# Patient Record
Sex: Female | Born: 1955
Health system: Southern US, Community
[De-identification: ages and names within clinical notes are randomized; demographics above are authoritative.]

## PROBLEM LIST (undated history)

## (undated) DIAGNOSIS — N182 Chronic kidney disease, stage 2 (mild): Secondary | ICD-10-CM

## (undated) DIAGNOSIS — I2510 Atherosclerotic heart disease of native coronary artery without angina pectoris: Secondary | ICD-10-CM

## (undated) DIAGNOSIS — E079 Disorder of thyroid, unspecified: Secondary | ICD-10-CM

## (undated) DIAGNOSIS — F17200 Nicotine dependence, unspecified, uncomplicated: Secondary | ICD-10-CM

## (undated) DIAGNOSIS — I219 Acute myocardial infarction, unspecified: Secondary | ICD-10-CM

## (undated) DIAGNOSIS — E2749 Other adrenocortical insufficiency: Secondary | ICD-10-CM

## (undated) DIAGNOSIS — E119 Type 2 diabetes mellitus without complications: Secondary | ICD-10-CM

## (undated) DIAGNOSIS — Z9981 Dependence on supplemental oxygen: Secondary | ICD-10-CM

## (undated) DIAGNOSIS — I509 Heart failure, unspecified: Secondary | ICD-10-CM

## (undated) DIAGNOSIS — J449 Chronic obstructive pulmonary disease, unspecified: Secondary | ICD-10-CM

## (undated) DIAGNOSIS — M1990 Unspecified osteoarthritis, unspecified site: Secondary | ICD-10-CM

## (undated) DIAGNOSIS — I10 Essential (primary) hypertension: Secondary | ICD-10-CM

## (undated) DIAGNOSIS — I5023 Acute on chronic systolic (congestive) heart failure: Secondary | ICD-10-CM

## (undated) DIAGNOSIS — E785 Hyperlipidemia, unspecified: Secondary | ICD-10-CM

## (undated) DIAGNOSIS — Z9289 Personal history of other medical treatment: Secondary | ICD-10-CM

## (undated) DIAGNOSIS — Z720 Tobacco use: Secondary | ICD-10-CM

## (undated) DIAGNOSIS — M3500 Sicca syndrome, unspecified: Secondary | ICD-10-CM

## (undated) DIAGNOSIS — E063 Autoimmune thyroiditis: Secondary | ICD-10-CM

## (undated) DIAGNOSIS — I5042 Chronic combined systolic (congestive) and diastolic (congestive) heart failure: Secondary | ICD-10-CM

## (undated) HISTORY — PX: SHOULDER ARTHROSCOPY: SHX128

## (undated) HISTORY — DX: Type 2 diabetes mellitus without complications: E11.9

## (undated) HISTORY — PX: HERNIA REPAIR: SHX51

## (undated) HISTORY — PX: ABDOMINAL SURGERY: SHX537

## (undated) HISTORY — DX: Disorder of thyroid, unspecified: E07.9

## (undated) HISTORY — DX: Hyperlipidemia, unspecified: E78.5

## (undated) HISTORY — PX: HERNIA MESH REMOVAL: SHX1745

## (undated) HISTORY — PX: COLON RESECTION: SHX5231

## (undated) HISTORY — PX: TUBAL LIGATION: SHX77

## (undated) HISTORY — DX: Essential (primary) hypertension: I10

## (undated) HISTORY — PX: CHOLECYSTECTOMY: SHX55

## (undated) HISTORY — DX: Personal history of other medical treatment: Z92.89

---

## 2008-01-11 DIAGNOSIS — IMO0002 Reserved for concepts with insufficient information to code with codable children: Secondary | ICD-10-CM | POA: Insufficient documentation

## 2008-01-11 DIAGNOSIS — M5137 Other intervertebral disc degeneration, lumbosacral region: Secondary | ICD-10-CM | POA: Insufficient documentation

## 2011-04-12 DIAGNOSIS — E2749 Other adrenocortical insufficiency: Secondary | ICD-10-CM | POA: Insufficient documentation

## 2013-06-14 DIAGNOSIS — G3184 Mild cognitive impairment, so stated: Secondary | ICD-10-CM | POA: Insufficient documentation

## 2013-06-14 DIAGNOSIS — G43019 Migraine without aura, intractable, without status migrainosus: Secondary | ICD-10-CM | POA: Insufficient documentation

## 2013-06-14 DIAGNOSIS — E1142 Type 2 diabetes mellitus with diabetic polyneuropathy: Secondary | ICD-10-CM | POA: Insufficient documentation

## 2013-06-14 DIAGNOSIS — M5126 Other intervertebral disc displacement, lumbar region: Secondary | ICD-10-CM | POA: Insufficient documentation

## 2013-07-26 ENCOUNTER — Encounter: Payer: Self-pay | Admitting: Gastroenterology

## 2013-09-17 DIAGNOSIS — F339 Major depressive disorder, recurrent, unspecified: Secondary | ICD-10-CM | POA: Insufficient documentation

## 2013-09-20 DIAGNOSIS — F17200 Nicotine dependence, unspecified, uncomplicated: Secondary | ICD-10-CM | POA: Insufficient documentation

## 2013-11-21 DIAGNOSIS — R0602 Shortness of breath: Secondary | ICD-10-CM | POA: Insufficient documentation

## 2013-11-21 DIAGNOSIS — M7989 Other specified soft tissue disorders: Secondary | ICD-10-CM | POA: Insufficient documentation

## 2013-11-21 DIAGNOSIS — E785 Hyperlipidemia, unspecified: Secondary | ICD-10-CM | POA: Insufficient documentation

## 2013-11-21 DIAGNOSIS — E236 Other disorders of pituitary gland: Secondary | ICD-10-CM | POA: Insufficient documentation

## 2013-11-21 DIAGNOSIS — M3500 Sicca syndrome, unspecified: Secondary | ICD-10-CM | POA: Insufficient documentation

## 2013-11-21 DIAGNOSIS — I429 Cardiomyopathy, unspecified: Secondary | ICD-10-CM | POA: Insufficient documentation

## 2013-11-21 DIAGNOSIS — D649 Anemia, unspecified: Secondary | ICD-10-CM | POA: Insufficient documentation

## 2013-11-21 DIAGNOSIS — K759 Inflammatory liver disease, unspecified: Secondary | ICD-10-CM | POA: Insufficient documentation

## 2013-11-21 DIAGNOSIS — E119 Type 2 diabetes mellitus without complications: Secondary | ICD-10-CM | POA: Insufficient documentation

## 2013-11-21 DIAGNOSIS — N289 Disorder of kidney and ureter, unspecified: Secondary | ICD-10-CM | POA: Insufficient documentation

## 2013-11-21 DIAGNOSIS — I10 Essential (primary) hypertension: Secondary | ICD-10-CM | POA: Insufficient documentation

## 2013-11-21 DIAGNOSIS — I509 Heart failure, unspecified: Secondary | ICD-10-CM | POA: Insufficient documentation

## 2013-11-21 DIAGNOSIS — N281 Cyst of kidney, acquired: Secondary | ICD-10-CM | POA: Insufficient documentation

## 2013-11-21 DIAGNOSIS — J439 Emphysema, unspecified: Secondary | ICD-10-CM | POA: Insufficient documentation

## 2013-11-21 DIAGNOSIS — E2740 Unspecified adrenocortical insufficiency: Secondary | ICD-10-CM | POA: Insufficient documentation

## 2013-11-21 DIAGNOSIS — R599 Enlarged lymph nodes, unspecified: Secondary | ICD-10-CM | POA: Insufficient documentation

## 2013-11-21 DIAGNOSIS — E079 Disorder of thyroid, unspecified: Secondary | ICD-10-CM | POA: Insufficient documentation

## 2013-11-21 DIAGNOSIS — R609 Edema, unspecified: Secondary | ICD-10-CM | POA: Insufficient documentation

## 2013-11-21 DIAGNOSIS — I200 Unstable angina: Secondary | ICD-10-CM | POA: Insufficient documentation

## 2013-11-21 DIAGNOSIS — I2510 Atherosclerotic heart disease of native coronary artery without angina pectoris: Secondary | ICD-10-CM | POA: Insufficient documentation

## 2014-10-16 DIAGNOSIS — Z955 Presence of coronary angioplasty implant and graft: Secondary | ICD-10-CM | POA: Insufficient documentation

## 2015-01-15 DIAGNOSIS — K430 Incisional hernia with obstruction, without gangrene: Secondary | ICD-10-CM | POA: Insufficient documentation

## 2015-01-15 DIAGNOSIS — G4730 Sleep apnea, unspecified: Secondary | ICD-10-CM | POA: Insufficient documentation

## 2015-01-18 DIAGNOSIS — K460 Unspecified abdominal hernia with obstruction, without gangrene: Secondary | ICD-10-CM | POA: Insufficient documentation

## 2015-01-18 DIAGNOSIS — E875 Hyperkalemia: Secondary | ICD-10-CM | POA: Insufficient documentation

## 2015-01-18 DIAGNOSIS — Z720 Tobacco use: Secondary | ICD-10-CM | POA: Insufficient documentation

## 2015-01-29 DIAGNOSIS — T8131XA Disruption of external operation (surgical) wound, not elsewhere classified, initial encounter: Secondary | ICD-10-CM | POA: Insufficient documentation

## 2015-01-29 DIAGNOSIS — Z9889 Other specified postprocedural states: Secondary | ICD-10-CM

## 2015-01-29 DIAGNOSIS — Z8719 Personal history of other diseases of the digestive system: Secondary | ICD-10-CM | POA: Insufficient documentation

## 2015-05-22 DIAGNOSIS — E039 Hypothyroidism, unspecified: Secondary | ICD-10-CM | POA: Insufficient documentation

## 2015-05-22 DIAGNOSIS — E876 Hypokalemia: Secondary | ICD-10-CM | POA: Insufficient documentation

## 2015-05-22 DIAGNOSIS — R079 Chest pain, unspecified: Secondary | ICD-10-CM | POA: Insufficient documentation

## 2015-06-25 DIAGNOSIS — J449 Chronic obstructive pulmonary disease, unspecified: Secondary | ICD-10-CM | POA: Diagnosis not present

## 2015-06-30 DIAGNOSIS — N200 Calculus of kidney: Secondary | ICD-10-CM | POA: Diagnosis not present

## 2015-06-30 DIAGNOSIS — R109 Unspecified abdominal pain: Secondary | ICD-10-CM | POA: Diagnosis not present

## 2015-06-30 DIAGNOSIS — Z9889 Other specified postprocedural states: Secondary | ICD-10-CM | POA: Diagnosis not present

## 2015-07-02 DIAGNOSIS — E1159 Type 2 diabetes mellitus with other circulatory complications: Secondary | ICD-10-CM | POA: Diagnosis not present

## 2015-07-02 DIAGNOSIS — E785 Hyperlipidemia, unspecified: Secondary | ICD-10-CM | POA: Diagnosis not present

## 2015-07-02 DIAGNOSIS — I10 Essential (primary) hypertension: Secondary | ICD-10-CM | POA: Diagnosis not present

## 2015-07-02 DIAGNOSIS — E039 Hypothyroidism, unspecified: Secondary | ICD-10-CM | POA: Diagnosis not present

## 2015-07-02 DIAGNOSIS — E119 Type 2 diabetes mellitus without complications: Secondary | ICD-10-CM | POA: Diagnosis not present

## 2015-07-15 DIAGNOSIS — T8189XD Other complications of procedures, not elsewhere classified, subsequent encounter: Secondary | ICD-10-CM | POA: Diagnosis not present

## 2015-07-15 DIAGNOSIS — Z6827 Body mass index (BMI) 27.0-27.9, adult: Secondary | ICD-10-CM | POA: Diagnosis not present

## 2015-07-15 DIAGNOSIS — I209 Angina pectoris, unspecified: Secondary | ICD-10-CM | POA: Diagnosis not present

## 2015-07-15 DIAGNOSIS — Z9889 Other specified postprocedural states: Secondary | ICD-10-CM | POA: Diagnosis not present

## 2015-07-15 DIAGNOSIS — I10 Essential (primary) hypertension: Secondary | ICD-10-CM | POA: Diagnosis not present

## 2015-07-15 DIAGNOSIS — I2510 Atherosclerotic heart disease of native coronary artery without angina pectoris: Secondary | ICD-10-CM | POA: Diagnosis not present

## 2015-07-15 DIAGNOSIS — Z8719 Personal history of other diseases of the digestive system: Secondary | ICD-10-CM | POA: Diagnosis not present

## 2015-07-17 DIAGNOSIS — J310 Chronic rhinitis: Secondary | ICD-10-CM | POA: Diagnosis not present

## 2015-07-26 DIAGNOSIS — J449 Chronic obstructive pulmonary disease, unspecified: Secondary | ICD-10-CM | POA: Diagnosis not present

## 2015-08-11 DIAGNOSIS — E119 Type 2 diabetes mellitus without complications: Secondary | ICD-10-CM | POA: Diagnosis not present

## 2015-08-14 DIAGNOSIS — J310 Chronic rhinitis: Secondary | ICD-10-CM | POA: Diagnosis not present

## 2015-08-25 DIAGNOSIS — J449 Chronic obstructive pulmonary disease, unspecified: Secondary | ICD-10-CM | POA: Diagnosis not present

## 2015-08-28 DIAGNOSIS — G4733 Obstructive sleep apnea (adult) (pediatric): Secondary | ICD-10-CM | POA: Diagnosis not present

## 2015-09-03 DIAGNOSIS — Z5181 Encounter for therapeutic drug level monitoring: Secondary | ICD-10-CM | POA: Diagnosis not present

## 2015-09-03 DIAGNOSIS — Z79899 Other long term (current) drug therapy: Secondary | ICD-10-CM | POA: Diagnosis not present

## 2015-09-03 DIAGNOSIS — R0602 Shortness of breath: Secondary | ICD-10-CM | POA: Diagnosis not present

## 2015-09-03 DIAGNOSIS — R918 Other nonspecific abnormal finding of lung field: Secondary | ICD-10-CM | POA: Diagnosis not present

## 2015-09-03 DIAGNOSIS — R600 Localized edema: Secondary | ICD-10-CM | POA: Diagnosis not present

## 2015-09-03 DIAGNOSIS — D649 Anemia, unspecified: Secondary | ICD-10-CM | POA: Diagnosis not present

## 2015-09-03 DIAGNOSIS — F329 Major depressive disorder, single episode, unspecified: Secondary | ICD-10-CM | POA: Diagnosis not present

## 2015-09-03 DIAGNOSIS — R0789 Other chest pain: Secondary | ICD-10-CM | POA: Diagnosis not present

## 2015-09-03 DIAGNOSIS — F4321 Adjustment disorder with depressed mood: Secondary | ICD-10-CM | POA: Diagnosis not present

## 2015-09-03 DIAGNOSIS — I10 Essential (primary) hypertension: Secondary | ICD-10-CM | POA: Diagnosis not present

## 2015-09-04 DIAGNOSIS — D649 Anemia, unspecified: Secondary | ICD-10-CM | POA: Diagnosis not present

## 2015-09-04 DIAGNOSIS — F329 Major depressive disorder, single episode, unspecified: Secondary | ICD-10-CM | POA: Diagnosis not present

## 2015-09-04 DIAGNOSIS — R0602 Shortness of breath: Secondary | ICD-10-CM | POA: Diagnosis not present

## 2015-09-04 DIAGNOSIS — R0789 Other chest pain: Secondary | ICD-10-CM | POA: Diagnosis not present

## 2015-09-04 DIAGNOSIS — I10 Essential (primary) hypertension: Secondary | ICD-10-CM | POA: Diagnosis not present

## 2015-09-14 DIAGNOSIS — E1165 Type 2 diabetes mellitus with hyperglycemia: Secondary | ICD-10-CM | POA: Diagnosis not present

## 2015-09-14 DIAGNOSIS — R079 Chest pain, unspecified: Secondary | ICD-10-CM | POA: Diagnosis not present

## 2015-09-14 DIAGNOSIS — D649 Anemia, unspecified: Secondary | ICD-10-CM | POA: Diagnosis not present

## 2015-09-14 DIAGNOSIS — R609 Edema, unspecified: Secondary | ICD-10-CM | POA: Diagnosis not present

## 2015-09-14 DIAGNOSIS — K648 Other hemorrhoids: Secondary | ICD-10-CM | POA: Diagnosis not present

## 2015-09-14 DIAGNOSIS — M3500 Sicca syndrome, unspecified: Secondary | ICD-10-CM | POA: Diagnosis not present

## 2015-09-14 DIAGNOSIS — R072 Precordial pain: Secondary | ICD-10-CM | POA: Diagnosis not present

## 2015-09-14 DIAGNOSIS — E271 Primary adrenocortical insufficiency: Secondary | ICD-10-CM | POA: Diagnosis not present

## 2015-09-14 DIAGNOSIS — E2740 Unspecified adrenocortical insufficiency: Secondary | ICD-10-CM | POA: Diagnosis not present

## 2015-09-14 DIAGNOSIS — R0789 Other chest pain: Secondary | ICD-10-CM | POA: Diagnosis not present

## 2015-09-14 DIAGNOSIS — I2510 Atherosclerotic heart disease of native coronary artery without angina pectoris: Secondary | ICD-10-CM | POA: Diagnosis not present

## 2015-09-15 DIAGNOSIS — R079 Chest pain, unspecified: Secondary | ICD-10-CM | POA: Diagnosis not present

## 2015-09-15 DIAGNOSIS — I10 Essential (primary) hypertension: Secondary | ICD-10-CM | POA: Diagnosis not present

## 2015-09-15 DIAGNOSIS — I2510 Atherosclerotic heart disease of native coronary artery without angina pectoris: Secondary | ICD-10-CM | POA: Diagnosis not present

## 2015-09-15 DIAGNOSIS — R072 Precordial pain: Secondary | ICD-10-CM | POA: Diagnosis not present

## 2015-09-15 DIAGNOSIS — Z6829 Body mass index (BMI) 29.0-29.9, adult: Secondary | ICD-10-CM | POA: Diagnosis not present

## 2015-09-15 DIAGNOSIS — E271 Primary adrenocortical insufficiency: Secondary | ICD-10-CM | POA: Diagnosis not present

## 2015-09-15 DIAGNOSIS — K648 Other hemorrhoids: Secondary | ICD-10-CM | POA: Diagnosis not present

## 2015-09-15 DIAGNOSIS — I209 Angina pectoris, unspecified: Secondary | ICD-10-CM | POA: Diagnosis not present

## 2015-09-15 DIAGNOSIS — Z955 Presence of coronary angioplasty implant and graft: Secondary | ICD-10-CM | POA: Diagnosis not present

## 2015-09-16 DIAGNOSIS — E1165 Type 2 diabetes mellitus with hyperglycemia: Secondary | ICD-10-CM | POA: Diagnosis not present

## 2015-09-16 DIAGNOSIS — K5730 Diverticulosis of large intestine without perforation or abscess without bleeding: Secondary | ICD-10-CM | POA: Diagnosis not present

## 2015-09-16 DIAGNOSIS — K922 Gastrointestinal hemorrhage, unspecified: Secondary | ICD-10-CM | POA: Insufficient documentation

## 2015-09-16 DIAGNOSIS — K648 Other hemorrhoids: Secondary | ICD-10-CM | POA: Diagnosis not present

## 2015-09-16 DIAGNOSIS — D500 Iron deficiency anemia secondary to blood loss (chronic): Secondary | ICD-10-CM | POA: Diagnosis not present

## 2015-09-16 DIAGNOSIS — F329 Major depressive disorder, single episode, unspecified: Secondary | ICD-10-CM | POA: Diagnosis not present

## 2015-09-16 DIAGNOSIS — R072 Precordial pain: Secondary | ICD-10-CM | POA: Diagnosis not present

## 2015-09-16 DIAGNOSIS — D509 Iron deficiency anemia, unspecified: Secondary | ICD-10-CM | POA: Diagnosis not present

## 2015-09-16 DIAGNOSIS — Z6830 Body mass index (BMI) 30.0-30.9, adult: Secondary | ICD-10-CM | POA: Diagnosis not present

## 2015-09-16 DIAGNOSIS — G4733 Obstructive sleep apnea (adult) (pediatric): Secondary | ICD-10-CM | POA: Diagnosis not present

## 2015-09-16 DIAGNOSIS — E2740 Unspecified adrenocortical insufficiency: Secondary | ICD-10-CM | POA: Diagnosis not present

## 2015-09-16 DIAGNOSIS — E785 Hyperlipidemia, unspecified: Secondary | ICD-10-CM | POA: Diagnosis not present

## 2015-09-16 DIAGNOSIS — I110 Hypertensive heart disease with heart failure: Secondary | ICD-10-CM | POA: Diagnosis not present

## 2015-09-16 DIAGNOSIS — E271 Primary adrenocortical insufficiency: Secondary | ICD-10-CM | POA: Diagnosis not present

## 2015-09-16 DIAGNOSIS — I252 Old myocardial infarction: Secondary | ICD-10-CM | POA: Diagnosis not present

## 2015-09-16 DIAGNOSIS — Z8701 Personal history of pneumonia (recurrent): Secondary | ICD-10-CM | POA: Diagnosis not present

## 2015-09-16 DIAGNOSIS — Z8673 Personal history of transient ischemic attack (TIA), and cerebral infarction without residual deficits: Secondary | ICD-10-CM | POA: Diagnosis not present

## 2015-09-16 DIAGNOSIS — I209 Angina pectoris, unspecified: Secondary | ICD-10-CM | POA: Diagnosis not present

## 2015-09-16 DIAGNOSIS — Z9981 Dependence on supplemental oxygen: Secondary | ICD-10-CM | POA: Diagnosis not present

## 2015-09-16 DIAGNOSIS — K625 Hemorrhage of anus and rectum: Secondary | ICD-10-CM | POA: Diagnosis not present

## 2015-09-16 DIAGNOSIS — Z955 Presence of coronary angioplasty implant and graft: Secondary | ICD-10-CM | POA: Diagnosis not present

## 2015-09-16 DIAGNOSIS — R0789 Other chest pain: Secondary | ICD-10-CM | POA: Diagnosis not present

## 2015-09-16 DIAGNOSIS — J449 Chronic obstructive pulmonary disease, unspecified: Secondary | ICD-10-CM | POA: Diagnosis not present

## 2015-09-16 DIAGNOSIS — F17210 Nicotine dependence, cigarettes, uncomplicated: Secondary | ICD-10-CM | POA: Diagnosis not present

## 2015-09-16 DIAGNOSIS — K644 Residual hemorrhoidal skin tags: Secondary | ICD-10-CM | POA: Diagnosis not present

## 2015-09-17 DIAGNOSIS — R072 Precordial pain: Secondary | ICD-10-CM | POA: Diagnosis not present

## 2015-09-17 DIAGNOSIS — E271 Primary adrenocortical insufficiency: Secondary | ICD-10-CM | POA: Diagnosis not present

## 2015-09-17 DIAGNOSIS — K648 Other hemorrhoids: Secondary | ICD-10-CM | POA: Diagnosis not present

## 2015-09-17 DIAGNOSIS — D509 Iron deficiency anemia, unspecified: Secondary | ICD-10-CM | POA: Diagnosis not present

## 2015-09-17 DIAGNOSIS — R0789 Other chest pain: Secondary | ICD-10-CM | POA: Diagnosis not present

## 2015-09-17 DIAGNOSIS — D500 Iron deficiency anemia secondary to blood loss (chronic): Secondary | ICD-10-CM | POA: Diagnosis not present

## 2015-09-17 DIAGNOSIS — Z6830 Body mass index (BMI) 30.0-30.9, adult: Secondary | ICD-10-CM | POA: Diagnosis not present

## 2015-09-22 DIAGNOSIS — K625 Hemorrhage of anus and rectum: Secondary | ICD-10-CM | POA: Diagnosis not present

## 2015-09-22 DIAGNOSIS — I10 Essential (primary) hypertension: Secondary | ICD-10-CM | POA: Diagnosis not present

## 2015-09-22 DIAGNOSIS — I209 Angina pectoris, unspecified: Secondary | ICD-10-CM | POA: Diagnosis not present

## 2015-09-22 DIAGNOSIS — R0602 Shortness of breath: Secondary | ICD-10-CM | POA: Diagnosis not present

## 2015-09-23 DIAGNOSIS — Z1389 Encounter for screening for other disorder: Secondary | ICD-10-CM | POA: Diagnosis not present

## 2015-09-23 DIAGNOSIS — I110 Hypertensive heart disease with heart failure: Secondary | ICD-10-CM | POA: Diagnosis not present

## 2015-09-23 DIAGNOSIS — D649 Anemia, unspecified: Secondary | ICD-10-CM | POA: Diagnosis not present

## 2015-09-23 DIAGNOSIS — E538 Deficiency of other specified B group vitamins: Secondary | ICD-10-CM | POA: Diagnosis not present

## 2015-09-25 DIAGNOSIS — J449 Chronic obstructive pulmonary disease, unspecified: Secondary | ICD-10-CM | POA: Diagnosis not present

## 2015-09-30 DIAGNOSIS — R110 Nausea: Secondary | ICD-10-CM | POA: Diagnosis not present

## 2015-09-30 DIAGNOSIS — I110 Hypertensive heart disease with heart failure: Secondary | ICD-10-CM | POA: Diagnosis not present

## 2015-09-30 DIAGNOSIS — Z6829 Body mass index (BMI) 29.0-29.9, adult: Secondary | ICD-10-CM | POA: Diagnosis not present

## 2015-09-30 DIAGNOSIS — D649 Anemia, unspecified: Secondary | ICD-10-CM | POA: Diagnosis not present

## 2015-10-05 DIAGNOSIS — D509 Iron deficiency anemia, unspecified: Secondary | ICD-10-CM | POA: Diagnosis not present

## 2015-10-08 DIAGNOSIS — E1159 Type 2 diabetes mellitus with other circulatory complications: Secondary | ICD-10-CM | POA: Diagnosis not present

## 2015-10-08 DIAGNOSIS — E119 Type 2 diabetes mellitus without complications: Secondary | ICD-10-CM | POA: Diagnosis not present

## 2015-10-08 DIAGNOSIS — E039 Hypothyroidism, unspecified: Secondary | ICD-10-CM | POA: Diagnosis not present

## 2015-10-08 DIAGNOSIS — E785 Hyperlipidemia, unspecified: Secondary | ICD-10-CM | POA: Diagnosis not present

## 2015-10-12 DIAGNOSIS — D509 Iron deficiency anemia, unspecified: Secondary | ICD-10-CM | POA: Diagnosis not present

## 2015-10-15 DIAGNOSIS — I10 Essential (primary) hypertension: Secondary | ICD-10-CM | POA: Diagnosis not present

## 2015-10-15 DIAGNOSIS — E1159 Type 2 diabetes mellitus with other circulatory complications: Secondary | ICD-10-CM | POA: Diagnosis not present

## 2015-10-15 DIAGNOSIS — E785 Hyperlipidemia, unspecified: Secondary | ICD-10-CM | POA: Diagnosis not present

## 2015-10-15 DIAGNOSIS — E119 Type 2 diabetes mellitus without complications: Secondary | ICD-10-CM | POA: Diagnosis not present

## 2015-10-21 DIAGNOSIS — E119 Type 2 diabetes mellitus without complications: Secondary | ICD-10-CM | POA: Diagnosis not present

## 2015-10-21 DIAGNOSIS — E785 Hyperlipidemia, unspecified: Secondary | ICD-10-CM | POA: Diagnosis not present

## 2015-10-21 DIAGNOSIS — E039 Hypothyroidism, unspecified: Secondary | ICD-10-CM | POA: Diagnosis not present

## 2015-10-21 DIAGNOSIS — E1159 Type 2 diabetes mellitus with other circulatory complications: Secondary | ICD-10-CM | POA: Diagnosis not present

## 2015-10-22 DIAGNOSIS — E063 Autoimmune thyroiditis: Secondary | ICD-10-CM | POA: Diagnosis not present

## 2015-10-22 DIAGNOSIS — G43019 Migraine without aura, intractable, without status migrainosus: Secondary | ICD-10-CM | POA: Diagnosis not present

## 2015-10-22 DIAGNOSIS — R6889 Other general symptoms and signs: Secondary | ICD-10-CM | POA: Diagnosis not present

## 2015-10-24 DIAGNOSIS — R42 Dizziness and giddiness: Secondary | ICD-10-CM | POA: Diagnosis not present

## 2015-10-24 DIAGNOSIS — I509 Heart failure, unspecified: Secondary | ICD-10-CM | POA: Diagnosis not present

## 2015-10-24 DIAGNOSIS — I10 Essential (primary) hypertension: Secondary | ICD-10-CM | POA: Diagnosis not present

## 2015-10-24 DIAGNOSIS — J449 Chronic obstructive pulmonary disease, unspecified: Secondary | ICD-10-CM | POA: Diagnosis not present

## 2015-10-24 DIAGNOSIS — I429 Cardiomyopathy, unspecified: Secondary | ICD-10-CM | POA: Diagnosis not present

## 2015-10-24 DIAGNOSIS — I2510 Atherosclerotic heart disease of native coronary artery without angina pectoris: Secondary | ICD-10-CM | POA: Diagnosis not present

## 2015-10-24 DIAGNOSIS — R110 Nausea: Secondary | ICD-10-CM | POA: Diagnosis not present

## 2015-10-24 DIAGNOSIS — I252 Old myocardial infarction: Secondary | ICD-10-CM | POA: Diagnosis not present

## 2015-10-24 DIAGNOSIS — E785 Hyperlipidemia, unspecified: Secondary | ICD-10-CM | POA: Diagnosis not present

## 2015-10-24 DIAGNOSIS — Z8673 Personal history of transient ischemic attack (TIA), and cerebral infarction without residual deficits: Secondary | ICD-10-CM | POA: Diagnosis not present

## 2015-10-24 DIAGNOSIS — F17210 Nicotine dependence, cigarettes, uncomplicated: Secondary | ICD-10-CM | POA: Diagnosis not present

## 2015-10-24 DIAGNOSIS — Z8249 Family history of ischemic heart disease and other diseases of the circulatory system: Secondary | ICD-10-CM | POA: Diagnosis not present

## 2015-10-24 DIAGNOSIS — E119 Type 2 diabetes mellitus without complications: Secondary | ICD-10-CM | POA: Diagnosis not present

## 2015-10-24 DIAGNOSIS — Z9981 Dependence on supplemental oxygen: Secondary | ICD-10-CM | POA: Diagnosis not present

## 2015-10-24 DIAGNOSIS — R413 Other amnesia: Secondary | ICD-10-CM | POA: Diagnosis not present

## 2015-10-24 DIAGNOSIS — D649 Anemia, unspecified: Secondary | ICD-10-CM | POA: Diagnosis not present

## 2015-10-24 DIAGNOSIS — R0602 Shortness of breath: Secondary | ICD-10-CM | POA: Diagnosis not present

## 2015-10-24 DIAGNOSIS — M791 Myalgia: Secondary | ICD-10-CM | POA: Diagnosis not present

## 2015-10-24 DIAGNOSIS — Z955 Presence of coronary angioplasty implant and graft: Secondary | ICD-10-CM | POA: Diagnosis not present

## 2015-10-25 DIAGNOSIS — J449 Chronic obstructive pulmonary disease, unspecified: Secondary | ICD-10-CM | POA: Diagnosis not present

## 2015-10-27 DIAGNOSIS — I110 Hypertensive heart disease with heart failure: Secondary | ICD-10-CM | POA: Diagnosis not present

## 2015-10-27 DIAGNOSIS — E1159 Type 2 diabetes mellitus with other circulatory complications: Secondary | ICD-10-CM | POA: Diagnosis not present

## 2015-10-27 DIAGNOSIS — I10 Essential (primary) hypertension: Secondary | ICD-10-CM | POA: Diagnosis not present

## 2015-10-27 DIAGNOSIS — E039 Hypothyroidism, unspecified: Secondary | ICD-10-CM | POA: Diagnosis not present

## 2015-10-28 DIAGNOSIS — Z8719 Personal history of other diseases of the digestive system: Secondary | ICD-10-CM | POA: Diagnosis not present

## 2015-10-28 DIAGNOSIS — R1901 Right upper quadrant abdominal swelling, mass and lump: Secondary | ICD-10-CM | POA: Diagnosis not present

## 2015-10-28 DIAGNOSIS — Z9889 Other specified postprocedural states: Secondary | ICD-10-CM | POA: Diagnosis not present

## 2015-10-28 DIAGNOSIS — T8189XD Other complications of procedures, not elsewhere classified, subsequent encounter: Secondary | ICD-10-CM | POA: Diagnosis not present

## 2015-10-30 DIAGNOSIS — R1909 Other intra-abdominal and pelvic swelling, mass and lump: Secondary | ICD-10-CM | POA: Diagnosis not present

## 2015-10-30 DIAGNOSIS — R1901 Right upper quadrant abdominal swelling, mass and lump: Secondary | ICD-10-CM | POA: Diagnosis not present

## 2015-10-30 DIAGNOSIS — Z9049 Acquired absence of other specified parts of digestive tract: Secondary | ICD-10-CM | POA: Diagnosis not present

## 2015-10-30 DIAGNOSIS — D1803 Hemangioma of intra-abdominal structures: Secondary | ICD-10-CM | POA: Diagnosis not present

## 2015-10-30 DIAGNOSIS — N200 Calculus of kidney: Secondary | ICD-10-CM | POA: Diagnosis not present

## 2015-10-30 DIAGNOSIS — Z9889 Other specified postprocedural states: Secondary | ICD-10-CM | POA: Diagnosis not present

## 2015-11-02 DIAGNOSIS — Z9889 Other specified postprocedural states: Secondary | ICD-10-CM | POA: Diagnosis not present

## 2015-11-02 DIAGNOSIS — Z6831 Body mass index (BMI) 31.0-31.9, adult: Secondary | ICD-10-CM | POA: Diagnosis not present

## 2015-11-02 DIAGNOSIS — R1901 Right upper quadrant abdominal swelling, mass and lump: Secondary | ICD-10-CM | POA: Diagnosis not present

## 2015-11-02 DIAGNOSIS — Z8719 Personal history of other diseases of the digestive system: Secondary | ICD-10-CM | POA: Diagnosis not present

## 2015-11-04 DIAGNOSIS — R404 Transient alteration of awareness: Secondary | ICD-10-CM | POA: Diagnosis not present

## 2015-11-04 DIAGNOSIS — R6889 Other general symptoms and signs: Secondary | ICD-10-CM | POA: Diagnosis not present

## 2015-11-10 DIAGNOSIS — D649 Anemia, unspecified: Secondary | ICD-10-CM | POA: Diagnosis not present

## 2015-11-11 DIAGNOSIS — E1159 Type 2 diabetes mellitus with other circulatory complications: Secondary | ICD-10-CM | POA: Diagnosis not present

## 2015-11-11 DIAGNOSIS — D649 Anemia, unspecified: Secondary | ICD-10-CM | POA: Diagnosis not present

## 2015-11-13 DIAGNOSIS — J310 Chronic rhinitis: Secondary | ICD-10-CM | POA: Diagnosis not present

## 2015-11-17 DIAGNOSIS — D649 Anemia, unspecified: Secondary | ICD-10-CM | POA: Diagnosis not present

## 2015-11-17 DIAGNOSIS — Z6832 Body mass index (BMI) 32.0-32.9, adult: Secondary | ICD-10-CM | POA: Diagnosis not present

## 2015-11-17 DIAGNOSIS — I10 Essential (primary) hypertension: Secondary | ICD-10-CM | POA: Diagnosis not present

## 2015-11-17 DIAGNOSIS — E1159 Type 2 diabetes mellitus with other circulatory complications: Secondary | ICD-10-CM | POA: Diagnosis not present

## 2015-11-25 DIAGNOSIS — J449 Chronic obstructive pulmonary disease, unspecified: Secondary | ICD-10-CM | POA: Diagnosis not present

## 2015-11-27 DIAGNOSIS — G4733 Obstructive sleep apnea (adult) (pediatric): Secondary | ICD-10-CM | POA: Diagnosis not present

## 2015-11-27 DIAGNOSIS — J449 Chronic obstructive pulmonary disease, unspecified: Secondary | ICD-10-CM | POA: Diagnosis not present

## 2015-11-27 DIAGNOSIS — J310 Chronic rhinitis: Secondary | ICD-10-CM | POA: Diagnosis not present

## 2015-11-27 DIAGNOSIS — R911 Solitary pulmonary nodule: Secondary | ICD-10-CM | POA: Diagnosis not present

## 2015-12-01 DIAGNOSIS — Z6834 Body mass index (BMI) 34.0-34.9, adult: Secondary | ICD-10-CM | POA: Diagnosis not present

## 2015-12-01 DIAGNOSIS — I25119 Atherosclerotic heart disease of native coronary artery with unspecified angina pectoris: Secondary | ICD-10-CM | POA: Diagnosis not present

## 2015-12-01 DIAGNOSIS — R079 Chest pain, unspecified: Secondary | ICD-10-CM | POA: Diagnosis not present

## 2015-12-01 DIAGNOSIS — R0602 Shortness of breath: Secondary | ICD-10-CM | POA: Diagnosis not present

## 2015-12-01 DIAGNOSIS — R0789 Other chest pain: Secondary | ICD-10-CM | POA: Diagnosis not present

## 2015-12-01 DIAGNOSIS — E2740 Unspecified adrenocortical insufficiency: Secondary | ICD-10-CM | POA: Diagnosis not present

## 2015-12-01 DIAGNOSIS — F17210 Nicotine dependence, cigarettes, uncomplicated: Secondary | ICD-10-CM | POA: Diagnosis not present

## 2015-12-01 DIAGNOSIS — Z6832 Body mass index (BMI) 32.0-32.9, adult: Secondary | ICD-10-CM | POA: Diagnosis not present

## 2015-12-01 DIAGNOSIS — R0609 Other forms of dyspnea: Secondary | ICD-10-CM | POA: Diagnosis not present

## 2015-12-01 DIAGNOSIS — I209 Angina pectoris, unspecified: Secondary | ICD-10-CM | POA: Diagnosis not present

## 2015-12-01 DIAGNOSIS — I2510 Atherosclerotic heart disease of native coronary artery without angina pectoris: Secondary | ICD-10-CM | POA: Diagnosis not present

## 2015-12-01 DIAGNOSIS — I429 Cardiomyopathy, unspecified: Secondary | ICD-10-CM | POA: Diagnosis not present

## 2015-12-02 DIAGNOSIS — I252 Old myocardial infarction: Secondary | ICD-10-CM | POA: Diagnosis not present

## 2015-12-02 DIAGNOSIS — Z7951 Long term (current) use of inhaled steroids: Secondary | ICD-10-CM | POA: Diagnosis not present

## 2015-12-02 DIAGNOSIS — F329 Major depressive disorder, single episode, unspecified: Secondary | ICD-10-CM | POA: Diagnosis not present

## 2015-12-02 DIAGNOSIS — Z955 Presence of coronary angioplasty implant and graft: Secondary | ICD-10-CM | POA: Diagnosis not present

## 2015-12-02 DIAGNOSIS — R0609 Other forms of dyspnea: Secondary | ICD-10-CM | POA: Diagnosis not present

## 2015-12-02 DIAGNOSIS — Z9981 Dependence on supplemental oxygen: Secondary | ICD-10-CM | POA: Diagnosis not present

## 2015-12-02 DIAGNOSIS — G4733 Obstructive sleep apnea (adult) (pediatric): Secondary | ICD-10-CM | POA: Diagnosis not present

## 2015-12-02 DIAGNOSIS — Z8249 Family history of ischemic heart disease and other diseases of the circulatory system: Secondary | ICD-10-CM | POA: Diagnosis not present

## 2015-12-02 DIAGNOSIS — Z6832 Body mass index (BMI) 32.0-32.9, adult: Secondary | ICD-10-CM | POA: Diagnosis not present

## 2015-12-02 DIAGNOSIS — F17210 Nicotine dependence, cigarettes, uncomplicated: Secondary | ICD-10-CM | POA: Diagnosis not present

## 2015-12-02 DIAGNOSIS — E785 Hyperlipidemia, unspecified: Secondary | ICD-10-CM | POA: Diagnosis not present

## 2015-12-02 DIAGNOSIS — E118 Type 2 diabetes mellitus with unspecified complications: Secondary | ICD-10-CM | POA: Diagnosis not present

## 2015-12-02 DIAGNOSIS — I5020 Unspecified systolic (congestive) heart failure: Secondary | ICD-10-CM | POA: Diagnosis not present

## 2015-12-02 DIAGNOSIS — I2510 Atherosclerotic heart disease of native coronary artery without angina pectoris: Secondary | ICD-10-CM | POA: Diagnosis not present

## 2015-12-02 DIAGNOSIS — J449 Chronic obstructive pulmonary disease, unspecified: Secondary | ICD-10-CM | POA: Diagnosis not present

## 2015-12-02 DIAGNOSIS — R0789 Other chest pain: Secondary | ICD-10-CM | POA: Diagnosis not present

## 2015-12-02 DIAGNOSIS — I493 Ventricular premature depolarization: Secondary | ICD-10-CM | POA: Diagnosis not present

## 2015-12-02 DIAGNOSIS — R079 Chest pain, unspecified: Secondary | ICD-10-CM | POA: Diagnosis not present

## 2015-12-02 DIAGNOSIS — I209 Angina pectoris, unspecified: Secondary | ICD-10-CM | POA: Diagnosis not present

## 2015-12-02 DIAGNOSIS — E038 Other specified hypothyroidism: Secondary | ICD-10-CM | POA: Diagnosis not present

## 2015-12-02 DIAGNOSIS — R0602 Shortness of breath: Secondary | ICD-10-CM | POA: Diagnosis not present

## 2015-12-02 DIAGNOSIS — I429 Cardiomyopathy, unspecified: Secondary | ICD-10-CM | POA: Diagnosis not present

## 2015-12-02 DIAGNOSIS — E2740 Unspecified adrenocortical insufficiency: Secondary | ICD-10-CM | POA: Diagnosis not present

## 2015-12-02 DIAGNOSIS — I110 Hypertensive heart disease with heart failure: Secondary | ICD-10-CM | POA: Diagnosis not present

## 2015-12-02 DIAGNOSIS — I501 Left ventricular failure: Secondary | ICD-10-CM | POA: Diagnosis not present

## 2015-12-02 DIAGNOSIS — I5022 Chronic systolic (congestive) heart failure: Secondary | ICD-10-CM | POA: Diagnosis not present

## 2015-12-02 DIAGNOSIS — I25119 Atherosclerotic heart disease of native coronary artery with unspecified angina pectoris: Secondary | ICD-10-CM | POA: Diagnosis not present

## 2015-12-02 DIAGNOSIS — E119 Type 2 diabetes mellitus without complications: Secondary | ICD-10-CM | POA: Diagnosis not present

## 2015-12-02 DIAGNOSIS — Z780 Asymptomatic menopausal state: Secondary | ICD-10-CM | POA: Diagnosis not present

## 2015-12-03 DIAGNOSIS — E038 Other specified hypothyroidism: Secondary | ICD-10-CM | POA: Diagnosis not present

## 2015-12-03 DIAGNOSIS — R079 Chest pain, unspecified: Secondary | ICD-10-CM | POA: Diagnosis not present

## 2015-12-03 DIAGNOSIS — E2740 Unspecified adrenocortical insufficiency: Secondary | ICD-10-CM | POA: Diagnosis not present

## 2015-12-03 DIAGNOSIS — I25119 Atherosclerotic heart disease of native coronary artery with unspecified angina pectoris: Secondary | ICD-10-CM | POA: Diagnosis not present

## 2015-12-03 DIAGNOSIS — I5020 Unspecified systolic (congestive) heart failure: Secondary | ICD-10-CM | POA: Diagnosis not present

## 2015-12-03 DIAGNOSIS — I209 Angina pectoris, unspecified: Secondary | ICD-10-CM | POA: Diagnosis not present

## 2015-12-03 DIAGNOSIS — I2510 Atherosclerotic heart disease of native coronary artery without angina pectoris: Secondary | ICD-10-CM | POA: Diagnosis not present

## 2015-12-03 DIAGNOSIS — R0789 Other chest pain: Secondary | ICD-10-CM | POA: Diagnosis not present

## 2015-12-03 DIAGNOSIS — R0609 Other forms of dyspnea: Secondary | ICD-10-CM | POA: Diagnosis not present

## 2015-12-08 DIAGNOSIS — Z8719 Personal history of other diseases of the digestive system: Secondary | ICD-10-CM | POA: Diagnosis not present

## 2015-12-08 DIAGNOSIS — K625 Hemorrhage of anus and rectum: Secondary | ICD-10-CM | POA: Diagnosis not present

## 2015-12-09 DIAGNOSIS — I10 Essential (primary) hypertension: Secondary | ICD-10-CM | POA: Diagnosis not present

## 2015-12-09 DIAGNOSIS — D649 Anemia, unspecified: Secondary | ICD-10-CM | POA: Diagnosis not present

## 2015-12-09 DIAGNOSIS — I110 Hypertensive heart disease with heart failure: Secondary | ICD-10-CM | POA: Diagnosis not present

## 2015-12-09 DIAGNOSIS — E1159 Type 2 diabetes mellitus with other circulatory complications: Secondary | ICD-10-CM | POA: Diagnosis not present

## 2015-12-11 DIAGNOSIS — J310 Chronic rhinitis: Secondary | ICD-10-CM | POA: Diagnosis not present

## 2015-12-14 DIAGNOSIS — E669 Obesity, unspecified: Secondary | ICD-10-CM | POA: Diagnosis not present

## 2015-12-14 DIAGNOSIS — Z8719 Personal history of other diseases of the digestive system: Secondary | ICD-10-CM | POA: Diagnosis not present

## 2015-12-14 DIAGNOSIS — Z9889 Other specified postprocedural states: Secondary | ICD-10-CM | POA: Diagnosis not present

## 2015-12-14 DIAGNOSIS — E118 Type 2 diabetes mellitus with unspecified complications: Secondary | ICD-10-CM | POA: Diagnosis not present

## 2015-12-15 DIAGNOSIS — H43812 Vitreous degeneration, left eye: Secondary | ICD-10-CM | POA: Diagnosis not present

## 2015-12-23 DIAGNOSIS — E063 Autoimmune thyroiditis: Secondary | ICD-10-CM | POA: Diagnosis not present

## 2015-12-23 DIAGNOSIS — M3500 Sicca syndrome, unspecified: Secondary | ICD-10-CM | POA: Diagnosis not present

## 2015-12-23 DIAGNOSIS — G43019 Migraine without aura, intractable, without status migrainosus: Secondary | ICD-10-CM | POA: Diagnosis not present

## 2015-12-23 DIAGNOSIS — G2581 Restless legs syndrome: Secondary | ICD-10-CM | POA: Diagnosis not present

## 2015-12-25 DIAGNOSIS — J310 Chronic rhinitis: Secondary | ICD-10-CM | POA: Diagnosis not present

## 2015-12-26 DIAGNOSIS — J449 Chronic obstructive pulmonary disease, unspecified: Secondary | ICD-10-CM | POA: Diagnosis not present

## 2016-01-08 DIAGNOSIS — J310 Chronic rhinitis: Secondary | ICD-10-CM | POA: Diagnosis not present

## 2016-01-11 DIAGNOSIS — Z6834 Body mass index (BMI) 34.0-34.9, adult: Secondary | ICD-10-CM | POA: Diagnosis not present

## 2016-01-11 DIAGNOSIS — G4700 Insomnia, unspecified: Secondary | ICD-10-CM | POA: Diagnosis not present

## 2016-01-11 DIAGNOSIS — I110 Hypertensive heart disease with heart failure: Secondary | ICD-10-CM | POA: Diagnosis not present

## 2016-01-11 DIAGNOSIS — R9430 Abnormal result of cardiovascular function study, unspecified: Secondary | ICD-10-CM | POA: Diagnosis not present

## 2016-01-11 DIAGNOSIS — Z23 Encounter for immunization: Secondary | ICD-10-CM | POA: Diagnosis not present

## 2016-01-12 DIAGNOSIS — R911 Solitary pulmonary nodule: Secondary | ICD-10-CM | POA: Diagnosis not present

## 2016-01-13 DIAGNOSIS — E118 Type 2 diabetes mellitus with unspecified complications: Secondary | ICD-10-CM | POA: Diagnosis not present

## 2016-01-13 DIAGNOSIS — I420 Dilated cardiomyopathy: Secondary | ICD-10-CM | POA: Diagnosis not present

## 2016-01-13 DIAGNOSIS — E7800 Pure hypercholesterolemia, unspecified: Secondary | ICD-10-CM | POA: Diagnosis not present

## 2016-01-13 DIAGNOSIS — Z955 Presence of coronary angioplasty implant and graft: Secondary | ICD-10-CM | POA: Diagnosis not present

## 2016-01-15 DIAGNOSIS — R911 Solitary pulmonary nodule: Secondary | ICD-10-CM | POA: Diagnosis not present

## 2016-01-15 DIAGNOSIS — J310 Chronic rhinitis: Secondary | ICD-10-CM | POA: Diagnosis not present

## 2016-01-15 DIAGNOSIS — F17210 Nicotine dependence, cigarettes, uncomplicated: Secondary | ICD-10-CM | POA: Diagnosis not present

## 2016-01-15 DIAGNOSIS — J449 Chronic obstructive pulmonary disease, unspecified: Secondary | ICD-10-CM | POA: Diagnosis not present

## 2016-01-25 DIAGNOSIS — J449 Chronic obstructive pulmonary disease, unspecified: Secondary | ICD-10-CM | POA: Diagnosis not present

## 2016-01-27 DIAGNOSIS — I255 Ischemic cardiomyopathy: Secondary | ICD-10-CM | POA: Diagnosis not present

## 2016-01-27 DIAGNOSIS — I5022 Chronic systolic (congestive) heart failure: Secondary | ICD-10-CM | POA: Diagnosis not present

## 2016-01-27 DIAGNOSIS — E782 Mixed hyperlipidemia: Secondary | ICD-10-CM | POA: Diagnosis not present

## 2016-01-27 DIAGNOSIS — I2510 Atherosclerotic heart disease of native coronary artery without angina pectoris: Secondary | ICD-10-CM | POA: Diagnosis not present

## 2016-02-01 DIAGNOSIS — J310 Chronic rhinitis: Secondary | ICD-10-CM | POA: Diagnosis not present

## 2016-02-08 DIAGNOSIS — I5022 Chronic systolic (congestive) heart failure: Secondary | ICD-10-CM | POA: Diagnosis not present

## 2016-02-08 DIAGNOSIS — I2510 Atherosclerotic heart disease of native coronary artery without angina pectoris: Secondary | ICD-10-CM | POA: Diagnosis not present

## 2016-02-08 DIAGNOSIS — I5042 Chronic combined systolic (congestive) and diastolic (congestive) heart failure: Secondary | ICD-10-CM | POA: Diagnosis not present

## 2016-02-08 DIAGNOSIS — E782 Mixed hyperlipidemia: Secondary | ICD-10-CM | POA: Diagnosis not present

## 2016-02-08 DIAGNOSIS — R0602 Shortness of breath: Secondary | ICD-10-CM | POA: Diagnosis not present

## 2016-02-08 DIAGNOSIS — I255 Ischemic cardiomyopathy: Secondary | ICD-10-CM | POA: Diagnosis not present

## 2016-02-10 DIAGNOSIS — T792XXD Traumatic secondary and recurrent hemorrhage and seroma, subsequent encounter: Secondary | ICD-10-CM | POA: Diagnosis not present

## 2016-02-10 DIAGNOSIS — H43812 Vitreous degeneration, left eye: Secondary | ICD-10-CM | POA: Diagnosis not present

## 2016-02-10 DIAGNOSIS — T888XXD Other specified complications of surgical and medical care, not elsewhere classified, subsequent encounter: Secondary | ICD-10-CM | POA: Diagnosis not present

## 2016-02-11 DIAGNOSIS — G4700 Insomnia, unspecified: Secondary | ICD-10-CM | POA: Diagnosis not present

## 2016-02-11 DIAGNOSIS — E119 Type 2 diabetes mellitus without complications: Secondary | ICD-10-CM | POA: Diagnosis not present

## 2016-02-11 DIAGNOSIS — Z6834 Body mass index (BMI) 34.0-34.9, adult: Secondary | ICD-10-CM | POA: Diagnosis not present

## 2016-02-23 DIAGNOSIS — R911 Solitary pulmonary nodule: Secondary | ICD-10-CM | POA: Diagnosis not present

## 2016-02-23 DIAGNOSIS — F17210 Nicotine dependence, cigarettes, uncomplicated: Secondary | ICD-10-CM | POA: Diagnosis not present

## 2016-02-23 DIAGNOSIS — J310 Chronic rhinitis: Secondary | ICD-10-CM | POA: Diagnosis not present

## 2016-02-23 DIAGNOSIS — J449 Chronic obstructive pulmonary disease, unspecified: Secondary | ICD-10-CM | POA: Diagnosis not present

## 2016-02-23 DIAGNOSIS — G4733 Obstructive sleep apnea (adult) (pediatric): Secondary | ICD-10-CM | POA: Diagnosis not present

## 2016-02-25 DIAGNOSIS — M1712 Unilateral primary osteoarthritis, left knee: Secondary | ICD-10-CM | POA: Diagnosis not present

## 2016-02-25 DIAGNOSIS — J449 Chronic obstructive pulmonary disease, unspecified: Secondary | ICD-10-CM | POA: Diagnosis not present

## 2016-02-25 DIAGNOSIS — M25562 Pain in left knee: Secondary | ICD-10-CM | POA: Diagnosis not present

## 2016-02-29 DIAGNOSIS — G4733 Obstructive sleep apnea (adult) (pediatric): Secondary | ICD-10-CM | POA: Diagnosis not present

## 2016-03-07 DIAGNOSIS — I420 Dilated cardiomyopathy: Secondary | ICD-10-CM | POA: Diagnosis not present

## 2016-03-07 DIAGNOSIS — Z6833 Body mass index (BMI) 33.0-33.9, adult: Secondary | ICD-10-CM | POA: Diagnosis not present

## 2016-03-07 DIAGNOSIS — E782 Mixed hyperlipidemia: Secondary | ICD-10-CM | POA: Diagnosis not present

## 2016-03-07 DIAGNOSIS — E7800 Pure hypercholesterolemia, unspecified: Secondary | ICD-10-CM | POA: Diagnosis not present

## 2016-03-08 DIAGNOSIS — Z6833 Body mass index (BMI) 33.0-33.9, adult: Secondary | ICD-10-CM | POA: Diagnosis not present

## 2016-03-08 DIAGNOSIS — E063 Autoimmune thyroiditis: Secondary | ICD-10-CM | POA: Diagnosis not present

## 2016-03-08 DIAGNOSIS — L438 Other lichen planus: Secondary | ICD-10-CM | POA: Diagnosis not present

## 2016-03-08 DIAGNOSIS — G43019 Migraine without aura, intractable, without status migrainosus: Secondary | ICD-10-CM | POA: Diagnosis not present

## 2016-03-08 DIAGNOSIS — E119 Type 2 diabetes mellitus without complications: Secondary | ICD-10-CM | POA: Diagnosis not present

## 2016-03-08 DIAGNOSIS — R6889 Other general symptoms and signs: Secondary | ICD-10-CM | POA: Diagnosis not present

## 2016-03-08 DIAGNOSIS — G2581 Restless legs syndrome: Secondary | ICD-10-CM | POA: Diagnosis not present

## 2016-03-10 DIAGNOSIS — E039 Hypothyroidism, unspecified: Secondary | ICD-10-CM | POA: Diagnosis not present

## 2016-03-10 DIAGNOSIS — E119 Type 2 diabetes mellitus without complications: Secondary | ICD-10-CM | POA: Diagnosis not present

## 2016-03-10 DIAGNOSIS — E1159 Type 2 diabetes mellitus with other circulatory complications: Secondary | ICD-10-CM | POA: Diagnosis not present

## 2016-03-10 DIAGNOSIS — E785 Hyperlipidemia, unspecified: Secondary | ICD-10-CM | POA: Diagnosis not present

## 2016-03-22 DIAGNOSIS — E1169 Type 2 diabetes mellitus with other specified complication: Secondary | ICD-10-CM | POA: Diagnosis not present

## 2016-03-22 DIAGNOSIS — E039 Hypothyroidism, unspecified: Secondary | ICD-10-CM | POA: Diagnosis not present

## 2016-03-22 DIAGNOSIS — I110 Hypertensive heart disease with heart failure: Secondary | ICD-10-CM | POA: Diagnosis not present

## 2016-03-22 DIAGNOSIS — E1165 Type 2 diabetes mellitus with hyperglycemia: Secondary | ICD-10-CM | POA: Diagnosis not present

## 2016-03-31 DIAGNOSIS — E890 Postprocedural hypothyroidism: Secondary | ICD-10-CM | POA: Diagnosis not present

## 2016-03-31 DIAGNOSIS — Z79899 Other long term (current) drug therapy: Secondary | ICD-10-CM | POA: Diagnosis not present

## 2016-03-31 DIAGNOSIS — E063 Autoimmune thyroiditis: Secondary | ICD-10-CM | POA: Diagnosis not present

## 2016-03-31 DIAGNOSIS — E2740 Unspecified adrenocortical insufficiency: Secondary | ICD-10-CM | POA: Diagnosis not present

## 2016-04-07 DIAGNOSIS — M1712 Unilateral primary osteoarthritis, left knee: Secondary | ICD-10-CM | POA: Diagnosis not present

## 2016-04-08 DIAGNOSIS — I5022 Chronic systolic (congestive) heart failure: Secondary | ICD-10-CM | POA: Diagnosis not present

## 2016-04-08 DIAGNOSIS — R0602 Shortness of breath: Secondary | ICD-10-CM | POA: Diagnosis not present

## 2016-04-08 DIAGNOSIS — I110 Hypertensive heart disease with heart failure: Secondary | ICD-10-CM | POA: Diagnosis not present

## 2016-04-08 DIAGNOSIS — R079 Chest pain, unspecified: Secondary | ICD-10-CM | POA: Diagnosis not present

## 2016-04-08 DIAGNOSIS — I255 Ischemic cardiomyopathy: Secondary | ICD-10-CM | POA: Insufficient documentation

## 2016-04-08 DIAGNOSIS — R110 Nausea: Secondary | ICD-10-CM | POA: Diagnosis not present

## 2016-04-08 DIAGNOSIS — R0789 Other chest pain: Secondary | ICD-10-CM | POA: Diagnosis not present

## 2016-04-08 DIAGNOSIS — M79602 Pain in left arm: Secondary | ICD-10-CM | POA: Diagnosis not present

## 2016-04-08 DIAGNOSIS — E119 Type 2 diabetes mellitus without complications: Secondary | ICD-10-CM | POA: Insufficient documentation

## 2016-04-08 DIAGNOSIS — E876 Hypokalemia: Secondary | ICD-10-CM | POA: Diagnosis not present

## 2016-04-08 DIAGNOSIS — I4901 Ventricular fibrillation: Secondary | ICD-10-CM | POA: Diagnosis not present

## 2016-04-08 DIAGNOSIS — I5023 Acute on chronic systolic (congestive) heart failure: Secondary | ICD-10-CM | POA: Insufficient documentation

## 2016-04-08 DIAGNOSIS — I420 Dilated cardiomyopathy: Secondary | ICD-10-CM | POA: Diagnosis not present

## 2016-04-08 DIAGNOSIS — E2740 Unspecified adrenocortical insufficiency: Secondary | ICD-10-CM | POA: Diagnosis not present

## 2016-04-08 DIAGNOSIS — Z794 Long term (current) use of insulin: Secondary | ICD-10-CM

## 2016-04-08 DIAGNOSIS — Z6833 Body mass index (BMI) 33.0-33.9, adult: Secondary | ICD-10-CM | POA: Diagnosis not present

## 2016-04-08 HISTORY — DX: Acute on chronic systolic (congestive) heart failure: I50.23

## 2016-04-09 DIAGNOSIS — Z7982 Long term (current) use of aspirin: Secondary | ICD-10-CM | POA: Diagnosis not present

## 2016-04-09 DIAGNOSIS — E2740 Unspecified adrenocortical insufficiency: Secondary | ICD-10-CM | POA: Diagnosis not present

## 2016-04-09 DIAGNOSIS — Z9981 Dependence on supplemental oxygen: Secondary | ICD-10-CM | POA: Diagnosis not present

## 2016-04-09 DIAGNOSIS — F329 Major depressive disorder, single episode, unspecified: Secondary | ICD-10-CM | POA: Diagnosis not present

## 2016-04-09 DIAGNOSIS — I4901 Ventricular fibrillation: Secondary | ICD-10-CM | POA: Diagnosis not present

## 2016-04-09 DIAGNOSIS — I472 Ventricular tachycardia, unspecified: Secondary | ICD-10-CM | POA: Insufficient documentation

## 2016-04-09 DIAGNOSIS — F419 Anxiety disorder, unspecified: Secondary | ICD-10-CM | POA: Diagnosis not present

## 2016-04-09 DIAGNOSIS — I252 Old myocardial infarction: Secondary | ICD-10-CM | POA: Diagnosis not present

## 2016-04-09 DIAGNOSIS — I110 Hypertensive heart disease with heart failure: Secondary | ICD-10-CM | POA: Diagnosis not present

## 2016-04-09 DIAGNOSIS — R0789 Other chest pain: Secondary | ICD-10-CM | POA: Diagnosis not present

## 2016-04-09 DIAGNOSIS — E119 Type 2 diabetes mellitus without complications: Secondary | ICD-10-CM | POA: Diagnosis not present

## 2016-04-09 DIAGNOSIS — Z8701 Personal history of pneumonia (recurrent): Secondary | ICD-10-CM | POA: Diagnosis not present

## 2016-04-09 DIAGNOSIS — Z6835 Body mass index (BMI) 35.0-35.9, adult: Secondary | ICD-10-CM | POA: Diagnosis not present

## 2016-04-09 DIAGNOSIS — I2510 Atherosclerotic heart disease of native coronary artery without angina pectoris: Secondary | ICD-10-CM | POA: Diagnosis not present

## 2016-04-09 DIAGNOSIS — Z6833 Body mass index (BMI) 33.0-33.9, adult: Secondary | ICD-10-CM | POA: Diagnosis not present

## 2016-04-09 DIAGNOSIS — R072 Precordial pain: Secondary | ICD-10-CM | POA: Diagnosis not present

## 2016-04-09 DIAGNOSIS — I5022 Chronic systolic (congestive) heart failure: Secondary | ICD-10-CM | POA: Diagnosis not present

## 2016-04-09 DIAGNOSIS — F17210 Nicotine dependence, cigarettes, uncomplicated: Secondary | ICD-10-CM | POA: Diagnosis not present

## 2016-04-09 DIAGNOSIS — M79602 Pain in left arm: Secondary | ICD-10-CM | POA: Diagnosis not present

## 2016-04-09 DIAGNOSIS — J449 Chronic obstructive pulmonary disease, unspecified: Secondary | ICD-10-CM | POA: Diagnosis not present

## 2016-04-09 DIAGNOSIS — I4729 Other ventricular tachycardia: Secondary | ICD-10-CM | POA: Insufficient documentation

## 2016-04-09 DIAGNOSIS — R079 Chest pain, unspecified: Secondary | ICD-10-CM | POA: Diagnosis not present

## 2016-04-09 DIAGNOSIS — M3500 Sicca syndrome, unspecified: Secondary | ICD-10-CM | POA: Diagnosis not present

## 2016-04-09 DIAGNOSIS — R110 Nausea: Secondary | ICD-10-CM | POA: Diagnosis not present

## 2016-04-09 DIAGNOSIS — R0602 Shortness of breath: Secondary | ICD-10-CM | POA: Diagnosis not present

## 2016-04-09 DIAGNOSIS — R9431 Abnormal electrocardiogram [ECG] [EKG]: Secondary | ICD-10-CM | POA: Diagnosis not present

## 2016-04-09 DIAGNOSIS — I517 Cardiomegaly: Secondary | ICD-10-CM | POA: Diagnosis not present

## 2016-04-09 DIAGNOSIS — I25118 Atherosclerotic heart disease of native coronary artery with other forms of angina pectoris: Secondary | ICD-10-CM | POA: Diagnosis not present

## 2016-04-09 DIAGNOSIS — Z955 Presence of coronary angioplasty implant and graft: Secondary | ICD-10-CM | POA: Diagnosis not present

## 2016-04-09 DIAGNOSIS — I340 Nonrheumatic mitral (valve) insufficiency: Secondary | ICD-10-CM | POA: Diagnosis not present

## 2016-04-09 DIAGNOSIS — F17200 Nicotine dependence, unspecified, uncomplicated: Secondary | ICD-10-CM | POA: Diagnosis not present

## 2016-04-09 DIAGNOSIS — E785 Hyperlipidemia, unspecified: Secondary | ICD-10-CM | POA: Diagnosis not present

## 2016-04-10 DIAGNOSIS — I2510 Atherosclerotic heart disease of native coronary artery without angina pectoris: Secondary | ICD-10-CM | POA: Diagnosis not present

## 2016-04-10 DIAGNOSIS — I5022 Chronic systolic (congestive) heart failure: Secondary | ICD-10-CM | POA: Diagnosis not present

## 2016-04-10 DIAGNOSIS — I110 Hypertensive heart disease with heart failure: Secondary | ICD-10-CM | POA: Diagnosis not present

## 2016-04-10 DIAGNOSIS — I252 Old myocardial infarction: Secondary | ICD-10-CM | POA: Diagnosis not present

## 2016-04-10 DIAGNOSIS — F17210 Nicotine dependence, cigarettes, uncomplicated: Secondary | ICD-10-CM | POA: Diagnosis not present

## 2016-04-10 DIAGNOSIS — R0789 Other chest pain: Secondary | ICD-10-CM | POA: Diagnosis not present

## 2016-04-10 DIAGNOSIS — E2740 Unspecified adrenocortical insufficiency: Secondary | ICD-10-CM | POA: Diagnosis not present

## 2016-04-10 DIAGNOSIS — R072 Precordial pain: Secondary | ICD-10-CM | POA: Diagnosis not present

## 2016-04-10 DIAGNOSIS — Z6833 Body mass index (BMI) 33.0-33.9, adult: Secondary | ICD-10-CM | POA: Diagnosis not present

## 2016-04-10 DIAGNOSIS — I4901 Ventricular fibrillation: Secondary | ICD-10-CM | POA: Diagnosis not present

## 2016-04-10 DIAGNOSIS — R9431 Abnormal electrocardiogram [ECG] [EKG]: Secondary | ICD-10-CM | POA: Diagnosis not present

## 2016-04-11 DIAGNOSIS — I25118 Atherosclerotic heart disease of native coronary artery with other forms of angina pectoris: Secondary | ICD-10-CM | POA: Diagnosis not present

## 2016-04-11 DIAGNOSIS — F17210 Nicotine dependence, cigarettes, uncomplicated: Secondary | ICD-10-CM | POA: Diagnosis not present

## 2016-04-11 DIAGNOSIS — I110 Hypertensive heart disease with heart failure: Secondary | ICD-10-CM | POA: Diagnosis not present

## 2016-04-11 DIAGNOSIS — Z6835 Body mass index (BMI) 35.0-35.9, adult: Secondary | ICD-10-CM | POA: Diagnosis not present

## 2016-04-11 DIAGNOSIS — E2740 Unspecified adrenocortical insufficiency: Secondary | ICD-10-CM | POA: Diagnosis not present

## 2016-04-11 DIAGNOSIS — R072 Precordial pain: Secondary | ICD-10-CM | POA: Diagnosis not present

## 2016-04-11 DIAGNOSIS — I4901 Ventricular fibrillation: Secondary | ICD-10-CM | POA: Diagnosis not present

## 2016-04-12 DIAGNOSIS — I4901 Ventricular fibrillation: Secondary | ICD-10-CM | POA: Diagnosis not present

## 2016-04-12 DIAGNOSIS — R072 Precordial pain: Secondary | ICD-10-CM | POA: Diagnosis not present

## 2016-04-12 DIAGNOSIS — E2740 Unspecified adrenocortical insufficiency: Secondary | ICD-10-CM | POA: Diagnosis not present

## 2016-04-12 DIAGNOSIS — Z6835 Body mass index (BMI) 35.0-35.9, adult: Secondary | ICD-10-CM | POA: Diagnosis not present

## 2016-04-12 DIAGNOSIS — F17200 Nicotine dependence, unspecified, uncomplicated: Secondary | ICD-10-CM | POA: Diagnosis not present

## 2016-04-12 DIAGNOSIS — I110 Hypertensive heart disease with heart failure: Secondary | ICD-10-CM | POA: Diagnosis not present

## 2016-04-16 DIAGNOSIS — F419 Anxiety disorder, unspecified: Secondary | ICD-10-CM | POA: Diagnosis not present

## 2016-04-16 DIAGNOSIS — I2510 Atherosclerotic heart disease of native coronary artery without angina pectoris: Secondary | ICD-10-CM | POA: Diagnosis not present

## 2016-04-16 DIAGNOSIS — S301XXA Contusion of abdominal wall, initial encounter: Secondary | ICD-10-CM | POA: Diagnosis not present

## 2016-04-16 DIAGNOSIS — E785 Hyperlipidemia, unspecified: Secondary | ICD-10-CM | POA: Diagnosis not present

## 2016-04-16 DIAGNOSIS — I898 Other specified noninfective disorders of lymphatic vessels and lymph nodes: Secondary | ICD-10-CM | POA: Insufficient documentation

## 2016-04-16 DIAGNOSIS — E2740 Unspecified adrenocortical insufficiency: Secondary | ICD-10-CM | POA: Diagnosis not present

## 2016-04-16 DIAGNOSIS — I25119 Atherosclerotic heart disease of native coronary artery with unspecified angina pectoris: Secondary | ICD-10-CM | POA: Diagnosis not present

## 2016-04-16 DIAGNOSIS — M7989 Other specified soft tissue disorders: Secondary | ICD-10-CM | POA: Diagnosis not present

## 2016-04-16 DIAGNOSIS — R222 Localized swelling, mass and lump, trunk: Secondary | ICD-10-CM | POA: Diagnosis not present

## 2016-04-16 DIAGNOSIS — T801XXA Vascular complications following infusion, transfusion and therapeutic injection, initial encounter: Secondary | ICD-10-CM | POA: Diagnosis not present

## 2016-04-16 DIAGNOSIS — I110 Hypertensive heart disease with heart failure: Secondary | ICD-10-CM | POA: Diagnosis not present

## 2016-04-16 DIAGNOSIS — J449 Chronic obstructive pulmonary disease, unspecified: Secondary | ICD-10-CM | POA: Diagnosis not present

## 2016-04-16 DIAGNOSIS — K759 Inflammatory liver disease, unspecified: Secondary | ICD-10-CM | POA: Diagnosis not present

## 2016-04-16 DIAGNOSIS — M791 Myalgia: Secondary | ICD-10-CM | POA: Diagnosis not present

## 2016-04-16 DIAGNOSIS — E875 Hyperkalemia: Secondary | ICD-10-CM | POA: Diagnosis not present

## 2016-04-16 DIAGNOSIS — M79631 Pain in right forearm: Secondary | ICD-10-CM | POA: Diagnosis not present

## 2016-04-16 DIAGNOSIS — J45909 Unspecified asthma, uncomplicated: Secondary | ICD-10-CM | POA: Diagnosis not present

## 2016-04-16 DIAGNOSIS — R1030 Lower abdominal pain, unspecified: Secondary | ICD-10-CM | POA: Diagnosis not present

## 2016-04-16 DIAGNOSIS — N390 Urinary tract infection, site not specified: Secondary | ICD-10-CM | POA: Diagnosis not present

## 2016-04-16 DIAGNOSIS — I5022 Chronic systolic (congestive) heart failure: Secondary | ICD-10-CM | POA: Diagnosis not present

## 2016-04-16 DIAGNOSIS — Z9861 Coronary angioplasty status: Secondary | ICD-10-CM | POA: Diagnosis not present

## 2016-04-16 DIAGNOSIS — G3184 Mild cognitive impairment, so stated: Secondary | ICD-10-CM | POA: Diagnosis not present

## 2016-04-16 DIAGNOSIS — E7800 Pure hypercholesterolemia, unspecified: Secondary | ICD-10-CM | POA: Diagnosis not present

## 2016-04-16 DIAGNOSIS — K922 Gastrointestinal hemorrhage, unspecified: Secondary | ICD-10-CM | POA: Diagnosis not present

## 2016-04-16 DIAGNOSIS — Z7901 Long term (current) use of anticoagulants: Secondary | ICD-10-CM | POA: Diagnosis not present

## 2016-04-16 DIAGNOSIS — E1142 Type 2 diabetes mellitus with diabetic polyneuropathy: Secondary | ICD-10-CM | POA: Diagnosis not present

## 2016-04-17 DIAGNOSIS — I110 Hypertensive heart disease with heart failure: Secondary | ICD-10-CM | POA: Diagnosis not present

## 2016-04-17 DIAGNOSIS — T801XXS Vascular complications following infusion, transfusion and therapeutic injection, sequela: Secondary | ICD-10-CM | POA: Diagnosis not present

## 2016-04-17 DIAGNOSIS — S301XXA Contusion of abdominal wall, initial encounter: Secondary | ICD-10-CM | POA: Diagnosis not present

## 2016-04-17 DIAGNOSIS — R1031 Right lower quadrant pain: Secondary | ICD-10-CM | POA: Diagnosis not present

## 2016-04-17 DIAGNOSIS — Z6835 Body mass index (BMI) 35.0-35.9, adult: Secondary | ICD-10-CM | POA: Diagnosis not present

## 2016-04-17 DIAGNOSIS — T801XXA Vascular complications following infusion, transfusion and therapeutic injection, initial encounter: Secondary | ICD-10-CM | POA: Diagnosis not present

## 2016-04-17 DIAGNOSIS — I898 Other specified noninfective disorders of lymphatic vessels and lymph nodes: Secondary | ICD-10-CM | POA: Diagnosis not present

## 2016-04-17 DIAGNOSIS — I10 Essential (primary) hypertension: Secondary | ICD-10-CM | POA: Diagnosis not present

## 2016-04-26 DIAGNOSIS — M79631 Pain in right forearm: Secondary | ICD-10-CM | POA: Diagnosis not present

## 2016-04-26 DIAGNOSIS — M792 Neuralgia and neuritis, unspecified: Secondary | ICD-10-CM | POA: Diagnosis not present

## 2016-04-26 DIAGNOSIS — Z6834 Body mass index (BMI) 34.0-34.9, adult: Secondary | ICD-10-CM | POA: Diagnosis not present

## 2016-04-26 DIAGNOSIS — J449 Chronic obstructive pulmonary disease, unspecified: Secondary | ICD-10-CM | POA: Diagnosis not present

## 2016-04-28 DIAGNOSIS — I2510 Atherosclerotic heart disease of native coronary artery without angina pectoris: Secondary | ICD-10-CM | POA: Diagnosis not present

## 2016-04-28 DIAGNOSIS — I209 Angina pectoris, unspecified: Secondary | ICD-10-CM | POA: Diagnosis not present

## 2016-04-28 DIAGNOSIS — E782 Mixed hyperlipidemia: Secondary | ICD-10-CM | POA: Diagnosis not present

## 2016-04-28 DIAGNOSIS — E119 Type 2 diabetes mellitus without complications: Secondary | ICD-10-CM | POA: Diagnosis not present

## 2016-04-29 DIAGNOSIS — I509 Heart failure, unspecified: Secondary | ICD-10-CM | POA: Diagnosis not present

## 2016-04-30 DIAGNOSIS — M2242 Chondromalacia patellae, left knee: Secondary | ICD-10-CM | POA: Diagnosis not present

## 2016-04-30 DIAGNOSIS — M1711 Unilateral primary osteoarthritis, right knee: Secondary | ICD-10-CM | POA: Diagnosis not present

## 2016-05-02 DIAGNOSIS — R0602 Shortness of breath: Secondary | ICD-10-CM | POA: Diagnosis not present

## 2016-05-02 DIAGNOSIS — E118 Type 2 diabetes mellitus with unspecified complications: Secondary | ICD-10-CM | POA: Diagnosis not present

## 2016-05-02 DIAGNOSIS — R188 Other ascites: Secondary | ICD-10-CM | POA: Diagnosis not present

## 2016-05-02 DIAGNOSIS — R1900 Intra-abdominal and pelvic swelling, mass and lump, unspecified site: Secondary | ICD-10-CM | POA: Diagnosis not present

## 2016-05-02 DIAGNOSIS — E669 Obesity, unspecified: Secondary | ICD-10-CM | POA: Diagnosis not present

## 2016-05-03 DIAGNOSIS — I110 Hypertensive heart disease with heart failure: Secondary | ICD-10-CM | POA: Diagnosis not present

## 2016-05-03 DIAGNOSIS — Z6835 Body mass index (BMI) 35.0-35.9, adult: Secondary | ICD-10-CM | POA: Diagnosis not present

## 2016-05-03 DIAGNOSIS — E039 Hypothyroidism, unspecified: Secondary | ICD-10-CM | POA: Diagnosis not present

## 2016-05-03 DIAGNOSIS — I2510 Atherosclerotic heart disease of native coronary artery without angina pectoris: Secondary | ICD-10-CM | POA: Diagnosis not present

## 2016-05-04 DIAGNOSIS — M1712 Unilateral primary osteoarthritis, left knee: Secondary | ICD-10-CM | POA: Diagnosis not present

## 2016-05-04 DIAGNOSIS — M25569 Pain in unspecified knee: Secondary | ICD-10-CM | POA: Diagnosis not present

## 2016-05-09 DIAGNOSIS — E119 Type 2 diabetes mellitus without complications: Secondary | ICD-10-CM | POA: Diagnosis not present

## 2016-05-09 DIAGNOSIS — Z6835 Body mass index (BMI) 35.0-35.9, adult: Secondary | ICD-10-CM | POA: Diagnosis not present

## 2016-05-09 DIAGNOSIS — Z8719 Personal history of other diseases of the digestive system: Secondary | ICD-10-CM | POA: Diagnosis not present

## 2016-05-09 DIAGNOSIS — T8189XD Other complications of procedures, not elsewhere classified, subsequent encounter: Secondary | ICD-10-CM | POA: Diagnosis not present

## 2016-05-10 DIAGNOSIS — J449 Chronic obstructive pulmonary disease, unspecified: Secondary | ICD-10-CM | POA: Diagnosis not present

## 2016-05-10 DIAGNOSIS — E039 Hypothyroidism, unspecified: Secondary | ICD-10-CM | POA: Diagnosis not present

## 2016-05-10 DIAGNOSIS — F17210 Nicotine dependence, cigarettes, uncomplicated: Secondary | ICD-10-CM | POA: Diagnosis not present

## 2016-05-10 DIAGNOSIS — I2510 Atherosclerotic heart disease of native coronary artery without angina pectoris: Secondary | ICD-10-CM | POA: Diagnosis not present

## 2016-05-10 DIAGNOSIS — I5022 Chronic systolic (congestive) heart failure: Secondary | ICD-10-CM | POA: Diagnosis not present

## 2016-05-10 DIAGNOSIS — E119 Type 2 diabetes mellitus without complications: Secondary | ICD-10-CM | POA: Diagnosis not present

## 2016-05-11 DIAGNOSIS — F17210 Nicotine dependence, cigarettes, uncomplicated: Secondary | ICD-10-CM | POA: Diagnosis not present

## 2016-05-11 DIAGNOSIS — J449 Chronic obstructive pulmonary disease, unspecified: Secondary | ICD-10-CM | POA: Diagnosis not present

## 2016-05-11 DIAGNOSIS — E039 Hypothyroidism, unspecified: Secondary | ICD-10-CM | POA: Diagnosis not present

## 2016-05-11 DIAGNOSIS — E119 Type 2 diabetes mellitus without complications: Secondary | ICD-10-CM | POA: Diagnosis not present

## 2016-05-11 DIAGNOSIS — I5022 Chronic systolic (congestive) heart failure: Secondary | ICD-10-CM | POA: Diagnosis not present

## 2016-05-11 DIAGNOSIS — I2510 Atherosclerotic heart disease of native coronary artery without angina pectoris: Secondary | ICD-10-CM | POA: Diagnosis not present

## 2016-05-13 DIAGNOSIS — I2510 Atherosclerotic heart disease of native coronary artery without angina pectoris: Secondary | ICD-10-CM | POA: Diagnosis not present

## 2016-05-13 DIAGNOSIS — J449 Chronic obstructive pulmonary disease, unspecified: Secondary | ICD-10-CM | POA: Diagnosis not present

## 2016-05-13 DIAGNOSIS — F17210 Nicotine dependence, cigarettes, uncomplicated: Secondary | ICD-10-CM | POA: Diagnosis not present

## 2016-05-13 DIAGNOSIS — E039 Hypothyroidism, unspecified: Secondary | ICD-10-CM | POA: Diagnosis not present

## 2016-05-13 DIAGNOSIS — E119 Type 2 diabetes mellitus without complications: Secondary | ICD-10-CM | POA: Diagnosis not present

## 2016-05-13 DIAGNOSIS — I5022 Chronic systolic (congestive) heart failure: Secondary | ICD-10-CM | POA: Diagnosis not present

## 2016-05-16 DIAGNOSIS — F17210 Nicotine dependence, cigarettes, uncomplicated: Secondary | ICD-10-CM | POA: Diagnosis not present

## 2016-05-16 DIAGNOSIS — J449 Chronic obstructive pulmonary disease, unspecified: Secondary | ICD-10-CM | POA: Diagnosis not present

## 2016-05-16 DIAGNOSIS — I5022 Chronic systolic (congestive) heart failure: Secondary | ICD-10-CM | POA: Diagnosis not present

## 2016-05-16 DIAGNOSIS — E119 Type 2 diabetes mellitus without complications: Secondary | ICD-10-CM | POA: Diagnosis not present

## 2016-05-16 DIAGNOSIS — I2510 Atherosclerotic heart disease of native coronary artery without angina pectoris: Secondary | ICD-10-CM | POA: Diagnosis not present

## 2016-05-16 DIAGNOSIS — E039 Hypothyroidism, unspecified: Secondary | ICD-10-CM | POA: Diagnosis not present

## 2016-05-17 DIAGNOSIS — G4733 Obstructive sleep apnea (adult) (pediatric): Secondary | ICD-10-CM | POA: Diagnosis not present

## 2016-05-17 DIAGNOSIS — R911 Solitary pulmonary nodule: Secondary | ICD-10-CM | POA: Diagnosis not present

## 2016-05-17 DIAGNOSIS — J449 Chronic obstructive pulmonary disease, unspecified: Secondary | ICD-10-CM | POA: Diagnosis not present

## 2016-05-17 DIAGNOSIS — F17210 Nicotine dependence, cigarettes, uncomplicated: Secondary | ICD-10-CM | POA: Diagnosis not present

## 2016-05-20 DIAGNOSIS — E039 Hypothyroidism, unspecified: Secondary | ICD-10-CM | POA: Diagnosis not present

## 2016-05-20 DIAGNOSIS — I5022 Chronic systolic (congestive) heart failure: Secondary | ICD-10-CM | POA: Diagnosis not present

## 2016-05-20 DIAGNOSIS — J449 Chronic obstructive pulmonary disease, unspecified: Secondary | ICD-10-CM | POA: Diagnosis not present

## 2016-05-20 DIAGNOSIS — I2510 Atherosclerotic heart disease of native coronary artery without angina pectoris: Secondary | ICD-10-CM | POA: Diagnosis not present

## 2016-05-20 DIAGNOSIS — E119 Type 2 diabetes mellitus without complications: Secondary | ICD-10-CM | POA: Diagnosis not present

## 2016-05-20 DIAGNOSIS — F17210 Nicotine dependence, cigarettes, uncomplicated: Secondary | ICD-10-CM | POA: Diagnosis not present

## 2016-05-23 DIAGNOSIS — S301XXD Contusion of abdominal wall, subsequent encounter: Secondary | ICD-10-CM | POA: Diagnosis not present

## 2016-05-25 DIAGNOSIS — J449 Chronic obstructive pulmonary disease, unspecified: Secondary | ICD-10-CM | POA: Diagnosis not present

## 2016-05-26 DIAGNOSIS — M1712 Unilateral primary osteoarthritis, left knee: Secondary | ICD-10-CM | POA: Diagnosis not present

## 2016-06-01 DIAGNOSIS — F321 Major depressive disorder, single episode, moderate: Secondary | ICD-10-CM | POA: Diagnosis not present

## 2016-06-01 DIAGNOSIS — G4733 Obstructive sleep apnea (adult) (pediatric): Secondary | ICD-10-CM | POA: Diagnosis not present

## 2016-06-01 DIAGNOSIS — Z6835 Body mass index (BMI) 35.0-35.9, adult: Secondary | ICD-10-CM | POA: Diagnosis not present

## 2016-06-01 DIAGNOSIS — I2510 Atherosclerotic heart disease of native coronary artery without angina pectoris: Secondary | ICD-10-CM | POA: Diagnosis not present

## 2016-06-01 DIAGNOSIS — R600 Localized edema: Secondary | ICD-10-CM | POA: Diagnosis not present

## 2016-06-02 DIAGNOSIS — E1165 Type 2 diabetes mellitus with hyperglycemia: Secondary | ICD-10-CM | POA: Diagnosis not present

## 2016-06-02 DIAGNOSIS — I110 Hypertensive heart disease with heart failure: Secondary | ICD-10-CM | POA: Diagnosis not present

## 2016-06-02 DIAGNOSIS — E785 Hyperlipidemia, unspecified: Secondary | ICD-10-CM | POA: Diagnosis not present

## 2016-06-02 DIAGNOSIS — M1712 Unilateral primary osteoarthritis, left knee: Secondary | ICD-10-CM | POA: Diagnosis not present

## 2016-06-03 DIAGNOSIS — I5022 Chronic systolic (congestive) heart failure: Secondary | ICD-10-CM | POA: Diagnosis not present

## 2016-06-03 DIAGNOSIS — I2510 Atherosclerotic heart disease of native coronary artery without angina pectoris: Secondary | ICD-10-CM | POA: Diagnosis not present

## 2016-06-03 DIAGNOSIS — E119 Type 2 diabetes mellitus without complications: Secondary | ICD-10-CM | POA: Diagnosis not present

## 2016-06-03 DIAGNOSIS — F17210 Nicotine dependence, cigarettes, uncomplicated: Secondary | ICD-10-CM | POA: Diagnosis not present

## 2016-06-03 DIAGNOSIS — J449 Chronic obstructive pulmonary disease, unspecified: Secondary | ICD-10-CM | POA: Diagnosis not present

## 2016-06-03 DIAGNOSIS — E039 Hypothyroidism, unspecified: Secondary | ICD-10-CM | POA: Diagnosis not present

## 2016-06-06 DIAGNOSIS — T801XXD Vascular complications following infusion, transfusion and therapeutic injection, subsequent encounter: Secondary | ICD-10-CM | POA: Diagnosis not present

## 2016-06-06 DIAGNOSIS — M79631 Pain in right forearm: Secondary | ICD-10-CM | POA: Diagnosis not present

## 2016-06-06 DIAGNOSIS — Z6835 Body mass index (BMI) 35.0-35.9, adult: Secondary | ICD-10-CM | POA: Diagnosis not present

## 2016-06-07 DIAGNOSIS — I209 Angina pectoris, unspecified: Secondary | ICD-10-CM | POA: Diagnosis not present

## 2016-06-07 DIAGNOSIS — E782 Mixed hyperlipidemia: Secondary | ICD-10-CM | POA: Diagnosis not present

## 2016-06-07 DIAGNOSIS — I2510 Atherosclerotic heart disease of native coronary artery without angina pectoris: Secondary | ICD-10-CM | POA: Diagnosis not present

## 2016-06-07 DIAGNOSIS — I255 Ischemic cardiomyopathy: Secondary | ICD-10-CM | POA: Diagnosis not present

## 2016-06-09 DIAGNOSIS — E785 Hyperlipidemia, unspecified: Secondary | ICD-10-CM | POA: Diagnosis not present

## 2016-06-09 DIAGNOSIS — E1159 Type 2 diabetes mellitus with other circulatory complications: Secondary | ICD-10-CM | POA: Diagnosis not present

## 2016-06-09 DIAGNOSIS — E1165 Type 2 diabetes mellitus with hyperglycemia: Secondary | ICD-10-CM | POA: Diagnosis not present

## 2016-06-09 DIAGNOSIS — I10 Essential (primary) hypertension: Secondary | ICD-10-CM | POA: Diagnosis not present

## 2016-06-10 DIAGNOSIS — I5022 Chronic systolic (congestive) heart failure: Secondary | ICD-10-CM | POA: Diagnosis not present

## 2016-06-10 DIAGNOSIS — J449 Chronic obstructive pulmonary disease, unspecified: Secondary | ICD-10-CM | POA: Diagnosis not present

## 2016-06-10 DIAGNOSIS — E039 Hypothyroidism, unspecified: Secondary | ICD-10-CM | POA: Diagnosis not present

## 2016-06-10 DIAGNOSIS — F17210 Nicotine dependence, cigarettes, uncomplicated: Secondary | ICD-10-CM | POA: Diagnosis not present

## 2016-06-10 DIAGNOSIS — I2510 Atherosclerotic heart disease of native coronary artery without angina pectoris: Secondary | ICD-10-CM | POA: Diagnosis not present

## 2016-06-10 DIAGNOSIS — E119 Type 2 diabetes mellitus without complications: Secondary | ICD-10-CM | POA: Diagnosis not present

## 2016-06-11 DIAGNOSIS — I10 Essential (primary) hypertension: Secondary | ICD-10-CM | POA: Diagnosis not present

## 2016-06-11 DIAGNOSIS — R413 Other amnesia: Secondary | ICD-10-CM | POA: Diagnosis not present

## 2016-06-11 DIAGNOSIS — Z9981 Dependence on supplemental oxygen: Secondary | ICD-10-CM | POA: Diagnosis not present

## 2016-06-11 DIAGNOSIS — E271 Primary adrenocortical insufficiency: Secondary | ICD-10-CM | POA: Diagnosis not present

## 2016-06-11 DIAGNOSIS — E785 Hyperlipidemia, unspecified: Secondary | ICD-10-CM | POA: Diagnosis not present

## 2016-06-11 DIAGNOSIS — Z780 Asymptomatic menopausal state: Secondary | ICD-10-CM | POA: Diagnosis not present

## 2016-06-11 DIAGNOSIS — I252 Old myocardial infarction: Secondary | ICD-10-CM | POA: Diagnosis not present

## 2016-06-11 DIAGNOSIS — R1011 Right upper quadrant pain: Secondary | ICD-10-CM | POA: Diagnosis not present

## 2016-06-11 DIAGNOSIS — I509 Heart failure, unspecified: Secondary | ICD-10-CM | POA: Diagnosis not present

## 2016-06-11 DIAGNOSIS — J449 Chronic obstructive pulmonary disease, unspecified: Secondary | ICD-10-CM | POA: Diagnosis not present

## 2016-06-11 DIAGNOSIS — F17210 Nicotine dependence, cigarettes, uncomplicated: Secondary | ICD-10-CM | POA: Diagnosis not present

## 2016-06-11 DIAGNOSIS — R109 Unspecified abdominal pain: Secondary | ICD-10-CM | POA: Diagnosis not present

## 2016-06-11 DIAGNOSIS — E119 Type 2 diabetes mellitus without complications: Secondary | ICD-10-CM | POA: Diagnosis not present

## 2016-06-11 DIAGNOSIS — L02211 Cutaneous abscess of abdominal wall: Secondary | ICD-10-CM | POA: Diagnosis not present

## 2016-06-11 DIAGNOSIS — R229 Localized swelling, mass and lump, unspecified: Secondary | ICD-10-CM | POA: Diagnosis not present

## 2016-06-11 DIAGNOSIS — I2510 Atherosclerotic heart disease of native coronary artery without angina pectoris: Secondary | ICD-10-CM | POA: Diagnosis not present

## 2016-06-11 DIAGNOSIS — I429 Cardiomyopathy, unspecified: Secondary | ICD-10-CM | POA: Diagnosis not present

## 2016-06-13 DIAGNOSIS — S301XXD Contusion of abdominal wall, subsequent encounter: Secondary | ICD-10-CM | POA: Diagnosis not present

## 2016-06-13 DIAGNOSIS — E119 Type 2 diabetes mellitus without complications: Secondary | ICD-10-CM | POA: Diagnosis not present

## 2016-06-13 DIAGNOSIS — T8189XD Other complications of procedures, not elsewhere classified, subsequent encounter: Secondary | ICD-10-CM | POA: Diagnosis not present

## 2016-06-13 DIAGNOSIS — E669 Obesity, unspecified: Secondary | ICD-10-CM | POA: Diagnosis not present

## 2016-06-13 DIAGNOSIS — M1712 Unilateral primary osteoarthritis, left knee: Secondary | ICD-10-CM | POA: Diagnosis not present

## 2016-06-14 DIAGNOSIS — T792XXS Traumatic secondary and recurrent hemorrhage and seroma, sequela: Secondary | ICD-10-CM | POA: Diagnosis not present

## 2016-06-14 DIAGNOSIS — Z6835 Body mass index (BMI) 35.0-35.9, adult: Secondary | ICD-10-CM | POA: Diagnosis not present

## 2016-06-14 DIAGNOSIS — T888XXS Other specified complications of surgical and medical care, not elsewhere classified, sequela: Secondary | ICD-10-CM | POA: Diagnosis not present

## 2016-06-20 DIAGNOSIS — Z9981 Dependence on supplemental oxygen: Secondary | ICD-10-CM | POA: Insufficient documentation

## 2016-06-21 DIAGNOSIS — Z8719 Personal history of other diseases of the digestive system: Secondary | ICD-10-CM | POA: Diagnosis not present

## 2016-06-21 DIAGNOSIS — Z9889 Other specified postprocedural states: Secondary | ICD-10-CM | POA: Diagnosis not present

## 2016-06-21 DIAGNOSIS — Z955 Presence of coronary angioplasty implant and graft: Secondary | ICD-10-CM | POA: Diagnosis not present

## 2016-06-21 DIAGNOSIS — I509 Heart failure, unspecified: Secondary | ICD-10-CM | POA: Diagnosis not present

## 2016-06-21 DIAGNOSIS — F419 Anxiety disorder, unspecified: Secondary | ICD-10-CM | POA: Diagnosis not present

## 2016-06-21 DIAGNOSIS — E669 Obesity, unspecified: Secondary | ICD-10-CM | POA: Diagnosis not present

## 2016-06-21 DIAGNOSIS — T8189XD Other complications of procedures, not elsewhere classified, subsequent encounter: Secondary | ICD-10-CM | POA: Diagnosis not present

## 2016-06-21 DIAGNOSIS — I252 Old myocardial infarction: Secondary | ICD-10-CM | POA: Diagnosis not present

## 2016-06-21 DIAGNOSIS — I429 Cardiomyopathy, unspecified: Secondary | ICD-10-CM | POA: Diagnosis not present

## 2016-06-21 DIAGNOSIS — R109 Unspecified abdominal pain: Secondary | ICD-10-CM | POA: Diagnosis not present

## 2016-06-21 DIAGNOSIS — L7634 Postprocedural seroma of skin and subcutaneous tissue following other procedure: Secondary | ICD-10-CM | POA: Diagnosis not present

## 2016-06-21 DIAGNOSIS — E785 Hyperlipidemia, unspecified: Secondary | ICD-10-CM | POA: Diagnosis not present

## 2016-06-21 DIAGNOSIS — I110 Hypertensive heart disease with heart failure: Secondary | ICD-10-CM | POA: Diagnosis not present

## 2016-06-21 DIAGNOSIS — J449 Chronic obstructive pulmonary disease, unspecified: Secondary | ICD-10-CM | POA: Diagnosis not present

## 2016-06-21 DIAGNOSIS — Z8673 Personal history of transient ischemic attack (TIA), and cerebral infarction without residual deficits: Secondary | ICD-10-CM | POA: Diagnosis not present

## 2016-06-21 DIAGNOSIS — I96 Gangrene, not elsewhere classified: Secondary | ICD-10-CM | POA: Diagnosis not present

## 2016-06-21 DIAGNOSIS — E119 Type 2 diabetes mellitus without complications: Secondary | ICD-10-CM | POA: Diagnosis not present

## 2016-06-21 DIAGNOSIS — I2510 Atherosclerotic heart disease of native coronary artery without angina pectoris: Secondary | ICD-10-CM | POA: Diagnosis not present

## 2016-06-21 DIAGNOSIS — Z6834 Body mass index (BMI) 34.0-34.9, adult: Secondary | ICD-10-CM | POA: Diagnosis not present

## 2016-06-21 DIAGNOSIS — F329 Major depressive disorder, single episode, unspecified: Secondary | ICD-10-CM | POA: Diagnosis not present

## 2016-06-22 DIAGNOSIS — T8189XA Other complications of procedures, not elsewhere classified, initial encounter: Secondary | ICD-10-CM | POA: Diagnosis not present

## 2016-06-22 DIAGNOSIS — L7634 Postprocedural seroma of skin and subcutaneous tissue following other procedure: Secondary | ICD-10-CM | POA: Diagnosis not present

## 2016-06-22 DIAGNOSIS — I110 Hypertensive heart disease with heart failure: Secondary | ICD-10-CM | POA: Diagnosis not present

## 2016-06-22 DIAGNOSIS — Z8673 Personal history of transient ischemic attack (TIA), and cerebral infarction without residual deficits: Secondary | ICD-10-CM | POA: Diagnosis not present

## 2016-06-22 DIAGNOSIS — I252 Old myocardial infarction: Secondary | ICD-10-CM | POA: Diagnosis not present

## 2016-06-22 DIAGNOSIS — Z6834 Body mass index (BMI) 34.0-34.9, adult: Secondary | ICD-10-CM | POA: Diagnosis not present

## 2016-06-22 DIAGNOSIS — E785 Hyperlipidemia, unspecified: Secondary | ICD-10-CM | POA: Diagnosis not present

## 2016-06-22 DIAGNOSIS — I96 Gangrene, not elsewhere classified: Secondary | ICD-10-CM | POA: Diagnosis not present

## 2016-06-22 DIAGNOSIS — E669 Obesity, unspecified: Secondary | ICD-10-CM | POA: Diagnosis not present

## 2016-06-22 DIAGNOSIS — Z955 Presence of coronary angioplasty implant and graft: Secondary | ICD-10-CM | POA: Diagnosis not present

## 2016-06-22 DIAGNOSIS — E119 Type 2 diabetes mellitus without complications: Secondary | ICD-10-CM | POA: Diagnosis not present

## 2016-06-22 DIAGNOSIS — I429 Cardiomyopathy, unspecified: Secondary | ICD-10-CM | POA: Diagnosis not present

## 2016-06-22 DIAGNOSIS — F329 Major depressive disorder, single episode, unspecified: Secondary | ICD-10-CM | POA: Diagnosis not present

## 2016-06-22 DIAGNOSIS — J449 Chronic obstructive pulmonary disease, unspecified: Secondary | ICD-10-CM | POA: Diagnosis not present

## 2016-06-22 DIAGNOSIS — F419 Anxiety disorder, unspecified: Secondary | ICD-10-CM | POA: Diagnosis not present

## 2016-06-22 DIAGNOSIS — I2510 Atherosclerotic heart disease of native coronary artery without angina pectoris: Secondary | ICD-10-CM | POA: Diagnosis not present

## 2016-06-22 DIAGNOSIS — I509 Heart failure, unspecified: Secondary | ICD-10-CM | POA: Diagnosis not present

## 2016-06-23 DIAGNOSIS — J449 Chronic obstructive pulmonary disease, unspecified: Secondary | ICD-10-CM | POA: Diagnosis not present

## 2016-06-23 DIAGNOSIS — I110 Hypertensive heart disease with heart failure: Secondary | ICD-10-CM | POA: Diagnosis not present

## 2016-06-23 DIAGNOSIS — E785 Hyperlipidemia, unspecified: Secondary | ICD-10-CM | POA: Diagnosis not present

## 2016-06-23 DIAGNOSIS — E119 Type 2 diabetes mellitus without complications: Secondary | ICD-10-CM | POA: Diagnosis not present

## 2016-06-23 DIAGNOSIS — M3500 Sicca syndrome, unspecified: Secondary | ICD-10-CM | POA: Diagnosis not present

## 2016-06-23 DIAGNOSIS — I509 Heart failure, unspecified: Secondary | ICD-10-CM | POA: Diagnosis not present

## 2016-06-23 DIAGNOSIS — E063 Autoimmune thyroiditis: Secondary | ICD-10-CM | POA: Diagnosis not present

## 2016-06-23 DIAGNOSIS — T8131XA Disruption of external operation (surgical) wound, not elsewhere classified, initial encounter: Secondary | ICD-10-CM | POA: Diagnosis not present

## 2016-06-23 DIAGNOSIS — E271 Primary adrenocortical insufficiency: Secondary | ICD-10-CM | POA: Diagnosis not present

## 2016-06-23 DIAGNOSIS — M1991 Primary osteoarthritis, unspecified site: Secondary | ICD-10-CM | POA: Diagnosis not present

## 2016-06-23 DIAGNOSIS — T8189XA Other complications of procedures, not elsewhere classified, initial encounter: Secondary | ICD-10-CM | POA: Diagnosis not present

## 2016-06-23 DIAGNOSIS — I2510 Atherosclerotic heart disease of native coronary artery without angina pectoris: Secondary | ICD-10-CM | POA: Diagnosis not present

## 2016-06-23 DIAGNOSIS — E236 Other disorders of pituitary gland: Secondary | ICD-10-CM | POA: Diagnosis not present

## 2016-06-23 DIAGNOSIS — T888 Other specified complications of surgical and medical care, not elsewhere classified: Secondary | ICD-10-CM | POA: Diagnosis not present

## 2016-06-24 DIAGNOSIS — J449 Chronic obstructive pulmonary disease, unspecified: Secondary | ICD-10-CM | POA: Diagnosis not present

## 2016-06-25 DIAGNOSIS — I2510 Atherosclerotic heart disease of native coronary artery without angina pectoris: Secondary | ICD-10-CM | POA: Diagnosis not present

## 2016-06-25 DIAGNOSIS — M1991 Primary osteoarthritis, unspecified site: Secondary | ICD-10-CM | POA: Diagnosis not present

## 2016-06-25 DIAGNOSIS — T8131XA Disruption of external operation (surgical) wound, not elsewhere classified, initial encounter: Secondary | ICD-10-CM | POA: Diagnosis not present

## 2016-06-25 DIAGNOSIS — M3500 Sicca syndrome, unspecified: Secondary | ICD-10-CM | POA: Diagnosis not present

## 2016-06-25 DIAGNOSIS — E236 Other disorders of pituitary gland: Secondary | ICD-10-CM | POA: Diagnosis not present

## 2016-06-25 DIAGNOSIS — J449 Chronic obstructive pulmonary disease, unspecified: Secondary | ICD-10-CM | POA: Diagnosis not present

## 2016-06-25 DIAGNOSIS — E119 Type 2 diabetes mellitus without complications: Secondary | ICD-10-CM | POA: Diagnosis not present

## 2016-06-25 DIAGNOSIS — E785 Hyperlipidemia, unspecified: Secondary | ICD-10-CM | POA: Diagnosis not present

## 2016-06-25 DIAGNOSIS — I110 Hypertensive heart disease with heart failure: Secondary | ICD-10-CM | POA: Diagnosis not present

## 2016-06-25 DIAGNOSIS — I509 Heart failure, unspecified: Secondary | ICD-10-CM | POA: Diagnosis not present

## 2016-06-25 DIAGNOSIS — E271 Primary adrenocortical insufficiency: Secondary | ICD-10-CM | POA: Diagnosis not present

## 2016-06-25 DIAGNOSIS — T888 Other specified complications of surgical and medical care, not elsewhere classified: Secondary | ICD-10-CM | POA: Diagnosis not present

## 2016-06-25 DIAGNOSIS — E063 Autoimmune thyroiditis: Secondary | ICD-10-CM | POA: Diagnosis not present

## 2016-06-25 DIAGNOSIS — T8189XA Other complications of procedures, not elsewhere classified, initial encounter: Secondary | ICD-10-CM | POA: Diagnosis not present

## 2016-06-26 DIAGNOSIS — T8189XA Other complications of procedures, not elsewhere classified, initial encounter: Secondary | ICD-10-CM | POA: Diagnosis not present

## 2016-06-29 DIAGNOSIS — I2510 Atherosclerotic heart disease of native coronary artery without angina pectoris: Secondary | ICD-10-CM | POA: Diagnosis not present

## 2016-06-29 DIAGNOSIS — E271 Primary adrenocortical insufficiency: Secondary | ICD-10-CM | POA: Diagnosis not present

## 2016-06-29 DIAGNOSIS — T888 Other specified complications of surgical and medical care, not elsewhere classified: Secondary | ICD-10-CM | POA: Diagnosis not present

## 2016-06-29 DIAGNOSIS — M3500 Sicca syndrome, unspecified: Secondary | ICD-10-CM | POA: Diagnosis not present

## 2016-06-29 DIAGNOSIS — T8189XA Other complications of procedures, not elsewhere classified, initial encounter: Secondary | ICD-10-CM | POA: Diagnosis not present

## 2016-06-29 DIAGNOSIS — E119 Type 2 diabetes mellitus without complications: Secondary | ICD-10-CM | POA: Diagnosis not present

## 2016-06-29 DIAGNOSIS — E785 Hyperlipidemia, unspecified: Secondary | ICD-10-CM | POA: Diagnosis not present

## 2016-06-29 DIAGNOSIS — M1991 Primary osteoarthritis, unspecified site: Secondary | ICD-10-CM | POA: Diagnosis not present

## 2016-06-29 DIAGNOSIS — J449 Chronic obstructive pulmonary disease, unspecified: Secondary | ICD-10-CM | POA: Diagnosis not present

## 2016-06-29 DIAGNOSIS — E236 Other disorders of pituitary gland: Secondary | ICD-10-CM | POA: Diagnosis not present

## 2016-06-29 DIAGNOSIS — T8131XA Disruption of external operation (surgical) wound, not elsewhere classified, initial encounter: Secondary | ICD-10-CM | POA: Diagnosis not present

## 2016-06-29 DIAGNOSIS — I509 Heart failure, unspecified: Secondary | ICD-10-CM | POA: Diagnosis not present

## 2016-06-29 DIAGNOSIS — E063 Autoimmune thyroiditis: Secondary | ICD-10-CM | POA: Diagnosis not present

## 2016-06-29 DIAGNOSIS — I110 Hypertensive heart disease with heart failure: Secondary | ICD-10-CM | POA: Diagnosis not present

## 2016-07-01 DIAGNOSIS — E785 Hyperlipidemia, unspecified: Secondary | ICD-10-CM | POA: Diagnosis not present

## 2016-07-01 DIAGNOSIS — I110 Hypertensive heart disease with heart failure: Secondary | ICD-10-CM | POA: Diagnosis not present

## 2016-07-01 DIAGNOSIS — T8131XA Disruption of external operation (surgical) wound, not elsewhere classified, initial encounter: Secondary | ICD-10-CM | POA: Diagnosis not present

## 2016-07-01 DIAGNOSIS — I2510 Atherosclerotic heart disease of native coronary artery without angina pectoris: Secondary | ICD-10-CM | POA: Diagnosis not present

## 2016-07-01 DIAGNOSIS — E119 Type 2 diabetes mellitus without complications: Secondary | ICD-10-CM | POA: Diagnosis not present

## 2016-07-01 DIAGNOSIS — E063 Autoimmune thyroiditis: Secondary | ICD-10-CM | POA: Diagnosis not present

## 2016-07-01 DIAGNOSIS — T888 Other specified complications of surgical and medical care, not elsewhere classified: Secondary | ICD-10-CM | POA: Diagnosis not present

## 2016-07-01 DIAGNOSIS — T8189XA Other complications of procedures, not elsewhere classified, initial encounter: Secondary | ICD-10-CM | POA: Diagnosis not present

## 2016-07-01 DIAGNOSIS — I509 Heart failure, unspecified: Secondary | ICD-10-CM | POA: Diagnosis not present

## 2016-07-01 DIAGNOSIS — E236 Other disorders of pituitary gland: Secondary | ICD-10-CM | POA: Diagnosis not present

## 2016-07-01 DIAGNOSIS — M1991 Primary osteoarthritis, unspecified site: Secondary | ICD-10-CM | POA: Diagnosis not present

## 2016-07-01 DIAGNOSIS — M3500 Sicca syndrome, unspecified: Secondary | ICD-10-CM | POA: Diagnosis not present

## 2016-07-01 DIAGNOSIS — E271 Primary adrenocortical insufficiency: Secondary | ICD-10-CM | POA: Diagnosis not present

## 2016-07-01 DIAGNOSIS — J449 Chronic obstructive pulmonary disease, unspecified: Secondary | ICD-10-CM | POA: Diagnosis not present

## 2016-07-04 DIAGNOSIS — Z09 Encounter for follow-up examination after completed treatment for conditions other than malignant neoplasm: Secondary | ICD-10-CM | POA: Diagnosis not present

## 2016-07-04 DIAGNOSIS — Z9889 Other specified postprocedural states: Secondary | ICD-10-CM | POA: Diagnosis not present

## 2016-07-05 DIAGNOSIS — Z1231 Encounter for screening mammogram for malignant neoplasm of breast: Secondary | ICD-10-CM | POA: Diagnosis not present

## 2016-07-05 DIAGNOSIS — R8290 Unspecified abnormal findings in urine: Secondary | ICD-10-CM | POA: Diagnosis not present

## 2016-07-05 DIAGNOSIS — Z6835 Body mass index (BMI) 35.0-35.9, adult: Secondary | ICD-10-CM | POA: Diagnosis not present

## 2016-07-05 DIAGNOSIS — Z0000 Encounter for general adult medical examination without abnormal findings: Secondary | ICD-10-CM | POA: Diagnosis not present

## 2016-07-06 DIAGNOSIS — I2510 Atherosclerotic heart disease of native coronary artery without angina pectoris: Secondary | ICD-10-CM | POA: Diagnosis not present

## 2016-07-06 DIAGNOSIS — E236 Other disorders of pituitary gland: Secondary | ICD-10-CM | POA: Diagnosis not present

## 2016-07-06 DIAGNOSIS — E063 Autoimmune thyroiditis: Secondary | ICD-10-CM | POA: Diagnosis not present

## 2016-07-06 DIAGNOSIS — I509 Heart failure, unspecified: Secondary | ICD-10-CM | POA: Diagnosis not present

## 2016-07-06 DIAGNOSIS — T8131XA Disruption of external operation (surgical) wound, not elsewhere classified, initial encounter: Secondary | ICD-10-CM | POA: Diagnosis not present

## 2016-07-06 DIAGNOSIS — M1991 Primary osteoarthritis, unspecified site: Secondary | ICD-10-CM | POA: Diagnosis not present

## 2016-07-06 DIAGNOSIS — J449 Chronic obstructive pulmonary disease, unspecified: Secondary | ICD-10-CM | POA: Diagnosis not present

## 2016-07-06 DIAGNOSIS — E785 Hyperlipidemia, unspecified: Secondary | ICD-10-CM | POA: Diagnosis not present

## 2016-07-06 DIAGNOSIS — I110 Hypertensive heart disease with heart failure: Secondary | ICD-10-CM | POA: Diagnosis not present

## 2016-07-06 DIAGNOSIS — M3500 Sicca syndrome, unspecified: Secondary | ICD-10-CM | POA: Diagnosis not present

## 2016-07-06 DIAGNOSIS — T888 Other specified complications of surgical and medical care, not elsewhere classified: Secondary | ICD-10-CM | POA: Diagnosis not present

## 2016-07-06 DIAGNOSIS — E271 Primary adrenocortical insufficiency: Secondary | ICD-10-CM | POA: Diagnosis not present

## 2016-07-06 DIAGNOSIS — E119 Type 2 diabetes mellitus without complications: Secondary | ICD-10-CM | POA: Diagnosis not present

## 2016-07-06 DIAGNOSIS — T8189XA Other complications of procedures, not elsewhere classified, initial encounter: Secondary | ICD-10-CM | POA: Diagnosis not present

## 2016-07-08 DIAGNOSIS — E063 Autoimmune thyroiditis: Secondary | ICD-10-CM | POA: Diagnosis not present

## 2016-07-08 DIAGNOSIS — E271 Primary adrenocortical insufficiency: Secondary | ICD-10-CM | POA: Diagnosis not present

## 2016-07-08 DIAGNOSIS — M1991 Primary osteoarthritis, unspecified site: Secondary | ICD-10-CM | POA: Diagnosis not present

## 2016-07-08 DIAGNOSIS — E119 Type 2 diabetes mellitus without complications: Secondary | ICD-10-CM | POA: Diagnosis not present

## 2016-07-08 DIAGNOSIS — I110 Hypertensive heart disease with heart failure: Secondary | ICD-10-CM | POA: Diagnosis not present

## 2016-07-08 DIAGNOSIS — T8189XA Other complications of procedures, not elsewhere classified, initial encounter: Secondary | ICD-10-CM | POA: Diagnosis not present

## 2016-07-08 DIAGNOSIS — I509 Heart failure, unspecified: Secondary | ICD-10-CM | POA: Diagnosis not present

## 2016-07-08 DIAGNOSIS — T888 Other specified complications of surgical and medical care, not elsewhere classified: Secondary | ICD-10-CM | POA: Diagnosis not present

## 2016-07-08 DIAGNOSIS — E785 Hyperlipidemia, unspecified: Secondary | ICD-10-CM | POA: Diagnosis not present

## 2016-07-08 DIAGNOSIS — M3500 Sicca syndrome, unspecified: Secondary | ICD-10-CM | POA: Diagnosis not present

## 2016-07-08 DIAGNOSIS — I2510 Atherosclerotic heart disease of native coronary artery without angina pectoris: Secondary | ICD-10-CM | POA: Diagnosis not present

## 2016-07-08 DIAGNOSIS — J449 Chronic obstructive pulmonary disease, unspecified: Secondary | ICD-10-CM | POA: Diagnosis not present

## 2016-07-08 DIAGNOSIS — T8131XA Disruption of external operation (surgical) wound, not elsewhere classified, initial encounter: Secondary | ICD-10-CM | POA: Diagnosis not present

## 2016-07-08 DIAGNOSIS — E236 Other disorders of pituitary gland: Secondary | ICD-10-CM | POA: Diagnosis not present

## 2016-07-11 DIAGNOSIS — K430 Incisional hernia with obstruction, without gangrene: Secondary | ICD-10-CM | POA: Diagnosis not present

## 2016-07-12 DIAGNOSIS — M3500 Sicca syndrome, unspecified: Secondary | ICD-10-CM | POA: Diagnosis not present

## 2016-07-12 DIAGNOSIS — I509 Heart failure, unspecified: Secondary | ICD-10-CM | POA: Diagnosis not present

## 2016-07-12 DIAGNOSIS — T888 Other specified complications of surgical and medical care, not elsewhere classified: Secondary | ICD-10-CM | POA: Diagnosis not present

## 2016-07-12 DIAGNOSIS — M1991 Primary osteoarthritis, unspecified site: Secondary | ICD-10-CM | POA: Diagnosis not present

## 2016-07-12 DIAGNOSIS — T8189XA Other complications of procedures, not elsewhere classified, initial encounter: Secondary | ICD-10-CM | POA: Diagnosis not present

## 2016-07-12 DIAGNOSIS — E785 Hyperlipidemia, unspecified: Secondary | ICD-10-CM | POA: Diagnosis not present

## 2016-07-12 DIAGNOSIS — J449 Chronic obstructive pulmonary disease, unspecified: Secondary | ICD-10-CM | POA: Diagnosis not present

## 2016-07-12 DIAGNOSIS — T8131XA Disruption of external operation (surgical) wound, not elsewhere classified, initial encounter: Secondary | ICD-10-CM | POA: Diagnosis not present

## 2016-07-12 DIAGNOSIS — E271 Primary adrenocortical insufficiency: Secondary | ICD-10-CM | POA: Diagnosis not present

## 2016-07-12 DIAGNOSIS — E119 Type 2 diabetes mellitus without complications: Secondary | ICD-10-CM | POA: Diagnosis not present

## 2016-07-12 DIAGNOSIS — I2510 Atherosclerotic heart disease of native coronary artery without angina pectoris: Secondary | ICD-10-CM | POA: Diagnosis not present

## 2016-07-12 DIAGNOSIS — E236 Other disorders of pituitary gland: Secondary | ICD-10-CM | POA: Diagnosis not present

## 2016-07-12 DIAGNOSIS — E063 Autoimmune thyroiditis: Secondary | ICD-10-CM | POA: Diagnosis not present

## 2016-07-12 DIAGNOSIS — I110 Hypertensive heart disease with heart failure: Secondary | ICD-10-CM | POA: Diagnosis not present

## 2016-07-13 DIAGNOSIS — I2510 Atherosclerotic heart disease of native coronary artery without angina pectoris: Secondary | ICD-10-CM | POA: Diagnosis not present

## 2016-07-13 DIAGNOSIS — T8189XA Other complications of procedures, not elsewhere classified, initial encounter: Secondary | ICD-10-CM | POA: Diagnosis not present

## 2016-07-13 DIAGNOSIS — T8131XA Disruption of external operation (surgical) wound, not elsewhere classified, initial encounter: Secondary | ICD-10-CM | POA: Diagnosis not present

## 2016-07-13 DIAGNOSIS — E119 Type 2 diabetes mellitus without complications: Secondary | ICD-10-CM | POA: Diagnosis not present

## 2016-07-13 DIAGNOSIS — J449 Chronic obstructive pulmonary disease, unspecified: Secondary | ICD-10-CM | POA: Diagnosis not present

## 2016-07-13 DIAGNOSIS — M1991 Primary osteoarthritis, unspecified site: Secondary | ICD-10-CM | POA: Diagnosis not present

## 2016-07-13 DIAGNOSIS — E271 Primary adrenocortical insufficiency: Secondary | ICD-10-CM | POA: Diagnosis not present

## 2016-07-13 DIAGNOSIS — I509 Heart failure, unspecified: Secondary | ICD-10-CM | POA: Diagnosis not present

## 2016-07-13 DIAGNOSIS — E785 Hyperlipidemia, unspecified: Secondary | ICD-10-CM | POA: Diagnosis not present

## 2016-07-13 DIAGNOSIS — M3500 Sicca syndrome, unspecified: Secondary | ICD-10-CM | POA: Diagnosis not present

## 2016-07-13 DIAGNOSIS — E063 Autoimmune thyroiditis: Secondary | ICD-10-CM | POA: Diagnosis not present

## 2016-07-13 DIAGNOSIS — I110 Hypertensive heart disease with heart failure: Secondary | ICD-10-CM | POA: Diagnosis not present

## 2016-07-13 DIAGNOSIS — Z1231 Encounter for screening mammogram for malignant neoplasm of breast: Secondary | ICD-10-CM | POA: Diagnosis not present

## 2016-07-13 DIAGNOSIS — T888 Other specified complications of surgical and medical care, not elsewhere classified: Secondary | ICD-10-CM | POA: Diagnosis not present

## 2016-07-13 DIAGNOSIS — E236 Other disorders of pituitary gland: Secondary | ICD-10-CM | POA: Diagnosis not present

## 2016-07-15 DIAGNOSIS — E119 Type 2 diabetes mellitus without complications: Secondary | ICD-10-CM | POA: Diagnosis not present

## 2016-07-15 DIAGNOSIS — E271 Primary adrenocortical insufficiency: Secondary | ICD-10-CM | POA: Diagnosis not present

## 2016-07-15 DIAGNOSIS — T888 Other specified complications of surgical and medical care, not elsewhere classified: Secondary | ICD-10-CM | POA: Diagnosis not present

## 2016-07-15 DIAGNOSIS — E785 Hyperlipidemia, unspecified: Secondary | ICD-10-CM | POA: Diagnosis not present

## 2016-07-15 DIAGNOSIS — R55 Syncope and collapse: Secondary | ICD-10-CM | POA: Diagnosis not present

## 2016-07-15 DIAGNOSIS — J449 Chronic obstructive pulmonary disease, unspecified: Secondary | ICD-10-CM | POA: Diagnosis not present

## 2016-07-15 DIAGNOSIS — I509 Heart failure, unspecified: Secondary | ICD-10-CM | POA: Diagnosis not present

## 2016-07-15 DIAGNOSIS — M3500 Sicca syndrome, unspecified: Secondary | ICD-10-CM | POA: Diagnosis not present

## 2016-07-15 DIAGNOSIS — M1991 Primary osteoarthritis, unspecified site: Secondary | ICD-10-CM | POA: Diagnosis not present

## 2016-07-15 DIAGNOSIS — E236 Other disorders of pituitary gland: Secondary | ICD-10-CM | POA: Diagnosis not present

## 2016-07-15 DIAGNOSIS — T8131XA Disruption of external operation (surgical) wound, not elsewhere classified, initial encounter: Secondary | ICD-10-CM | POA: Diagnosis not present

## 2016-07-15 DIAGNOSIS — I110 Hypertensive heart disease with heart failure: Secondary | ICD-10-CM | POA: Diagnosis not present

## 2016-07-15 DIAGNOSIS — I2510 Atherosclerotic heart disease of native coronary artery without angina pectoris: Secondary | ICD-10-CM | POA: Diagnosis not present

## 2016-07-15 DIAGNOSIS — T8189XA Other complications of procedures, not elsewhere classified, initial encounter: Secondary | ICD-10-CM | POA: Diagnosis not present

## 2016-07-15 DIAGNOSIS — E063 Autoimmune thyroiditis: Secondary | ICD-10-CM | POA: Diagnosis not present

## 2016-07-18 DIAGNOSIS — Z8719 Personal history of other diseases of the digestive system: Secondary | ICD-10-CM | POA: Diagnosis not present

## 2016-07-18 DIAGNOSIS — K430 Incisional hernia with obstruction, without gangrene: Secondary | ICD-10-CM | POA: Diagnosis not present

## 2016-07-18 DIAGNOSIS — T8189XD Other complications of procedures, not elsewhere classified, subsequent encounter: Secondary | ICD-10-CM | POA: Diagnosis not present

## 2016-07-18 DIAGNOSIS — Z9889 Other specified postprocedural states: Secondary | ICD-10-CM | POA: Diagnosis not present

## 2016-07-19 DIAGNOSIS — R8290 Unspecified abnormal findings in urine: Secondary | ICD-10-CM | POA: Diagnosis not present

## 2016-07-19 DIAGNOSIS — Z6835 Body mass index (BMI) 35.0-35.9, adult: Secondary | ICD-10-CM | POA: Diagnosis not present

## 2016-07-19 DIAGNOSIS — Z139 Encounter for screening, unspecified: Secondary | ICD-10-CM | POA: Diagnosis not present

## 2016-07-20 DIAGNOSIS — I2510 Atherosclerotic heart disease of native coronary artery without angina pectoris: Secondary | ICD-10-CM | POA: Diagnosis not present

## 2016-07-20 DIAGNOSIS — M542 Cervicalgia: Secondary | ICD-10-CM | POA: Diagnosis not present

## 2016-07-20 DIAGNOSIS — T888 Other specified complications of surgical and medical care, not elsewhere classified: Secondary | ICD-10-CM | POA: Diagnosis not present

## 2016-07-20 DIAGNOSIS — T8131XA Disruption of external operation (surgical) wound, not elsewhere classified, initial encounter: Secondary | ICD-10-CM | POA: Diagnosis not present

## 2016-07-20 DIAGNOSIS — I110 Hypertensive heart disease with heart failure: Secondary | ICD-10-CM | POA: Diagnosis not present

## 2016-07-20 DIAGNOSIS — T8189XA Other complications of procedures, not elsewhere classified, initial encounter: Secondary | ICD-10-CM | POA: Diagnosis not present

## 2016-07-20 DIAGNOSIS — J449 Chronic obstructive pulmonary disease, unspecified: Secondary | ICD-10-CM | POA: Diagnosis not present

## 2016-07-20 DIAGNOSIS — E236 Other disorders of pituitary gland: Secondary | ICD-10-CM | POA: Diagnosis not present

## 2016-07-20 DIAGNOSIS — M3500 Sicca syndrome, unspecified: Secondary | ICD-10-CM | POA: Diagnosis not present

## 2016-07-20 DIAGNOSIS — I509 Heart failure, unspecified: Secondary | ICD-10-CM | POA: Diagnosis not present

## 2016-07-20 DIAGNOSIS — E271 Primary adrenocortical insufficiency: Secondary | ICD-10-CM | POA: Diagnosis not present

## 2016-07-20 DIAGNOSIS — E785 Hyperlipidemia, unspecified: Secondary | ICD-10-CM | POA: Diagnosis not present

## 2016-07-20 DIAGNOSIS — E119 Type 2 diabetes mellitus without complications: Secondary | ICD-10-CM | POA: Diagnosis not present

## 2016-07-20 DIAGNOSIS — M1991 Primary osteoarthritis, unspecified site: Secondary | ICD-10-CM | POA: Diagnosis not present

## 2016-07-20 DIAGNOSIS — E063 Autoimmune thyroiditis: Secondary | ICD-10-CM | POA: Diagnosis not present

## 2016-07-22 DIAGNOSIS — T8189XA Other complications of procedures, not elsewhere classified, initial encounter: Secondary | ICD-10-CM | POA: Diagnosis not present

## 2016-07-22 DIAGNOSIS — I509 Heart failure, unspecified: Secondary | ICD-10-CM | POA: Diagnosis not present

## 2016-07-22 DIAGNOSIS — E119 Type 2 diabetes mellitus without complications: Secondary | ICD-10-CM | POA: Diagnosis not present

## 2016-07-22 DIAGNOSIS — I110 Hypertensive heart disease with heart failure: Secondary | ICD-10-CM | POA: Diagnosis not present

## 2016-07-22 DIAGNOSIS — E785 Hyperlipidemia, unspecified: Secondary | ICD-10-CM | POA: Diagnosis not present

## 2016-07-22 DIAGNOSIS — E063 Autoimmune thyroiditis: Secondary | ICD-10-CM | POA: Diagnosis not present

## 2016-07-22 DIAGNOSIS — M3500 Sicca syndrome, unspecified: Secondary | ICD-10-CM | POA: Diagnosis not present

## 2016-07-22 DIAGNOSIS — E271 Primary adrenocortical insufficiency: Secondary | ICD-10-CM | POA: Diagnosis not present

## 2016-07-22 DIAGNOSIS — E236 Other disorders of pituitary gland: Secondary | ICD-10-CM | POA: Diagnosis not present

## 2016-07-22 DIAGNOSIS — J449 Chronic obstructive pulmonary disease, unspecified: Secondary | ICD-10-CM | POA: Diagnosis not present

## 2016-07-22 DIAGNOSIS — T8131XA Disruption of external operation (surgical) wound, not elsewhere classified, initial encounter: Secondary | ICD-10-CM | POA: Diagnosis not present

## 2016-07-22 DIAGNOSIS — I2510 Atherosclerotic heart disease of native coronary artery without angina pectoris: Secondary | ICD-10-CM | POA: Diagnosis not present

## 2016-07-22 DIAGNOSIS — T888 Other specified complications of surgical and medical care, not elsewhere classified: Secondary | ICD-10-CM | POA: Diagnosis not present

## 2016-07-22 DIAGNOSIS — M1991 Primary osteoarthritis, unspecified site: Secondary | ICD-10-CM | POA: Diagnosis not present

## 2016-07-23 DIAGNOSIS — I429 Cardiomyopathy, unspecified: Secondary | ICD-10-CM | POA: Diagnosis not present

## 2016-07-23 DIAGNOSIS — Z825 Family history of asthma and other chronic lower respiratory diseases: Secondary | ICD-10-CM | POA: Diagnosis not present

## 2016-07-23 DIAGNOSIS — E860 Dehydration: Secondary | ICD-10-CM | POA: Diagnosis not present

## 2016-07-23 DIAGNOSIS — Z955 Presence of coronary angioplasty implant and graft: Secondary | ICD-10-CM | POA: Diagnosis not present

## 2016-07-23 DIAGNOSIS — F17210 Nicotine dependence, cigarettes, uncomplicated: Secondary | ICD-10-CM | POA: Diagnosis not present

## 2016-07-23 DIAGNOSIS — E11649 Type 2 diabetes mellitus with hypoglycemia without coma: Secondary | ICD-10-CM | POA: Diagnosis not present

## 2016-07-23 DIAGNOSIS — E871 Hypo-osmolality and hyponatremia: Secondary | ICD-10-CM | POA: Diagnosis not present

## 2016-07-23 DIAGNOSIS — J449 Chronic obstructive pulmonary disease, unspecified: Secondary | ICD-10-CM | POA: Diagnosis not present

## 2016-07-23 DIAGNOSIS — I2510 Atherosclerotic heart disease of native coronary artery without angina pectoris: Secondary | ICD-10-CM | POA: Diagnosis not present

## 2016-07-23 DIAGNOSIS — I509 Heart failure, unspecified: Secondary | ICD-10-CM | POA: Diagnosis not present

## 2016-07-23 DIAGNOSIS — I110 Hypertensive heart disease with heart failure: Secondary | ICD-10-CM | POA: Diagnosis not present

## 2016-07-23 DIAGNOSIS — J9811 Atelectasis: Secondary | ICD-10-CM | POA: Diagnosis not present

## 2016-07-23 DIAGNOSIS — Z780 Asymptomatic menopausal state: Secondary | ICD-10-CM | POA: Diagnosis not present

## 2016-07-23 DIAGNOSIS — Z8673 Personal history of transient ischemic attack (TIA), and cerebral infarction without residual deficits: Secondary | ICD-10-CM | POA: Diagnosis not present

## 2016-07-23 DIAGNOSIS — S31109A Unspecified open wound of abdominal wall, unspecified quadrant without penetration into peritoneal cavity, initial encounter: Secondary | ICD-10-CM | POA: Diagnosis not present

## 2016-07-23 DIAGNOSIS — I252 Old myocardial infarction: Secondary | ICD-10-CM | POA: Diagnosis not present

## 2016-07-23 DIAGNOSIS — Z8249 Family history of ischemic heart disease and other diseases of the circulatory system: Secondary | ICD-10-CM | POA: Diagnosis not present

## 2016-07-25 DIAGNOSIS — M1712 Unilateral primary osteoarthritis, left knee: Secondary | ICD-10-CM | POA: Diagnosis not present

## 2016-07-25 DIAGNOSIS — J449 Chronic obstructive pulmonary disease, unspecified: Secondary | ICD-10-CM | POA: Diagnosis not present

## 2016-07-25 DIAGNOSIS — M542 Cervicalgia: Secondary | ICD-10-CM | POA: Diagnosis not present

## 2016-07-26 DIAGNOSIS — E782 Mixed hyperlipidemia: Secondary | ICD-10-CM | POA: Diagnosis not present

## 2016-07-26 DIAGNOSIS — I420 Dilated cardiomyopathy: Secondary | ICD-10-CM | POA: Diagnosis not present

## 2016-07-26 DIAGNOSIS — R0609 Other forms of dyspnea: Secondary | ICD-10-CM | POA: Diagnosis not present

## 2016-07-26 DIAGNOSIS — I209 Angina pectoris, unspecified: Secondary | ICD-10-CM | POA: Diagnosis not present

## 2016-07-27 DIAGNOSIS — I2510 Atherosclerotic heart disease of native coronary artery without angina pectoris: Secondary | ICD-10-CM | POA: Diagnosis not present

## 2016-07-27 DIAGNOSIS — T8189XA Other complications of procedures, not elsewhere classified, initial encounter: Secondary | ICD-10-CM | POA: Diagnosis not present

## 2016-07-27 DIAGNOSIS — E063 Autoimmune thyroiditis: Secondary | ICD-10-CM | POA: Diagnosis not present

## 2016-07-27 DIAGNOSIS — E271 Primary adrenocortical insufficiency: Secondary | ICD-10-CM | POA: Diagnosis not present

## 2016-07-27 DIAGNOSIS — T8131XA Disruption of external operation (surgical) wound, not elsewhere classified, initial encounter: Secondary | ICD-10-CM | POA: Diagnosis not present

## 2016-07-27 DIAGNOSIS — E119 Type 2 diabetes mellitus without complications: Secondary | ICD-10-CM | POA: Diagnosis not present

## 2016-07-27 DIAGNOSIS — I509 Heart failure, unspecified: Secondary | ICD-10-CM | POA: Diagnosis not present

## 2016-07-27 DIAGNOSIS — E236 Other disorders of pituitary gland: Secondary | ICD-10-CM | POA: Diagnosis not present

## 2016-07-27 DIAGNOSIS — E785 Hyperlipidemia, unspecified: Secondary | ICD-10-CM | POA: Diagnosis not present

## 2016-07-27 DIAGNOSIS — T888 Other specified complications of surgical and medical care, not elsewhere classified: Secondary | ICD-10-CM | POA: Diagnosis not present

## 2016-07-27 DIAGNOSIS — M3500 Sicca syndrome, unspecified: Secondary | ICD-10-CM | POA: Diagnosis not present

## 2016-07-27 DIAGNOSIS — J449 Chronic obstructive pulmonary disease, unspecified: Secondary | ICD-10-CM | POA: Diagnosis not present

## 2016-07-27 DIAGNOSIS — I110 Hypertensive heart disease with heart failure: Secondary | ICD-10-CM | POA: Diagnosis not present

## 2016-07-27 DIAGNOSIS — M542 Cervicalgia: Secondary | ICD-10-CM | POA: Diagnosis not present

## 2016-07-27 DIAGNOSIS — M1991 Primary osteoarthritis, unspecified site: Secondary | ICD-10-CM | POA: Diagnosis not present

## 2016-07-30 DIAGNOSIS — M5412 Radiculopathy, cervical region: Secondary | ICD-10-CM | POA: Diagnosis not present

## 2016-07-30 DIAGNOSIS — M542 Cervicalgia: Secondary | ICD-10-CM | POA: Diagnosis not present

## 2016-08-02 DIAGNOSIS — M542 Cervicalgia: Secondary | ICD-10-CM | POA: Diagnosis not present

## 2016-08-04 DIAGNOSIS — K432 Incisional hernia without obstruction or gangrene: Secondary | ICD-10-CM | POA: Diagnosis not present

## 2016-08-05 DIAGNOSIS — Z09 Encounter for follow-up examination after completed treatment for conditions other than malignant neoplasm: Secondary | ICD-10-CM | POA: Diagnosis not present

## 2016-08-05 DIAGNOSIS — T8189XD Other complications of procedures, not elsewhere classified, subsequent encounter: Secondary | ICD-10-CM | POA: Diagnosis not present

## 2016-08-05 DIAGNOSIS — F17210 Nicotine dependence, cigarettes, uncomplicated: Secondary | ICD-10-CM | POA: Diagnosis not present

## 2016-08-09 DIAGNOSIS — N760 Acute vaginitis: Secondary | ICD-10-CM | POA: Diagnosis not present

## 2016-08-09 DIAGNOSIS — N898 Other specified noninflammatory disorders of vagina: Secondary | ICD-10-CM | POA: Diagnosis not present

## 2016-08-09 DIAGNOSIS — Z6835 Body mass index (BMI) 35.0-35.9, adult: Secondary | ICD-10-CM | POA: Diagnosis not present

## 2016-08-09 DIAGNOSIS — N950 Postmenopausal bleeding: Secondary | ICD-10-CM | POA: Diagnosis not present

## 2016-08-09 DIAGNOSIS — F513 Sleepwalking [somnambulism]: Secondary | ICD-10-CM | POA: Diagnosis not present

## 2016-08-09 DIAGNOSIS — M79606 Pain in leg, unspecified: Secondary | ICD-10-CM | POA: Diagnosis not present

## 2016-08-09 DIAGNOSIS — D649 Anemia, unspecified: Secondary | ICD-10-CM | POA: Diagnosis not present

## 2016-08-09 DIAGNOSIS — L98492 Non-pressure chronic ulcer of skin of other sites with fat layer exposed: Secondary | ICD-10-CM | POA: Diagnosis not present

## 2016-08-09 DIAGNOSIS — B9689 Other specified bacterial agents as the cause of diseases classified elsewhere: Secondary | ICD-10-CM | POA: Diagnosis not present

## 2016-08-13 DIAGNOSIS — J181 Lobar pneumonia, unspecified organism: Secondary | ICD-10-CM | POA: Diagnosis not present

## 2016-08-13 DIAGNOSIS — R0602 Shortness of breath: Secondary | ICD-10-CM | POA: Diagnosis not present

## 2016-08-13 DIAGNOSIS — R05 Cough: Secondary | ICD-10-CM | POA: Diagnosis not present

## 2016-08-13 DIAGNOSIS — I472 Ventricular tachycardia: Secondary | ICD-10-CM | POA: Diagnosis not present

## 2016-08-13 DIAGNOSIS — L03116 Cellulitis of left lower limb: Secondary | ICD-10-CM | POA: Diagnosis not present

## 2016-08-14 DIAGNOSIS — I2510 Atherosclerotic heart disease of native coronary artery without angina pectoris: Secondary | ICD-10-CM | POA: Diagnosis not present

## 2016-08-14 DIAGNOSIS — I249 Acute ischemic heart disease, unspecified: Secondary | ICD-10-CM | POA: Diagnosis not present

## 2016-08-14 DIAGNOSIS — L03116 Cellulitis of left lower limb: Secondary | ICD-10-CM | POA: Diagnosis not present

## 2016-08-14 DIAGNOSIS — E1169 Type 2 diabetes mellitus with other specified complication: Secondary | ICD-10-CM | POA: Diagnosis not present

## 2016-08-14 DIAGNOSIS — I472 Ventricular tachycardia: Secondary | ICD-10-CM | POA: Diagnosis not present

## 2016-08-14 DIAGNOSIS — J181 Lobar pneumonia, unspecified organism: Secondary | ICD-10-CM | POA: Diagnosis not present

## 2016-08-14 DIAGNOSIS — J209 Acute bronchitis, unspecified: Secondary | ICD-10-CM | POA: Diagnosis not present

## 2016-08-14 DIAGNOSIS — R9431 Abnormal electrocardiogram [ECG] [EKG]: Secondary | ICD-10-CM | POA: Diagnosis not present

## 2016-08-14 DIAGNOSIS — I252 Old myocardial infarction: Secondary | ICD-10-CM | POA: Diagnosis not present

## 2016-08-15 DIAGNOSIS — J181 Lobar pneumonia, unspecified organism: Secondary | ICD-10-CM | POA: Diagnosis not present

## 2016-08-15 DIAGNOSIS — Z79899 Other long term (current) drug therapy: Secondary | ICD-10-CM | POA: Diagnosis not present

## 2016-08-15 DIAGNOSIS — F17210 Nicotine dependence, cigarettes, uncomplicated: Secondary | ICD-10-CM | POA: Diagnosis not present

## 2016-08-15 DIAGNOSIS — Z794 Long term (current) use of insulin: Secondary | ICD-10-CM | POA: Diagnosis not present

## 2016-08-15 DIAGNOSIS — J449 Chronic obstructive pulmonary disease, unspecified: Secondary | ICD-10-CM | POA: Diagnosis not present

## 2016-08-15 DIAGNOSIS — E1169 Type 2 diabetes mellitus with other specified complication: Secondary | ICD-10-CM | POA: Diagnosis not present

## 2016-08-15 DIAGNOSIS — Z22322 Carrier or suspected carrier of Methicillin resistant Staphylococcus aureus: Secondary | ICD-10-CM | POA: Diagnosis not present

## 2016-08-15 DIAGNOSIS — I472 Ventricular tachycardia: Secondary | ICD-10-CM | POA: Diagnosis not present

## 2016-08-15 DIAGNOSIS — I5022 Chronic systolic (congestive) heart failure: Secondary | ICD-10-CM | POA: Diagnosis not present

## 2016-08-15 DIAGNOSIS — E669 Obesity, unspecified: Secondary | ICD-10-CM | POA: Diagnosis not present

## 2016-08-15 DIAGNOSIS — L03116 Cellulitis of left lower limb: Secondary | ICD-10-CM | POA: Diagnosis not present

## 2016-08-15 DIAGNOSIS — M7989 Other specified soft tissue disorders: Secondary | ICD-10-CM | POA: Diagnosis not present

## 2016-08-15 DIAGNOSIS — J209 Acute bronchitis, unspecified: Secondary | ICD-10-CM | POA: Diagnosis not present

## 2016-08-15 DIAGNOSIS — I130 Hypertensive heart and chronic kidney disease with heart failure and stage 1 through stage 4 chronic kidney disease, or unspecified chronic kidney disease: Secondary | ICD-10-CM | POA: Diagnosis not present

## 2016-08-15 DIAGNOSIS — I2510 Atherosclerotic heart disease of native coronary artery without angina pectoris: Secondary | ICD-10-CM | POA: Diagnosis not present

## 2016-08-15 DIAGNOSIS — I252 Old myocardial infarction: Secondary | ICD-10-CM | POA: Diagnosis not present

## 2016-08-15 DIAGNOSIS — I10 Essential (primary) hypertension: Secondary | ICD-10-CM | POA: Diagnosis not present

## 2016-08-15 DIAGNOSIS — E1122 Type 2 diabetes mellitus with diabetic chronic kidney disease: Secondary | ICD-10-CM | POA: Diagnosis not present

## 2016-08-15 DIAGNOSIS — J440 Chronic obstructive pulmonary disease with acute lower respiratory infection: Secondary | ICD-10-CM | POA: Diagnosis not present

## 2016-08-15 DIAGNOSIS — R079 Chest pain, unspecified: Secondary | ICD-10-CM | POA: Diagnosis not present

## 2016-08-15 DIAGNOSIS — N183 Chronic kidney disease, stage 3 (moderate): Secondary | ICD-10-CM | POA: Diagnosis not present

## 2016-08-16 DIAGNOSIS — J209 Acute bronchitis, unspecified: Secondary | ICD-10-CM | POA: Diagnosis not present

## 2016-08-16 DIAGNOSIS — E1169 Type 2 diabetes mellitus with other specified complication: Secondary | ICD-10-CM | POA: Diagnosis not present

## 2016-08-16 DIAGNOSIS — I472 Ventricular tachycardia: Secondary | ICD-10-CM | POA: Diagnosis not present

## 2016-08-16 DIAGNOSIS — J181 Lobar pneumonia, unspecified organism: Secondary | ICD-10-CM | POA: Diagnosis not present

## 2016-08-16 DIAGNOSIS — M7989 Other specified soft tissue disorders: Secondary | ICD-10-CM | POA: Diagnosis not present

## 2016-08-16 DIAGNOSIS — J449 Chronic obstructive pulmonary disease, unspecified: Secondary | ICD-10-CM | POA: Diagnosis not present

## 2016-08-17 DIAGNOSIS — F17210 Nicotine dependence, cigarettes, uncomplicated: Secondary | ICD-10-CM | POA: Diagnosis not present

## 2016-08-17 DIAGNOSIS — I10 Essential (primary) hypertension: Secondary | ICD-10-CM

## 2016-08-17 DIAGNOSIS — I2510 Atherosclerotic heart disease of native coronary artery without angina pectoris: Secondary | ICD-10-CM | POA: Diagnosis not present

## 2016-08-17 DIAGNOSIS — E669 Obesity, unspecified: Secondary | ICD-10-CM

## 2016-08-17 DIAGNOSIS — J181 Lobar pneumonia, unspecified organism: Secondary | ICD-10-CM | POA: Diagnosis not present

## 2016-08-17 DIAGNOSIS — J209 Acute bronchitis, unspecified: Secondary | ICD-10-CM | POA: Diagnosis not present

## 2016-08-17 DIAGNOSIS — E1169 Type 2 diabetes mellitus with other specified complication: Secondary | ICD-10-CM | POA: Diagnosis not present

## 2016-08-17 DIAGNOSIS — R079 Chest pain, unspecified: Secondary | ICD-10-CM | POA: Diagnosis not present

## 2016-08-17 DIAGNOSIS — I472 Ventricular tachycardia: Secondary | ICD-10-CM | POA: Diagnosis not present

## 2016-08-23 DIAGNOSIS — Z6836 Body mass index (BMI) 36.0-36.9, adult: Secondary | ICD-10-CM | POA: Diagnosis not present

## 2016-08-23 DIAGNOSIS — J189 Pneumonia, unspecified organism: Secondary | ICD-10-CM | POA: Diagnosis not present

## 2016-08-23 DIAGNOSIS — D649 Anemia, unspecified: Secondary | ICD-10-CM | POA: Diagnosis not present

## 2016-08-24 DIAGNOSIS — I420 Dilated cardiomyopathy: Secondary | ICD-10-CM | POA: Diagnosis not present

## 2016-08-24 DIAGNOSIS — R911 Solitary pulmonary nodule: Secondary | ICD-10-CM | POA: Diagnosis not present

## 2016-08-24 DIAGNOSIS — F17210 Nicotine dependence, cigarettes, uncomplicated: Secondary | ICD-10-CM | POA: Diagnosis not present

## 2016-08-24 DIAGNOSIS — J449 Chronic obstructive pulmonary disease, unspecified: Secondary | ICD-10-CM | POA: Diagnosis not present

## 2016-08-24 DIAGNOSIS — J189 Pneumonia, unspecified organism: Secondary | ICD-10-CM | POA: Diagnosis not present

## 2016-08-25 DIAGNOSIS — N950 Postmenopausal bleeding: Secondary | ICD-10-CM | POA: Diagnosis not present

## 2016-08-29 DIAGNOSIS — K430 Incisional hernia with obstruction, without gangrene: Secondary | ICD-10-CM | POA: Diagnosis not present

## 2016-08-29 DIAGNOSIS — T8131XD Disruption of external operation (surgical) wound, not elsewhere classified, subsequent encounter: Secondary | ICD-10-CM | POA: Diagnosis not present

## 2016-08-30 DIAGNOSIS — R109 Unspecified abdominal pain: Secondary | ICD-10-CM | POA: Diagnosis not present

## 2016-08-30 DIAGNOSIS — J449 Chronic obstructive pulmonary disease, unspecified: Secondary | ICD-10-CM | POA: Diagnosis not present

## 2016-08-30 DIAGNOSIS — Z6836 Body mass index (BMI) 36.0-36.9, adult: Secondary | ICD-10-CM | POA: Diagnosis not present

## 2016-08-31 DIAGNOSIS — R109 Unspecified abdominal pain: Secondary | ICD-10-CM | POA: Insufficient documentation

## 2016-08-31 DIAGNOSIS — N3281 Overactive bladder: Secondary | ICD-10-CM | POA: Diagnosis not present

## 2016-09-01 DIAGNOSIS — M47812 Spondylosis without myelopathy or radiculopathy, cervical region: Secondary | ICD-10-CM | POA: Diagnosis not present

## 2016-09-01 DIAGNOSIS — M542 Cervicalgia: Secondary | ICD-10-CM | POA: Diagnosis not present

## 2016-09-02 DIAGNOSIS — J310 Chronic rhinitis: Secondary | ICD-10-CM | POA: Diagnosis not present

## 2016-09-08 DIAGNOSIS — G43009 Migraine without aura, not intractable, without status migrainosus: Secondary | ICD-10-CM | POA: Diagnosis not present

## 2016-09-08 DIAGNOSIS — M542 Cervicalgia: Secondary | ICD-10-CM | POA: Insufficient documentation

## 2016-09-08 DIAGNOSIS — R202 Paresthesia of skin: Secondary | ICD-10-CM | POA: Diagnosis not present

## 2016-09-08 DIAGNOSIS — M3500 Sicca syndrome, unspecified: Secondary | ICD-10-CM | POA: Diagnosis not present

## 2016-09-09 DIAGNOSIS — D649 Anemia, unspecified: Secondary | ICD-10-CM | POA: Diagnosis not present

## 2016-09-09 DIAGNOSIS — E1165 Type 2 diabetes mellitus with hyperglycemia: Secondary | ICD-10-CM | POA: Diagnosis not present

## 2016-09-09 DIAGNOSIS — I110 Hypertensive heart disease with heart failure: Secondary | ICD-10-CM | POA: Diagnosis not present

## 2016-09-09 DIAGNOSIS — M5412 Radiculopathy, cervical region: Secondary | ICD-10-CM | POA: Diagnosis not present

## 2016-09-09 DIAGNOSIS — E785 Hyperlipidemia, unspecified: Secondary | ICD-10-CM | POA: Diagnosis not present

## 2016-09-14 DIAGNOSIS — E2749 Other adrenocortical insufficiency: Secondary | ICD-10-CM | POA: Diagnosis not present

## 2016-09-14 DIAGNOSIS — E039 Hypothyroidism, unspecified: Secondary | ICD-10-CM | POA: Diagnosis not present

## 2016-09-14 DIAGNOSIS — Z79899 Other long term (current) drug therapy: Secondary | ICD-10-CM | POA: Diagnosis not present

## 2016-09-15 DIAGNOSIS — Z79899 Other long term (current) drug therapy: Secondary | ICD-10-CM | POA: Diagnosis not present

## 2016-09-15 DIAGNOSIS — Z6836 Body mass index (BMI) 36.0-36.9, adult: Secondary | ICD-10-CM | POA: Diagnosis not present

## 2016-09-15 DIAGNOSIS — R52 Pain, unspecified: Secondary | ICD-10-CM | POA: Diagnosis not present

## 2016-09-15 DIAGNOSIS — Z01818 Encounter for other preprocedural examination: Secondary | ICD-10-CM | POA: Diagnosis not present

## 2016-09-15 DIAGNOSIS — E559 Vitamin D deficiency, unspecified: Secondary | ICD-10-CM | POA: Diagnosis not present

## 2016-09-15 DIAGNOSIS — M79603 Pain in arm, unspecified: Secondary | ICD-10-CM | POA: Diagnosis not present

## 2016-09-16 DIAGNOSIS — R911 Solitary pulmonary nodule: Secondary | ICD-10-CM | POA: Diagnosis not present

## 2016-09-16 DIAGNOSIS — F17210 Nicotine dependence, cigarettes, uncomplicated: Secondary | ICD-10-CM | POA: Diagnosis not present

## 2016-09-16 DIAGNOSIS — J449 Chronic obstructive pulmonary disease, unspecified: Secondary | ICD-10-CM | POA: Diagnosis not present

## 2016-09-16 DIAGNOSIS — G4733 Obstructive sleep apnea (adult) (pediatric): Secondary | ICD-10-CM | POA: Diagnosis not present

## 2016-09-21 DIAGNOSIS — Z6836 Body mass index (BMI) 36.0-36.9, adult: Secondary | ICD-10-CM | POA: Diagnosis not present

## 2016-09-21 DIAGNOSIS — I2510 Atherosclerotic heart disease of native coronary artery without angina pectoris: Secondary | ICD-10-CM | POA: Diagnosis not present

## 2016-09-21 DIAGNOSIS — N3281 Overactive bladder: Secondary | ICD-10-CM | POA: Diagnosis not present

## 2016-09-21 DIAGNOSIS — R109 Unspecified abdominal pain: Secondary | ICD-10-CM | POA: Diagnosis not present

## 2016-09-21 DIAGNOSIS — N200 Calculus of kidney: Secondary | ICD-10-CM | POA: Diagnosis not present

## 2016-09-21 DIAGNOSIS — I5022 Chronic systolic (congestive) heart failure: Secondary | ICD-10-CM | POA: Diagnosis not present

## 2016-09-21 DIAGNOSIS — I255 Ischemic cardiomyopathy: Secondary | ICD-10-CM | POA: Diagnosis not present

## 2016-09-21 DIAGNOSIS — R0602 Shortness of breath: Secondary | ICD-10-CM | POA: Diagnosis not present

## 2016-09-23 DIAGNOSIS — M79601 Pain in right arm: Secondary | ICD-10-CM | POA: Diagnosis not present

## 2016-09-24 DIAGNOSIS — J449 Chronic obstructive pulmonary disease, unspecified: Secondary | ICD-10-CM | POA: Diagnosis not present

## 2016-09-29 DIAGNOSIS — Z6835 Body mass index (BMI) 35.0-35.9, adult: Secondary | ICD-10-CM | POA: Diagnosis not present

## 2016-09-29 DIAGNOSIS — Z09 Encounter for follow-up examination after completed treatment for conditions other than malignant neoplasm: Secondary | ICD-10-CM | POA: Diagnosis not present

## 2016-09-29 DIAGNOSIS — Z8679 Personal history of other diseases of the circulatory system: Secondary | ICD-10-CM | POA: Insufficient documentation

## 2016-09-29 DIAGNOSIS — R0609 Other forms of dyspnea: Secondary | ICD-10-CM

## 2016-09-29 DIAGNOSIS — R0600 Dyspnea, unspecified: Secondary | ICD-10-CM | POA: Insufficient documentation

## 2016-09-30 ENCOUNTER — Ambulatory Visit: Payer: Federal, State, Local not specified - PPO | Admitting: Cardiology

## 2016-09-30 DIAGNOSIS — Z01818 Encounter for other preprocedural examination: Secondary | ICD-10-CM | POA: Diagnosis not present

## 2016-09-30 DIAGNOSIS — J310 Chronic rhinitis: Secondary | ICD-10-CM | POA: Diagnosis not present

## 2016-09-30 DIAGNOSIS — M5412 Radiculopathy, cervical region: Secondary | ICD-10-CM | POA: Diagnosis not present

## 2016-09-30 DIAGNOSIS — M47812 Spondylosis without myelopathy or radiculopathy, cervical region: Secondary | ICD-10-CM | POA: Diagnosis not present

## 2016-10-05 ENCOUNTER — Encounter: Payer: Self-pay | Admitting: Cardiology

## 2016-10-05 ENCOUNTER — Ambulatory Visit (INDEPENDENT_AMBULATORY_CARE_PROVIDER_SITE_OTHER): Payer: Federal, State, Local not specified - PPO | Admitting: Cardiology

## 2016-10-05 VITALS — BP 112/62 | HR 60 | Resp 12 | Ht 62.0 in | Wt 198.8 lb

## 2016-10-05 DIAGNOSIS — E782 Mixed hyperlipidemia: Secondary | ICD-10-CM

## 2016-10-05 DIAGNOSIS — I5043 Acute on chronic combined systolic (congestive) and diastolic (congestive) heart failure: Secondary | ICD-10-CM

## 2016-10-05 DIAGNOSIS — I25119 Atherosclerotic heart disease of native coronary artery with unspecified angina pectoris: Secondary | ICD-10-CM

## 2016-10-05 DIAGNOSIS — I5022 Chronic systolic (congestive) heart failure: Secondary | ICD-10-CM | POA: Diagnosis not present

## 2016-10-05 DIAGNOSIS — I10 Essential (primary) hypertension: Secondary | ICD-10-CM

## 2016-10-05 DIAGNOSIS — I2510 Atherosclerotic heart disease of native coronary artery without angina pectoris: Secondary | ICD-10-CM

## 2016-10-05 DIAGNOSIS — I255 Ischemic cardiomyopathy: Secondary | ICD-10-CM

## 2016-10-05 NOTE — Progress Notes (Signed)
Cardiology Office Note:    Date:  10/05/2016   ID:  Crystal Yates, DOB 06-25-1955, MRN [id number]  PCP:  Dortha Kern, PA  Cardiologist:  Jenne Campus, MD    Referring MD: Dortha Kern, [place]   Chief Complaint  Patient presents with  . Follow-up  Chief complaint is she would like to be evaluated before her spine surgery, chest pain.  History of Present Illness:    Crystal Yates is a 61 y.o. female  with very complex past medical history. Last intervention on her coronary artery tree was done in January 2018 when she end up having stents into the right coronary artery. That was in the face of stable angina pectoris. Stress test was abnormal and cardiac catheterization was done that led to angioplasty.. She is scheduled to have C-spine surgery under general anesthesia next Monday. She already seen  cardiologist in [facility] who clear her for surgery however she requested to be seen because she is complaining of having more shortness of breath as well as described as tightness in the chest. She has difficulty describing the sensation she said it lasts for a few minutes sometimes it is related to exertion. It is difficult for her to distinguish if this is shortness of breath or chest pain but she is very concerned about it and she actually wanted to be checked before surgery. Otherwise denies having palpitations tightness squeezing pressure burning in chest.  Past Medical History:  Diagnosis Date  . Diabetes mellitus without complication ([place])   . Hyperlipidemia   . Hypertension   . Thyroid disease     Past Surgical History:  Procedure Laterality Date  . ABDOMINAL SURGERY    . CESAREAN SECTION    . CHOLECYSTECTOMY    . COLON RESECTION    . HERNIA MESH REMOVAL    . HERNIA REPAIR    . SHOULDER ARTHROSCOPY    . TUBAL LIGATION      Current Medications: Current Meds  Medication Sig  . aspirin EC 81 MG tablet Take 81 mg by mouth.  Marland Kitchen atorvastatin (LIPITOR) 20 MG tablet Take  20 mg by mouth daily.  . bumetanide (BUMEX) 1 MG tablet Take 1 mg by mouth daily.  . carvedilol (COREG) 3.125 MG tablet Take 3.125 mg by mouth 2 (two) times daily with a meal.  . Cholecalciferol (VITAMIN D3) 2000 units capsule Take by mouth daily.  . clopidogrel (PLAVIX) 75 MG tablet Take 75 mg by mouth daily.  . Dulaglutide (TRULICITY) 6.27 OJ/5.0KX SOPN Inject 0.75 mg into the skin daily.  Marland Kitchen ENTRESTO 49-51 MG Take 1 tablet by mouth 2 (two) times daily.  Marland Kitchen levothyroxine (SYNTHROID) 125 MCG tablet Take 1 tablet by mouth daily.  Marland Kitchen lidocaine (XYLOCAINE) 5 % ointment Apply 1 application topically as needed for pain.  . Multiple Vitamins-Minerals (EMERGEN-C VITAMIN C) PACK Take 500 mg by mouth.  . nitroGLYCERIN (NITROSTAT) 0.4 MG SL tablet Place 0.4 mg under the tongue as needed for chest pain.  Marland Kitchen ondansetron (ZOFRAN-ODT) 4 MG disintegrating tablet Take 4 mg by mouth as needed for nausea.  . pramipexole (MIRAPEX) 1 MG tablet Take 1 mg by mouth 2 (two) times daily.  Marland Kitchen topiramate (TOPAMAX) 25 MG tablet Take 150 mg by mouth daily.  . traZODone (DESYREL) 50 MG tablet Take 50 mg by mouth daily.  Marland Kitchen umeclidinium-vilanterol (ANORO ELLIPTA) 62.5-25 MCG/INH AEPB Inhale 1 puff into the lungs daily.     Allergies:   Hydroxychloroquine; Donepezil; Prednisone; Bupropion; Contrast media [iodinated diagnostic  agents]; and Varenicline   Social History   Social History  . Marital status: Married    Spouse name: N/A  . Number of children: N/A  . Years of education: N/A   Social History Main Topics  . Smoking status: Current Every Day Smoker    Types: Cigarettes  . Smokeless tobacco: Never Used  . Alcohol use Yes  . Drug use: No  . Sexual activity: Not Asked   Other Topics Concern  . None   Social History Narrative  . None     Family History: The patient's family history includes Diabetes in her father, sister, sister, and son; Stroke in her mother. ROS:   Please see the history of present  illness.     All 12 systems reviewed and are negative except as described per history of present illness.  EKGs/Labs/Other Studies Reviewed:      Recent Labs: No results found for requested labs within last 8760 hours.  Recent Lipid Panel No results found for: CHOL, TRIG, HDL, CHOLHDL, VLDL, LDLCALC, LDLDIRECT  Physical Exam:    VS:  BP 112/62   Pulse 60   Resp 12   Ht 5\' 2"  (1.575 m)   Wt 198 lb 12.8 oz (90.2 kg)   BMI 36.36 kg/m     Wt Readings from Last 3 Encounters:  10/05/16 198 lb 12.8 oz (90.2 kg)     GEN:  Well nourished, well developed in no acute distress HEENT: Normal NECK: No JVD; No carotid bruits LYMPHATICS: No lymphadenopathy CARDIAC: RRR, no murmurs, no rubs, no gallops RESPIRATORY:  Clear to auscultation without rales, wheezing or rhonchi  ABDOMEN: Soft, non-tender, non-distended MUSCULOSKELETAL:  No edema; No deformity  SKIN: Warm and dry LOWER EXTREMITIES: no swelling NEUROLOGIC:  Alert and oriented x 3 PSYCHIATRIC:  Normal affect   ASSESSMENT:    1. Acute on chronic combined systolic and diastolic CHF (congestive heart failure) ([place])   2. Coronary artery disease involving native heart with angina pectoris, unspecified vessel or lesion type ([place])   3. Ischemic cardiomyopathy   4. Chronic systolic heart failure ([place])   5. Essential hypertension   6. Coronary artery disease involving native coronary artery of native heart without angina pectoris   7. Mixed hyperlipidemia    PLAN:    In order of problems listed above:  1. Coronary artery disease: Very complicated situation. She had angioplasty of right coronary artery in January 2018. She had a stress distally May which showed no evidence of ischemia but she comes with new chest pain. Considering issue with her daily as well as recent angioplasty I think we need to perform stress test to see if there is an inducible ischemia. I will postpone surgery until we have the situation clear. Already for  more the message to her surgeon regarding that issue. I asked her to take nitroglycerin when she needs to. She is taking aspirin asked her to go back on Plavix. 2. Dyspnea on exertion: Is an old problem but seems to be worse right now. I asked her to have      ProBNP done try to figure out if this is related to her heart more to COPD that she does have. 3. Essential hypertension: Well-controlled continue present management. 4. Dyslipidemia: On statin which I will continue.  This is a very complicated situation this very complex patient. I will proceed with stress testing. I see her back in about 2 weeks.   Medication Adjustments/Labs and Tests Ordered: Current medicines  are reviewed at length with the patient today.  Concerns regarding medicines are outlined above.  Orders Placed This Encounter  Procedures  . Basic Metabolic Panel (BMET)  . Pro b natriuretic peptide  . Myocardial Perfusion Imaging   Medication changes: No orders of the defined types were placed in this encounter.   Signed, Park Liter, MD, [facility] 10/05/2016 10:35 AM    [place]

## 2016-10-05 NOTE — Patient Instructions (Addendum)
Medication Instructions:  Your physician recommends that you continue on your current medications as directed. Please refer to the Current Medication list given to you today.  Labwork: Your physician recommends that you return for lab work today for BMP and Pro-BNP   Testing/Procedures: Your physician has requested that you have a lexiscan myoview. For further information please visit [email or web address]. Please follow instruction sheet, as given. We will schedule you at [facility], and contact you with the time and date.    Follow-Up: Your physician recommends that you schedule a follow-up appointment in: 2-3 weeks with Dr. Agustin Cree  Any Other Special Instructions Will Be Listed Below (If Applicable).   If you need a refill on your cardiac medications before your next appointment, please call your pharmacy.

## 2016-10-06 ENCOUNTER — Other Ambulatory Visit: Payer: Self-pay

## 2016-10-06 DIAGNOSIS — Z79899 Other long term (current) drug therapy: Secondary | ICD-10-CM

## 2016-10-06 DIAGNOSIS — K432 Incisional hernia without obstruction or gangrene: Secondary | ICD-10-CM | POA: Diagnosis not present

## 2016-10-06 LAB — PRO B NATRIURETIC PEPTIDE: NT-Pro BNP: 833 pg/mL — ABNORMAL HIGH (ref 0–287)

## 2016-10-06 MED ORDER — FUROSEMIDE 20 MG PO TABS
20.0000 mg | ORAL_TABLET | Freq: Every day | ORAL | 6 refills | Status: DC
Start: 2016-10-06 — End: 2016-10-14

## 2016-10-06 NOTE — Progress Notes (Signed)
Pt is aware of medication start and recheck of labs in 1 week.

## 2016-10-10 DIAGNOSIS — M79641 Pain in right hand: Secondary | ICD-10-CM | POA: Diagnosis not present

## 2016-10-10 DIAGNOSIS — M79631 Pain in right forearm: Secondary | ICD-10-CM | POA: Diagnosis not present

## 2016-10-11 ENCOUNTER — Encounter: Payer: Self-pay | Admitting: Cardiology

## 2016-10-11 DIAGNOSIS — R079 Chest pain, unspecified: Secondary | ICD-10-CM | POA: Diagnosis not present

## 2016-10-11 DIAGNOSIS — I255 Ischemic cardiomyopathy: Secondary | ICD-10-CM | POA: Diagnosis not present

## 2016-10-11 DIAGNOSIS — I5043 Acute on chronic combined systolic (congestive) and diastolic (congestive) heart failure: Secondary | ICD-10-CM | POA: Diagnosis not present

## 2016-10-11 DIAGNOSIS — I25119 Atherosclerotic heart disease of native coronary artery with unspecified angina pectoris: Secondary | ICD-10-CM | POA: Diagnosis not present

## 2016-10-12 ENCOUNTER — Encounter (HOSPITAL_COMMUNITY): Payer: Self-pay | Admitting: *Deleted

## 2016-10-12 ENCOUNTER — Other Ambulatory Visit: Payer: Self-pay

## 2016-10-12 ENCOUNTER — Observation Stay (HOSPITAL_COMMUNITY)
Admission: EM | Admit: 2016-10-12 | Discharge: 2016-10-14 | Disposition: A | Discharge status: Home / Self Care | Payer: Federal, State, Local not specified - PPO | Attending: Internal Medicine | Admitting: Internal Medicine

## 2016-10-12 ENCOUNTER — Emergency Department (HOSPITAL_COMMUNITY): Payer: Federal, State, Local not specified - PPO

## 2016-10-12 DIAGNOSIS — I5043 Acute on chronic combined systolic (congestive) and diastolic (congestive) heart failure: Secondary | ICD-10-CM | POA: Diagnosis not present

## 2016-10-12 DIAGNOSIS — M5137 Other intervertebral disc degeneration, lumbosacral region: Secondary | ICD-10-CM | POA: Insufficient documentation

## 2016-10-12 DIAGNOSIS — Z720 Tobacco use: Secondary | ICD-10-CM | POA: Diagnosis present

## 2016-10-12 DIAGNOSIS — I200 Unstable angina: Secondary | ICD-10-CM | POA: Diagnosis not present

## 2016-10-12 DIAGNOSIS — N3281 Overactive bladder: Secondary | ICD-10-CM | POA: Insufficient documentation

## 2016-10-12 DIAGNOSIS — I5042 Chronic combined systolic (congestive) and diastolic (congestive) heart failure: Secondary | ICD-10-CM | POA: Insufficient documentation

## 2016-10-12 DIAGNOSIS — I255 Ischemic cardiomyopathy: Secondary | ICD-10-CM | POA: Diagnosis not present

## 2016-10-12 DIAGNOSIS — R0600 Dyspnea, unspecified: Secondary | ICD-10-CM | POA: Diagnosis present

## 2016-10-12 DIAGNOSIS — R0602 Shortness of breath: Secondary | ICD-10-CM

## 2016-10-12 DIAGNOSIS — Y831 Surgical operation with implant of artificial internal device as the cause of abnormal reaction of the patient, or of later complication, without mention of misadventure at the time of the procedure: Secondary | ICD-10-CM | POA: Insufficient documentation

## 2016-10-12 DIAGNOSIS — T82855A Stenosis of coronary artery stent, initial encounter: Secondary | ICD-10-CM | POA: Diagnosis not present

## 2016-10-12 DIAGNOSIS — Z955 Presence of coronary angioplasty implant and graft: Secondary | ICD-10-CM

## 2016-10-12 DIAGNOSIS — Z7902 Long term (current) use of antithrombotics/antiplatelets: Secondary | ICD-10-CM | POA: Insufficient documentation

## 2016-10-12 DIAGNOSIS — E669 Obesity, unspecified: Secondary | ICD-10-CM | POA: Diagnosis not present

## 2016-10-12 DIAGNOSIS — E2740 Unspecified adrenocortical insufficiency: Secondary | ICD-10-CM | POA: Diagnosis present

## 2016-10-12 DIAGNOSIS — R0609 Other forms of dyspnea: Secondary | ICD-10-CM | POA: Diagnosis present

## 2016-10-12 DIAGNOSIS — I110 Hypertensive heart disease with heart failure: Secondary | ICD-10-CM | POA: Insufficient documentation

## 2016-10-12 DIAGNOSIS — Z794 Long term (current) use of insulin: Secondary | ICD-10-CM | POA: Diagnosis not present

## 2016-10-12 DIAGNOSIS — E063 Autoimmune thyroiditis: Secondary | ICD-10-CM | POA: Diagnosis not present

## 2016-10-12 DIAGNOSIS — R0789 Other chest pain: Secondary | ICD-10-CM | POA: Diagnosis not present

## 2016-10-12 DIAGNOSIS — E039 Hypothyroidism, unspecified: Secondary | ICD-10-CM | POA: Diagnosis present

## 2016-10-12 DIAGNOSIS — E119 Type 2 diabetes mellitus without complications: Secondary | ICD-10-CM | POA: Diagnosis not present

## 2016-10-12 DIAGNOSIS — F17210 Nicotine dependence, cigarettes, uncomplicated: Secondary | ICD-10-CM | POA: Diagnosis not present

## 2016-10-12 DIAGNOSIS — J441 Chronic obstructive pulmonary disease with (acute) exacerbation: Principal | ICD-10-CM | POA: Insufficient documentation

## 2016-10-12 DIAGNOSIS — E1142 Type 2 diabetes mellitus with diabetic polyneuropathy: Secondary | ICD-10-CM | POA: Insufficient documentation

## 2016-10-12 DIAGNOSIS — Z7982 Long term (current) use of aspirin: Secondary | ICD-10-CM | POA: Insufficient documentation

## 2016-10-12 DIAGNOSIS — Z91041 Radiographic dye allergy status: Secondary | ICD-10-CM | POA: Insufficient documentation

## 2016-10-12 DIAGNOSIS — J449 Chronic obstructive pulmonary disease, unspecified: Secondary | ICD-10-CM | POA: Diagnosis not present

## 2016-10-12 DIAGNOSIS — Z9049 Acquired absence of other specified parts of digestive tract: Secondary | ICD-10-CM | POA: Insufficient documentation

## 2016-10-12 DIAGNOSIS — R918 Other nonspecific abnormal finding of lung field: Secondary | ICD-10-CM | POA: Diagnosis not present

## 2016-10-12 DIAGNOSIS — I700 Atherosclerosis of aorta: Secondary | ICD-10-CM | POA: Insufficient documentation

## 2016-10-12 DIAGNOSIS — Z6835 Body mass index (BMI) 35.0-35.9, adult: Secondary | ICD-10-CM | POA: Insufficient documentation

## 2016-10-12 DIAGNOSIS — I493 Ventricular premature depolarization: Secondary | ICD-10-CM | POA: Insufficient documentation

## 2016-10-12 DIAGNOSIS — E785 Hyperlipidemia, unspecified: Secondary | ICD-10-CM | POA: Diagnosis not present

## 2016-10-12 DIAGNOSIS — M3500 Sicca syndrome, unspecified: Secondary | ICD-10-CM | POA: Diagnosis not present

## 2016-10-12 DIAGNOSIS — I2510 Atherosclerotic heart disease of native coronary artery without angina pectoris: Secondary | ICD-10-CM | POA: Diagnosis not present

## 2016-10-12 DIAGNOSIS — Z9981 Dependence on supplemental oxygen: Secondary | ICD-10-CM | POA: Insufficient documentation

## 2016-10-12 DIAGNOSIS — Z9289 Personal history of other medical treatment: Secondary | ICD-10-CM

## 2016-10-12 DIAGNOSIS — I10 Essential (primary) hypertension: Secondary | ICD-10-CM | POA: Diagnosis present

## 2016-10-12 HISTORY — DX: Chronic obstructive pulmonary disease, unspecified: J44.9

## 2016-10-12 HISTORY — DX: Chronic kidney disease, stage 2 (mild): N18.2

## 2016-10-12 HISTORY — DX: Chronic combined systolic (congestive) and diastolic (congestive) heart failure: I50.42

## 2016-10-12 HISTORY — DX: Tobacco use: Z72.0

## 2016-10-12 HISTORY — DX: Autoimmune thyroiditis: E06.3

## 2016-10-12 HISTORY — DX: Atherosclerotic heart disease of native coronary artery without angina pectoris: I25.10

## 2016-10-12 HISTORY — DX: Other adrenocortical insufficiency: E27.49

## 2016-10-12 LAB — CBC
HCT: 43.4 % (ref 36.0–46.0)
Hemoglobin: 14.5 g/dL (ref 12.0–15.0)
MCH: 31.7 pg (ref 26.0–34.0)
MCHC: 33.4 g/dL (ref 30.0–36.0)
MCV: 95 fL (ref 78.0–100.0)
Platelets: 231 10*3/uL (ref 150–400)
RBC: 4.57 MIL/uL (ref 3.87–5.11)
RDW: 15 % (ref 11.5–15.5)
WBC: 6.4 10*3/uL (ref 4.0–10.5)

## 2016-10-12 LAB — BASIC METABOLIC PANEL
Anion gap: 7 (ref 5–15)
BUN: 27 mg/dL — AB (ref 6–20)
CALCIUM: 9.5 mg/dL (ref 8.9–10.3)
CHLORIDE: 106 mmol/L (ref 101–111)
CO2: 24 mmol/L (ref 22–32)
CREATININE: 1.36 mg/dL — AB (ref 0.44–1.00)
GFR calc Af Amer: 48 mL/min — ABNORMAL LOW (ref >60)
GFR calc non Af Amer: 41 mL/min — ABNORMAL LOW (ref >60)
GLUCOSE: 99 mg/dL (ref 65–99)
Potassium: 4.2 mmol/L (ref 3.5–5.1)
Sodium: 137 mmol/L (ref 135–145)

## 2016-10-12 LAB — I-STAT TROPONIN, ED: TROPONIN I, POC: 0.02 ng/mL (ref 0.00–0.08)

## 2016-10-12 LAB — BRAIN NATRIURETIC PEPTIDE: B Natriuretic Peptide: 49.9 pg/mL (ref 0.0–100.0)

## 2016-10-12 MED ORDER — TRAZODONE HCL 50 MG PO TABS
50.0000 mg | ORAL_TABLET | Freq: Every day | ORAL | Status: DC
  Administered 2016-10-13 – 2016-10-14 (×2): 50 mg via ORAL
  Filled 2016-10-12 (×2): qty 1

## 2016-10-12 MED ORDER — IPRATROPIUM-ALBUTEROL 0.5-2.5 (3) MG/3ML IN SOLN
3.0000 mL | Freq: Once | RESPIRATORY_TRACT | Status: AC
  Administered 2016-10-12: 3 mL via RESPIRATORY_TRACT
  Filled 2016-10-12: qty 3

## 2016-10-12 MED ORDER — ASPIRIN 81 MG PO CHEW
81.0000 mg | CHEWABLE_TABLET | ORAL | Status: AC
  Administered 2016-10-13: 81 mg via ORAL
  Filled 2016-10-12: qty 1

## 2016-10-12 MED ORDER — UMECLIDINIUM-VILANTEROL 62.5-25 MCG/INH IN AEPB
1.0000 | INHALATION_SPRAY | Freq: Every day | RESPIRATORY_TRACT | Status: DC
  Administered 2016-10-14: 1 via RESPIRATORY_TRACT
  Filled 2016-10-12: qty 14

## 2016-10-12 MED ORDER — LEVOTHYROXINE SODIUM 25 MCG PO TABS
125.0000 ug | ORAL_TABLET | Freq: Every day | ORAL | Status: DC
  Administered 2016-10-13 – 2016-10-14 (×2): 125 ug via ORAL
  Filled 2016-10-12 (×2): qty 1

## 2016-10-12 MED ORDER — CARVEDILOL 3.125 MG PO TABS
3.1250 mg | ORAL_TABLET | Freq: Two times a day (BID) | ORAL | Status: DC
Start: 2016-10-13 — End: 2016-10-14
  Administered 2016-10-13 – 2016-10-14 (×4): 3.125 mg via ORAL
  Filled 2016-10-12 (×4): qty 1

## 2016-10-12 MED ORDER — ONDANSETRON HCL 4 MG/2ML IJ SOLN: 4.0000 mg | Freq: Four times a day (QID) | INTRAMUSCULAR | Status: DC | PRN

## 2016-10-12 MED ORDER — SODIUM CHLORIDE 0.9 % IV SOLN: 250.0000 mL | INTRAVENOUS | Status: DC | PRN

## 2016-10-12 MED ORDER — ASPIRIN EC 81 MG PO TBEC
81.0000 mg | DELAYED_RELEASE_TABLET | Freq: Every day | ORAL | Status: DC
  Administered 2016-10-13 – 2016-10-14 (×2): 81 mg via ORAL
  Filled 2016-10-12 (×2): qty 1

## 2016-10-12 MED ORDER — NITROGLYCERIN 0.4 MG SL SUBL: 0.4000 mg | SUBLINGUAL_TABLET | SUBLINGUAL | Status: DC | PRN

## 2016-10-12 MED ORDER — HEPARIN SODIUM (PORCINE) 5000 UNIT/ML IJ SOLN
5000.0000 [IU] | Freq: Three times a day (TID) | INTRAMUSCULAR | Status: DC
  Administered 2016-10-13: 5000 [IU] via SUBCUTANEOUS
  Filled 2016-10-12: qty 1

## 2016-10-12 MED ORDER — ATORVASTATIN CALCIUM 20 MG PO TABS
20.0000 mg | ORAL_TABLET | Freq: Every day | ORAL | Status: DC
  Administered 2016-10-13 – 2016-10-14 (×2): 20 mg via ORAL
  Filled 2016-10-12 (×2): qty 1

## 2016-10-12 MED ORDER — CLOPIDOGREL BISULFATE 75 MG PO TABS
75.0000 mg | ORAL_TABLET | Freq: Every day | ORAL | Status: DC
  Administered 2016-10-13 – 2016-10-14 (×2): 75 mg via ORAL
  Filled 2016-10-12 (×2): qty 1

## 2016-10-12 MED ORDER — SODIUM CHLORIDE 0.9 % IV SOLN: INTRAVENOUS | Status: DC

## 2016-10-12 MED ORDER — SODIUM CHLORIDE 0.9% FLUSH
3.0000 mL | Freq: Two times a day (BID) | INTRAVENOUS | Status: DC
  Administered 2016-10-13 (×2): 3 mL via INTRAVENOUS

## 2016-10-12 MED ORDER — INSULIN ASPART 100 UNIT/ML ~~LOC~~ SOLN
0.0000 [IU] | Freq: Three times a day (TID) | SUBCUTANEOUS | Status: DC
  Administered 2016-10-13 – 2016-10-14 (×2): 2 [IU] via SUBCUTANEOUS
  Administered 2016-10-14: 1 [IU] via SUBCUTANEOUS

## 2016-10-12 MED ORDER — ACETAMINOPHEN 325 MG PO TABS
650.0000 mg | ORAL_TABLET | ORAL | Status: DC | PRN
  Administered 2016-10-13 – 2016-10-14 (×2): 650 mg via ORAL
  Filled 2016-10-12 (×2): qty 2

## 2016-10-12 MED ORDER — ASPIRIN 81 MG PO CHEW
324.0000 mg | CHEWABLE_TABLET | Freq: Once | ORAL | Status: AC
  Administered 2016-10-12: 324 mg via ORAL
  Filled 2016-10-12: qty 4

## 2016-10-12 MED ORDER — SODIUM CHLORIDE 0.9% FLUSH: 3.0000 mL | INTRAVENOUS | Status: DC | PRN

## 2016-10-12 NOTE — ED Triage Notes (Signed)
Pt reports having cardiac hx, having chest heaviness and sob with exertion. Hx of chf but no swelling to extremities. Reports dizziness and fatigue. Airway intact, ekg done at triage.

## 2016-10-12 NOTE — ED Provider Notes (Signed)
[facility] DEPT Provider Note   CSN: [id number] Arrival date & time: 10/12/16  1442     History   Chief Complaint Chief Complaint  Patient presents with  . Shortness of Breath  . Chest Pain    HPI Crystal Yates is a 61 y.o. female with h/o known CAD s/p RCA stent, systolic heart failure EF 35-40% (cath Jan 2018), angina pectoris with exertional chronic shortness of breath and chest heaviness, tobacco abuse, hypothyroidism, insulin dependent T2DM, obesity, COPD presents to ED for worsening chest heaviness, dizziness, SOB on exertion x 1.5 weeks associated with nausea. SOB and chest heaviness with walking shorter distances and getting dressed (distance < 5 ft). SOB usually improves with rest, however chest heaviness only improves with nitroglycerin.  Symptoms are worse with laying flat on her back, over the last months she states she's been sleeping on her recliner because she can't sleep in bed. She usually has to sit completely straight up to be able to sleep otherwise she will wake up with coughing fits. Was seen by cardiologist (Dr. Agustin Cree) on 10/05/16 for worsening SOB and chest heaviness who ordered a proBNP was 833, started her on lasix 20 mg daily. Patient states she had a stress test recently and was told her EF was 40%.   HPI  Past Medical History:  Diagnosis Date  . CHF (congestive heart failure) ([place])   . Diabetes mellitus without complication ([place])   . Hyperlipidemia   . Hypertension   . Thyroid disease     Patient Active Problem List   Diagnosis Date Noted  . Dyspnea on exertion 09/29/2016  . History of coronary artery disease 09/29/2016  . OAB (overactive bladder) 09/21/2016  . Flank pain 08/31/2016  . Oxygen dependent 06/20/2016  . Neuralgia of right upper extremity 04/26/2016  . Coronary artery disease 04/16/2016  . Lymphocele after surgical procedure 04/16/2016  . Ventricular tachycardia (paroxysmal) ([place]) 04/09/2016  . Chronic systolic heart failure  ([place]) 04/08/2016  . Ischemic cardiomyopathy 04/08/2016  . Type 2 diabetes mellitus without complication, with long-term current use of insulin ([place]) 04/08/2016  . Anemia, blood loss 09/16/2015  . GI bleeding 09/16/2015  . Precordial pain 09/14/2015  . Chest pain 05/22/2015  . Hypokalemia 05/22/2015  . Hypothyroid 05/22/2015  . Status post hernia repair 01/29/2015  . Surgical wound breakdown 01/29/2015  . Hernia with strangulation 01/18/2015  . Hyperkalemia 01/18/2015  . Tobacco abuse 01/18/2015  . Recurrent incisional hernia with incarceration 01/15/2015  . Sleep apnea 01/15/2015  . S/P drug eluting coronary stent placement 10/16/2014  . Adrenal insufficiency ([place]) 11/21/2013  . Anemia 11/21/2013  . Angina pectoris ([place]) 11/21/2013  . Atherosclerotic heart disease of native coronary artery without angina pectoris 11/21/2013  . Cardiomyopathy ([place]) 11/21/2013  . Congestive heart failure ([place]) 11/21/2013  . Diabetes mellitus with no complication ([place]) 72/62/0355  . Edema 11/21/2013  . Empty sella syndrome ([place]) 11/21/2013  . Hepatitis 11/21/2013  . Hyperlipemia 11/21/2013  . Hypertension 11/21/2013  . Kidney cyst, acquired 11/21/2013  . Lymph nodes enlarged 11/21/2013  . Fat necrosis 11/21/2013  . Other emphysema ([place]) 11/21/2013  . Renal disorder 11/21/2013  . Shortness of breath 11/21/2013  . Sjogrens syndrome ([place]) 11/21/2013  . Thyroid disorder 11/21/2013  . Displacement of lumbar intervertebral disc 06/14/2013  . Intractable migraine without aura 06/14/2013  . Mild cognitive impairment 06/14/2013  . Polyneuropathy in diabetes ([place]) 06/14/2013  . Chronic adrenal insufficiency ([place]) 04/12/2011  . DDD (degenerative disc disease), lumbosacral 01/11/2008  .  Thoracic or lumbosacral neuritis or radiculitis 01/11/2008    Past Surgical History:  Procedure Laterality Date  . ABDOMINAL SURGERY    . CESAREAN SECTION    . CHOLECYSTECTOMY    . COLON RESECTION    . HERNIA MESH  REMOVAL    . HERNIA REPAIR    . SHOULDER ARTHROSCOPY    . TUBAL LIGATION      OB History    No data available       Home Medications    Prior to Admission medications   Medication Sig Start Date End Date Taking? Authorizing Provider  aspirin EC 81 MG tablet Take 81 mg by mouth. 04/13/16 06/07/17  [provider]  atorvastatin (LIPITOR) 20 MG tablet Take 20 mg by mouth daily. 12/12/14   [provider]  bumetanide (BUMEX) 1 MG tablet Take 1 mg by mouth daily.    [provider]  carvedilol (COREG) 3.125 MG tablet Take 3.125 mg by mouth 2 (two) times daily with a meal.    [provider]  Cholecalciferol (VITAMIN D3) 2000 units capsule Take by mouth daily.    [provider]  clopidogrel (PLAVIX) 75 MG tablet Take 75 mg by mouth daily.    [provider]  Dulaglutide (TRULICITY) 2.02 RK/2.7CW SOPN Inject 0.75 mg into the skin daily.    [provider]  ENTRESTO 49-51 MG Take 1 tablet by mouth 2 (two) times daily. 09/27/16   [provider]  furosemide (LASIX) 20 MG tablet Take 1 tablet (20 mg total) by mouth daily. 10/06/16 01/04/17  Park Liter, MD  levothyroxine (SYNTHROID) 125 MCG tablet Take 1 tablet by mouth daily. 10/29/15   [provider]  lidocaine (XYLOCAINE) 5 % ointment Apply 1 application topically as needed for pain. 06/27/16 06/27/17  [provider]  Multiple Vitamins-Minerals (EMERGEN-C VITAMIN C) PACK Take 500 mg by mouth.    [provider]  nitroGLYCERIN (NITROSTAT) 0.4 MG SL tablet Place 0.4 mg under the tongue as needed for chest pain.    [provider]  ondansetron (ZOFRAN-ODT) 4 MG disintegrating tablet Take 4 mg by mouth as needed for nausea. 09/30/15   [provider]  pramipexole (MIRAPEX) 1 MG tablet Take 1 mg by mouth 2 (two) times daily.    [provider]  topiramate (TOPAMAX) 25 MG tablet Take 150 mg by mouth daily.    [provider]  traZODone (DESYREL) 50 MG tablet Take 50 mg by mouth daily. 01/11/16   [provider]  umeclidinium-vilanterol (ANORO ELLIPTA) 62.5-25 MCG/INH AEPB Inhale 1 puff into the lungs daily.    [provider]    Family History Family History  Problem Relation Age of Onset  . Stroke Mother   . Diabetes Father   . Diabetes Sister   . Diabetes Sister   . Diabetes Son     Social History Social History  Substance Use Topics  . Smoking status: Current Every Day Smoker    Types: Cigarettes  . Smokeless tobacco: Never Used  . Alcohol use Yes     Allergies   Hydroxychloroquine; Donepezil; Prednisone; Bupropion; Contrast media [iodinated diagnostic agents]; Metrizamide; and Varenicline   Review of Systems Review of Systems  Constitutional: Positive for fatigue. Negative for chills, diaphoresis and fever.  HENT: Negative for congestion and sore throat.   Eyes: Negative for visual disturbance.  Respiratory: Positive for cough (chronic, unchanged) and shortness of breath.   Cardiovascular: Positive for chest pain. Negative  for leg swelling.  Gastrointestinal: Positive for nausea. Negative for abdominal distention, abdominal pain, constipation, diarrhea and vomiting.  Genitourinary: Negative for difficulty urinating, dysuria, frequency and hematuria.  Skin: Negative for rash.  Allergic/Immunologic: Positive for immunocompromised state (DM).  Neurological: Negative for headaches.     Physical Exam Updated Vital Signs BP (!) 108/58   Pulse 85   Temp 97.7 F (36.5 C) (Oral)   Resp 18   SpO2 100%   Physical Exam  Constitutional: She is oriented to person, place, and time. She appears well-developed and well-nourished. No distress.  HENT:  Head: Normocephalic and atraumatic.  Nose: Nose normal.  Mouth/Throat: No oropharyngeal exudate.  Moist mucous membranes  Eyes: Pupils are equal, round, and reactive to light. Conjunctivae and EOM are normal.    Neck: Normal range of motion. Neck supple.  Cardiovascular: Normal rate, regular rhythm, normal heart sounds and intact distal pulses.   No murmur heard. No JVD No S3 No lower extremity edema  Pulmonary/Chest: Effort normal and breath sounds normal. She exhibits no tenderness.  Orthopnea  Abdominal: Soft. Bowel sounds are normal. She exhibits no distension. There is no tenderness.  Genitourinary: Vaginal discharge:    Musculoskeletal: Normal range of motion. She exhibits no deformity.  Lymphadenopathy:    She has no cervical adenopathy.  Neurological: She is alert and oriented to person, place, and time. No sensory deficit.  Skin: Skin is warm and dry. Capillary refill takes less than 2 seconds.  Psychiatric: She has a normal mood and affect. Her behavior is normal. Judgment and thought content normal.  Nursing note and vitals reviewed.    ED Treatments / Results  Labs (all labs ordered are listed, but only abnormal results are displayed) Labs Reviewed  BASIC METABOLIC PANEL - Abnormal; Notable for the following:       Result Value   BUN 27 (*)    Creatinine, Ser 1.36 (*)    GFR calc non Af Amer 41 (*)    GFR calc Af Amer 48 (*)    All other components within normal limits  CBC  BRAIN NATRIURETIC PEPTIDE  I-STAT TROPOININ, ED    EKG  EKG Interpretation  Date/Time:  Wednesday October 12 2016 18:43:01 EDT Ventricular Rate:  87 PR Interval:    QRS Duration: 97 QT Interval:  413 QTC Calculation: 497 R Axis:   65 Text Interpretation:  Sinus rhythm Ventricular premature complex Borderline T abnormalities, lateral leads Borderline prolonged QT interval No old tracing to compare Confirmed by Sherwood Gambler [id number]) on 10/12/2016 6:45:12 PM       Radiology Dg Chest 2 View  Result Date: 10/12/2016 CLINICAL DATA:  Nausea, dizziness and shortness of breath, 1 week duration. EXAM: CHEST  2 VIEW COMPARISON:  None FINDINGS: Heart size is normal. There is aortic atherosclerosis.  The lungs are clear. The vascularity is normal. No effusions. No significant bone finding. IMPRESSION: No active disease. Electronically Signed   By: Nelson Chimes M.D.   On: 10/12/2016 15:58    Procedures Procedures (including critical care time)  Medications Ordered in ED Medications  nitroGLYCERIN (NITROSTAT) SL tablet 0.4 mg (not administered)  ipratropium-albuterol (DUONEB) 0.5-2.5 (3) MG/3ML nebulizer solution 3 mL (3 mLs Nebulization Given 10/12/16 1856)  aspirin chewable tablet 324 mg (324 mg Oral Given 10/12/16 1856)     Initial Impression / Assessment and Plan / ED Course  I have reviewed the triage vital signs and the nursing notes.  Pertinent labs & imaging results that were  available during my care of the patient were reviewed by me and considered in my medical decision making (see chart for details).    Last documented nuclear stress test January 2018 showed ischemia, now s/p RCA stent. Cardiac monitor with some PVCs July 2018. Pt reports recent stress test with EF 40%, unable to find this documentation.   61 year old female presents to the ED for evaluation of worsening shortness of breath, chest heaviness, dizziness, nausea on exertion. Exacerbated by exertional activity, laying flat on her back and alleviated with rest and nitroglycerin. On exam she is hemodynamically stable, no obvious signs of fluid overload. Very faint expiratory wheezing to bilateral lower lobes. Concerns for ACS versus acute CHF exacerbation, less likely infectious pulmonary etiology. We'll get screening labs, EKG, chest x-ray.  EKG, chest x-ray, CBC and troponin are overall reassuring. Slightly elevated creatinine now 1.36 otherwise BMP is reassuring. BNP is still pending. We'll consult the cardiologist. Given the HEART score = at least 4 and recent worsening exertional symptoms I suspect patient will likely require admission. Patient was given DuoNeb and nitroglycerin in the ED.    Final Clinical  Impressions(s) / ED Diagnoses   Final diagnoses:  Shortness of breath   Spoke to cardiology who will evaluate patient in the ED and admit. Patient was discussed with supervising physician who also evaluated patient in the ED and is agreeable with ED treatment and disposition.  New Prescriptions New Prescriptions   No medications on file     Arlean Hopping 10/12/16 1916    Sherwood Gambler, MD 10/13/16 [phone]

## 2016-10-12 NOTE — ED Notes (Signed)
Spoke with Jeneen Rinks with [facility] to review pt chart for eligibility for tele placement.

## 2016-10-12 NOTE — ED Notes (Signed)
Spoke with Dr Brush Lions the on call cardiologist about pt active chest pain. Per provider OK for telemetry.

## 2016-10-13 ENCOUNTER — Encounter (HOSPITAL_COMMUNITY): Payer: Self-pay | Admitting: Internal Medicine

## 2016-10-13 ENCOUNTER — Encounter (HOSPITAL_COMMUNITY)
Admission: EM | Disposition: A | Discharge status: Home / Self Care | Payer: Self-pay | Source: Home / Self Care | Attending: Emergency Medicine

## 2016-10-13 ENCOUNTER — Observation Stay (HOSPITAL_BASED_OUTPATIENT_CLINIC_OR_DEPARTMENT_OTHER): Payer: Federal, State, Local not specified - PPO

## 2016-10-13 DIAGNOSIS — I2510 Atherosclerotic heart disease of native coronary artery without angina pectoris: Secondary | ICD-10-CM | POA: Diagnosis not present

## 2016-10-13 DIAGNOSIS — R0602 Shortness of breath: Secondary | ICD-10-CM | POA: Diagnosis not present

## 2016-10-13 DIAGNOSIS — I255 Ischemic cardiomyopathy: Secondary | ICD-10-CM

## 2016-10-13 DIAGNOSIS — R0789 Other chest pain: Secondary | ICD-10-CM

## 2016-10-13 DIAGNOSIS — I25110 Atherosclerotic heart disease of native coronary artery with unstable angina pectoris: Secondary | ICD-10-CM | POA: Diagnosis not present

## 2016-10-13 DIAGNOSIS — I5042 Chronic combined systolic (congestive) and diastolic (congestive) heart failure: Secondary | ICD-10-CM

## 2016-10-13 DIAGNOSIS — I5043 Acute on chronic combined systolic (congestive) and diastolic (congestive) heart failure: Secondary | ICD-10-CM | POA: Diagnosis present

## 2016-10-13 HISTORY — PX: LEFT HEART CATH AND CORONARY ANGIOGRAPHY: CATH118249

## 2016-10-13 LAB — CBC
HCT: 39.9 % (ref 36.0–46.0)
Hemoglobin: 12.9 g/dL (ref 12.0–15.0)
MCH: 30.9 pg (ref 26.0–34.0)
MCHC: 32.3 g/dL (ref 30.0–36.0)
MCV: 95.7 fL (ref 78.0–100.0)
PLATELETS: 204 10*3/uL (ref 150–400)
RBC: 4.17 MIL/uL (ref 3.87–5.11)
RDW: 14.9 % (ref 11.5–15.5)
WBC: 4.8 10*3/uL (ref 4.0–10.5)

## 2016-10-13 LAB — BASIC METABOLIC PANEL
Anion gap: 5 (ref 5–15)
BUN: 24 mg/dL — AB (ref 6–20)
CALCIUM: 9.3 mg/dL (ref 8.9–10.3)
CO2: 25 mmol/L (ref 22–32)
CREATININE: 1.31 mg/dL — AB (ref 0.44–1.00)
Chloride: 108 mmol/L (ref 101–111)
GFR calc non Af Amer: 43 mL/min — ABNORMAL LOW (ref >60)
GFR, EST AFRICAN AMERICAN: 50 mL/min — AB (ref >60)
GLUCOSE: 97 mg/dL (ref 65–99)
Potassium: 4 mmol/L (ref 3.5–5.1)
Sodium: 138 mmol/L (ref 135–145)

## 2016-10-13 LAB — GLUCOSE, CAPILLARY
GLUCOSE-CAPILLARY: 66 mg/dL (ref 65–99)
GLUCOSE-CAPILLARY: 123 mg/dL — AB (ref 65–99)
Glucose-Capillary: 114 mg/dL — ABNORMAL HIGH (ref 65–99)
Glucose-Capillary: 153 mg/dL — ABNORMAL HIGH (ref 65–99)

## 2016-10-13 LAB — LIPID PANEL
Cholesterol: 122 mg/dL (ref 0–200)
HDL: 34 mg/dL — ABNORMAL LOW (ref >40)
LDL CALC: 74 mg/dL (ref 0–99)
TRIGLYCERIDES: 68 mg/dL (ref <150)
Total CHOL/HDL Ratio: 3.6 RATIO
VLDL: 14 mg/dL (ref 0–40)

## 2016-10-13 LAB — ECHOCARDIOGRAM COMPLETE
Height: 62 in
Weight: 3068.8 oz

## 2016-10-13 LAB — PROTIME-INR
INR: 1.02
PROTHROMBIN TIME: 13.4 s (ref 11.4–15.2)

## 2016-10-13 LAB — CREATININE, SERUM
CREATININE: 1.13 mg/dL — AB (ref 0.44–1.00)
GFR calc non Af Amer: 51 mL/min — ABNORMAL LOW (ref >60)
GFR, EST AFRICAN AMERICAN: 60 mL/min — AB (ref >60)

## 2016-10-13 LAB — TROPONIN I
Troponin I: <0.03 ng/mL (ref <0.03)
Troponin I: <0.03 ng/mL (ref <0.03)

## 2016-10-13 LAB — D-DIMER, QUANTITATIVE: D-Dimer, Quant: 0.64 ug/mL-FEU — ABNORMAL HIGH (ref 0.00–0.50)

## 2016-10-13 LAB — APTT: aPTT: 27 seconds (ref 24–36)

## 2016-10-13 SURGERY — LEFT HEART CATH AND CORONARY ANGIOGRAPHY
Anesthesia: LOCAL

## 2016-10-13 MED ORDER — HEPARIN (PORCINE) IN NACL 2-0.9 UNIT/ML-% IJ SOLN
INTRAMUSCULAR | Status: AC
  Filled 2016-10-13: qty 1000

## 2016-10-13 MED ORDER — TRAMADOL HCL 50 MG PO TABS
50.0000 mg | ORAL_TABLET | Freq: Every day | ORAL | Status: DC | PRN
  Administered 2016-10-13: 50 mg via ORAL
  Filled 2016-10-13: qty 1

## 2016-10-13 MED ORDER — VERAPAMIL HCL 2.5 MG/ML IV SOLN
INTRAVENOUS | Status: AC
  Filled 2016-10-13: qty 2

## 2016-10-13 MED ORDER — VERAPAMIL HCL 2.5 MG/ML IV SOLN
INTRAVENOUS | Status: DC | PRN
  Administered 2016-10-13: 10 mL via INTRA_ARTERIAL

## 2016-10-13 MED ORDER — MIDAZOLAM HCL 2 MG/2ML IJ SOLN
INTRAMUSCULAR | Status: DC | PRN
  Administered 2016-10-13 (×2): 1 mg via INTRAVENOUS

## 2016-10-13 MED ORDER — IOPAMIDOL (ISOVUE-370) INJECTION 76%
INTRAVENOUS | Status: DC | PRN
  Administered 2016-10-13: 35 mL via INTRA_ARTERIAL

## 2016-10-13 MED ORDER — HYDROCORTISONE 10 MG PO TABS
10.0000 mg | ORAL_TABLET | Freq: Every day | ORAL | Status: DC
  Filled 2016-10-13: qty 1

## 2016-10-13 MED ORDER — HEPARIN (PORCINE) IN NACL 2-0.9 UNIT/ML-% IJ SOLN
INTRAMUSCULAR | Status: AC | PRN
  Administered 2016-10-13: 1000 mL

## 2016-10-13 MED ORDER — HYDROCORTISONE 5 MG PO TABS
5.0000 mg | ORAL_TABLET | Freq: Every day | ORAL | Status: DC
  Administered 2016-10-13 – 2016-10-14 (×2): 5 mg via ORAL
  Filled 2016-10-13 (×2): qty 1

## 2016-10-13 MED ORDER — SODIUM CHLORIDE 0.9 % IV SOLN: INTRAVENOUS | Status: AC

## 2016-10-13 MED ORDER — SODIUM CHLORIDE 0.9% FLUSH
3.0000 mL | Freq: Two times a day (BID) | INTRAVENOUS | Status: DC
  Administered 2016-10-13 – 2016-10-14 (×3): 3 mL via INTRAVENOUS

## 2016-10-13 MED ORDER — SODIUM CHLORIDE 0.9% FLUSH: 3.0000 mL | INTRAVENOUS | Status: DC | PRN

## 2016-10-13 MED ORDER — HYDROCORTISONE 10 MG PO TABS
10.0000 mg | ORAL_TABLET | Freq: Every day | ORAL | Status: DC
  Administered 2016-10-14: 10 mg via ORAL
  Filled 2016-10-13 (×2): qty 1

## 2016-10-13 MED ORDER — IOPAMIDOL (ISOVUE-370) INJECTION 76%
INTRAVENOUS | Status: AC
Start: 2016-10-13 — End: ?
  Filled 2016-10-13: qty 100

## 2016-10-13 MED ORDER — TOPIRAMATE 100 MG PO TABS
300.0000 mg | ORAL_TABLET | Freq: Every day | ORAL | Status: DC
  Administered 2016-10-13: 300 mg via ORAL
  Filled 2016-10-13: qty 3

## 2016-10-13 MED ORDER — SODIUM CHLORIDE 0.9 % IV SOLN: 250.0000 mL | INTRAVENOUS | Status: DC | PRN

## 2016-10-13 MED ORDER — LIDOCAINE HCL (PF) 1 % IJ SOLN
INTRAMUSCULAR | Status: AC
  Filled 2016-10-13: qty 30

## 2016-10-13 MED ORDER — PRAMIPEXOLE DIHYDROCHLORIDE 1 MG PO TABS
1.0000 mg | ORAL_TABLET | Freq: Two times a day (BID) | ORAL | Status: DC
  Administered 2016-10-13 – 2016-10-14 (×2): 1 mg via ORAL
  Filled 2016-10-13 (×2): qty 1

## 2016-10-13 MED ORDER — HEPARIN SODIUM (PORCINE) 5000 UNIT/ML IJ SOLN
5000.0000 [IU] | Freq: Three times a day (TID) | INTRAMUSCULAR | Status: DC
  Administered 2016-10-14 (×2): 5000 [IU] via SUBCUTANEOUS
  Filled 2016-10-13 (×2): qty 1

## 2016-10-13 MED ORDER — LIDOCAINE HCL (PF) 1 % IJ SOLN
INTRAMUSCULAR | Status: DC | PRN
  Administered 2016-10-13: 2 mL
  Administered 2016-10-13: 22 mL

## 2016-10-13 MED ORDER — FENTANYL CITRATE (PF) 100 MCG/2ML IJ SOLN
INTRAMUSCULAR | Status: AC
  Filled 2016-10-13: qty 2

## 2016-10-13 MED ORDER — SODIUM CHLORIDE 0.9 % IV SOLN
INTRAVENOUS | Status: AC | PRN
  Administered 2016-10-13: 87 mL via INTRAVENOUS

## 2016-10-13 MED ORDER — HYDROCORTISONE 5 MG PO TABS
5.0000 mg | ORAL_TABLET | Freq: Every day | ORAL | Status: DC
  Filled 2016-10-13: qty 1

## 2016-10-13 MED ORDER — MIDAZOLAM HCL 2 MG/2ML IJ SOLN
INTRAMUSCULAR | Status: AC
  Filled 2016-10-13: qty 2

## 2016-10-13 MED ORDER — FENTANYL CITRATE (PF) 100 MCG/2ML IJ SOLN
INTRAMUSCULAR | Status: DC | PRN
  Administered 2016-10-13 (×2): 25 ug via INTRAVENOUS

## 2016-10-13 SURGICAL SUPPLY — 14 items
CATH INFINITI 5FR MULTPACK ANG (CATHETERS) ×2 IMPLANT
COVER PRB 48X5XTLSCP FOLD TPE (BAG) ×1 IMPLANT
COVER PROBE 5X48 (BAG) ×1
DEVICE RAD COMP TR BAND LRG (VASCULAR PRODUCTS) ×2 IMPLANT
GLIDESHEATH SLEND SS 6F .021 (SHEATH) ×2 IMPLANT
GUIDEWIRE INQWIRE 1.5J.035X260 (WIRE) ×1 IMPLANT
INQWIRE 1.5J .035X260CM (WIRE) ×2
KIT HEART LEFT (KITS) ×2 IMPLANT
KIT MICROINTRODUCER STIFF 5F (SHEATH) ×2 IMPLANT
PACK CARDIAC CATHETERIZATION (CUSTOM PROCEDURE TRAY) ×2 IMPLANT
SHEATH PINNACLE 5F 10CM (SHEATH) ×2 IMPLANT
TRANSDUCER W/STOPCOCK (MISCELLANEOUS) ×2 IMPLANT
TUBING CIL FLEX 10 FLL-RA (TUBING) ×2 IMPLANT
WIRE EMERALD 3MM-J .035X150CM (WIRE) ×2 IMPLANT

## 2016-10-13 NOTE — Progress Notes (Addendum)
Site area: RFA Site Prior to Removal:  Level 0 Pressure Applied For:30 min Manual:   yes Patient Status During Pull:  stable Post Pull Site:  Level 0 Post Pull Instructions Given: yes  Post Pull Pulses Present: doppler Dressing Applied:  tegaderm Bedrest begins @ 1110 till 1510 Comments:

## 2016-10-13 NOTE — Brief Op Note (Signed)
Brief Cardiac Catheterization Note  Date: 10/13/2016 Time: 10:20 AM  PATIENT:  Crystal Yates  61 y.o. female  PRE-OPERATIVE DIAGNOSIS:  unstable angina  POST-OPERATIVE DIAGNOSIS:  Non-obstructive CAD, cardiomyopathy  PROCEDURE:  Procedure(s): Left Heart Cath and Coronary Angiography (N/A)  SURGEON:  Surgeon(s) and Role:    * Averill Winters, Harrell Gave, MD - Primary  FINDINGS: 1. Non-obstructive CAD. 2. Patent stents in LAD and RCA with mild to moderate ISR. 3. Upper normal left ventricular filling pressure. 4. Moderate to severely reduced LVEF with global hypokinesis most pronounced along the inferior wall and apex.  RECOMMENDATIONS: 1. Secondary prevention of CAD. 2. Medical management of chronic systolic heart failure; LVEDP not significantly elevated to explain patient's symptoms. 3. Obtain TTE to better assess LV function. 4. Consider evaluation for underlying pulmonary condition contributing to patient's symptoms.  Nelva Bush, MD [facility] Pager: [phone]

## 2016-10-13 NOTE — Interval H&P Note (Signed)
History and Physical Interval Note:  10/13/2016 9:07 AM  Crystal Yates  has presented today for cardiac catheterization, with the diagnosis of unstable angina. The various methods of treatment have been discussed with the patient and family. After consideration of risks, benefits and other options for treatment, the patient has consented to  Procedure(s): Left Heart Cath and Coronary Angiography (N/A) as a surgical intervention .  The patient's history has been reviewed, patient examined, no change in status, stable for surgery.  I have reviewed the patient's chart and labs.  Questions were answered to the patient's satisfaction.    Cath Lab Visit (complete for each Cath Lab visit)  Clinical Evaluation Leading to the Procedure:   ACS: Yes.    Non-ACS:  N/A  Crystal Yates

## 2016-10-13 NOTE — Consult Note (Signed)
Name: Crystal Yates MRN: [id number] DOB: December 15, 1955    ADMISSION DATE:  10/12/2016 CONSULTATION DATE:  7/19  REFERRING MD :  Hilty (Cards)   CHIEF COMPLAINT:  COPD   BRIEF PATIENT DESCRIPTION:  61yo female smoker with hx CAD with previous PCI, DM, COPD, HTN, CHF with EF 35% recently treated as outpt for PNA 8 weeks ago with residual cough since.  She presented 7/18 with cough, progressive SOB and chest tightness which was relieved with nitro.  There was concern for lateral t-wave changes and she was admitted by cardiology with unstable angina.  She underwent cardiac cath 7/19 which revealed non obstructive CAD and patent stents.  She has ongoing dyspnea and PCCM consulted to assist with COPD.   SIGNIFICANT EVENTS    STUDIES:  CXR 7/18>> neg acute  Cardiac cath 7/19>>>FINDINGS: 1. Non-obstructive CAD. 2. Patent stents in LAD and RCA with mild to moderate ISR. 3. Upper normal left ventricular filling pressure. 4. Moderate to severely reduced LVEF with global hypokinesis most pronounced along the inferior wall and apex.  RECOMMENDATIONS: 1. Secondary prevention of CAD. 2. Medical management of chronic systolic heart failure; LVEDP not significantly elevated to explain patient's symptoms. 3. Obtain TTE to better assess LV function. 4. Consider evaluation for underlying pulmonary condition contributing to patient's symptoms.    HISTORY OF PRESENT ILLNESS:   61yo female smoker with hx CAD with previous PCI, DM, COPD, HTN, CHF with EF 35% recently treated as outpt for PNA 8 weeks ago with residual cough since.  She presented 7/18 with cough, progressive SOB and chest tightness which was relieved with nitro.  There was concern for lateral t-wave changes and she was admitted by cardiology with unstable angina.  She underwent cardiac cath 7/19 which revealed non obstructive CAD and patent stents.  She has ongoing dyspnea and PCCM consulted to assist with COPD.   Active smoker - 43 pack  years. Does not want to quit.  Still c/o SOB which has been above baseline x 2-3 weeks and cough which feels residual since her PNA treatment in May.   Denies chest pain currently.  Does c/o some intermittent dizziness over last few weeks.  Denies recent travel, leg/calf pain or swelling.  No hx VTE.  See Dr. Carren Rang in [place] for pulmonary - had recent PFT's told she had mild COPD.     PAST MEDICAL HISTORY :   has a past medical history of CHF (congestive heart failure) ([place]); Diabetes mellitus without complication ([place]); Hyperlipidemia; Hypertension; and Thyroid disease.  has a past surgical history that includes Cesarean section; Tubal ligation; Cholecystectomy; Hernia repair; Shoulder arthroscopy; Colon resection; Hernia mesh removal; Abdominal surgery; and Left Heart Cath and Coronary Angiography (N/A, 10/13/2016). Prior to Admission medications   Medication Sig Start Date End Date Taking? Authorizing Provider  aspirin EC 81 MG tablet Take 81 mg by mouth daily.  04/13/16 06/07/17 Yes [provider]  atorvastatin (LIPITOR) 20 MG tablet Take 20 mg by mouth daily. 12/12/14  Yes [provider]  bumetanide (BUMEX) 1 MG tablet Take 1 mg by mouth daily.   Yes [provider]  carvedilol (COREG) 3.125 MG tablet Take 3.125 mg by mouth 2 (two) times daily with a meal.   Yes [provider]  Cholecalciferol (VITAMIN D3) 2000 units capsule Take 2,000 Units by mouth daily.    Yes [provider]  clopidogrel (PLAVIX) 75 MG tablet Take 75 mg by mouth daily.   Yes [provider]  ENTRESTO 49-51 MG Take 1 tablet by mouth 2 (two) times daily. 09/27/16  Yes [provider]  furosemide (LASIX) 20 MG tablet Take 1 tablet (20 mg total) by mouth daily. 10/06/16 01/04/17 Yes Park Liter, MD  hydrocortisone (CORTEF) 5 MG tablet Take 5-10 mg by mouth See admin instructions. 10 mg in the morning then 5 mg midday   Yes [provider]   ibuprofen (ADVIL,MOTRIN) 200 MG tablet Take 800 mg by mouth every 6 (six) hours as needed (for pain).   Yes [provider]  insulin degludec (TRESIBA FLEXTOUCH) 100 UNIT/ML SOPN FlexTouch Pen Inject 20 Units into the skin at bedtime. 08/28/16  Yes [provider]  Iron-FA-B Cmp-C-Biot-Probiotic (FUSION PLUS) CAPS Take 1 capsule by mouth daily. 10/09/16  Yes [provider]  isosorbide mononitrate (IMDUR) 60 MG 24 hr tablet Take 60 mg by mouth daily.   Yes [provider]  levothyroxine (SYNTHROID) 125 MCG tablet Take 125 mcg by mouth daily.  10/29/15  Yes [provider]  lidocaine (XYLOCAINE) 5 % ointment Apply 1 application topically as needed (for neck pain).  06/27/16 06/27/17 Yes [provider]  Multiple Vitamins-Minerals (EMERGEN-C IMMUNE PO) Take 1 capsule by mouth daily. CHEW   Yes [provider]  nitroGLYCERIN (NITROSTAT) 0.4 MG SL tablet Place 0.4 mg under the tongue every 5 (five) minutes as needed for chest pain.    Yes [provider]  pramipexole (MIRAPEX) 1 MG tablet Take 1 mg by mouth 2 (two) times daily.   Yes [provider]  topiramate (TOPAMAX) 100 MG tablet Take 300 mg by mouth at bedtime.   Yes [provider]  traMADol (ULTRAM) 50 MG tablet Take 50 mg by mouth at bedtime as needed (for pain).   Yes [provider]  traZODone (DESYREL) 50 MG tablet Take 50 mg by mouth at bedtime.  01/11/16  Yes [provider]  TRULICITY 1.5 ZO/1.0RU SOPN Inject 1.5 mg into the skin every 7 (seven) days. 10/10/16  Yes [provider]  umeclidinium-vilanterol (ANORO ELLIPTA) 62.5-25 MCG/INH AEPB Inhale 1 puff into the lungs daily.   Yes [provider]   Allergies  Allergen Reactions  . Hydroxychloroquine Shortness Of Breath, Nausea Only and Other (See Comments)    Dizziness (also)  . Donepezil Other (See Comments)    Dizziness, depression, and makes the patient feel  "funny"  . Prednisone Other (See Comments)    Causes depression and suicidal thoughts  . Bupropion Other (See Comments)    Suicidal thoughts  . Contrast Media [Iodinated Diagnostic Agents]     "Blows the vein" and contrast gathers at the injected site's limb  . Metrizamide Other (See Comments)    (a non-ionic radiopaque contrast agent) "Blows the vein" and contrast gathers at the injected site's limb  . Varenicline Other (See Comments)    Suicidal thoughts  . Tape Rash    Paper tape is preferred, PLEASE    FAMILY HISTORY:  family history includes Diabetes in her father, sister, sister, and son; Stroke in her mother. SOCIAL HISTORY:  reports that she has been smoking Cigarettes.  She has never used smokeless tobacco. She reports that she drinks alcohol. She reports that she does not use drugs.  REVIEW OF SYSTEMS:   As per HPI - All other systems reviewed and were neg.   SUBJECTIVE:   VITAL SIGNS: Temp:  [97.7 F (36.5 C)-97.9 F (36.6 C)] 97.7 F (36.5 C) (07/19 0400) Pulse Rate:  [  70-97] 80 (07/19 1035) Resp:  [11-23] 12 (07/19 1110) BP: (81-121)/(39-67) 101/50 (07/19 1110) SpO2:  [93 %-100 %] 98 % (07/19 1045) Weight:  [87 kg (191 lb 12.8 oz)-87.9 kg (193 lb 11.2 oz)] 87 kg (191 lb 12.8 oz) (07/19 0400)  PHYSICAL EXAMINATION: General:  Obese female, NAD  Neuro:  Awake, alert, appropriate, MAE  HEENT:  Mm moist, no JVD  Cardiovascular:  s1s2 rrr Lungs:  resps even non labored on 2L, few scattered rhonchi, no audible wheeze  Abdomen:  Round, soft, +bs  Musculoskeletal:  Warm and dry, no edema   Recent Labs Lab 10/12/16 1430 10/13/16 0455 10/13/16 1204  NA 137 138  --   K 4.2 4.0  --   CL 106 108  --   CO2 24 25  --   BUN 27* 24*  --   CREATININE 1.36* 1.31* 1.13*  GLUCOSE 99 97  --     Recent Labs Lab 10/12/16 1430 10/13/16 1204  HGB 14.5 12.9  HCT 43.4 39.9  WBC 6.4 4.8  PLT 231 204   Dg Chest 2 View  Result Date: 10/12/2016 CLINICAL DATA:   Nausea, dizziness and shortness of breath, 1 week duration. EXAM: CHEST  2 VIEW COMPARISON:  None FINDINGS: Heart size is normal. There is aortic atherosclerosis. The lungs are clear. The vascularity is normal. No effusions. No significant bone finding. IMPRESSION: No active disease. Electronically Signed   By: Nelson Chimes M.D.   On: 10/12/2016 15:58    ASSESSMENT / PLAN:  Dyspnea - likely multifactorial in setting COPD, ongoing tobacco abuse, hx CAD with unstable angina.  However no significant change on cath to explain acute change in dyspnea.   COPD - mild according to pt with recent PFT's with pulmonary in [place].   REC -  Smoking cessation - discussed at length  duonebs q6 for now  No need for systemic steroids  Resume anoro on d/c  Pulmonary hygiene  Consider CT chest to r/o PE given chest pain, dyspnea, dizziness and relatively clear cath - will hold for now as has listed contrast "allergy" and is post cath   outpt pulmonary f/u   Nickolas Madrid, NP 10/13/2016  3:46 PM Pager: [phone] or [phone]  Attending Note:  I have examined patient, reviewed labs, studies and notes. I have discussed the case with Shon Millet, and I agree with the data and plans as amended above.   61 yo active smoker (40+ pk-yrs) with CAD, prior PTCI, HTN, CHF presumed ischemic w EF 35%. Has a hx recurrent CP and dyspnea per review of past notes. Presented with similar sx, ECG changes and taken for L heart cath > non-obstructive CAD, patent stents. Per her report she had spirometry in [place], I do not have copy, she was told she had mild obstruction.   On my eval she is comfortable, currently on RA. She has no wheeze on exam. No edema. No pain on palp chest wall or costal margin.   She has had similar episodes of pain before, CT -PA in the past that has been negative. All the same, in absence bronchospasm, ACS, I believe we have to consider PE. Would start by checking d-dimer. If negative  then would stop there. If positive then would need to consider either Ct-PA or V/q scan depending on her renal fxn after contrast dye for cath. She would require pre-meds for this due to allergy.   Would also perform walking oximetry to insure no  evidence desaturation.   Will follow with you.    Baltazar Apo, MD, PhD 10/13/2016, 4:34 PM [facility] [phone] or if no answer [phone]

## 2016-10-13 NOTE — H&P (Signed)
ADMISSION HISTORY & PHYSICAL  Patient Name: Crystal Yates Date of Encounter: 10/13/2016 Primary Care Physician: Dortha Kern, PA Cardiologist: Dr. Malachi Bonds Problem List   Principal Problem:   Unstable angina [facility]) Active Problems:   Adrenal insufficiency ([place])   Ischemic cardiomyopathy   S/P drug eluting coronary stent placement   Type 2 diabetes mellitus without complication, with long-term current use of insulin (HCC)   Chest tightness or pressure   Acute on chronic combined systolic and diastolic CHF (congestive heart failure) [facility])    Chief Complaint    Chest tightness  HPI  This is a 61 y.o. female patient of Dr. Agustin Cree with a past medical history significant for CAD (prior stent in 03/2016 to the RCA), smoking, COPD, HTN, HPL and thyroid disease. She saw Dr. Raliegh Ip last week for preoperative workup for neck surgery and was ordered to have a stress test. She also has a history of CHF with EF 35%. He ordered a myoview which was performed yesterday and was reportedly "negative" with improved LVEF to 45%, however, those results are not in the computer. She has been having increasing chest tightness. Recently she was treated for pneumonia in May. She has had a cough since then. Little benefit from nebulizers. The tightness is band-like around the chest, not worse with breathing but definitely associated with exertion and relieved by rest.  EKG shows sinus rhythm, PVC's and lateral T wave changes. Nitroglycerin seems to help her pain.  PMHx   Past Medical History:  Diagnosis Date  . CHF (congestive heart failure) ([place])   . Diabetes mellitus without complication ([place])   . Hyperlipidemia   . Hypertension   . Thyroid disease     Past Surgical History:  Procedure Laterality Date  . ABDOMINAL SURGERY    . CESAREAN SECTION    . CHOLECYSTECTOMY    . COLON RESECTION    . HERNIA MESH REMOVAL    . HERNIA REPAIR    . SHOULDER ARTHROSCOPY    . TUBAL LIGATION       FAMHx   Family History  Problem Relation Age of Onset  . Stroke Mother   . Diabetes Father   . Diabetes Sister   . Diabetes Sister   . Diabetes Son     SOCHx    reports that she has been smoking Cigarettes.  She has never used smokeless tobacco. She reports that she drinks alcohol. She reports that she does not use drugs.  Outpatient Medications   No current facility-administered medications on file prior to encounter.    Current Outpatient Prescriptions on File Prior to Encounter  Medication Sig Dispense Refill  . aspirin EC 81 MG tablet Take 81 mg by mouth daily.     Marland Kitchen atorvastatin (LIPITOR) 20 MG tablet Take 20 mg by mouth daily.    . bumetanide (BUMEX) 1 MG tablet Take 1 mg by mouth daily.    . carvedilol (COREG) 3.125 MG tablet Take 3.125 mg by mouth 2 (two) times daily with a meal.    . Cholecalciferol (VITAMIN D3) 2000 units capsule Take 2,000 Units by mouth daily.     . clopidogrel (PLAVIX) 75 MG tablet Take 75 mg by mouth daily.    Marland Kitchen ENTRESTO 49-51 MG Take 1 tablet by mouth 2 (two) times daily.  6  . furosemide (LASIX) 20 MG tablet Take 1 tablet (20 mg total) by mouth daily. 30 tablet 6  . levothyroxine (SYNTHROID) 125 MCG tablet Take 125 mcg  by mouth daily.     Marland Kitchen lidocaine (XYLOCAINE) 5 % ointment Apply 1 application topically as needed (for neck pain).     . nitroGLYCERIN (NITROSTAT) 0.4 MG SL tablet Place 0.4 mg under the tongue every 5 (five) minutes as needed for chest pain.     . pramipexole (MIRAPEX) 1 MG tablet Take 1 mg by mouth 2 (two) times daily.    . traZODone (DESYREL) 50 MG tablet Take 50 mg by mouth at bedtime.     Marland Kitchen umeclidinium-vilanterol (ANORO ELLIPTA) 62.5-25 MCG/INH AEPB Inhale 1 puff into the lungs daily.      Inpatient Medications    Scheduled Meds: . aspirin EC  81 mg Oral Daily  . atorvastatin  20 mg Oral Daily  . carvedilol  3.125 mg Oral BID WC  . clopidogrel  75 mg Oral Daily  . heparin  5,000 Units Subcutaneous Q8H  . insulin  aspart  0-9 Units Subcutaneous TID WC  . levothyroxine  125 mcg Oral QAC breakfast  . sodium chloride flush  3 mL Intravenous Q12H  . traZODone  50 mg Oral Daily  . umeclidinium-vilanterol  1 puff Inhalation Daily    Continuous Infusions: . sodium chloride    . sodium chloride      PRN Meds: sodium chloride, acetaminophen, nitroGLYCERIN, ondansetron (ZOFRAN) IV, sodium chloride flush   ALLERGIES   Allergies  Allergen Reactions  . Hydroxychloroquine Shortness Of Breath, Nausea Only and Other (See Comments)    Dizziness (also)  . Donepezil Other (See Comments)    Dizziness, depression, and makes the patient feel "funny"  . Prednisone Other (See Comments)    Causes depression and suicidal thoughts  . Bupropion Other (See Comments)    Suicidal thoughts  . Contrast Media [Iodinated Diagnostic Agents]     "Blows the vein" and contrast gathers at the injected site's limb  . Metrizamide Other (See Comments)    (a non-ionic radiopaque contrast agent) "Blows the vein" and contrast gathers at the injected site's limb  . Varenicline Other (See Comments)    Suicidal thoughts  . Tape Rash    Paper tape is preferred, PLEASE    ROS   Pertinent items noted in HPI and remainder of comprehensive ROS otherwise negative.  Vitals   Vitals:   10/12/16 2218 10/12/16 2330 10/13/16 0400 10/13/16 0500  BP:  114/60 (!) 116/55 (!) 99/57  Pulse: 88 85    Resp: (!) '22 15 16   ' Temp:  97.9 F (36.6 C) 97.7 F (36.5 C)   TempSrc:  Oral Oral   SpO2: 97% 98% 97% 97%  Weight:  193 lb 11.2 oz (87.9 kg) 191 lb 12.8 oz (87 kg)   Height:  '5\' 2"'  (1.575 m)      Intake/Output Summary (Last 24 hours) at 10/13/16 0740 Last data filed at 10/13/16 0500  Gross per 24 hour  Intake                0 ml  Output              350 ml  Net             -350 ml   Filed Weights   10/12/16 2330 10/13/16 0400  Weight: 193 lb 11.2 oz (87.9 kg) 191 lb 12.8 oz (87 kg)    Physical Exam   General appearance:  alert and no distress Neck: no carotid bruit, no JVD, supple, symmetrical, trachea midline and thyroid not enlarged, symmetric, no  tenderness/mass/nodules Lungs: diminished breath sounds bilaterally and rhonchi bilaterally Heart: regular rate and rhythm Abdomen: soft, non-tender; bowel sounds normal; no masses,  no organomegaly Extremities: extremities normal, atraumatic, no cyanosis or edema Pulses: 2+ and symmetric Skin: Skin color, texture, turgor normal. No rashes or lesions Neurologic: Grossly normal PSych: Pleasanat  Labs   Results for orders placed or performed during the hospital encounter of 10/12/16 (from the past 48 hour(s))  Basic metabolic panel     Status: Abnormal   Collection Time: 10/12/16  2:30 PM  Result Value Ref Range   Sodium 137 135 - 145 mmol/L   Potassium 4.2 3.5 - 5.1 mmol/L   Chloride 106 101 - 111 mmol/L   CO2 24 22 - 32 mmol/L   Glucose, Bld 99 65 - 99 mg/dL   BUN 27 (H) 6 - 20 mg/dL   Creatinine, Ser 1.36 (H) 0.44 - 1.00 mg/dL   Calcium 9.5 8.9 - 10.3 mg/dL   GFR calc non Af Amer 41 (L) >60 mL/min   GFR calc Af Amer 48 (L) >60 mL/min    Comment: (NOTE) The eGFR has been calculated using the CKD EPI equation. This calculation has not been validated in all clinical situations. eGFR's persistently <60 mL/min signify possible Chronic Kidney Disease.    Anion gap 7 5 - 15  CBC     Status: None   Collection Time: 10/12/16  2:30 PM  Result Value Ref Range   WBC 6.4 4.0 - 10.5 K/uL   RBC 4.57 3.87 - 5.11 MIL/uL   Hemoglobin 14.5 12.0 - 15.0 g/dL   HCT 43.4 36.0 - 46.0 %   MCV 95.0 78.0 - 100.0 fL   MCH 31.7 26.0 - 34.0 pg   MCHC 33.4 30.0 - 36.0 g/dL   RDW 15.0 11.5 - 15.5 %   Platelets 231 150 - 400 K/uL  Brain natriuretic peptide     Status: None   Collection Time: 10/12/16  2:30 PM  Result Value Ref Range   B Natriuretic Peptide 49.9 0.0 - 100.0 pg/mL  I-stat troponin, ED     Status: None   Collection Time: 10/12/16  3:06 PM  Result Value  Ref Range   Troponin i, poc 0.02 0.00 - 0.08 ng/mL   Comment 3            Comment: Due to the release kinetics of cTnI, a negative result within the first hours of the onset of symptoms does not rule out myocardial infarction with certainty. If myocardial infarction is still suspected, repeat the test at appropriate intervals.   Troponin I     Status: None   Collection Time: 10/13/16 12:15 AM  Result Value Ref Range   Troponin I <0.03 <0.03 ng/mL  Lipid panel     Status: Abnormal   Collection Time: 10/13/16 12:37 AM  Result Value Ref Range   Cholesterol 122 0 - 200 mg/dL   Triglycerides 68 <150 mg/dL   HDL 34 (L) >40 mg/dL   Total CHOL/HDL Ratio 3.6 RATIO   VLDL 14 0 - 40 mg/dL   LDL Cholesterol 74 0 - 99 mg/dL    Comment:        Total Cholesterol/HDL:CHD Risk Coronary Heart Disease Risk Table                     Men   Women  1/2 Average Risk   3.4   3.3  Average Risk  5.0   4.4  2 X Average Risk   9.6   7.1  3 X Average Risk  23.4   11.0        Use the calculated Patient Ratio above and the CHD Risk Table to determine the patient's CHD Risk.        ATP III CLASSIFICATION (LDL):  <100     mg/dL   Optimal  100-129  mg/dL   Near or Above                    Optimal  130-159  mg/dL   Borderline  160-189  mg/dL   High  >190     mg/dL   Very High   Troponin I     Status: None   Collection Time: 10/13/16  4:55 AM  Result Value Ref Range   Troponin I <0.03 <0.03 ng/mL  Basic metabolic panel     Status: Abnormal   Collection Time: 10/13/16  4:55 AM  Result Value Ref Range   Sodium 138 135 - 145 mmol/L   Potassium 4.0 3.5 - 5.1 mmol/L   Chloride 108 101 - 111 mmol/L   CO2 25 22 - 32 mmol/L   Glucose, Bld 97 65 - 99 mg/dL   BUN 24 (H) 6 - 20 mg/dL   Creatinine, Ser 1.31 (H) 0.44 - 1.00 mg/dL   Calcium 9.3 8.9 - 10.3 mg/dL   GFR calc non Af Amer 43 (L) >60 mL/min   GFR calc Af Amer 50 (L) >60 mL/min    Comment: (NOTE) The eGFR has been calculated using the CKD  EPI equation. This calculation has not been validated in all clinical situations. eGFR's persistently <60 mL/min signify possible Chronic Kidney Disease.    Anion gap 5 5 - 15  Protime-INR     Status: None   Collection Time: 10/13/16  4:55 AM  Result Value Ref Range   Prothrombin Time 13.4 11.4 - 15.2 seconds   INR 1.02     ECG   Sinus rhythm with PVC's, lateral T wave changes - Personally Reviewed  Telemetry   Sinus with PVC'* - Personally Reviewed  Radiology   Dg Chest 2 View  Result Date: 10/12/2016 CLINICAL DATA:  Nausea, dizziness and shortness of breath, 1 week duration. EXAM: CHEST  2 VIEW COMPARISON:  None FINDINGS: Heart size is normal. There is aortic atherosclerosis. The lungs are clear. The vascularity is normal. No effusions. No significant bone finding. IMPRESSION: No active disease. Electronically Signed   By: Nelson Chimes M.D.   On: 10/12/2016 15:58    Cardiac Studies   N/A  Assessment   Principal Problem:   Unstable angina (HCC) Active Problems:   Adrenal insufficiency (HCC)   Ischemic cardiomyopathy   S/P drug eluting coronary stent placement   Type 2 diabetes mellitus without complication, with long-term current use of insulin (HCC)   Chest tightness or pressure   Acute on chronic combined systolic and diastolic CHF (congestive heart failure) ([place])   Plan   1. Mrs. Steinhaus presents with acute nitrate responsive chest pain. She has a stress test yesterday which is not in the computer, but reportedly was "normal" with improved LVEF to 45%. He dyspnea has been escalating. Symptoms are concerning for unstable angina. I would recommend admission and definitive heart catheterization tomorrow. I discussed the procedure, including risks, benefits and alternatives with her and she is willing to proceed. Would also be reasonable to treat with nebulizers +/-  steroids if his is COPD exacerbation - she continues to smoke and doesn't want to quit at this time.  I  will d/w Dr. Raliegh Ip as well.  Time Spent Directly with Patient:  I have spent a total of 35 minutes with patient reviewing hospital notes, telemetry, EKGs, labs and examining the patient as well as establishing an assessment and plan that was discussed with the patient. > 50% of time was spent in direct patient care.   Length of Stay:  LOS: 0 days   Pixie Casino, MD, [place]  Attending Cardiologist  Direct Dial: [phone]  Fax: [phone]  Website: [email or web address] 10/13/2016, 7:40 AM

## 2016-10-13 NOTE — Progress Notes (Addendum)
CBG 66. 4oz orange juice po. Repeat CBG at 1118  114

## 2016-10-13 NOTE — Progress Notes (Signed)
  Echocardiogram 2D Echocardiogram has been performed.  Tae Robak T Tahtiana Rozier 10/13/2016, 3:48 PM

## 2016-10-13 NOTE — Progress Notes (Signed)
Text paged the Cardiology fellow re: Crystal Yates had a 4 beat run of V-tach but was asymptomatic with VSS and no s/s of complications, Troponins are negative x 1 and lab is in the room now drawing AM labwork. Patient has remained NPO since MN for Heart cath today, will continue to monitor.

## 2016-10-13 NOTE — Progress Notes (Signed)
Per d/w MD, ok to resume home topamax, mirapex. Hold off on diuretics and Entresto pending stabilization of BP given tendency towards lower side. He plans reassessment in AM with possible DC tomorrow if feeling better/workup unremarkable. Dayna Dunn PA-C

## 2016-10-14 ENCOUNTER — Encounter (HOSPITAL_COMMUNITY): Payer: Self-pay | Admitting: Physician Assistant

## 2016-10-14 ENCOUNTER — Observation Stay (HOSPITAL_COMMUNITY): Payer: Federal, State, Local not specified - PPO

## 2016-10-14 DIAGNOSIS — J441 Chronic obstructive pulmonary disease with (acute) exacerbation: Principal | ICD-10-CM

## 2016-10-14 DIAGNOSIS — R0789 Other chest pain: Secondary | ICD-10-CM | POA: Diagnosis not present

## 2016-10-14 DIAGNOSIS — J9811 Atelectasis: Secondary | ICD-10-CM | POA: Diagnosis not present

## 2016-10-14 DIAGNOSIS — I200 Unstable angina: Secondary | ICD-10-CM | POA: Diagnosis not present

## 2016-10-14 DIAGNOSIS — J449 Chronic obstructive pulmonary disease, unspecified: Secondary | ICD-10-CM

## 2016-10-14 DIAGNOSIS — E2740 Unspecified adrenocortical insufficiency: Secondary | ICD-10-CM | POA: Diagnosis not present

## 2016-10-14 DIAGNOSIS — R0602 Shortness of breath: Secondary | ICD-10-CM | POA: Diagnosis not present

## 2016-10-14 LAB — HEMOGLOBIN A1C
HEMOGLOBIN A1C: 6.1 % — AB (ref 4.8–5.6)
MEAN PLASMA GLUCOSE: 128 mg/dL

## 2016-10-14 LAB — BASIC METABOLIC PANEL
Anion gap: 7 (ref 5–15)
BUN: 21 mg/dL — AB (ref 6–20)
CALCIUM: 9.5 mg/dL (ref 8.9–10.3)
CO2: 24 mmol/L (ref 22–32)
Chloride: 109 mmol/L (ref 101–111)
Creatinine, Ser: 1.18 mg/dL — ABNORMAL HIGH (ref 0.44–1.00)
GFR calc Af Amer: 56 mL/min — ABNORMAL LOW (ref >60)
GFR, EST NON AFRICAN AMERICAN: 49 mL/min — AB (ref >60)
GLUCOSE: 102 mg/dL — AB (ref 65–99)
Potassium: 4 mmol/L (ref 3.5–5.1)
Sodium: 140 mmol/L (ref 135–145)

## 2016-10-14 LAB — GLUCOSE, CAPILLARY
GLUCOSE-CAPILLARY: 149 mg/dL — AB (ref 65–99)
Glucose-Capillary: 102 mg/dL — ABNORMAL HIGH (ref 65–99)
Glucose-Capillary: 158 mg/dL — ABNORMAL HIGH (ref 65–99)

## 2016-10-14 LAB — HIV ANTIBODY (ROUTINE TESTING W REFLEX): HIV SCREEN 4TH GENERATION: NONREACTIVE

## 2016-10-14 MED ORDER — TECHNETIUM TC 99M DIETHYLENETRIAME-PENTAACETIC ACID: 29.0000 | Freq: Once | INTRAVENOUS | Status: DC | PRN

## 2016-10-14 MED ORDER — IPRATROPIUM-ALBUTEROL 0.5-2.5 (3) MG/3ML IN SOLN
3.0000 mL | Freq: Four times a day (QID) | RESPIRATORY_TRACT | Status: DC | PRN
  Administered 2016-10-14: 3 mL via RESPIRATORY_TRACT
  Filled 2016-10-14: qty 3

## 2016-10-14 MED ORDER — BUMETANIDE 1 MG PO TABS
1.0000 mg | ORAL_TABLET | Freq: Every day | ORAL | Status: DC
  Administered 2016-10-14: 1 mg via ORAL
  Filled 2016-10-14: qty 1

## 2016-10-14 MED ORDER — TECHNETIUM TO 99M ALBUMIN AGGREGATED
4.0000 | Freq: Once | INTRAVENOUS | Status: AC | PRN
  Administered 2016-10-14: 4 via INTRAVENOUS

## 2016-10-14 MED FILL — Heparin Sodium (Porcine) 2 Unit/ML in Sodium Chloride 0.9%: INTRAMUSCULAR | Qty: 500 | Status: AC

## 2016-10-14 NOTE — Progress Notes (Signed)
DAILY PROGRESS NOTE   Patient Name: Crystal Yates Date of Encounter: 10/14/2016  Hospital Problem List   Principal Problem:   Unstable angina [facility]) Active Problems:   Adrenal insufficiency ([place])   Ischemic cardiomyopathy   S/P drug eluting coronary stent placement   Type 2 diabetes mellitus without complication, with long-term current use of insulin (H[facility])   Chest tightness or pressure   Acute on chronic combined systolic and diastolic CHF (congestive heart failure) ([place])    Chief Complaint   Short of breath  Subjective   Appreciate pulmonary evaluation yesterday- her dyspnea persists. Will try nebulizer treatment today- d/w RT - they are providing the Anoro Ellipta. Pulmonary also wanted to r/o PE - her d-dimer mildly elevated- Plan for V/Q scan today. Pro-BNP elevated, however BNP normal. Weight down 2 lbs.   Objective   Vitals:   10/13/16 1110 10/13/16 1706 10/13/16 2121 10/14/16 0550  BP: (!) 101/50 114/76 (!) 90/53 (!) 103/40  Pulse:  80 (!) 106 86  Resp: '12  13 15  ' Temp:   97.7 F (36.5 C) 98.6 F (37 C)  TempSrc:   Oral Oral  SpO2:   91% 99%  Weight:      Height:        Intake/Output Summary (Last 24 hours) at 10/14/16 0906 Last data filed at 10/14/16 0753  Gross per 24 hour  Intake              480 ml  Output              600 ml  Net             -120 ml   Filed Weights   10/12/16 2330 10/13/16 0400  Weight: 193 lb 11.2 oz (87.9 kg) 191 lb 12.8 oz (87 kg)    Physical Exam   General appearance: alert and no distress Lungs: rhonchi LUL and RUL and wheezes faint end-expiratory Heart: regular rate and rhythm Extremities: extremities normal, atraumatic, no cyanosis or edema Neurologic: Grossly normal  Inpatient Medications    Scheduled Meds: . aspirin EC  81 mg Oral Daily  . atorvastatin  20 mg Oral Daily  . carvedilol  3.125 mg Oral BID WC  . clopidogrel  75 mg Oral Daily  . heparin  5,000 Units Subcutaneous Q8H  . hydrocortisone  10 mg  Oral Daily  . hydrocortisone  5 mg Oral Daily  . insulin aspart  0-9 Units Subcutaneous TID WC  . levothyroxine  125 mcg Oral QAC breakfast  . pramipexole  1 mg Oral BID  . sodium chloride flush  3 mL Intravenous Q12H  . topiramate  300 mg Oral QHS  . traZODone  50 mg Oral Daily  . umeclidinium-vilanterol  1 puff Inhalation Daily    Continuous Infusions: . sodium chloride      PRN Meds: sodium chloride, acetaminophen, nitroGLYCERIN, ondansetron (ZOFRAN) IV, sodium chloride flush, traMADol   Labs   Results for orders placed or performed during the hospital encounter of 10/12/16 (from the past 48 hour(s))  Basic metabolic panel     Status: Abnormal   Collection Time: 10/12/16  2:30 PM  Result Value Ref Range   Sodium 137 135 - 145 mmol/L   Potassium 4.2 3.5 - 5.1 mmol/L   Chloride 106 101 - 111 mmol/L   CO2 24 22 - 32 mmol/L   Glucose, Bld 99 65 - 99 mg/dL   BUN 27 (H) 6 - 20 mg/dL   Creatinine,  Ser 1.36 (H) 0.44 - 1.00 mg/dL   Calcium 9.5 8.9 - 10.3 mg/dL   GFR calc non Af Amer 41 (L) >60 mL/min   GFR calc Af Amer 48 (L) >60 mL/min    Comment: (NOTE) The eGFR has been calculated using the CKD EPI equation. This calculation has not been validated in all clinical situations. eGFR's persistently <60 mL/min signify possible Chronic Kidney Disease.    Anion gap 7 5 - 15  CBC     Status: None   Collection Time: 10/12/16  2:30 PM  Result Value Ref Range   WBC 6.4 4.0 - 10.5 K/uL   RBC 4.57 3.87 - 5.11 MIL/uL   Hemoglobin 14.5 12.0 - 15.0 g/dL   HCT 43.4 36.0 - 46.0 %   MCV 95.0 78.0 - 100.0 fL   MCH 31.7 26.0 - 34.0 pg   MCHC 33.4 30.0 - 36.0 g/dL   RDW 15.0 11.5 - 15.5 %   Platelets 231 150 - 400 K/uL  Brain natriuretic peptide     Status: None   Collection Time: 10/12/16  2:30 PM  Result Value Ref Range   B Natriuretic Peptide 49.9 0.0 - 100.0 pg/mL  I-stat troponin, ED     Status: None   Collection Time: 10/12/16  3:06 PM  Result Value Ref Range   Troponin i,  poc 0.02 0.00 - 0.08 ng/mL   Comment 3            Comment: Due to the release kinetics of cTnI, a negative result within the first hours of the onset of symptoms does not rule out myocardial infarction with certainty. If myocardial infarction is still suspected, repeat the test at appropriate intervals.   HIV antibody (Routine Testing)     Status: None   Collection Time: 10/13/16 12:15 AM  Result Value Ref Range   HIV Screen 4th Generation wRfx Non Reactive Non Reactive    Comment: (NOTE) Performed At: [facility] [place], [place] [id number] Lindon Romp MD [id number]   Troponin I     Status: None   Collection Time: 10/13/16 12:15 AM  Result Value Ref Range   Troponin I <0.03 <0.03 ng/mL  Hemoglobin A1c     Status: Abnormal   Collection Time: 10/13/16 12:15 AM  Result Value Ref Range   Hgb A1c MFr Bld 6.1 (H) 4.8 - 5.6 %    Comment: (NOTE)         Pre-diabetes: 5.7 - 6.4         Diabetes: >6.4         Glycemic control for adults with diabetes: <7.0    Mean Plasma Glucose 128 mg/dL    Comment: (NOTE) Performed At: [facility] [place], [place] [id number] Lindon Romp MD [id number]   Lipid panel     Status: Abnormal   Collection Time: 10/13/16 12:37 AM  Result Value Ref Range   Cholesterol 122 0 - 200 mg/dL   Triglycerides 68 <150 mg/dL   HDL 34 (L) >40 mg/dL   Total CHOL/HDL Ratio 3.6 RATIO   VLDL 14 0 - 40 mg/dL   LDL Cholesterol 74 0 - 99 mg/dL    Comment:        Total Cholesterol/HDL:CHD Risk Coronary Heart Disease Risk Table                     Men   Women  1/2 Average Risk   3.4  3.3  Average Risk       5.0   4.4  2 X Average Risk   9.6   7.1  3 X Average Risk  23.4   11.0        Use the calculated Patient Ratio above and the CHD Risk Table to determine the patient's CHD Risk.        ATP III CLASSIFICATION (LDL):  <100     mg/dL   Optimal  100-129  mg/dL   Near or Above                     Optimal  130-159  mg/dL   Borderline  160-189  mg/dL   High  >190     mg/dL   Very High   Troponin I     Status: None   Collection Time: 10/13/16  4:55 AM  Result Value Ref Range   Troponin I <0.03 <0.03 ng/mL  Basic metabolic panel     Status: Abnormal   Collection Time: 10/13/16  4:55 AM  Result Value Ref Range   Sodium 138 135 - 145 mmol/L   Potassium 4.0 3.5 - 5.1 mmol/L   Chloride 108 101 - 111 mmol/L   CO2 25 22 - 32 mmol/L   Glucose, Bld 97 65 - 99 mg/dL   BUN 24 (H) 6 - 20 mg/dL   Creatinine, Ser 1.31 (H) 0.44 - 1.00 mg/dL   Calcium 9.3 8.9 - 10.3 mg/dL   GFR calc non Af Amer 43 (L) >60 mL/min   GFR calc Af Amer 50 (L) >60 mL/min    Comment: (NOTE) The eGFR has been calculated using the CKD EPI equation. This calculation has not been validated in all clinical situations. eGFR's persistently <60 mL/min signify possible Chronic Kidney Disease.    Anion gap 5 5 - 15  Protime-INR     Status: None   Collection Time: 10/13/16  4:55 AM  Result Value Ref Range   Prothrombin Time 13.4 11.4 - 15.2 seconds   INR 1.02   Glucose, capillary     Status: None   Collection Time: 10/13/16 10:34 AM  Result Value Ref Range   Glucose-Capillary 66 65 - 99 mg/dL  Glucose, capillary     Status: Abnormal   Collection Time: 10/13/16 11:18 AM  Result Value Ref Range   Glucose-Capillary 114 (H) 65 - 99 mg/dL  Troponin I     Status: None   Collection Time: 10/13/16 12:04 PM  Result Value Ref Range   Troponin I <0.03 <0.03 ng/mL  CBC     Status: None   Collection Time: 10/13/16 12:04 PM  Result Value Ref Range   WBC 4.8 4.0 - 10.5 K/uL   RBC 4.17 3.87 - 5.11 MIL/uL   Hemoglobin 12.9 12.0 - 15.0 g/dL   HCT 39.9 36.0 - 46.0 %   MCV 95.7 78.0 - 100.0 fL   MCH 30.9 26.0 - 34.0 pg   MCHC 32.3 30.0 - 36.0 g/dL   RDW 14.9 11.5 - 15.5 %   Platelets 204 150 - 400 K/uL  Creatinine, serum     Status: Abnormal   Collection Time: 10/13/16 12:04 PM  Result Value Ref Range   Creatinine, Ser  1.13 (H) 0.44 - 1.00 mg/dL   GFR calc non Af Amer 51 (L) >60 mL/min   GFR calc Af Amer 60 (L) >60 mL/min    Comment: (NOTE) The eGFR has been calculated using the CKD  EPI equation. This calculation has not been validated in all clinical situations. eGFR's persistently <60 mL/min signify possible Chronic Kidney Disease.   Glucose, capillary     Status: Abnormal   Collection Time: 10/13/16  5:23 PM  Result Value Ref Range   Glucose-Capillary 153 (H) 65 - 99 mg/dL  APTT     Status: None   Collection Time: 10/13/16  6:50 PM  Result Value Ref Range   aPTT 27 24 - 36 seconds  D-dimer, quantitative (not at [facility])     Status: Abnormal   Collection Time: 10/13/16  6:50 PM  Result Value Ref Range   D-Dimer, Quant 0.64 (H) 0.00 - 0.50 ug/mL-FEU    Comment: (NOTE) At the manufacturer cut-off of 0.50 ug/mL FEU, this assay has been documented to exclude PE with a sensitivity and negative predictive value of 97 to 99%.  At this time, this assay has not been approved by the FDA to exclude DVT/VTE. Results should be correlated with clinical presentation.   Glucose, capillary     Status: Abnormal   Collection Time: 10/13/16  9:05 PM  Result Value Ref Range   Glucose-Capillary 123 (H) 65 - 99 mg/dL  Basic metabolic panel     Status: Abnormal   Collection Time: 10/14/16  3:35 AM  Result Value Ref Range   Sodium 140 135 - 145 mmol/L   Potassium 4.0 3.5 - 5.1 mmol/L   Chloride 109 101 - 111 mmol/L   CO2 24 22 - 32 mmol/L   Glucose, Bld 102 (H) 65 - 99 mg/dL   BUN 21 (H) 6 - 20 mg/dL   Creatinine, Ser 1.18 (H) 0.44 - 1.00 mg/dL   Calcium 9.5 8.9 - 10.3 mg/dL   GFR calc non Af Amer 49 (L) >60 mL/min   GFR calc Af Amer 56 (L) >60 mL/min    Comment: (NOTE) The eGFR has been calculated using the CKD EPI equation. This calculation has not been validated in all clinical situations. eGFR's persistently <60 mL/min signify possible Chronic Kidney Disease.    Anion gap 7 5 - 15  Glucose, capillary      Status: Abnormal   Collection Time: 10/14/16  7:48 AM  Result Value Ref Range   Glucose-Capillary 102 (H) 65 - 99 mg/dL    ECG   N/A  Telemetry   Sinus rhythm - Personally Reviewed  Radiology    Dg Chest 2 View  Result Date: 10/12/2016 CLINICAL DATA:  Nausea, dizziness and shortness of breath, 1 week duration. EXAM: CHEST  2 VIEW COMPARISON:  None FINDINGS: Heart size is normal. There is aortic atherosclerosis. The lungs are clear. The vascularity is normal. No effusions. No significant bone finding. IMPRESSION: No active disease. Electronically Signed   By: Nelson Chimes M.D.   On: 10/12/2016 15:58    Cardiac Studies   N/A  Assessment   1. Principal Problem: 2.   Unstable angina ([place]) 3. Active Problems: 4.   Adrenal insufficiency ([place]) 5.   Ischemic cardiomyopathy 6.   S/P drug eluting coronary stent placement 7.   Type 2 diabetes mellitus without complication, with long-term current use of insulin ([place]) 8.   Chest tightness or pressure 9.   Acute on chronic combined systolic and diastolic CHF (congestive heart failure) ([place]) 10.   Plan   1. Awaiting V/Q scan - restart home bumex today. She is back on hydrocortisone for "empty sella" syndrome/pituitary apoplexy. Will give duoneb treatment, restart Anoro Ellipta. If V/Q negative, anticipate  d/c home later today.  Time Spent Directly with Patient:  I have spent a total of 15 minutes with the patient reviewing hospital notes, telemetry, EKGs, labs and examining the patient as well as establishing an assessment and plan that was discussed personally with the patient. > 50% of time was spent in direct patient care.  Length of Stay:  LOS: 0 days   Pixie Casino, MD, [place]  Attending Cardiologist  Direct Dial: [phone]  Fax: [phone]  Website:  [email or web address] 10/14/2016, 9:06 AM

## 2016-10-14 NOTE — Plan of Care (Signed)
Problem: Pain Managment: Goal: General experience of comfort will improve Outcome: Progressing Denies c/o pain or discomfort at this time.  Problem: Physical Regulation: Goal: Ability to maintain clinical measurements within normal limits will improve Outcome: Progressing VSS

## 2016-10-14 NOTE — Discharge Summary (Signed)
Discharge Summary    Patient ID: Crystal Yates,  MRN: [id number], DOB/AGE: 04-04-1955 61 y.o.  Admit date: 10/12/2016 Discharge date: 10/14/2016  Primary Care Provider: Dortha Kern Primary Cardiologist: Dr. Agustin Cree  Discharge Diagnoses    Principal Problem:   Dyspnea on exertion Active Problems:   COPD exacerbation ([place])   Adrenal insufficiency (HCC)   Coronary artery disease   Hyperlipemia   Hypertension   Hypothyroid   Ischemic cardiomyopathy   S/P drug eluting coronary stent placement   Shortness of breath   Tobacco abuse   Type 2 diabetes mellitus without complication, with long-term current use of insulin (HCC)   Chest tightness or pressure   Chronic combined systolic and diastolic CHF (congestive heart failure) [facility])   Diagnostic Studies/Procedures    Cardiac Cath Conclusion   Findings: 1. Non-obstructive CAD. 2. Patent stents in LAD and RCA with mild to moderate in-stent restenosis. 3. Upper normal to mildly elevated left ventricular filling pressure. 4. Moderate to severely reduced LVEF with global hypokinesis most pronounced along the inferior wall and apex.  Recommendations: 1. Secondary prevention of CAD. 2. Medical management of chronic systolic heart failure; LVEDP not markedly elevated to explain patient's symptoms. 3. Obtain TTE to better assess LV function. 4. Consider evaluation for underlying pulmonary condition contributing to patient's symptoms.  Nelva Bush, MD [place] Pager: [phone]   2D Echo 10/13/16 Study Conclusions  - Left ventricle: The cavity size was normal. There was mild   concentric hypertrophy. Systolic function was mildly to   moderately reduced. The estimated ejection fraction was in the   range of 40% to 45%. Hypokinesis of the anteroseptal, inferior,   and inferoseptal myocardium. Doppler parameters are consistent   with abnormal left ventricular relaxation (grade 1 diastolic   dysfunction). -  Aortic valve: There was no regurgitation. - Mitral valve: Transvalvular velocity was within the normal range.   There was no evidence for stenosis. There was no regurgitation. - Right ventricle: The cavity size was normal. Wall thickness was   normal. Systolic function was normal. - Atrial septum: No defect or patent foramen ovale was identified. - Tricuspid valve: There was no regurgitation.    _____________     History of Present Illness     Crystal Yates is a 61 y.o. female with history of CAD (prior stenting, last DESx2 03/2016 to the RCA), ongoing tobacco abuse, chronic combined? CHF, COPD, HTN, HLD, Hashimoto's thyroiditis, DM, probable CKD stage II-III per labs, secondary adrenal insufficiency, also with stroke and Sjogren's syndrome listed in chart who presented to [facility] with worsening chest tightness and SOB. She saw Dr. Agustin Cree last week for preoperative workup for neck surgery and was ordered to have a stress test. She has a history of CHF with EF 35%. He ordered a myoview which was performed yesterday and was reportedly "negative" with improved LVEF to 45%, however, those results were not in our computer system. She has been having increasing chest tightness. Recently she was treated for pneumonia in May. She has had a cough since then. She had seen little benefit from nebulizers. The tightness was band-like around the chest, not worse with breathing but definitely associated with exertion and relieved by rest.  EKG showed sinus rhythm, PVC's and lateral T wave changes. Nitroglycerin seemed to help her pain. She was admitted for further eval. Troponin was negative. CBC wnl. BUN was 27 with CR 1.36 indicative of AKI on probable CKD (Prior Cr 6/20 was 1.2 with BUN 32,  up crom previous of 12/1.0 on 6/8). Additionally her BP was low at times, as low as 81/39. Previous recent OP BP was 105/49. Bumex, Lasix, Imdur and Entresto were held.   Hospital Course    Her symptoms were felt possible  due to unstable angina, prompting cardiac cath yesterday showing: 1. Non-obstructive CAD. 2. Patent stents in LAD and RCA with mild to moderate in-stent restenosis. 3. Upper normal to mildly elevated left ventricular filling pressure. 4. Moderate to severely reduced LVEF with global hypokinesis most pronounced along the inferior wall and apex.  Her LVEDP was not markedly elevated to explain her symptoms. CXR showed lung hyperexpansion and bibasilar atelectasis/scar without superimposed acute cardiopulmonary disease. 2D echo showed mild LVH, EF 40-45%, hypokinesis of  the anteroseptal, inferior, and inferoseptal myocardium, grade 1 DD. Pulm consulted who felt dyspnea was likely multifactorial in setting of COPD/ongoing tobacco abuse. She was treated with nebs as inpatient and asked to resume Anoro at discharge. D-dimer was minimally elevated but VQ was negative. Post-cath CR was stable at 1.18. No overt cause for her dyspnea/chest tightness was found but it has improved today. She ambulated without desaturation this afternoon and did not have any chest pain or dyspnea. It's not clear why she was on two diuretics (Bumex and Lasix) but Dr. Debara Pickett recommended to continue Bumex, but hold Entresto/Imdur for now given her low BP - would recommend considering re-initiation at lower doses as OP depending on BP. I have sent a message to our [facility] office's schedulers requesting a follow-up appointment for our [facility] team/Dr. Agustin Cree, and our office will call the patient with this information. Dr. Debara Pickett has seen and examined the patient today and feels he is stable for discharge.  Smoking cessation strongly advised. She also had ibuprofen stopped off her med list due to per probable CKD II-III. She was asked to contact her endocrinologist to discuss her recently lower BPs. Her hydrocortisone dose was decreased a few weeks ago due to weight gain. With lower pressure recently, may need to consider increasing if  felt appropriate.  Consultants: N/A _____________  Discharge Vitals Blood pressure (!) 96/52, pulse 97, temperature 97.6 F (36.4 C), temperature source Oral, resp. rate 20, height 5\' 2"  (1.575 m), weight 191 lb 12.8 oz (87 kg), SpO2 100 %.  Filed Weights   10/12/16 2330 10/13/16 0400  Weight: 193 lb 11.2 oz (87.9 kg) 191 lb 12.8 oz (87 kg)    Labs & Radiologic Studies    CBC  Recent Labs  10/12/16 1430 10/13/16 1204  WBC 6.4 4.8  HGB 14.5 12.9  HCT 43.4 39.9  MCV 95.0 95.7  PLT 231 546   Basic Metabolic Panel  Recent Labs  10/13/16 0455 10/13/16 1204 10/14/16 0335  NA 138  --  140  K 4.0  --  4.0  CL 108  --  109  CO2 25  --  24  GLUCOSE 97  --  102*  BUN 24*  --  21*  CREATININE 1.31* 1.13* 1.18*  CALCIUM 9.3  --  9.5    Cardiac Enzymes  Recent Labs  10/13/16 0015 10/13/16 0455 10/13/16 1204  TROPONINI <0.03 <0.03 <0.03   BNP Invalid input(s): POCBNP D-Dimer  Recent Labs  10/13/16 1850  DDIMER 0.64*   Hemoglobin A1C  Recent Labs  10/13/16 0015  HGBA1C 6.1*   Fasting Lipid Panel  Recent Labs  10/13/16 0037  CHOL 122  HDL 34*  LDLCALC 74  TRIG 68  CHOLHDL  3.6  _____________  Dg Chest 2 View  Result Date: 10/14/2016 CLINICAL DATA:  Pre VQ examination. History of COPD. Concern for pulmonary embolism. EXAM: CHEST  2 VIEW COMPARISON:  10/12/2016; 09/15/2016; 08/13/2016; chest CT - 08/16/2016 FINDINGS: Grossly unchanged cardiac silhouette and mediastinal contours with atherosclerotic plaque within the thoracic aorta. The lungs remain hyperexpanded with flattening of the bilateral diaphragms. Grossly unchanged bibasilar heterogeneous opacities, left greater than right, likely atelectasis or scar. No new focal airspace opacities. No pleural effusion or pneumothorax. No evidence of edema. No acute osseus abnormalities. IMPRESSION: 1. Similar findings of lung hyperexpansion and bibasilar atelectasis/scar without superimposed acute  cardiopulmonary disease. 2.  Aortic Atherosclerosis (ICD10-I70.0). Electronically Signed   By: Sandi Mariscal M.D.   On: 10/14/2016 16:19   Dg Chest 2 View  Result Date: 10/12/2016 CLINICAL DATA:  Nausea, dizziness and shortness of breath, 1 week duration. EXAM: CHEST  2 VIEW COMPARISON:  None FINDINGS: Heart size is normal. There is aortic atherosclerosis. The lungs are clear. The vascularity is normal. No effusions. No significant bone finding. IMPRESSION: No active disease. Electronically Signed   By: Nelson Chimes M.D.   On: 10/12/2016 15:58   Nm Pulmonary Perf And Vent  Result Date: 10/14/2016 CLINICAL DATA:  Chest tightness, cough and shortness of breath for the past week. History of smoking, COPD and CHF. Evaluate for pulmonary embolism. EXAM: NUCLEAR MEDICINE VENTILATION - PERFUSION LUNG SCAN TECHNIQUE: Ventilation images were obtained in multiple projections using inhaled aerosol Tc-61m DTPA. Perfusion images were obtained in multiple projections after intravenous injection of Tc-39m MAA. RADIOPHARMACEUTICALS:  Twenty-nine mCi Technetium-27m DTPA aerosol inhalation and 4.0 mCi Technetium-71m MAA IV COMPARISON:  Chest radiograph - earlier same day; 10/12/2016; 09/15/2016; chest CT - 08/16/2016 FINDINGS: Review of chest radiograph performed earlier today demonstrates grossly unchanged cardiac silhouette and mediastinal contours. The lungs remain hyperexpanded with flattening of the diaphragms. Unchanged bibasilar heterogeneous opacities, left greater than right, likely atelectasis or scar. No pleural effusion or pneumothorax. No definite evidence of edema. Ventilation: Review of the ventilatory images demonstrates relative homogeneous distribution of inhaled radiotracer throughout the bilateral pulmonary parenchyma. Perfusion: There is homogeneous distribution of injected radiotracer throughout the bilateral pulmonary parenchyma. No discrete segmental mismatched filling defects to suggest pulmonary  embolism. IMPRESSION: Pulmonary embolism absent (very low probability of pulmonary embolism). Electronically Signed   By: Sandi Mariscal M.D.   On: 10/14/2016 16:16   Disposition   Pt is being discharged home today in good condition.  Follow-up Plans & Appointments     Discharge Instructions    Diet - low sodium heart healthy    Complete by:  As directed    Increase activity slowly    Complete by:  As directed    No driving for 2 days. No lifting over 5 lbs for 1 week. No sexual activity for 1 week. You may return to work in 3 days if feeling well (if applicable). Keep procedure site clean & dry. If you notice increased pain, swelling, bleeding or pus, call/return!  You may shower, but no soaking baths/hot tubs/pools for 1 week.   It is imperative that you quit smoking. Your breathing is likely to worsen if you do not quit.  In reviewing your labs, your kidney function always looks to be on the border of abnormal, consistent with chronic kidney disease stage 2-3. Patients with kidney dysfunction should generally stay away from medicines like ibuprofen, Advil, Motrin, naproxen, and Aleve due to risk of worsening kidney function. Ibuprofen was  removed from your medicine list.  Due to your low blood pressure, Dr. Debara Pickett recommended you not restart Lasix, Entresto or Imdur at this time (generic names listed above). Do not throw these away though, as Dr. Agustin Cree may choose to gradually add them back to your regimen as blood pressure allows.      Discharge Medications   Allergies as of 10/14/2016      Reactions   Hydroxychloroquine Shortness Of Breath, Nausea Only, Other (See Comments)   Dizziness (also)   Donepezil Other (See Comments)   Dizziness, depression, and makes the patient feel "funny"   Prednisone Other (See Comments)   Causes depression and suicidal thoughts   Bupropion Other (See Comments)   Suicidal thoughts   Contrast Media [iodinated Diagnostic Agents]    "Blows the vein"  and contrast gathers at the injected site's limb   Metrizamide Other (See Comments)   (a non-ionic radiopaque contrast agent) "Blows the vein" and contrast gathers at the injected site's limb   Varenicline Other (See Comments)   Suicidal thoughts   Tape Rash   Paper tape is preferred, PLEASE      Medication List    STOP taking these medications   ENTRESTO 49-51 MG Generic drug:  sacubitril-valsartan   furosemide 20 MG tablet Commonly known as:  LASIX   ibuprofen 200 MG tablet Commonly known as:  ADVIL,MOTRIN   isosorbide mononitrate 60 MG 24 hr tablet Commonly known as:  IMDUR     TAKE these medications   aspirin EC 81 MG tablet Take 81 mg by mouth daily.   atorvastatin 20 MG tablet Commonly known as:  LIPITOR Take 20 mg by mouth daily.   bumetanide 1 MG tablet Commonly known as:  BUMEX Take 1 mg by mouth daily.   carvedilol 3.125 MG tablet Commonly known as:  COREG Take 3.125 mg by mouth 2 (two) times daily with a meal.   clopidogrel 75 MG tablet Commonly known as:  PLAVIX Take 75 mg by mouth daily.   EMERGEN-C IMMUNE PO Take 1 capsule by mouth daily. CHEW   FUSION PLUS Caps Take 1 capsule by mouth daily.   hydrocortisone 5 MG tablet Commonly known as:  CORTEF Take 5-10 mg by mouth See admin instructions. 10 mg in the morning then 5 mg midday   lidocaine 5 % ointment Commonly known as:  XYLOCAINE Apply 1 application topically as needed (for neck pain).   nitroGLYCERIN 0.4 MG SL tablet Commonly known as:  NITROSTAT Place 0.4 mg under the tongue every 5 (five) minutes as needed for chest pain.   pramipexole 1 MG tablet Commonly known as:  MIRAPEX Take 1 mg by mouth 2 (two) times daily.   SYNTHROID 125 MCG tablet Generic drug:  levothyroxine Take 125 mcg by mouth daily.   topiramate 100 MG tablet Commonly known as:  TOPAMAX Take 300 mg by mouth at bedtime.   traMADol 50 MG tablet Commonly known as:  ULTRAM Take 50 mg by mouth at bedtime as  needed (for pain).   traZODone 50 MG tablet Commonly known as:  DESYREL Take 50 mg by mouth at bedtime.   TRESIBA FLEXTOUCH 100 UNIT/ML Sopn FlexTouch Pen Generic drug:  insulin degludec Inject 20 Units into the skin at bedtime.   TRULICITY 1.5 JJ/8.8CZ Sopn Generic drug:  Dulaglutide Inject 1.5 mg into the skin every 7 (seven) days.   umeclidinium-vilanterol 62.5-25 MCG/INH Aepb Commonly known as:  ANORO ELLIPTA Inhale 1 puff into the lungs daily.  Vitamin D3 2000 units capsule Take 2,000 Units by mouth daily.        Allergies:  Allergies  Allergen Reactions  . Hydroxychloroquine Shortness Of Breath, Nausea Only and Other (See Comments)    Dizziness (also)  . Donepezil Other (See Comments)    Dizziness, depression, and makes the patient feel "funny"  . Prednisone Other (See Comments)    Causes depression and suicidal thoughts  . Bupropion Other (See Comments)    Suicidal thoughts  . Contrast Media [Iodinated Diagnostic Agents]     "Blows the vein" and contrast gathers at the injected site's limb  . Metrizamide Other (See Comments)    (a non-ionic radiopaque contrast agent) "Blows the vein" and contrast gathers at the injected site's limb  . Varenicline Other (See Comments)    Suicidal thoughts  . Tape Rash    Paper tape is preferred, PLEASE     Outstanding Labs/Studies   n/a  Duration of Discharge Encounter   Greater than 30 minutes including physician time.  Signed, Charlie Pitter PA-C 10/14/2016, 5:13 PM

## 2016-10-14 NOTE — Progress Notes (Addendum)
Patient ambulated 500 ft around halls. Oxygen saturation remained at 93% or higher on Room Air throughout ambulation. Crystal Yates

## 2016-10-17 DIAGNOSIS — J310 Chronic rhinitis: Secondary | ICD-10-CM | POA: Diagnosis not present

## 2016-10-18 DIAGNOSIS — M6281 Muscle weakness (generalized): Secondary | ICD-10-CM | POA: Diagnosis not present

## 2016-10-18 DIAGNOSIS — M79641 Pain in right hand: Secondary | ICD-10-CM | POA: Diagnosis not present

## 2016-10-18 DIAGNOSIS — M79631 Pain in right forearm: Secondary | ICD-10-CM | POA: Diagnosis not present

## 2016-10-19 DIAGNOSIS — Z01818 Encounter for other preprocedural examination: Secondary | ICD-10-CM | POA: Diagnosis not present

## 2016-10-19 DIAGNOSIS — M4802 Spinal stenosis, cervical region: Secondary | ICD-10-CM | POA: Diagnosis not present

## 2016-10-19 DIAGNOSIS — M5412 Radiculopathy, cervical region: Secondary | ICD-10-CM | POA: Diagnosis not present

## 2016-10-20 ENCOUNTER — Encounter: Payer: Self-pay | Admitting: Cardiology

## 2016-10-20 ENCOUNTER — Ambulatory Visit (INDEPENDENT_AMBULATORY_CARE_PROVIDER_SITE_OTHER): Payer: Federal, State, Local not specified - PPO | Admitting: Cardiology

## 2016-10-20 VITALS — BP 120/66 | HR 72 | Resp 12 | Ht 62.0 in | Wt 195.0 lb

## 2016-10-20 DIAGNOSIS — I2510 Atherosclerotic heart disease of native coronary artery without angina pectoris: Secondary | ICD-10-CM

## 2016-10-20 DIAGNOSIS — I255 Ischemic cardiomyopathy: Secondary | ICD-10-CM | POA: Diagnosis not present

## 2016-10-20 DIAGNOSIS — I5042 Chronic combined systolic (congestive) and diastolic (congestive) heart failure: Secondary | ICD-10-CM

## 2016-10-20 DIAGNOSIS — I10 Essential (primary) hypertension: Secondary | ICD-10-CM | POA: Diagnosis not present

## 2016-10-20 MED ORDER — SACUBITRIL-VALSARTAN 24-26 MG PO TABS: 1.0000 | ORAL_TABLET | Freq: Two times a day (BID) | ORAL | 11 refills | Status: DC

## 2016-10-20 NOTE — Progress Notes (Signed)
Cardiology Office Note:    Date:  10/20/2016   ID:  Crystal Yates, DOB 1955-08-03, MRN [id number]  PCP:  Dortha Kern, PA  Cardiologist:  Jenne Campus, MD    Referring MD: Dortha Kern, [place]   Chief Complaint  Patient presents with  . Pre-op Exam  I'm doing much better  History of Present Illness:    Crystal Yates is a 61 y.o. female  with coronary artery disease. I saw her a few weeks ago for evaluation before surgery. She described exertional tightness. Stress test was done which was negative however she had been going to the emergency room with chest pain. Eventually cardiac catheterization was done which showed nonobstructive lesion. Right coronary artery artery had stenosis mild to moderate no target lesion for intervention. Because of hypotension some of her medication were withdrawn that include furosemide as well as isosorbide and Entresto since that time she substituting much better. No chest pain. I explained to her what was the purpose of those medications. And I asked her to start taking Entresto again. However, I told her if she feels before when she take the medication sheet to stop at the left me know.  Past Medical History:  Diagnosis Date  . CAD in native artery    a. Prior LAD stenting based on cath. b. RCA stenting 03/2016 x2.  . Chronic combined systolic and diastolic CHF (congestive heart failure) ([place])   . CKD (chronic kidney disease), stage II   . COPD (chronic obstructive pulmonary disease) ([place])   . Diabetes mellitus without complication ([place])   . Hashimoto's thyroiditis   . Hyperlipidemia   . Hypertension   . Secondary adrenal insufficiency ([place])   . Thyroid disease   . Tobacco abuse     Past Surgical History:  Procedure Laterality Date  . ABDOMINAL SURGERY    . CESAREAN SECTION    . CHOLECYSTECTOMY    . COLON RESECTION    . HERNIA MESH REMOVAL    . HERNIA REPAIR    . LEFT HEART CATH AND CORONARY ANGIOGRAPHY N/A 10/13/2016   Procedure:  Left Heart Cath and Coronary Angiography;  Surgeon: Nelva Bush, MD;  Location: [facility];  Service: Cardiovascular;  Laterality: N/A;  . SHOULDER ARTHROSCOPY    . TUBAL LIGATION      Current Medications: Current Meds  Medication Sig  . aspirin EC 81 MG tablet Take 81 mg by mouth daily.   Marland Kitchen atorvastatin (LIPITOR) 20 MG tablet Take 20 mg by mouth daily.  . bumetanide (BUMEX) 1 MG tablet Take 1 mg by mouth daily.  . carvedilol (COREG) 3.125 MG tablet Take 3.125 mg by mouth 2 (two) times daily with a meal.  . Cholecalciferol (VITAMIN D3) 2000 units capsule Take 2,000 Units by mouth daily.   . clopidogrel (PLAVIX) 75 MG tablet Take 75 mg by mouth daily.  . hydrocortisone (CORTEF) 5 MG tablet Take 5-10 mg by mouth See admin instructions. 10 mg in the morning then 5 mg midday  . insulin degludec (TRESIBA FLEXTOUCH) 100 UNIT/ML SOPN FlexTouch Pen Inject 20 Units into the skin at bedtime.  . Iron-FA-B Cmp-C-Biot-Probiotic (FUSION PLUS) CAPS Take 1 capsule by mouth daily.  Marland Kitchen levothyroxine (SYNTHROID) 125 MCG tablet Take 125 mcg by mouth daily.   Marland Kitchen lidocaine (XYLOCAINE) 5 % ointment Apply 1 application topically as needed (for neck pain).   . Multiple Vitamins-Minerals (EMERGEN-C IMMUNE PO) Take 1 capsule by mouth daily. CHEW  . nitroGLYCERIN (NITROSTAT) 0.4 MG SL tablet  Place 0.4 mg under the tongue every 5 (five) minutes as needed for chest pain.   . pramipexole (MIRAPEX) 1 MG tablet Take 1 mg by mouth 2 (two) times daily.  Marland Kitchen topiramate (TOPAMAX) 100 MG tablet Take 300 mg by mouth at bedtime.  . traMADol (ULTRAM) 50 MG tablet Take 50 mg by mouth at bedtime as needed (for pain).  . traZODone (DESYREL) 50 MG tablet Take 50 mg by mouth at bedtime.   . TRULICITY 1.5 EV/0.3JK SOPN Inject 1.5 mg into the skin every 7 (seven) days.  Marland Kitchen umeclidinium-vilanterol (ANORO ELLIPTA) 62.5-25 MCG/INH AEPB Inhale 1 puff into the lungs daily.     Allergies:   Hydroxychloroquine; Donepezil; Prednisone;  Bupropion; Contrast media [iodinated diagnostic agents]; Metrizamide; Varenicline; and Tape   Social History   Social History  . Marital status: Married    Spouse name: N/A  . Number of children: N/A  . Years of education: N/A   Social History Main Topics  . Smoking status: Current Every Day Smoker    Types: Cigarettes  . Smokeless tobacco: Never Used  . Alcohol use Yes  . Drug use: No  . Sexual activity: Not Asked   Other Topics Concern  . None   Social History Narrative  . None     Family History: The patient's family history includes Diabetes in her father, sister, sister, and son; Stroke in her mother. ROS:   Please see the history of present illness.    All 14 point review of systems negative except as described per history of present illness  EKGs/Labs/Other Studies Reviewed:      Recent Labs: 10/05/2016: NT-Pro BNP 833 10/12/2016: B Natriuretic Peptide 49.9 10/13/2016: Hemoglobin 12.9; Platelets 204 10/14/2016: BUN 21; Creatinine, Ser 1.18; Potassium 4.0; Sodium 140  Recent Lipid Panel    Component Value Date/Time   CHOL 122 10/13/2016 0037   TRIG 68 10/13/2016 0037   HDL 34 (L) 10/13/2016 0037   CHOLHDL 3.6 10/13/2016 0037   VLDL 14 10/13/2016 0037   LDLCALC 74 10/13/2016 0037    Physical Exam:    VS:  BP 120/66   Pulse 72   Resp 12   Ht 5\' 2"  (1.575 m)   Wt 195 lb (88.5 kg)   BMI 35.67 kg/m     Wt Readings from Last 3 Encounters:  10/20/16 195 lb (88.5 kg)  10/13/16 191 lb 12.8 oz (87 kg)  10/05/16 198 lb 12.8 oz (90.2 kg)     GEN:  Well nourished, well developed in no acute distress HEENT: Normal NECK: No JVD; No carotid bruits LYMPHATICS: No lymphadenopathy CARDIAC: RRR, no murmurs, no rubs, no gallops RESPIRATORY:  Clear to auscultation without rales, wheezing or rhonchi  ABDOMEN: Soft, non-tender, non-distended MUSCULOSKELETAL:  No edema; No deformity  SKIN: Warm and dry LOWER EXTREMITIES: no swelling NEUROLOGIC:  Alert and  oriented x 3 PSYCHIATRIC:  Normal affect   ASSESSMENT:    1. Ischemic cardiomyopathy   2. Chronic combined systolic and diastolic congestive heart failure ([place])   3. Essential hypertension   4. Atherosclerosis of native coronary artery of native heart without angina pectoris    PLAN:    In order of problems listed above:  1. Ischemic heart myopathy: On beta blocker asked her to come back to [place]. 2. Chronic systolic and diastolic congestive heart pressure appears to be compensated. 3. Essential hypertension: On appropriate medication left we will control at target we'll continue. 4. Coronary artery disease status post recent cardiac  catheterization. Catheterization has been reviewed. Nonobstructive lesions. 5. Pre-op evaluation for back surgery. She is stable from cardiac standpoint reviewed proceed with surgery. We need to pay special attention to fluid status because of her cardiomyopathy.   Medication Adjustments/Labs and Tests Ordered: Current medicines are reviewed at length with the patient today.  Concerns regarding medicines are outlined above.  No orders of the defined types were placed in this encounter.  Medication changes: No orders of the defined types were placed in this encounter.   Signed, Park Liter, MD, [facility] 10/20/2016 8:55 AM    [place]

## 2016-10-20 NOTE — Addendum Note (Signed)
Addended by: Warner Mccreedy E on: 10/20/2016 09:04 AM   Modules accepted: Orders

## 2016-10-20 NOTE — Patient Instructions (Signed)
Medication Instructions:  Your physician has recommended you make the following change in your medication:  START sacubitril-valsartan (Entresto) 24-26 mg two times daily   Labwork: None  Testing/Procedures: None  Follow-Up: Your physician recommends that you schedule a follow-up appointment in: 6 weeks.   Any Other Special Instructions Will Be Listed Below (If Applicable).     If you need a refill on your cardiac medications before your next appointment, please call your pharmacy.

## 2016-10-21 DIAGNOSIS — M6281 Muscle weakness (generalized): Secondary | ICD-10-CM | POA: Diagnosis not present

## 2016-10-21 DIAGNOSIS — M79631 Pain in right forearm: Secondary | ICD-10-CM | POA: Diagnosis not present

## 2016-10-21 DIAGNOSIS — M79641 Pain in right hand: Secondary | ICD-10-CM | POA: Diagnosis not present

## 2016-10-24 DIAGNOSIS — J449 Chronic obstructive pulmonary disease, unspecified: Secondary | ICD-10-CM | POA: Diagnosis not present

## 2016-10-25 ENCOUNTER — Telehealth: Payer: Self-pay

## 2016-10-25 DIAGNOSIS — M79606 Pain in leg, unspecified: Secondary | ICD-10-CM | POA: Diagnosis not present

## 2016-10-25 DIAGNOSIS — M542 Cervicalgia: Secondary | ICD-10-CM | POA: Diagnosis not present

## 2016-10-25 DIAGNOSIS — Z6836 Body mass index (BMI) 36.0-36.9, adult: Secondary | ICD-10-CM | POA: Diagnosis not present

## 2016-10-25 NOTE — Telephone Encounter (Signed)
P/c with Ivin Booty , needs you to fax ov note and cath records to [phone] att Ivin Booty as well as surgery clearance [email or web address]

## 2016-10-25 NOTE — Telephone Encounter (Signed)
Fax sent.

## 2016-10-26 DIAGNOSIS — M6281 Muscle weakness (generalized): Secondary | ICD-10-CM | POA: Diagnosis not present

## 2016-10-26 DIAGNOSIS — M79641 Pain in right hand: Secondary | ICD-10-CM | POA: Diagnosis not present

## 2016-10-26 DIAGNOSIS — M79631 Pain in right forearm: Secondary | ICD-10-CM | POA: Diagnosis not present

## 2016-11-01 DIAGNOSIS — M6281 Muscle weakness (generalized): Secondary | ICD-10-CM | POA: Diagnosis not present

## 2016-11-01 DIAGNOSIS — M79641 Pain in right hand: Secondary | ICD-10-CM | POA: Diagnosis not present

## 2016-11-01 DIAGNOSIS — M79631 Pain in right forearm: Secondary | ICD-10-CM | POA: Diagnosis not present

## 2016-11-03 DIAGNOSIS — R221 Localized swelling, mass and lump, neck: Secondary | ICD-10-CM | POA: Diagnosis not present

## 2016-11-03 DIAGNOSIS — M542 Cervicalgia: Secondary | ICD-10-CM | POA: Diagnosis not present

## 2016-11-04 DIAGNOSIS — J310 Chronic rhinitis: Secondary | ICD-10-CM | POA: Diagnosis not present

## 2016-11-07 DIAGNOSIS — I130 Hypertensive heart and chronic kidney disease with heart failure and stage 1 through stage 4 chronic kidney disease, or unspecified chronic kidney disease: Secondary | ICD-10-CM | POA: Diagnosis not present

## 2016-11-07 DIAGNOSIS — I252 Old myocardial infarction: Secondary | ICD-10-CM | POA: Diagnosis not present

## 2016-11-07 DIAGNOSIS — J449 Chronic obstructive pulmonary disease, unspecified: Secondary | ICD-10-CM | POA: Diagnosis not present

## 2016-11-07 DIAGNOSIS — Z9981 Dependence on supplemental oxygen: Secondary | ICD-10-CM | POA: Diagnosis not present

## 2016-11-07 DIAGNOSIS — I509 Heart failure, unspecified: Secondary | ICD-10-CM | POA: Diagnosis not present

## 2016-11-07 DIAGNOSIS — E2740 Unspecified adrenocortical insufficiency: Secondary | ICD-10-CM | POA: Diagnosis not present

## 2016-11-07 DIAGNOSIS — F17210 Nicotine dependence, cigarettes, uncomplicated: Secondary | ICD-10-CM | POA: Diagnosis not present

## 2016-11-07 DIAGNOSIS — G4733 Obstructive sleep apnea (adult) (pediatric): Secondary | ICD-10-CM | POA: Diagnosis not present

## 2016-11-07 DIAGNOSIS — Z7902 Long term (current) use of antithrombotics/antiplatelets: Secondary | ICD-10-CM | POA: Diagnosis not present

## 2016-11-07 DIAGNOSIS — I472 Ventricular tachycardia: Secondary | ICD-10-CM | POA: Diagnosis not present

## 2016-11-07 DIAGNOSIS — M50123 Cervical disc disorder at C6-C7 level with radiculopathy: Secondary | ICD-10-CM | POA: Diagnosis not present

## 2016-11-07 DIAGNOSIS — M50122 Cervical disc disorder at C5-C6 level with radiculopathy: Secondary | ICD-10-CM | POA: Diagnosis not present

## 2016-11-07 DIAGNOSIS — J9610 Chronic respiratory failure, unspecified whether with hypoxia or hypercapnia: Secondary | ICD-10-CM | POA: Diagnosis not present

## 2016-11-07 DIAGNOSIS — N189 Chronic kidney disease, unspecified: Secondary | ICD-10-CM | POA: Diagnosis not present

## 2016-11-07 DIAGNOSIS — Z794 Long term (current) use of insulin: Secondary | ICD-10-CM | POA: Diagnosis not present

## 2016-11-07 DIAGNOSIS — E1122 Type 2 diabetes mellitus with diabetic chronic kidney disease: Secondary | ICD-10-CM | POA: Diagnosis not present

## 2016-11-07 DIAGNOSIS — E039 Hypothyroidism, unspecified: Secondary | ICD-10-CM | POA: Diagnosis not present

## 2016-11-07 DIAGNOSIS — M542 Cervicalgia: Secondary | ICD-10-CM | POA: Diagnosis not present

## 2016-11-07 DIAGNOSIS — I2510 Atherosclerotic heart disease of native coronary artery without angina pectoris: Secondary | ICD-10-CM | POA: Diagnosis not present

## 2016-11-08 DIAGNOSIS — I509 Heart failure, unspecified: Secondary | ICD-10-CM | POA: Diagnosis not present

## 2016-11-08 DIAGNOSIS — G4733 Obstructive sleep apnea (adult) (pediatric): Secondary | ICD-10-CM | POA: Diagnosis not present

## 2016-11-08 DIAGNOSIS — I252 Old myocardial infarction: Secondary | ICD-10-CM | POA: Diagnosis not present

## 2016-11-08 DIAGNOSIS — E1122 Type 2 diabetes mellitus with diabetic chronic kidney disease: Secondary | ICD-10-CM | POA: Diagnosis not present

## 2016-11-08 DIAGNOSIS — E039 Hypothyroidism, unspecified: Secondary | ICD-10-CM | POA: Diagnosis not present

## 2016-11-08 DIAGNOSIS — Z9981 Dependence on supplemental oxygen: Secondary | ICD-10-CM | POA: Diagnosis not present

## 2016-11-08 DIAGNOSIS — Z7902 Long term (current) use of antithrombotics/antiplatelets: Secondary | ICD-10-CM | POA: Diagnosis not present

## 2016-11-08 DIAGNOSIS — N189 Chronic kidney disease, unspecified: Secondary | ICD-10-CM | POA: Diagnosis not present

## 2016-11-08 DIAGNOSIS — J449 Chronic obstructive pulmonary disease, unspecified: Secondary | ICD-10-CM | POA: Diagnosis not present

## 2016-11-08 DIAGNOSIS — E2740 Unspecified adrenocortical insufficiency: Secondary | ICD-10-CM | POA: Diagnosis not present

## 2016-11-08 DIAGNOSIS — M50122 Cervical disc disorder at C5-C6 level with radiculopathy: Secondary | ICD-10-CM | POA: Diagnosis not present

## 2016-11-08 DIAGNOSIS — I2510 Atherosclerotic heart disease of native coronary artery without angina pectoris: Secondary | ICD-10-CM | POA: Diagnosis not present

## 2016-11-08 DIAGNOSIS — I130 Hypertensive heart and chronic kidney disease with heart failure and stage 1 through stage 4 chronic kidney disease, or unspecified chronic kidney disease: Secondary | ICD-10-CM | POA: Diagnosis not present

## 2016-11-08 DIAGNOSIS — F17210 Nicotine dependence, cigarettes, uncomplicated: Secondary | ICD-10-CM | POA: Diagnosis not present

## 2016-11-08 DIAGNOSIS — M50123 Cervical disc disorder at C6-C7 level with radiculopathy: Secondary | ICD-10-CM | POA: Diagnosis not present

## 2016-11-08 DIAGNOSIS — Z794 Long term (current) use of insulin: Secondary | ICD-10-CM | POA: Diagnosis not present

## 2016-11-10 DIAGNOSIS — N939 Abnormal uterine and vaginal bleeding, unspecified: Secondary | ICD-10-CM | POA: Diagnosis not present

## 2016-11-15 DIAGNOSIS — Z7189 Other specified counseling: Secondary | ICD-10-CM | POA: Diagnosis not present

## 2016-11-15 DIAGNOSIS — M542 Cervicalgia: Secondary | ICD-10-CM | POA: Diagnosis not present

## 2016-11-15 DIAGNOSIS — G4700 Insomnia, unspecified: Secondary | ICD-10-CM | POA: Diagnosis not present

## 2016-11-15 DIAGNOSIS — G629 Polyneuropathy, unspecified: Secondary | ICD-10-CM | POA: Diagnosis not present

## 2016-11-18 DIAGNOSIS — L7634 Postprocedural seroma of skin and subcutaneous tissue following other procedure: Secondary | ICD-10-CM | POA: Diagnosis not present

## 2016-11-21 ENCOUNTER — Telehealth: Payer: Self-pay | Admitting: Cardiology

## 2016-11-21 NOTE — Telephone Encounter (Signed)
Wants Dr Agustin Cree to start her back on Imdur

## 2016-11-22 DIAGNOSIS — Z6838 Body mass index (BMI) 38.0-38.9, adult: Secondary | ICD-10-CM | POA: Diagnosis not present

## 2016-11-22 DIAGNOSIS — M4722 Other spondylosis with radiculopathy, cervical region: Secondary | ICD-10-CM | POA: Diagnosis not present

## 2016-11-22 MED ORDER — ISOSORBIDE MONONITRATE ER 60 MG PO TB24: 60.0000 mg | ORAL_TABLET | Freq: Every day | ORAL | 3 refills | Status: DC

## 2016-11-22 NOTE — Telephone Encounter (Signed)
Verbal from Dr. Agustin Cree. Pt can restart Imdur. Advised to taking 60 mg daily. Pt denies needing rx for medication as she has some left from previously being on it.

## 2016-11-22 NOTE — Addendum Note (Signed)
Addended by: Kathyrn Sheriff on: 11/22/2016 10:52 AM   Modules accepted: Orders

## 2016-11-23 DIAGNOSIS — G4733 Obstructive sleep apnea (adult) (pediatric): Secondary | ICD-10-CM | POA: Diagnosis not present

## 2016-11-23 DIAGNOSIS — J310 Chronic rhinitis: Secondary | ICD-10-CM | POA: Diagnosis not present

## 2016-11-23 DIAGNOSIS — R05 Cough: Secondary | ICD-10-CM | POA: Diagnosis not present

## 2016-11-23 DIAGNOSIS — J449 Chronic obstructive pulmonary disease, unspecified: Secondary | ICD-10-CM | POA: Diagnosis not present

## 2016-11-23 DIAGNOSIS — F17210 Nicotine dependence, cigarettes, uncomplicated: Secondary | ICD-10-CM | POA: Diagnosis not present

## 2016-12-01 ENCOUNTER — Ambulatory Visit (INDEPENDENT_AMBULATORY_CARE_PROVIDER_SITE_OTHER): Payer: Federal, State, Local not specified - PPO | Admitting: Cardiology

## 2016-12-01 ENCOUNTER — Encounter: Payer: Self-pay | Admitting: Cardiology

## 2016-12-01 VITALS — BP 120/70 | HR 84 | Resp 14 | Ht 62.0 in | Wt 200.8 lb

## 2016-12-01 DIAGNOSIS — I5022 Chronic systolic (congestive) heart failure: Secondary | ICD-10-CM

## 2016-12-01 DIAGNOSIS — I2510 Atherosclerotic heart disease of native coronary artery without angina pectoris: Secondary | ICD-10-CM | POA: Diagnosis not present

## 2016-12-01 DIAGNOSIS — J438 Other emphysema: Secondary | ICD-10-CM

## 2016-12-01 DIAGNOSIS — I10 Essential (primary) hypertension: Secondary | ICD-10-CM

## 2016-12-01 DIAGNOSIS — I5042 Chronic combined systolic (congestive) and diastolic (congestive) heart failure: Secondary | ICD-10-CM | POA: Diagnosis not present

## 2016-12-01 NOTE — Patient Instructions (Signed)
Medication Instructions:  Your physician recommends that you continue on your current medications as directed. Please refer to the Current Medication list given to you today.  Labwork: Your physician recommends that you return for lab work in: Today for kidney function check  Testing/Procedures: None   Follow-Up: Your physician recommends that you schedule a follow-up appointment in: 3 months   Any Other Special Instructions Will Be Listed Below (If Applicable).  Please note that any paperwork needing to be filled out by the provider will need to be addressed at the front desk prior to seeing the provider. Please note that any paperwork FMLA, Disability or other documents regarding health condition is subject to a $25.00 charge that must be received prior to completion of paperwork in the form of a money order or check.    If you need a refill on your cardiac medications before your next appointment, please call your pharmacy.

## 2016-12-01 NOTE — Progress Notes (Signed)
Cardiology Office Note:    Date:  12/01/2016   ID:  Crystal Yates, DOB 1955-05-09, MRN [id number]  PCP:  Dortha Kern, [place]  Cardiologist:  Jenne Campus, MD    Referring MD: Dortha Kern, [place]   Chief Complaint  Patient presents with  . 1 month follow up  Follow-up after neck surgery  History of Present Illness:    Crystal Yates is a 61 y.o. female  with coronary artery disease. She comes today to [place] for follow-up. She is doing better she did have neck surgery everything went well with no difficulties. No constipation of surgery. Denies having any chest pain tightness squeezing pressure burning chest, no shortness of breath more than usual  Past Medical History:  Diagnosis Date  . CAD in native artery    a. Prior LAD stenting based on cath. b. RCA stenting 03/2016 x2.  . Chronic combined systolic and diastolic CHF (congestive heart failure) ([place])   . CKD (chronic kidney disease), stage II   . COPD (chronic obstructive pulmonary disease) ([place])   . Diabetes mellitus without complication ([place])   . Hashimoto's thyroiditis   . Hyperlipidemia   . Hypertension   . Secondary adrenal insufficiency ([place])   . Thyroid disease   . Tobacco abuse     Past Surgical History:  Procedure Laterality Date  . ABDOMINAL SURGERY    . CESAREAN SECTION    . CHOLECYSTECTOMY    . COLON RESECTION    . HERNIA MESH REMOVAL    . HERNIA REPAIR    . LEFT HEART CATH AND CORONARY ANGIOGRAPHY N/A 10/13/2016   Procedure: Left Heart Cath and Coronary Angiography;  Surgeon: Nelva Bush, MD;  Location: [facility];  Service: Cardiovascular;  Laterality: N/A;  . SHOULDER ARTHROSCOPY    . TUBAL LIGATION      Current Medications: Current Meds  Medication Sig  . aspirin EC 81 MG tablet Take 81 mg by mouth daily.   Marland Kitchen atorvastatin (LIPITOR) 20 MG tablet Take 20 mg by mouth daily.  . benzonatate (TESSALON) 200 MG capsule Take 200 mg by mouth 3 (three) times daily as needed for cough.  .  bumetanide (BUMEX) 1 MG tablet Take 1 mg by mouth daily.  . carvedilol (COREG) 3.125 MG tablet Take 3.125 mg by mouth 2 (two) times daily with a meal.  . cephALEXin (KEFLEX) 500 MG capsule Take 500 mg by mouth 4 (four) times daily.  . Cholecalciferol (VITAMIN D3) 2000 units capsule Take 2,000 Units by mouth daily.   . clopidogrel (PLAVIX) 75 MG tablet Take 75 mg by mouth daily.  . fluticasone (FLONASE) 50 MCG/ACT nasal spray Place 2 sprays into both nostrils daily.  . hydrocortisone (CORTEF) 5 MG tablet Take 5-10 mg by mouth See admin instructions. 10 mg in the morning then 5 mg midday  . insulin degludec (TRESIBA FLEXTOUCH) 100 UNIT/ML SOPN FlexTouch Pen Inject 20 Units into the skin at bedtime.  . Iron-FA-B Cmp-C-Biot-Probiotic (FUSION PLUS) CAPS Take 1 capsule by mouth daily.  . isosorbide mononitrate (IMDUR) 60 MG 24 hr tablet Take 1 tablet (60 mg total) by mouth daily.  Marland Kitchen levothyroxine (SYNTHROID) 125 MCG tablet Take 125 mcg by mouth daily.   Marland Kitchen lidocaine (XYLOCAINE) 5 % ointment Apply 1 application topically as needed (for neck pain).   . Multiple Vitamins-Minerals (EMERGEN-C IMMUNE PO) Take 1 capsule by mouth daily. CHEW  . nitroGLYCERIN (NITROSTAT) 0.4 MG SL tablet Place 0.4 mg under the tongue every 5 (five) minutes as  needed for chest pain.   Marland Kitchen omeprazole (PRILOSEC) 40 MG capsule Take 40 mg by mouth daily.  . pramipexole (MIRAPEX) 1 MG tablet Take 1 mg by mouth 2 (two) times daily.  . sacubitril-valsartan (ENTRESTO) 24-26 MG Take 1 tablet by mouth 2 (two) times daily.  Marland Kitchen topiramate (TOPAMAX) 100 MG tablet Take 300 mg by mouth at bedtime.  . traMADol (ULTRAM) 50 MG tablet Take 50 mg by mouth at bedtime as needed (for pain).  . traZODone (DESYREL) 50 MG tablet Take 50 mg by mouth at bedtime.   . TRULICITY 1.5 XF/8.1WE SOPN Inject 1.5 mg into the skin every 7 (seven) days.  Marland Kitchen umeclidinium-vilanterol (ANORO ELLIPTA) 62.5-25 MCG/INH AEPB Inhale 1 puff into the lungs daily.     Allergies:    Hydroxychloroquine; Donepezil; Prednisone; Bupropion; Contrast media [iodinated diagnostic agents]; Metrizamide; Varenicline; and Tape   Social History   Social History  . Marital status: Married    Spouse name: N/A  . Number of children: N/A  . Years of education: N/A   Social History Main Topics  . Smoking status: Current Every Day Smoker    Types: Cigarettes  . Smokeless tobacco: Never Used  . Alcohol use Yes  . Drug use: No  . Sexual activity: Not Asked   Other Topics Concern  . None   Social History Narrative  . None     Family History: The patient's family history includes Diabetes in her father, sister, sister, and son; Stroke in her mother. ROS:   Please see the history of present illness.    All 14 point review of systems negative except as described per history of present illness  EKGs/Labs/Other Studies Reviewed:      Recent Labs: 10/05/2016: NT-Pro BNP 833 10/12/2016: B Natriuretic Peptide 49.9 10/13/2016: Hemoglobin 12.9; Platelets 204 10/14/2016: BUN 21; Creatinine, Ser 1.18; Potassium 4.0; Sodium 140  Recent Lipid Panel    Component Value Date/Time   CHOL 122 10/13/2016 0037   TRIG 68 10/13/2016 0037   HDL 34 (L) 10/13/2016 0037   CHOLHDL 3.6 10/13/2016 0037   VLDL 14 10/13/2016 0037   LDLCALC 74 10/13/2016 0037    Physical Exam:    VS:  BP 120/70   Pulse 84   Resp 14   Ht 5\' 2"  (1.575 m)   Wt 200 lb 12.8 oz (91.1 kg)   BMI 36.73 kg/m     Wt Readings from Last 3 Encounters:  12/01/16 200 lb 12.8 oz (91.1 kg)  10/20/16 195 lb (88.5 kg)  10/13/16 191 lb 12.8 oz (87 kg)     GEN:  Well nourished, well developed in no acute distress HEENT: Normal NECK: No JVD; No carotid bruits LYMPHATICS: No lymphadenopathy CARDIAC: RRR, no murmurs, no rubs, no gallops RESPIRATORY:  Clear to auscultation without rales, wheezing or rhonchi  ABDOMEN: Soft, non-tender, non-distended MUSCULOSKELETAL:  No edema; No deformity  SKIN: Warm and dry LOWER  EXTREMITIES: no swelling NEUROLOGIC:  Alert and oriented x 3 PSYCHIATRIC:  Normal affect   ASSESSMENT:    1. Chronic combined systolic and diastolic heart failure ([place])   2. Atherosclerosis of native coronary artery of native heart without angina pectoris   3. Chronic systolic heart failure ([place])   4. Essential hypertension   5. Chronic combined systolic and diastolic CHF (congestive heart failure) ([facility])   6. Other emphysema ([place])    PLAN:    In order of problems listed above:  1. Chronic systolic and diastolic congestive heart failure:  Stable on appropriate medications which I will continue. 2. Essential hypertension: Blood pressure seems to well control. 3. Emphysema: Sadly still continued to smoke we spent at least 5 minutes talking about the need to quit. He understands but honesty I don't think she'll be able to do it. 4. She was unable to start Entresto back, I will ask him to have Chem-7 done   Medication Adjustments/Labs and Tests Ordered: Current medicines are reviewed at length with the patient today.  Concerns regarding medicines are outlined above.  Orders Placed This Encounter  Procedures  . Basic metabolic panel   Medication changes: No orders of the defined types were placed in this encounter.   Signed, Park Liter, MD, [facility] 12/01/2016 11:59 AM    [place]

## 2016-12-02 DIAGNOSIS — E1165 Type 2 diabetes mellitus with hyperglycemia: Secondary | ICD-10-CM | POA: Diagnosis not present

## 2016-12-02 DIAGNOSIS — E785 Hyperlipidemia, unspecified: Secondary | ICD-10-CM | POA: Diagnosis not present

## 2016-12-02 DIAGNOSIS — I110 Hypertensive heart disease with heart failure: Secondary | ICD-10-CM | POA: Diagnosis not present

## 2016-12-02 LAB — BASIC METABOLIC PANEL
BUN/Creatinine Ratio: 22 (ref 12–28)
BUN: 23 mg/dL (ref 8–27)
CHLORIDE: 104 mmol/L (ref 96–106)
CO2: 23 mmol/L (ref 20–29)
Calcium: 9.4 mg/dL (ref 8.7–10.3)
Creatinine, Ser: 1.03 mg/dL — ABNORMAL HIGH (ref 0.57–1.00)
GFR calc Af Amer: 68 mL/min/{1.73_m2} (ref >59)
GFR calc non Af Amer: 59 mL/min/{1.73_m2} — ABNORMAL LOW (ref >59)
Glucose: 176 mg/dL — ABNORMAL HIGH (ref 65–99)
Potassium: 4.1 mmol/L (ref 3.5–5.2)
Sodium: 141 mmol/L (ref 134–144)

## 2016-12-16 DIAGNOSIS — J310 Chronic rhinitis: Secondary | ICD-10-CM | POA: Diagnosis not present

## 2016-12-19 DIAGNOSIS — M79641 Pain in right hand: Secondary | ICD-10-CM | POA: Diagnosis not present

## 2016-12-20 DIAGNOSIS — G4700 Insomnia, unspecified: Secondary | ICD-10-CM | POA: Diagnosis not present

## 2016-12-20 DIAGNOSIS — E785 Hyperlipidemia, unspecified: Secondary | ICD-10-CM | POA: Diagnosis not present

## 2016-12-20 DIAGNOSIS — I110 Hypertensive heart disease with heart failure: Secondary | ICD-10-CM | POA: Diagnosis not present

## 2016-12-20 DIAGNOSIS — Z139 Encounter for screening, unspecified: Secondary | ICD-10-CM | POA: Diagnosis not present

## 2016-12-20 DIAGNOSIS — E1165 Type 2 diabetes mellitus with hyperglycemia: Secondary | ICD-10-CM | POA: Diagnosis not present

## 2016-12-20 DIAGNOSIS — Z23 Encounter for immunization: Secondary | ICD-10-CM | POA: Diagnosis not present

## 2016-12-24 DIAGNOSIS — M79631 Pain in right forearm: Secondary | ICD-10-CM | POA: Diagnosis not present

## 2016-12-25 DIAGNOSIS — J449 Chronic obstructive pulmonary disease, unspecified: Secondary | ICD-10-CM | POA: Diagnosis not present

## 2016-12-27 DIAGNOSIS — M79631 Pain in right forearm: Secondary | ICD-10-CM | POA: Diagnosis not present

## 2016-12-27 DIAGNOSIS — M79641 Pain in right hand: Secondary | ICD-10-CM | POA: Diagnosis not present

## 2017-01-02 ENCOUNTER — Ambulatory Visit (INDEPENDENT_AMBULATORY_CARE_PROVIDER_SITE_OTHER): Payer: Federal, State, Local not specified - PPO | Admitting: Podiatry

## 2017-01-02 ENCOUNTER — Encounter: Payer: Self-pay | Admitting: Podiatry

## 2017-01-02 VITALS — BP 134/77 | HR 87

## 2017-01-02 DIAGNOSIS — D689 Coagulation defect, unspecified: Secondary | ICD-10-CM | POA: Diagnosis not present

## 2017-01-02 DIAGNOSIS — B351 Tinea unguium: Secondary | ICD-10-CM | POA: Diagnosis not present

## 2017-01-02 DIAGNOSIS — L600 Ingrowing nail: Secondary | ICD-10-CM | POA: Diagnosis not present

## 2017-01-02 MED ORDER — KETOCONAZOLE 2 % EX CREA: TOPICAL_CREAM | CUTANEOUS | 0 refills | Status: DC

## 2017-01-02 NOTE — Progress Notes (Signed)
Subjective:  Patient ID: Crystal Yates, female    DOB: 04-26-1955,  MRN: [id number]  Chief Complaint  Patient presents with  . Ingrown Toenail    possible ingrown toe nail on left 1st     61 y.o. female presents with the above complaint.  Reports possible ingrown nail left great toe that causes her pain. States the pain has been there for 2-3 months. Ports diabetes is well-controlled. Currently taking Plavix for anticoagulation.   Past Medical History:  Diagnosis Date  . CAD in native artery    a. Prior LAD stenting based on cath. b. RCA stenting 03/2016 x2.  . Chronic combined systolic and diastolic CHF (congestive heart failure) ([place])   . CKD (chronic kidney disease), stage II   . COPD (chronic obstructive pulmonary disease) ([place])   . Diabetes mellitus without complication ([place])   . Hashimoto's thyroiditis   . Hyperlipidemia   . Hypertension   . Secondary adrenal insufficiency ([place])   . Thyroid disease   . Tobacco abuse    Past Surgical History:  Procedure Laterality Date  . ABDOMINAL SURGERY    . CESAREAN SECTION    . CHOLECYSTECTOMY    . COLON RESECTION    . HERNIA MESH REMOVAL    . HERNIA REPAIR    . LEFT HEART CATH AND CORONARY ANGIOGRAPHY N/A 10/13/2016   Procedure: Left Heart Cath and Coronary Angiography;  Surgeon: Nelva Bush, MD;  Location: [facility];  Service: Cardiovascular;  Laterality: N/A;  . SHOULDER ARTHROSCOPY    . TUBAL LIGATION      Current Outpatient Prescriptions:  .  aspirin EC 81 MG tablet, Take 81 mg by mouth daily. , Disp: , Rfl:  .  atorvastatin (LIPITOR) 20 MG tablet, Take 20 mg by mouth daily., Disp: , Rfl:  .  benzonatate (TESSALON) 200 MG capsule, Take 200 mg by mouth 3 (three) times daily as needed for cough., Disp: , Rfl:  .  bumetanide (BUMEX) 1 MG tablet, Take 1 mg by mouth daily., Disp: , Rfl:  .  carvedilol (COREG) 3.125 MG tablet, Take 3.125 mg by mouth 2 (two) times daily with a meal., Disp: , Rfl:  .   Cholecalciferol (VITAMIN D3) 2000 units capsule, Take 2,000 Units by mouth daily. , Disp: , Rfl:  .  clopidogrel (PLAVIX) 75 MG tablet, Take 75 mg by mouth daily., Disp: , Rfl:  .  DULoxetine (CYMBALTA) 60 MG capsule, TK 1 C PO D, Disp: , Rfl: 0 .  fluticasone (FLONASE) 50 MCG/ACT nasal spray, Place 2 sprays into both nostrils daily., Disp: , Rfl:  .  hydrocortisone (CORTEF) 5 MG tablet, Take 5-10 mg by mouth See admin instructions. 10 mg in the morning then 5 mg midday, Disp: , Rfl:  .  insulin degludec (TRESIBA FLEXTOUCH) 100 UNIT/ML SOPN FlexTouch Pen, Inject 20 Units into the skin at bedtime., Disp: , Rfl:  .  Iron-FA-B Cmp-C-Biot-Probiotic (FUSION PLUS) CAPS, Take 1 capsule by mouth daily., Disp: , Rfl: 5 .  levothyroxine (SYNTHROID) 125 MCG tablet, Take 125 mcg by mouth daily. , Disp: , Rfl:  .  lidocaine (XYLOCAINE) 5 % ointment, Apply 1 application topically as needed (for neck pain). , Disp: , Rfl:  .  Multiple Vitamins-Minerals (EMERGEN-C IMMUNE PO), Take 1 capsule by mouth daily. CHEW, Disp: , Rfl:  .  nitroGLYCERIN (NITROSTAT) 0.4 MG SL tablet, Place 0.4 mg under the tongue every 5 (five) minutes as needed for chest pain. , Disp: , Rfl:  .  omeprazole (PRILOSEC) 40 MG capsule, Take 40 mg by mouth daily., Disp: , Rfl:  .  pramipexole (MIRAPEX) 1 MG tablet, Take 1 mg by mouth 2 (two) times daily., Disp: , Rfl:  .  sacubitril-valsartan (ENTRESTO) 24-26 MG, Take 1 tablet by mouth 2 (two) times daily., Disp: 60 tablet, Rfl: 11 .  topiramate (TOPAMAX) 100 MG tablet, Take 300 mg by mouth at bedtime., Disp: , Rfl:  .  topiramate (TOPAMAX) 50 MG tablet, TAKE 3 TABLETS BY MOUTH NIGHTLY, Disp: , Rfl: 3 .  traMADol (ULTRAM) 50 MG tablet, Take 50 mg by mouth at bedtime as needed (for pain)., Disp: , Rfl:  .  traZODone (DESYREL) 50 MG tablet, Take 50 mg by mouth at bedtime. , Disp: , Rfl:  .  TRULICITY 1.5 NO/6.7EH SOPN, Inject 1.5 mg into the skin every 7 (seven) days., Disp: , Rfl: 4 .   umeclidinium-vilanterol (ANORO ELLIPTA) 62.5-25 MCG/INH AEPB, Inhale 1 puff into the lungs daily., Disp: , Rfl:  .  cephALEXin (KEFLEX) 500 MG capsule, Take 500 mg by mouth 4 (four) times daily., Disp: , Rfl:  .  isosorbide mononitrate (IMDUR) 60 MG 24 hr tablet, Take 1 tablet (60 mg total) by mouth daily. (Patient not taking: Reported on 01/02/2017), Disp: 90 tablet, Rfl: 3 .  ketoconazole (NIZORAL) 2 % cream, Apply 1 fingertip amount to toenails daily, Disp: 30 g, Rfl: 0  Allergies  Allergen Reactions  . Hydroxychloroquine Shortness Of Breath, Nausea Only and Other (See Comments)    Dizziness (also)  . Donepezil Other (See Comments)    Dizziness, depression, and makes the patient feel "funny"  . Prednisone Other (See Comments)    Causes depression and suicidal thoughts  . Bupropion Other (See Comments)    Suicidal thoughts  . Contrast Media [Iodinated Diagnostic Agents]     "Blows the vein" and contrast gathers at the injected site's limb  . Metrizamide Other (See Comments)    (a non-ionic radiopaque contrast agent) "Blows the vein" and contrast gathers at the injected site's limb  . Varenicline Other (See Comments)    Suicidal thoughts  . Tape Rash    Paper tape is preferred, PLEASE   Review of Systems Objective:   Vitals:   01/02/17 0959  BP: 134/77  Pulse: 87   General AA&O x3. Normal mood and affect.  Vascular Dorsalis pedis and posterior tibial pulses  present 1+ bilaterally  Capillary refill normal to all digits. Pedal hair growth diminished.  Neurologic Epicritic sensation present bilaterally.  Dermatologic No open lesions. Interspaces clear of maceration.  Normal skin temperature and turgor. Hyperkeratotic lesions: none bilaterally. Nails: brittle, discoloration yellow, onychomycosis, thickening  Orthopedic: No history of amputation. MMT 5/5 in dorsiflexion, plantarflexion, inversion, and eversion. Normal lower extremity joint ROM without pain or crepitus.      Assessment & Plan:  Patient was evaluated and treated and all questions answered.  DM with Onychomycosis -Educated on etiology of nail fungus. -Nails debrided as below.  Procedure: Nail Debridement Rationale: Patient meets criteria for routine foot care due to coagulation defect. Type of Debridement: manual, sharp debridement. Instrumentation: Nail nipper, rotary burr. Number of Nails: 10  Return in about 3 months (around 04/04/2017).

## 2017-01-04 DIAGNOSIS — G5631 Lesion of radial nerve, right upper limb: Secondary | ICD-10-CM | POA: Diagnosis not present

## 2017-01-04 DIAGNOSIS — G5601 Carpal tunnel syndrome, right upper limb: Secondary | ICD-10-CM | POA: Diagnosis not present

## 2017-01-04 DIAGNOSIS — M1811 Unilateral primary osteoarthritis of first carpometacarpal joint, right hand: Secondary | ICD-10-CM | POA: Diagnosis not present

## 2017-01-04 DIAGNOSIS — M79641 Pain in right hand: Secondary | ICD-10-CM | POA: Diagnosis not present

## 2017-01-12 DIAGNOSIS — R05 Cough: Secondary | ICD-10-CM | POA: Diagnosis not present

## 2017-01-12 DIAGNOSIS — Z6839 Body mass index (BMI) 39.0-39.9, adult: Secondary | ICD-10-CM | POA: Diagnosis not present

## 2017-01-12 DIAGNOSIS — J00 Acute nasopharyngitis [common cold]: Secondary | ICD-10-CM | POA: Diagnosis not present

## 2017-01-18 DIAGNOSIS — T792XXD Traumatic secondary and recurrent hemorrhage and seroma, subsequent encounter: Secondary | ICD-10-CM | POA: Diagnosis not present

## 2017-01-18 DIAGNOSIS — T888XXD Other specified complications of surgical and medical care, not elsewhere classified, subsequent encounter: Secondary | ICD-10-CM | POA: Diagnosis not present

## 2017-01-19 DIAGNOSIS — G629 Polyneuropathy, unspecified: Secondary | ICD-10-CM | POA: Diagnosis not present

## 2017-01-19 DIAGNOSIS — Z6839 Body mass index (BMI) 39.0-39.9, adult: Secondary | ICD-10-CM | POA: Diagnosis not present

## 2017-01-19 DIAGNOSIS — M79672 Pain in left foot: Secondary | ICD-10-CM | POA: Diagnosis not present

## 2017-01-19 DIAGNOSIS — E1151 Type 2 diabetes mellitus with diabetic peripheral angiopathy without gangrene: Secondary | ICD-10-CM | POA: Diagnosis not present

## 2017-01-20 DIAGNOSIS — K439 Ventral hernia without obstruction or gangrene: Secondary | ICD-10-CM | POA: Diagnosis not present

## 2017-01-20 DIAGNOSIS — Y838 Other surgical procedures as the cause of abnormal reaction of the patient, or of later complication, without mention of misadventure at the time of the procedure: Secondary | ICD-10-CM | POA: Diagnosis not present

## 2017-01-20 DIAGNOSIS — T888XXA Other specified complications of surgical and medical care, not elsewhere classified, initial encounter: Secondary | ICD-10-CM | POA: Diagnosis not present

## 2017-01-24 DIAGNOSIS — I110 Hypertensive heart disease with heart failure: Secondary | ICD-10-CM | POA: Diagnosis not present

## 2017-01-24 DIAGNOSIS — E1151 Type 2 diabetes mellitus with diabetic peripheral angiopathy without gangrene: Secondary | ICD-10-CM | POA: Diagnosis not present

## 2017-01-24 DIAGNOSIS — J449 Chronic obstructive pulmonary disease, unspecified: Secondary | ICD-10-CM | POA: Diagnosis not present

## 2017-01-24 DIAGNOSIS — R05 Cough: Secondary | ICD-10-CM | POA: Diagnosis not present

## 2017-01-24 DIAGNOSIS — Z6841 Body Mass Index (BMI) 40.0 and over, adult: Secondary | ICD-10-CM | POA: Diagnosis not present

## 2017-01-25 ENCOUNTER — Emergency Department (HOSPITAL_COMMUNITY): Payer: Federal, State, Local not specified - PPO

## 2017-01-25 ENCOUNTER — Encounter (HOSPITAL_COMMUNITY): Payer: Self-pay

## 2017-01-25 ENCOUNTER — Telehealth: Payer: Self-pay | Admitting: Cardiology

## 2017-01-25 ENCOUNTER — Emergency Department (HOSPITAL_COMMUNITY)
Admission: EM | Admit: 2017-01-25 | Discharge: 2017-01-25 | Disposition: A | Discharge status: Home / Self Care | Payer: Federal, State, Local not specified - PPO | Attending: Emergency Medicine | Admitting: Emergency Medicine

## 2017-01-25 DIAGNOSIS — I110 Hypertensive heart disease with heart failure: Secondary | ICD-10-CM | POA: Diagnosis not present

## 2017-01-25 DIAGNOSIS — R9431 Abnormal electrocardiogram [ECG] [EKG]: Secondary | ICD-10-CM | POA: Diagnosis not present

## 2017-01-25 DIAGNOSIS — E1122 Type 2 diabetes mellitus with diabetic chronic kidney disease: Secondary | ICD-10-CM | POA: Diagnosis not present

## 2017-01-25 DIAGNOSIS — F17210 Nicotine dependence, cigarettes, uncomplicated: Secondary | ICD-10-CM | POA: Insufficient documentation

## 2017-01-25 DIAGNOSIS — N182 Chronic kidney disease, stage 2 (mild): Secondary | ICD-10-CM | POA: Diagnosis not present

## 2017-01-25 DIAGNOSIS — Z7982 Long term (current) use of aspirin: Secondary | ICD-10-CM | POA: Insufficient documentation

## 2017-01-25 DIAGNOSIS — J441 Chronic obstructive pulmonary disease with (acute) exacerbation: Secondary | ICD-10-CM | POA: Diagnosis not present

## 2017-01-25 DIAGNOSIS — R0602 Shortness of breath: Secondary | ICD-10-CM | POA: Diagnosis not present

## 2017-01-25 DIAGNOSIS — I2510 Atherosclerotic heart disease of native coronary artery without angina pectoris: Secondary | ICD-10-CM | POA: Diagnosis not present

## 2017-01-25 DIAGNOSIS — I5042 Chronic combined systolic (congestive) and diastolic (congestive) heart failure: Secondary | ICD-10-CM | POA: Insufficient documentation

## 2017-01-25 DIAGNOSIS — Z79899 Other long term (current) drug therapy: Secondary | ICD-10-CM | POA: Insufficient documentation

## 2017-01-25 DIAGNOSIS — Z7902 Long term (current) use of antithrombotics/antiplatelets: Secondary | ICD-10-CM | POA: Diagnosis not present

## 2017-01-25 DIAGNOSIS — E1151 Type 2 diabetes mellitus with diabetic peripheral angiopathy without gangrene: Secondary | ICD-10-CM | POA: Diagnosis not present

## 2017-01-25 DIAGNOSIS — R600 Localized edema: Secondary | ICD-10-CM | POA: Insufficient documentation

## 2017-01-25 DIAGNOSIS — R079 Chest pain, unspecified: Secondary | ICD-10-CM | POA: Diagnosis not present

## 2017-01-25 DIAGNOSIS — Z794 Long term (current) use of insulin: Secondary | ICD-10-CM | POA: Insufficient documentation

## 2017-01-25 DIAGNOSIS — I129 Hypertensive chronic kidney disease with stage 1 through stage 4 chronic kidney disease, or unspecified chronic kidney disease: Secondary | ICD-10-CM | POA: Insufficient documentation

## 2017-01-25 LAB — BASIC METABOLIC PANEL
Anion gap: 6 (ref 5–15)
BUN: 21 mg/dL — AB (ref 6–20)
CALCIUM: 9.4 mg/dL (ref 8.9–10.3)
CHLORIDE: 105 mmol/L (ref 101–111)
CO2: 27 mmol/L (ref 22–32)
CREATININE: 1.01 mg/dL — AB (ref 0.44–1.00)
GFR calc Af Amer: >60 mL/min (ref >60)
GFR calc non Af Amer: 59 mL/min — ABNORMAL LOW (ref >60)
GLUCOSE: 244 mg/dL — AB (ref 65–99)
Potassium: 3.8 mmol/L (ref 3.5–5.1)
Sodium: 138 mmol/L (ref 135–145)

## 2017-01-25 LAB — HEPATIC FUNCTION PANEL
ALBUMIN: 3.1 g/dL — AB (ref 3.5–5.0)
ALT: 15 U/L (ref 14–54)
AST: 18 U/L (ref 15–41)
Alkaline Phosphatase: 76 U/L (ref 38–126)
Bilirubin, Direct: 0.1 mg/dL (ref 0.1–0.5)
Indirect Bilirubin: 0.2 mg/dL — ABNORMAL LOW (ref 0.3–0.9)
TOTAL PROTEIN: 7 g/dL (ref 6.5–8.1)
Total Bilirubin: 0.3 mg/dL (ref 0.3–1.2)

## 2017-01-25 LAB — CBC
HCT: 42 % (ref 36.0–46.0)
Hemoglobin: 13.9 g/dL (ref 12.0–15.0)
MCH: 31.4 pg (ref 26.0–34.0)
MCHC: 33.1 g/dL (ref 30.0–36.0)
MCV: 95 fL (ref 78.0–100.0)
PLATELETS: 227 10*3/uL (ref 150–400)
RBC: 4.42 MIL/uL (ref 3.87–5.11)
RDW: 13.9 % (ref 11.5–15.5)
WBC: 11.7 10*3/uL — ABNORMAL HIGH (ref 4.0–10.5)

## 2017-01-25 LAB — I-STAT TROPONIN, ED: TROPONIN I, POC: 0.02 ng/mL (ref 0.00–0.08)

## 2017-01-25 LAB — BRAIN NATRIURETIC PEPTIDE: B NATRIURETIC PEPTIDE 5: 140.8 pg/mL — AB (ref 0.0–100.0)

## 2017-01-25 MED ORDER — AZITHROMYCIN 250 MG PO TABS
500.0000 mg | ORAL_TABLET | Freq: Once | ORAL | Status: AC
  Administered 2017-01-25: 500 mg via ORAL
  Filled 2017-01-25: qty 2

## 2017-01-25 MED ORDER — IPRATROPIUM-ALBUTEROL 0.5-2.5 (3) MG/3ML IN SOLN
3.0000 mL | Freq: Once | RESPIRATORY_TRACT | Status: AC
  Administered 2017-01-25: 3 mL via RESPIRATORY_TRACT
  Filled 2017-01-25: qty 3

## 2017-01-25 MED ORDER — METHYLPREDNISOLONE SODIUM SUCC 125 MG IJ SOLR
125.0000 mg | Freq: Once | INTRAMUSCULAR | Status: AC
  Administered 2017-01-25: 125 mg via INTRAVENOUS
  Filled 2017-01-25: qty 2

## 2017-01-25 MED ORDER — AZITHROMYCIN 250 MG PO TABS: 250.0000 mg | ORAL_TABLET | Freq: Every day | ORAL | 0 refills | Status: DC

## 2017-01-25 MED ORDER — IOPAMIDOL (ISOVUE-370) INJECTION 76%
INTRAVENOUS | Status: AC
  Administered 2017-01-25: 100 mL
  Filled 2017-01-25: qty 100

## 2017-01-25 MED ORDER — PREDNISONE 20 MG PO TABS: ORAL_TABLET | ORAL | 0 refills | Status: DC

## 2017-01-25 MED ORDER — PREDNISONE 20 MG PO TABS
20.0000 mg | ORAL_TABLET | Freq: Once | ORAL | Status: AC
  Administered 2017-01-25: 20 mg via ORAL
  Filled 2017-01-25: qty 1

## 2017-01-25 NOTE — ED Notes (Signed)
Lab to add on BNP and Hepatic panel

## 2017-01-25 NOTE — ED Provider Notes (Signed)
[facility] EMERGENCY DEPARTMENT Provider Note   CSN: [id number] Arrival date & time: 01/25/17  1322     History   Chief Complaint Chief Complaint  Patient presents with  . Shortness of Breath  . Chest Pain    HPI Crystal Yates is a 61 y.o. female.  HPI Patient reports he does have history of COPD and congestive heart failure.  She reports she has been getting increasingly short of breath over the past 3 days.  Today with any exertion she felt very short of breath.  She also notes that she is developed swelling in her legs.  It has been gradually increasing.  Reports first however is started with pain.  Left leg is more swollen than right.  No history of PE or DVT.  No fever.  Patient reports she is chronically had a cough since May.  She takes inhalers but has not been getting relief.  She reports that this shortness of breath seems different than her COPD.  Patient reports she had a CT abdomen done 4 days ago at [facility].  That was part of evaluation for her ongoing problem with her right lower quadrant pain that is been chronic.  She reports a incidentally noted a bone lesion in the left iliac crest. Past Medical History:  Diagnosis Date  . CAD in native artery    a. Prior LAD stenting based on cath. b. RCA stenting 03/2016 x2.  . Chronic combined systolic and diastolic CHF (congestive heart failure) ([place])   . CKD (chronic kidney disease), stage II   . COPD (chronic obstructive pulmonary disease) ([place])   . Diabetes mellitus without complication ([place])   . Hashimoto's thyroiditis   . Hyperlipidemia   . Hypertension   . Secondary adrenal insufficiency ([place])   . Thyroid disease   . Tobacco abuse     Patient Active Problem List   Diagnosis Date Noted  . COPD exacerbation ([place]) 10/14/2016  . Chronic combined systolic and diastolic CHF (congestive heart failure) ([place]) 10/13/2016  . Chest tightness or pressure 10/12/2016  . Dyspnea on exertion  09/29/2016  . History of coronary artery disease 09/29/2016  . OAB (overactive bladder) 09/21/2016  . Flank pain 08/31/2016  . Oxygen dependent 06/20/2016  . Neuralgia of right upper extremity 04/26/2016  . Coronary artery disease 04/16/2016  . Lymphocele after surgical procedure 04/16/2016  . Ventricular tachycardia (paroxysmal) ([place]) 04/09/2016  . Chronic systolic heart failure ([place]) 04/08/2016  . Ischemic cardiomyopathy 04/08/2016  . Type 2 diabetes mellitus without complication, with long-term current use of insulin ([place]) 04/08/2016  . Anemia, blood loss 09/16/2015  . GI bleeding 09/16/2015  . Precordial pain 09/14/2015  . Chest pain 05/22/2015  . Hypokalemia 05/22/2015  . Hypothyroid 05/22/2015  . Status post hernia repair 01/29/2015  . Surgical wound breakdown 01/29/2015  . Hernia with strangulation 01/18/2015  . Hyperkalemia 01/18/2015  . Tobacco abuse 01/18/2015  . Recurrent incisional hernia with incarceration 01/15/2015  . Sleep apnea 01/15/2015  . S/P drug eluting coronary stent placement 10/16/2014  . Adrenal insufficiency ([place]) 11/21/2013  . Anemia 11/21/2013  . Unstable angina ([place]) 11/21/2013  . Atherosclerotic heart disease of native coronary artery without angina pectoris 11/21/2013  . Cardiomyopathy ([place]) 11/21/2013  . Congestive heart failure ([place]) 11/21/2013  . Diabetes mellitus with no complication ([place]) 09/32/3557  . Edema 11/21/2013  . Empty sella syndrome ([place]) 11/21/2013  . Hepatitis 11/21/2013  . Hyperlipemia 11/21/2013  . Hypertension 11/21/2013  . Kidney cyst,  acquired 11/21/2013  . Lymph nodes enlarged 11/21/2013  . Fat necrosis 11/21/2013  . Other emphysema ([place]) 11/21/2013  . Renal disorder 11/21/2013  . Shortness of breath 11/21/2013  . Sjogrens syndrome ([place]) 11/21/2013  . Thyroid disorder 11/21/2013  . Displacement of lumbar intervertebral disc 06/14/2013  . Intractable migraine without aura 06/14/2013  . Mild cognitive impairment  06/14/2013  . Polyneuropathy in diabetes ([place]) 06/14/2013  . Chronic adrenal insufficiency ([place]) 04/12/2011  . DDD (degenerative disc disease), lumbosacral 01/11/2008  . Thoracic or lumbosacral neuritis or radiculitis 01/11/2008    Past Surgical History:  Procedure Laterality Date  . ABDOMINAL SURGERY    . CESAREAN SECTION    . CHOLECYSTECTOMY    . COLON RESECTION    . HERNIA MESH REMOVAL    . HERNIA REPAIR    . LEFT HEART CATH AND CORONARY ANGIOGRAPHY N/A 10/13/2016   Procedure: Left Heart Cath and Coronary Angiography;  Surgeon: Nelva Bush, MD;  Location: [facility];  Service: Cardiovascular;  Laterality: N/A;  . SHOULDER ARTHROSCOPY    . TUBAL LIGATION      OB History    No data available       Home Medications    Prior to Admission medications   Medication Sig Start Date End Date Taking? Authorizing Provider  aspirin EC 81 MG tablet Take 81 mg by mouth daily.  04/13/16 06/07/17  [provider]  atorvastatin (LIPITOR) 20 MG tablet Take 20 mg by mouth daily. 12/12/14   [provider]  azithromycin (ZITHROMAX) 250 MG tablet Take 1 tablet (250 mg total) by mouth daily. 01/25/17   Charlesetta Shanks, MD  benzonatate (TESSALON) 200 MG capsule Take 200 mg by mouth 3 (three) times daily as needed for cough.    [provider]  bumetanide (BUMEX) 1 MG tablet Take 1 mg by mouth daily.    [provider]  carvedilol (COREG) 3.125 MG tablet Take 3.125 mg by mouth 2 (two) times daily with a meal.    [provider]  cephALEXin (KEFLEX) 500 MG capsule Take 500 mg by mouth 4 (four) times daily.    [provider]  Cholecalciferol (VITAMIN D3) 2000 units capsule Take 2,000 Units by mouth daily.     [provider]  clopidogrel (PLAVIX) 75 MG tablet Take 75 mg by mouth daily.    [provider]  DULoxetine (CYMBALTA) 60 MG capsule TK 1 C PO D 12/05/16   [provider]  fluticasone (FLONASE) 50  MCG/ACT nasal spray Place 2 sprays into both nostrils daily.    [provider]  hydrocortisone (CORTEF) 5 MG tablet Take 5-10 mg by mouth See admin instructions. 10 mg in the morning then 5 mg midday    [provider]  insulin degludec (TRESIBA FLEXTOUCH) 100 UNIT/ML SOPN FlexTouch Pen Inject 20 Units into the skin at bedtime. 08/28/16   [provider]  Iron-FA-B Cmp-C-Biot-Probiotic (FUSION PLUS) CAPS Take 1 capsule by mouth daily. 10/09/16   [provider]  isosorbide mononitrate (IMDUR) 60 MG 24 hr tablet Take 1 tablet (60 mg total) by mouth daily. Patient not taking: Reported on 01/02/2017 11/22/16 02/20/17  Park Liter, MD  ketoconazole (NIZORAL) 2 % cream Apply 1 fingertip amount to toenails daily 01/02/17   Evelina Bucy, DPM  levothyroxine (SYNTHROID) 125 MCG tablet Take 125 mcg by mouth daily.  10/29/15   [provider]  lidocaine (XYLOCAINE) 5 % ointment Apply 1 application topically as needed (  for neck pain).  06/27/16 06/27/17  [provider]  Multiple Vitamins-Minerals (EMERGEN-C IMMUNE PO) Take 1 capsule by mouth daily. CHEW    [provider]  nitroGLYCERIN (NITROSTAT) 0.4 MG SL tablet Place 0.4 mg under the tongue every 5 (five) minutes as needed for chest pain.     [provider]  omeprazole (PRILOSEC) 40 MG capsule Take 40 mg by mouth daily.    [provider]  pramipexole (MIRAPEX) 1 MG tablet Take 1 mg by mouth 2 (two) times daily.    [provider]  predniSONE (DELTASONE) 20 MG tablet 2 tabs po daily x 4 days 01/25/17   Charlesetta Shanks, MD  sacubitril-valsartan (ENTRESTO) 24-26 MG Take 1 tablet by mouth 2 (two) times daily. 10/20/16   Park Liter, MD  topiramate (TOPAMAX) 100 MG tablet Take 300 mg by mouth at bedtime.    [provider]  topiramate (TOPAMAX) 50 MG tablet TAKE 3 TABLETS BY MOUTH NIGHTLY 12/03/16   [provider]  traMADol (ULTRAM) 50 MG tablet  Take 50 mg by mouth at bedtime as needed (for pain).    [provider]  traZODone (DESYREL) 50 MG tablet Take 50 mg by mouth at bedtime.  01/11/16   [provider]  TRULICITY 1.5 EL/3.8BO SOPN Inject 1.5 mg into the skin every 7 (seven) days. 10/10/16   [provider]  umeclidinium-vilanterol (ANORO ELLIPTA) 62.5-25 MCG/INH AEPB Inhale 1 puff into the lungs daily.    [provider]    Family History Family History  Problem Relation Age of Onset  . Stroke Mother   . Diabetes Father   . Diabetes Sister   . Diabetes Sister   . Diabetes Son     Social History Social History  Substance Use Topics  . Smoking status: Current Every Day Smoker    Types: Cigarettes  . Smokeless tobacco: Never Used  . Alcohol use Yes     Allergies   Hydroxychloroquine; Donepezil; Prednisone; Bupropion; Metrizamide; Varenicline; and Tape   Review of Systems Review of Systems 10 Systems reviewed and are negative for acute change except as noted in the HPI.   Physical Exam Updated Vital Signs BP 140/87 (BP Location: Left Arm)   Pulse 92   Temp 97.8 F (36.6 C) (Oral)   Resp 16   SpO2 96%   Physical Exam  Constitutional: She is oriented to person, place, and time.  She is alert and nontoxic.  No respiratory distress at rest.  HENT:  Head: Normocephalic and atraumatic.  Nose: Nose normal.  Mouth/Throat: Oropharynx is clear and moist.  Eyes: Conjunctivae and EOM are normal.  Neck: Neck supple.  Cardiovascular: Normal rate, regular rhythm, normal heart sounds and intact distal pulses.   Pulmonary/Chest:  Expiratory wheeze bilaterally throughout lung fields.  Soft breath sounds at bases.  No rales or rhonchi  Abdominal: Soft. She exhibits no distension. There is tenderness. There is no guarding.  She reports she has some right lower quadrant tenderness.  This has been a chronic problem.  He has a mesh it is deteriorating.  No guarding.  Musculoskeletal:    2+ pitting edema right lower extremity.  3+ pitting edema left lower extremity.  Neurological: She is alert and oriented to person, place, and time. No cranial nerve deficit. She exhibits normal muscle tone. Coordination normal.  Skin: Skin is warm and dry.  Psychiatric: She has a normal mood and affect.     ED Treatments / Results  Labs (all labs ordered are listed, but only abnormal results are displayed) Labs Reviewed  BASIC METABOLIC PANEL - Abnormal; Notable for the following:       Result Value   Glucose, Bld 244 (*)    BUN 21 (*)    Creatinine, Ser 1.01 (*)    GFR calc non Af Amer 59 (*)    All other components within normal limits  CBC - Abnormal; Notable for the following:    WBC 11.7 (*)    All other components within normal limits  HEPATIC FUNCTION PANEL - Abnormal; Notable for the following:    Albumin 3.1 (*)    Indirect Bilirubin 0.2 (*)    All other components within normal limits  BRAIN NATRIURETIC PEPTIDE - Abnormal; Notable for the following:    B Natriuretic Peptide 140.8 (*)    All other components within normal limits  I-STAT TROPONIN, ED    EKG  EKG Interpretation  Date/Time:  Wednesday January 25 2017 13:33:44 EDT Ventricular Rate:  81 PR Interval:  140 QRS Duration: 96 QT Interval:  404 QTC Calculation: 469 R Axis:   69 Text Interpretation:  Normal sinus rhythm Nonspecific ST and T wave abnormality Abnormal ECG no acute ischemia, no change from previous Confirmed by Charlesetta Shanks [phone]) on 01/25/2017 6:43:17 PM       Radiology Dg Chest 2 View  Result Date: 01/25/2017 CLINICAL DATA:  Several days of worsening dyspnea on exertion and chest pain. History of CHF, recent episode of pneumonia. Current smoker. EXAM: CHEST  2 VIEW COMPARISON:  PA and lateral chest x-ray of October 14, 2016 FINDINGS: The lungs are mildly hyperinflated. The interstitial markings are minimally prominent but stable. There is no alveolar infiltrate. The heart and  pulmonary vascularity are normal. The mediastinum is normal in width. There is no pleural effusion. There is calcification in the wall of the aortic arch. The bony thorax exhibits no acute abnormality. IMPRESSION: COPD. No pneumonia, CHF, nor other acute cardiopulmonary abnormality. Thoracic aortic atherosclerosis. Electronically Signed   By: David  Martinique M.D.   On: 01/25/2017 14:04   Ct Angio Chest Pe W/cm &/or Wo Cm  Result Date: 01/25/2017 CLINICAL DATA:  61 year old female with chest pain.  Concern for PE. EXAM: CT ANGIOGRAPHY CHEST WITH CONTRAST TECHNIQUE: Multidetector CT imaging of the chest was performed using the standard protocol during bolus administration of intravenous contrast. Multiplanar CT image reconstructions and MIPs were obtained to evaluate the vascular anatomy. CONTRAST:  100 cc Isovue 370 COMPARISON:  Chest radiograph dated 01/25/2017 FINDINGS: Cardiovascular: There is mild cardiomegaly. No pericardial effusion. There is advanced multi vessel coronary vascular calcification with involvement of the left main, LAD, RCA, and left circumflex artery. There is moderate atherosclerotic calcification of the thoracic aorta. No aneurysmal dilatation or evidence of dissection. The origins of the great vessels of the aortic arch appear unremarkable as visualized. There is no CT evidence of pulmonary embolism. Mediastinum/Nodes: No hilar or mediastinal adenopathy. Esophagus is grossly unremarkable. There is postsurgical changes of right hemithyroidectomy with prominent left thyroid tissue. Ultrasound may provide better evaluation of the thyroid gland. This appearance is similar to the CT of 08/16/2016. Lungs/Pleura: There is mild centrilobular emphysema. There is a 3 mm right upper lobe pulmonary nodule (series 10, image 28). The lungs are clear. There is no pleural effusion or pneumothorax. Minimal thickening of the bronchial walls in the right lower lobe may represent mild bronchitis. Clinical  correlation is recommended. Upper Abdomen: Cholecystectomy. The visualized upper  abdomen is otherwise unremarkable. Musculoskeletal: Partially visualized lower cervical ACDF. No acute osseous pathology. Review of the MIP images confirms the above findings. IMPRESSION: 1. No CT evidence of pulmonary embolism. 2. Minimal thickened appearance of bronchial wall in the right lower lobe may represent mild bronchitis. Clinical correlation is recommended. No focal consolidation. Electronically Signed   By: Anner Crete M.D.   On: 01/25/2017 20:50    Procedures Procedures (including critical care time)  Medications Ordered in ED Medications  ipratropium-albuterol (DUONEB) 0.5-2.5 (3) MG/3ML nebulizer solution 3 mL (3 mLs Nebulization Given 01/25/17 1923)  methylPREDNISolone sodium succinate (SOLU-MEDROL) 125 mg/2 mL injection 125 mg (125 mg Intravenous Given 01/25/17 1919)  iopamidol (ISOVUE-370) 76 % injection (100 mLs  Contrast Given 01/25/17 2024)  azithromycin (ZITHROMAX) tablet 500 mg (500 mg Oral Given 01/25/17 2136)  predniSONE (DELTASONE) tablet 20 mg (20 mg Oral Given 01/25/17 2136)     Initial Impression / Assessment and Plan / ED Course  I have reviewed the triage vital signs and the nursing notes.  Pertinent labs & imaging results that were available during my care of the patient were reviewed by me and considered in my medical decision making (see chart for details).      Final Clinical Impressions(s) / ED Diagnoses   Final diagnoses:  COPD exacerbation ([place])  PE study is negative.  Also no signs of vascular congestion or consolidation on CT.  At this time, symptoms most suggestive of COPD exacerbation given negative CT findings.  Patient does have diffuse wheeze throughout the lung fields.  At this time will be treated for COPD exacerbation with Zithromax and a prednisone burst.  Return precautions reviewed.  New Prescriptions Discharge Medication List as of 01/25/2017  9:20  PM    START taking these medications   Details  azithromycin (ZITHROMAX) 250 MG tablet Take 1 tablet (250 mg total) by mouth daily., Starting Wed 01/25/2017, Print    predniSONE (DELTASONE) 20 MG tablet 2 tabs po daily x 4 days, Print         Charlesetta Shanks, MD 01/25/17 2326

## 2017-01-25 NOTE — Telephone Encounter (Signed)
Has been getting really short of breath

## 2017-01-25 NOTE — ED Triage Notes (Signed)
Pt presents for evaluation of sob and cp worsening over past few days. Pt reports hx of CHF and recent PNA. Pt reports dyspnea with exertion.

## 2017-01-25 NOTE — Telephone Encounter (Signed)
Pt presented to ED per Dr. Agustin Cree.

## 2017-01-25 NOTE — ED Notes (Signed)
Pt asked about contrast allergy that is listed, pt states she is NOT allergic, but that the contrast infiltrated her vein one time and swelled up her arm. Pt verbalized that she is OKAY with receiving IV contrast today. RN called CT to acknowledge and allergy removed from pt list

## 2017-01-31 DIAGNOSIS — R05 Cough: Secondary | ICD-10-CM | POA: Diagnosis not present

## 2017-01-31 DIAGNOSIS — M79609 Pain in unspecified limb: Secondary | ICD-10-CM | POA: Diagnosis not present

## 2017-01-31 DIAGNOSIS — Z6841 Body Mass Index (BMI) 40.0 and over, adult: Secondary | ICD-10-CM | POA: Diagnosis not present

## 2017-01-31 DIAGNOSIS — I110 Hypertensive heart disease with heart failure: Secondary | ICD-10-CM | POA: Diagnosis not present

## 2017-02-02 DIAGNOSIS — M79609 Pain in unspecified limb: Secondary | ICD-10-CM | POA: Diagnosis not present

## 2017-02-02 DIAGNOSIS — E1151 Type 2 diabetes mellitus with diabetic peripheral angiopathy without gangrene: Secondary | ICD-10-CM | POA: Diagnosis not present

## 2017-02-02 DIAGNOSIS — M79605 Pain in left leg: Secondary | ICD-10-CM | POA: Diagnosis not present

## 2017-02-02 DIAGNOSIS — M7989 Other specified soft tissue disorders: Secondary | ICD-10-CM | POA: Diagnosis not present

## 2017-02-03 DIAGNOSIS — T888XXD Other specified complications of surgical and medical care, not elsewhere classified, subsequent encounter: Secondary | ICD-10-CM | POA: Diagnosis not present

## 2017-02-03 DIAGNOSIS — T792XXD Traumatic secondary and recurrent hemorrhage and seroma, subsequent encounter: Secondary | ICD-10-CM | POA: Diagnosis not present

## 2017-02-03 DIAGNOSIS — L02211 Cutaneous abscess of abdominal wall: Secondary | ICD-10-CM | POA: Diagnosis not present

## 2017-02-06 DIAGNOSIS — G5631 Lesion of radial nerve, right upper limb: Secondary | ICD-10-CM | POA: Diagnosis not present

## 2017-02-07 DIAGNOSIS — I2510 Atherosclerotic heart disease of native coronary artery without angina pectoris: Secondary | ICD-10-CM | POA: Diagnosis not present

## 2017-02-07 DIAGNOSIS — G2581 Restless legs syndrome: Secondary | ICD-10-CM | POA: Diagnosis not present

## 2017-02-07 DIAGNOSIS — M542 Cervicalgia: Secondary | ICD-10-CM | POA: Diagnosis not present

## 2017-02-07 DIAGNOSIS — G43009 Migraine without aura, not intractable, without status migrainosus: Secondary | ICD-10-CM | POA: Diagnosis not present

## 2017-02-10 DIAGNOSIS — J310 Chronic rhinitis: Secondary | ICD-10-CM | POA: Diagnosis not present

## 2017-02-21 DIAGNOSIS — I110 Hypertensive heart disease with heart failure: Secondary | ICD-10-CM | POA: Diagnosis not present

## 2017-02-21 DIAGNOSIS — M79644 Pain in right finger(s): Secondary | ICD-10-CM | POA: Diagnosis not present

## 2017-02-21 DIAGNOSIS — Z6841 Body Mass Index (BMI) 40.0 and over, adult: Secondary | ICD-10-CM | POA: Diagnosis not present

## 2017-02-24 DIAGNOSIS — G4733 Obstructive sleep apnea (adult) (pediatric): Secondary | ICD-10-CM | POA: Diagnosis not present

## 2017-02-24 DIAGNOSIS — J449 Chronic obstructive pulmonary disease, unspecified: Secondary | ICD-10-CM | POA: Diagnosis not present

## 2017-02-27 DIAGNOSIS — E119 Type 2 diabetes mellitus without complications: Secondary | ICD-10-CM | POA: Diagnosis not present

## 2017-02-28 DIAGNOSIS — Z981 Arthrodesis status: Secondary | ICD-10-CM | POA: Diagnosis not present

## 2017-02-28 DIAGNOSIS — M4802 Spinal stenosis, cervical region: Secondary | ICD-10-CM | POA: Diagnosis not present

## 2017-03-01 DIAGNOSIS — K432 Incisional hernia without obstruction or gangrene: Secondary | ICD-10-CM | POA: Diagnosis not present

## 2017-03-02 ENCOUNTER — Ambulatory Visit (INDEPENDENT_AMBULATORY_CARE_PROVIDER_SITE_OTHER): Payer: Federal, State, Local not specified - PPO | Admitting: Cardiology

## 2017-03-02 ENCOUNTER — Encounter: Payer: Self-pay | Admitting: Cardiology

## 2017-03-02 VITALS — BP 110/60 | HR 93 | Ht 62.0 in | Wt 218.4 lb

## 2017-03-02 DIAGNOSIS — E119 Type 2 diabetes mellitus without complications: Secondary | ICD-10-CM

## 2017-03-02 DIAGNOSIS — J441 Chronic obstructive pulmonary disease with (acute) exacerbation: Secondary | ICD-10-CM

## 2017-03-02 DIAGNOSIS — Z794 Long term (current) use of insulin: Secondary | ICD-10-CM | POA: Diagnosis not present

## 2017-03-02 DIAGNOSIS — I2510 Atherosclerotic heart disease of native coronary artery without angina pectoris: Secondary | ICD-10-CM

## 2017-03-02 DIAGNOSIS — I255 Ischemic cardiomyopathy: Secondary | ICD-10-CM | POA: Diagnosis not present

## 2017-03-02 DIAGNOSIS — I5022 Chronic systolic (congestive) heart failure: Secondary | ICD-10-CM | POA: Diagnosis not present

## 2017-03-02 NOTE — Patient Instructions (Signed)
Medication Instructions:  Your physician recommends that you continue on your current medications as directed. Please refer to the Current Medication list given to you today.  Labwork: Your physician recommends that you have lab work today: BMP and BNP  Testing/Procedures: None ordered  Follow-Up: Your physician recommends that you schedule a follow-up appointment in: 1 month with Dr. Krasowski   Any Other Special Instructions Will Be Listed Below (If Applicable).     If you need a refill on your cardiac medications before your next appointment, please call your pharmacy.   

## 2017-03-02 NOTE — Progress Notes (Signed)
Cardiology Office Note:    Date:  03/02/2017   ID:  Crystal Yates, DOB Jul 16, 1955, MRN [id number]  PCP:  Dortha Kern, [place]  Cardiologist:  Jenne Campus, MD    Referring MD: Dortha Kern, [place]   Chief Complaint  Patient presents with  . Follow-up  Doing fair  History of Present Illness:    Crystal Yates is a 61 y.o. female with complex past medical history in January of this year she had cardiac catheterization which show nonobstructive lesion in her coronary arteries however she was found to have significant cardiomyopathy.  Manage appropriately with medications.  Recently she ended up going to the emergency room because of shortness of breath and cough a lot of tests were done including proBNP which was normal.  She was told to have exacerbation of COPD with bronchitis and appropriately treated with antibiotic and steroids.  Seems to be improving but still complains of having cough and swelling of lower extremities.  She was also given Zaroxolyn every day to improve diuresis however in the past and creatinine increased and the Zaroxolyn were cut down to every other day.  I will check a Chem-7 as well as proBNP today.  Denies having any chest pain tightness squeezing pressure burning chest.  Apparently some abdominal surgery again is contemplated.  From my standpoint of view she need to get better from congestive heart failure status before proceeding with surgery.  Therefore, I will see him back in my office in about 1 month.  Past Medical History:  Diagnosis Date  . CAD in native artery    a. Prior LAD stenting based on cath. b. RCA stenting 03/2016 x2.  . Chronic combined systolic and diastolic CHF (congestive heart failure) ([place])   . CKD (chronic kidney disease), stage II   . COPD (chronic obstructive pulmonary disease) ([place])   . Diabetes mellitus without complication ([place])   . Hashimoto's thyroiditis   . Hyperlipidemia   . Hypertension   . Secondary adrenal insufficiency  ([place])   . Thyroid disease   . Tobacco abuse     Past Surgical History:  Procedure Laterality Date  . ABDOMINAL SURGERY    . CESAREAN SECTION    . CHOLECYSTECTOMY    . COLON RESECTION    . HERNIA MESH REMOVAL    . HERNIA REPAIR    . LEFT HEART CATH AND CORONARY ANGIOGRAPHY N/A 10/13/2016   Procedure: Left Heart Cath and Coronary Angiography;  Surgeon: Nelva Bush, MD;  Location: [facility];  Service: Cardiovascular;  Laterality: N/A;  . SHOULDER ARTHROSCOPY    . TUBAL LIGATION      Current Medications: Current Meds  Medication Sig  . aspirin EC 81 MG tablet Take 81 mg by mouth daily.   Marland Kitchen atorvastatin (LIPITOR) 20 MG tablet Take 20 mg by mouth daily.  . benzonatate (TESSALON) 200 MG capsule Take 200 mg by mouth 3 (three) times daily as needed for cough.  . bumetanide (BUMEX) 1 MG tablet Take 1 mg by mouth daily.  . carvedilol (COREG) 3.125 MG tablet Take 3.125 mg by mouth 2 (two) times daily with a meal.  . Cholecalciferol (VITAMIN D3) 2000 units capsule Take 2,000 Units by mouth daily.   . clopidogrel (PLAVIX) 75 MG tablet Take 75 mg by mouth daily.  . DULoxetine (CYMBALTA) 60 MG capsule TK 1 C PO D  . fluticasone (FLONASE) 50 MCG/ACT nasal spray Place 2 sprays into both nostrils daily.  . hydrocortisone (CORTEF) 5 MG tablet  Take 5-10 mg by mouth See admin instructions. 10 mg in the morning then 5 mg midday  . insulin degludec (TRESIBA FLEXTOUCH) 100 UNIT/ML SOPN FlexTouch Pen Inject 20 Units into the skin at bedtime.  . Iron-FA-B Cmp-C-Biot-Probiotic (FUSION PLUS) CAPS Take 1 capsule by mouth daily.  Marland Kitchen ketoconazole (NIZORAL) 2 % cream Apply 1 fingertip amount to toenails daily  . levothyroxine (SYNTHROID) 125 MCG tablet Take 125 mcg by mouth daily.   Marland Kitchen lidocaine (XYLOCAINE) 5 % ointment Apply 1 application topically as needed (for neck pain).   . Multiple Vitamins-Minerals (EMERGEN-C IMMUNE PO) Take 1 capsule by mouth daily. CHEW  . nitroGLYCERIN (NITROSTAT) 0.4 MG  SL tablet Place 0.4 mg under the tongue every 5 (five) minutes as needed for chest pain.   Marland Kitchen omeprazole (PRILOSEC) 40 MG capsule Take 40 mg by mouth daily.  . pramipexole (MIRAPEX) 1 MG tablet Take 1 mg by mouth 2 (two) times daily.  . predniSONE (DELTASONE) 20 MG tablet 2 tabs po daily x 4 days  . sacubitril-valsartan (ENTRESTO) 24-26 MG Take 1 tablet by mouth 2 (two) times daily.  Marland Kitchen topiramate (TOPAMAX) 100 MG tablet Take 300 mg by mouth at bedtime.  . traZODone (DESYREL) 50 MG tablet Take 50 mg by mouth at bedtime.   . TRULICITY 1.5 XN/1.7GY SOPN Inject 1.5 mg into the skin every 7 (seven) days.  Marland Kitchen umeclidinium-vilanterol (ANORO ELLIPTA) 62.5-25 MCG/INH AEPB Inhale 1 puff into the lungs daily.     Allergies:   Hydroxychloroquine; Donepezil; Prednisone; Bupropion; Metrizamide; Varenicline; and Tape   Social History   Socioeconomic History  . Marital status: Married    Spouse name: None  . Number of children: None  . Years of education: None  . Highest education level: None  Social Needs  . Financial resource strain: None  . Food insecurity - worry: None  . Food insecurity - inability: None  . Transportation needs - medical: None  . Transportation needs - non-medical: None  Occupational History  . None  Tobacco Use  . Smoking status: Current Every Day Smoker    Types: Cigarettes  . Smokeless tobacco: Never Used  Substance and Sexual Activity  . Alcohol use: Yes  . Drug use: No  . Sexual activity: None  Other Topics Concern  . None  Social History Narrative  . None     Family History: The patient's family history includes Diabetes in her father, sister, sister, and son; Stroke in her mother. ROS:   Please see the history of present illness.    All 14 point review of systems negative except as described per history of present illness  EKGs/Labs/Other Studies Reviewed:      Recent Labs: 10/05/2016: NT-Pro BNP 833 01/25/2017: ALT 15; B Natriuretic Peptide 140.8; BUN  21; Creatinine, Ser 1.01; Hemoglobin 13.9; Platelets 227; Potassium 3.8; Sodium 138  Recent Lipid Panel    Component Value Date/Time   CHOL 122 10/13/2016 0037   TRIG 68 10/13/2016 0037   HDL 34 (L) 10/13/2016 0037   CHOLHDL 3.6 10/13/2016 0037   VLDL 14 10/13/2016 0037   LDLCALC 74 10/13/2016 0037    Physical Exam:    VS:  BP 110/60   Pulse 93   Ht 5\' 2"  (1.575 m)   Wt 218 lb 6.4 oz (99.1 kg)   SpO2 96%   BMI 39.95 kg/m     Wt Readings from Last 3 Encounters:  03/02/17 218 lb 6.4 oz (99.1 kg)  12/01/16 200 lb 12.8  oz (91.1 kg)  10/20/16 195 lb (88.5 kg)     GEN:  Well nourished, well developed in no acute distress HEENT: Normal NECK: No JVD; No carotid bruits LYMPHATICS: No lymphadenopathy CARDIAC: RRR, no murmurs, no rubs, no gallops RESPIRATORY:  Clear to auscultation without rales, wheezing or rhonchi  ABDOMEN: Soft, non-tender, non-distended MUSCULOSKELETAL:  No edema; No deformity  SKIN: Warm and dry LOWER EXTREMITIES: 2+ swelling NEUROLOGIC:  Alert and oriented x 3 PSYCHIATRIC:  Normal affect   ASSESSMENT:    1. Ischemic cardiomyopathy   2. Atherosclerosis of native coronary artery of native heart without angina pectoris   3. Chronic systolic heart failure ([place])   4. COPD exacerbation ([place])   5. Type 2 diabetes mellitus without complication, with long-term current use of insulin ([facility])    PLAN:    In order of problems listed above:  1. Ischemic cardia myopathy: Medications appropriate which I reviewed Chem-7 proBNP will be checked today 2. Atherosclerosis of coronary artery the only known luminal disease based on cardiac catheterization from a year ago. 3. COPD exacerbation appropriate medication which I will continue. 4. Type 2 diabetes: Apparently fairly controlled.   Medication Adjustments/Labs and Tests Ordered: Current medicines are reviewed at length with the patient today.  Concerns regarding medicines are outlined above.  No orders of the  defined types were placed in this encounter.  Medication changes: No orders of the defined types were placed in this encounter.   Signed, Park Liter, MD, [facility] 03/02/2017 10:04 AM    [place]

## 2017-03-03 DIAGNOSIS — E2740 Unspecified adrenocortical insufficiency: Secondary | ICD-10-CM | POA: Diagnosis not present

## 2017-03-03 DIAGNOSIS — I2510 Atherosclerotic heart disease of native coronary artery without angina pectoris: Secondary | ICD-10-CM | POA: Diagnosis not present

## 2017-03-03 DIAGNOSIS — Y838 Other surgical procedures as the cause of abnormal reaction of the patient, or of later complication, without mention of misadventure at the time of the procedure: Secondary | ICD-10-CM | POA: Diagnosis not present

## 2017-03-03 DIAGNOSIS — J449 Chronic obstructive pulmonary disease, unspecified: Secondary | ICD-10-CM | POA: Diagnosis not present

## 2017-03-03 DIAGNOSIS — T8189XA Other complications of procedures, not elsewhere classified, initial encounter: Secondary | ICD-10-CM | POA: Diagnosis not present

## 2017-03-03 DIAGNOSIS — G4730 Sleep apnea, unspecified: Secondary | ICD-10-CM | POA: Diagnosis not present

## 2017-03-03 DIAGNOSIS — E7800 Pure hypercholesterolemia, unspecified: Secondary | ICD-10-CM | POA: Diagnosis not present

## 2017-03-03 DIAGNOSIS — I10 Essential (primary) hypertension: Secondary | ICD-10-CM | POA: Diagnosis not present

## 2017-03-03 DIAGNOSIS — E063 Autoimmune thyroiditis: Secondary | ICD-10-CM | POA: Diagnosis not present

## 2017-03-03 DIAGNOSIS — F418 Other specified anxiety disorders: Secondary | ICD-10-CM | POA: Diagnosis not present

## 2017-03-03 DIAGNOSIS — F17200 Nicotine dependence, unspecified, uncomplicated: Secondary | ICD-10-CM | POA: Diagnosis not present

## 2017-03-03 DIAGNOSIS — S31109A Unspecified open wound of abdominal wall, unspecified quadrant without penetration into peritoneal cavity, initial encounter: Secondary | ICD-10-CM | POA: Diagnosis not present

## 2017-03-03 DIAGNOSIS — M1990 Unspecified osteoarthritis, unspecified site: Secondary | ICD-10-CM | POA: Diagnosis not present

## 2017-03-03 DIAGNOSIS — E119 Type 2 diabetes mellitus without complications: Secondary | ICD-10-CM | POA: Diagnosis not present

## 2017-03-03 LAB — BASIC METABOLIC PANEL
BUN/Creatinine Ratio: 20 (ref 12–28)
BUN: 20 mg/dL (ref 8–27)
CALCIUM: 9.2 mg/dL (ref 8.7–10.3)
CO2: 28 mmol/L (ref 20–29)
CREATININE: 1.01 mg/dL — AB (ref 0.57–1.00)
Chloride: 102 mmol/L (ref 96–106)
GFR, EST AFRICAN AMERICAN: 69 mL/min/{1.73_m2} (ref >59)
GFR, EST NON AFRICAN AMERICAN: 60 mL/min/{1.73_m2} (ref >59)
Glucose: 281 mg/dL — ABNORMAL HIGH (ref 65–99)
POTASSIUM: 4.4 mmol/L (ref 3.5–5.2)
Sodium: 139 mmol/L (ref 134–144)

## 2017-03-03 LAB — PRO B NATRIURETIC PEPTIDE: NT-Pro BNP: 615 pg/mL — ABNORMAL HIGH (ref 0–287)

## 2017-03-07 DIAGNOSIS — J449 Chronic obstructive pulmonary disease, unspecified: Secondary | ICD-10-CM | POA: Diagnosis not present

## 2017-03-07 DIAGNOSIS — G4733 Obstructive sleep apnea (adult) (pediatric): Secondary | ICD-10-CM | POA: Diagnosis not present

## 2017-03-07 DIAGNOSIS — F17210 Nicotine dependence, cigarettes, uncomplicated: Secondary | ICD-10-CM | POA: Diagnosis not present

## 2017-03-07 DIAGNOSIS — R911 Solitary pulmonary nodule: Secondary | ICD-10-CM | POA: Diagnosis not present

## 2017-03-08 DIAGNOSIS — R0600 Dyspnea, unspecified: Secondary | ICD-10-CM | POA: Diagnosis not present

## 2017-03-08 DIAGNOSIS — R0602 Shortness of breath: Secondary | ICD-10-CM | POA: Diagnosis not present

## 2017-03-08 DIAGNOSIS — J449 Chronic obstructive pulmonary disease, unspecified: Secondary | ICD-10-CM | POA: Diagnosis not present

## 2017-03-08 DIAGNOSIS — F17210 Nicotine dependence, cigarettes, uncomplicated: Secondary | ICD-10-CM | POA: Diagnosis not present

## 2017-03-08 DIAGNOSIS — R600 Localized edema: Secondary | ICD-10-CM | POA: Diagnosis not present

## 2017-03-09 DIAGNOSIS — I110 Hypertensive heart disease with heart failure: Secondary | ICD-10-CM | POA: Diagnosis not present

## 2017-03-09 DIAGNOSIS — E785 Hyperlipidemia, unspecified: Secondary | ICD-10-CM | POA: Diagnosis not present

## 2017-03-09 DIAGNOSIS — E1165 Type 2 diabetes mellitus with hyperglycemia: Secondary | ICD-10-CM | POA: Diagnosis not present

## 2017-03-10 ENCOUNTER — Other Ambulatory Visit: Payer: Self-pay | Admitting: Cardiology

## 2017-03-10 ENCOUNTER — Telehealth: Payer: Self-pay

## 2017-03-10 NOTE — Telephone Encounter (Signed)
Left message for pt advising her that per Dr. Agustin Cree after reviewing her ED visit records, she will need an appointment to see him. Advised her to call the office to schedule this appointment. We also need to clarify if she is or is not taking Imdur and if so which dosage.

## 2017-03-13 ENCOUNTER — Telehealth: Payer: Self-pay | Admitting: Cardiology

## 2017-03-13 NOTE — Telephone Encounter (Signed)
Patient states that you cancelled appt for surgery and she needs to know what to do?? Please call her !

## 2017-03-13 NOTE — Telephone Encounter (Signed)
Spoke with Dr. Agustin Cree, wants to see patient in appointment. Patient scheduled for tomorrow afternoon at 3:40 pm.

## 2017-03-14 ENCOUNTER — Ambulatory Visit (INDEPENDENT_AMBULATORY_CARE_PROVIDER_SITE_OTHER): Payer: Federal, State, Local not specified - PPO | Admitting: Cardiology

## 2017-03-14 ENCOUNTER — Encounter: Payer: Self-pay | Admitting: Cardiology

## 2017-03-14 VITALS — BP 102/70 | HR 81 | Ht 62.0 in | Wt 223.0 lb

## 2017-03-14 DIAGNOSIS — I255 Ischemic cardiomyopathy: Secondary | ICD-10-CM | POA: Diagnosis not present

## 2017-03-14 DIAGNOSIS — I110 Hypertensive heart disease with heart failure: Secondary | ICD-10-CM | POA: Diagnosis not present

## 2017-03-14 DIAGNOSIS — I5042 Chronic combined systolic (congestive) and diastolic (congestive) heart failure: Secondary | ICD-10-CM | POA: Diagnosis not present

## 2017-03-14 DIAGNOSIS — I10 Essential (primary) hypertension: Secondary | ICD-10-CM | POA: Diagnosis not present

## 2017-03-14 DIAGNOSIS — Z6841 Body Mass Index (BMI) 40.0 and over, adult: Secondary | ICD-10-CM | POA: Diagnosis not present

## 2017-03-14 MED ORDER — FUROSEMIDE 40 MG PO TABS: 40.0000 mg | ORAL_TABLET | Freq: Two times a day (BID) | ORAL | 3 refills | Status: DC

## 2017-03-14 MED ORDER — POTASSIUM CHLORIDE CRYS ER 20 MEQ PO TBCR: 20.0000 meq | EXTENDED_RELEASE_TABLET | Freq: Every day | ORAL | 3 refills | Status: DC

## 2017-03-14 NOTE — Addendum Note (Signed)
Addended by: Aleatha Borer on: 03/14/2017 04:12 PM   Modules accepted: Orders

## 2017-03-14 NOTE — Progress Notes (Signed)
Cardiology Office Note:    Date:  03/14/2017   ID:  Crystal Yates, DOB 1955/12/28, MRN [id number]  PCP:  Dortha Kern, [place]  Cardiologist:  Jenne Campus, MD    Referring MD: Dortha Kern, [place]   Chief Complaint  Patient presents with  . Edema  I am having shortness of breath  History of Present Illness:    Crystal Yates is a 61 y.o. female with cardiomyopathy: COPD: Congestive heart failure.  She comes to the office for follow-up she is supposed to have abdominal hernia surgery however when I seen her last time she was still in the compensated congestive heart failure short of breath.  I want her to get better before proceeding with surgery but she ended up going to the emergency room because of this problem.  Regardless, surgery has been canceled.  And she started feeling better but still does have some swelling of lower extremities as well as exertional shortness of breath.  Obviously her shortness of breath is multifactorial clearly advanced COPD play significant role here.  In my opinion now she is not ready to have surgery.  Past Medical History:  Diagnosis Date  . CAD in native artery    a. Prior LAD stenting based on cath. b. RCA stenting 03/2016 x2.  . Chronic combined systolic and diastolic CHF (congestive heart failure) ([place])   . CKD (chronic kidney disease), stage II   . COPD (chronic obstructive pulmonary disease) ([place])   . Diabetes mellitus without complication ([place])   . Hashimoto's thyroiditis   . Hyperlipidemia   . Hypertension   . Secondary adrenal insufficiency ([place])   . Thyroid disease   . Tobacco abuse     Past Surgical History:  Procedure Laterality Date  . ABDOMINAL SURGERY    . CESAREAN SECTION    . CHOLECYSTECTOMY    . COLON RESECTION    . HERNIA MESH REMOVAL    . HERNIA REPAIR    . LEFT HEART CATH AND CORONARY ANGIOGRAPHY N/A 10/13/2016   Procedure: Left Heart Cath and Coronary Angiography;  Surgeon: Nelva Bush, MD;  Location: [facility];  Service: Cardiovascular;  Laterality: N/A;  . SHOULDER ARTHROSCOPY    . TUBAL LIGATION      Current Medications: Current Meds  Medication Sig  . aspirin EC 81 MG tablet Take 81 mg by mouth daily.   Marland Kitchen atorvastatin (LIPITOR) 20 MG tablet Take 20 mg by mouth daily.  . benzonatate (TESSALON) 200 MG capsule Take 200 mg by mouth 3 (three) times daily as needed for cough.  . carvedilol (COREG) 3.125 MG tablet Take 3.125 mg by mouth 2 (two) times daily with a meal.  . Cholecalciferol (VITAMIN D3) 2000 units capsule Take 2,000 Units by mouth daily.   . clopidogrel (PLAVIX) 75 MG tablet Take 75 mg by mouth daily.  . DULoxetine (CYMBALTA) 60 MG capsule TK 1 C PO D  . hydrocortisone (CORTEF) 5 MG tablet Take 5-10 mg by mouth See admin instructions. 10 mg in the morning then 5 mg midday  . insulin degludec (TRESIBA FLEXTOUCH) 100 UNIT/ML SOPN FlexTouch Pen Inject 20 Units into the skin at bedtime.  . Iron-FA-B Cmp-C-Biot-Probiotic (FUSION PLUS) CAPS Take 1 capsule by mouth daily.  . isosorbide mononitrate (IMDUR) 60 MG 24 hr tablet Take 1 tablet (60 mg total) by mouth daily.  Marland Kitchen ketoconazole (NIZORAL) 2 % cream Apply 1 fingertip amount to toenails daily  . levothyroxine (SYNTHROID) 125 MCG tablet Take 125 mcg by  mouth daily.   Marland Kitchen lidocaine (XYLOCAINE) 5 % ointment Apply 1 application topically as needed (for neck pain).   . Multiple Vitamins-Minerals (EMERGEN-C IMMUNE PO) Take 1 capsule by mouth daily. CHEW  . nitroGLYCERIN (NITROSTAT) 0.4 MG SL tablet Place 0.4 mg under the tongue every 5 (five) minutes as needed for chest pain.   Marland Kitchen omeprazole (PRILOSEC) 40 MG capsule Take 40 mg by mouth daily.  . pramipexole (MIRAPEX) 1 MG tablet Take 1 mg by mouth 2 (two) times daily.  . sacubitril-valsartan (ENTRESTO) 24-26 MG Take 1 tablet by mouth 2 (two) times daily.  Marland Kitchen topiramate (TOPAMAX) 100 MG tablet Take 300 mg by mouth at bedtime.  . traZODone (DESYREL) 50 MG tablet Take 50 mg by mouth at  bedtime.   . TRULICITY 1.5 FI/4.3PI SOPN Inject 1.5 mg into the skin every 7 (seven) days.  Marland Kitchen umeclidinium-vilanterol (ANORO ELLIPTA) 62.5-25 MCG/INH AEPB Inhale 1 puff into the lungs daily.     Allergies:   Hydroxychloroquine; Donepezil; Prednisone; Bupropion; Metrizamide; Varenicline; and Tape   Social History   Socioeconomic History  . Marital status: Married    Spouse name: None  . Number of children: None  . Years of education: None  . Highest education level: None  Social Needs  . Financial resource strain: None  . Food insecurity - worry: None  . Food insecurity - inability: None  . Transportation needs - medical: None  . Transportation needs - non-medical: None  Occupational History  . None  Tobacco Use  . Smoking status: Current Every Day Smoker    Types: Cigarettes  . Smokeless tobacco: Never Used  Substance and Sexual Activity  . Alcohol use: Yes  . Drug use: No  . Sexual activity: None  Other Topics Concern  . None  Social History Narrative  . None     Family History: The patient's family history includes Diabetes in her father, sister, sister, and son; Stroke in her mother. ROS:   Please see the history of present illness.    All 14 point review of systems negative except as described per history of present illness  EKGs/Labs/Other Studies Reviewed:      Recent Labs: 01/25/2017: ALT 15; B Natriuretic Peptide 140.8; Hemoglobin 13.9; Platelets 227 03/02/2017: BUN 20; Creatinine, Ser 1.01; NT-Pro BNP 615; Potassium 4.4; Sodium 139  Recent Lipid Panel    Component Value Date/Time   CHOL 122 10/13/2016 0037   TRIG 68 10/13/2016 0037   HDL 34 (L) 10/13/2016 0037   CHOLHDL 3.6 10/13/2016 0037   VLDL 14 10/13/2016 0037   LDLCALC 74 10/13/2016 0037    Physical Exam:    VS:  BP 102/70 (BP Location: Left Arm, Patient Position: Sitting, Cuff Size: Normal)   Pulse 81   Ht 5\' 2"  (1.575 m)   Wt 223 lb (101.2 kg)   SpO2 96%   BMI 40.79 kg/m     Wt  Readings from Last 3 Encounters:  03/14/17 223 lb (101.2 kg)  03/02/17 218 lb 6.4 oz (99.1 kg)  12/01/16 200 lb 12.8 oz (91.1 kg)     GEN:  Well nourished, well developed in no acute distress HEENT: Normal NECK: No JVD; No carotid bruits LYMPHATICS: No lymphadenopathy CARDIAC: RRR, no murmurs, no rubs, no gallops RESPIRATORY:  Clear to auscultation without rales, wheezing or rhonchi  ABDOMEN: Soft, non-tender, non-distended MUSCULOSKELETAL:  No edema; No deformity  SKIN: Warm and dry LOWER EXTREMITIES: 1+ swelling NEUROLOGIC:  Alert and oriented x 3 PSYCHIATRIC:  Normal affect   ASSESSMENT:    1. Ischemic cardiomyopathy   2. Chronic combined systolic and diastolic CHF (congestive heart failure) ([place])   3. Essential hypertension    PLAN:    In order of problems listed above:  1. ; Myopathy: We will continue present management we will put on 40 mg of furosemide twice daily I will add 20 mg of potassium.  I will see her back in my office next week.  We will check a Chem-7 and proBNP today. 2. Chronic systolic and diastolic congestive heart failure plan as outlined above. 3. Initial hypertension: Blood pressure seems to be controlled   Medication Adjustments/Labs and Tests Ordered: Current medicines are reviewed at length with the patient today.  Concerns regarding medicines are outlined above.  No orders of the defined types were placed in this encounter.  Medication changes: No orders of the defined types were placed in this encounter.   Signed, Park Liter, MD, [facility] 03/14/2017 4:01 PM    [place]

## 2017-03-14 NOTE — Patient Instructions (Signed)
Medication Instructions:  Your physician has recommended you make the following change in your medication:  1) Start taking Furosemide 40 mg twice a day 2) Start taking Potassium 20 meq once daily  Labwork: Your physician recommends that you have lab work today: BMP and BNP to check kidney function, electrolytes and any fluid that may be building up  Testing/Procedures: None ordered  Follow-Up: Your physician recommends that you schedule a follow-up appointment in: 1 week follow up  Any Other Special Instructions Will Be Listed Below (If Applicable).     If you need a refill on your cardiac medications before your next appointment, please call your pharmacy.

## 2017-03-15 LAB — BASIC METABOLIC PANEL
BUN / CREAT RATIO: 22 (ref 12–28)
BUN: 24 mg/dL (ref 8–27)
CHLORIDE: 100 mmol/L (ref 96–106)
CO2: 28 mmol/L (ref 20–29)
CREATININE: 1.11 mg/dL — AB (ref 0.57–1.00)
Calcium: 9.5 mg/dL (ref 8.7–10.3)
GFR calc Af Amer: 62 mL/min/{1.73_m2} (ref >59)
GFR calc non Af Amer: 54 mL/min/{1.73_m2} — ABNORMAL LOW (ref >59)
GLUCOSE: 269 mg/dL — AB (ref 65–99)
Potassium: 4.5 mmol/L (ref 3.5–5.2)
Sodium: 139 mmol/L (ref 134–144)

## 2017-03-15 LAB — BRAIN NATRIURETIC PEPTIDE: BNP: 52.5 pg/mL (ref 0.0–100.0)

## 2017-03-23 ENCOUNTER — Ambulatory Visit (INDEPENDENT_AMBULATORY_CARE_PROVIDER_SITE_OTHER): Payer: Federal, State, Local not specified - PPO | Admitting: Cardiology

## 2017-03-23 ENCOUNTER — Encounter: Payer: Self-pay | Admitting: Cardiology

## 2017-03-23 VITALS — BP 120/64 | HR 87 | Ht 62.0 in | Wt 219.0 lb

## 2017-03-23 DIAGNOSIS — I5042 Chronic combined systolic (congestive) and diastolic (congestive) heart failure: Secondary | ICD-10-CM | POA: Diagnosis not present

## 2017-03-23 DIAGNOSIS — I10 Essential (primary) hypertension: Secondary | ICD-10-CM

## 2017-03-23 DIAGNOSIS — I2510 Atherosclerotic heart disease of native coronary artery without angina pectoris: Secondary | ICD-10-CM

## 2017-03-23 NOTE — Patient Instructions (Signed)
Medication Instructions:  Your physician recommends that you continue on your current medications as directed. Please refer to the Current Medication list given to you today.  Labwork: Your physician recommends that you have lab work today: BMP  Testing/Procedures: None ordered  Follow-Up: Your physician recommends that you schedule a follow-up appointment in: 2 weeks with Dr. Agustin Cree   Any Other Special Instructions Will Be Listed Below (If Applicable).     If you need a refill on your cardiac medications before your next appointment, please call your pharmacy.

## 2017-03-23 NOTE — Progress Notes (Signed)
Cardiology Office Note:    Date:  03/23/2017   ID:  Crystal Yates, DOB 05-05-1955, MRN [id number]  PCP:  Dortha Kern, PA  Cardiologist:  Jenne Campus, MD    Referring MD: Dortha Kern, [place]   Chief Complaint  Patient presents with  . Follow-up    1 week f/u; surgical clearance  Doing better but still some swelling  History of Present Illness:    Crystal Yates is a 61 y.o. female with congestive heart failure being clearly overloaded with fluid I gave her some diuretics with good response however still some way to go I will check a Chem-7 today I see her back in my office in 2 weeks  Past Medical History:  Diagnosis Date  . CAD in native artery    a. Prior LAD stenting based on cath. b. RCA stenting 03/2016 x2.  . Chronic combined systolic and diastolic CHF (congestive heart failure) ([place])   . CKD (chronic kidney disease), stage II   . COPD (chronic obstructive pulmonary disease) ([place])   . Diabetes mellitus without complication ([place])   . Hashimoto's thyroiditis   . Hyperlipidemia   . Hypertension   . Secondary adrenal insufficiency ([place])   . Thyroid disease   . Tobacco abuse     Past Surgical History:  Procedure Laterality Date  . ABDOMINAL SURGERY    . CESAREAN SECTION    . CHOLECYSTECTOMY    . COLON RESECTION    . HERNIA MESH REMOVAL    . HERNIA REPAIR    . LEFT HEART CATH AND CORONARY ANGIOGRAPHY N/A 10/13/2016   Procedure: Left Heart Cath and Coronary Angiography;  Surgeon: Nelva Bush, MD;  Location: [facility];  Service: Cardiovascular;  Laterality: N/A;  . SHOULDER ARTHROSCOPY    . TUBAL LIGATION      Current Medications: Current Meds  Medication Sig  . aspirin EC 81 MG tablet Take 81 mg by mouth daily.   Marland Kitchen atorvastatin (LIPITOR) 20 MG tablet Take 20 mg by mouth daily.  . benzonatate (TESSALON) 200 MG capsule Take 200 mg by mouth 3 (three) times daily as needed for cough.  . carvedilol (COREG) 3.125 MG tablet Take 3.125 mg by mouth  2 (two) times daily with a meal.  . Cholecalciferol (VITAMIN D3) 2000 units capsule Take 2,000 Units by mouth daily.   . clopidogrel (PLAVIX) 75 MG tablet Take 75 mg by mouth daily.  . DULoxetine (CYMBALTA) 60 MG capsule TK 1 C PO D  . furosemide (LASIX) 40 MG tablet Take 1 tablet (40 mg total) by mouth 2 (two) times daily.  . hydrocortisone (CORTEF) 5 MG tablet Take 5-10 mg by mouth See admin instructions. 10 mg in the morning then 5 mg midday  . insulin degludec (TRESIBA FLEXTOUCH) 100 UNIT/ML SOPN FlexTouch Pen Inject 20 Units into the skin at bedtime.  . Iron-FA-B Cmp-C-Biot-Probiotic (FUSION PLUS) CAPS Take 1 capsule by mouth daily.  . isosorbide mononitrate (IMDUR) 60 MG 24 hr tablet Take 1 tablet (60 mg total) by mouth daily.  Marland Kitchen ketoconazole (NIZORAL) 2 % cream Apply 1 fingertip amount to toenails daily  . levothyroxine (SYNTHROID) 125 MCG tablet Take 125 mcg by mouth daily.   Marland Kitchen lidocaine (XYLOCAINE) 5 % ointment Apply 1 application topically as needed (for neck pain).   . nitroGLYCERIN (NITROSTAT) 0.4 MG SL tablet Place 0.4 mg under the tongue every 5 (five) minutes as needed for chest pain.   Marland Kitchen omeprazole (PRILOSEC) 40 MG capsule Take 40 mg  by mouth daily.  . potassium chloride SA (K-DUR,KLOR-CON) 20 MEQ tablet Take 1 tablet (20 mEq total) by mouth daily.  . pramipexole (MIRAPEX) 1 MG tablet Take 1 mg by mouth 2 (two) times daily.  . sacubitril-valsartan (ENTRESTO) 24-26 MG Take 1 tablet by mouth 2 (two) times daily.  Marland Kitchen topiramate (TOPAMAX) 100 MG tablet Take 300 mg by mouth at bedtime.  . traZODone (DESYREL) 50 MG tablet Take 50 mg by mouth at bedtime.   . TRULICITY 1.5 ZJ/6.7HA SOPN Inject 1.5 mg into the skin every 7 (seven) days.  Marland Kitchen umeclidinium-vilanterol (ANORO ELLIPTA) 62.5-25 MCG/INH AEPB Inhale 1 puff into the lungs daily.     Allergies:   Hydroxychloroquine; Donepezil; Prednisone; Bupropion; Metrizamide; Varenicline; and Tape   Social History   Socioeconomic History  .  Marital status: Married    Spouse name: None  . Number of children: None  . Years of education: None  . Highest education level: None  Social Needs  . Financial resource strain: None  . Food insecurity - worry: None  . Food insecurity - inability: None  . Transportation needs - medical: None  . Transportation needs - non-medical: None  Occupational History  . None  Tobacco Use  . Smoking status: Current Every Day Smoker    Types: Cigarettes  . Smokeless tobacco: Never Used  Substance and Sexual Activity  . Alcohol use: Yes  . Drug use: No  . Sexual activity: None  Other Topics Concern  . None  Social History Narrative  . None     Family History: The patient's family history includes Diabetes in her father, sister, sister, and son; Stroke in her mother. ROS:   Please see the history of present illness.    All 14 point review of systems negative except as described per history of present illness  EKGs/Labs/Other Studies Reviewed:      Recent Labs: 01/25/2017: ALT 15; Hemoglobin 13.9; Platelets 227 03/02/2017: NT-Pro BNP 615 03/14/2017: BNP 52.5; BUN 24; Creatinine, Ser 1.11; Potassium 4.5; Sodium 139  Recent Lipid Panel    Component Value Date/Time   CHOL 122 10/13/2016 0037   TRIG 68 10/13/2016 0037   HDL 34 (L) 10/13/2016 0037   CHOLHDL 3.6 10/13/2016 0037   VLDL 14 10/13/2016 0037   LDLCALC 74 10/13/2016 0037    Physical Exam:    VS:  BP 120/64 (BP Location: Left Arm, Patient Position: Sitting)   Pulse 87   Ht 5\' 2"  (1.575 m)   Wt 219 lb (99.3 kg)   SpO2 95%   BMI 40.06 kg/m     Wt Readings from Last 3 Encounters:  03/23/17 219 lb (99.3 kg)  03/14/17 223 lb (101.2 kg)  03/02/17 218 lb 6.4 oz (99.1 kg)     GEN:  Well nourished, well developed in no acute distress HEENT: Normal NECK: No JVD; No carotid bruits LYMPHATICS: No lymphadenopathy CARDIAC: RRR, no murmurs, no rubs, no gallops RESPIRATORY:  Clear to auscultation without rales, wheezing or  rhonchi  ABDOMEN: Soft, non-tender, non-distended MUSCULOSKELETAL:  No edema; No deformity  SKIN: Warm and dry LOWER EXTREMITIES: no swelling NEUROLOGIC:  Alert and oriented x 3 PSYCHIATRIC:  Normal affect   ASSESSMENT:    1. Atherosclerosis of native coronary artery of native heart without angina pectoris   2. Chronic combined systolic and diastolic CHF (congestive heart failure) ([place])   3. Essential hypertension    PLAN:    In order of problems listed above:  1. Atherosclerosis: Stable  from that point of view continue present management. 2. Ingestive heart rate getting better we will continue present management we will check Chem-7 today 3. Essential hypertension pressure control.   Medication Adjustments/Labs and Tests Ordered: Current medicines are reviewed at length with the patient today.  Concerns regarding medicines are outlined above.  No orders of the defined types were placed in this encounter.  Medication changes: No orders of the defined types were placed in this encounter.   Signed, Park Liter, MD, [facility] 03/23/2017 4:47 PM    [place]

## 2017-03-23 NOTE — Addendum Note (Signed)
Addended by: Aleatha Borer on: 03/23/2017 04:54 PM   Modules accepted: Orders

## 2017-03-24 ENCOUNTER — Telehealth: Payer: Self-pay | Admitting: Cardiology

## 2017-03-24 LAB — BASIC METABOLIC PANEL
BUN/Creatinine Ratio: 19 (ref 12–28)
BUN: 24 mg/dL (ref 8–27)
CALCIUM: 9.4 mg/dL (ref 8.7–10.3)
CHLORIDE: 105 mmol/L (ref 96–106)
CO2: 20 mmol/L (ref 20–29)
Creatinine, Ser: 1.26 mg/dL — ABNORMAL HIGH (ref 0.57–1.00)
GFR, EST AFRICAN AMERICAN: 53 mL/min/{1.73_m2} — AB (ref >59)
GFR, EST NON AFRICAN AMERICAN: 46 mL/min/{1.73_m2} — AB (ref >59)
Glucose: 298 mg/dL — ABNORMAL HIGH (ref 65–99)
Potassium: 4.8 mmol/L (ref 3.5–5.2)
Sodium: 139 mmol/L (ref 134–144)

## 2017-03-24 NOTE — Telephone Encounter (Signed)
Emanuella called to ask if Doctor Agustin Cree wanted her to have an ECHO? She was just here yesterday. Please call on home phone.

## 2017-03-26 ENCOUNTER — Encounter (HOSPITAL_COMMUNITY): Payer: Self-pay | Admitting: Emergency Medicine

## 2017-03-26 ENCOUNTER — Other Ambulatory Visit: Payer: Self-pay

## 2017-03-26 ENCOUNTER — Emergency Department (HOSPITAL_COMMUNITY)
Admission: EM | Admit: 2017-03-26 | Discharge: 2017-03-26 | Disposition: A | Discharge status: Home / Self Care | Payer: Federal, State, Local not specified - PPO | Attending: Emergency Medicine | Admitting: Emergency Medicine

## 2017-03-26 ENCOUNTER — Emergency Department (HOSPITAL_COMMUNITY): Payer: Federal, State, Local not specified - PPO

## 2017-03-26 DIAGNOSIS — I5042 Chronic combined systolic (congestive) and diastolic (congestive) heart failure: Secondary | ICD-10-CM | POA: Insufficient documentation

## 2017-03-26 DIAGNOSIS — I130 Hypertensive heart and chronic kidney disease with heart failure and stage 1 through stage 4 chronic kidney disease, or unspecified chronic kidney disease: Secondary | ICD-10-CM | POA: Diagnosis not present

## 2017-03-26 DIAGNOSIS — Z7902 Long term (current) use of antithrombotics/antiplatelets: Secondary | ICD-10-CM | POA: Diagnosis not present

## 2017-03-26 DIAGNOSIS — Z79899 Other long term (current) drug therapy: Secondary | ICD-10-CM | POA: Insufficient documentation

## 2017-03-26 DIAGNOSIS — N182 Chronic kidney disease, stage 2 (mild): Secondary | ICD-10-CM | POA: Insufficient documentation

## 2017-03-26 DIAGNOSIS — R600 Localized edema: Secondary | ICD-10-CM | POA: Insufficient documentation

## 2017-03-26 DIAGNOSIS — J449 Chronic obstructive pulmonary disease, unspecified: Secondary | ICD-10-CM | POA: Insufficient documentation

## 2017-03-26 DIAGNOSIS — E119 Type 2 diabetes mellitus without complications: Secondary | ICD-10-CM | POA: Insufficient documentation

## 2017-03-26 DIAGNOSIS — G4733 Obstructive sleep apnea (adult) (pediatric): Secondary | ICD-10-CM | POA: Diagnosis not present

## 2017-03-26 DIAGNOSIS — I2510 Atherosclerotic heart disease of native coronary artery without angina pectoris: Secondary | ICD-10-CM | POA: Diagnosis not present

## 2017-03-26 DIAGNOSIS — F17210 Nicotine dependence, cigarettes, uncomplicated: Secondary | ICD-10-CM | POA: Diagnosis not present

## 2017-03-26 DIAGNOSIS — E039 Hypothyroidism, unspecified: Secondary | ICD-10-CM | POA: Insufficient documentation

## 2017-03-26 DIAGNOSIS — J441 Chronic obstructive pulmonary disease with (acute) exacerbation: Secondary | ICD-10-CM | POA: Insufficient documentation

## 2017-03-26 DIAGNOSIS — R0602 Shortness of breath: Secondary | ICD-10-CM | POA: Diagnosis not present

## 2017-03-26 LAB — CBC
HCT: 37.2 % (ref 36.0–46.0)
Hemoglobin: 12.3 g/dL (ref 12.0–15.0)
MCH: 31.5 pg (ref 26.0–34.0)
MCHC: 33.1 g/dL (ref 30.0–36.0)
MCV: 95.1 fL (ref 78.0–100.0)
Platelets: 216 10*3/uL (ref 150–400)
RBC: 3.91 MIL/uL (ref 3.87–5.11)
RDW: 13.6 % (ref 11.5–15.5)
WBC: 7.9 10*3/uL (ref 4.0–10.5)

## 2017-03-26 LAB — BASIC METABOLIC PANEL
Anion gap: 6 (ref 5–15)
BUN: 28 mg/dL — AB (ref 6–20)
CHLORIDE: 102 mmol/L (ref 101–111)
CO2: 28 mmol/L (ref 22–32)
CREATININE: 1.24 mg/dL — AB (ref 0.44–1.00)
Calcium: 9.5 mg/dL (ref 8.9–10.3)
GFR calc Af Amer: 53 mL/min — ABNORMAL LOW (ref >60)
GFR calc non Af Amer: 46 mL/min — ABNORMAL LOW (ref >60)
GLUCOSE: 331 mg/dL — AB (ref 65–99)
Potassium: 3.8 mmol/L (ref 3.5–5.1)
Sodium: 136 mmol/L (ref 135–145)

## 2017-03-26 LAB — TROPONIN I: Troponin I: <0.03 ng/mL (ref <0.03)

## 2017-03-26 LAB — BRAIN NATRIURETIC PEPTIDE: B Natriuretic Peptide: 63.7 pg/mL (ref 0.0–100.0)

## 2017-03-26 MED ORDER — IPRATROPIUM BROMIDE 0.02 % IN SOLN
0.5000 mg | Freq: Once | RESPIRATORY_TRACT | Status: AC
  Administered 2017-03-26: 0.5 mg via RESPIRATORY_TRACT
  Filled 2017-03-26: qty 2.5

## 2017-03-26 MED ORDER — FUROSEMIDE 10 MG/ML IJ SOLN
40.0000 mg | Freq: Once | INTRAMUSCULAR | Status: AC
  Administered 2017-03-26: 40 mg via INTRAVENOUS
  Filled 2017-03-26: qty 4

## 2017-03-26 MED ORDER — HYDROCODONE-ACETAMINOPHEN 5-325 MG PO TABS
1.0000 | ORAL_TABLET | Freq: Once | ORAL | Status: AC
  Administered 2017-03-26: 1 via ORAL
  Filled 2017-03-26: qty 1

## 2017-03-26 MED ORDER — ALBUTEROL SULFATE (2.5 MG/3ML) 0.083% IN NEBU
5.0000 mg | INHALATION_SOLUTION | Freq: Once | RESPIRATORY_TRACT | Status: AC
  Administered 2017-03-26: 5 mg via RESPIRATORY_TRACT
  Filled 2017-03-26: qty 6

## 2017-03-26 NOTE — ED Triage Notes (Signed)
Reports having hx of CHF.  Pt reports her cardiologist told her that she was in CHF and is postponing surgery due to increased swelling in legs.  Also reporting sob and large increase in weight recently.

## 2017-03-26 NOTE — ED Provider Notes (Signed)
[facility] EMERGENCY DEPARTMENT Provider Note   CSN: [id number] Arrival date & time: 03/26/17  0127     History   Chief Complaint Chief Complaint  Patient presents with  . Shortness of Breath  . Leg Swelling    HPI Crystal Yates is a 61 y.o. female.  HPI  61 year old female with a history of systolic and diastolic CHF, chronic kidney disease, COPD and CAD presents with leg swelling and shortness of breath.  She is concerned for CHF.  She saw her cardiologist, Dr. Agustin Cree on 12/27.  She saw him on 12/18 and he put her on furosemide because of leg swelling and concern for developing CHF.  She is currently trying to get cardiac clearance for hernia repairs.  She has been feeling short of breath and having bilateral leg swelling for about 2 weeks.  This is waxed and waned and initially seemed to get better with Lasix but now is not.  She states she is taking 20 mg twice per day.  She has had some on and off very brief chest pain that she describes as sharp and lasting only a few seconds.  She does not even have time to get a nitroglycerin.  Shortness of breath is now becoming more constant over the last 3 days and her weight seems to be going back up.  3 days ago her weight was 219 pounds and today it was 220.  This is when she was weighed in the office and in the ED.  She has a chronic cough that has not changed since May since she had pneumonia.  Past Medical History:  Diagnosis Date  . CAD in native artery    a. Prior LAD stenting based on cath. b. RCA stenting 03/2016 x2.  . Chronic combined systolic and diastolic CHF (congestive heart failure) ([place])   . CKD (chronic kidney disease), stage II   . COPD (chronic obstructive pulmonary disease) ([place])   . Diabetes mellitus without complication ([place])   . Hashimoto's thyroiditis   . Hyperlipidemia   . Hypertension   . Secondary adrenal insufficiency ([place])   . Thyroid disease   . Tobacco abuse     Patient Active  Problem List   Diagnosis Date Noted  . COPD exacerbation ([place]) 10/14/2016  . Chronic combined systolic and diastolic CHF (congestive heart failure) ([place]) 10/13/2016  . Chest tightness or pressure 10/12/2016  . Dyspnea on exertion 09/29/2016  . History of coronary artery disease 09/29/2016  . OAB (overactive bladder) 09/21/2016  . Neck pain 09/08/2016  . Paresthesia of arm 09/08/2016  . Flank pain 08/31/2016  . Oxygen dependent 06/20/2016  . Neuralgia of right upper extremity 04/26/2016  . Coronary artery disease 04/16/2016  . Lymphocele after surgical procedure 04/16/2016  . Ventricular tachycardia (paroxysmal) ([place]) 04/09/2016  . Chronic systolic heart failure ([place]) 04/08/2016  . Ischemic cardiomyopathy 04/08/2016  . Type 2 diabetes mellitus without complication, with long-term current use of insulin ([place]) 04/08/2016  . Anemia, blood loss 09/16/2015  . GI bleeding 09/16/2015  . Precordial pain 09/14/2015  . Chest pain 05/22/2015  . Hypokalemia 05/22/2015  . Hypothyroid 05/22/2015  . Status post hernia repair 01/29/2015  . Surgical wound breakdown 01/29/2015  . Hernia with strangulation 01/18/2015  . Hyperkalemia 01/18/2015  . Tobacco abuse 01/18/2015  . Recurrent incisional hernia with incarceration 01/15/2015  . Sleep apnea 01/15/2015  . S/P drug eluting coronary stent placement 10/16/2014  . Adrenal insufficiency ([place]) 11/21/2013  . Anemia 11/21/2013  .  Unstable angina ([place]) 11/21/2013  . Atherosclerotic heart disease of native coronary artery without angina pectoris 11/21/2013  . Cardiomyopathy ([place]) 11/21/2013  . Congestive heart failure ([place]) 11/21/2013  . Diabetes mellitus with no complication ([place]) 16/96/7893  . Edema 11/21/2013  . Empty sella syndrome ([place]) 11/21/2013  . Hepatitis 11/21/2013  . Hyperlipemia 11/21/2013  . Hypertension 11/21/2013  . Kidney cyst, acquired 11/21/2013  . Lymph nodes enlarged 11/21/2013  . Fat necrosis 11/21/2013  . Other emphysema  ([place]) 11/21/2013  . Renal disorder 11/21/2013  . Shortness of breath 11/21/2013  . Sjogrens syndrome ([place]) 11/21/2013  . Thyroid disorder 11/21/2013  . Nicotine dependence, uncomplicated 81/03/7508  . Major depressive disorder, recurrent episode ([place]) 09/17/2013  . Displacement of lumbar intervertebral disc 06/14/2013  . Intractable migraine without aura 06/14/2013  . Mild cognitive impairment 06/14/2013  . Polyneuropathy in diabetes ([place]) 06/14/2013  . Adrenal cortex insufficiency ([place]) 04/12/2011  . DDD (degenerative disc disease), lumbosacral 01/11/2008  . Thoracic or lumbosacral neuritis or radiculitis 01/11/2008    Past Surgical History:  Procedure Laterality Date  . ABDOMINAL SURGERY    . CESAREAN SECTION    . CHOLECYSTECTOMY    . COLON RESECTION    . HERNIA MESH REMOVAL    . HERNIA REPAIR    . LEFT HEART CATH AND CORONARY ANGIOGRAPHY N/A 10/13/2016   Procedure: Left Heart Cath and Coronary Angiography;  Surgeon: Nelva Bush, MD;  Location: [facility];  Service: Cardiovascular;  Laterality: N/A;  . SHOULDER ARTHROSCOPY    . TUBAL LIGATION      OB History    No data available       Home Medications    Prior to Admission medications   Medication Sig Start Date End Date Taking? Authorizing Provider  aspirin EC 81 MG tablet Take 81 mg by mouth daily.  04/13/16 06/07/17 Yes [provider]  atorvastatin (LIPITOR) 20 MG tablet Take 20 mg by mouth daily. 12/12/14  Yes [provider]  benzonatate (TESSALON) 200 MG capsule Take 200 mg by mouth 3 (three) times daily as needed for cough.   Yes [provider]  carvedilol (COREG) 3.125 MG tablet Take 3.125 mg by mouth 2 (two) times daily with a meal.   Yes [provider]  Cholecalciferol (VITAMIN D3) 2000 units capsule Take 2,000 Units by mouth daily.    Yes [provider]  clopidogrel (PLAVIX) 75 MG tablet Take 75 mg by mouth daily.   Yes [provider]    DULoxetine (CYMBALTA) 60 MG capsule take one 60mg  capsule 12/05/16  Yes [provider]  furosemide (LASIX) 40 MG tablet Take 1 tablet (40 mg total) by mouth 2 (two) times daily. Patient taking differently: Take 20 mg by mouth 2 (two) times daily.  03/14/17 06/12/17 Yes Park Liter, MD  hydrocortisone (CORTEF) 5 MG tablet Take 5-10 mg by mouth See admin instructions. 10 mg in the morning then 5 mg midday   Yes [provider]  insulin degludec (TRESIBA FLEXTOUCH) 100 UNIT/ML SOPN FlexTouch Pen Inject 28 Units into the skin at bedtime.  08/28/16  Yes [provider]  Iron-FA-B Cmp-C-Biot-Probiotic (FUSION PLUS) CAPS Take 1 capsule by mouth daily. 10/09/16  Yes [provider]  isosorbide mononitrate (IMDUR) 60 MG 24 hr tablet Take 1 tablet (60 mg total) by mouth daily. 03/10/17  Yes Park Liter, MD  ketoconazole (NIZORAL) 2 % cream Apply 1 fingertip amount to toenails daily 01/02/17  Yes Hardie Pulley  J, DPM  levothyroxine (SYNTHROID) 125 MCG tablet Take 125 mcg by mouth daily.  10/29/15  Yes [provider]  lidocaine (XYLOCAINE) 5 % ointment Apply 1 application topically as needed (for neck pain).  06/27/16 06/27/17 Yes [provider]  Multiple Vitamins-Minerals (EMERGEN-C IMMUNE PO) Take 1 capsule by mouth daily. CHEW   Yes [provider]  omeprazole (PRILOSEC) 40 MG capsule Take 40 mg by mouth daily.   Yes [provider]  potassium chloride SA (K-DUR,KLOR-CON) 20 MEQ tablet Take 1 tablet (20 mEq total) by mouth daily. 03/14/17  Yes Park Liter, MD  pramipexole (MIRAPEX) 1 MG tablet Take 1 mg by mouth 2 (two) times daily.   Yes [provider]  sacubitril-valsartan (ENTRESTO) 24-26 MG Take 1 tablet by mouth 2 (two) times daily. 10/20/16  Yes Park Liter, MD  topiramate (TOPAMAX) 100 MG tablet Take 300 mg by mouth at bedtime.   Yes [provider]  traZODone (DESYREL) 50 MG tablet Take  50 mg by mouth at bedtime.  01/11/16  Yes [provider]  TRULICITY 1.5 UX/3.2GM SOPN Inject 1.5 mg into the skin every 7 (seven) days. 10/10/16  Yes [provider]  umeclidinium-vilanterol (ANORO ELLIPTA) 62.5-25 MCG/INH AEPB Inhale 1 puff into the lungs daily.   Yes [provider]  nitroGLYCERIN (NITROSTAT) 0.4 MG SL tablet Place 0.4 mg under the tongue every 5 (five) minutes as needed for chest pain.     [provider]    Family History Family History  Problem Relation Age of Onset  . Stroke Mother   . Diabetes Father   . Diabetes Sister   . Diabetes Sister   . Diabetes Son     Social History Social History   Tobacco Use  . Smoking status: Current Every Day Smoker    Types: Cigarettes  . Smokeless tobacco: Never Used  Substance Use Topics  . Alcohol use: Yes  . Drug use: No     Allergies   Hydroxychloroquine; Donepezil; Prednisone; Bupropion; Metrizamide; Varenicline; and Tape   Review of Systems Review of Systems  Constitutional: Negative for fever.  Respiratory: Positive for cough and shortness of breath.   Cardiovascular: Positive for chest pain and leg swelling.  Gastrointestinal: Negative for abdominal pain.  All other systems reviewed and are negative.    Physical Exam Updated Vital Signs BP 98/61   Pulse 77   Temp 97.9 F (36.6 C) (Oral)   Resp 17   Ht 5\' 2"  (1.575 m)   Wt 99.8 kg (220 lb)   SpO2 97%   BMI 40.24 kg/m   Physical Exam  Constitutional: She is oriented to person, place, and time. She appears well-developed and well-nourished.  Non-toxic appearance. She does not appear ill. No distress.  HENT:  Head: Normocephalic and atraumatic.  Right Ear: External ear normal.  Left Ear: External ear normal.  Nose: Nose normal.  Eyes: Right eye exhibits no discharge. Left eye exhibits no discharge.  Cardiovascular: Normal rate, regular rhythm and normal heart sounds.  Pulmonary/Chest: Effort normal. No  accessory muscle usage. Tachypnea noted. She has wheezes (mild, expiratory, diffuse).  Abdominal: Soft. There is no tenderness.  Musculoskeletal:       Right lower leg: She exhibits edema (pitting edema up to knee).       Left lower leg: She exhibits edema (pitting edema up to knee).  Neurological: She is alert and oriented to person, place, and time.  Skin: Skin is warm and  dry.  Nursing note and vitals reviewed.    ED Treatments / Results  Labs (all labs ordered are listed, but only abnormal results are displayed) Labs Reviewed  BASIC METABOLIC PANEL - Abnormal; Notable for the following components:      Result Value   Glucose, Bld 331 (*)    BUN 28 (*)    Creatinine, Ser 1.24 (*)    GFR calc non Af Amer 46 (*)    GFR calc Af Amer 53 (*)    All other components within normal limits  CBC  TROPONIN I  BRAIN NATRIURETIC PEPTIDE    EKG  EKG Interpretation  Date/Time:  Sunday March 26 2017 09:22:47 EST Ventricular Rate:  72 PR Interval:  142 QRS Duration: 94 QT Interval:  421 QTC Calculation: 461 R Axis:   71 Text Interpretation:  Sinus rhythm Nonspecific T abnormalities, lateral leads no significant change since Oct 2018 Confirmed by Sherwood Gambler [phone]) on 03/26/2017 9:40:49 AM       Radiology Dg Chest 2 View  Result Date: 03/26/2017 CLINICAL DATA:  61 year old female with shortness of breath. EXAM: CHEST  2 VIEW COMPARISON:  Chest radiograph dated 03/08/2017 FINDINGS: Minimal bibasilar atelectatic changes. No focal consolidation, pleural effusion, or pneumothorax. The cardiac silhouette is within normal limits. Coronary vascular calcification. Lower cervical fusion plate and screws. No acute osseous pathology. IMPRESSION: No active cardiopulmonary disease. Electronically Signed   By: Anner Crete M.D.   On: 03/26/2017 02:15    Procedures Procedures (including critical care time)  Medications Ordered in ED Medications  HYDROcodone-acetaminophen  (NORCO/VICODIN) 5-325 MG per tablet 1 tablet (not administered)  albuterol (PROVENTIL) (2.5 MG/3ML) 0.083% nebulizer solution 5 mg (5 mg Nebulization Given 03/26/17 1000)  ipratropium (ATROVENT) nebulizer solution 0.5 mg (0.5 mg Nebulization Given 03/26/17 1000)  furosemide (LASIX) injection 40 mg (40 mg Intravenous Given 03/26/17 1017)     Initial Impression / Assessment and Plan / ED Course  I have reviewed the triage vital signs and the nursing notes.  Pertinent labs & imaging results that were available during my care of the patient were reviewed by me and considered in my medical decision making (see chart for details).     Patient is feeling much better after an albuterol treatment.  Her wheezing is gone.  Thus I think the shortness of breath is not from fluid overload but rather from some mild bronchospasm.  Given that resolved with only one treatment I do not think steroids would be needed, especially with the potential downside effect of worsening hyperglycemia.  Otherwise her swelling is more peripheral than likely a significant CHF exacerbation.  Her chart indicates she should be on 40 mg Lasix twice daily but she is on 20 twice daily.  Thus I have told her to increase to 40 twice daily as well as double her potassium during this time.  Use her albuterol for shortness of breath.  She has vague intermittent chest pain but I do not think this is ACS or coronary ischemia.  Follow-up with her cardiologist.  Return precautions.  Final Clinical Impressions(s) / ED Diagnoses   Final diagnoses:  COPD exacerbation ([place])  Bilateral lower extremity edema    ED Discharge Orders    None       Sherwood Gambler, MD 03/26/17 1137

## 2017-03-26 NOTE — Discharge Instructions (Signed)
Increase your Lasix from 20 mg twice per day to 40 mg twice per day.  Increase your potassium from 20 mEq/day to 20 mEq/day twice per day.  You should also use your albuterol every 4 hours as needed for shortness of breath.  Follow-up with your cardiologist in the next week or 2.

## 2017-03-26 NOTE — ED Notes (Signed)
Pt returns from bathroom and Dr. Regenia Skeeter at bedside

## 2017-03-27 ENCOUNTER — Other Ambulatory Visit: Payer: Self-pay

## 2017-03-27 DIAGNOSIS — I429 Cardiomyopathy, unspecified: Secondary | ICD-10-CM

## 2017-03-27 NOTE — Telephone Encounter (Signed)
Can you please schedule this patient for her echo?

## 2017-03-27 NOTE — Telephone Encounter (Signed)
Yes, lets do echo on [place] [employer]

## 2017-03-29 ENCOUNTER — Ambulatory Visit (HOSPITAL_BASED_OUTPATIENT_CLINIC_OR_DEPARTMENT_OTHER)
Admission: RE | Admit: 2017-03-29 | Discharge: 2017-03-29 | Disposition: A | Discharge status: Home / Self Care | Payer: Federal, State, Local not specified - PPO | Source: Ambulatory Visit | Attending: Cardiology | Admitting: Cardiology

## 2017-03-29 DIAGNOSIS — I429 Cardiomyopathy, unspecified: Secondary | ICD-10-CM | POA: Diagnosis not present

## 2017-03-29 NOTE — Progress Notes (Signed)
Echocardiogram 2D Echocardiogram has been performed.  Crystal Yates 03/29/2017, 3:53 PM

## 2017-03-31 DIAGNOSIS — G5631 Lesion of radial nerve, right upper limb: Secondary | ICD-10-CM | POA: Insufficient documentation

## 2017-04-01 DIAGNOSIS — J449 Chronic obstructive pulmonary disease, unspecified: Secondary | ICD-10-CM | POA: Diagnosis not present

## 2017-04-01 DIAGNOSIS — M791 Myalgia, unspecified site: Secondary | ICD-10-CM | POA: Diagnosis not present

## 2017-04-01 DIAGNOSIS — I2510 Atherosclerotic heart disease of native coronary artery without angina pectoris: Secondary | ICD-10-CM | POA: Diagnosis not present

## 2017-04-01 DIAGNOSIS — R05 Cough: Secondary | ICD-10-CM | POA: Diagnosis not present

## 2017-04-01 DIAGNOSIS — R0602 Shortness of breath: Secondary | ICD-10-CM | POA: Diagnosis not present

## 2017-04-01 DIAGNOSIS — I110 Hypertensive heart disease with heart failure: Secondary | ICD-10-CM | POA: Diagnosis not present

## 2017-04-01 DIAGNOSIS — J111 Influenza due to unidentified influenza virus with other respiratory manifestations: Secondary | ICD-10-CM | POA: Diagnosis not present

## 2017-04-01 DIAGNOSIS — F17210 Nicotine dependence, cigarettes, uncomplicated: Secondary | ICD-10-CM | POA: Diagnosis not present

## 2017-04-01 DIAGNOSIS — I509 Heart failure, unspecified: Secondary | ICD-10-CM | POA: Diagnosis not present

## 2017-04-03 ENCOUNTER — Ambulatory Visit: Payer: Federal, State, Local not specified - PPO | Admitting: Podiatry

## 2017-04-03 ENCOUNTER — Telehealth: Payer: Self-pay | Admitting: Cardiology

## 2017-04-03 DIAGNOSIS — E2740 Unspecified adrenocortical insufficiency: Secondary | ICD-10-CM | POA: Diagnosis not present

## 2017-04-03 DIAGNOSIS — E890 Postprocedural hypothyroidism: Secondary | ICD-10-CM | POA: Diagnosis not present

## 2017-04-03 NOTE — Telephone Encounter (Signed)
Called pt line busy. Dr. Raliegh Ip will discuss this at the pts appointment this week.

## 2017-04-03 NOTE — Telephone Encounter (Signed)
S/w pt she states her endocronolgist suggest she see Dr. Raliegh Ip soon d/t orthostatic hypotension and wants pt to decrease lasix to 20 mg daily. Pt has copy of readings which include sitting 102/69, lying 116/59 and standing 94/64. I have moved pts appointment up from Thursday to tomorrow. Pt has requested the time of 340. Advised Dr. Raliegh Ip would discuss with her at this time treatment plan/options.

## 2017-04-03 NOTE — Telephone Encounter (Signed)
Patient states her endocrinologist today is worried about her blood pressure

## 2017-04-04 ENCOUNTER — Ambulatory Visit (INDEPENDENT_AMBULATORY_CARE_PROVIDER_SITE_OTHER): Payer: Federal, State, Local not specified - PPO | Admitting: Cardiology

## 2017-04-04 ENCOUNTER — Encounter: Payer: Self-pay | Admitting: Cardiology

## 2017-04-04 VITALS — BP 116/72 | HR 100 | Ht 62.0 in | Wt 218.0 lb

## 2017-04-04 DIAGNOSIS — I10 Essential (primary) hypertension: Secondary | ICD-10-CM

## 2017-04-04 DIAGNOSIS — I110 Hypertensive heart disease with heart failure: Secondary | ICD-10-CM | POA: Diagnosis not present

## 2017-04-04 DIAGNOSIS — I255 Ischemic cardiomyopathy: Secondary | ICD-10-CM

## 2017-04-04 DIAGNOSIS — R05 Cough: Secondary | ICD-10-CM | POA: Diagnosis not present

## 2017-04-04 DIAGNOSIS — I5042 Chronic combined systolic (congestive) and diastolic (congestive) heart failure: Secondary | ICD-10-CM

## 2017-04-04 DIAGNOSIS — Z6841 Body Mass Index (BMI) 40.0 and over, adult: Secondary | ICD-10-CM | POA: Diagnosis not present

## 2017-04-04 NOTE — Patient Instructions (Signed)
Medication Instructions:  Your physician recommends that you continue on your current medications as directed. Please refer to the Current Medication list given to you today.  Labwork: None ordered  Testing/Procedures: None ordered  Follow-Up: Your physician recommends that you schedule a follow-up appointment in: 3 months with Dr. Krasowski   Any Other Special Instructions Will Be Listed Below (If Applicable).     If you need a refill on your cardiac medications before your next appointment, please call your pharmacy.   

## 2017-04-04 NOTE — Progress Notes (Signed)
Cardiology Office Note:    Date:  04/04/2017   ID:  Crystal Yates, DOB December 22, 1955, MRN [id number]  PCP:  Dortha Kern, PA  Cardiologist:  Jenne Campus, MD    Referring MD: Dortha Kern, [place]   No chief complaint on file. Doing well but weak and tired  History of Present Illness:    Crystal Yates is a 62 y.o. female with coronary artery disease, cardiomyopathy, ejection fraction 4045% in change within last year comes today to my office for follow-up she is coughing a lot.  She is still insisting having her surgery done an abdominal wall.  I told her overall from cardiac standpoint review I think we reached optimal however she carry significant risk for surgery from a respiratory point of view and also her diabetes being poorly controlled.  Just yesterday she seen endocrinologist some tests were done kidney function normalized, she does have a very high sugar.  She admits to do some dietary indiscretions.  She reports to have some dizzy spells when she tried to get up quickly.  That goes away typically within a few seconds of standing.  No passing out.  Past Medical History:  Diagnosis Date  . CAD in native artery    a. Prior LAD stenting based on cath. b. RCA stenting 03/2016 x2.  . Chronic combined systolic and diastolic CHF (congestive heart failure) ([place])   . CKD (chronic kidney disease), stage II   . COPD (chronic obstructive pulmonary disease) ([place])   . Diabetes mellitus without complication ([place])   . Hashimoto's thyroiditis   . Hyperlipidemia   . Hypertension   . Secondary adrenal insufficiency ([place])   . Thyroid disease   . Tobacco abuse     Past Surgical History:  Procedure Laterality Date  . ABDOMINAL SURGERY    . CESAREAN SECTION    . CHOLECYSTECTOMY    . COLON RESECTION    . HERNIA MESH REMOVAL    . HERNIA REPAIR    . LEFT HEART CATH AND CORONARY ANGIOGRAPHY N/A 10/13/2016   Procedure: Left Heart Cath and Coronary Angiography;  Surgeon: Nelva Bush, MD;   Location: [facility];  Service: Cardiovascular;  Laterality: N/A;  . SHOULDER ARTHROSCOPY    . TUBAL LIGATION      Current Medications: Current Meds  Medication Sig  . aspirin EC 81 MG tablet Take 81 mg by mouth daily.   Marland Kitchen atorvastatin (LIPITOR) 20 MG tablet Take 20 mg by mouth daily.  . benzonatate (TESSALON) 200 MG capsule Take 200 mg by mouth 3 (three) times daily as needed for cough.  . carvedilol (COREG) 3.125 MG tablet Take 3.125 mg by mouth 2 (two) times daily with a meal.  . Cholecalciferol (VITAMIN D3) 2000 units capsule Take 2,000 Units by mouth daily.   . clopidogrel (PLAVIX) 75 MG tablet Take 75 mg by mouth daily.  . DULoxetine (CYMBALTA) 60 MG capsule take one 60mg  capsule  . furosemide (LASIX) 40 MG tablet Take 1 tablet (40 mg total) by mouth 2 (two) times daily. (Patient taking differently: Take 20 mg by mouth 2 (two) times daily. )  . hydrocortisone (CORTEF) 5 MG tablet Take 5-10 mg by mouth See admin instructions. 10 mg in the morning then 5 mg midday  . insulin degludec (TRESIBA FLEXTOUCH) 100 UNIT/ML SOPN FlexTouch Pen Inject 28 Units into the skin at bedtime.   . Iron-FA-B Cmp-C-Biot-Probiotic (FUSION PLUS) CAPS Take 1 capsule by mouth daily.  . isosorbide mononitrate (IMDUR) 60 MG 24  hr tablet Take 1 tablet (60 mg total) by mouth daily.  Marland Kitchen ketoconazole (NIZORAL) 2 % cream Apply 1 fingertip amount to toenails daily  . levothyroxine (SYNTHROID) 125 MCG tablet Take 125 mcg by mouth daily.   Marland Kitchen lidocaine (XYLOCAINE) 5 % ointment Apply 1 application topically as needed (for neck pain).   . Multiple Vitamins-Minerals (EMERGEN-C IMMUNE PO) Take 1 capsule by mouth daily. CHEW  . omeprazole (PRILOSEC) 40 MG capsule Take 40 mg by mouth daily.  . potassium chloride SA (K-DUR,KLOR-CON) 20 MEQ tablet Take 1 tablet (20 mEq total) by mouth daily.  . pramipexole (MIRAPEX) 1 MG tablet Take 1 mg by mouth 2 (two) times daily.  . sacubitril-valsartan (ENTRESTO) 24-26 MG Take 1  tablet by mouth 2 (two) times daily.  Marland Kitchen topiramate (TOPAMAX) 100 MG tablet Take 300 mg by mouth at bedtime.  . traZODone (DESYREL) 50 MG tablet Take 50 mg by mouth at bedtime.   . TRULICITY 1.5 LG/9.2JJ SOPN Inject 1.5 mg into the skin every 7 (seven) days.  Marland Kitchen umeclidinium-vilanterol (ANORO ELLIPTA) 62.5-25 MCG/INH AEPB Inhale 1 puff into the lungs daily.     Allergies:   Hydroxychloroquine; Donepezil; Prednisone; Bupropion; Metrizamide; Varenicline; and Tape   Social History   Socioeconomic History  . Marital status: Married    Spouse name: Not on file  . Number of children: Not on file  . Years of education: Not on file  . Highest education level: Not on file  Social Needs  . Financial resource strain: Not on file  . Food insecurity - worry: Not on file  . Food insecurity - inability: Not on file  . Transportation needs - medical: Not on file  . Transportation needs - non-medical: Not on file  Occupational History  . Not on file  Tobacco Use  . Smoking status: Current Every Day Smoker    Types: Cigarettes  . Smokeless tobacco: Never Used  Substance and Sexual Activity  . Alcohol use: Yes  . Drug use: No  . Sexual activity: Not on file  Other Topics Concern  . Not on file  Social History Narrative  . Not on file     Family History: The patient's family history includes Diabetes in her father, sister, sister, and son; Stroke in her mother. ROS:   Please see the history of present illness.    All 14 point review of systems negative except as described per history of present illness  EKGs/Labs/Other Studies Reviewed:      Recent Labs: 01/25/2017: ALT 15 03/02/2017: NT-Pro BNP 615 03/26/2017: B Natriuretic Peptide 63.7; BUN 28; Creatinine, Ser 1.24; Hemoglobin 12.3; Platelets 216; Potassium 3.8; Sodium 136  Recent Lipid Panel    Component Value Date/Time   CHOL 122 10/13/2016 0037   TRIG 68 10/13/2016 0037   HDL 34 (L) 10/13/2016 0037   CHOLHDL 3.6 10/13/2016  0037   VLDL 14 10/13/2016 0037   LDLCALC 74 10/13/2016 0037    Physical Exam:    VS:  BP 116/72 (BP Location: Right Arm, Patient Position: Sitting, Cuff Size: Large)   Pulse 100   Ht 5\' 2"  (1.575 m)   Wt 218 lb 0.6 oz (98.9 kg)   SpO2 95%   BMI 39.88 kg/m     Wt Readings from Last 3 Encounters:  04/04/17 218 lb 0.6 oz (98.9 kg)  03/26/17 220 lb (99.8 kg)  03/23/17 219 lb (99.3 kg)     GEN:  Well nourished, well developed in no acute distress  HEENT: Normal NECK: No JVD; No carotid bruits LYMPHATICS: No lymphadenopathy CARDIAC: RRR, no murmurs, no rubs, no gallops RESPIRATORY:  Clear to auscultation without rales, wheezing or rhonchi poor air entry bilaterally ABDOMEN: Soft, non-tender, non-distended MUSCULOSKELETAL:  No edema; No deformity  SKIN: Warm and dry LOWER EXTREMITIES: no swelling NEUROLOGIC:  Alert and oriented x 3 PSYCHIATRIC:  Normal affect   ASSESSMENT:    1. Ischemic cardiomyopathy   2. Chronic combined systolic and diastolic CHF (congestive heart failure) ([place])   3. Essential hypertension    PLAN:    In order of problems listed above:  1. Ischemic myopathy y: On optimal therapy.  Limitations are blood pressure being low, kidney function. 2. Combined systolic and diastolic congestive heart failure: ProBNP normal.  I did not see evidence of significant fluid overload right now.  She tells me she can lay flat with no difficulties. 3. Essential hypertension: Blood pressure well controlled.  Operative test reviewed from endocrinologist.  Of course we spoke about her sugar being high she understands she would like to get some new glucometer to check her sugar on the regular basis.  Again we talked about her potentially having surgery I advised her to talk to her surgeon.  From cardiac standpoint review I think we will reached optimal.  But overall I would consider her as a poor candidate for surgery because of comorbidities.  Definitely needs to be evaluated  before surgery by anesthesiologist.   Medication Adjustments/Labs and Tests Ordered: Current medicines are reviewed at length with the patient today.  Concerns regarding medicines are outlined above.  No orders of the defined types were placed in this encounter.  Medication changes: No orders of the defined types were placed in this encounter.   Signed, Park Liter, MD, [facility] 04/04/2017 3:52 PM    [facility]

## 2017-04-06 ENCOUNTER — Ambulatory Visit: Payer: Federal, State, Local not specified - PPO | Admitting: Cardiology

## 2017-04-12 DIAGNOSIS — K432 Incisional hernia without obstruction or gangrene: Secondary | ICD-10-CM | POA: Diagnosis not present

## 2017-04-16 DIAGNOSIS — F17210 Nicotine dependence, cigarettes, uncomplicated: Secondary | ICD-10-CM | POA: Diagnosis not present

## 2017-04-16 DIAGNOSIS — R0602 Shortness of breath: Secondary | ICD-10-CM | POA: Diagnosis not present

## 2017-04-16 DIAGNOSIS — J180 Bronchopneumonia, unspecified organism: Secondary | ICD-10-CM | POA: Diagnosis not present

## 2017-04-16 DIAGNOSIS — E1165 Type 2 diabetes mellitus with hyperglycemia: Secondary | ICD-10-CM | POA: Diagnosis not present

## 2017-04-16 DIAGNOSIS — R05 Cough: Secondary | ICD-10-CM | POA: Diagnosis not present

## 2017-04-18 DIAGNOSIS — Z794 Long term (current) use of insulin: Secondary | ICD-10-CM | POA: Diagnosis not present

## 2017-04-18 DIAGNOSIS — I2510 Atherosclerotic heart disease of native coronary artery without angina pectoris: Secondary | ICD-10-CM | POA: Diagnosis not present

## 2017-04-18 DIAGNOSIS — N183 Chronic kidney disease, stage 3 (moderate): Secondary | ICD-10-CM | POA: Diagnosis not present

## 2017-04-18 DIAGNOSIS — F17210 Nicotine dependence, cigarettes, uncomplicated: Secondary | ICD-10-CM | POA: Diagnosis not present

## 2017-04-18 DIAGNOSIS — J121 Respiratory syncytial virus pneumonia: Secondary | ICD-10-CM | POA: Diagnosis not present

## 2017-04-18 DIAGNOSIS — E2740 Unspecified adrenocortical insufficiency: Secondary | ICD-10-CM | POA: Diagnosis not present

## 2017-04-18 DIAGNOSIS — G588 Other specified mononeuropathies: Secondary | ICD-10-CM | POA: Diagnosis not present

## 2017-04-18 DIAGNOSIS — E1142 Type 2 diabetes mellitus with diabetic polyneuropathy: Secondary | ICD-10-CM | POA: Diagnosis not present

## 2017-04-18 DIAGNOSIS — R0602 Shortness of breath: Secondary | ICD-10-CM | POA: Diagnosis not present

## 2017-04-18 DIAGNOSIS — E1122 Type 2 diabetes mellitus with diabetic chronic kidney disease: Secondary | ICD-10-CM | POA: Diagnosis not present

## 2017-04-18 DIAGNOSIS — M3500 Sicca syndrome, unspecified: Secondary | ICD-10-CM | POA: Diagnosis not present

## 2017-04-18 DIAGNOSIS — J441 Chronic obstructive pulmonary disease with (acute) exacerbation: Secondary | ICD-10-CM | POA: Diagnosis not present

## 2017-04-18 DIAGNOSIS — I5022 Chronic systolic (congestive) heart failure: Secondary | ICD-10-CM | POA: Diagnosis not present

## 2017-04-18 DIAGNOSIS — R062 Wheezing: Secondary | ICD-10-CM | POA: Diagnosis not present

## 2017-04-19 DIAGNOSIS — Z794 Long term (current) use of insulin: Secondary | ICD-10-CM | POA: Diagnosis not present

## 2017-04-19 DIAGNOSIS — E119 Type 2 diabetes mellitus without complications: Secondary | ICD-10-CM | POA: Diagnosis not present

## 2017-04-19 DIAGNOSIS — J441 Chronic obstructive pulmonary disease with (acute) exacerbation: Secondary | ICD-10-CM | POA: Diagnosis not present

## 2017-04-19 DIAGNOSIS — E2740 Unspecified adrenocortical insufficiency: Secondary | ICD-10-CM | POA: Diagnosis not present

## 2017-04-20 DIAGNOSIS — Z794 Long term (current) use of insulin: Secondary | ICD-10-CM | POA: Diagnosis not present

## 2017-04-20 DIAGNOSIS — J441 Chronic obstructive pulmonary disease with (acute) exacerbation: Secondary | ICD-10-CM | POA: Diagnosis not present

## 2017-04-20 DIAGNOSIS — E2740 Unspecified adrenocortical insufficiency: Secondary | ICD-10-CM | POA: Diagnosis not present

## 2017-04-20 DIAGNOSIS — E119 Type 2 diabetes mellitus without complications: Secondary | ICD-10-CM | POA: Diagnosis not present

## 2017-04-21 DIAGNOSIS — J441 Chronic obstructive pulmonary disease with (acute) exacerbation: Secondary | ICD-10-CM | POA: Diagnosis not present

## 2017-04-21 DIAGNOSIS — M3500 Sicca syndrome, unspecified: Secondary | ICD-10-CM | POA: Diagnosis not present

## 2017-04-21 DIAGNOSIS — J121 Respiratory syncytial virus pneumonia: Secondary | ICD-10-CM | POA: Insufficient documentation

## 2017-04-21 DIAGNOSIS — J210 Acute bronchiolitis due to respiratory syncytial virus: Secondary | ICD-10-CM | POA: Diagnosis not present

## 2017-04-21 DIAGNOSIS — I2510 Atherosclerotic heart disease of native coronary artery without angina pectoris: Secondary | ICD-10-CM | POA: Diagnosis not present

## 2017-04-21 DIAGNOSIS — J40 Bronchitis, not specified as acute or chronic: Secondary | ICD-10-CM | POA: Diagnosis not present

## 2017-04-21 DIAGNOSIS — E119 Type 2 diabetes mellitus without complications: Secondary | ICD-10-CM | POA: Diagnosis not present

## 2017-04-21 DIAGNOSIS — E2740 Unspecified adrenocortical insufficiency: Secondary | ICD-10-CM | POA: Diagnosis not present

## 2017-04-22 DIAGNOSIS — J441 Chronic obstructive pulmonary disease with (acute) exacerbation: Secondary | ICD-10-CM | POA: Diagnosis not present

## 2017-04-22 DIAGNOSIS — E2740 Unspecified adrenocortical insufficiency: Secondary | ICD-10-CM | POA: Diagnosis not present

## 2017-04-22 DIAGNOSIS — I2510 Atherosclerotic heart disease of native coronary artery without angina pectoris: Secondary | ICD-10-CM | POA: Diagnosis not present

## 2017-04-22 DIAGNOSIS — J210 Acute bronchiolitis due to respiratory syncytial virus: Secondary | ICD-10-CM | POA: Diagnosis not present

## 2017-04-22 DIAGNOSIS — M3500 Sicca syndrome, unspecified: Secondary | ICD-10-CM | POA: Diagnosis not present

## 2017-04-22 DIAGNOSIS — E119 Type 2 diabetes mellitus without complications: Secondary | ICD-10-CM | POA: Diagnosis not present

## 2017-04-23 DIAGNOSIS — J210 Acute bronchiolitis due to respiratory syncytial virus: Secondary | ICD-10-CM | POA: Diagnosis not present

## 2017-04-23 DIAGNOSIS — J441 Chronic obstructive pulmonary disease with (acute) exacerbation: Secondary | ICD-10-CM | POA: Diagnosis not present

## 2017-04-23 DIAGNOSIS — E2740 Unspecified adrenocortical insufficiency: Secondary | ICD-10-CM | POA: Diagnosis not present

## 2017-04-23 DIAGNOSIS — M3500 Sicca syndrome, unspecified: Secondary | ICD-10-CM | POA: Diagnosis not present

## 2017-04-24 DIAGNOSIS — E119 Type 2 diabetes mellitus without complications: Secondary | ICD-10-CM | POA: Diagnosis not present

## 2017-04-24 DIAGNOSIS — J210 Acute bronchiolitis due to respiratory syncytial virus: Secondary | ICD-10-CM | POA: Diagnosis not present

## 2017-04-24 DIAGNOSIS — M3500 Sicca syndrome, unspecified: Secondary | ICD-10-CM | POA: Diagnosis not present

## 2017-04-24 DIAGNOSIS — J441 Chronic obstructive pulmonary disease with (acute) exacerbation: Secondary | ICD-10-CM | POA: Diagnosis not present

## 2017-04-24 DIAGNOSIS — E2740 Unspecified adrenocortical insufficiency: Secondary | ICD-10-CM | POA: Diagnosis not present

## 2017-04-24 DIAGNOSIS — I2510 Atherosclerotic heart disease of native coronary artery without angina pectoris: Secondary | ICD-10-CM | POA: Diagnosis not present

## 2017-04-25 DIAGNOSIS — Z794 Long term (current) use of insulin: Secondary | ICD-10-CM | POA: Diagnosis not present

## 2017-04-25 DIAGNOSIS — I2510 Atherosclerotic heart disease of native coronary artery without angina pectoris: Secondary | ICD-10-CM | POA: Diagnosis not present

## 2017-04-25 DIAGNOSIS — J441 Chronic obstructive pulmonary disease with (acute) exacerbation: Secondary | ICD-10-CM | POA: Diagnosis not present

## 2017-04-25 DIAGNOSIS — E119 Type 2 diabetes mellitus without complications: Secondary | ICD-10-CM | POA: Diagnosis not present

## 2017-04-26 DIAGNOSIS — J449 Chronic obstructive pulmonary disease, unspecified: Secondary | ICD-10-CM | POA: Diagnosis not present

## 2017-05-02 DIAGNOSIS — Z6841 Body Mass Index (BMI) 40.0 and over, adult: Secondary | ICD-10-CM | POA: Diagnosis not present

## 2017-05-02 DIAGNOSIS — E1151 Type 2 diabetes mellitus with diabetic peripheral angiopathy without gangrene: Secondary | ICD-10-CM | POA: Diagnosis not present

## 2017-05-02 DIAGNOSIS — R05 Cough: Secondary | ICD-10-CM | POA: Diagnosis not present

## 2017-05-02 DIAGNOSIS — Z7189 Other specified counseling: Secondary | ICD-10-CM | POA: Diagnosis not present

## 2017-05-05 DIAGNOSIS — J9610 Chronic respiratory failure, unspecified whether with hypoxia or hypercapnia: Secondary | ICD-10-CM | POA: Diagnosis not present

## 2017-05-05 DIAGNOSIS — I2510 Atherosclerotic heart disease of native coronary artery without angina pectoris: Secondary | ICD-10-CM | POA: Diagnosis not present

## 2017-05-05 DIAGNOSIS — G4733 Obstructive sleep apnea (adult) (pediatric): Secondary | ICD-10-CM | POA: Diagnosis not present

## 2017-05-05 DIAGNOSIS — R0789 Other chest pain: Secondary | ICD-10-CM | POA: Diagnosis not present

## 2017-05-05 DIAGNOSIS — J181 Lobar pneumonia, unspecified organism: Secondary | ICD-10-CM | POA: Diagnosis not present

## 2017-05-05 DIAGNOSIS — I130 Hypertensive heart and chronic kidney disease with heart failure and stage 1 through stage 4 chronic kidney disease, or unspecified chronic kidney disease: Secondary | ICD-10-CM | POA: Diagnosis not present

## 2017-05-05 DIAGNOSIS — R918 Other nonspecific abnormal finding of lung field: Secondary | ICD-10-CM | POA: Diagnosis not present

## 2017-05-05 DIAGNOSIS — J440 Chronic obstructive pulmonary disease with acute lower respiratory infection: Secondary | ICD-10-CM | POA: Diagnosis not present

## 2017-05-05 DIAGNOSIS — I5023 Acute on chronic systolic (congestive) heart failure: Secondary | ICD-10-CM | POA: Diagnosis not present

## 2017-05-05 DIAGNOSIS — I110 Hypertensive heart disease with heart failure: Secondary | ICD-10-CM | POA: Diagnosis not present

## 2017-05-05 DIAGNOSIS — I700 Atherosclerosis of aorta: Secondary | ICD-10-CM | POA: Diagnosis not present

## 2017-05-05 DIAGNOSIS — F17210 Nicotine dependence, cigarettes, uncomplicated: Secondary | ICD-10-CM | POA: Diagnosis not present

## 2017-05-05 DIAGNOSIS — R069 Unspecified abnormalities of breathing: Secondary | ICD-10-CM | POA: Diagnosis not present

## 2017-05-05 DIAGNOSIS — J441 Chronic obstructive pulmonary disease with (acute) exacerbation: Secondary | ICD-10-CM | POA: Diagnosis not present

## 2017-05-05 DIAGNOSIS — Z955 Presence of coronary angioplasty implant and graft: Secondary | ICD-10-CM | POA: Diagnosis not present

## 2017-05-05 DIAGNOSIS — I429 Cardiomyopathy, unspecified: Secondary | ICD-10-CM | POA: Diagnosis not present

## 2017-05-05 DIAGNOSIS — I5022 Chronic systolic (congestive) heart failure: Secondary | ICD-10-CM | POA: Diagnosis not present

## 2017-05-05 DIAGNOSIS — D72829 Elevated white blood cell count, unspecified: Secondary | ICD-10-CM | POA: Diagnosis not present

## 2017-05-05 DIAGNOSIS — Z9981 Dependence on supplemental oxygen: Secondary | ICD-10-CM | POA: Diagnosis not present

## 2017-05-05 DIAGNOSIS — E2740 Unspecified adrenocortical insufficiency: Secondary | ICD-10-CM | POA: Diagnosis not present

## 2017-05-05 DIAGNOSIS — E1165 Type 2 diabetes mellitus with hyperglycemia: Secondary | ICD-10-CM | POA: Diagnosis not present

## 2017-05-06 DIAGNOSIS — R0600 Dyspnea, unspecified: Secondary | ICD-10-CM | POA: Diagnosis not present

## 2017-05-06 DIAGNOSIS — I700 Atherosclerosis of aorta: Secondary | ICD-10-CM | POA: Diagnosis not present

## 2017-05-06 DIAGNOSIS — J441 Chronic obstructive pulmonary disease with (acute) exacerbation: Secondary | ICD-10-CM | POA: Diagnosis not present

## 2017-05-06 DIAGNOSIS — J181 Lobar pneumonia, unspecified organism: Secondary | ICD-10-CM

## 2017-05-06 DIAGNOSIS — J189 Pneumonia, unspecified organism: Secondary | ICD-10-CM | POA: Insufficient documentation

## 2017-05-06 DIAGNOSIS — J449 Chronic obstructive pulmonary disease, unspecified: Secondary | ICD-10-CM | POA: Diagnosis not present

## 2017-05-06 DIAGNOSIS — J440 Chronic obstructive pulmonary disease with acute lower respiratory infection: Secondary | ICD-10-CM | POA: Diagnosis not present

## 2017-05-06 DIAGNOSIS — Z9981 Dependence on supplemental oxygen: Secondary | ICD-10-CM | POA: Diagnosis not present

## 2017-05-06 DIAGNOSIS — J9610 Chronic respiratory failure, unspecified whether with hypoxia or hypercapnia: Secondary | ICD-10-CM | POA: Diagnosis not present

## 2017-05-07 DIAGNOSIS — Z9981 Dependence on supplemental oxygen: Secondary | ICD-10-CM | POA: Diagnosis not present

## 2017-05-07 DIAGNOSIS — J440 Chronic obstructive pulmonary disease with acute lower respiratory infection: Secondary | ICD-10-CM | POA: Diagnosis not present

## 2017-05-07 DIAGNOSIS — J181 Lobar pneumonia, unspecified organism: Secondary | ICD-10-CM | POA: Diagnosis not present

## 2017-05-07 DIAGNOSIS — F17210 Nicotine dependence, cigarettes, uncomplicated: Secondary | ICD-10-CM | POA: Diagnosis not present

## 2017-05-07 DIAGNOSIS — J441 Chronic obstructive pulmonary disease with (acute) exacerbation: Secondary | ICD-10-CM | POA: Diagnosis not present

## 2017-05-08 DIAGNOSIS — J441 Chronic obstructive pulmonary disease with (acute) exacerbation: Secondary | ICD-10-CM | POA: Diagnosis not present

## 2017-05-08 DIAGNOSIS — I2510 Atherosclerotic heart disease of native coronary artery without angina pectoris: Secondary | ICD-10-CM | POA: Diagnosis not present

## 2017-05-08 DIAGNOSIS — J440 Chronic obstructive pulmonary disease with acute lower respiratory infection: Secondary | ICD-10-CM | POA: Diagnosis not present

## 2017-05-08 DIAGNOSIS — I429 Cardiomyopathy, unspecified: Secondary | ICD-10-CM | POA: Diagnosis not present

## 2017-05-08 DIAGNOSIS — I110 Hypertensive heart disease with heart failure: Secondary | ICD-10-CM | POA: Diagnosis not present

## 2017-05-08 DIAGNOSIS — J181 Lobar pneumonia, unspecified organism: Secondary | ICD-10-CM | POA: Diagnosis not present

## 2017-05-09 DIAGNOSIS — R5383 Other fatigue: Secondary | ICD-10-CM | POA: Diagnosis not present

## 2017-05-09 DIAGNOSIS — J449 Chronic obstructive pulmonary disease, unspecified: Secondary | ICD-10-CM | POA: Diagnosis not present

## 2017-05-09 DIAGNOSIS — J310 Chronic rhinitis: Secondary | ICD-10-CM | POA: Diagnosis not present

## 2017-05-09 DIAGNOSIS — F17210 Nicotine dependence, cigarettes, uncomplicated: Secondary | ICD-10-CM | POA: Diagnosis not present

## 2017-05-09 DIAGNOSIS — R911 Solitary pulmonary nodule: Secondary | ICD-10-CM | POA: Diagnosis not present

## 2017-05-09 DIAGNOSIS — G4733 Obstructive sleep apnea (adult) (pediatric): Secondary | ICD-10-CM | POA: Diagnosis not present

## 2017-05-10 DIAGNOSIS — N3946 Mixed incontinence: Secondary | ICD-10-CM | POA: Diagnosis not present

## 2017-05-11 DIAGNOSIS — J441 Chronic obstructive pulmonary disease with (acute) exacerbation: Secondary | ICD-10-CM | POA: Diagnosis not present

## 2017-05-16 DIAGNOSIS — Z139 Encounter for screening, unspecified: Secondary | ICD-10-CM | POA: Diagnosis not present

## 2017-05-16 DIAGNOSIS — J449 Chronic obstructive pulmonary disease, unspecified: Secondary | ICD-10-CM | POA: Diagnosis not present

## 2017-05-16 DIAGNOSIS — R42 Dizziness and giddiness: Secondary | ICD-10-CM | POA: Diagnosis not present

## 2017-05-16 DIAGNOSIS — Z1331 Encounter for screening for depression: Secondary | ICD-10-CM | POA: Diagnosis not present

## 2017-05-18 DIAGNOSIS — G3184 Mild cognitive impairment, so stated: Secondary | ICD-10-CM | POA: Diagnosis not present

## 2017-05-18 DIAGNOSIS — Z7902 Long term (current) use of antithrombotics/antiplatelets: Secondary | ICD-10-CM | POA: Diagnosis not present

## 2017-05-18 DIAGNOSIS — E2740 Unspecified adrenocortical insufficiency: Secondary | ICD-10-CM | POA: Diagnosis not present

## 2017-05-18 DIAGNOSIS — I2510 Atherosclerotic heart disease of native coronary artery without angina pectoris: Secondary | ICD-10-CM | POA: Diagnosis not present

## 2017-05-18 DIAGNOSIS — I5022 Chronic systolic (congestive) heart failure: Secondary | ICD-10-CM | POA: Diagnosis not present

## 2017-05-18 DIAGNOSIS — I110 Hypertensive heart disease with heart failure: Secondary | ICD-10-CM | POA: Diagnosis not present

## 2017-05-18 DIAGNOSIS — F329 Major depressive disorder, single episode, unspecified: Secondary | ICD-10-CM | POA: Diagnosis not present

## 2017-05-18 DIAGNOSIS — E063 Autoimmune thyroiditis: Secondary | ICD-10-CM | POA: Diagnosis not present

## 2017-05-18 DIAGNOSIS — E1142 Type 2 diabetes mellitus with diabetic polyneuropathy: Secondary | ICD-10-CM | POA: Diagnosis not present

## 2017-05-18 DIAGNOSIS — J441 Chronic obstructive pulmonary disease with (acute) exacerbation: Secondary | ICD-10-CM | POA: Diagnosis not present

## 2017-05-18 DIAGNOSIS — J189 Pneumonia, unspecified organism: Secondary | ICD-10-CM | POA: Diagnosis not present

## 2017-05-18 DIAGNOSIS — I429 Cardiomyopathy, unspecified: Secondary | ICD-10-CM | POA: Diagnosis not present

## 2017-05-18 DIAGNOSIS — G4730 Sleep apnea, unspecified: Secondary | ICD-10-CM | POA: Diagnosis not present

## 2017-05-18 DIAGNOSIS — M5136 Other intervertebral disc degeneration, lumbar region: Secondary | ICD-10-CM | POA: Diagnosis not present

## 2017-05-18 DIAGNOSIS — J440 Chronic obstructive pulmonary disease with acute lower respiratory infection: Secondary | ICD-10-CM | POA: Diagnosis not present

## 2017-05-18 DIAGNOSIS — M1990 Unspecified osteoarthritis, unspecified site: Secondary | ICD-10-CM | POA: Diagnosis not present

## 2017-05-25 DIAGNOSIS — E2740 Unspecified adrenocortical insufficiency: Secondary | ICD-10-CM | POA: Diagnosis not present

## 2017-05-25 DIAGNOSIS — E1142 Type 2 diabetes mellitus with diabetic polyneuropathy: Secondary | ICD-10-CM | POA: Diagnosis not present

## 2017-05-25 DIAGNOSIS — J441 Chronic obstructive pulmonary disease with (acute) exacerbation: Secondary | ICD-10-CM | POA: Diagnosis not present

## 2017-05-25 DIAGNOSIS — M1990 Unspecified osteoarthritis, unspecified site: Secondary | ICD-10-CM | POA: Diagnosis not present

## 2017-05-25 DIAGNOSIS — E063 Autoimmune thyroiditis: Secondary | ICD-10-CM | POA: Diagnosis not present

## 2017-05-25 DIAGNOSIS — J440 Chronic obstructive pulmonary disease with acute lower respiratory infection: Secondary | ICD-10-CM | POA: Diagnosis not present

## 2017-05-25 DIAGNOSIS — I110 Hypertensive heart disease with heart failure: Secondary | ICD-10-CM | POA: Diagnosis not present

## 2017-05-25 DIAGNOSIS — G3184 Mild cognitive impairment, so stated: Secondary | ICD-10-CM | POA: Diagnosis not present

## 2017-05-25 DIAGNOSIS — I2510 Atherosclerotic heart disease of native coronary artery without angina pectoris: Secondary | ICD-10-CM | POA: Diagnosis not present

## 2017-05-25 DIAGNOSIS — G4730 Sleep apnea, unspecified: Secondary | ICD-10-CM | POA: Diagnosis not present

## 2017-05-25 DIAGNOSIS — M5136 Other intervertebral disc degeneration, lumbar region: Secondary | ICD-10-CM | POA: Diagnosis not present

## 2017-05-25 DIAGNOSIS — F329 Major depressive disorder, single episode, unspecified: Secondary | ICD-10-CM | POA: Diagnosis not present

## 2017-05-25 DIAGNOSIS — J449 Chronic obstructive pulmonary disease, unspecified: Secondary | ICD-10-CM | POA: Diagnosis not present

## 2017-05-25 DIAGNOSIS — I5022 Chronic systolic (congestive) heart failure: Secondary | ICD-10-CM | POA: Diagnosis not present

## 2017-05-25 DIAGNOSIS — I429 Cardiomyopathy, unspecified: Secondary | ICD-10-CM | POA: Diagnosis not present

## 2017-05-25 DIAGNOSIS — Z7902 Long term (current) use of antithrombotics/antiplatelets: Secondary | ICD-10-CM | POA: Diagnosis not present

## 2017-05-25 DIAGNOSIS — J189 Pneumonia, unspecified organism: Secondary | ICD-10-CM | POA: Diagnosis not present

## 2017-05-26 ENCOUNTER — Telehealth: Payer: Self-pay | Admitting: Cardiology

## 2017-05-26 NOTE — Telephone Encounter (Signed)
Spoke with patient, she states she has recently been admitted to [facility] twice due to RSV and Pneumonia. She states that she has been having dizziness, fatigue and shortness of breath, which she has told Dr. Agustin Cree about. Her PCP has thought it was her topiramate, but she has talked to a nurse from [facility] who believes she may have Afib. I advised I would discuss this and give her a call back.

## 2017-05-26 NOTE — Telephone Encounter (Signed)
Left voicemail about discussing making a nurse visit for an EKG

## 2017-05-26 NOTE — Telephone Encounter (Signed)
Patient needs a phone call, says she is having side effects and primary doctor told her to call us.

## 2017-05-29 ENCOUNTER — Ambulatory Visit (INDEPENDENT_AMBULATORY_CARE_PROVIDER_SITE_OTHER): Payer: Federal, State, Local not specified - PPO | Admitting: *Deleted

## 2017-05-29 DIAGNOSIS — R002 Palpitations: Secondary | ICD-10-CM

## 2017-05-29 NOTE — Progress Notes (Signed)
Patient came in for nurse visit for EKG. EKG reviewed by Dr. Bettina Gavia. No changes recommended. Advised patient should follow up with PCP and could schedule follow up with Dr. Agustin Cree if needed. Patient verbalized understanding. No further questions.

## 2017-05-29 NOTE — Addendum Note (Signed)
Addended by: Warner Mccreedy E on: 05/29/2017 02:42 PM   Modules accepted: Orders

## 2017-05-31 DIAGNOSIS — K432 Incisional hernia without obstruction or gangrene: Secondary | ICD-10-CM | POA: Diagnosis not present

## 2017-06-05 DIAGNOSIS — N3946 Mixed incontinence: Secondary | ICD-10-CM | POA: Diagnosis not present

## 2017-06-06 DIAGNOSIS — J449 Chronic obstructive pulmonary disease, unspecified: Secondary | ICD-10-CM | POA: Diagnosis not present

## 2017-06-12 DIAGNOSIS — E063 Autoimmune thyroiditis: Secondary | ICD-10-CM | POA: Diagnosis not present

## 2017-06-12 DIAGNOSIS — E7800 Pure hypercholesterolemia, unspecified: Secondary | ICD-10-CM | POA: Diagnosis not present

## 2017-06-12 DIAGNOSIS — I5042 Chronic combined systolic (congestive) and diastolic (congestive) heart failure: Secondary | ICD-10-CM | POA: Diagnosis not present

## 2017-06-12 DIAGNOSIS — M1990 Unspecified osteoarthritis, unspecified site: Secondary | ICD-10-CM | POA: Diagnosis not present

## 2017-06-12 DIAGNOSIS — I130 Hypertensive heart and chronic kidney disease with heart failure and stage 1 through stage 4 chronic kidney disease, or unspecified chronic kidney disease: Secondary | ICD-10-CM | POA: Diagnosis not present

## 2017-06-12 DIAGNOSIS — I2510 Atherosclerotic heart disease of native coronary artery without angina pectoris: Secondary | ICD-10-CM | POA: Diagnosis not present

## 2017-06-12 DIAGNOSIS — K432 Incisional hernia without obstruction or gangrene: Secondary | ICD-10-CM | POA: Diagnosis not present

## 2017-06-12 DIAGNOSIS — G8918 Other acute postprocedural pain: Secondary | ICD-10-CM | POA: Diagnosis not present

## 2017-06-12 DIAGNOSIS — E1122 Type 2 diabetes mellitus with diabetic chronic kidney disease: Secondary | ICD-10-CM | POA: Diagnosis not present

## 2017-06-12 DIAGNOSIS — E1140 Type 2 diabetes mellitus with diabetic neuropathy, unspecified: Secondary | ICD-10-CM | POA: Diagnosis not present

## 2017-06-12 DIAGNOSIS — F17210 Nicotine dependence, cigarettes, uncomplicated: Secondary | ICD-10-CM | POA: Diagnosis not present

## 2017-06-12 DIAGNOSIS — E271 Primary adrenocortical insufficiency: Secondary | ICD-10-CM | POA: Diagnosis not present

## 2017-06-12 DIAGNOSIS — M3500 Sicca syndrome, unspecified: Secondary | ICD-10-CM | POA: Diagnosis not present

## 2017-06-12 DIAGNOSIS — J449 Chronic obstructive pulmonary disease, unspecified: Secondary | ICD-10-CM | POA: Diagnosis not present

## 2017-06-12 DIAGNOSIS — I255 Ischemic cardiomyopathy: Secondary | ICD-10-CM | POA: Diagnosis not present

## 2017-06-12 DIAGNOSIS — N183 Chronic kidney disease, stage 3 (moderate): Secondary | ICD-10-CM | POA: Diagnosis not present

## 2017-06-12 DIAGNOSIS — G4733 Obstructive sleep apnea (adult) (pediatric): Secondary | ICD-10-CM | POA: Diagnosis not present

## 2017-06-13 DIAGNOSIS — I130 Hypertensive heart and chronic kidney disease with heart failure and stage 1 through stage 4 chronic kidney disease, or unspecified chronic kidney disease: Secondary | ICD-10-CM | POA: Diagnosis not present

## 2017-06-13 DIAGNOSIS — I2510 Atherosclerotic heart disease of native coronary artery without angina pectoris: Secondary | ICD-10-CM | POA: Diagnosis not present

## 2017-06-13 DIAGNOSIS — G4733 Obstructive sleep apnea (adult) (pediatric): Secondary | ICD-10-CM | POA: Diagnosis not present

## 2017-06-13 DIAGNOSIS — M1990 Unspecified osteoarthritis, unspecified site: Secondary | ICD-10-CM | POA: Diagnosis not present

## 2017-06-13 DIAGNOSIS — E7800 Pure hypercholesterolemia, unspecified: Secondary | ICD-10-CM | POA: Diagnosis not present

## 2017-06-13 DIAGNOSIS — G8918 Other acute postprocedural pain: Secondary | ICD-10-CM | POA: Diagnosis not present

## 2017-06-13 DIAGNOSIS — E271 Primary adrenocortical insufficiency: Secondary | ICD-10-CM | POA: Diagnosis not present

## 2017-06-13 DIAGNOSIS — J449 Chronic obstructive pulmonary disease, unspecified: Secondary | ICD-10-CM | POA: Diagnosis not present

## 2017-06-13 DIAGNOSIS — E1140 Type 2 diabetes mellitus with diabetic neuropathy, unspecified: Secondary | ICD-10-CM | POA: Diagnosis not present

## 2017-06-13 DIAGNOSIS — E063 Autoimmune thyroiditis: Secondary | ICD-10-CM | POA: Diagnosis not present

## 2017-06-13 DIAGNOSIS — E1122 Type 2 diabetes mellitus with diabetic chronic kidney disease: Secondary | ICD-10-CM | POA: Diagnosis not present

## 2017-06-13 DIAGNOSIS — K432 Incisional hernia without obstruction or gangrene: Secondary | ICD-10-CM | POA: Diagnosis not present

## 2017-06-13 DIAGNOSIS — N183 Chronic kidney disease, stage 3 (moderate): Secondary | ICD-10-CM | POA: Diagnosis not present

## 2017-06-13 DIAGNOSIS — I255 Ischemic cardiomyopathy: Secondary | ICD-10-CM | POA: Diagnosis not present

## 2017-06-13 DIAGNOSIS — M3500 Sicca syndrome, unspecified: Secondary | ICD-10-CM | POA: Diagnosis not present

## 2017-06-13 DIAGNOSIS — I5042 Chronic combined systolic (congestive) and diastolic (congestive) heart failure: Secondary | ICD-10-CM | POA: Diagnosis not present

## 2017-06-18 DIAGNOSIS — R0600 Dyspnea, unspecified: Secondary | ICD-10-CM | POA: Diagnosis not present

## 2017-06-18 DIAGNOSIS — J441 Chronic obstructive pulmonary disease with (acute) exacerbation: Secondary | ICD-10-CM | POA: Diagnosis not present

## 2017-06-18 DIAGNOSIS — F17210 Nicotine dependence, cigarettes, uncomplicated: Secondary | ICD-10-CM | POA: Diagnosis not present

## 2017-06-18 DIAGNOSIS — Z720 Tobacco use: Secondary | ICD-10-CM | POA: Diagnosis not present

## 2017-06-18 DIAGNOSIS — I700 Atherosclerosis of aorta: Secondary | ICD-10-CM | POA: Diagnosis not present

## 2017-06-18 DIAGNOSIS — R069 Unspecified abnormalities of breathing: Secondary | ICD-10-CM | POA: Diagnosis not present

## 2017-06-18 DIAGNOSIS — R531 Weakness: Secondary | ICD-10-CM | POA: Diagnosis not present

## 2017-06-18 DIAGNOSIS — R0602 Shortness of breath: Secondary | ICD-10-CM | POA: Diagnosis not present

## 2017-06-22 DIAGNOSIS — I10 Essential (primary) hypertension: Secondary | ICD-10-CM | POA: Diagnosis not present

## 2017-06-22 DIAGNOSIS — G43909 Migraine, unspecified, not intractable, without status migrainosus: Secondary | ICD-10-CM | POA: Diagnosis not present

## 2017-06-22 DIAGNOSIS — R269 Unspecified abnormalities of gait and mobility: Secondary | ICD-10-CM | POA: Diagnosis not present

## 2017-06-22 DIAGNOSIS — Z789 Other specified health status: Secondary | ICD-10-CM | POA: Diagnosis not present

## 2017-06-22 DIAGNOSIS — E1159 Type 2 diabetes mellitus with other circulatory complications: Secondary | ICD-10-CM | POA: Diagnosis not present

## 2017-06-23 DIAGNOSIS — J301 Allergic rhinitis due to pollen: Secondary | ICD-10-CM | POA: Diagnosis not present

## 2017-06-23 DIAGNOSIS — G4733 Obstructive sleep apnea (adult) (pediatric): Secondary | ICD-10-CM | POA: Diagnosis not present

## 2017-06-23 DIAGNOSIS — F17210 Nicotine dependence, cigarettes, uncomplicated: Secondary | ICD-10-CM | POA: Diagnosis not present

## 2017-06-23 DIAGNOSIS — J449 Chronic obstructive pulmonary disease, unspecified: Secondary | ICD-10-CM | POA: Diagnosis not present

## 2017-06-23 DIAGNOSIS — R0600 Dyspnea, unspecified: Secondary | ICD-10-CM | POA: Diagnosis not present

## 2017-06-24 DIAGNOSIS — J449 Chronic obstructive pulmonary disease, unspecified: Secondary | ICD-10-CM | POA: Diagnosis not present

## 2017-07-04 DIAGNOSIS — I2510 Atherosclerotic heart disease of native coronary artery without angina pectoris: Secondary | ICD-10-CM | POA: Diagnosis not present

## 2017-07-04 DIAGNOSIS — G43909 Migraine, unspecified, not intractable, without status migrainosus: Secondary | ICD-10-CM | POA: Diagnosis not present

## 2017-07-04 DIAGNOSIS — J301 Allergic rhinitis due to pollen: Secondary | ICD-10-CM | POA: Diagnosis not present

## 2017-07-04 DIAGNOSIS — R918 Other nonspecific abnormal finding of lung field: Secondary | ICD-10-CM | POA: Diagnosis not present

## 2017-07-04 DIAGNOSIS — I700 Atherosclerosis of aorta: Secondary | ICD-10-CM | POA: Diagnosis not present

## 2017-07-04 DIAGNOSIS — J449 Chronic obstructive pulmonary disease, unspecified: Secondary | ICD-10-CM | POA: Diagnosis not present

## 2017-07-04 DIAGNOSIS — R42 Dizziness and giddiness: Secondary | ICD-10-CM | POA: Diagnosis not present

## 2017-07-04 DIAGNOSIS — Z6841 Body Mass Index (BMI) 40.0 and over, adult: Secondary | ICD-10-CM | POA: Diagnosis not present

## 2017-07-05 DIAGNOSIS — J449 Chronic obstructive pulmonary disease, unspecified: Secondary | ICD-10-CM | POA: Diagnosis not present

## 2017-07-05 DIAGNOSIS — Z981 Arthrodesis status: Secondary | ICD-10-CM | POA: Diagnosis not present

## 2017-07-05 DIAGNOSIS — R911 Solitary pulmonary nodule: Secondary | ICD-10-CM | POA: Diagnosis not present

## 2017-07-05 DIAGNOSIS — M4802 Spinal stenosis, cervical region: Secondary | ICD-10-CM | POA: Diagnosis not present

## 2017-07-05 DIAGNOSIS — G4733 Obstructive sleep apnea (adult) (pediatric): Secondary | ICD-10-CM | POA: Diagnosis not present

## 2017-07-05 DIAGNOSIS — F17210 Nicotine dependence, cigarettes, uncomplicated: Secondary | ICD-10-CM | POA: Diagnosis not present

## 2017-07-06 DIAGNOSIS — M1712 Unilateral primary osteoarthritis, left knee: Secondary | ICD-10-CM | POA: Diagnosis not present

## 2017-07-07 ENCOUNTER — Encounter: Payer: Self-pay | Admitting: Cardiology

## 2017-07-07 ENCOUNTER — Ambulatory Visit (INDEPENDENT_AMBULATORY_CARE_PROVIDER_SITE_OTHER): Payer: Federal, State, Local not specified - PPO | Admitting: Cardiology

## 2017-07-07 VITALS — BP 128/82 | HR 98 | Ht 62.0 in | Wt 210.0 lb

## 2017-07-07 DIAGNOSIS — I2510 Atherosclerotic heart disease of native coronary artery without angina pectoris: Secondary | ICD-10-CM | POA: Diagnosis not present

## 2017-07-07 DIAGNOSIS — I10 Essential (primary) hypertension: Secondary | ICD-10-CM

## 2017-07-07 DIAGNOSIS — I5042 Chronic combined systolic (congestive) and diastolic (congestive) heart failure: Secondary | ICD-10-CM

## 2017-07-07 NOTE — Progress Notes (Signed)
Cardiology Office Note:    Date:  07/07/2017   ID:  Crystal Yates, DOB 1955-12-16, MRN [id number]  PCP:  Dortha Kern, PA  Cardiologist:  Jenne Campus, MD    Referring MD: Dortha Kern, [place]   Chief Complaint  Patient presents with  . Follow-up  . Congestive Heart Failure  Having shortness of breath  History of Present Illness:    Crystal Yates is a 62 y.o. female with multiple medical problems that include cardiomyopathy as well as coronary artery disease.  She is being not feeling well within last few weeks.  Her shortness of breath increase apparently some changes in her medication for her lung condition with made and she is started feeling better her weight is stable she gained only 4 pounds.  However I will check a proBNP as well as a BMP today to see if there is any room for improvement with diuretics.  At the same time she tells me when she gets up very quickly she will get dizzy.  No passing out. About a month ago she went to abdominal surgery with no difficulties doing well recovering.  Past Medical History:  Diagnosis Date  . CAD in native artery    a. Prior LAD stenting based on cath. b. RCA stenting 03/2016 x2.  . Chronic combined systolic and diastolic CHF (congestive heart failure) ([place])   . CKD (chronic kidney disease), stage II   . COPD (chronic obstructive pulmonary disease) ([place])   . Diabetes mellitus without complication ([place])   . Hashimoto's thyroiditis   . Hyperlipidemia   . Hypertension   . Secondary adrenal insufficiency ([place])   . Thyroid disease   . Tobacco abuse     Past Surgical History:  Procedure Laterality Date  . ABDOMINAL SURGERY    . CESAREAN SECTION    . CHOLECYSTECTOMY    . COLON RESECTION    . HERNIA MESH REMOVAL    . HERNIA REPAIR    . LEFT HEART CATH AND CORONARY ANGIOGRAPHY N/A 10/13/2016   Procedure: Left Heart Cath and Coronary Angiography;  Surgeon: Nelva Bush, MD;  Location: [place] [facility];  Service:  Cardiovascular;  Laterality: N/A;  . SHOULDER ARTHROSCOPY    . TUBAL LIGATION      Current Medications: Current Meds  Medication Sig  . aspirin EC 81 MG tablet Take by mouth.  Marland Kitchen atorvastatin (LIPITOR) 20 MG tablet Take 20 mg by mouth daily.  . benzonatate (TESSALON) 200 MG capsule Take 200 mg by mouth 3 (three) times daily as needed for cough.  . carvedilol (COREG) 3.125 MG tablet Take 3.125 mg by mouth 2 (two) times daily with a meal.  . Cholecalciferol (VITAMIN D3) 2000 units capsule Take 2,000 Units by mouth daily.   . clopidogrel (PLAVIX) 75 MG tablet Take 75 mg by mouth daily.  . DULoxetine (CYMBALTA) 60 MG capsule take one 60mg  capsule  . hydrocortisone (CORTEF) 5 MG tablet Take 5-10 mg by mouth See admin instructions. 10 mg in the morning then 5 mg midday  . insulin degludec (TRESIBA FLEXTOUCH) 100 UNIT/ML SOPN FlexTouch Pen Inject 28 Units into the skin at bedtime.   . Iron-FA-B Cmp-C-Biot-Probiotic (FUSION PLUS) CAPS Take 1 capsule by mouth daily.  . isosorbide mononitrate (IMDUR) 60 MG 24 hr tablet Take 1 tablet (60 mg total) by mouth daily.  Marland Kitchen ketoconazole (NIZORAL) 2 % cream Apply 1 fingertip amount to toenails daily  . levothyroxine (SYNTHROID) 125 MCG tablet Take 125 mcg by mouth daily.   Marland Kitchen  Multiple Vitamins-Minerals (EMERGEN-C IMMUNE PO) Take 1 capsule by mouth daily. CHEW  . nitroGLYCERIN (NITROSTAT) 0.4 MG SL tablet Place 0.4 mg under the tongue every 5 (five) minutes as needed for chest pain.   Marland Kitchen omeprazole (PRILOSEC) 40 MG capsule Take 40 mg by mouth daily.  . potassium chloride SA (K-DUR,KLOR-CON) 20 MEQ tablet Take 1 tablet (20 mEq total) by mouth daily.  . pramipexole (MIRAPEX) 1 MG tablet Take 1 mg by mouth 2 (two) times daily.  . promethazine-dextromethorphan (PROMETHAZINE-DM) 6.25-15 MG/5ML syrup TAKE 5 ML PO EVERY 6 HOURS PRN FOR COUGH  . rizatriptan (MAXALT-MLT) 10 MG disintegrating tablet   . sacubitril-valsartan (ENTRESTO) 24-26 MG Take 1 tablet by mouth 2  (two) times daily.  Marland Kitchen topiramate (TOPAMAX) 100 MG tablet Take 300 mg by mouth at bedtime.  . traZODone (DESYREL) 50 MG tablet Take 50 mg by mouth at bedtime.   . TRULICITY 1.5 JS/9.7WY SOPN Inject 1.5 mg into the skin every 7 (seven) days.  Marland Kitchen umeclidinium-vilanterol (ANORO ELLIPTA) 62.5-25 MCG/INH AEPB Inhale 1 puff into the lungs daily.     Allergies:   Hydroxychloroquine; Donepezil; Prednisone; Bupropion; Metrizamide; Varenicline; and Tape   Social History   Socioeconomic History  . Marital status: Married    Spouse name: Not on file  . Number of children: Not on file  . Years of education: Not on file  . Highest education level: Not on file  Occupational History  . Not on file  Social Needs  . Financial resource strain: Not on file  . Food insecurity:    Worry: Not on file    Inability: Not on file  . Transportation needs:    Medical: Not on file    Non-medical: Not on file  Tobacco Use  . Smoking status: Current Every Day Smoker    Types: Cigarettes  . Smokeless tobacco: Never Used  Substance and Sexual Activity  . Alcohol use: Yes  . Drug use: No  . Sexual activity: Not on file  Lifestyle  . Physical activity:    Days per week: Not on file    Minutes per session: Not on file  . Stress: Not on file  Relationships  . Social connections:    Talks on phone: Not on file    Gets together: Not on file    Attends religious service: Not on file    Active member of club or organization: Not on file    Attends meetings of clubs or organizations: Not on file    Relationship status: Not on file  Other Topics Concern  . Not on file  Social History Narrative  . Not on file     Family History: The patient's family history includes Diabetes in her father, sister, sister, and son; Stroke in her mother. ROS:   Please see the history of present illness.    All 14 point review of systems negative except as described per history of present illness  EKGs/Labs/Other Studies  Reviewed:      Recent Labs: 01/25/2017: ALT 15 03/02/2017: NT-Pro BNP 615 03/26/2017: B Natriuretic Peptide 63.7; BUN 28; Creatinine, Ser 1.24; Hemoglobin 12.3; Platelets 216; Potassium 3.8; Sodium 136  Recent Lipid Panel    Component Value Date/Time   CHOL 122 10/13/2016 0037   TRIG 68 10/13/2016 0037   HDL 34 (L) 10/13/2016 0037   CHOLHDL 3.6 10/13/2016 0037   VLDL 14 10/13/2016 0037   LDLCALC 74 10/13/2016 0037    Physical Exam:    VS:  BP 128/82 (BP Location: Right Arm, Patient Position: Sitting, Cuff Size: Normal)   Pulse 98   Ht 5\' 2"  (1.575 m)   Wt 210 lb (95.3 kg)   SpO2 98%   BMI 38.41 kg/m     Wt Readings from Last 3 Encounters:  07/07/17 210 lb (95.3 kg)  04/04/17 218 lb 0.6 oz (98.9 kg)  03/26/17 220 lb (99.8 kg)     GEN:  Well nourished, well developed in no acute distress HEENT: Normal NECK: No JVD; No carotid bruits LYMPHATICS: No lymphadenopathy CARDIAC: RRR, no murmurs, no rubs, no gallops RESPIRATORY:  Clear to auscultation without rales, wheezing or rhonchi  ABDOMEN: Soft, non-tender, non-distended MUSCULOSKELETAL:  No edema; No deformity  SKIN: Warm and dry LOWER EXTREMITIES: no swelling NEUROLOGIC:  Alert and oriented x 3 PSYCHIATRIC:  Normal affect   ASSESSMENT:    1. Atherosclerosis of native coronary artery of native heart without angina pectoris   2. Chronic combined systolic and diastolic CHF (congestive heart failure) ([place])   3. Coronary artery disease involving native coronary artery of native heart without angina pectoris   4. Essential hypertension    PLAN:    In order of problems listed above:  1. Atherosclerosis of coronary artery disease: Doing well from that point of view continue present management. 2. Congestive heart failure.  I do not see any gross evidence of fluid overload.  However will get proBNP and decide about therapy based on that 3. Essential hypertension: Blood pressure well controlled continue present  management.   Medication Adjustments/Labs and Tests Ordered: Current medicines are reviewed at length with the patient today.  Concerns regarding medicines are outlined above.  No orders of the defined types were placed in this encounter.  Medication changes: No orders of the defined types were placed in this encounter.   Signed, Park Liter, MD, [facility] 07/07/2017 11:10 AM    [place]

## 2017-07-07 NOTE — Patient Instructions (Signed)
Medication Instructions:  Your physician recommends that you continue on your current medications as directed. Please refer to the Current Medication list given to you today.   Labwork: Your physician recommends that you return for lab work today: Pro-BNP, BMP.  Testing/Procedures: None  Follow-Up: Your physician recommends that you schedule a follow-up appointment in: 1 month.  Any Other Special Instructions Will Be Listed Below (If Applicable).     If you need a refill on your cardiac medications before your next appointment, please call your pharmacy.

## 2017-07-08 LAB — BASIC METABOLIC PANEL
BUN/Creatinine Ratio: 21 (ref 12–28)
BUN: 21 mg/dL (ref 8–27)
CALCIUM: 9.7 mg/dL (ref 8.7–10.3)
CHLORIDE: 100 mmol/L (ref 96–106)
CO2: 24 mmol/L (ref 20–29)
Creatinine, Ser: 0.99 mg/dL (ref 0.57–1.00)
GFR calc Af Amer: 71 mL/min/{1.73_m2} (ref >59)
GFR, EST NON AFRICAN AMERICAN: 61 mL/min/{1.73_m2} (ref >59)
GLUCOSE: 188 mg/dL — AB (ref 65–99)
POTASSIUM: 4.3 mmol/L (ref 3.5–5.2)
Sodium: 142 mmol/L (ref 134–144)

## 2017-07-08 LAB — PRO B NATRIURETIC PEPTIDE: NT-Pro BNP: 789 pg/mL — ABNORMAL HIGH (ref 0–287)

## 2017-07-10 ENCOUNTER — Encounter: Payer: Self-pay | Admitting: Neurology

## 2017-07-11 DIAGNOSIS — R0602 Shortness of breath: Secondary | ICD-10-CM | POA: Diagnosis not present

## 2017-07-11 DIAGNOSIS — G43909 Migraine, unspecified, not intractable, without status migrainosus: Secondary | ICD-10-CM | POA: Diagnosis not present

## 2017-07-11 DIAGNOSIS — R51 Headache: Secondary | ICD-10-CM | POA: Diagnosis not present

## 2017-07-11 DIAGNOSIS — R42 Dizziness and giddiness: Secondary | ICD-10-CM | POA: Diagnosis not present

## 2017-07-11 DIAGNOSIS — I110 Hypertensive heart disease with heart failure: Secondary | ICD-10-CM | POA: Diagnosis not present

## 2017-07-18 DIAGNOSIS — Z6841 Body Mass Index (BMI) 40.0 and over, adult: Secondary | ICD-10-CM | POA: Diagnosis not present

## 2017-07-18 DIAGNOSIS — I10 Essential (primary) hypertension: Secondary | ICD-10-CM | POA: Diagnosis not present

## 2017-07-18 DIAGNOSIS — R42 Dizziness and giddiness: Secondary | ICD-10-CM | POA: Diagnosis not present

## 2017-07-18 DIAGNOSIS — E1159 Type 2 diabetes mellitus with other circulatory complications: Secondary | ICD-10-CM | POA: Diagnosis not present

## 2017-07-20 DIAGNOSIS — Z6839 Body mass index (BMI) 39.0-39.9, adult: Secondary | ICD-10-CM | POA: Diagnosis not present

## 2017-07-20 DIAGNOSIS — H05011 Cellulitis of right orbit: Secondary | ICD-10-CM | POA: Diagnosis not present

## 2017-07-25 ENCOUNTER — Encounter: Payer: Self-pay | Admitting: Neurology

## 2017-07-25 DIAGNOSIS — R0602 Shortness of breath: Secondary | ICD-10-CM | POA: Diagnosis not present

## 2017-07-25 DIAGNOSIS — J449 Chronic obstructive pulmonary disease, unspecified: Secondary | ICD-10-CM | POA: Diagnosis not present

## 2017-07-25 DIAGNOSIS — E1151 Type 2 diabetes mellitus with diabetic peripheral angiopathy without gangrene: Secondary | ICD-10-CM | POA: Diagnosis not present

## 2017-07-25 DIAGNOSIS — I10 Essential (primary) hypertension: Secondary | ICD-10-CM | POA: Diagnosis not present

## 2017-07-25 DIAGNOSIS — R9430 Abnormal result of cardiovascular function study, unspecified: Secondary | ICD-10-CM | POA: Diagnosis not present

## 2017-07-25 DIAGNOSIS — Z6839 Body mass index (BMI) 39.0-39.9, adult: Secondary | ICD-10-CM | POA: Diagnosis not present

## 2017-08-01 DIAGNOSIS — Z6839 Body mass index (BMI) 39.0-39.9, adult: Secondary | ICD-10-CM | POA: Diagnosis not present

## 2017-08-01 DIAGNOSIS — Z139 Encounter for screening, unspecified: Secondary | ICD-10-CM | POA: Diagnosis not present

## 2017-08-01 DIAGNOSIS — J449 Chronic obstructive pulmonary disease, unspecified: Secondary | ICD-10-CM | POA: Diagnosis not present

## 2017-08-08 DIAGNOSIS — X58XXXS Exposure to other specified factors, sequela: Secondary | ICD-10-CM | POA: Diagnosis not present

## 2017-08-08 DIAGNOSIS — Z8719 Personal history of other diseases of the digestive system: Secondary | ICD-10-CM | POA: Diagnosis not present

## 2017-08-08 DIAGNOSIS — S301XXS Contusion of abdominal wall, sequela: Secondary | ICD-10-CM | POA: Diagnosis not present

## 2017-08-09 DIAGNOSIS — G4733 Obstructive sleep apnea (adult) (pediatric): Secondary | ICD-10-CM | POA: Diagnosis not present

## 2017-08-09 DIAGNOSIS — J449 Chronic obstructive pulmonary disease, unspecified: Secondary | ICD-10-CM | POA: Diagnosis not present

## 2017-08-09 DIAGNOSIS — J310 Chronic rhinitis: Secondary | ICD-10-CM | POA: Diagnosis not present

## 2017-08-09 DIAGNOSIS — F17210 Nicotine dependence, cigarettes, uncomplicated: Secondary | ICD-10-CM | POA: Diagnosis not present

## 2017-08-10 DIAGNOSIS — Z6841 Body Mass Index (BMI) 40.0 and over, adult: Secondary | ICD-10-CM | POA: Diagnosis not present

## 2017-08-10 DIAGNOSIS — M1712 Unilateral primary osteoarthritis, left knee: Secondary | ICD-10-CM | POA: Diagnosis not present

## 2017-08-10 DIAGNOSIS — Z139 Encounter for screening, unspecified: Secondary | ICD-10-CM | POA: Diagnosis not present

## 2017-08-10 DIAGNOSIS — Z01818 Encounter for other preprocedural examination: Secondary | ICD-10-CM | POA: Diagnosis not present

## 2017-08-14 ENCOUNTER — Ambulatory Visit: Payer: Federal, State, Local not specified - PPO | Admitting: Cardiology

## 2017-08-15 DIAGNOSIS — R1011 Right upper quadrant pain: Secondary | ICD-10-CM | POA: Diagnosis not present

## 2017-08-15 DIAGNOSIS — L7682 Other postprocedural complications of skin and subcutaneous tissue: Secondary | ICD-10-CM | POA: Diagnosis not present

## 2017-08-17 DIAGNOSIS — E1151 Type 2 diabetes mellitus with diabetic peripheral angiopathy without gangrene: Secondary | ICD-10-CM | POA: Diagnosis not present

## 2017-08-17 DIAGNOSIS — Z6839 Body mass index (BMI) 39.0-39.9, adult: Secondary | ICD-10-CM | POA: Diagnosis not present

## 2017-08-17 DIAGNOSIS — M1712 Unilateral primary osteoarthritis, left knee: Secondary | ICD-10-CM | POA: Diagnosis not present

## 2017-08-24 DIAGNOSIS — M1712 Unilateral primary osteoarthritis, left knee: Secondary | ICD-10-CM | POA: Diagnosis not present

## 2017-08-24 DIAGNOSIS — J449 Chronic obstructive pulmonary disease, unspecified: Secondary | ICD-10-CM | POA: Diagnosis not present

## 2017-09-08 DIAGNOSIS — J441 Chronic obstructive pulmonary disease with (acute) exacerbation: Secondary | ICD-10-CM | POA: Diagnosis not present

## 2017-09-08 DIAGNOSIS — F17210 Nicotine dependence, cigarettes, uncomplicated: Secondary | ICD-10-CM | POA: Diagnosis not present

## 2017-09-08 DIAGNOSIS — G4733 Obstructive sleep apnea (adult) (pediatric): Secondary | ICD-10-CM | POA: Diagnosis not present

## 2017-09-08 DIAGNOSIS — J310 Chronic rhinitis: Secondary | ICD-10-CM | POA: Diagnosis not present

## 2017-09-08 DIAGNOSIS — Z1231 Encounter for screening mammogram for malignant neoplasm of breast: Secondary | ICD-10-CM | POA: Diagnosis not present

## 2017-09-10 DIAGNOSIS — M79605 Pain in left leg: Secondary | ICD-10-CM | POA: Diagnosis not present

## 2017-09-10 DIAGNOSIS — R0600 Dyspnea, unspecified: Secondary | ICD-10-CM | POA: Diagnosis not present

## 2017-09-10 DIAGNOSIS — R0602 Shortness of breath: Secondary | ICD-10-CM | POA: Diagnosis not present

## 2017-09-10 DIAGNOSIS — R600 Localized edema: Secondary | ICD-10-CM | POA: Diagnosis not present

## 2017-09-10 DIAGNOSIS — M79604 Pain in right leg: Secondary | ICD-10-CM | POA: Diagnosis not present

## 2017-09-11 DIAGNOSIS — I509 Heart failure, unspecified: Secondary | ICD-10-CM | POA: Diagnosis not present

## 2017-09-11 DIAGNOSIS — J449 Chronic obstructive pulmonary disease, unspecified: Secondary | ICD-10-CM | POA: Diagnosis not present

## 2017-09-11 DIAGNOSIS — G4733 Obstructive sleep apnea (adult) (pediatric): Secondary | ICD-10-CM | POA: Diagnosis not present

## 2017-09-12 DIAGNOSIS — I110 Hypertensive heart disease with heart failure: Secondary | ICD-10-CM | POA: Diagnosis not present

## 2017-09-12 DIAGNOSIS — E785 Hyperlipidemia, unspecified: Secondary | ICD-10-CM | POA: Diagnosis not present

## 2017-09-12 DIAGNOSIS — E1165 Type 2 diabetes mellitus with hyperglycemia: Secondary | ICD-10-CM | POA: Diagnosis not present

## 2017-09-13 ENCOUNTER — Encounter: Payer: Self-pay | Admitting: Cardiology

## 2017-09-13 ENCOUNTER — Ambulatory Visit (INDEPENDENT_AMBULATORY_CARE_PROVIDER_SITE_OTHER): Payer: Federal, State, Local not specified - PPO | Admitting: Cardiology

## 2017-09-13 VITALS — BP 120/70 | HR 86 | Ht 62.0 in | Wt 216.1 lb

## 2017-09-13 DIAGNOSIS — I2510 Atherosclerotic heart disease of native coronary artery without angina pectoris: Secondary | ICD-10-CM

## 2017-09-13 DIAGNOSIS — R0609 Other forms of dyspnea: Secondary | ICD-10-CM | POA: Diagnosis not present

## 2017-09-13 DIAGNOSIS — F17210 Nicotine dependence, cigarettes, uncomplicated: Secondary | ICD-10-CM | POA: Diagnosis not present

## 2017-09-13 DIAGNOSIS — I509 Heart failure, unspecified: Secondary | ICD-10-CM | POA: Diagnosis not present

## 2017-09-13 DIAGNOSIS — I255 Ischemic cardiomyopathy: Secondary | ICD-10-CM | POA: Diagnosis not present

## 2017-09-13 DIAGNOSIS — I5042 Chronic combined systolic (congestive) and diastolic (congestive) heart failure: Secondary | ICD-10-CM

## 2017-09-13 DIAGNOSIS — J449 Chronic obstructive pulmonary disease, unspecified: Secondary | ICD-10-CM | POA: Diagnosis not present

## 2017-09-13 DIAGNOSIS — R0600 Dyspnea, unspecified: Secondary | ICD-10-CM

## 2017-09-13 DIAGNOSIS — G4733 Obstructive sleep apnea (adult) (pediatric): Secondary | ICD-10-CM | POA: Diagnosis not present

## 2017-09-13 DIAGNOSIS — E119 Type 2 diabetes mellitus without complications: Secondary | ICD-10-CM | POA: Diagnosis not present

## 2017-09-13 DIAGNOSIS — Z794 Long term (current) use of insulin: Secondary | ICD-10-CM

## 2017-09-13 MED ORDER — FUROSEMIDE 40 MG PO TABS: ORAL_TABLET | ORAL | 3 refills | Status: DC

## 2017-09-13 NOTE — Patient Instructions (Signed)
Medication Instructions:  Your physician has recommended you make the following change in your medication:  INCREASE furosemide (lasix) 80 mg in the morning and 40 mg (1 tablet) in the evening  Labwork: Your physician recommends that you have the following labs drawn: BMP.   Testing/Procedures: None  Follow-Up: Your physician recommends that you schedule a follow-up appointment in: 3 weeks.   If you need a refill on your cardiac medications before your next appointment, please call your pharmacy.   Thank you for choosing [facility]! Robyne Peers, RN [phone]

## 2017-09-13 NOTE — Progress Notes (Signed)
Cardiology Office Note:    Date:  09/13/2017   ID:  Crystal Yates, DOB 16-May-1955, MRN [id number]  PCP:  Dortha Kern, PA  Cardiologist:  Jenne Campus, MD    Referring MD: Dortha Kern, [place]   Chief Complaint  Patient presents with  . Shortness of Breath  Short of breath  History of Present Illness:    Crystal Yates is a 62 y.o. female who comes to our office for follow-up she is not feeling well recently she was diagnosed with some pneumonia she was given antibiotic started feeling better but still chief complaint is shortness of breath on top of that she does have significant swelling of lower extremities.  No chest pain tightness squeezing pressure burning chest.  Some cough but no sputum production.  She got chronic insomnia as she had difficulty sleeping at night she has to get up many times during the night and urinate.  Past Medical History:  Diagnosis Date  . CAD in native artery    a. Prior LAD stenting based on cath. b. RCA stenting 03/2016 x2.  . Chronic combined systolic and diastolic CHF (congestive heart failure) ([place])   . CKD (chronic kidney disease), stage II   . COPD (chronic obstructive pulmonary disease) ([place])   . Diabetes mellitus without complication ([place])   . Hashimoto's thyroiditis   . Hyperlipidemia   . Hypertension   . Secondary adrenal insufficiency ([place])   . Thyroid disease   . Tobacco abuse     Past Surgical History:  Procedure Laterality Date  . ABDOMINAL SURGERY    . CESAREAN SECTION    . CHOLECYSTECTOMY    . COLON RESECTION    . HERNIA MESH REMOVAL    . HERNIA REPAIR    . LEFT HEART CATH AND CORONARY ANGIOGRAPHY N/A 10/13/2016   Procedure: Left Heart Cath and Coronary Angiography;  Surgeon: Nelva Bush, MD;  Location: [facility];  Service: Cardiovascular;  Laterality: N/A;  . SHOULDER ARTHROSCOPY    . TUBAL LIGATION      Current Medications: Current Meds  Medication Sig  . aspirin EC 81 MG tablet Take by mouth.    Marland Kitchen atorvastatin (LIPITOR) 20 MG tablet Take 20 mg by mouth daily.  . carvedilol (COREG) 3.125 MG tablet Take 3.125 mg by mouth 2 (two) times daily with a meal.  . Cholecalciferol (VITAMIN D3) 2000 units capsule Take 2,000 Units by mouth daily.   . clopidogrel (PLAVIX) 75 MG tablet Take 75 mg by mouth daily.  . DULoxetine (CYMBALTA) 60 MG capsule take one 60mg  capsule  . EMGALITY 120 MG/ML SOAJ Inject 120 mg into the skin every 30 (thirty) days.  . furosemide (LASIX) 40 MG tablet Take 1 tablet (40 mg total) by mouth 2 (two) times daily. (Patient taking differently: Take 80 mg by mouth 2 (two) times daily. )  . hydrocortisone (CORTEF) 5 MG tablet Take 5-10 mg by mouth See admin instructions. 10 mg in the morning then 5 mg midday  . insulin degludec (TRESIBA FLEXTOUCH) 100 UNIT/ML SOPN FlexTouch Pen Inject 33 Units into the skin at bedtime.   . Iron-FA-B Cmp-C-Biot-Probiotic (FUSION PLUS) CAPS Take 1 capsule by mouth daily.  . isosorbide mononitrate (IMDUR) 60 MG 24 hr tablet Take 1 tablet (60 mg total) by mouth daily.  Marland Kitchen ketoconazole (NIZORAL) 2 % cream Apply 1 fingertip amount to toenails daily  . levothyroxine (SYNTHROID) 125 MCG tablet Take 125 mcg by mouth daily.   . Multiple Vitamins-Minerals (EMERGEN-C IMMUNE PO)  Take 1 capsule by mouth daily. CHEW  . nitroGLYCERIN (NITROSTAT) 0.4 MG SL tablet Place 0.4 mg under the tongue every 5 (five) minutes as needed for chest pain.   Marland Kitchen omeprazole (PRILOSEC) 40 MG capsule Take 40 mg by mouth daily.  . potassium chloride SA (K-DUR,KLOR-CON) 20 MEQ tablet Take 1 tablet (20 mEq total) by mouth daily.  . pramipexole (MIRAPEX) 1 MG tablet Take 1 mg by mouth 2 (two) times daily.  . sacubitril-valsartan (ENTRESTO) 24-26 MG Take 1 tablet by mouth 2 (two) times daily.  . TRELEGY ELLIPTA 100-62.5-25 MCG/INH AEPB Inhale 1 puff into the lungs daily.  . TRULICITY 1.5 XN/2.3FT SOPN Inject 1.5 mg into the skin every 7 (seven) days.     Allergies:    Hydroxychloroquine; Donepezil; Prednisone; Bupropion; Metrizamide; Varenicline; and Tape   Social History   Socioeconomic History  . Marital status: Married    Spouse name: Not on file  . Number of children: Not on file  . Years of education: Not on file  . Highest education level: Not on file  Occupational History  . Not on file  Social Needs  . Financial resource strain: Not on file  . Food insecurity:    Worry: Not on file    Inability: Not on file  . Transportation needs:    Medical: Not on file    Non-medical: Not on file  Tobacco Use  . Smoking status: Current Every Day Smoker    Types: Cigarettes  . Smokeless tobacco: Never Used  Substance and Sexual Activity  . Alcohol use: Yes  . Drug use: No  . Sexual activity: Not on file  Lifestyle  . Physical activity:    Days per week: Not on file    Minutes per session: Not on file  . Stress: Not on file  Relationships  . Social connections:    Talks on phone: Not on file    Gets together: Not on file    Attends religious service: Not on file    Active member of club or organization: Not on file    Attends meetings of clubs or organizations: Not on file    Relationship status: Not on file  Other Topics Concern  . Not on file  Social History Narrative  . Not on file     Family History: The patient's family history includes Diabetes in her father, sister, sister, and son; Stroke in her mother. ROS:   Please see the history of present illness.    All 14 point review of systems negative except as described per history of present illness  EKGs/Labs/Other Studies Reviewed:      Recent Labs: 01/25/2017: ALT 15 03/26/2017: B Natriuretic Peptide 63.7; Hemoglobin 12.3; Platelets 216 07/07/2017: BUN 21; Creatinine, Ser 0.99; NT-Pro BNP 789; Potassium 4.3; Sodium 142  Recent Lipid Panel    Component Value Date/Time   CHOL 122 10/13/2016 0037   TRIG 68 10/13/2016 0037   HDL 34 (L) 10/13/2016 0037   CHOLHDL 3.6  10/13/2016 0037   VLDL 14 10/13/2016 0037   LDLCALC 74 10/13/2016 0037    Physical Exam:    VS:  BP 120/70   Pulse 86   Ht 5\' 2"  (1.575 m)   Wt 216 lb 1.9 oz (98 kg)   SpO2 93%   BMI 39.53 kg/m     Wt Readings from Last 3 Encounters:  09/13/17 216 lb 1.9 oz (98 kg)  07/07/17 210 lb (95.3 kg)  04/04/17 218 lb 0.6  oz (98.9 kg)     GEN:  Well nourished, well developed in no acute distress HEENT: Normal NECK: No JVD; No carotid bruits LYMPHATICS: No lymphadenopathy CARDIAC: RRR, no murmurs, no rubs, no gallops RESPIRATORY:  Clear to auscultation without rales, wheezing or rhonchi  ABDOMEN: Soft, non-tender, non-distended MUSCULOSKELETAL:  No edema; No deformity  SKIN: Warm and dry LOWER EXTREMITIES: 1-2+ swelling. NEUROLOGIC:  Alert and oriented x 3 PSYCHIATRIC:  Normal affect   ASSESSMENT:    1. Chronic combined systolic and diastolic CHF (congestive heart failure) ([place])   2. Atherosclerosis of native coronary artery of native heart without angina pectoris   3. Ischemic cardiomyopathy   4. Type 2 diabetes mellitus without complication, with long-term current use of insulin ([place])   5. Dyspnea on exertion    PLAN:    In order of problems listed above:  1. Chronic combined systolic and diastolic congestive heart failure.  I will increase dose of furosemide to 80 in the morning 40 in the afternoon will check Chem-7 today.  Based on that we decide about aggressiveness of the therapy.  Rest of the medication will be the same 2. Coronary artery disease: Stable. 3. Ischemic cardia myopathy management as above.  We will continue present medications. 4. Dyspnea on exertion undoubtedly multifactorial congestive heart failure appears to be playing some role here we will try to manage this the best we can.   Medication Adjustments/Labs and Tests Ordered: Current medicines are reviewed at length with the patient today.  Concerns regarding medicines are outlined above.  No orders  of the defined types were placed in this encounter.  Medication changes: No orders of the defined types were placed in this encounter.   Signed, Park Liter, MD, [facility] 09/13/2017 3:38 PM    [place]

## 2017-09-14 DIAGNOSIS — Z6841 Body Mass Index (BMI) 40.0 and over, adult: Secondary | ICD-10-CM | POA: Diagnosis not present

## 2017-09-14 DIAGNOSIS — I110 Hypertensive heart disease with heart failure: Secondary | ICD-10-CM | POA: Diagnosis not present

## 2017-09-14 DIAGNOSIS — E1169 Type 2 diabetes mellitus with other specified complication: Secondary | ICD-10-CM | POA: Diagnosis not present

## 2017-09-14 DIAGNOSIS — E1151 Type 2 diabetes mellitus with diabetic peripheral angiopathy without gangrene: Secondary | ICD-10-CM | POA: Diagnosis not present

## 2017-09-14 DIAGNOSIS — E785 Hyperlipidemia, unspecified: Secondary | ICD-10-CM | POA: Diagnosis not present

## 2017-09-14 DIAGNOSIS — Z139 Encounter for screening, unspecified: Secondary | ICD-10-CM | POA: Diagnosis not present

## 2017-09-14 LAB — BASIC METABOLIC PANEL
BUN/Creatinine Ratio: 36 — ABNORMAL HIGH (ref 12–28)
BUN: 41 mg/dL — AB (ref 8–27)
CALCIUM: 9.4 mg/dL (ref 8.7–10.3)
CO2: 25 mmol/L (ref 20–29)
Chloride: 97 mmol/L (ref 96–106)
Creatinine, Ser: 1.15 mg/dL — ABNORMAL HIGH (ref 0.57–1.00)
GFR calc Af Amer: 59 mL/min/{1.73_m2} — ABNORMAL LOW (ref >59)
GFR calc non Af Amer: 51 mL/min/{1.73_m2} — ABNORMAL LOW (ref >59)
GLUCOSE: 323 mg/dL — AB (ref 65–99)
Potassium: 4.7 mmol/L (ref 3.5–5.2)
Sodium: 137 mmol/L (ref 134–144)

## 2017-09-15 ENCOUNTER — Telehealth: Payer: Self-pay | Admitting: *Deleted

## 2017-09-15 DIAGNOSIS — I5022 Chronic systolic (congestive) heart failure: Secondary | ICD-10-CM

## 2017-09-15 MED ORDER — FUROSEMIDE 80 MG PO TABS: ORAL_TABLET | ORAL | Status: DC

## 2017-09-15 NOTE — Telephone Encounter (Signed)
Patient informed of results and advised to increase furosemide to 80 mg in the morning and 80 mg in the evening. Advised patient to go to [facility] in [place] on Monday for follow up lab work. Patient verbalized understanding. Patient states her shortness of breath is not improving. Advised patient to increase furosemide and see if that helps. If not and shortness of breath worsens, advised her to go to the hospital to be evaluation. Patient verbalized understanding. No further questions. Encouraged her to call our office with any questions or concerns.

## 2017-09-15 NOTE — Telephone Encounter (Signed)
-----   Message from Park Liter, MD sent at 09/14/2017  4:36 PM EDT ----- Increase furosemide to 80 mg in the morning and 80 mg in the afternoon Chem-7 to be checked on Monday

## 2017-09-16 ENCOUNTER — Other Ambulatory Visit: Payer: Self-pay

## 2017-09-16 ENCOUNTER — Encounter (HOSPITAL_COMMUNITY): Payer: Self-pay

## 2017-09-16 ENCOUNTER — Inpatient Hospital Stay (HOSPITAL_COMMUNITY)
Admission: EM | Admit: 2017-09-16 | Discharge: 2017-09-18 | DRG: 291 | Disposition: A | Discharge status: Home / Self Care | Payer: Federal, State, Local not specified - PPO | Attending: Internal Medicine | Admitting: Internal Medicine

## 2017-09-16 ENCOUNTER — Emergency Department (HOSPITAL_COMMUNITY): Payer: Federal, State, Local not specified - PPO

## 2017-09-16 DIAGNOSIS — I5043 Acute on chronic combined systolic (congestive) and diastolic (congestive) heart failure: Secondary | ICD-10-CM | POA: Diagnosis not present

## 2017-09-16 DIAGNOSIS — Z7982 Long term (current) use of aspirin: Secondary | ICD-10-CM | POA: Diagnosis not present

## 2017-09-16 DIAGNOSIS — F17210 Nicotine dependence, cigarettes, uncomplicated: Secondary | ICD-10-CM | POA: Diagnosis not present

## 2017-09-16 DIAGNOSIS — Z9981 Dependence on supplemental oxygen: Secondary | ICD-10-CM | POA: Diagnosis not present

## 2017-09-16 DIAGNOSIS — R0603 Acute respiratory distress: Secondary | ICD-10-CM | POA: Insufficient documentation

## 2017-09-16 DIAGNOSIS — E079 Disorder of thyroid, unspecified: Secondary | ICD-10-CM | POA: Diagnosis not present

## 2017-09-16 DIAGNOSIS — I110 Hypertensive heart disease with heart failure: Secondary | ICD-10-CM | POA: Diagnosis not present

## 2017-09-16 DIAGNOSIS — E785 Hyperlipidemia, unspecified: Secondary | ICD-10-CM | POA: Diagnosis not present

## 2017-09-16 DIAGNOSIS — Z7902 Long term (current) use of antithrombotics/antiplatelets: Secondary | ICD-10-CM

## 2017-09-16 DIAGNOSIS — I959 Hypotension, unspecified: Secondary | ICD-10-CM | POA: Diagnosis not present

## 2017-09-16 DIAGNOSIS — Z8719 Personal history of other diseases of the digestive system: Secondary | ICD-10-CM

## 2017-09-16 DIAGNOSIS — J449 Chronic obstructive pulmonary disease, unspecified: Secondary | ICD-10-CM | POA: Diagnosis not present

## 2017-09-16 DIAGNOSIS — I2510 Atherosclerotic heart disease of native coronary artery without angina pectoris: Secondary | ICD-10-CM | POA: Diagnosis not present

## 2017-09-16 DIAGNOSIS — R0602 Shortness of breath: Secondary | ICD-10-CM | POA: Diagnosis not present

## 2017-09-16 DIAGNOSIS — E1122 Type 2 diabetes mellitus with diabetic chronic kidney disease: Secondary | ICD-10-CM | POA: Diagnosis not present

## 2017-09-16 DIAGNOSIS — Z79899 Other long term (current) drug therapy: Secondary | ICD-10-CM

## 2017-09-16 DIAGNOSIS — E2740 Unspecified adrenocortical insufficiency: Secondary | ICD-10-CM | POA: Diagnosis present

## 2017-09-16 DIAGNOSIS — J441 Chronic obstructive pulmonary disease with (acute) exacerbation: Secondary | ICD-10-CM | POA: Diagnosis not present

## 2017-09-16 DIAGNOSIS — Z833 Family history of diabetes mellitus: Secondary | ICD-10-CM

## 2017-09-16 DIAGNOSIS — R32 Unspecified urinary incontinence: Secondary | ICD-10-CM

## 2017-09-16 DIAGNOSIS — E039 Hypothyroidism, unspecified: Secondary | ICD-10-CM | POA: Diagnosis not present

## 2017-09-16 DIAGNOSIS — I5023 Acute on chronic systolic (congestive) heart failure: Secondary | ICD-10-CM | POA: Diagnosis not present

## 2017-09-16 DIAGNOSIS — Z794 Long term (current) use of insulin: Secondary | ICD-10-CM

## 2017-09-16 DIAGNOSIS — N182 Chronic kidney disease, stage 2 (mild): Secondary | ICD-10-CM | POA: Diagnosis present

## 2017-09-16 DIAGNOSIS — E1165 Type 2 diabetes mellitus with hyperglycemia: Secondary | ICD-10-CM | POA: Diagnosis not present

## 2017-09-16 DIAGNOSIS — Z888 Allergy status to other drugs, medicaments and biological substances status: Secondary | ICD-10-CM | POA: Diagnosis not present

## 2017-09-16 DIAGNOSIS — Z955 Presence of coronary angioplasty implant and graft: Secondary | ICD-10-CM | POA: Diagnosis not present

## 2017-09-16 DIAGNOSIS — I130 Hypertensive heart and chronic kidney disease with heart failure and stage 1 through stage 4 chronic kidney disease, or unspecified chronic kidney disease: Principal | ICD-10-CM

## 2017-09-16 DIAGNOSIS — Z91048 Other nonmedicinal substance allergy status: Secondary | ICD-10-CM

## 2017-09-16 DIAGNOSIS — E2749 Other adrenocortical insufficiency: Secondary | ICD-10-CM | POA: Diagnosis not present

## 2017-09-16 DIAGNOSIS — I509 Heart failure, unspecified: Secondary | ICD-10-CM | POA: Diagnosis not present

## 2017-09-16 DIAGNOSIS — Z79890 Hormone replacement therapy (postmenopausal): Secondary | ICD-10-CM | POA: Diagnosis not present

## 2017-09-16 LAB — BASIC METABOLIC PANEL
Anion gap: 6 (ref 5–15)
BUN: 27 mg/dL — ABNORMAL HIGH (ref 6–20)
CALCIUM: 9.1 mg/dL (ref 8.9–10.3)
CO2: 33 mmol/L — AB (ref 22–32)
Chloride: 98 mmol/L — ABNORMAL LOW (ref 101–111)
Creatinine, Ser: 1.18 mg/dL — ABNORMAL HIGH (ref 0.44–1.00)
GFR calc non Af Amer: 48 mL/min — ABNORMAL LOW (ref >60)
GFR, EST AFRICAN AMERICAN: 56 mL/min — AB (ref >60)
Glucose, Bld: 225 mg/dL — ABNORMAL HIGH (ref 65–99)
Potassium: 4.6 mmol/L (ref 3.5–5.1)
Sodium: 137 mmol/L (ref 135–145)

## 2017-09-16 LAB — CREATININE, SERUM
Creatinine, Ser: 1.09 mg/dL — ABNORMAL HIGH (ref 0.44–1.00)
GFR calc non Af Amer: 53 mL/min — ABNORMAL LOW (ref >60)

## 2017-09-16 LAB — CBC
HCT: 44.9 % (ref 36.0–46.0)
HCT: 43.1 % (ref 36.0–46.0)
HEMOGLOBIN: 14.3 g/dL (ref 12.0–15.0)
Hemoglobin: 14.1 g/dL (ref 12.0–15.0)
MCH: 29.4 pg (ref 26.0–34.0)
MCH: 29.8 pg (ref 26.0–34.0)
MCHC: 31.8 g/dL (ref 30.0–36.0)
MCHC: 32.7 g/dL (ref 30.0–36.0)
MCV: 92.2 fL (ref 78.0–100.0)
MCV: 91.1 fL (ref 78.0–100.0)
Platelets: 179 10*3/uL (ref 150–400)
Platelets: 185 10*3/uL (ref 150–400)
RBC: 4.87 MIL/uL (ref 3.87–5.11)
RBC: 4.73 MIL/uL (ref 3.87–5.11)
RDW: 16.1 % — ABNORMAL HIGH (ref 11.5–15.5)
RDW: 16.1 % — AB (ref 11.5–15.5)
WBC: 14.2 10*3/uL — ABNORMAL HIGH (ref 4.0–10.5)
WBC: 10.3 10*3/uL (ref 4.0–10.5)

## 2017-09-16 LAB — TSH: TSH: 2.218 u[IU]/mL (ref 0.350–4.500)

## 2017-09-16 LAB — BRAIN NATRIURETIC PEPTIDE: B NATRIURETIC PEPTIDE 5: 118.6 pg/mL — AB (ref 0.0–100.0)

## 2017-09-16 LAB — I-STAT TROPONIN, ED: Troponin i, poc: 0 ng/mL (ref 0.00–0.08)

## 2017-09-16 LAB — GLUCOSE, CAPILLARY: GLUCOSE-CAPILLARY: 92 mg/dL (ref 65–99)

## 2017-09-16 LAB — TROPONIN I

## 2017-09-16 MED ORDER — LEVOTHYROXINE SODIUM 125 MCG PO TABS
125.0000 ug | ORAL_TABLET | Freq: Every day | ORAL | Status: DC
  Administered 2017-09-17 – 2017-09-18 (×2): 125 ug via ORAL
  Filled 2017-09-16 (×2): qty 1

## 2017-09-16 MED ORDER — FLUTICASONE-UMECLIDIN-VILANT 100-62.5-25 MCG/INH IN AEPB: 1.0000 | INHALATION_SPRAY | Freq: Every day | RESPIRATORY_TRACT | Status: DC

## 2017-09-16 MED ORDER — ACETAMINOPHEN 650 MG RE SUPP: 650.0000 mg | Freq: Four times a day (QID) | RECTAL | Status: DC | PRN

## 2017-09-16 MED ORDER — FUSION PLUS PO CAPS: 1.0000 | ORAL_CAPSULE | Freq: Every day | ORAL | Status: DC

## 2017-09-16 MED ORDER — ASPIRIN EC 81 MG PO TBEC
81.0000 mg | DELAYED_RELEASE_TABLET | Freq: Every day | ORAL | Status: DC
  Administered 2017-09-17 – 2017-09-18 (×2): 81 mg via ORAL
  Filled 2017-09-16 (×2): qty 1

## 2017-09-16 MED ORDER — HYDROCORTISONE 5 MG PO TABS
5.0000 mg | ORAL_TABLET | ORAL | Status: DC
  Filled 2017-09-16: qty 1

## 2017-09-16 MED ORDER — ISOSORBIDE MONONITRATE ER 30 MG PO TB24: 60.0000 mg | ORAL_TABLET | Freq: Every day | ORAL | Status: DC

## 2017-09-16 MED ORDER — INSULIN ASPART 100 UNIT/ML ~~LOC~~ SOLN
0.0000 [IU] | Freq: Three times a day (TID) | SUBCUTANEOUS | Status: DC
  Administered 2017-09-17: 11 [IU] via SUBCUTANEOUS
  Administered 2017-09-17: 20 [IU] via SUBCUTANEOUS

## 2017-09-16 MED ORDER — DULOXETINE HCL 60 MG PO CPEP
60.0000 mg | ORAL_CAPSULE | Freq: Every day | ORAL | Status: DC
  Administered 2017-09-17 – 2017-09-18 (×2): 60 mg via ORAL
  Filled 2017-09-16 (×2): qty 1

## 2017-09-16 MED ORDER — SACUBITRIL-VALSARTAN 24-26 MG PO TABS
1.0000 | ORAL_TABLET | Freq: Two times a day (BID) | ORAL | Status: DC
  Administered 2017-09-16: 1 via ORAL
  Filled 2017-09-16 (×2): qty 1

## 2017-09-16 MED ORDER — IPRATROPIUM-ALBUTEROL 0.5-2.5 (3) MG/3ML IN SOLN
3.0000 mL | RESPIRATORY_TRACT | Status: DC
  Administered 2017-09-16 – 2017-09-17 (×2): 3 mL via RESPIRATORY_TRACT
  Filled 2017-09-16 (×2): qty 3

## 2017-09-16 MED ORDER — ACETAMINOPHEN 325 MG PO TABS
650.0000 mg | ORAL_TABLET | Freq: Once | ORAL | Status: AC
  Administered 2017-09-16: 650 mg via ORAL
  Filled 2017-09-16: qty 2

## 2017-09-16 MED ORDER — CLOPIDOGREL BISULFATE 75 MG PO TABS
75.0000 mg | ORAL_TABLET | Freq: Every day | ORAL | Status: DC
  Administered 2017-09-17 – 2017-09-18 (×2): 75 mg via ORAL
  Filled 2017-09-16 (×2): qty 1

## 2017-09-16 MED ORDER — HYDROCORTISONE 5 MG PO TABS
10.0000 mg | ORAL_TABLET | Freq: Every day | ORAL | Status: DC
  Filled 2017-09-16: qty 2

## 2017-09-16 MED ORDER — FLUTICASONE FUROATE-VILANTEROL 100-25 MCG/INH IN AEPB
1.0000 | INHALATION_SPRAY | Freq: Every day | RESPIRATORY_TRACT | Status: DC
  Administered 2017-09-17 – 2017-09-18 (×2): 1 via RESPIRATORY_TRACT
  Filled 2017-09-16: qty 28

## 2017-09-16 MED ORDER — PANTOPRAZOLE SODIUM 40 MG PO TBEC
40.0000 mg | DELAYED_RELEASE_TABLET | Freq: Every day | ORAL | Status: DC
  Administered 2017-09-17 – 2017-09-18 (×2): 40 mg via ORAL
  Filled 2017-09-16 (×3): qty 1

## 2017-09-16 MED ORDER — ENOXAPARIN SODIUM 40 MG/0.4ML ~~LOC~~ SOLN: 40.0000 mg | SUBCUTANEOUS | Status: DC

## 2017-09-16 MED ORDER — NICOTINE 14 MG/24HR TD PT24
14.0000 mg | MEDICATED_PATCH | Freq: Every day | TRANSDERMAL | Status: DC
  Administered 2017-09-16 – 2017-09-17 (×2): 14 mg via TRANSDERMAL
  Filled 2017-09-16 (×2): qty 1

## 2017-09-16 MED ORDER — ACETAMINOPHEN 325 MG PO TABS
650.0000 mg | ORAL_TABLET | Freq: Four times a day (QID) | ORAL | Status: DC | PRN
  Administered 2017-09-16 – 2017-09-17 (×2): 650 mg via ORAL
  Filled 2017-09-16 (×2): qty 2

## 2017-09-16 MED ORDER — FENTANYL CITRATE (PF) 100 MCG/2ML IJ SOLN
50.0000 ug | Freq: Once | INTRAMUSCULAR | Status: AC
  Administered 2017-09-16: 50 ug via INTRAVENOUS
  Filled 2017-09-16: qty 2

## 2017-09-16 MED ORDER — FUROSEMIDE 10 MG/ML IJ SOLN: 80.0000 mg | Freq: Every day | INTRAMUSCULAR | Status: DC

## 2017-09-16 MED ORDER — FUROSEMIDE 10 MG/ML IJ SOLN
80.0000 mg | Freq: Once | INTRAMUSCULAR | Status: AC
  Administered 2017-09-16: 80 mg via INTRAVENOUS
  Filled 2017-09-16: qty 8

## 2017-09-16 MED ORDER — INSULIN ASPART 100 UNIT/ML ~~LOC~~ SOLN: 0.0000 [IU] | Freq: Every day | SUBCUTANEOUS | Status: DC

## 2017-09-16 MED ORDER — CARVEDILOL 3.125 MG PO TABS: 3.1250 mg | ORAL_TABLET | Freq: Two times a day (BID) | ORAL | Status: DC

## 2017-09-16 MED ORDER — ATORVASTATIN CALCIUM 20 MG PO TABS
20.0000 mg | ORAL_TABLET | Freq: Every day | ORAL | Status: DC
  Administered 2017-09-17 – 2017-09-18 (×2): 20 mg via ORAL
  Filled 2017-09-16 (×2): qty 1

## 2017-09-16 MED ORDER — VITAMIN D3 25 MCG (1000 UNIT) PO TABS
2000.0000 [IU] | ORAL_TABLET | Freq: Every day | ORAL | Status: DC
  Administered 2017-09-17 – 2017-09-18 (×2): 2000 [IU] via ORAL
  Filled 2017-09-16 (×4): qty 2

## 2017-09-16 MED ORDER — SENNOSIDES-DOCUSATE SODIUM 8.6-50 MG PO TABS: 1.0000 | ORAL_TABLET | Freq: Every evening | ORAL | Status: DC | PRN

## 2017-09-16 NOTE — ED Notes (Signed)
Admitting at bedside 

## 2017-09-16 NOTE — ED Notes (Signed)
Pt's O2 drops when sleeping to 88%- pt wears O2 at home at night at 2Lm.[place]--  O2 placed at 2L//[place]

## 2017-09-16 NOTE — ED Provider Notes (Signed)
Emergency Department Provider Note   I have reviewed the triage vital signs and the nursing notes.   HISTORY  Chief Complaint Congestive Heart Failure and Shortness of Breath   HPI Crystal Yates is a 62 y.o. female with multiple medical problems as documented below the presents to the emergency department today with 1 week of progressively worsening dyspnea.  Patient states that she gets short of breath with any kind of exertion and has had increased leg swelling.  She is increased her Lasix during this time per her cardiology recommendations however this did not seem to improve her symptoms.  She states that she does have yellowish productive cough.  No fevers.  No significant chest pain. No other associated or modifying symptoms.    Past Medical History:  Diagnosis Date  . CAD in native artery    a. Prior LAD stenting based on cath. b. RCA stenting 03/2016 x2.  . Chronic combined systolic and diastolic CHF (congestive heart failure) ([place])   . CKD (chronic kidney disease), stage II   . COPD (chronic obstructive pulmonary disease) ([place])   . Diabetes mellitus without complication ([place])   . Hashimoto's thyroiditis   . Hyperlipidemia   . Hypertension   . Secondary adrenal insufficiency ([place])   . Thyroid disease   . Tobacco abuse     Patient Active Problem List   Diagnosis Date Noted  . Acute respiratory distress 09/16/2017  . Right upper lobe pneumonia ([place]) 05/06/2017  . Pneumonia due to respiratory syncytial virus (RSV) 04/21/2017  . Entrapment, radial nerve, right 03/31/2017  . COPD exacerbation ([place]) 10/14/2016  . Chronic combined systolic and diastolic CHF (congestive heart failure) ([place]) 10/13/2016  . Chest tightness or pressure 10/12/2016  . Dyspnea on exertion 09/29/2016  . History of coronary artery disease 09/29/2016  . OAB (overactive bladder) 09/21/2016  . Neck pain 09/08/2016  . Paresthesia of arm 09/08/2016  . Flank pain 08/31/2016  . Oxygen dependent  06/20/2016  . Neuralgia of right upper extremity 04/26/2016  . Coronary artery disease 04/16/2016  . Lymphocele after surgical procedure 04/16/2016  . Ventricular tachycardia (paroxysmal) ([place]) 04/09/2016  . Chronic systolic heart failure ([place]) 04/08/2016  . Ischemic cardiomyopathy 04/08/2016  . Type 2 diabetes mellitus without complication, with long-term current use of insulin ([place]) 04/08/2016  . Anemia, blood loss 09/16/2015  . GI bleeding 09/16/2015  . Precordial pain 09/14/2015  . Chest pain 05/22/2015  . Hypokalemia 05/22/2015  . Hypothyroid 05/22/2015  . Status post hernia repair 01/29/2015  . Surgical wound breakdown 01/29/2015  . Hernia with strangulation 01/18/2015  . Hyperkalemia 01/18/2015  . Tobacco abuse 01/18/2015  . Recurrent incisional hernia with incarceration 01/15/2015  . Sleep apnea 01/15/2015  . S/P drug eluting coronary stent placement 10/16/2014  . Adrenal insufficiency ([place]) 11/21/2013  . Anemia 11/21/2013  . Unstable angina ([place]) 11/21/2013  . Atherosclerotic heart disease of native coronary artery without angina pectoris 11/21/2013  . Cardiomyopathy ([place]) 11/21/2013  . Congestive heart failure ([place]) 11/21/2013  . Diabetes mellitus with no complication ([place]) 73/71/0626  . Edema 11/21/2013  . Empty sella syndrome ([place]) 11/21/2013  . Hepatitis 11/21/2013  . Hyperlipemia 11/21/2013  . Hypertension 11/21/2013  . Kidney cyst, acquired 11/21/2013  . Lymph nodes enlarged 11/21/2013  . Fat necrosis 11/21/2013  . Other emphysema ([place]) 11/21/2013  . Renal disorder 11/21/2013  . Shortness of breath 11/21/2013  . Sjogrens syndrome ([place]) 11/21/2013  . Thyroid disorder 11/21/2013  . Nicotine dependence, uncomplicated 94/85/4627  .  Major depressive disorder, recurrent episode ([place]) 09/17/2013  . Displacement of lumbar intervertebral disc 06/14/2013  . Intractable migraine without aura 06/14/2013  . Mild cognitive impairment 06/14/2013  . Polyneuropathy in  diabetes ([place]) 06/14/2013  . Adrenal cortex insufficiency ([place]) 04/12/2011  . DDD (degenerative disc disease), lumbosacral 01/11/2008  . Thoracic or lumbosacral neuritis or radiculitis 01/11/2008    Past Surgical History:  Procedure Laterality Date  . ABDOMINAL SURGERY    . CESAREAN SECTION    . CHOLECYSTECTOMY    . COLON RESECTION    . HERNIA MESH REMOVAL    . HERNIA REPAIR    . LEFT HEART CATH AND CORONARY ANGIOGRAPHY N/A 10/13/2016   Procedure: Left Heart Cath and Coronary Angiography;  Surgeon: Nelva Bush, MD;  Location: [facility];  Service: Cardiovascular;  Laterality: N/A;  . SHOULDER ARTHROSCOPY    . TUBAL LIGATION        Allergies Hydroxychloroquine; Donepezil; Prednisone; Bupropion; Metrizamide; Varenicline; and Tape  Family History  Problem Relation Age of Onset  . Stroke Mother   . Diabetes Father   . Diabetes Sister   . Diabetes Sister   . Diabetes Son     Social History Social History   Tobacco Use  . Smoking status: Current Every Day Smoker    Packs/day: 0.75    Types: Cigarettes  . Smokeless tobacco: Never Used  Substance Use Topics  . Alcohol use: Yes    Comment: 1 drink every day  . Drug use: Yes    Types: Marijuana    Comment: occ    Review of Systems  All other systems negative except as documented in the HPI. All pertinent positives and negatives as reviewed in the HPI. ____________________________________________   PHYSICAL EXAM:  VITAL SIGNS: ED Triage Vitals [09/16/17 1420]  Enc Vitals Group     BP 127/80     Pulse Rate 86     Resp 20     Temp (!) 97.4 F (36.3 C)     Temp Source Oral     SpO2 95 %     Weight 213 lb (96.6 kg)     Height 5\' 2"  (1.575 m)     Head Circumference      Peak Flow      Pain Score 2     Pain Loc      Pain Edu?      Excl. in [place]?     Constitutional: Alert and oriented. Well appearing and in no acute distress. Eyes: Conjunctivae are normal. PERRL. EOMI. Head: Atraumatic. Nose:  No congestion/rhinnorhea. Mouth/Throat: Mucous membranes are moist.  Oropharynx non-erythematous. Neck: No stridor.  No meningeal signs.   Cardiovascular: Normal rate, regular rhythm. Good peripheral circulation. Grossly normal heart sounds.   Respiratory: tachypneic respiratory effort.  No retractions. Lungs diminished with wheezing. Gastrointestinal: Soft and nontender. Mild distention.  Musculoskeletal: No lower extremity tenderness nor edema. No gross deformities of extremities. Neurologic:  Normal speech and language. No gross focal neurologic deficits are appreciated.  Skin:  Skin is warm, dry and intact. No rash noted.  ____________________________________________   LABS (all labs ordered are listed, but only abnormal results are displayed)  Labs Reviewed  BASIC METABOLIC PANEL - Abnormal; Notable for the following components:      Result Value   Chloride 98 (*)    CO2 33 (*)    Glucose, Bld 225 (*)    BUN 27 (*)    Creatinine, Ser 1.18 (*)  GFR calc non Af Amer 48 (*)    GFR calc Af Amer 56 (*)    All other components within normal limits  CBC - Abnormal; Notable for the following components:   WBC 14.2 (*)    RDW 16.1 (*)    All other components within normal limits  BRAIN NATRIURETIC PEPTIDE - Abnormal; Notable for the following components:   B Natriuretic Peptide 118.6 (*)    All other components within normal limits  CBC - Abnormal; Notable for the following components:   RDW 16.1 (*)    All other components within normal limits  CREATININE, SERUM - Abnormal; Notable for the following components:   Creatinine, Ser 1.09 (*)    GFR calc non Af Amer 53 (*)    All other components within normal limits  TSH  TROPONIN I  GLUCOSE, CAPILLARY  HEMOGLOBIN A1C  CBC  COMPREHENSIVE METABOLIC PANEL  I-STAT TROPONIN, ED   ____________________________________________  EKG   EKG Interpretation  Date/Time:  Saturday September 16 2017 14:17:40 EDT Ventricular Rate:   77 PR Interval:  132 QRS Duration: 92 QT Interval:  430 QTC Calculation: 486 R Axis:   65 Text Interpretation:  Normal sinus rhythm with sinus arrhythmia Cannot rule out Anterior infarct , age undetermined Abnormal ECG No significant change since last tracing Confirmed by Merrily Pew [phone]) on 09/16/2017 4:12:12 PM       ____________________________________________  RADIOLOGY  Dg Chest Portable 1 View  Result Date: 09/16/2017 CLINICAL DATA:  Shortness of breath. EXAM: PORTABLE CHEST 1 VIEW COMPARISON:  Radiograph of September 10, 2017. FINDINGS: The heart size and mediastinal contours are within normal limits. No pneumothorax is noted. Minimal bibasilar subsegmental atelectasis is noted. Minimal left pleural effusion may be present. The visualized skeletal structures are unremarkable. IMPRESSION: Minimal bibasilar subsegmental atelectasis. Minimal left pleural effusion. Electronically Signed   By: Marijo Conception, M.D.   On: 09/16/2017 16:02    ____________________________________________   PROCEDURES  Procedure(s) performed:   Procedures   ____________________________________________   INITIAL IMPRESSION / ASSESSMENT AND PLAN / ED COURSE  Suspect a combination of CHF and COPD as a cause for the symptoms.  Will start with a breathing treatment to see how much relief she gets with that she gets significant relief and increased increase in her aeration will consider steroids and antibiotics that she does have a yellow productive cough however if this does not seem to help much we will give some Lasix.  Objectively the patient's lungs sound much better after breathing treatment however she states that she still feels short of breath and just also lower extremity swelling so we will give Lasix and admit to hospital for further management.  I still think most of her symptoms are from COPD rather than CHF.   Pertinent labs & imaging results that were available during my care of the  patient were reviewed by me and considered in my medical decision making (see chart for details).  ____________________________________________  FINAL CLINICAL IMPRESSION(S) / ED DIAGNOSES  Final diagnoses:  Congestive heart failure, unspecified HF chronicity, unspecified heart failure type (HCC)  COPD exacerbation (HCC)     MEDICATIONS GIVEN DURING THIS VISIT:  Medications  ipratropium-albuterol (DUONEB) 0.5-2.5 (3) MG/3ML nebulizer solution 3 mL (3 mLs Nebulization Given 09/16/17 1635)  acetaminophen (TYLENOL) tablet 650 mg (650 mg Oral Given 09/16/17 2330)    Or  acetaminophen (TYLENOL) suppository 650 mg ( Rectal See Alternative 09/16/17 2330)  senna-docusate (Senokot-S) tablet 1 tablet (has  no administration in time range)  aspirin EC tablet 81 mg (has no administration in time range)  atorvastatin (LIPITOR) tablet 20 mg (has no administration in time range)  carvedilol (COREG) tablet 3.125 mg (has no administration in time range)  cholecalciferol (VITAMIN D) tablet 2,000 Units (has no administration in time range)  clopidogrel (PLAVIX) tablet 75 mg (has no administration in time range)  DULoxetine (CYMBALTA) DR capsule 60 mg (has no administration in time range)  hydrocortisone (CORTEF) tablet 10 mg (has no administration in time range)  isosorbide mononitrate (IMDUR) 24 hr tablet 60 mg (has no administration in time range)  levothyroxine (SYNTHROID, LEVOTHROID) tablet 125 mcg (has no administration in time range)  pantoprazole (PROTONIX) EC tablet 40 mg (has no administration in time range)  sacubitril-valsartan (ENTRESTO) 24-26 mg per tablet (1 tablet Oral Given 09/16/17 2330)  insulin aspart (novoLOG) injection 0-20 Units (has no administration in time range)  insulin aspart (novoLOG) injection 0-5 Units (0 Units Subcutaneous Not Given 09/16/17 2306)  nicotine (NICODERM CQ - dosed in mg/24 hours) patch 14 mg (14 mg Transdermal Patch Applied 09/16/17 2330)  furosemide (LASIX)  injection 80 mg (has no administration in time range)  hydrocortisone (CORTEF) tablet 5 mg (has no administration in time range)  fluticasone furoate-vilanterol (BREO ELLIPTA) 100-25 MCG/INH 1 puff (has no administration in time range)  acetaminophen (TYLENOL) tablet 650 mg (650 mg Oral Given 09/16/17 1635)  fentaNYL (SUBLIMAZE) injection 50 mcg (50 mcg Intravenous Given 09/16/17 1635)  furosemide (LASIX) injection 80 mg (80 mg Intravenous Given 09/16/17 1839)     NEW OUTPATIENT MEDICATIONS STARTED DURING THIS VISIT:  Current Discharge Medication List      Note:  This note was prepared with assistance of Dragon voice recognition software. Occasional wrong-word or sound-a-like substitutions may have occurred due to the inherent limitations of voice recognition software.   Panagiotis Oelkers, Corene Cornea, MD 09/17/17 [phone]

## 2017-09-16 NOTE — ED Notes (Signed)
Pt is short of breath after getting into bed- will order portable.

## 2017-09-16 NOTE — H&P (Addendum)
Date: 09/16/2017               Patient Name:  Crystal Yates MRN: [id number]  DOB: March 12, 1956 Age / Sex: 62 y.o., female   PCP: Dortha Kern, PA         Medical Service: Internal Medicine Teaching Service         Attending Physician: Dr. Evette Doffing, Mallie Mussel, *    First Contact: Dr. Amado Coe Pager: [phone]  Second Contact: Dr. Danford Bad Pager: [phone]       After Hours (After 5p/  First Contact Pager: [phone]  weekends / holidays): Second Contact Pager: [phone]   Chief Complaint: Shortness of breath  History of Present Illness: Crystal Yates is a 62 y.o. female with CAD S/P PCI 1/18, HFrEF (56 %-1/19),CKD II, COPD( Home O2 2L),DM, HTN, secondary adrenal insufficiency, thyroid disease and a current active smoker (43 pack year) who presented with one week history of worsening dyspnea. The patient states that the shortness of breath is worsened when she stands up and walks. She has been having to prop herself up more in her adjustable bed when she lays down.  She has accompanied with abdominal tightness and bilateral leg swelling.   The patient states that she had 3-4 episodes of chest pressure this past week. The last episode was the morning of admission lasting 10 mins when she was walking around the house and it resolved after she rested. She did not take any nitroglycerin.  Patient states that she usually weighs 200lbs and yesterday (6/21) she weighed 213lbs. The patient's lasix was recently increased from 40mg  bid to 80mg  bid on 09/13/17 due to dyspnea on exertion. The patient states that she has been compliant with her medication, but she continues to eat foods that are high in salt content.   Three weeks ago the patient went on a long car drive to [place].   She notes increased dry cough, chills, nausea, fatigue, muscle cramps, and chronic urinary incontinence. Denies nasal congestion, sneezing, fever, or diarrhea.   ED Course: Afebrile, normal pulse, respiration ranging  17-22, 97-127/59-80, SpO2=91-96% Chest xray showing mild left pleura effusion without any consolidation or infiltrate  BNP=118, negative troponin, wbc=14.2 Was given duonebs  Meds:  No current facility-administered medications on file prior to encounter.    Current Outpatient Medications on File Prior to Encounter  Medication Sig Dispense Refill  . aspirin EC 81 MG tablet Take by mouth.    Marland Kitchen atorvastatin (LIPITOR) 20 MG tablet Take 20 mg by mouth daily.    . carvedilol (COREG) 3.125 MG tablet Take 3.125 mg by mouth 2 (two) times daily with a meal.    . Cholecalciferol (VITAMIN D3) 2000 units capsule Take 2,000 Units by mouth daily.     . clopidogrel (PLAVIX) 75 MG tablet Take 75 mg by mouth daily.    . DULoxetine (CYMBALTA) 60 MG capsule take one 60mg  capsule  0  . EMGALITY 120 MG/ML SOAJ Inject 120 mg into the skin every 30 (thirty) days.  3  . furosemide (LASIX) 80 MG tablet Take 80 mg (2 tablets) twice daily.    . hydrocortisone (CORTEF) 5 MG tablet Take 5-10 mg by mouth See admin instructions. 10 mg in the morning then 5 mg midday    . insulin degludec (TRESIBA FLEXTOUCH) 100 UNIT/ML SOPN FlexTouch Pen Inject 33 Units into the skin at bedtime.     . Iron-FA-B Cmp-C-Biot-Probiotic (FUSION PLUS) CAPS Take 1 capsule by mouth daily.  5  .  isosorbide mononitrate (IMDUR) 60 MG 24 hr tablet Take 1 tablet (60 mg total) by mouth daily. 60 tablet 2  . ketoconazole (NIZORAL) 2 % cream Apply 1 fingertip amount to toenails daily 30 g 0  . levothyroxine (SYNTHROID) 125 MCG tablet Take 125 mcg by mouth daily.     . Multiple Vitamins-Minerals (EMERGEN-C IMMUNE PO) Take 1 capsule by mouth daily. CHEW    . nitroGLYCERIN (NITROSTAT) 0.4 MG SL tablet Place 0.4 mg under the tongue every 5 (five) minutes as needed for chest pain.     Marland Kitchen omeprazole (PRILOSEC) 40 MG capsule Take 40 mg by mouth daily.    . potassium chloride SA (K-DUR,KLOR-CON) 20 MEQ tablet Take 1 tablet (20 mEq total) by mouth daily. 90  tablet 3  . pramipexole (MIRAPEX) 1 MG tablet Take 1 mg by mouth 2 (two) times daily.    . sacubitril-valsartan (ENTRESTO) 24-26 MG Take 1 tablet by mouth 2 (two) times daily. 60 tablet 11  . TRELEGY ELLIPTA 100-62.5-25 MCG/INH AEPB Inhale 1 puff into the lungs daily.  3  . TRULICITY 1.5 KK/9.3GH SOPN Inject 1.5 mg into the skin every 7 (seven) days.  4   Allergies: Allergies as of 09/16/2017 - Review Complete 09/16/2017  Allergen Reaction Noted  . Hydroxychloroquine Shortness Of Breath, Nausea Only, and Other (See Comments) 11/21/2013  . Donepezil Other (See Comments) 10/16/2013  . Prednisone Other (See Comments) 09/02/2013  . Bupropion Other (See Comments) 07/15/2016  . Metrizamide Other (See Comments) 10/05/2016  . Varenicline Other (See Comments) 07/15/2016  . Tape Rash 10/12/2016   Past Medical History:  Diagnosis Date  . CAD in native artery    a. Prior LAD stenting based on cath. b. RCA stenting 03/2016 x2.  . Chronic combined systolic and diastolic CHF (congestive heart failure) ([place])   . CKD (chronic kidney disease), stage II   . COPD (chronic obstructive pulmonary disease) ([place])   . Diabetes mellitus without complication ([place])   . Hashimoto's thyroiditis   . Hyperlipidemia   . Hypertension   . Secondary adrenal insufficiency ([place])   . Thyroid disease   . Tobacco abuse     Family History:  Family History  Problem Relation Age of Onset  . Stroke Mother   . Diabetes Father   . Diabetes Sister   . Diabetes Sister   . Diabetes Son    Social History:  Social History   Socioeconomic History  . Marital status: Married    Spouse name: Not on file  . Number of children: Not on file  . Years of education: Not on file  . Highest education level: Not on file  Occupational History  . Not on file  Social Needs  . Financial resource strain: Not on file  . Food insecurity:    Worry: Not on file    Inability: Not on file  . Transportation needs:    Medical: Not on  file    Non-medical: Not on file  Tobacco Use  . Smoking status: Current Every Day Smoker    Packs/day: 0.75    Types: Cigarettes  . Smokeless tobacco: Never Used  Substance and Sexual Activity  . Alcohol use: Yes    Comment: 1 drink every day  . Drug use: Yes    Types: Marijuana    Comment: occ  . Sexual activity: Not on file  Lifestyle  . Physical activity:    Days per week: Not on file    Minutes per session: Not  on file  . Stress: Not on file  Relationships  . Social connections:    Talks on phone: Not on file    Gets together: Not on file    Attends religious service: Not on file    Active member of club or organization: Not on file    Attends meetings of clubs or organizations: Not on file    Relationship status: Not on file  Other Topics Concern  . Not on file  Social History Narrative  . Not on file  Smoker for >56yrs Crystal Yates 1-2 shots per day Marijuana  Review of Systems: A complete ROS was negative except as per HPI.   Physical Exam: Blood pressure 125/78, pulse 86, temperature 97.9 F (36.6 C), temperature source Oral, resp. rate 14, height 5\' 2"  (1.575 m), weight 212 lb 8 oz (96.4 kg), SpO2 100 %.  Physical Exam  Constitutional: She appears well-developed and well-nourished. No distress.  HENT:  Head: Normocephalic and atraumatic.  Eyes: Conjunctivae are normal.  Neck: No JVD (difficult to assess due to obese body habitus) present.  Cardiovascular: Normal rate, regular rhythm, normal heart sounds and intact distal pulses.  No murmur heard. Respiratory: Effort normal. No respiratory distress. She has wheezes (diffuse wheezing throughout lung fields). She has no rales. She exhibits no tenderness.  GI: Soft. Bowel sounds are normal. She exhibits distension. There is tenderness (ruq and lower abdomen). There is guarding.  Umbilical area with scar tissue, no open wounds or erythema. Fullness noted in ruq.  Musculoskeletal: She exhibits edema (2+ in left  lower extremity, 1+ on right lower extremity) and tenderness (left knee).  Neurological: She is alert.  Skin: She is not diaphoretic. No erythema.  Psychiatric: She has a normal mood and affect. Her behavior is normal. Judgment and thought content normal.   EKG: personally reviewed my interpretation is normal sinus rhythm without any st or t wave changes.  CXR: personally reviewed my interpretation is there is a no consolidation or infiltrate visualized. Small left pleural effusion seen.  Assessment & Plan by Problem:  Ms. Deidrea is a 62 y.o female presenting with 1 week history of worsening dyspnea on mild exertion with 3-4 episodes of chest pressure lasting 81min and resolving with rest. Patient is afebrile with a mild leukocytosis to 14. BNP=118 and no significant signs of volume overload noted on exam. Troponin negative and ekg without any st or t wave changes. Chest xray showing mild left pleural effusion, but without infiltrate or consolidation. Patient is thought to be having a mild copd exacerbation and will be managed with breathing treatments. She will also be ruled out for heart failure exacerbation.  Acute respiratory distress The patient reports dyspnea on exertion for the past 1 week with mild exertion (walking few steps and with sitting up) along with orthopnea which is forcing her to set her bed at an elevated angle. The patient is on 2L oxygen via nasal cannula chronically and the patient has not had any increased work of breathing noted on exam nor has she had an increased oxygen requirement. The patient likely does not have an infection process going on currently as she remains afebrile and only has a mild leukocytosis of 14. Troponin is negative thusfar and ekg does not show any st or t wave changes to suggest acs. Patient is not anemic to consider it as a cause of dyspnea. BNP=118, the patient did not appear volume overloaded on exam (no crackles, no jvd, or bilateral pitting  edema).  The patient's bilateral lower extremities are symmetric and there is no increased work of breathing to place pulmonary embolism high on the differential for patient's respiratory distress. However, the patient has a moderate wells score due to left leg pitting edema. The patient had diffuse wheezing throughout lung fields causing concern for a mild copd exacerbation.  Patient has a moderate risk for dvt based on wells criteria (pitting edema confided to symptomatic leg, and left leg swelling).  -Continue fluticasone furoate-vilanterol (breo) -Duonebs q4hrs  -Ordered echo to assess for possible progression of heart failure -D-dimer ordered  HFrEF The patient's last echo on January 2019 showed lvef=40%, moderately dilated left ventricle, normal left atrium, hypokinesis of basal-midinferolateral and inferior myocardium, regional wall motion abnormalities, and no dilation of aorta.   The patient was recently evaluated by cardiology on 09/13/17 during which time she was told to increase her lasix dosage to 80mg  bid due to worsening dyspnea. The patient was told to try the increase dose and if the dyspnea was not improved was told to go to hospital.  Patient may have some gut edema secondary to recent abdominal surgeries (incisional herniorrhaphy in March 2019) that can be leading to decreased bioavailability of lasix. May need to consider using torsemide that has higher bioavailability.   -Echo ordered to evaluate for possible worsening of heart failure -IV Lasix 80mg  qd  -Monitor I/O -Held entresto 24-26 bid -Held coreg 3.125mg  bid   Non-obstructive CAD s/p PCI in 03/2016 Patient had a left heart cath done in July 2018 which showed non-obstructive cad, patent stents in lad and rca with mild to moderate in stent restenosis, elevated left ventricular filling pressure, and moderate to severely reduced lved with global hypokinesis most pronounced along inferior wall and apex. An rca stent was  placed proximally and distally on 04/11/16.  -Held imdur 60mg  qd due to decreased blood pressure -continue aspirin 81mg  qd and plavix 75mg  qd.  Secondary Adrenal insufficiency Patient's blood pressure is low ranging 80-90/50-60. Increased hydrocortisone to 20mg  qbreakfast and 5mg  qd instead of her home hydrocortisone 10mg  in morning and 5mg  in midday.  -Continue to monitor blood pressure  Hypothyroidism  -TSH pending -Continued home levothyroxine 16mcg qd  Diabetes Mellitus  The patient is on trulicity 1.5mg  q7days and tresiba 33u qhs. Last a1c=6.1 in July 2018.   -Pending HbA1C -SSI resistant with novolog qhs coverage  Hx of recurrent incisional hernia with incarceration The patient was evaluated by [facility] general surgery for incisional herniorrhaphy with mesh post operative visit on 09/13/17. She was told that there was no hernia recurrence, SBO, or colon obstruction and there was no surgical intervention needed at this time.    Dispo: Admit patient to Inpatient with expected length of stay greater than 2 midnights.  SignedLars Mage, MD 09/16/2017, 11:25 PM  Pager: [phone]

## 2017-09-16 NOTE — ED Notes (Signed)
Pt sleeping. NAD noted 

## 2017-09-16 NOTE — ED Triage Notes (Signed)
Pt endorses shob x 1 week and biltaral leg pain. Hx of chf and this feels the same. Evaluated for blood clots recently and had [place] which was negative. VSS. Speaking in complete sentences.

## 2017-09-17 DIAGNOSIS — J449 Chronic obstructive pulmonary disease, unspecified: Secondary | ICD-10-CM | POA: Diagnosis not present

## 2017-09-17 DIAGNOSIS — I5023 Acute on chronic systolic (congestive) heart failure: Secondary | ICD-10-CM | POA: Diagnosis not present

## 2017-09-17 DIAGNOSIS — I2510 Atherosclerotic heart disease of native coronary artery without angina pectoris: Secondary | ICD-10-CM

## 2017-09-17 DIAGNOSIS — Z79890 Hormone replacement therapy (postmenopausal): Secondary | ICD-10-CM

## 2017-09-17 DIAGNOSIS — E039 Hypothyroidism, unspecified: Secondary | ICD-10-CM

## 2017-09-17 DIAGNOSIS — E1165 Type 2 diabetes mellitus with hyperglycemia: Secondary | ICD-10-CM

## 2017-09-17 DIAGNOSIS — Z9981 Dependence on supplemental oxygen: Secondary | ICD-10-CM

## 2017-09-17 DIAGNOSIS — Z955 Presence of coronary angioplasty implant and graft: Secondary | ICD-10-CM

## 2017-09-17 DIAGNOSIS — E2749 Other adrenocortical insufficiency: Secondary | ICD-10-CM

## 2017-09-17 LAB — COMPREHENSIVE METABOLIC PANEL
ALK PHOS: 86 U/L (ref 38–126)
ALT: 22 U/L (ref 14–54)
ANION GAP: 7 (ref 5–15)
AST: 18 U/L (ref 15–41)
Albumin: 2.5 g/dL — ABNORMAL LOW (ref 3.5–5.0)
BILIRUBIN TOTAL: 0.7 mg/dL (ref 0.3–1.2)
BUN: 25 mg/dL — ABNORMAL HIGH (ref 6–20)
CALCIUM: 8.5 mg/dL — AB (ref 8.9–10.3)
CO2: 36 mmol/L — ABNORMAL HIGH (ref 22–32)
Chloride: 96 mmol/L — ABNORMAL LOW (ref 101–111)
Creatinine, Ser: 1.27 mg/dL — ABNORMAL HIGH (ref 0.44–1.00)
GFR, EST AFRICAN AMERICAN: 51 mL/min — AB (ref >60)
GFR, EST NON AFRICAN AMERICAN: 44 mL/min — AB (ref >60)
Glucose, Bld: 239 mg/dL — ABNORMAL HIGH (ref 65–99)
Potassium: 3.8 mmol/L (ref 3.5–5.1)
Sodium: 139 mmol/L (ref 135–145)
TOTAL PROTEIN: 5.4 g/dL — AB (ref 6.5–8.1)

## 2017-09-17 LAB — GLUCOSE, CAPILLARY
GLUCOSE-CAPILLARY: 55 mg/dL — AB (ref 65–99)
GLUCOSE-CAPILLARY: 474 mg/dL — AB (ref 65–99)
GLUCOSE-CAPILLARY: 389 mg/dL — AB (ref 65–99)
Glucose-Capillary: 198 mg/dL — ABNORMAL HIGH (ref 65–99)
Glucose-Capillary: 275 mg/dL — ABNORMAL HIGH (ref 65–99)
Glucose-Capillary: 504 mg/dL (ref 65–99)
Glucose-Capillary: 67 mg/dL (ref 65–99)
Glucose-Capillary: 70 mg/dL (ref 65–99)
Glucose-Capillary: 96 mg/dL (ref 65–99)

## 2017-09-17 LAB — CBC
HEMATOCRIT: 42.2 % (ref 36.0–46.0)
HEMOGLOBIN: 13.3 g/dL (ref 12.0–15.0)
MCH: 29 pg (ref 26.0–34.0)
MCHC: 31.5 g/dL (ref 30.0–36.0)
MCV: 91.9 fL (ref 78.0–100.0)
Platelets: 169 10*3/uL (ref 150–400)
RBC: 4.59 MIL/uL (ref 3.87–5.11)
RDW: 16.2 % — ABNORMAL HIGH (ref 11.5–15.5)
WBC: 10.8 10*3/uL — ABNORMAL HIGH (ref 4.0–10.5)

## 2017-09-17 LAB — BASIC METABOLIC PANEL
ANION GAP: 11 (ref 5–15)
BUN: 29 mg/dL — ABNORMAL HIGH (ref 6–20)
CALCIUM: 8.2 mg/dL — AB (ref 8.9–10.3)
CO2: 29 mmol/L (ref 22–32)
CREATININE: 1.3 mg/dL — AB (ref 0.44–1.00)
Chloride: 95 mmol/L — ABNORMAL LOW (ref 101–111)
GFR, EST AFRICAN AMERICAN: 50 mL/min — AB (ref >60)
GFR, EST NON AFRICAN AMERICAN: 43 mL/min — AB (ref >60)
Glucose, Bld: 416 mg/dL — ABNORMAL HIGH (ref 65–99)
Potassium: 4 mmol/L (ref 3.5–5.1)
SODIUM: 135 mmol/L (ref 135–145)

## 2017-09-17 LAB — GLUCOSE, RANDOM
Glucose, Bld: 520 mg/dL (ref 65–99)
Glucose, Bld: 129 mg/dL — ABNORMAL HIGH (ref 65–99)

## 2017-09-17 LAB — D-DIMER, QUANTITATIVE: D-Dimer, Quant: 0.35 ug/mL-FEU (ref 0.00–0.50)

## 2017-09-17 LAB — MAGNESIUM: MAGNESIUM: 1.6 mg/dL — AB (ref 1.7–2.4)

## 2017-09-17 LAB — HEMOGLOBIN A1C
Hgb A1c MFr Bld: 10.5 % — ABNORMAL HIGH (ref 4.8–5.6)
Mean Plasma Glucose: 254.65 mg/dL

## 2017-09-17 MED ORDER — FUROSEMIDE 10 MG/ML IJ SOLN: 80.0000 mg | Freq: Every day | INTRAMUSCULAR | Status: DC

## 2017-09-17 MED ORDER — INSULIN ASPART 100 UNIT/ML ~~LOC~~ SOLN
0.0000 [IU] | SUBCUTANEOUS | Status: DC
  Administered 2017-09-17: 20 [IU] via SUBCUTANEOUS

## 2017-09-17 MED ORDER — MAGNESIUM SULFATE 4 GM/100ML IV SOLN
4.0000 g | Freq: Once | INTRAVENOUS | Status: AC
  Administered 2017-09-17: 4 g via INTRAVENOUS
  Filled 2017-09-17: qty 100

## 2017-09-17 MED ORDER — FUROSEMIDE 10 MG/ML IJ SOLN
80.0000 mg | Freq: Two times a day (BID) | INTRAMUSCULAR | Status: DC
  Administered 2017-09-17 – 2017-09-18 (×3): 80 mg via INTRAVENOUS
  Filled 2017-09-17 (×3): qty 8

## 2017-09-17 MED ORDER — TRAMADOL HCL 50 MG PO TABS
50.0000 mg | ORAL_TABLET | Freq: Once | ORAL | Status: AC
  Administered 2017-09-17: 50 mg via ORAL
  Filled 2017-09-17: qty 1

## 2017-09-17 MED ORDER — HYDROCORTISONE 10 MG PO TABS
10.0000 mg | ORAL_TABLET | ORAL | Status: DC
  Administered 2017-09-17: 10 mg via ORAL
  Filled 2017-09-17 (×2): qty 1

## 2017-09-17 MED ORDER — IPRATROPIUM-ALBUTEROL 0.5-2.5 (3) MG/3ML IN SOLN
3.0000 mL | Freq: Three times a day (TID) | RESPIRATORY_TRACT | Status: DC
  Administered 2017-09-17: 3 mL via RESPIRATORY_TRACT
  Filled 2017-09-17: qty 3

## 2017-09-17 MED ORDER — INSULIN GLARGINE 100 UNIT/ML ~~LOC~~ SOLN
10.0000 [IU] | Freq: Every day | SUBCUTANEOUS | Status: DC
  Administered 2017-09-17: 10 [IU] via SUBCUTANEOUS
  Filled 2017-09-17: qty 0.1

## 2017-09-17 MED ORDER — ALBUTEROL SULFATE (2.5 MG/3ML) 0.083% IN NEBU: 2.5000 mg | INHALATION_SOLUTION | RESPIRATORY_TRACT | Status: DC | PRN

## 2017-09-17 MED ORDER — HYDROCORTISONE 20 MG PO TABS
20.0000 mg | ORAL_TABLET | Freq: Every day | ORAL | Status: DC
  Administered 2017-09-17 – 2017-09-18 (×2): 20 mg via ORAL
  Filled 2017-09-17: qty 1

## 2017-09-17 MED ORDER — IPRATROPIUM-ALBUTEROL 0.5-2.5 (3) MG/3ML IN SOLN: 3.0000 mL | Freq: Four times a day (QID) | RESPIRATORY_TRACT | Status: DC | PRN

## 2017-09-17 NOTE — Progress Notes (Signed)
Patient was resting during the night. BP "soft this morning" however patient was asymptomatic.  Will continue to monitor.  Geniece Akers, RN

## 2017-09-17 NOTE — Progress Notes (Signed)
   Subjective:  No acute events overnight. States she is feeling better but continues to have shortness of breath. Denies improvement in LE swelling. Denies chest pain, abdominal pain, N/V.  Objective:  Vital signs in last 24 hours: Vitals:   09/17/17 0455 09/17/17 0500 09/17/17 0831 09/17/17 1207  BP: (!) 86/64 (!) 97/52  132/72  Pulse: 76  83 (!) 58  Resp: 12  18 20   Temp: 98.5 F (36.9 C)   97.7 F (36.5 C)  TempSrc: Oral   Oral  SpO2: 98%  97% 100%  Weight:  212 lb 1.6 oz (96.2 kg)    Height:  5\' 2"  (1.575 m)     Physical Exam  Constitutional:  Chronically ill appearing female sitting up in bed in no acute distress   Neck: JVD (2-3 cm above clavicle ) present.  Cardiovascular: Normal rate, regular rhythm and normal heart sounds. Exam reveals no gallop and no friction rub.  No murmur heard. Pulmonary/Chest: Effort normal and breath sounds normal. No respiratory distress. She has no wheezes. She has no rales.  Abdominal: Soft. Bowel sounds are normal. She exhibits no distension. There is no tenderness.  Seroma noted at RUQ. Patient mildly tender to palpation in that area, but no erythema or warmth noted. Multiple scars noted as well from prior abdominal surgeries.   Musculoskeletal: She exhibits edema (2-3+ pitting edema of LLE, 1-2+ edema on RLE ).    Assessment/Plan:  Principal Problem:   Acute on chronic systolic heart failure exacerbation(HCC) Active Problems:   Adrenal insufficiency ([place])  # Acute on chronic HF exacerbation: secondary to ICM with EF 40 % on recent TTE from 03/2017. Hypervolemic on exam with JVD and LE edema L>>R which is chronic. However, given recent travel will check D-dimer as she is moderate risk for PE. Dry weight unclear, but as low as 210 lbs in 06/2017. Recorded weight on admission 213 lbs, but unclear if this is reported weight. Will continue diuresis with IV Lasix as below and monitor electrolytes as well as I/Os and daily weights.  - On  telemetry  - IV Lasix 80 mg BID  - BMP and Mag in PM, will replete lytes PRN - Holding home Coreg, Entresto, and Imdur  - Daily weights + strict I/Os   - Follow up D-dimer   # Secondary Adrenal insufficiency: On hydrocortisone 10AM/5PM at home. Hypotensive this AM. Will doubled hydrocortisone dose in the setting of acute illness.  - Hydrocortisone 20 mg AM and 10 mg PM   # COPD: Uses 2L of supplemental oxygen at night at baseline.Does have wheezing on exam, but suspect dyspnea is secondary to congestive heart failure exacerbation vs COPD exacerbation. Will continue her home meds and have duoneb available PRN.  - Duonebs q6h PRN  - Continue home Breo Ellipta  - 2L O2 [place] QHS   # Non-obstructive CAD s/p PCI to LAD and RCA in 03/2016: Continue home aspirin 81mg  qd and plavix 75mg  qd.  # Hypothyroidism: TSH normal. Continue home Synthroid 125 mcg QD.   # T2DM, uncontrolled: On Trulicity and [place] at home.  A1c 10.5.  - Lantus 10 units QHS  - SSI-S + CBG monitoring    Dispo: Anticipated discharge in approximately 2 day(s).   Welford Roche, MD 09/17/2017, 12:58 PM Pager: [phone]

## 2017-09-17 NOTE — Progress Notes (Signed)
Patient presents with bloodsugar of 44/51/70,   Patient awake, talking, in recliner chair. Snacks provided and taken by pt. Blood sugar to 70 at this time. Lunch also arrived to room and pt ate 100 %.

## 2017-09-17 NOTE — Progress Notes (Signed)
Made aware of increased heart rate when up out of bed. Pt ambulates to bathroom and has been up in chair. Patient is asymptomatic. Husband present in room

## 2017-09-17 NOTE — Progress Notes (Signed)
Hypoglycemic Event  CBG: 67  Treatment: 4oz orange juice and peanut butter  Symptoms: No symptoms reported  Follow-up CBG: Time: 0023 CBG Result:81  Possible Reasons for Event: Received lantus and 20 units of novolog.  Comments/MD notified: Teaching service resident notified.     Danae Chen A Jonah Nestle

## 2017-09-17 NOTE — Evaluation (Signed)
Occupational Therapy Evaluation Patient Details Name: Rukia Mcgillivray MRN: [id number] DOB: [birth year] Today's Date: 09/17/2017    History of Present Illness Ivone Licht is a 62 y.o. female with CAD S/P PCI 1/18, HFrEF (40 %-1/19),CKD II, COPD( Home O2 2L),DM, HTN, secondary adrenal insufficiency, thyroid disease and a current active smoker (43 pack year) who presented with one week history of worsening dyspnea. Found to have acute respiratory distress   Clinical Impression   This 62 yo female admitted with above presents to acute OT with decreased balance and increased work of breathing with activity. She will benefit from acute OT with follow up [place].    Follow Up Recommendations  Home health OT;Supervision/Assistance - 24 hour    Equipment Recommendations  None recommended by OT       Precautions / Restrictions Precautions Precautions: Fall Restrictions Weight Bearing Restrictions: No      Mobility Bed Mobility Overal bed mobility: Modified Independent             General bed mobility comments: HOB up and use of rail  Transfers Overall transfer level: Needs assistance Equipment used: None Transfers: Sit to/from Stand Sit to Stand: Min guard         General transfer comment: Able to get on and off toilet and clean herself without assist.       Balance Overall balance assessment: Needs assistance Sitting-balance support: No upper extremity supported;Feet supported Sitting balance-Leahy Scale: Good     Standing balance support: No upper extremity supported;During functional activity Standing balance-Leahy Scale: Fair Standing balance comment: standing at sink to brush teeth                           ADL either performed or assessed with clinical judgement   ADL Overall ADL's : Needs assistance/impaired Eating/Feeding: Independent;Sitting   Grooming: Min guard;Standing   Upper Body Bathing: Supervision/ safety;Set up;Sitting   Lower  Body Bathing: Minimal assistance Lower Body Bathing Details (indicate cue type and reason): min guard A sit<>stand Upper Body Dressing : Set up;Sitting   Lower Body Dressing: Minimal assistance Lower Body Dressing Details (indicate cue type and reason): min guard A sit<>stand Toilet Transfer: Min guard;Ambulation;RW;Grab bars;Regular Museum/gallery exhibitions officer and Hygiene: Min guard;Sit to/from stand         General ADL Comments: Pt reports she has tried purse lipped breathing but it does not work for her     Vision Patient Visual Report: No change from baseline              Pertinent Vitals/Pain Pain Assessment: 0-10 Pain Score: 4  Faces Pain Scale: Hurts even more Pain Location: left knee Pain Descriptors / Indicators: Aching;Discomfort Pain Intervention(s): Limited activity within patient's tolerance;Monitored during session;Repositioned     Hand Dominance Left   Extremity/Trunk Assessment Upper Extremity Assessment Upper Extremity Assessment: Defer to OT evaluation   Lower Extremity Assessment Lower Extremity Assessment: Generalized weakness   Cervical / Trunk Assessment Cervical / Trunk Assessment: Normal   Communication Communication Communication: No difficulties   Cognition Arousal/Alertness: Awake/alert Behavior During Therapy: WFL for tasks assessed/performed Overall Cognitive Status: Within Functional Limits for tasks assessed                                                Home Living Family/patient expects  to be discharged to:: Private residence Living Arrangements: Spouse/significant other Available Help at Discharge: Family;Available PRN/intermittently(husband works, alone evening hours, son in Sports coach next door) Type of Home: House Home Access: Stairs to enter Technical brewer of Steps: 4 Entrance Stairs-Rails: Left [place]: One level     Bathroom Shower/Tub: Walk-in shower;Tub/shower unit    [employer]: Standard     Home Equipment: Clinical cytogeneticist - 4 wheels;Grab bars - tub/shower;Grab bars - toilet;Cane - single point          Prior Functioning/Environment Level of Independence: Needs assistance  Gait / Transfers Assistance Needed: used rollator at times ADL's / Homemaking Assistance Needed: B/D self normally, needs help with lower body dressing at times            OT Problem List: Decreased range of motion;Cardiopulmonary status limiting activity;Impaired balance (sitting and/or standing)      OT Treatment/Interventions: Self-care/ADL training;Balance training;DME and/or AE instruction;Patient/family education;Energy conservation    OT Goals(Current goals can be found in the care plan section) Acute Rehab OT Goals Patient Stated Goal: to go home OT Goal Formulation: With patient Time For Goal Achievement: 10/01/17 Potential to Achieve Goals: Good ADL Goals Pt Will Perform Grooming: with supervision;standing Pt Will Perform Lower Body Dressing: with supervision;sit to/from stand Pt Will Transfer to Toilet: with supervision;ambulating;regular height toilet;grab bars Pt Will Perform Toileting - Clothing Manipulation and hygiene: with supervision;sit to/from stand  OT Frequency: Min 2X/week           AM-PAC PT "6 Clicks" Daily Activity     Outcome Measure Help from another person eating meals?: None Help from another person taking care of personal grooming?: A Little Help from another person toileting, which includes using toliet, bedpan, or urinal?: A Little Help from another person bathing (including washing, rinsing, drying)?: A Little Help from another person to put on and taking off regular upper body clothing?: A Little Help from another person to put on and taking off regular lower body clothing?: A Little 6 Click Score: 19   End of Session Equipment Utilized During Treatment: Gait belt;Rolling walker Nurse Communication: (Blood sugar still  dropping--pt self checking)  Activity Tolerance: Patient tolerated treatment well Patient left: in chair;with call bell/phone within reach  OT Visit Diagnosis: Unsteadiness on feet (R26.81);Muscle weakness (generalized) (M62.81)                Time: 7680-8811 OT Time Calculation (min): 50 min Charges:  OT General Charges $OT Visit: 1 Visit OT Evaluation $OT Eval Moderate Complexity: 1 Mod OT Treatments $Self Care/Home Management : 23-37 mins [place], OTR/L [phone] 09/17/2017

## 2017-09-17 NOTE — Progress Notes (Signed)
Patient presents with blood sugar of 504. MD notified and lab order entered to draw glucose.  MD orders to give currently ordered 20 units of insulin per sliding scale and have lab draw glucose again at 1900 and call results.  MD also request to have staff do CBG at bedside at 1900.

## 2017-09-17 NOTE — Progress Notes (Signed)
Blood Sugar: Nurse Tech obtained pt blood sugar with reading of 96 at 1128. Within 20 minutes patient states she feels her bloodsugar is dropping. Pt has self check monitor to posterior right arm. Pt reading of 44.  Tech rechecks glucose with meter and obtains reading of 51.  Pt is alert and oriented, Snacks provided. Lunch also arrives in room. Pt takes 100% of lunch.  Patient states they don't give you much for breakfast here. States she had one pancake and one banana.  Advised patient to order a protein with her meals, not just carbs. Examples of carbs reviewed with patient and husband. Patient would benefit from diabetic educator seeing pt.

## 2017-09-17 NOTE — Evaluation (Signed)
Physical Therapy Evaluation Patient Details Name: Crystal Yates MRN: [id number] DOB: 03/18/1956 Today's Date: 09/17/2017   History of Present Illness  Crystal Yates is a 62 y.o. female with CAD S/P PCI 1/18, HFrEF (40 %-1/19),CKD II, COPD( Home O2 2L),DM, HTN, secondary adrenal insufficiency, thyroid disease and a current active smoker (43 pack year) who presented with one week history of worsening dyspnea. Found to have acute respiratory distress  Clinical Impression  Pt admitted with above diagnosis. Pt currently with functional limitations due to the deficits listed below (see PT Problem List). Pt was able to ambulate without device without LOB.  Has device she can use if she is having a bad day at home.  Did not desat on RA with sats 95% with activity.  Should progress well.  Will follow acutely.  Pt will benefit from skilled PT to increase their independence and safety with mobility to allow discharge to the venue listed below.      Follow Up Recommendations Supervision - Intermittent;No PT follow up    Equipment Recommendations  None recommended by PT    Recommendations for Other Services       Precautions / Restrictions Precautions Precautions: Fall Restrictions Weight Bearing Restrictions: No      Mobility  Bed Mobility Overal bed mobility: Modified Independent             General bed mobility comments: HOB up and use of rail  Transfers Overall transfer level: Needs assistance Equipment used: None Transfers: Sit to/from Stand Sit to Stand: Min guard         General transfer comment: Able to get on and off toilet and clean herself without assist.   Ambulation/Gait Ambulation/Gait assistance: Min guard Gait Distance (Feet): 150 Feet Assistive device: None Gait Pattern/deviations: Step-through pattern;Decreased stride length   Gait velocity interpretation: 1.31 - 2.62 ft/sec, indicative of limited community ambulator General Gait Details: Pt was abl e  to ambulate in hallway without LOB with overall good stability and did not use device.  Pt sats 95% and >.   Stairs            Wheelchair Mobility    Modified Rankin (Stroke Patients Only)       Balance Overall balance assessment: Needs assistance Sitting-balance support: No upper extremity supported;Feet supported Sitting balance-Leahy Scale: Good     Standing balance support: No upper extremity supported;During functional activity Standing balance-Leahy Scale: Fair Standing balance comment: stands statically without assist.                              Pertinent Vitals/Pain Pain Assessment: 0-10 Pain Score: 4  Faces Pain Scale: Hurts even more Pain Location: left knee Pain Descriptors / Indicators: Aching;Discomfort Pain Intervention(s): Limited activity within patient's tolerance;Monitored during session;Repositioned    Home Living Family/patient expects to be discharged to:: Private residence Living Arrangements: Spouse/significant other Available Help at Discharge: Family;Available PRN/intermittently(husband works, alone evening hours, son in Sports coach next door) Type of Home: House Home Access: Stairs to enter Entrance Stairs-Rails: Horticulturist, commercial of Steps: 4 Home Layout: One level [place]: Clinical cytogeneticist - 4 wheels;Grab bars - tub/shower;Grab bars - toilet;Cane - single point      Prior Function Level of Independence: Needs assistance   Gait / Transfers Assistance Needed: used rollator at times  ADL's / Homemaking Assistance Needed: B/D self normally, needs help with lower body dressing at times  Hand Dominance   Dominant Hand: Left    Extremity/Trunk Assessment   Upper Extremity Assessment Upper Extremity Assessment: Defer to OT evaluation    Lower Extremity Assessment Lower Extremity Assessment: Generalized weakness    Cervical / Trunk Assessment Cervical / Trunk Assessment: Normal  Communication    Communication: No difficulties  Cognition Arousal/Alertness: Awake/alert Behavior During Therapy: WFL for tasks assessed/performed Overall Cognitive Status: Within Functional Limits for tasks assessed                                        General Comments      Exercises     Assessment/Plan    PT Assessment Patient needs continued PT services  PT Problem List Decreased activity tolerance;Decreased balance;Decreased mobility;Decreased knowledge of use of DME;Decreased safety awareness;Decreased knowledge of precautions;Cardiopulmonary status limiting activity       PT Treatment Interventions DME instruction;Gait training;Stair training;Functional mobility training;Therapeutic activities;Therapeutic exercise;Balance training;Patient/family education    PT Goals (Current goals can be found in the Care Plan section)  Acute Rehab PT Goals Patient Stated Goal: to go home PT Goal Formulation: With patient Time For Goal Achievement: 10/01/17 Potential to Achieve Goals: Good    Frequency Min 3X/week   Barriers to discharge        Co-evaluation               AM-PAC PT "6 Clicks" Daily Activity  Outcome Measure Difficulty turning over in bed (including adjusting bedclothes, sheets and blankets)?: None Difficulty moving from lying on back to sitting on the side of the bed? : None Difficulty sitting down on and standing up from a chair with arms (e.g., wheelchair, bedside commode, etc,.)?: A Little Help needed moving to and from a bed to chair (including a wheelchair)?: A Little Help needed walking in hospital room?: A Little Help needed climbing 3-5 steps with a railing? : Total 6 Click Score: 18    End of Session Equipment Utilized During Treatment: Gait belt Activity Tolerance: Patient tolerated treatment well Patient left: in bed;with call bell/phone within reach;with bed alarm set;with family/visitor present Nurse Communication: Mobility status PT  Visit Diagnosis: Muscle weakness (generalized) (M62.81)    Time: 0233-4356 PT Time Calculation (min) (ACUTE ONLY): 16 min   Charges:   PT Evaluation $PT Eval Moderate Complexity: 1 Mod     PT G Codes:        Crystal Yates,PT Acute Rehabilitation [phone] (pager)   Denice Paradise 09/17/2017, 1:01 PM

## 2017-09-18 DIAGNOSIS — I2510 Atherosclerotic heart disease of native coronary artery without angina pectoris: Secondary | ICD-10-CM | POA: Diagnosis not present

## 2017-09-18 DIAGNOSIS — E2749 Other adrenocortical insufficiency: Secondary | ICD-10-CM | POA: Diagnosis not present

## 2017-09-18 DIAGNOSIS — J449 Chronic obstructive pulmonary disease, unspecified: Secondary | ICD-10-CM | POA: Diagnosis not present

## 2017-09-18 DIAGNOSIS — E039 Hypothyroidism, unspecified: Secondary | ICD-10-CM | POA: Diagnosis not present

## 2017-09-18 LAB — BASIC METABOLIC PANEL
Anion gap: 11 (ref 5–15)
BUN: 29 mg/dL — ABNORMAL HIGH (ref 6–20)
CO2: 33 mmol/L — ABNORMAL HIGH (ref 22–32)
CREATININE: 1.2 mg/dL — AB (ref 0.44–1.00)
Calcium: 8.6 mg/dL — ABNORMAL LOW (ref 8.9–10.3)
Chloride: 94 mmol/L — ABNORMAL LOW (ref 101–111)
GFR calc Af Amer: 55 mL/min — ABNORMAL LOW (ref >60)
GFR, EST NON AFRICAN AMERICAN: 47 mL/min — AB (ref >60)
GLUCOSE: 247 mg/dL — AB (ref 65–99)
POTASSIUM: 4.6 mmol/L (ref 3.5–5.1)
SODIUM: 138 mmol/L (ref 135–145)

## 2017-09-18 LAB — GLUCOSE, CAPILLARY
GLUCOSE-CAPILLARY: 136 mg/dL — AB (ref 65–99)
GLUCOSE-CAPILLARY: 282 mg/dL — AB (ref 65–99)
GLUCOSE-CAPILLARY: 81 mg/dL (ref 65–99)
Glucose-Capillary: 221 mg/dL — ABNORMAL HIGH (ref 65–99)
Glucose-Capillary: 465 mg/dL — ABNORMAL HIGH (ref 65–99)
Glucose-Capillary: 335 mg/dL — ABNORMAL HIGH (ref 65–99)

## 2017-09-18 LAB — CBC
HCT: 43.4 % (ref 36.0–46.0)
Hemoglobin: 13.6 g/dL (ref 12.0–15.0)
MCH: 29.3 pg (ref 26.0–34.0)
MCHC: 31.3 g/dL (ref 30.0–36.0)
MCV: 93.5 fL (ref 78.0–100.0)
PLATELETS: 191 10*3/uL (ref 150–400)
RBC: 4.64 MIL/uL (ref 3.87–5.11)
RDW: 16.3 % — AB (ref 11.5–15.5)
WBC: 11 10*3/uL — AB (ref 4.0–10.5)

## 2017-09-18 LAB — MAGNESIUM: Magnesium: 2.5 mg/dL — ABNORMAL HIGH (ref 1.7–2.4)

## 2017-09-18 MED ORDER — INSULIN ASPART 100 UNIT/ML ~~LOC~~ SOLN
0.0000 [IU] | Freq: Three times a day (TID) | SUBCUTANEOUS | Status: DC
  Administered 2017-09-18: 15 [IU] via SUBCUTANEOUS
  Administered 2017-09-18: 11 [IU] via SUBCUTANEOUS

## 2017-09-18 MED ORDER — INSULIN ASPART 100 UNIT/ML ~~LOC~~ SOLN
10.0000 [IU] | Freq: Once | SUBCUTANEOUS | Status: AC
  Administered 2017-09-18: 10 [IU] via SUBCUTANEOUS

## 2017-09-18 MED ORDER — INSULIN ASPART 100 UNIT/ML ~~LOC~~ SOLN: 0.0000 [IU] | Freq: Every day | SUBCUTANEOUS | Status: DC

## 2017-09-18 MED ORDER — INSULIN GLARGINE 100 UNIT/ML ~~LOC~~ SOLN
15.0000 [IU] | Freq: Every day | SUBCUTANEOUS | Status: DC
  Filled 2017-09-18: qty 0.15

## 2017-09-18 MED ORDER — FUROSEMIDE 80 MG PO TABS: 80.0000 mg | ORAL_TABLET | Freq: Two times a day (BID) | ORAL | Status: DC

## 2017-09-18 NOTE — Discharge Summary (Signed)
Name: Crystal Yates MRN: [id number] DOB: [birth year] 62 y.o. PCP: [facility], [facility]  Date of Admission: 09/16/2017  2:17 PM Date of Discharge: 09/18/2017 Attending Physician: Aldine Contes, MD  Discharge Diagnosis: 1. Acute on chronic HF exacerbation  2. Secondary adrenal insufficiency   Discharge Medications: Allergies as of 09/18/2017      Reactions   Hydroxychloroquine Shortness Of Breath, Nausea Only, Other (See Comments)   Dizziness (also)   Donepezil Other (See Comments)   Dizziness, depression, and makes the patient feel "funny"   Prednisone Other (See Comments)   Causes depression and suicidal thoughts   Bupropion Other (See Comments)   Suicidal thoughts   Metrizamide Other (See Comments)   (a non-ionic radiopaque contrast agent) "Blows the vein" and contrast gathers at the injected site's limb   Varenicline Other (See Comments)   Suicidal thoughts   Tape Rash   Paper tape is preferred, PLEASE      Medication List    STOP taking these medications   ketoconazole 2 % cream Commonly known as:  NIZORAL     TAKE these medications   aspirin EC 81 MG tablet Take 81 mg by mouth once.   atorvastatin 20 MG tablet Commonly known as:  LIPITOR Take 20 mg by mouth daily.   carvedilol 3.125 MG tablet Commonly known as:  COREG Take 3.125 mg by mouth 2 (two) times daily with a meal.   clopidogrel 75 MG tablet Commonly known as:  PLAVIX Take 75 mg by mouth daily.   DULoxetine 60 MG capsule Commonly known as:  CYMBALTA take one 60mg  capsule   EMERGEN-C IMMUNE PO Take 1 capsule by mouth daily. CHEW   EMGALITY 120 MG/ML Soaj Generic drug:  Galcanezumab-gnlm Inject 120 mg into the skin every 30 (thirty) days.   furosemide 80 MG tablet Commonly known as:  LASIX Take 80 mg (2 tablets) twice daily.   FUSION PLUS Caps Take 1 capsule by mouth daily.   hydrocortisone 5 MG tablet Commonly known as:  CORTEF Take 5-10 mg by mouth See admin instructions. 10 mg  in the morning then 5 mg midday   isosorbide mononitrate 60 MG 24 hr tablet Commonly known as:  IMDUR Take 1 tablet (60 mg total) by mouth daily.   nitroGLYCERIN 0.4 MG SL tablet Commonly known as:  NITROSTAT Place 0.4 mg under the tongue every 5 (five) minutes as needed for chest pain.   potassium chloride SA 20 MEQ tablet Commonly known as:  K-DUR,KLOR-CON Take 1 tablet (20 mEq total) by mouth daily.   sacubitril-valsartan 24-26 MG Commonly known as:  ENTRESTO Take 1 tablet by mouth 2 (two) times daily.   SYNTHROID 125 MCG tablet Generic drug:  levothyroxine Take 125 mcg by mouth daily.   TRELEGY ELLIPTA 100-62.5-25 MCG/INH Aepb Generic drug:  Fluticasone-Umeclidin-Vilant Inhale 1 puff into the lungs daily.   TRESIBA FLEXTOUCH 100 UNIT/ML Sopn FlexTouch Pen Generic drug:  insulin degludec Inject 33 Units into the skin at bedtime.   TRULICITY 1.5 EH/6.3JS Sopn Generic drug:  Dulaglutide Inject 1.5 mg into the skin every 7 (seven) days.   Vitamin D3 2000 units capsule Take 2,000 Units by mouth daily.       Disposition and follow-up:   [name] was discharged from [facility] in Stable condition.  At the hospital follow up visit please address:  1.  Please assess for ongoing respiratory symptoms and volume status. Please ensure compliance with PO Lasix and continue to counsel patient  on low sodium diet. Please ensure patient know she has a follow up appt with cardiology on 7/10.    2.  Labs / imaging needed at time of follow-up: None   3.  Pending labs/ test needing follow-up: None   Follow-up Appointments: Follow-up Information    Go to [facility], [place].   Specialty:  Physician Assistant Why:  Please call your regular doctor and schedule a hospital follow up appointment within the next 1-2 weeks.  Contact information: [address]  [address] [place] [address] [phone]        Go to Dr. Agustin Cree.   Why:  you have an  appointment wiht your cardiologist on 7/10 at 2:40pm       [facility], [place]. Go on 09/26/2017.   Specialty:  Physician Assistant Why:  @9 :00am Contact information: [address] [place] [address] [phone]           Hospital Course by problem list:  1. Acute on chronic HF exacerbation: Patient presented with worsening lower extremity edema, JVD, and increasing oxygen requirements after dietary indiscretion. Patient has just been seen by cardiology with increase on home Lasix to 80 mg BID. However, her volume status did not improve with this. EKG without signs of ischemia and troponin negative. She was diurese with IV Lasix 80 mg BID for 2 days with improvement in respiratory and volume status. She was discharged home on Lasix 80 mg BID and advised to follow up with cardiology. She has an appointment with them on 7/10.   2. Secondary adrenal insufficiency: Patient home  hydrocortisone dose was doubled during this admission in the setting of acute illness. Her home dose was resumed on day of discharge.    Discharge Vitals:   BP 129/70 (BP Location: Left Arm)   Pulse 94   Temp 97.7 F (36.5 C) (Oral)   Resp 20   Ht 5\' 2"  (1.575 m)   Wt 213 lb 1.6 oz (96.7 kg)   SpO2 96%   BMI 38.98 kg/m   Pertinent Labs, Studies, and Procedures:   CBC Latest Ref Rng & Units 09/18/2017 09/17/2017 09/16/2017  WBC 4.0 - 10.5 K/uL 11.0(H) 10.8(H) 10.3  Hemoglobin 12.0 - 15.0 g/dL 13.6 13.3 14.1  Hematocrit 36.0 - 46.0 % 43.4 42.2 43.1  Platelets 150 - 400 K/uL 191 169 185   BMP Latest Ref Rng & Units 09/18/2017 09/17/2017 09/17/2017  Glucose 65 - 99 mg/dL 247(H) 129(H) 520(HH)  BUN 6 - 20 mg/dL 29(H) - -  Creatinine 0.44 - 1.00 mg/dL 1.20(H) - -  BUN/Creat Ratio 12 - 28 - - -  Sodium 135 - 145 mmol/L 138 - -  Potassium 3.5 - 5.1 mmol/L 4.6 - -  Chloride 101 - 111 mmol/L 94(L) - -  CO2 22 - 32 mmol/L 33(H) - -  Calcium 8.9 - 10.3 mg/dL 8.6(L) - -   TSH 2.2 A1c 10.5 BNP  118.6 D-dimer 0.35  CXR 6/22: FINDINGS: The heart size and mediastinal contours are within normal limits. No pneumothorax is noted. Minimal bibasilar subsegmental atelectasis is noted. Minimal left pleural effusion may be present. The visualized skeletal structures are unremarkable.  IMPRESSION: Minimal bibasilar subsegmental atelectasis. Minimal left pleural effusion.   Discharge Instructions: Discharge Instructions    (HEART FAILURE PATIENTS) Call MD:  Anytime you have any of the following symptoms: 1) 3 pound weight gain in 24 hours or 5 pounds in 1 week 2) shortness of breath, with or without a  dry hacking cough 3) swelling in the hands, feet or stomach 4) if you have to sleep on extra pillows at night in order to breathe.   Complete by:  As directed    Diet - low sodium heart healthy   Complete by:  As directed    Discharge instructions   Complete by:  As directed    Ms. Crystal Yates,   You were admitted to the hospital due to a heart failure exacerbation. We treated you with Lasix through your IV. Please continue taking your usual dose of Lasix at home of 80 mg twice a day. Please make sure to follow up with both your regular doctor and your heart doctor. You have an appointment with your heart doctor on 7/10 at 2:40pm. In the meantime, continue taking all of your home medications as usual. If you notice that you shortness of breath is worsening or the fluids in your legs gets worse, called your heart doctor, he can adjust your Lasix dose. Please call us if you have any questions.   - Dr. Frederico Hamman   Increase activity slowly   Complete by:  As directed       Signed: Welford Roche, MD 09/19/2017, 1:22 PM   Pager: [phone]

## 2017-09-18 NOTE — Progress Notes (Signed)
   Subjective:  Hypoglycemic overnight after receiving a total of 30 units of insulin within 1 hour (20 short acting and 10 long acting). Doing well this morning and eager to [facility] home. Off of supplemental oxygen. Ambulating in the hallway without difficulty.   Objective:  Vital signs in last 24 hours: Vitals:   09/17/17 1955 09/18/17 0009 09/18/17 0446 09/18/17 0906  BP: (!) 100/53 (!) 115/57 129/70   Pulse: 76 72 73 94  Resp: 18 18 18 20   Temp: 98 F (36.7 C) 98.1 F (36.7 C) 97.7 F (36.5 C)   TempSrc: Oral Oral Oral   SpO2: 94% 95% 95% 96%  Weight:   213 lb 1.6 oz (96.7 kg)   Height:       Physical Exam  Constitutional: She is oriented to person, place, and time.  Obese, chronically-ill appearing female sitting up in chair in no acute distress   Cardiovascular: Normal rate, regular rhythm and normal heart sounds. Exam reveals no gallop and no friction rub.  No murmur heard. Pulmonary/Chest:  Mild diffuse expiratory wheezes, no crackles, no increased WOB while on room air   Abdominal: Soft. Bowel sounds are normal. She exhibits no distension. There is no tenderness.  Musculoskeletal: She exhibits edema (LLE 1-2+ pitting edema and RLE 1+ pitting edema both improved from yesterday).  Neurological: She is alert and oriented to person, place, and time.    Assessment/Plan:  Principal Problem:   Acute on chronic systolic heart failure exacerbation(HCC) Active Problems:   Adrenal insufficiency (HCC)  # Acute on chronic HF exacerbation: HF 2/2 ICM with EF 40 % on recent TTE from 03/2017. UOP 1L and weight up 1 pound. Remains hypervolemic on exam though improved from yesterday, no JVD appreciated today and oxygenating well on room air. Will transition to PO Lasix and discharge home with close follow up with PCP and cardiologist.  - On telemetry  - IV Lasix 80 mg BID --> PO Lasix 80 mg BID  - Holding home Coreg, Entresto, and Imdur  - Daily weights + strict I/Os    # Secondary  Adrenal insufficiency: On hydrocortisone 10AM/5PM at home. This was doubled yesterday in the setting of acute illness. Will resume home dose today.  - Hydrocortisone 20 mg AM and 10 mg PM --> 10 AM and 5 PM   # COPD: Uses 2L of supplemental oxygen at night at baseline. Continues to have wheezing on exam, but oxygenating well on room air and ambulating without difficulty.  - Duonebs q6h PRN  - Continue home Breo Ellipta  - 2L O2 [place] QHS   # Non-obstructiveCADs/p PCI to LAD and RCA in 03/2016: Continue home aspirin 81mg  qd and plavix 75mg  qd.  # Hypothyroidism: TSH normal. Continue home Synthroid 125 mcg QD.   # T2DM, uncontrolled: On Trulicity and [place] at home.  A1c 10.5. Hyperglycemic yesterday likely due to increase in hydrocortisone. Hypoglycemic overnight after receiving a total of 30 units of insulin within 1 hour (20 short acting and 10 long acting). Will continue to monitor CBGs.  - Lantus 10-->15 units QHS  - SSI-S + CBG monitoring   Dispo: Anticipated discharge today of she remains stable throughout the day.  Welford Roche, MD 09/18/2017, 11:26 AM Pager: [phone]

## 2017-09-18 NOTE — Progress Notes (Signed)
Occupational Therapy Treatment Patient Details Name: Crystal Yates MRN: [id number] DOB: Nov 25, 1955 Today's Date: 09/18/2017    History of present illness Birdella Sippel is a 62 y.o. female with CAD S/P PCI 1/18, HFrEF (40 %-1/19),CKD II, COPD( Home O2 2L),DM, HTN, secondary adrenal insufficiency, thyroid disease and a current active smoker (43 pack year) who presented with one week history of worsening dyspnea. Found to have acute respiratory distress   OT comments  Pt progressing towards established OT goals. Pt performing grooming at sink with supervision. Pt continue to demonstrate decreased activity tolerance and fatigues during hallway distance functional mobility requiring standing rest breaks; SpO2 ranging from 94-97% on RA. Providing education on use of AE for LB dressing to increase independence and decrease fatigue. Pt demonstrating and verbalizing understanding. Continue to recommend dc home with HHOT and will continue to follow as admitted.    Follow Up Recommendations  Home health OT;Supervision/Assistance - 24 hour    Equipment Recommendations  None recommended by OT    Recommendations for Other Services      Precautions / Restrictions Precautions Precautions: Fall Restrictions Weight Bearing Restrictions: No       Mobility Bed Mobility Overal bed mobility: Modified Independent             General bed mobility comments: HOB up and use of rail  Transfers Overall transfer level: Needs assistance Equipment used: None Transfers: Sit to/from Stand Sit to Stand: Supervision         General transfer comment: Supervision for safety    Balance Overall balance assessment: Needs assistance Sitting-balance support: No upper extremity supported;Feet supported Sitting balance-Leahy Scale: Good     Standing balance support: No upper extremity supported;During functional activity Standing balance-Leahy Scale: Fair Standing balance comment: standing at sink to  brush teeth                           ADL either performed or assessed with clinical judgement   ADL Overall ADL's : Needs assistance/impaired     Grooming: Standing;Supervision/safety;Oral care               Lower Body Dressing: Min guard;Sit to/from stand;With adaptive equipment Lower Body Dressing Details (indicate cue type and reason): Educating pt on AE for LB dressing. Pt donning/doffing socks with AE demonstrating understanding. Educating pt on using reacher for donning pants; pt verbalized understanding and motivated to attempt at home.              Functional mobility during ADLs: Supervision/safety General ADL Comments: Demonstrating increased occuaptional performance, but contineus to present with decreased activity tolerance and fatigues. Requiring standing rest breaks during hallway distance mobility. Pt performing grooming at sink with supervision. Providing education on LB dressing with AE and pt verbalizing and demonstrating understanding     Vision       Perception     Praxis      Cognition Arousal/Alertness: Awake/alert Behavior During Therapy: WFL for tasks assessed/performed Overall Cognitive Status: Within Functional Limits for tasks assessed                                          Exercises     Shoulder Instructions       General Comments      Pertinent Vitals/ Pain       Pain Assessment: No/denies pain  Home  Living                                          Prior Functioning/Environment              Frequency  Min 2X/week        Progress Toward Goals  OT Goals(current goals can now be found in the care plan section)  Progress towards OT goals: Progressing toward goals  Acute Rehab OT Goals Patient Stated Goal: to go home OT Goal Formulation: With patient Time For Goal Achievement: 10/01/17 Potential to Achieve Goals: Good ADL Goals Pt Will Perform Grooming: with  supervision;standing Pt Will Perform Lower Body Dressing: with supervision;sit to/from stand Pt Will Transfer to Toilet: with supervision;ambulating;regular height toilet;grab bars Pt Will Perform Toileting - Clothing Manipulation and hygiene: with supervision;sit to/from stand  Plan Discharge plan remains appropriate    Co-evaluation                 AM-PAC PT "6 Clicks" Daily Activity     Outcome Measure   Help from another person eating meals?: None Help from another person taking care of personal grooming?: A Little Help from another person toileting, which includes using toliet, bedpan, or urinal?: A Little Help from another person bathing (including washing, rinsing, drying)?: A Little Help from another person to put on and taking off regular upper body clothing?: A Little Help from another person to put on and taking off regular lower body clothing?: A Little 6 Click Score: 19    End of Session Equipment Utilized During Treatment: Gait belt;Rolling walker  OT Visit Diagnosis: Unsteadiness on feet (R26.81);Muscle weakness (generalized) (M62.81)   Activity Tolerance Patient tolerated treatment well   Patient Left in chair;with call bell/phone within reach;with family/visitor present   Nurse Communication Mobility status        Time: 6759-1638 OT Time Calculation (min): 23 min  Charges: OT General Charges $OT Visit: 1 Visit OT Treatments $Self Care/Home Management : 23-37 mins  [place], OTR/L Acute Rehab Pager: [phone] Office: [place] 09/18/2017, 12:26 PM

## 2017-09-18 NOTE — Care Management Note (Signed)
Case Management Note  Patient Details  Name: Crystal Yates MRN: [id number] Date of Birth: [birth year]  Subjective/Objective:    CHF               Action/Plan: Patient lives at home with spouse; PCP is [facility], [place]; has private insurance with BCBS with prescription drug coverage; CM will continue to follow for progression of care. Expected Discharge Date:     Possibly 09/22/2017             Expected Discharge Plan:  Home/Self Care  Discharge planning Services  CM Consult  Status of Service:  In process, will continue to follow  Sherrilyn Rist [phone] 09/18/2017, 9:49 AM

## 2017-09-18 NOTE — Progress Notes (Addendum)
Inpatient Diabetes Program Recommendations  AACE/ADA: New Consensus Statement on Inpatient Glycemic Control (2015)  Target Ranges:  Prepandial:   less than 140 mg/dL      Peak postprandial:   less than 180 mg/dL (1-2 hours)      Critically ill patients:  140 - 180 mg/dL   Lab Results  Component Value Date   GLUCAP 282 (H) 09/18/2017   HGBA1C 10.5 (H) 09/16/2017    Review of Glycemic ControlResults for JAMISON, YUHASZ (MRN [id number]) as of 09/18/2017 10:49  Ref. Range 09/17/2017 21:54 09/17/2017 23:56 09/18/2017 00:23 09/18/2017 01:40 09/18/2017 04:56 09/18/2017 06:35 09/18/2017 07:26  Glucose-Capillary Latest Ref Range: 65 - 99 mg/dL 198 (H) 67 81 136 (H) 221 (H)  282 (H)    Diabetes history: Type 2 DM  Outpatient Diabetes medications: Trulicity 1.5 mg daily,  Tresiba 33 units q HS Current orders for Inpatient glycemic control:  Novolog resistant tid with meals and HS, Lantus 15 units q HS Cortef 20 mg with breakfast Inpatient Diabetes Program Recommendations:    Please consider increasing Lantus to 22 units q HS.  Also consider reducing Novolog correction to sensitive tid with meals and HS.  Also please add Novolog 4 units tid with meals (hold if patient eats less than 50%). Note elevated A1C. Will see patient today.   Thanks,  Adah Perl, RN, BC-ADM Inpatient Diabetes Coordinator Pager [phone] (8a-5p)   11:00- Attempted to talk to patient/husband.  Patient was on the phone with telephone company and was in and out of conversation with me.  She has [employer] and takes insulin at home.  Husband states that he and she both need more diet information as he feels she is not getting enough protein.  Blood sugars have been up and down here in the hospital.  She is not on her home dose of insulin.  May also need Novolog or rapid acting insulin at home as well to cover meals.

## 2017-09-19 DIAGNOSIS — Z6841 Body Mass Index (BMI) 40.0 and over, adult: Secondary | ICD-10-CM | POA: Diagnosis not present

## 2017-09-19 DIAGNOSIS — I110 Hypertensive heart disease with heart failure: Secondary | ICD-10-CM | POA: Diagnosis not present

## 2017-09-19 DIAGNOSIS — Z9189 Other specified personal risk factors, not elsewhere classified: Secondary | ICD-10-CM | POA: Diagnosis not present

## 2017-09-19 DIAGNOSIS — J449 Chronic obstructive pulmonary disease, unspecified: Secondary | ICD-10-CM | POA: Diagnosis not present

## 2017-09-21 DIAGNOSIS — Z6841 Body Mass Index (BMI) 40.0 and over, adult: Secondary | ICD-10-CM | POA: Diagnosis not present

## 2017-09-21 DIAGNOSIS — J441 Chronic obstructive pulmonary disease with (acute) exacerbation: Secondary | ICD-10-CM | POA: Diagnosis not present

## 2017-09-22 DIAGNOSIS — R0602 Shortness of breath: Secondary | ICD-10-CM | POA: Diagnosis not present

## 2017-09-22 DIAGNOSIS — R05 Cough: Secondary | ICD-10-CM | POA: Diagnosis not present

## 2017-09-24 DIAGNOSIS — J449 Chronic obstructive pulmonary disease, unspecified: Secondary | ICD-10-CM | POA: Diagnosis not present

## 2017-09-24 DIAGNOSIS — I110 Hypertensive heart disease with heart failure: Secondary | ICD-10-CM | POA: Diagnosis not present

## 2017-09-24 DIAGNOSIS — E1151 Type 2 diabetes mellitus with diabetic peripheral angiopathy without gangrene: Secondary | ICD-10-CM | POA: Diagnosis not present

## 2017-09-24 DIAGNOSIS — J441 Chronic obstructive pulmonary disease with (acute) exacerbation: Secondary | ICD-10-CM | POA: Diagnosis not present

## 2017-10-03 DIAGNOSIS — Z6841 Body Mass Index (BMI) 40.0 and over, adult: Secondary | ICD-10-CM | POA: Diagnosis not present

## 2017-10-03 DIAGNOSIS — J441 Chronic obstructive pulmonary disease with (acute) exacerbation: Secondary | ICD-10-CM | POA: Diagnosis not present

## 2017-10-03 DIAGNOSIS — Z139 Encounter for screening, unspecified: Secondary | ICD-10-CM | POA: Diagnosis not present

## 2017-10-04 ENCOUNTER — Ambulatory Visit (INDEPENDENT_AMBULATORY_CARE_PROVIDER_SITE_OTHER): Payer: Federal, State, Local not specified - PPO | Admitting: Cardiology

## 2017-10-04 ENCOUNTER — Encounter: Payer: Self-pay | Admitting: Cardiology

## 2017-10-04 VITALS — BP 126/70 | HR 96 | Ht 62.0 in | Wt 215.0 lb

## 2017-10-04 DIAGNOSIS — I2510 Atherosclerotic heart disease of native coronary artery without angina pectoris: Secondary | ICD-10-CM

## 2017-10-04 DIAGNOSIS — Z794 Long term (current) use of insulin: Secondary | ICD-10-CM

## 2017-10-04 DIAGNOSIS — S301XXA Contusion of abdominal wall, initial encounter: Secondary | ICD-10-CM | POA: Diagnosis not present

## 2017-10-04 DIAGNOSIS — I255 Ischemic cardiomyopathy: Secondary | ICD-10-CM | POA: Diagnosis not present

## 2017-10-04 DIAGNOSIS — E119 Type 2 diabetes mellitus without complications: Secondary | ICD-10-CM

## 2017-10-04 DIAGNOSIS — I5042 Chronic combined systolic (congestive) and diastolic (congestive) heart failure: Secondary | ICD-10-CM | POA: Diagnosis not present

## 2017-10-04 NOTE — Patient Instructions (Signed)
Medication Instructions:  Your physician recommends that you continue on your current medications as directed. Please refer to the Current Medication list given to you today.  Labwork: Your physician recommends that you have the following labs drawn: BMP today  Testing/Procedures: None  Follow-Up: Your physician recommends that you schedule a follow-up appointment in: 1 month  Any Other Special Instructions Will Be Listed Below (If Applicable).     If you need a refill on your cardiac medications before your next appointment, please call your pharmacy.   [place], RN, BSN

## 2017-10-04 NOTE — Progress Notes (Signed)
Cardiology Office Note:    Date:  10/04/2017   ID:  Crystal Yates, Crystal Yates [birth year], MRN [id number]  PCP:  Dortha Kern, [place]  Cardiologist:  Jenne Campus, MD    Referring MD: Dortha Kern, [place]   No chief complaint on file. I am not feeling well  History of Present Illness:    Crystal Yates is a 62 y.o. female with multiple medical problems she does have ischemic cardiomyopathy.  She comes today to my office for follow-up she is very frustrated and disappointed on 20 seconds of last month she ended up going to the emergency room spent few days in the hospital she was diuresed somewhat but cannot tell if she feels any different.  She is frustrated and determine trying to find out what the problem is.  She says she wants to have a cardiac catheterization.  I talked to her about potentially taking more diuretic and see if that helps also follow-up with pulmonary to see if he can improve her pulmonary condition she said that she wants to have a cardiac catheterization.  I told her that cardiac catheterization carries significant risk but she still insist on having it.  Therefore I will ask her to have Chem-7 done if Chem-7 is okay we will consider left and right cardiac catheterization.  We did cardiac catheterization year ago which showed nonobstructive disease.  Apparently mild to moderate in-stent stenosis of LAD.  Past Medical History:  Diagnosis Date  . CAD in native artery    a. Prior LAD stenting based on cath. b. RCA stenting 03/2016 x2.  . Chronic combined systolic and diastolic CHF (congestive heart failure) ([place])   . CKD (chronic kidney disease), stage II   . COPD (chronic obstructive pulmonary disease) ([place])   . Diabetes mellitus without complication ([place])   . Hashimoto's thyroiditis   . Hyperlipidemia   . Hypertension   . Secondary adrenal insufficiency ([place])   . Thyroid disease   . Tobacco abuse     Past Surgical History:  Procedure Laterality Date    . ABDOMINAL SURGERY    . CESAREAN SECTION    . CHOLECYSTECTOMY    . COLON RESECTION    . HERNIA MESH REMOVAL    . HERNIA REPAIR    . LEFT HEART CATH AND CORONARY ANGIOGRAPHY N/A 10/13/2016   Procedure: Left Heart Cath and Coronary Angiography;  Surgeon: Nelva Bush, MD;  Location: [facility];  Service: Cardiovascular;  Laterality: N/A;  . SHOULDER ARTHROSCOPY    . TUBAL LIGATION      Current Medications: Current Meds  Medication Sig  . aspirin EC 81 MG tablet Take 81 mg by mouth once.   Marland Kitchen atorvastatin (LIPITOR) 20 MG tablet Take 20 mg by mouth daily.  . carvedilol (COREG) 3.125 MG tablet Take 3.125 mg by mouth 2 (two) times daily with a meal.  . Cholecalciferol (VITAMIN D3) 2000 units capsule Take 2,000 Units by mouth daily.   . clopidogrel (PLAVIX) 75 MG tablet Take 75 mg by mouth daily.  . DULoxetine (CYMBALTA) 60 MG capsule take one 60mg  capsule  . EMGALITY 120 MG/ML SOAJ Inject 120 mg into the skin every 30 (thirty) days.  . furosemide (LASIX) 80 MG tablet Take 80 mg (2 tablets) twice daily.  . hydrocortisone (CORTEF) 5 MG tablet Take 5-10 mg by mouth See admin instructions. 10 mg in the morning then 5 mg midday  . insulin degludec (TRESIBA FLEXTOUCH) 100 UNIT/ML SOPN FlexTouch Pen Inject 33  Units into the skin at bedtime.   . Iron-FA-B Cmp-C-Biot-Probiotic (FUSION PLUS) CAPS Take 1 capsule by mouth daily.  . isosorbide mononitrate (IMDUR) 60 MG 24 hr tablet Take 1 tablet (60 mg total) by mouth daily.  Marland Kitchen levothyroxine (SYNTHROID) 125 MCG tablet Take 125 mcg by mouth daily.   . Multiple Vitamins-Minerals (EMERGEN-C IMMUNE PO) Take 1 capsule by mouth daily. CHEW  . nitroGLYCERIN (NITROSTAT) 0.4 MG SL tablet Place 0.4 mg under the tongue every 5 (five) minutes as needed for chest pain.   . potassium chloride SA (K-DUR,KLOR-CON) 20 MEQ tablet Take 1 tablet (20 mEq total) by mouth daily.  . sacubitril-valsartan (ENTRESTO) 24-26 MG Take 1 tablet by mouth 2 (two) times  daily.  . TRELEGY ELLIPTA 100-62.5-25 MCG/INH AEPB Inhale 1 puff into the lungs daily.  . TRULICITY 1.5 BJ/6.2GB SOPN Inject 1.5 mg into the skin every 7 (seven) days.     Allergies:   Hydroxychloroquine; Donepezil; Prednisone; Bupropion; Metrizamide; Varenicline; and Tape   Social History   Socioeconomic History  . Marital status: Married    Spouse name: Not on file  . Number of children: Not on file  . Years of education: Not on file  . Highest education level: Not on file  Occupational History  . Not on file  Social Needs  . Financial resource strain: Not on file  . Food insecurity:    Worry: Not on file    Inability: Not on file  . Transportation needs:    Medical: Not on file    Non-medical: Not on file  Tobacco Use  . Smoking status: Current Every Day Smoker    Packs/day: 0.75    Types: Cigarettes  . Smokeless tobacco: Never Used  Substance and Sexual Activity  . Alcohol use: Yes    Comment: 1 drink every day  . Drug use: Yes    Types: Marijuana    Comment: occ  . Sexual activity: Not on file  Lifestyle  . Physical activity:    Days per week: Not on file    Minutes per session: Not on file  . Stress: Not on file  Relationships  . Social connections:    Talks on phone: Not on file    Gets together: Not on file    Attends religious service: Not on file    Active member of club or organization: Not on file    Attends meetings of clubs or organizations: Not on file    Relationship status: Not on file  Other Topics Concern  . Not on file  Social History Narrative  . Not on file     Family History: The patient's family history includes Diabetes in her father, sister, sister, and son; Stroke in her mother. ROS:   Please see the history of present illness.    All 14 point review of systems negative except as described per history of present illness  EKGs/Labs/Other Studies Reviewed:      Recent Labs: 07/07/2017: NT-Pro BNP 789 09/16/2017: B Natriuretic  Peptide 118.6; TSH 2.218 09/17/2017: ALT 22 09/18/2017: BUN 29; Creatinine, Ser 1.20; Hemoglobin 13.6; Magnesium 2.5; Platelets 191; Potassium 4.6; Sodium 138  Recent Lipid Panel    Component Value Date/Time   CHOL 122 10/13/2016 0037   TRIG 68 10/13/2016 0037   HDL 34 (L) 10/13/2016 0037   CHOLHDL 3.6 10/13/2016 0037   VLDL 14 10/13/2016 0037   LDLCALC 74 10/13/2016 0037    Physical Exam:    VS:  BP  126/70 (BP Location: Right Arm, Patient Position: Sitting, Cuff Size: Normal)   Pulse 96   Ht 5\' 2"  (1.575 m)   Wt 215 lb (97.5 kg)   SpO2 96%   BMI 39.32 kg/m     Wt Readings from Last 3 Encounters:  10/04/17 215 lb (97.5 kg)  09/18/17 213 lb 1.6 oz (96.7 kg)  09/13/17 216 lb 1.9 oz (98 kg)     GEN:  Well nourished, well developed in no acute distress HEENT: Normal NECK: No JVD; No carotid bruits LYMPHATICS: No lymphadenopathy CARDIAC: RRR, no murmurs, no rubs, no gallops RESPIRATORY:  Clear to auscultation without rales, wheezing or rhonchi  ABDOMEN: Soft, non-tender, non-distended MUSCULOSKELETAL:  No edema; No deformity  SKIN: Warm and dry LOWER EXTREMITIES: no swelling NEUROLOGIC:  Alert and oriented x 3 PSYCHIATRIC:  Normal affect   ASSESSMENT:    1. Ischemic cardiomyopathy   2. Chronic combined systolic and diastolic CHF (congestive heart failure) ([place])   3. Coronary artery disease involving native coronary artery of native heart without angina pectoris   4. Type 2 diabetes mellitus without complication, with long-term current use of insulin (HCC)    PLAN:    In order of problems listed above:  1. Ischemic cardia myopathy and appropriate medications he can tolerate.  I offer her more diuretic after Chem-7 being checked she refused she wants to have a cardiac catheterization 2. Coronary artery disease.  Again we talked about potentially doing cardiac catheterization that she is pushing for. 3. Type 2 diabetes apparently stable.  Overall quite complicated  situation.  She is very frustrated and she would like to have a cardiac catheterization again will check Chem-7 to assess the risk of this procedure with potentially proceed with right and left cardiac catheterization I explained again to her risk and benefits of this procedure but she has since been doing this.  We also talked about potentially doing stress test she does not want to have it   Medication Adjustments/Labs and Tests Ordered: Current medicines are reviewed at length with the patient today.  Concerns regarding medicines are outlined above.  No orders of the defined types were placed in this encounter.  Medication changes: No orders of the defined types were placed in this encounter.   Signed, Park Liter, MD, [facility] 10/04/2017 3:15 PM    [place]

## 2017-10-04 NOTE — H&P (View-Only) (Signed)
Cardiology Office Note:    Date:  10/04/2017   ID:  Crystal, Yates 26-Apr-1955, MRN [id number]  PCP:  Dortha Kern, [place]  Cardiologist:  Jenne Campus, MD    Referring MD: Dortha Kern, [place]   No chief complaint on file. I am not feeling well  History of Present Illness:    Crystal Yates is a 62 y.o. female with multiple medical problems she does have ischemic cardiomyopathy.  She comes today to my office for follow-up she is very frustrated and disappointed on 20 seconds of last month she ended up going to the emergency room spent few days in the hospital she was diuresed somewhat but cannot tell if she feels any different.  She is frustrated and determine trying to find out what the problem is.  She says she wants to have a cardiac catheterization.  I talked to her about potentially taking more diuretic and see if that helps also follow-up with pulmonary to see if he can improve her pulmonary condition she said that she wants to have a cardiac catheterization.  I told her that cardiac catheterization carries significant risk but she still insist on having it.  Therefore I will ask her to have Chem-7 done if Chem-7 is okay we will consider left and right cardiac catheterization.  We did cardiac catheterization year ago which showed nonobstructive disease.  Apparently mild to moderate in-stent stenosis of LAD.  Past Medical History:  Diagnosis Date  . CAD in native artery    a. Prior LAD stenting based on cath. b. RCA stenting 03/2016 x2.  . Chronic combined systolic and diastolic CHF (congestive heart failure) ([place])   . CKD (chronic kidney disease), stage II   . COPD (chronic obstructive pulmonary disease) ([place])   . Diabetes mellitus without complication ([place])   . Hashimoto's thyroiditis   . Hyperlipidemia   . Hypertension   . Secondary adrenal insufficiency ([place])   . Thyroid disease   . Tobacco abuse     Past Surgical History:  Procedure Laterality Date    . ABDOMINAL SURGERY    . CESAREAN SECTION    . CHOLECYSTECTOMY    . COLON RESECTION    . HERNIA MESH REMOVAL    . HERNIA REPAIR    . LEFT HEART CATH AND CORONARY ANGIOGRAPHY N/A 10/13/2016   Procedure: Left Heart Cath and Coronary Angiography;  Surgeon: Nelva Bush, MD;  Location: [facility];  Service: Cardiovascular;  Laterality: N/A;  . SHOULDER ARTHROSCOPY    . TUBAL LIGATION      Current Medications: Current Meds  Medication Sig  . aspirin EC 81 MG tablet Take 81 mg by mouth once.   Marland Kitchen atorvastatin (LIPITOR) 20 MG tablet Take 20 mg by mouth daily.  . carvedilol (COREG) 3.125 MG tablet Take 3.125 mg by mouth 2 (two) times daily with a meal.  . Cholecalciferol (VITAMIN D3) 2000 units capsule Take 2,000 Units by mouth daily.   . clopidogrel (PLAVIX) 75 MG tablet Take 75 mg by mouth daily.  . DULoxetine (CYMBALTA) 60 MG capsule take one 60mg  capsule  . EMGALITY 120 MG/ML SOAJ Inject 120 mg into the skin every 30 (thirty) days.  . furosemide (LASIX) 80 MG tablet Take 80 mg (2 tablets) twice daily.  . hydrocortisone (CORTEF) 5 MG tablet Take 5-10 mg by mouth See admin instructions. 10 mg in the morning then 5 mg midday  . insulin degludec (TRESIBA FLEXTOUCH) 100 UNIT/ML SOPN FlexTouch Pen Inject 33  Units into the skin at bedtime.   . Iron-FA-B Cmp-C-Biot-Probiotic (FUSION PLUS) CAPS Take 1 capsule by mouth daily.  . isosorbide mononitrate (IMDUR) 60 MG 24 hr tablet Take 1 tablet (60 mg total) by mouth daily.  Marland Kitchen levothyroxine (SYNTHROID) 125 MCG tablet Take 125 mcg by mouth daily.   . Multiple Vitamins-Minerals (EMERGEN-C IMMUNE PO) Take 1 capsule by mouth daily. CHEW  . nitroGLYCERIN (NITROSTAT) 0.4 MG SL tablet Place 0.4 mg under the tongue every 5 (five) minutes as needed for chest pain.   . potassium chloride SA (K-DUR,KLOR-CON) 20 MEQ tablet Take 1 tablet (20 mEq total) by mouth daily.  . sacubitril-valsartan (ENTRESTO) 24-26 MG Take 1 tablet by mouth 2 (two) times  daily.  . TRELEGY ELLIPTA 100-62.5-25 MCG/INH AEPB Inhale 1 puff into the lungs daily.  . TRULICITY 1.5 JJ/0.0XF SOPN Inject 1.5 mg into the skin every 7 (seven) days.     Allergies:   Hydroxychloroquine; Donepezil; Prednisone; Bupropion; Metrizamide; Varenicline; and Tape   Social History   Socioeconomic History  . Marital status: Married    Spouse name: Not on file  . Number of children: Not on file  . Years of education: Not on file  . Highest education level: Not on file  Occupational History  . Not on file  Social Needs  . Financial resource strain: Not on file  . Food insecurity:    Worry: Not on file    Inability: Not on file  . Transportation needs:    Medical: Not on file    Non-medical: Not on file  Tobacco Use  . Smoking status: Current Every Day Smoker    Packs/day: 0.75    Types: Cigarettes  . Smokeless tobacco: Never Used  Substance and Sexual Activity  . Alcohol use: Yes    Comment: 1 drink every day  . Drug use: Yes    Types: Marijuana    Comment: occ  . Sexual activity: Not on file  Lifestyle  . Physical activity:    Days per week: Not on file    Minutes per session: Not on file  . Stress: Not on file  Relationships  . Social connections:    Talks on phone: Not on file    Gets together: Not on file    Attends religious service: Not on file    Active member of club or organization: Not on file    Attends meetings of clubs or organizations: Not on file    Relationship status: Not on file  Other Topics Concern  . Not on file  Social History Narrative  . Not on file     Family History: The patient's family history includes Diabetes in her father, sister, sister, and son; Stroke in her mother. ROS:   Please see the history of present illness.    All 14 point review of systems negative except as described per history of present illness  EKGs/Labs/Other Studies Reviewed:      Recent Labs: 07/07/2017: NT-Pro BNP 789 09/16/2017: B Natriuretic  Peptide 118.6; TSH 2.218 09/17/2017: ALT 22 09/18/2017: BUN 29; Creatinine, Ser 1.20; Hemoglobin 13.6; Magnesium 2.5; Platelets 191; Potassium 4.6; Sodium 138  Recent Lipid Panel    Component Value Date/Time   CHOL 122 10/13/2016 0037   TRIG 68 10/13/2016 0037   HDL 34 (L) 10/13/2016 0037   CHOLHDL 3.6 10/13/2016 0037   VLDL 14 10/13/2016 0037   LDLCALC 74 10/13/2016 0037    Physical Exam:    VS:  BP  126/70 (BP Location: Right Arm, Patient Position: Sitting, Cuff Size: Normal)   Pulse 96   Ht 5\' 2"  (1.575 m)   Wt 215 lb (97.5 kg)   SpO2 96%   BMI 39.32 kg/m     Wt Readings from Last 3 Encounters:  10/04/17 215 lb (97.5 kg)  09/18/17 213 lb 1.6 oz (96.7 kg)  09/13/17 216 lb 1.9 oz (98 kg)     GEN:  Well nourished, well developed in no acute distress HEENT: Normal NECK: No JVD; No carotid bruits LYMPHATICS: No lymphadenopathy CARDIAC: RRR, no murmurs, no rubs, no gallops RESPIRATORY:  Clear to auscultation without rales, wheezing or rhonchi  ABDOMEN: Soft, non-tender, non-distended MUSCULOSKELETAL:  No edema; No deformity  SKIN: Warm and dry LOWER EXTREMITIES: no swelling NEUROLOGIC:  Alert and oriented x 3 PSYCHIATRIC:  Normal affect   ASSESSMENT:    1. Ischemic cardiomyopathy   2. Chronic combined systolic and diastolic CHF (congestive heart failure) ([place])   3. Coronary artery disease involving native coronary artery of native heart without angina pectoris   4. Type 2 diabetes mellitus without complication, with long-term current use of insulin (HCC)    PLAN:    In order of problems listed above:  1. Ischemic cardia myopathy and appropriate medications he can tolerate.  I offer her more diuretic after Chem-7 being checked she refused she wants to have a cardiac catheterization 2. Coronary artery disease.  Again we talked about potentially doing cardiac catheterization that she is pushing for. 3. Type 2 diabetes apparently stable.  Overall quite complicated  situation.  She is very frustrated and she would like to have a cardiac catheterization again will check Chem-7 to assess the risk of this procedure with potentially proceed with right and left cardiac catheterization I explained again to her risk and benefits of this procedure but she has since been doing this.  We also talked about potentially doing stress test she does not want to have it   Medication Adjustments/Labs and Tests Ordered: Current medicines are reviewed at length with the patient today.  Concerns regarding medicines are outlined above.  No orders of the defined types were placed in this encounter.  Medication changes: No orders of the defined types were placed in this encounter.   Signed, Park Liter, MD, [facility] 10/04/2017 3:15 PM    [place]

## 2017-10-05 LAB — BASIC METABOLIC PANEL
BUN / CREAT RATIO: 16 (ref 12–28)
BUN: 18 mg/dL (ref 8–27)
CO2: 30 mmol/L — ABNORMAL HIGH (ref 20–29)
CREATININE: 1.14 mg/dL — AB (ref 0.57–1.00)
Calcium: 9.4 mg/dL (ref 8.7–10.3)
Chloride: 99 mmol/L (ref 96–106)
GFR, EST AFRICAN AMERICAN: 60 mL/min/{1.73_m2} (ref >59)
GFR, EST NON AFRICAN AMERICAN: 52 mL/min/{1.73_m2} — AB (ref >59)
Glucose: 253 mg/dL — ABNORMAL HIGH (ref 65–99)
Potassium: 3.9 mmol/L (ref 3.5–5.2)
Sodium: 144 mmol/L (ref 134–144)

## 2017-10-06 DIAGNOSIS — M25659 Stiffness of unspecified hip, not elsewhere classified: Secondary | ICD-10-CM | POA: Diagnosis not present

## 2017-10-06 DIAGNOSIS — R262 Difficulty in walking, not elsewhere classified: Secondary | ICD-10-CM | POA: Diagnosis not present

## 2017-10-06 DIAGNOSIS — M6281 Muscle weakness (generalized): Secondary | ICD-10-CM | POA: Diagnosis not present

## 2017-10-06 DIAGNOSIS — M545 Low back pain: Secondary | ICD-10-CM | POA: Diagnosis not present

## 2017-10-09 DIAGNOSIS — M545 Low back pain: Secondary | ICD-10-CM | POA: Diagnosis not present

## 2017-10-09 DIAGNOSIS — M6281 Muscle weakness (generalized): Secondary | ICD-10-CM | POA: Diagnosis not present

## 2017-10-09 DIAGNOSIS — R262 Difficulty in walking, not elsewhere classified: Secondary | ICD-10-CM | POA: Diagnosis not present

## 2017-10-09 DIAGNOSIS — M25659 Stiffness of unspecified hip, not elsewhere classified: Secondary | ICD-10-CM | POA: Diagnosis not present

## 2017-10-10 ENCOUNTER — Encounter: Payer: Self-pay | Admitting: Emergency Medicine

## 2017-10-10 ENCOUNTER — Telehealth: Payer: Self-pay | Admitting: *Deleted

## 2017-10-10 ENCOUNTER — Encounter: Payer: Self-pay | Admitting: *Deleted

## 2017-10-10 DIAGNOSIS — Z01818 Encounter for other preprocedural examination: Secondary | ICD-10-CM

## 2017-10-10 DIAGNOSIS — R0600 Dyspnea, unspecified: Secondary | ICD-10-CM

## 2017-10-10 DIAGNOSIS — R0609 Other forms of dyspnea: Principal | ICD-10-CM

## 2017-10-10 NOTE — Telephone Encounter (Signed)
Spoke with patient earlier this afternoon regarding dates and times that left and right heart catheterization can be scheduled.   Patient has been scheduled for heart catheterization on Friday, 10/13/17 at [facility] with Dr. Tamala Julian at 12:00 pm. Called to inform patient of this information and of lab work and chest xray that needs to be as soon as possible. Patient did not answer home phone or cell phone. Left message for patient to return call as soon as possible. Will continue efforts to contact patient to discuss.

## 2017-10-11 ENCOUNTER — Emergency Department (HOSPITAL_COMMUNITY): Payer: Federal, State, Local not specified - PPO

## 2017-10-11 ENCOUNTER — Encounter (HOSPITAL_COMMUNITY): Payer: Self-pay

## 2017-10-11 ENCOUNTER — Other Ambulatory Visit: Payer: Self-pay

## 2017-10-11 ENCOUNTER — Emergency Department (HOSPITAL_COMMUNITY)
Admission: EM | Admit: 2017-10-11 | Discharge: 2017-10-11 | Disposition: A | Discharge status: Home / Self Care | Payer: Federal, State, Local not specified - PPO | Attending: Emergency Medicine | Admitting: Emergency Medicine

## 2017-10-11 ENCOUNTER — Encounter: Payer: Self-pay | Admitting: Cardiology

## 2017-10-11 DIAGNOSIS — E119 Type 2 diabetes mellitus without complications: Secondary | ICD-10-CM | POA: Diagnosis not present

## 2017-10-11 DIAGNOSIS — Z79899 Other long term (current) drug therapy: Secondary | ICD-10-CM | POA: Diagnosis not present

## 2017-10-11 DIAGNOSIS — I130 Hypertensive heart and chronic kidney disease with heart failure and stage 1 through stage 4 chronic kidney disease, or unspecified chronic kidney disease: Secondary | ICD-10-CM | POA: Diagnosis not present

## 2017-10-11 DIAGNOSIS — E039 Hypothyroidism, unspecified: Secondary | ICD-10-CM | POA: Insufficient documentation

## 2017-10-11 DIAGNOSIS — R0600 Dyspnea, unspecified: Secondary | ICD-10-CM | POA: Diagnosis not present

## 2017-10-11 DIAGNOSIS — N182 Chronic kidney disease, stage 2 (mild): Secondary | ICD-10-CM | POA: Insufficient documentation

## 2017-10-11 DIAGNOSIS — F17210 Nicotine dependence, cigarettes, uncomplicated: Secondary | ICD-10-CM | POA: Insufficient documentation

## 2017-10-11 DIAGNOSIS — M7989 Other specified soft tissue disorders: Secondary | ICD-10-CM | POA: Diagnosis not present

## 2017-10-11 DIAGNOSIS — R0602 Shortness of breath: Secondary | ICD-10-CM | POA: Diagnosis not present

## 2017-10-11 DIAGNOSIS — I5042 Chronic combined systolic (congestive) and diastolic (congestive) heart failure: Secondary | ICD-10-CM | POA: Diagnosis not present

## 2017-10-11 DIAGNOSIS — G4733 Obstructive sleep apnea (adult) (pediatric): Secondary | ICD-10-CM | POA: Diagnosis not present

## 2017-10-11 DIAGNOSIS — R0789 Other chest pain: Secondary | ICD-10-CM | POA: Diagnosis not present

## 2017-10-11 DIAGNOSIS — J441 Chronic obstructive pulmonary disease with (acute) exacerbation: Secondary | ICD-10-CM

## 2017-10-11 DIAGNOSIS — Z01818 Encounter for other preprocedural examination: Secondary | ICD-10-CM | POA: Diagnosis not present

## 2017-10-11 DIAGNOSIS — Z794 Long term (current) use of insulin: Secondary | ICD-10-CM | POA: Insufficient documentation

## 2017-10-11 LAB — BASIC METABOLIC PANEL
Anion gap: 9 (ref 5–15)
BUN: 21 mg/dL (ref 8–23)
CHLORIDE: 104 mmol/L (ref 98–111)
CO2: 30 mmol/L (ref 22–32)
CREATININE: 1.12 mg/dL — AB (ref 0.44–1.00)
Calcium: 9.1 mg/dL (ref 8.9–10.3)
GFR calc non Af Amer: 52 mL/min — ABNORMAL LOW (ref >60)
GFR, EST AFRICAN AMERICAN: 60 mL/min — AB (ref >60)
Glucose, Bld: 156 mg/dL — ABNORMAL HIGH (ref 70–99)
Potassium: 3.6 mmol/L (ref 3.5–5.1)
Sodium: 143 mmol/L (ref 135–145)

## 2017-10-11 LAB — I-STAT TROPONIN, ED
Troponin i, poc: 0.01 ng/mL (ref 0.00–0.08)
Troponin i, poc: 0 ng/mL (ref 0.00–0.08)

## 2017-10-11 LAB — CBC
HCT: 39.2 % (ref 36.0–46.0)
Hemoglobin: 12.1 g/dL (ref 12.0–15.0)
MCH: 30.3 pg (ref 26.0–34.0)
MCHC: 30.9 g/dL (ref 30.0–36.0)
MCV: 98 fL (ref 78.0–100.0)
Platelets: 228 10*3/uL (ref 150–400)
RBC: 4 MIL/uL (ref 3.87–5.11)
RDW: 16.8 % — AB (ref 11.5–15.5)
WBC: 7.8 10*3/uL (ref 4.0–10.5)

## 2017-10-11 LAB — BRAIN NATRIURETIC PEPTIDE: B Natriuretic Peptide: 147.7 pg/mL — ABNORMAL HIGH (ref 0.0–100.0)

## 2017-10-11 MED ORDER — PREDNISONE 10 MG PO TABS: 40.0000 mg | ORAL_TABLET | Freq: Every day | ORAL | 0 refills | Status: DC

## 2017-10-11 MED ORDER — SODIUM CHLORIDE 0.9 % IV SOLN
INTRAVENOUS | Status: DC
  Administered 2017-10-11: 18:00:00 via INTRAVENOUS

## 2017-10-11 MED ORDER — IOPAMIDOL (ISOVUE-370) INJECTION 76%
100.0000 mL | Freq: Once | INTRAVENOUS | Status: AC | PRN
  Administered 2017-10-11: 100 mL via INTRAVENOUS

## 2017-10-11 MED ORDER — IOPAMIDOL (ISOVUE-370) INJECTION 76%
INTRAVENOUS | Status: AC
  Filled 2017-10-11: qty 100

## 2017-10-11 MED ORDER — IPRATROPIUM-ALBUTEROL 0.5-2.5 (3) MG/3ML IN SOLN
RESPIRATORY_TRACT | Status: AC
  Administered 2017-10-11: 3 mL
  Filled 2017-10-11: qty 3

## 2017-10-11 MED ORDER — IPRATROPIUM-ALBUTEROL 0.5-2.5 (3) MG/3ML IN SOLN
3.0000 mL | Freq: Once | RESPIRATORY_TRACT | Status: AC
  Administered 2017-10-11: 3 mL via RESPIRATORY_TRACT
  Filled 2017-10-11: qty 3

## 2017-10-11 NOTE — Discharge Instructions (Addendum)
Use your albuterol inhalers at home.  Start the short course of prednisone as prescribed.  Follow back up with your pulmonologist.  Return for any new or worse symptoms.  CT angios chest without evidence of pneumonia pulmonary edema or pulmonary embolus.

## 2017-10-11 NOTE — ED Notes (Addendum)
Reports dyspnea, fatigue and an 11 lb wgt gain over last 2 days; reports wears 2L o2 nightly; however, has had to wear O2 during day recently d/t increasing SOB; in NAD at this time; o2 sat  99% on 2L/Loa - pt falling asleep interm during exam

## 2017-10-11 NOTE — Telephone Encounter (Signed)
Patient returned call this morning. Informed her that heart catheterization has been scheduled for Friday, 10/13/17 at 12 pm. Patient advised to arrive at [facility] at 10 am on Friday and to have chest xray and lab work done as soon as possible today at [facility]. Patient verbalized understanding. No further questions. Mailed a copy of this information to patient. Faxed order forms to [facility].    Patient states she is having increased shortness of breath this morning so she cancelled one of her appointments for today. Advised patient if shortness of breath continued to worsen, she needed to go to the emergency department to be evaluated. Patient verbalized understanding. No further questions.

## 2017-10-11 NOTE — ED Notes (Signed)
Bag lunch given for blood glucose of 69 mg/dl - discussed with ED MD prior - ok to feed

## 2017-10-11 NOTE — ED Provider Notes (Signed)
[facility] EMERGENCY DEPARTMENT Provider Note   CSN: [id number] Arrival date & time: 10/11/17  1409     History   Chief Complaint Chief Complaint  Patient presents with  . Shortness of Breath    HPI Crystal Yates is a 62 y.o. female.  Patient sent in from pulmonologist office.  Patient with complaint is been shortness of breath some chest tightness weight gain of 11 pounds over the past 2 days.  Has a history of COPD and CHF.  I think that they were concerned that she was in CHF.  Patient does have oxygen at home uses it only at night here on 2 L she is satting in the upper 90s.  Patient has had some leg swelling bilaterally as well.     Past Medical History:  Diagnosis Date  . CAD in native artery    a. Prior LAD stenting based on cath. b. RCA stenting 03/2016 x2.  . Chronic combined systolic and diastolic CHF (congestive heart failure) ([place])   . CKD (chronic kidney disease), stage II   . COPD (chronic obstructive pulmonary disease) ([place])   . Diabetes mellitus without complication ([place])   . Hashimoto's thyroiditis   . Hyperlipidemia   . Hypertension   . Secondary adrenal insufficiency ([place])   . Thyroid disease   . Tobacco abuse     Patient Active Problem List   Diagnosis Date Noted  . Preop examination 10/10/2017  . Acute respiratory distress 09/16/2017  . Right upper lobe pneumonia ([place]) 05/06/2017  . Pneumonia due to respiratory syncytial virus (RSV) 04/21/2017  . Entrapment, radial nerve, right 03/31/2017  . COPD exacerbation ([place]) 10/14/2016  . Chronic combined systolic and diastolic CHF (congestive heart failure) ([place]) 10/13/2016  . Chest tightness or pressure 10/12/2016  . Dyspnea on exertion 09/29/2016  . History of coronary artery disease 09/29/2016  . OAB (overactive bladder) 09/21/2016  . Neck pain 09/08/2016  . Paresthesia of arm 09/08/2016  . Flank pain 08/31/2016  . Oxygen dependent 06/20/2016  . Neuralgia of right  upper extremity 04/26/2016  . Coronary artery disease 04/16/2016  . Lymphocele after surgical procedure 04/16/2016  . Ventricular tachycardia (paroxysmal) ([place]) 04/09/2016  . Acute on chronic systolic heart failure exacerbation(HCC) 04/08/2016  . Ischemic cardiomyopathy 04/08/2016  . Type 2 diabetes mellitus without complication, with long-term current use of insulin ([place]) 04/08/2016  . Anemia, blood loss 09/16/2015  . GI bleeding 09/16/2015  . Precordial pain 09/14/2015  . Chest pain 05/22/2015  . Hypokalemia 05/22/2015  . Hypothyroid 05/22/2015  . Status post hernia repair 01/29/2015  . Surgical wound breakdown 01/29/2015  . Hernia with strangulation 01/18/2015  . Hyperkalemia 01/18/2015  . Tobacco abuse 01/18/2015  . Recurrent incisional hernia with incarceration 01/15/2015  . Sleep apnea 01/15/2015  . S/P drug eluting coronary stent placement 10/16/2014  . Adrenal insufficiency ([place]) 11/21/2013  . Anemia 11/21/2013  . Unstable angina ([place]) 11/21/2013  . Atherosclerotic heart disease of native coronary artery without angina pectoris 11/21/2013  . Cardiomyopathy ([place]) 11/21/2013  . Congestive heart failure ([place]) 11/21/2013  . Diabetes mellitus with no complication ([place]) 97/98/9211  . Edema 11/21/2013  . Empty sella syndrome ([place]) 11/21/2013  . Hepatitis 11/21/2013  . Hyperlipemia 11/21/2013  . Hypertension 11/21/2013  . Kidney cyst, acquired 11/21/2013  . Lymph nodes enlarged 11/21/2013  . Fat necrosis 11/21/2013  . Other emphysema ([place]) 11/21/2013  . Renal disorder 11/21/2013  . Shortness of breath 11/21/2013  . Sjogrens syndrome ([place]) 11/21/2013  .  Thyroid disorder 11/21/2013  . Nicotine dependence, uncomplicated 51/70/0174  . Major depressive disorder, recurrent episode ([place]) 09/17/2013  . Displacement of lumbar intervertebral disc 06/14/2013  . Intractable migraine without aura 06/14/2013  . Mild cognitive impairment 06/14/2013  . Polyneuropathy in diabetes ([place])  06/14/2013  . Adrenal cortex insufficiency ([place]) 04/12/2011  . DDD (degenerative disc disease), lumbosacral 01/11/2008  . Thoracic or lumbosacral neuritis or radiculitis 01/11/2008    Past Surgical History:  Procedure Laterality Date  . ABDOMINAL SURGERY    . CESAREAN SECTION    . CHOLECYSTECTOMY    . COLON RESECTION    . HERNIA MESH REMOVAL    . HERNIA REPAIR    . LEFT HEART CATH AND CORONARY ANGIOGRAPHY N/A 10/13/2016   Procedure: Left Heart Cath and Coronary Angiography;  Surgeon: Nelva Bush, MD;  Location: [facility];  Service: Cardiovascular;  Laterality: N/A;  . SHOULDER ARTHROSCOPY    . TUBAL LIGATION       OB History   None      Home Medications    Prior to Admission medications   Medication Sig Start Date End Date Taking? Authorizing Provider  atorvastatin (LIPITOR) 20 MG tablet Take 20 mg by mouth daily. 12/12/14  Yes [provider]  carvedilol (COREG) 3.125 MG tablet Take 3.125 mg by mouth 2 (two) times daily with a meal.   Yes [provider]  Cholecalciferol (VITAMIN D3) 2000 units capsule Take 2,000 Units by mouth daily.    Yes [provider]  clopidogrel (PLAVIX) 75 MG tablet Take 75 mg by mouth daily.   Yes [provider]  doxycycline (VIBRAMYCIN) 100 MG capsule Take 100 mg by mouth 2 (two) times daily. FOR 7 DAYS 10/05/17 10/12/17 Yes [provider]  DULoxetine (CYMBALTA) 60 MG capsule Take 60 mg by mouth once a day 12/05/16  Yes [provider]  EMGALITY 120 MG/ML SOAJ Inject 120 mg into the skin every 30 (thirty) days. 07/30/17  Yes [provider]  fludrocortisone (FLORINEF) 0.1 MG tablet Take 0.1 mg by mouth 2 (two) times daily.   Yes [provider]  furosemide (LASIX) 40 MG tablet Take 40-80 mg by mouth daily.    Yes [provider]  hydrocortisone (CORTEF) 5 MG tablet Take 5-15 mg by mouth See admin instructions. Take 15 mg by mouth in the morning and 5 mg in the  afternoon   Yes [provider]  Insulin Aspart, w/Niacinamide, (FIASP FLEXTOUCH) 100 UNIT/ML SOPN Inject into the skin See admin instructions. Three times a day, per sliding scale   Yes [provider]  insulin degludec (TRESIBA FLEXTOUCH) 100 UNIT/ML SOPN FlexTouch Pen Inject 40 Units into the skin at bedtime.  08/28/16  Yes [provider]  Iron-FA-B Cmp-C-Biot-Probiotic (FUSION PLUS) CAPS Take 1 capsule by mouth daily. 10/09/16  Yes [provider]  isosorbide mononitrate (IMDUR) 60 MG 24 hr tablet Take 1 tablet (60 mg total) by mouth daily. 03/10/17  Yes Park Liter, MD  levothyroxine (SYNTHROID, LEVOTHROID) 150 MCG tablet Take 150 mcg by mouth daily before breakfast.   Yes [provider]  Magnesium 250 MG TABS Take 250 mg by mouth daily.   Yes [provider]  nitroGLYCERIN (NITROSTAT) 0.4 MG SL tablet Place 0.4 mg under the tongue every 5 (five) minutes as needed for chest pain.    Yes [provider]  oxybutynin (DITROPAN XL) 15 MG 24 hr tablet Take 15 mg by mouth at bedtime.  Yes [provider]  potassium chloride SA (K-DUR,KLOR-CON) 20 MEQ tablet Take 1 tablet (20 mEq total) by mouth daily. 03/14/17  Yes Park Liter, MD  pramipexole (MIRAPEX) 1 MG tablet Take 1 mg by mouth 2 (two) times daily.   Yes [provider]  sacubitril-valsartan (ENTRESTO) 24-26 MG Take 1 tablet by mouth 2 (two) times daily. 10/20/16  Yes Park Liter, MD  Semaglutide [facility]) 0.25 or 0.5 MG/DOSE SOPN Inject into the skin.   Yes [provider]  TRELEGY ELLIPTA 100-62.5-25 MCG/INH AEPB Inhale 1 puff into the lungs daily. 08/30/17  Yes [provider]  TRULICITY 1.5 SH/7.62YO SOPN Inject 1.5 mg into the skin every Sunday.  10/10/16  Yes [provider]  furosemide (LASIX) 80 MG tablet Take 80 mg (2 tablets) twice daily. 09/15/17   Park Liter, MD  Multiple Vitamins-Minerals (EMERGEN-C  IMMUNE PO) Take 1 capsule by mouth daily. CHEW    [provider]  predniSONE (DELTASONE) 10 MG tablet Take 4 tablets (40 mg total) by mouth daily. 10/11/17   Fredia Sorrow, MD    Family History Family History  Problem Relation Age of Onset  . Stroke Mother   . Diabetes Father   . Diabetes Sister   . Diabetes Sister   . Diabetes Son     Social History Social History   Tobacco Use  . Smoking status: Current Every Day Smoker    Packs/day: 0.75    Types: Cigarettes  . Smokeless tobacco: Never Used  Substance Use Topics  . Alcohol use: Yes    Comment: 1 drink every day  . Drug use: Yes    Types: Marijuana    Comment: occ     Allergies   Hydroxychloroquine; Donepezil; Prednisone; Bupropion; Metrizamide; Varenicline; and Tape   Review of Systems Review of Systems  Constitutional: Negative for fever.  HENT: Negative for congestion.   Eyes: Negative for redness.  Respiratory: Positive for chest tightness and shortness of breath.   Cardiovascular: Positive for leg swelling. Negative for chest pain.  Gastrointestinal: Negative for abdominal pain.  Genitourinary: Negative for dysuria.  Musculoskeletal: Negative for myalgias.  Skin: Negative for rash.  Neurological: Negative for syncope.  Hematological: Does not bruise/bleed easily.  Psychiatric/Behavioral: Negative for confusion.     Physical Exam Updated Vital Signs BP 107/60   Pulse 66   Temp 98.2 F (36.8 C) (Oral)   Resp 18   Ht 1.575 m (5\' 2" )   Wt 102.5 kg (226 lb)   SpO2 97%   BMI 41.34 kg/m   Physical Exam  Constitutional: She is oriented to person, place, and time. She appears well-developed and well-nourished. No distress.  HENT:  Head: Normocephalic and atraumatic.  Mouth/Throat: Oropharynx is clear and moist.  Eyes: Pupils are equal, round, and reactive to light. Conjunctivae and EOM are normal.  Neck: Normal range of motion. Neck supple.  Cardiovascular: Normal rate, regular rhythm  and normal heart sounds.  Pulmonary/Chest: Effort normal. No respiratory distress. She has wheezes.  Abdominal: Soft. Bowel sounds are normal. There is no tenderness.  Musculoskeletal: She exhibits edema.  Neurological: She is alert and oriented to person, place, and time. No cranial nerve deficit or sensory deficit. She exhibits normal muscle tone. Coordination normal.  Skin: Skin is warm.  Nursing note and vitals reviewed.    ED Treatments / Results  Labs (all labs ordered are listed, but only abnormal results are displayed) Labs Reviewed  BASIC METABOLIC PANEL - Abnormal;  Notable for the following components:      Result Value   Glucose, Bld 156 (*)    Creatinine, Ser 1.12 (*)    GFR calc non Af Amer 52 (*)    GFR calc Af Amer 60 (*)    All other components within normal limits  CBC - Abnormal; Notable for the following components:   RDW 16.8 (*)    All other components within normal limits  BRAIN NATRIURETIC PEPTIDE - Abnormal; Notable for the following components:   B Natriuretic Peptide 147.7 (*)    All other components within normal limits  I-STAT TROPONIN, ED  I-STAT TROPONIN, ED    EKG EKG Interpretation  Date/Time:  Wednesday October 11 2017 14:17:49 EDT Ventricular Rate:  75 PR Interval:  142 QRS Duration: 98 QT Interval:  402 QTC Calculation: 448 R Axis:   62 Text Interpretation:  Normal sinus rhythm with sinus arrhythmia Cannot rule out Anterior infarct , age undetermined Abnormal ECG No significant change since last tracing Confirmed by Fredia Sorrow [phone]) on 10/11/2017 3:47:26 PM   Radiology Dg Chest 2 View  Result Date: 10/11/2017 CLINICAL DATA:  Shortness of breath EXAM: CHEST - 2 VIEW COMPARISON:  10/11/2017 FINDINGS: Left basilar atelectasis. No focal airspace consolidation or pulmonary edema. Normal cardiomediastinal contours. Lower cervical ACDF. IMPRESSION: No active cardiopulmonary disease. Electronically Signed   By: Ulyses Jarred M.D.   On:  10/11/2017 15:34   Ct Angio Chest Pe W/cm &/or Wo Cm  Result Date: 10/11/2017 CLINICAL DATA:  Pt c/o SOB, dyspnea, fatigue and weight gain x 2 days No hx of blood clots per pt Current smoker, COPD, DM EXAM: CT ANGIOGRAPHY CHEST WITH CONTRAST TECHNIQUE: Multidetector CT imaging of the chest was performed using the standard protocol during bolus administration of intravenous contrast. Multiplanar CT image reconstructions and MIPs were obtained to evaluate the vascular anatomy. CONTRAST:  160mL ISOVUE-370 IOPAMIDOL (ISOVUE-370) INJECTION 76% COMPARISON:  07/04/2017 FINDINGS: Cardiovascular: Heart size upper limits normal. No pericardial effusion. Satisfactory opacification of pulmonary arteries noted, and there is no evidence of pulmonary emboli. Coronary calcifications. Adequate contrast opacification of the thoracic aorta with no evidence of dissection, aneurysm, or stenosis. There is classic 3-vessel brachiocephalic arch anatomy without proximal stenosis. Calcified plaque in the arch and descending thoracic segment. Mediastinum/Nodes: Asymmetric left lobe thyroid enlargement with substernal extension. No hilar or mediastinal adenopathy. Lungs/Pleura: No pleural effusion. No pneumothorax. Emphysematous changes in the lung apices. Upper Abdomen: Cholecystectomy clips.  No acute findings. Musculoskeletal: Cervical fixation hardware. Negative for fracture or worrisome bone lesion. Review of the MIP images confirms the above findings. IMPRESSION: 1. Negative for acute PE or thoracic aortic dissection. 2. Coronary and Aortic Atherosclerosis (ICD10-170.0). Electronically Signed   By: Lucrezia Europe M.D.   On: 10/11/2017 20:20    Procedures Procedures (including critical care time)  Medications Ordered in ED Medications  0.9 %  sodium chloride infusion ( Intravenous New Bag/Given 10/11/17 1825)  iopamidol (ISOVUE-370) 76 % injection (has no administration in time range)  ipratropium-albuterol (DUONEB) 0.5-2.5 (3)  MG/3ML nebulizer solution 3 mL (3 mLs Nebulization Given 10/11/17 1747)  ipratropium-albuterol (DUONEB) 0.5-2.5 (3) MG/3ML nebulizer solution (3 mLs  Given 10/11/17 1815)  iopamidol (ISOVUE-370) 76 % injection 100 mL (100 mLs Intravenous Contrast Given 10/11/17 1940)     Initial Impression / Assessment and Plan / ED Course  I have reviewed the triage vital signs and the nursing notes.  Pertinent labs & imaging results that were available during my care  of the patient were reviewed by me and considered in my medical decision making (see chart for details).     Patient's work-up included to normal troponins.  Chest x-ray without specific findings.  So CT angios was done which was negative for pulmonary embolus pulmonary edema or pneumonia.  Patient's BNP up a little bit BUN and creatinine is kind of baseline for her.  No significant changes there.  Patient given nebulizer treatment here patient states without any real improvement however wheezing did resolve.  Patient does have oxygen at home.  Usually only uses it night.  But she can use it during the daytime.  Recommended a short course of prednisone patient states she can handle a short course of prednisone but long courses to tend to make her feel little crazy.  Patient did not want any prednisone here but was willing to take the prescription to go home.  Recommend she follow back up with pulmonary.  Return for any new or worse symptoms.  Final Clinical Impressions(s) / ED Diagnoses   Final diagnoses:  COPD exacerbation [facility])    ED Discharge Orders        Ordered    predniSONE (DELTASONE) 10 MG tablet  Daily     10/11/17 2253       Fredia Sorrow, MD 10/11/17 2257

## 2017-10-11 NOTE — ED Provider Notes (Signed)
Patient placed in Quick Look pathway, seen and evaluated   Chief Complaint: SOB  HPI:   Increased exertional SOB and chest tightness x 2 days. Associated with weight gain of 11#, leg swelling. Increased oxygen demand, using 2 L NCF throughout the day (usually use at bed time only). H/o COPD, CHF compliant with lasix 80 mg BID. Per last cardiology note 7/10, she is requesting LHC schedule this Friday.   ROS: positive: cough Negative: fevers, abdominal distention   Physical Exam:   Gen: No distress  Neuro: Awake and Alert  Skin: Warm    Focused Exam: expiratory wheezing to mid/lower lobes, faint crackles noted upper chest. 2+ pitting edema to above knees. Adequate SpO2 on 2L .    Initiation of care has begun. The patient has been counseled on the process, plan, and necessity for staying for the completion/evaluation, and the remainder of the medical screening examination    Kinnie Feil, PA-C 10/11/17 [place], DO 10/11/17 1558

## 2017-10-11 NOTE — ED Notes (Signed)
ED MD notified of BS 69 mg/dl - states ok to feed pt

## 2017-10-11 NOTE — ED Triage Notes (Signed)
Pt endorses shob, chest tightness, weight gain of 11 lbs x 2 days. Hx of copd and chf, sent by pulmonologist. Pt states "i'm maxed out on diuretics and my creatinine is going up"

## 2017-10-11 NOTE — ED Notes (Signed)
BG 69 mg/dl as per pt's own monitor via implantable device to posterior aspect of RUE

## 2017-10-12 ENCOUNTER — Encounter (HOSPITAL_COMMUNITY): Payer: Self-pay | Admitting: Interventional Cardiology

## 2017-10-12 ENCOUNTER — Telehealth: Payer: Self-pay | Admitting: *Deleted

## 2017-10-12 NOTE — Telephone Encounter (Addendum)
Pt contacted pre-catheterization scheduled at [facility] for: Friday October 13, 2017 12 noon Verified arrival time and place: [place] Entrance A at: 10 AM  No solid food after midnight prior to cath, clear liquids until 5 AM day of procedure. Verified allergies in Epic.  Hold: Entresto day before and day of procedure. Furosemide day before and day of procedure. KCl day before and day of procedure. Insulin AM of procedure Tresiba pm prior to procedure  Except hold medications AM meds can be  taken pre-cath with sip of water including: ASA 81 mg Clopidogrel 75   Confirmed patient has responsible person to drive home post procedure and for 24 hours after you arrive home: yes  Pt in ED yesterday, was told shortness of breath probably related to pulmonary issues, pt states symptoms are unchanged from yesterday.

## 2017-10-13 ENCOUNTER — Encounter (HOSPITAL_COMMUNITY): Payer: Self-pay | Admitting: Interventional Cardiology

## 2017-10-13 ENCOUNTER — Ambulatory Visit (HOSPITAL_COMMUNITY)
Admission: RE | Admit: 2017-10-13 | Discharge: 2017-10-14 | Disposition: A | Discharge status: Home / Self Care | Payer: Federal, State, Local not specified - PPO | Source: Ambulatory Visit | Attending: Interventional Cardiology | Admitting: Interventional Cardiology

## 2017-10-13 ENCOUNTER — Ambulatory Visit (HOSPITAL_COMMUNITY)
Admission: RE | Disposition: A | Discharge status: Home / Self Care | Payer: Self-pay | Source: Ambulatory Visit | Attending: Interventional Cardiology

## 2017-10-13 ENCOUNTER — Other Ambulatory Visit: Payer: Self-pay

## 2017-10-13 DIAGNOSIS — Z955 Presence of coronary angioplasty implant and graft: Secondary | ICD-10-CM | POA: Insufficient documentation

## 2017-10-13 DIAGNOSIS — E1122 Type 2 diabetes mellitus with diabetic chronic kidney disease: Secondary | ICD-10-CM | POA: Insufficient documentation

## 2017-10-13 DIAGNOSIS — I5023 Acute on chronic systolic (congestive) heart failure: Secondary | ICD-10-CM

## 2017-10-13 DIAGNOSIS — Z7982 Long term (current) use of aspirin: Secondary | ICD-10-CM | POA: Insufficient documentation

## 2017-10-13 DIAGNOSIS — Z823 Family history of stroke: Secondary | ICD-10-CM | POA: Insufficient documentation

## 2017-10-13 DIAGNOSIS — I130 Hypertensive heart and chronic kidney disease with heart failure and stage 1 through stage 4 chronic kidney disease, or unspecified chronic kidney disease: Secondary | ICD-10-CM | POA: Insufficient documentation

## 2017-10-13 DIAGNOSIS — N182 Chronic kidney disease, stage 2 (mild): Secondary | ICD-10-CM | POA: Insufficient documentation

## 2017-10-13 DIAGNOSIS — F17210 Nicotine dependence, cigarettes, uncomplicated: Secondary | ICD-10-CM | POA: Diagnosis not present

## 2017-10-13 DIAGNOSIS — I5042 Chronic combined systolic (congestive) and diastolic (congestive) heart failure: Secondary | ICD-10-CM | POA: Insufficient documentation

## 2017-10-13 DIAGNOSIS — Z79899 Other long term (current) drug therapy: Secondary | ICD-10-CM | POA: Diagnosis not present

## 2017-10-13 DIAGNOSIS — I25118 Atherosclerotic heart disease of native coronary artery with other forms of angina pectoris: Secondary | ICD-10-CM

## 2017-10-13 DIAGNOSIS — Z7901 Long term (current) use of anticoagulants: Secondary | ICD-10-CM | POA: Diagnosis not present

## 2017-10-13 DIAGNOSIS — Z833 Family history of diabetes mellitus: Secondary | ICD-10-CM | POA: Insufficient documentation

## 2017-10-13 DIAGNOSIS — E785 Hyperlipidemia, unspecified: Secondary | ICD-10-CM | POA: Diagnosis not present

## 2017-10-13 DIAGNOSIS — Z9889 Other specified postprocedural states: Secondary | ICD-10-CM | POA: Diagnosis not present

## 2017-10-13 DIAGNOSIS — Z9861 Coronary angioplasty status: Secondary | ICD-10-CM

## 2017-10-13 DIAGNOSIS — I2510 Atherosclerotic heart disease of native coronary artery without angina pectoris: Secondary | ICD-10-CM | POA: Diagnosis present

## 2017-10-13 DIAGNOSIS — J449 Chronic obstructive pulmonary disease, unspecified: Secondary | ICD-10-CM | POA: Diagnosis not present

## 2017-10-13 DIAGNOSIS — Z888 Allergy status to other drugs, medicaments and biological substances status: Secondary | ICD-10-CM | POA: Insufficient documentation

## 2017-10-13 DIAGNOSIS — I272 Pulmonary hypertension, unspecified: Secondary | ICD-10-CM | POA: Insufficient documentation

## 2017-10-13 DIAGNOSIS — Z9049 Acquired absence of other specified parts of digestive tract: Secondary | ICD-10-CM | POA: Diagnosis not present

## 2017-10-13 DIAGNOSIS — Z794 Long term (current) use of insulin: Secondary | ICD-10-CM | POA: Insufficient documentation

## 2017-10-13 DIAGNOSIS — Z79890 Hormone replacement therapy (postmenopausal): Secondary | ICD-10-CM | POA: Diagnosis not present

## 2017-10-13 DIAGNOSIS — I255 Ischemic cardiomyopathy: Secondary | ICD-10-CM | POA: Diagnosis not present

## 2017-10-13 DIAGNOSIS — R079 Chest pain, unspecified: Secondary | ICD-10-CM | POA: Diagnosis present

## 2017-10-13 DIAGNOSIS — E119 Type 2 diabetes mellitus without complications: Secondary | ICD-10-CM

## 2017-10-13 DIAGNOSIS — E063 Autoimmune thyroiditis: Secondary | ICD-10-CM | POA: Diagnosis not present

## 2017-10-13 DIAGNOSIS — Z9862 Peripheral vascular angioplasty status: Secondary | ICD-10-CM

## 2017-10-13 DIAGNOSIS — Z8679 Personal history of other diseases of the circulatory system: Secondary | ICD-10-CM

## 2017-10-13 HISTORY — DX: Acute myocardial infarction, unspecified: I21.9

## 2017-10-13 HISTORY — PX: CORONARY BALLOON ANGIOPLASTY: CATH118233

## 2017-10-13 HISTORY — PX: RIGHT/LEFT HEART CATH AND CORONARY ANGIOGRAPHY: CATH118266

## 2017-10-13 HISTORY — DX: Acute on chronic systolic (congestive) heart failure: I50.23

## 2017-10-13 LAB — POCT I-STAT 3, VENOUS BLOOD GAS (G3P V)
Acid-Base Excess: 3 mmol/L — ABNORMAL HIGH (ref 0.0–2.0)
Bicarbonate: 30 mmol/L — ABNORMAL HIGH (ref 20.0–28.0)
O2 Saturation: 75 %
PCO2 VEN: 54.6 mmHg (ref 44.0–60.0)
PH VEN: 7.349 (ref 7.250–7.430)
TCO2: 32 mmol/L (ref 22–32)
pO2, Ven: 43 mmHg (ref 32.0–45.0)

## 2017-10-13 LAB — POCT I-STAT 3, ART BLOOD GAS (G3+)
ACID-BASE EXCESS: 3 mmol/L — AB (ref 0.0–2.0)
BICARBONATE: 29.5 mmol/L — AB (ref 20.0–28.0)
O2 SAT: 96 %
PO2 ART: 84 mmHg (ref 83.0–108.0)
TCO2: 31 mmol/L (ref 22–32)
pCO2 arterial: 52 mmHg — ABNORMAL HIGH (ref 32.0–48.0)
pH, Arterial: 7.362 (ref 7.350–7.450)

## 2017-10-13 LAB — GLUCOSE, CAPILLARY
Glucose-Capillary: 222 mg/dL — ABNORMAL HIGH (ref 70–99)
Glucose-Capillary: 156 mg/dL — ABNORMAL HIGH (ref 70–99)
Glucose-Capillary: 266 mg/dL — ABNORMAL HIGH (ref 70–99)
Glucose-Capillary: 202 mg/dL — ABNORMAL HIGH (ref 70–99)

## 2017-10-13 LAB — POCT ACTIVATED CLOTTING TIME: Activated Clotting Time: 345 seconds

## 2017-10-13 SURGERY — RIGHT/LEFT HEART CATH AND CORONARY ANGIOGRAPHY
Anesthesia: LOCAL

## 2017-10-13 MED ORDER — FENTANYL CITRATE (PF) 100 MCG/2ML IJ SOLN
INTRAMUSCULAR | Status: AC
  Filled 2017-10-13: qty 2

## 2017-10-13 MED ORDER — CARVEDILOL 3.125 MG PO TABS
3.1250 mg | ORAL_TABLET | Freq: Two times a day (BID) | ORAL | Status: DC
  Administered 2017-10-13 – 2017-10-14 (×2): 3.125 mg via ORAL
  Filled 2017-10-13 (×2): qty 1

## 2017-10-13 MED ORDER — FENTANYL CITRATE (PF) 100 MCG/2ML IJ SOLN
INTRAMUSCULAR | Status: DC | PRN
  Administered 2017-10-13 (×2): 50 ug via INTRAVENOUS
  Administered 2017-10-13: 25 ug via INTRAVENOUS
  Administered 2017-10-13: 50 ug via INTRAVENOUS
  Administered 2017-10-13: 25 ug via INTRAVENOUS

## 2017-10-13 MED ORDER — RISAQUAD PO CAPS
1.0000 | ORAL_CAPSULE | Freq: Every day | ORAL | Status: DC
  Administered 2017-10-13 – 2017-10-14 (×2): 1 via ORAL
  Filled 2017-10-13 (×2): qty 1

## 2017-10-13 MED ORDER — ANGIOPLASTY BOOK
Freq: Once | Status: AC
  Administered 2017-10-13
  Filled 2017-10-13: qty 1

## 2017-10-13 MED ORDER — ONDANSETRON HCL 4 MG/2ML IJ SOLN: 4.0000 mg | Freq: Four times a day (QID) | INTRAMUSCULAR | Status: DC | PRN

## 2017-10-13 MED ORDER — INSULIN ASPART (W/NIACINAMIDE) 100 UNIT/ML ~~LOC~~ SOPN: 2.0000 [IU] | PEN_INJECTOR | Freq: Three times a day (TID) | SUBCUTANEOUS | Status: DC

## 2017-10-13 MED ORDER — HYDROCORTISONE 5 MG PO TABS
5.0000 mg | ORAL_TABLET | Freq: Every day | ORAL | Status: DC
  Administered 2017-10-13: 14:00:00 5 mg via ORAL
  Filled 2017-10-13: qty 1

## 2017-10-13 MED ORDER — SODIUM CHLORIDE 0.9% FLUSH
3.0000 mL | Freq: Two times a day (BID) | INTRAVENOUS | Status: DC
  Administered 2017-10-13: 3 mL via INTRAVENOUS

## 2017-10-13 MED ORDER — CLOPIDOGREL BISULFATE 300 MG PO TABS
ORAL_TABLET | ORAL | Status: DC | PRN
  Administered 2017-10-13: 300 mg via ORAL

## 2017-10-13 MED ORDER — MAGNESIUM OXIDE 400 (241.3 MG) MG PO TABS
200.0000 mg | ORAL_TABLET | Freq: Every day | ORAL | Status: DC
  Administered 2017-10-13 – 2017-10-14 (×2): 200 mg via ORAL
  Filled 2017-10-13 (×3): qty 1

## 2017-10-13 MED ORDER — LIDOCAINE HCL (PF) 1 % IJ SOLN
INTRAMUSCULAR | Status: AC
  Filled 2017-10-13: qty 30

## 2017-10-13 MED ORDER — NITROGLYCERIN 1 MG/10 ML FOR IR/CATH LAB
INTRA_ARTERIAL | Status: DC | PRN
  Administered 2017-10-13: 200 ug via INTRACORONARY

## 2017-10-13 MED ORDER — POTASSIUM CHLORIDE CRYS ER 20 MEQ PO TBCR
20.0000 meq | EXTENDED_RELEASE_TABLET | Freq: Every day | ORAL | Status: DC
  Administered 2017-10-13 – 2017-10-14 (×2): 20 meq via ORAL
  Filled 2017-10-13 (×2): qty 1

## 2017-10-13 MED ORDER — MIDAZOLAM HCL 2 MG/2ML IJ SOLN
INTRAMUSCULAR | Status: DC | PRN
  Administered 2017-10-13 (×2): 0.5 mg via INTRAVENOUS
  Administered 2017-10-13 (×2): 1 mg via INTRAVENOUS

## 2017-10-13 MED ORDER — NITROGLYCERIN 1 MG/10 ML FOR IR/CATH LAB
INTRA_ARTERIAL | Status: AC
  Filled 2017-10-13: qty 10

## 2017-10-13 MED ORDER — CLOPIDOGREL BISULFATE 75 MG PO TABS
75.0000 mg | ORAL_TABLET | Freq: Every day | ORAL | Status: DC
  Administered 2017-10-14: 09:00:00 75 mg via ORAL
  Filled 2017-10-13: qty 1

## 2017-10-13 MED ORDER — DULAGLUTIDE 1.5 MG/0.5ML ~~LOC~~ SOAJ: 1.5000 mg | SUBCUTANEOUS | Status: DC

## 2017-10-13 MED ORDER — GALCANEZUMAB-GNLM 120 MG/ML ~~LOC~~ SOAJ: 120.0000 mg | SUBCUTANEOUS | Status: DC

## 2017-10-13 MED ORDER — CLOPIDOGREL BISULFATE 75 MG PO TABS: 75.0000 mg | ORAL_TABLET | Freq: Every day | ORAL | Status: DC

## 2017-10-13 MED ORDER — INSULIN DEGLUDEC 100 UNIT/ML ~~LOC~~ SOPN
40.0000 [IU] | PEN_INJECTOR | Freq: Every day | SUBCUTANEOUS | Status: DC
  Filled 2017-10-13: qty 3

## 2017-10-13 MED ORDER — ACETAMINOPHEN 325 MG PO TABS
650.0000 mg | ORAL_TABLET | ORAL | Status: DC | PRN
  Administered 2017-10-13: 17:00:00 650 mg via ORAL
  Filled 2017-10-13: qty 2

## 2017-10-13 MED ORDER — B COMPLEX-C PO TABS
1.0000 | ORAL_TABLET | Freq: Every day | ORAL | Status: DC
  Administered 2017-10-13 – 2017-10-14 (×2): 1 via ORAL
  Filled 2017-10-13 (×2): qty 1

## 2017-10-13 MED ORDER — NITROGLYCERIN 0.4 MG SL SUBL: 0.4000 mg | SUBLINGUAL_TABLET | SUBLINGUAL | Status: DC | PRN

## 2017-10-13 MED ORDER — SODIUM CHLORIDE 0.9% FLUSH: 3.0000 mL | Freq: Two times a day (BID) | INTRAVENOUS | Status: DC

## 2017-10-13 MED ORDER — IOHEXOL 350 MG/ML SOLN
INTRAVENOUS | Status: DC | PRN
  Administered 2017-10-13: 120 mL via INTRAVENOUS

## 2017-10-13 MED ORDER — UMECLIDINIUM BROMIDE 62.5 MCG/INH IN AEPB
1.0000 | INHALATION_SPRAY | Freq: Every day | RESPIRATORY_TRACT | Status: DC
  Administered 2017-10-14: 09:00:00 1 via RESPIRATORY_TRACT
  Filled 2017-10-13: qty 7

## 2017-10-13 MED ORDER — HEPARIN SODIUM (PORCINE) 5000 UNIT/ML IJ SOLN
5000.0000 [IU] | Freq: Three times a day (TID) | INTRAMUSCULAR | Status: DC
  Administered 2017-10-13 – 2017-10-14 (×2): 5000 [IU] via SUBCUTANEOUS
  Filled 2017-10-13: qty 1

## 2017-10-13 MED ORDER — HYDROCORTISONE 5 MG PO TABS
15.0000 mg | ORAL_TABLET | Freq: Every day | ORAL | Status: DC
  Administered 2017-10-14: 15 mg via ORAL
  Filled 2017-10-13: qty 3

## 2017-10-13 MED ORDER — ATORVASTATIN CALCIUM 20 MG PO TABS: 20.0000 mg | ORAL_TABLET | Freq: Every day | ORAL | Status: DC

## 2017-10-13 MED ORDER — MIDAZOLAM HCL 2 MG/2ML IJ SOLN
INTRAMUSCULAR | Status: AC
  Filled 2017-10-13: qty 2

## 2017-10-13 MED ORDER — BIVALIRUDIN BOLUS VIA INFUSION - CUPID
INTRAVENOUS | Status: DC | PRN
  Administered 2017-10-13: 76.875 mg via INTRAVENOUS

## 2017-10-13 MED ORDER — VERAPAMIL HCL 2.5 MG/ML IV SOLN
INTRAVENOUS | Status: AC
  Filled 2017-10-13: qty 2

## 2017-10-13 MED ORDER — VITAMIN D 1000 UNITS PO TABS
2000.0000 [IU] | ORAL_TABLET | Freq: Every day | ORAL | Status: DC
  Administered 2017-10-13 – 2017-10-14 (×2): 2000 [IU] via ORAL
  Filled 2017-10-13 (×4): qty 2

## 2017-10-13 MED ORDER — PRAMIPEXOLE DIHYDROCHLORIDE 1 MG PO TABS
1.0000 mg | ORAL_TABLET | Freq: Two times a day (BID) | ORAL | Status: DC
  Administered 2017-10-13 – 2017-10-14 (×3): 1 mg via ORAL
  Filled 2017-10-13 (×3): qty 1

## 2017-10-13 MED ORDER — SODIUM CHLORIDE 0.9 % IV SOLN
INTRAVENOUS | Status: DC | PRN
  Administered 2017-10-13: 1.75 mg/kg/h via INTRAVENOUS

## 2017-10-13 MED ORDER — ISOSORBIDE MONONITRATE ER 60 MG PO TB24
60.0000 mg | ORAL_TABLET | Freq: Every day | ORAL | Status: DC
  Administered 2017-10-13 – 2017-10-14 (×2): 60 mg via ORAL
  Filled 2017-10-13 (×2): qty 1

## 2017-10-13 MED ORDER — HEPARIN (PORCINE) IN NACL 2000-0.9 UNIT/L-% IV SOLN
INTRAVENOUS | Status: DC | PRN
  Administered 2017-10-13: 500 mL

## 2017-10-13 MED ORDER — SACUBITRIL-VALSARTAN 24-26 MG PO TABS
1.0000 | ORAL_TABLET | Freq: Two times a day (BID) | ORAL | Status: DC
  Administered 2017-10-13 – 2017-10-14 (×3): 1 via ORAL
  Filled 2017-10-13 (×3): qty 1

## 2017-10-13 MED ORDER — PANTOPRAZOLE SODIUM 40 MG PO TBEC
40.0000 mg | DELAYED_RELEASE_TABLET | Freq: Every day | ORAL | Status: DC
  Administered 2017-10-13 – 2017-10-14 (×2): 40 mg via ORAL
  Filled 2017-10-13 (×3): qty 1

## 2017-10-13 MED ORDER — SODIUM CHLORIDE 0.9 % IV SOLN: 250.0000 mL | INTRAVENOUS | Status: DC | PRN

## 2017-10-13 MED ORDER — INSULIN GLARGINE 100 UNIT/ML ~~LOC~~ SOLN
40.0000 [IU] | Freq: Every day | SUBCUTANEOUS | Status: DC
  Administered 2017-10-13: 22:00:00 40 [IU] via SUBCUTANEOUS
  Filled 2017-10-13: qty 0.4

## 2017-10-13 MED ORDER — OXYBUTYNIN CHLORIDE ER 15 MG PO TB24
15.0000 mg | ORAL_TABLET | Freq: Every day | ORAL | Status: DC
  Administered 2017-10-13: 22:00:00 15 mg via ORAL
  Filled 2017-10-13: qty 1

## 2017-10-13 MED ORDER — HEPARIN (PORCINE) IN NACL 1000-0.9 UT/500ML-% IV SOLN
INTRAVENOUS | Status: AC
  Filled 2017-10-13: qty 1000

## 2017-10-13 MED ORDER — HYDRALAZINE HCL 20 MG/ML IJ SOLN: 5.0000 mg | INTRAMUSCULAR | Status: AC | PRN

## 2017-10-13 MED ORDER — LEVOTHYROXINE SODIUM 150 MCG PO TABS
150.0000 ug | ORAL_TABLET | Freq: Every day | ORAL | Status: DC
  Administered 2017-10-14: 150 ug via ORAL
  Filled 2017-10-13: qty 1
  Filled 2017-10-13: qty 2

## 2017-10-13 MED ORDER — FUROSEMIDE 40 MG PO TABS
40.0000 mg | ORAL_TABLET | Freq: Two times a day (BID) | ORAL | Status: DC
  Administered 2017-10-13 – 2017-10-14 (×2): 40 mg via ORAL
  Filled 2017-10-13 (×2): qty 1

## 2017-10-13 MED ORDER — SODIUM CHLORIDE 0.9 % WEIGHT BASED INFUSION
3.0000 mL/kg/h | INTRAVENOUS | Status: DC
  Administered 2017-10-13: 3 mL/kg/h via INTRAVENOUS

## 2017-10-13 MED ORDER — LABETALOL HCL 5 MG/ML IV SOLN: 10.0000 mg | INTRAVENOUS | Status: AC | PRN

## 2017-10-13 MED ORDER — CLOPIDOGREL BISULFATE 300 MG PO TABS
ORAL_TABLET | ORAL | Status: AC
  Filled 2017-10-13: qty 1

## 2017-10-13 MED ORDER — HEPARIN (PORCINE) IN NACL 1000-0.9 UT/500ML-% IV SOLN
INTRAVENOUS | Status: DC | PRN
  Administered 2017-10-13 (×2): 500 mL

## 2017-10-13 MED ORDER — FUSION PLUS PO CAPS: 1.0000 | ORAL_CAPSULE | Freq: Every day | ORAL | Status: DC

## 2017-10-13 MED ORDER — DOXYCYCLINE HYCLATE 100 MG PO TABS
100.0000 mg | ORAL_TABLET | Freq: Two times a day (BID) | ORAL | Status: DC
  Administered 2017-10-13 – 2017-10-14 (×3): 100 mg via ORAL
  Filled 2017-10-13 (×4): qty 1

## 2017-10-13 MED ORDER — ASPIRIN 81 MG PO CHEW
81.0000 mg | CHEWABLE_TABLET | Freq: Every day | ORAL | Status: DC
  Administered 2017-10-14: 81 mg via ORAL
  Filled 2017-10-13: qty 1

## 2017-10-13 MED ORDER — ASPIRIN 81 MG PO CHEW
81.0000 mg | CHEWABLE_TABLET | ORAL | Status: AC
  Administered 2017-10-13: 81 mg via ORAL

## 2017-10-13 MED ORDER — BIVALIRUDIN TRIFLUOROACETATE 250 MG IV SOLR
INTRAVENOUS | Status: AC
  Filled 2017-10-13: qty 250

## 2017-10-13 MED ORDER — INSULIN ASPART 100 UNIT/ML ~~LOC~~ SOLN
0.0000 [IU] | Freq: Three times a day (TID) | SUBCUTANEOUS | Status: DC
  Administered 2017-10-13: 18:00:00 8 [IU] via SUBCUTANEOUS

## 2017-10-13 MED ORDER — LIDOCAINE HCL (PF) 1 % IJ SOLN
INTRAMUSCULAR | Status: DC | PRN
  Administered 2017-10-13: 4 mL via INTRADERMAL
  Administered 2017-10-13: 20 mL via INTRADERMAL
  Administered 2017-10-13: 2 mL via INTRADERMAL
  Administered 2017-10-13: 22 mL via INTRADERMAL

## 2017-10-13 MED ORDER — SODIUM CHLORIDE 0.9% FLUSH: 3.0000 mL | INTRAVENOUS | Status: DC | PRN

## 2017-10-13 MED ORDER — SODIUM CHLORIDE 0.9 % WEIGHT BASED INFUSION: 1.0000 mL/kg/h | INTRAVENOUS | Status: DC

## 2017-10-13 MED ORDER — ASPIRIN 81 MG PO CHEW
CHEWABLE_TABLET | ORAL | Status: AC
  Administered 2017-10-14: 09:00:00 81 mg via ORAL
  Filled 2017-10-13: qty 1

## 2017-10-13 MED ORDER — FLUTICASONE-UMECLIDIN-VILANT 100-62.5-25 MCG/INH IN AEPB
1.0000 | INHALATION_SPRAY | Freq: Every day | RESPIRATORY_TRACT | Status: DC
  Filled 2017-10-13: qty 1

## 2017-10-13 MED ORDER — SODIUM CHLORIDE 0.9 % IV SOLN
INTRAVENOUS | Status: AC
  Administered 2017-10-13: 13:00:00 via INTRAVENOUS

## 2017-10-13 MED ORDER — SEMAGLUTIDE(0.25 OR 0.5MG/DOS) 2 MG/1.5ML ~~LOC~~ SOPN: 0.5000 mg | PEN_INJECTOR | SUBCUTANEOUS | Status: DC

## 2017-10-13 MED ORDER — FLUDROCORTISONE ACETATE 0.1 MG PO TABS
0.1000 mg | ORAL_TABLET | Freq: Two times a day (BID) | ORAL | Status: DC
  Administered 2017-10-13 – 2017-10-14 (×3): 0.1 mg via ORAL
  Filled 2017-10-13 (×3): qty 1

## 2017-10-13 MED ORDER — ATORVASTATIN CALCIUM 80 MG PO TABS
80.0000 mg | ORAL_TABLET | Freq: Every day | ORAL | Status: DC
  Administered 2017-10-13: 17:00:00 80 mg via ORAL
  Filled 2017-10-13: qty 1

## 2017-10-13 MED ORDER — FLUTICASONE FUROATE-VILANTEROL 100-25 MCG/INH IN AEPB
1.0000 | INHALATION_SPRAY | Freq: Every day | RESPIRATORY_TRACT | Status: DC
  Administered 2017-10-14: 1 via RESPIRATORY_TRACT
  Filled 2017-10-13: qty 28

## 2017-10-13 MED ORDER — HYDROCODONE-ACETAMINOPHEN 5-325 MG PO TABS
1.0000 | ORAL_TABLET | ORAL | Status: DC | PRN
  Administered 2017-10-13 (×2): 1 via ORAL
  Filled 2017-10-13 (×2): qty 1

## 2017-10-13 MED ORDER — MORPHINE SULFATE (PF) 2 MG/ML IV SOLN
2.0000 mg | Freq: Once | INTRAVENOUS | Status: AC
  Administered 2017-10-13: 14:00:00 2 mg via INTRAVENOUS
  Filled 2017-10-13: qty 1

## 2017-10-13 MED ORDER — DULOXETINE HCL 60 MG PO CPEP
60.0000 mg | ORAL_CAPSULE | Freq: Every day | ORAL | Status: DC
  Administered 2017-10-13 – 2017-10-14 (×2): 60 mg via ORAL
  Filled 2017-10-13 (×2): qty 1

## 2017-10-13 SURGICAL SUPPLY — 19 items
BALLN SAPPHIRE ~~LOC~~ 3.5X15 (BALLOONS) ×2 IMPLANT
CATH BALLN WEDGE 5F 110CM (CATHETERS) ×2 IMPLANT
CATH INFINITI 5FR JL4 (CATHETERS) ×2 IMPLANT
CATH INFINITI JR4 5F (CATHETERS) ×2 IMPLANT
CATH LAUNCHER 6FR JR4 (CATHETERS) ×2 IMPLANT
COVER PRB 48X5XTLSCP FOLD TPE (BAG) ×1 IMPLANT
COVER PROBE 5X48 (BAG) ×1
GLIDESHEATH SLEND A-KIT 6F 22G (SHEATH) ×2 IMPLANT
GUIDEWIRE .025 260CM (WIRE) ×2 IMPLANT
KIT ENCORE 26 ADVANTAGE (KITS) ×2 IMPLANT
KIT HEART LEFT (KITS) ×2 IMPLANT
PACK CARDIAC CATHETERIZATION (CUSTOM PROCEDURE TRAY) ×2 IMPLANT
SHEATH GLIDE SLENDER 4/5FR (SHEATH) ×2 IMPLANT
SHEATH PINNACLE 5F 10CM (SHEATH) ×2 IMPLANT
SHEATH PINNACLE 6F 10CM (SHEATH) ×2 IMPLANT
TRANSDUCER W/STOPCOCK (MISCELLANEOUS) ×2 IMPLANT
TUBING CIL FLEX 10 FLL-RA (TUBING) ×2 IMPLANT
WIRE ASAHI PROWATER 180CM (WIRE) ×2 IMPLANT
WIRE EMERALD 3MM-J .035X150CM (WIRE) ×2 IMPLANT

## 2017-10-13 NOTE — Interval H&P Note (Signed)
History and Physical Interval Note:  10/13/2017 9:15 AM Cath Lab Visit (complete for each Cath Lab visit)  Clinical Evaluation Leading to the Procedure:   ACS: No.  Non-ACS:    Anginal Classification: CCS III  Anti-ischemic medical therapy: Minimal Therapy (1 class of medications)  Non-Invasive Test Results: No non-invasive testing performed  Prior CABG: No previous CABG        Crystal Yates  has presented today for surgery, with the diagnosis of shortness of breath - cad  The various methods of treatment have been discussed with the patient and family. After consideration of risks, benefits and other options for treatment, the patient has consented to  Procedure(s): RIGHT/LEFT HEART CATH AND CORONARY ANGIOGRAPHY (N/A) as a surgical intervention .  The patient's history has been reviewed, patient examined, no change in status, stable for surgery.  I have reviewed the patient's chart and labs.  Questions were answered to the patient's satisfaction.     [name]

## 2017-10-14 DIAGNOSIS — F17210 Nicotine dependence, cigarettes, uncomplicated: Secondary | ICD-10-CM | POA: Diagnosis not present

## 2017-10-14 DIAGNOSIS — Z888 Allergy status to other drugs, medicaments and biological substances status: Secondary | ICD-10-CM | POA: Diagnosis not present

## 2017-10-14 DIAGNOSIS — I5042 Chronic combined systolic (congestive) and diastolic (congestive) heart failure: Secondary | ICD-10-CM | POA: Diagnosis not present

## 2017-10-14 DIAGNOSIS — Z7982 Long term (current) use of aspirin: Secondary | ICD-10-CM | POA: Diagnosis not present

## 2017-10-14 DIAGNOSIS — I130 Hypertensive heart and chronic kidney disease with heart failure and stage 1 through stage 4 chronic kidney disease, or unspecified chronic kidney disease: Secondary | ICD-10-CM | POA: Diagnosis not present

## 2017-10-14 DIAGNOSIS — E119 Type 2 diabetes mellitus without complications: Secondary | ICD-10-CM

## 2017-10-14 DIAGNOSIS — E785 Hyperlipidemia, unspecified: Secondary | ICD-10-CM | POA: Diagnosis not present

## 2017-10-14 DIAGNOSIS — I25119 Atherosclerotic heart disease of native coronary artery with unspecified angina pectoris: Secondary | ICD-10-CM | POA: Diagnosis not present

## 2017-10-14 DIAGNOSIS — Z794 Long term (current) use of insulin: Secondary | ICD-10-CM | POA: Diagnosis not present

## 2017-10-14 DIAGNOSIS — Z7901 Long term (current) use of anticoagulants: Secondary | ICD-10-CM | POA: Diagnosis not present

## 2017-10-14 DIAGNOSIS — E1122 Type 2 diabetes mellitus with diabetic chronic kidney disease: Secondary | ICD-10-CM | POA: Diagnosis not present

## 2017-10-14 DIAGNOSIS — E063 Autoimmune thyroiditis: Secondary | ICD-10-CM | POA: Diagnosis not present

## 2017-10-14 DIAGNOSIS — I255 Ischemic cardiomyopathy: Secondary | ICD-10-CM | POA: Diagnosis not present

## 2017-10-14 DIAGNOSIS — J449 Chronic obstructive pulmonary disease, unspecified: Secondary | ICD-10-CM | POA: Diagnosis not present

## 2017-10-14 DIAGNOSIS — Z955 Presence of coronary angioplasty implant and graft: Secondary | ICD-10-CM | POA: Diagnosis not present

## 2017-10-14 DIAGNOSIS — I25118 Atherosclerotic heart disease of native coronary artery with other forms of angina pectoris: Secondary | ICD-10-CM | POA: Diagnosis not present

## 2017-10-14 DIAGNOSIS — I272 Pulmonary hypertension, unspecified: Secondary | ICD-10-CM | POA: Diagnosis not present

## 2017-10-14 DIAGNOSIS — N182 Chronic kidney disease, stage 2 (mild): Secondary | ICD-10-CM | POA: Diagnosis not present

## 2017-10-14 DIAGNOSIS — I5023 Acute on chronic systolic (congestive) heart failure: Secondary | ICD-10-CM | POA: Diagnosis not present

## 2017-10-14 DIAGNOSIS — Z9862 Peripheral vascular angioplasty status: Secondary | ICD-10-CM

## 2017-10-14 LAB — GLUCOSE, CAPILLARY: GLUCOSE-CAPILLARY: 116 mg/dL — AB (ref 70–99)

## 2017-10-14 LAB — BASIC METABOLIC PANEL
Anion gap: 5 (ref 5–15)
BUN: 14 mg/dL (ref 8–23)
CALCIUM: 8.6 mg/dL — AB (ref 8.9–10.3)
CO2: 31 mmol/L (ref 22–32)
Chloride: 106 mmol/L (ref 98–111)
Creatinine, Ser: 0.94 mg/dL (ref 0.44–1.00)
Glucose, Bld: 129 mg/dL — ABNORMAL HIGH (ref 70–99)
Potassium: 4 mmol/L (ref 3.5–5.1)
SODIUM: 142 mmol/L (ref 135–145)

## 2017-10-14 LAB — CBC
HCT: 35.9 % — ABNORMAL LOW (ref 36.0–46.0)
Hemoglobin: 10.8 g/dL — ABNORMAL LOW (ref 12.0–15.0)
MCH: 29.8 pg (ref 26.0–34.0)
MCHC: 30.1 g/dL (ref 30.0–36.0)
MCV: 99.2 fL (ref 78.0–100.0)
Platelets: 182 10*3/uL (ref 150–400)
RBC: 3.62 MIL/uL — AB (ref 3.87–5.11)
RDW: 16.5 % — ABNORMAL HIGH (ref 11.5–15.5)
WBC: 7.9 10*3/uL (ref 4.0–10.5)

## 2017-10-14 MED ORDER — ACETAMINOPHEN 325 MG PO TABS: 650.0000 mg | ORAL_TABLET | ORAL | Status: DC | PRN

## 2017-10-14 MED ORDER — ASPIRIN 81 MG PO CHEW: 81.0000 mg | CHEWABLE_TABLET | Freq: Every day | ORAL | Status: DC

## 2017-10-14 MED ORDER — ATORVASTATIN CALCIUM 80 MG PO TABS: 80.0000 mg | ORAL_TABLET | Freq: Every day | ORAL | 6 refills | Status: DC

## 2017-10-14 MED ORDER — ORAL CARE MOUTH RINSE
15.0000 mL | Freq: Two times a day (BID) | OROMUCOSAL | Status: DC
  Administered 2017-10-14: 15 mL via OROMUCOSAL

## 2017-10-14 NOTE — Progress Notes (Addendum)
[phone] Patient walked with RN this morning, declined to walk again at this time. Education complete including risk factor modification, CP, NTG use, and calling 911, tobacco cessation, diabetic and low sodium diet, and activity progression. Pt needs reinforcement regarding risk factor modification. Discussed Phase 2 cardiac rehab, and pt is interested in the program at [facility]. Patient previously participated in the program at [facility] for a short time after prior stent. Sol Passer, MS, ACSM CEP

## 2017-10-14 NOTE — Discharge Instructions (Signed)
Coronary Angiogram With Stent, Care After This sheet gives you information about how to care for yourself after your procedure. Your health care provider may also give you more specific instructions. If you have problems or questions, contact your health care provider. What can I expect after the procedure? After your procedure, it is common to have:  Bruising in the area where a small, thin tube (catheter) was inserted. This usually fades within 1-2 weeks.  Blood collecting in the tissue (hematoma) that may be painful to the touch. It should usually decrease in size and tenderness within 1-2 weeks.  Follow these instructions at home: Insertion area care  Do not take baths, swim, or use a hot tub until your health care provider approves.  You may shower 24-48 hours after the procedure or as directed by your health care provider.  Follow instructions from your health care provider about how to take care of your incision. Make sure you: ? Wash your hands with soap and water before you change your bandage (dressing). If soap and water are not available, use hand sanitizer. ? Change your dressing as told by your health care provider. ? Leave stitches (sutures), skin glue, or adhesive strips in place. These skin closures may need to stay in place for 2 weeks or longer. If adhesive strip edges start to loosen and curl up, you may trim the loose edges. Do not remove adhesive strips completely unless your health care provider tells you to do that.  Remove the bandage (dressing) and gently wash the catheter insertion site with plain soap and water.  Pat the area dry with a clean towel. Do not rub the area, because that may cause bleeding.  Do not apply powder or lotion to the incision area.  Check your incision area every day for signs of infection. Check for: ? More redness, swelling, or pain. ? More fluid or blood. ? Warmth. ? Pus or a bad smell. Activity  Do not drive for 24 hours if  you were given a medicine to help you relax (sedative).  Do not lift anything that is heavier than 10 lb (4.5 kg) for 5 days after your procedure or as directed by your health care provider.  Ask your health care provider when it is okay for you: ? To return to work or school. ? To resume usual physical activities or sports. ? To resume sexual activity. Eating and drinking  Eat a heart-healthy diet. This should include plenty of fresh fruits and vegetables.  Avoid the following types of food: ? Food that is high in salt. ? Canned or highly processed food. ? Food that is high in saturated fat or sugar. ? [employer].  Limit alcohol intake to no more than 1 drink a day for non-pregnant women and 2 drinks a day for men. One drink equals 12 oz of beer, 5 oz of wine, or 1 oz of hard liquor. Lifestyle  Do not use any products that contain nicotine or tobacco, such as cigarettes and e-cigarettes. If you need help quitting, ask your health care provider.  Take steps to manage and control your weight.  Get regular exercise.  Manage your blood pressure.  Manage other health problems, such as diabetes. General instructions  Take over-the-counter and prescription medicines only as told by your health care provider. Blood thinners may be prescribed after your procedure to improve blood flow through the stent.  If you need an MRI after your heart stent has been placed,  be sure to tell the health care provider who orders the MRI that you have a heart stent.  Keep all follow-up visits as directed by your health care provider. This is important. Contact a health care provider if:  You have a fever.  You have chills.  You have increased bleeding from the catheter insertion area. Hold pressure on the area. Get help right away if:  You develop chest pain or shortness of breath.  You feel faint or you pass out.  You have unusual pain at the catheter insertion area.  You have redness,  warmth, or swelling at the catheter insertion area.  You have drainage (other than a small amount of blood on the dressing) from the catheter insertion area.  The catheter insertion area is bleeding, and the bleeding does not stop after 30 minutes of holding steady pressure on the area.  You develop bleeding from any other place, such as from your rectum. There may be bright red blood in your urine or stool, or it may appear as black, tarry stool. This information is not intended to replace advice given to you by your health care provider. Make sure you discuss any questions you have with your health care provider. Document Released: 10/01/2004 Document Revised: 12/10/2015 Document Reviewed: 12/10/2015 Elsevier Interactive Patient Education  2018 [place] at [phone] if any bleeding, swelling or drainage at cath site.  May shower, no tub baths for 48 hours for groin sticks. No lifting over 5 pounds for 3 days.  No Driving for 3 days  Low salt diet along with diabetic diet.  Weigh daily and call if wt up 3 lbs in a day or 5 lbs in a week.  stop tobacco.  Keep taking asprin and plavix.

## 2017-10-14 NOTE — Progress Notes (Addendum)
Progress Note  Patient Name: Crystal Yates Date of Encounter: 10/14/2017  Primary Cardiologist: Jenne Campus, MD   Subjective   A little SOB no pain  Inpatient Medications    Scheduled Meds: . acidophilus  1 capsule Oral Daily   And  . B-complex with vitamin C  1 tablet Oral Daily  . aspirin  81 mg Oral Daily  . atorvastatin  80 mg Oral q1800  . carvedilol  3.125 mg Oral BID WC  . cholecalciferol  2,000 Units Oral Daily  . clopidogrel  75 mg Oral Q breakfast  . doxycycline  100 mg Oral BID  . [START ON 10/15/2017] Dulaglutide  1.5 mg Subcutaneous Q Sun  . DULoxetine  60 mg Oral Daily  . fludrocortisone  0.1 mg Oral BID  . fluticasone furoate-vilanterol  1 puff Inhalation Daily  . furosemide  40 mg Oral BID  . [START ON 11/13/2018] Galcanezumab-gnlm  120 mg Subcutaneous Q30 days  . heparin  5,000 Units Subcutaneous Q8H  . hydrocortisone  15 mg Oral Q breakfast  . hydrocortisone  5 mg Oral Daily  . insulin aspart  0-15 Units Subcutaneous TID WC  . insulin glargine  40 Units Subcutaneous QHS  . isosorbide mononitrate  60 mg Oral Daily  . levothyroxine  150 mcg Oral QAC breakfast  . magnesium oxide  200 mg Oral Daily  . mouth rinse  15 mL Mouth Rinse BID  . oxybutynin  15 mg Oral QHS  . pantoprazole  40 mg Oral Daily  . potassium chloride SA  20 mEq Oral Daily  . pramipexole  1 mg Oral BID  . sacubitril-valsartan  1 tablet Oral BID  . [START ON 10/25/2017] Semaglutide  0.5 mg Subcutaneous Q30 days  . sodium chloride flush  3 mL Intravenous Q12H  . umeclidinium bromide  1 puff Inhalation Daily   Continuous Infusions: . sodium chloride     PRN Meds: sodium chloride, acetaminophen, HYDROcodone-acetaminophen, nitroGLYCERIN, ondansetron (ZOFRAN) IV, sodium chloride flush   Vital Signs    Vitals:   10/14/17 0300 10/14/17 0500 10/14/17 0547 10/14/17 0731  BP: (!) 110/53 128/63  (!) 94/57  Pulse: 77 81  64  Resp: 17 16  20   Temp: 97.9 F (36.6 C)   98  F (36.7 C)  TempSrc: Oral     SpO2: 99% 99%  100%  Weight: 239 lb 3.2 oz (108.5 kg)  228 lb 6.4 oz (103.6 kg)   Height:        Intake/Output Summary (Last 24 hours) at 10/14/2017 0810 Last data filed at 10/14/2017 0550 Gross per 24 hour  Intake 739.57 ml  Output 450 ml  Net 289.57 ml   Filed Weights   10/13/17 0716 10/14/17 0300 10/14/17 0547  Weight: 226 lb (102.5 kg) 239 lb 3.2 oz (108.5 kg) 228 lb 6.4 oz (103.6 kg)    Telemetry    SR - Personally Reviewed  ECG    SR no acute changes.* - Personally Reviewed  Physical Exam    Per Dr. Claiborne Billings GEN: No acute distress.   Neck: No JVD Cardiac: RRR, no murmurs, rubs, or gallops.  Respiratory: Clear to auscultation bilaterally. GI: Soft, nontender, non-distended  MS: No edema; No deformity. Neuro:  Nonfocal  Psych: Normal affect   Labs    Chemistry Recent Labs  Lab 10/11/17 1434 10/14/17 0214  NA 143 142  K 3.6 4.0  CL 104 106  CO2 30 31  GLUCOSE 156* 129*  BUN 21  14  CREATININE 1.12* 0.94  CALCIUM 9.1 8.6*  GFRNONAA 52* >60  GFRAA 60* >60  ANIONGAP 9 5     Hematology Recent Labs  Lab 10/11/17 1434 10/14/17 0214  WBC 7.8 7.9  RBC 4.00 3.62*  HGB 12.1 10.8*  HCT 39.2 35.9*  MCV 98.0 99.2  MCH 30.3 29.8  MCHC 30.9 30.1  RDW 16.8* 16.5*  PLT 228 182    Cardiac EnzymesNo results for input(s): TROPONINI in the last 168 hours.  Recent Labs  Lab 10/11/17 1457 10/11/17 1801  TROPIPOC 0.01 0.00     BNP Recent Labs  Lab 10/11/17 1434  BNP 147.7*     DDimer No results for input(s): DDIMER in the last 168 hours.   Radiology    No results found.  Cardiac Studies    Normal left main.  Proximal to mid LAD stent with diffuse 30% restenosis.  Proximal circumflex with an eccentric 50 to 65% stenosis depending upon review..  Progression at this site is noted when compared to one year ago when this region contained 30% narrowing.  FFR was not possible due to COPD and intolerance of  adenosine.  Dominant right coronary with proximal to distal stenting and overlap fashion, possibly with some region of stent within stent.  Distally there is an eccentric 70 to 75% stenosis that is clearly progressed when compared to one year ago when there was less than 50% narrowing.  Mild pulmonary hypertension with mean PA pressure of 30 mmHg.  Reduced LV systolic function with regional wall motion abnormality including severe hypokinesis of the inferobasal wall.  Estimated EF 40 to 45%.  LVEDP is 20 mmHg.  Mean pulmonary capillary wedge pressure is 20 mmHg.  Successful balloon angioplasty of distal right coronary ISR reducing from 75% to 0% with TIMI grade III flow using a 3.5 mm [place] balloon x2 inflations.  RECOMMENDATIONS:  Dual antiplatelet therapy indefinitely  Risk factor modification.  We discussed smoking cessation.  LDL less than 70.  Bivalirudin is discontinued and sheath will be removed later this afternoon.  Monitor closely for bleeding/access site hematoma.  If stable in a.m. without access site complication, she will be eligible for discharge.  Consider circumflex PCI if persistent symptoms/angina.  Recommend to resume Aspirin and Plavix, at currently prescribed dose and frequency, on To be continued indefinitely..  Indefinite  Patient Profile     62 y.o. female  with multiple medical problems she does have ischemic cardiomyopathy.  Had been increasing SOB but did not feel improving.  We did cardiac catheterization year ago which showed nonobstructive disease.  Apparently mild to moderate in-stent stenosis of LAD.  Pt admitted for cardiac cath.  Assessment & Plan    CAD with prior stent to LAD  See below   Dyspnea -anginal equivalent- cath with  Successful balloon angioplasty of distal right coronary ISR reducing from 75% to 0% with TIMI grade III flow using a 3.5 mm [place] balloon x2 inflations. --consider PCI LCX if symptoms persist. --DAPT indefinitely asa and  plavix.  --has appt 11/07/17  Ambulating with mild SOB.    ICM EF 40 % on echo 03/2017   DM-2 --glucose elevated resume home meds   For questions or updates, please contact [facility] Please consult [email or web address] for contact info under Cardiology/STEMI.      Signed, Cecilie Kicks, NP  10/14/2017, 8:10 AM     Patient seen and examined. Agree with assessment and plan. No recurrent chest pain. Mild ecchymosis R forearm,  R antecubital site stable, R groin site without hematoma.  1 - 2+ pitting edema.  Cath/PCI data reviewed.  Continue DAPT indefinitely. Support stocking. DC today with close F/U woith Dr. Agustin Cree for additional edema management.     Troy Sine, MD, [facility] 10/14/2017 8:30 AM

## 2017-10-14 NOTE — Discharge Summary (Signed)
Discharge Summary    Patient ID: Crystal Yates,  MRN: [id number], DOB/AGE: 1956-03-17 62 y.o.  Admit date: 10/13/2017 Discharge date: 10/14/2017  Primary Care Provider: Dortha Kern Primary Cardiologist: Jenne Campus, MD  Discharge Diagnoses    Principal Problem:   Chest pain Active Problems:   S/P angioplasty for ISR of dRCA 10/13/17   Acute on chronic systolic heart failure exacerbation(HCC)   Coronary artery disease   Diabetes mellitus with no complication (HCC)   History of coronary artery disease   CAD (coronary artery disease)   Allergies Allergies  Allergen Reactions  . Hydroxychloroquine Shortness Of Breath, Nausea Only and Other (See Comments)    Dizziness (also)  . Donepezil Other (See Comments)    Dizziness, depression, and makes the patient feel "funny"  . Prednisone Other (See Comments)    Causes depression and suicidal thoughts  . Bupropion Other (See Comments)    Suicidal thoughts  . Metrizamide Other (See Comments)    (a non-ionic radiopaque contrast agent) "Blows the vein" and contrast gathers at the injected site's limb  . Varenicline Other (See Comments)    Suicidal thoughts  . Tape Rash and Other (See Comments)    Paper tape is preferred, PLEASE    Diagnostic Studies/Procedures    Cardiac cath and PCI  10/13/17  Normal left main.  Proximal to mid LAD stent with diffuse 30% restenosis.  Proximal circumflex with an eccentric 50 to 65% stenosis depending upon review..  Progression at this site is noted when compared to one year ago when this region contained 30% narrowing.  FFR was not possible due to COPD and intolerance of adenosine.  Dominant right coronary with proximal to distal stenting and overlap fashion, possibly with some region of stent within stent.  Distally there is an eccentric 70 to 75% stenosis that is clearly progressed when compared to one year ago when there was less than 50% narrowing.  Mild pulmonary  hypertension with mean PA pressure of 30 mmHg.  Reduced LV systolic function with regional wall motion abnormality including severe hypokinesis of the inferobasal wall.  Estimated EF 40 to 45%.  LVEDP is 20 mmHg.  Mean pulmonary capillary wedge pressure is 20 mmHg.  Successful balloon angioplasty of distal right coronary ISR reducing from 75% to 0% with TIMI grade III flow using a 3.5 mm  balloon x2 inflations.  RECOMMENDATIONS:  Dual antiplatelet therapy indefinitely  Risk factor modification.  We discussed smoking cessation.  LDL less than 70.  Bivalirudin is discontinued and sheath will be removed later this afternoon.  Monitor closely for bleeding/access site hematoma.  If stable in a.m. without access site complication, she will be eligible for discharge.  Consider circumflex PCI if persistent symptoms/angina.  Recommend to resume Aspirin and Plavix, at currently prescribed dose and frequency, on To be continued indefinitely..  Indefinite _____________   History of Present Illness     62 yo female with prior CAD with prior stent to LAD and RCA, chronic combined systolic and diastolic HF, COPD, DM-2 on insulin HLD, HTN and tobacco use.  Pt with increased SOB and lasix was not always helping.   Pt did want cardiac cath and presented 10/13/17 for cath.   Hospital Course     Consultants: none    Pt underwent cardiac cath without complications and did have ISR within distal RCA and underwent angioplasty.   If symptoms continue thought should be to consider LCX PCI.  See cath report.  By  the next AM no chest pain, mild SOB, she did have lower ext edema to mild degree support stockings were recommended.  Pt ambulated with cardiac rehab and did well,  Dicussed tobacco cessation.  Recommended phase II cardiac rehab.  She would [facility] at [facility].    Has been seen and evaluated by DR.Claiborne Billings and will be discharged home.  Would prefer earlier follow up than what is arranged.   Message sent to scheduler.    _____________  Discharge Vitals Blood pressure (!) 94/57, pulse 64, temperature 98 F (36.7 C), resp. rate 20, height 5\' 2"  (1.575 m), weight 228 lb 6.4 oz (103.6 kg), SpO2 100 %.  Filed Weights   10/13/17 0716 10/14/17 0300 10/14/17 0547  Weight: 226 lb (102.5 kg) 239 lb 3.2 oz (108.5 kg) 228 lb 6.4 oz (103.6 kg)    Labs & Radiologic Studies    CBC Recent Labs    10/11/17 1434 10/14/17 0214  WBC 7.8 7.9  HGB 12.1 10.8*  HCT 39.2 35.9*  MCV 98.0 99.2  PLT 228 063   Basic Metabolic Panel Recent Labs    10/11/17 1434 10/14/17 0214  NA 143 142  K 3.6 4.0  CL 104 106  CO2 30 31  GLUCOSE 156* 129*  BUN 21 14  CREATININE 1.12* 0.94  CALCIUM 9.1 8.6*   Liver Function Tests No results for input(s): AST, ALT, ALKPHOS, BILITOT, PROT, ALBUMIN in the last 72 hours. No results for input(s): LIPASE, AMYLASE in the last 72 hours. Cardiac Enzymes No results for input(s): CKTOTAL, CKMB, CKMBINDEX, TROPONINI in the last 72 hours. BNP Invalid input(s): POCBNP D-Dimer No results for input(s): DDIMER in the last 72 hours. Hemoglobin A1C No results for input(s): HGBA1C in the last 72 hours. Fasting Lipid Panel No results for input(s): CHOL, HDL, LDLCALC, TRIG, CHOLHDL, LDLDIRECT in the last 72 hours. Thyroid Function Tests No results for input(s): TSH, T4TOTAL, T3FREE, THYROIDAB in the last 72 hours.  Invalid input(s): FREET3 _____________  Dg Chest 2 View  Result Date: 10/11/2017 CLINICAL DATA:  Shortness of breath EXAM: CHEST - 2 VIEW COMPARISON:  10/11/2017 FINDINGS: Left basilar atelectasis. No focal airspace consolidation or pulmonary edema. Normal cardiomediastinal contours. Lower cervical ACDF. IMPRESSION: No active cardiopulmonary disease. Electronically Signed   By: Ulyses Jarred M.D.   On: 10/11/2017 15:34   Ct Angio Chest Pe W/cm &/or Wo Cm  Result Date: 10/11/2017 CLINICAL DATA:  Pt c/o SOB, dyspnea, fatigue and weight gain x 2 days No  hx of blood clots per pt Current smoker, COPD, DM EXAM: CT ANGIOGRAPHY CHEST WITH CONTRAST TECHNIQUE: Multidetector CT imaging of the chest was performed using the standard protocol during bolus administration of intravenous contrast. Multiplanar CT image reconstructions and MIPs were obtained to evaluate the vascular anatomy. CONTRAST:  110mL ISOVUE-370 IOPAMIDOL (ISOVUE-370) INJECTION 76% COMPARISON:  07/04/2017 FINDINGS: Cardiovascular: Heart size upper limits normal. No pericardial effusion. Satisfactory opacification of pulmonary arteries noted, and there is no evidence of pulmonary emboli. Coronary calcifications. Adequate contrast opacification of the thoracic aorta with no evidence of dissection, aneurysm, or stenosis. There is classic 3-vessel brachiocephalic arch anatomy without proximal stenosis. Calcified plaque in the arch and descending thoracic segment. Mediastinum/Nodes: Asymmetric left lobe thyroid enlargement with substernal extension. No hilar or mediastinal adenopathy. Lungs/Pleura: No pleural effusion. No pneumothorax. Emphysematous changes in the lung apices. Upper Abdomen: Cholecystectomy clips.  No acute findings. Musculoskeletal: Cervical fixation hardware. Negative for fracture or worrisome bone lesion. Review of the MIP images confirms  the above findings. IMPRESSION: 1. Negative for acute PE or thoracic aortic dissection. 2. Coronary and Aortic Atherosclerosis (ICD10-170.0). Electronically Signed   By: Lucrezia Europe M.D.   On: 10/11/2017 20:20   Dg Chest Portable 1 View  Result Date: 09/16/2017 CLINICAL DATA:  Shortness of breath. EXAM: PORTABLE CHEST 1 VIEW COMPARISON:  Radiograph of September 10, 2017. FINDINGS: The heart size and mediastinal contours are within normal limits. No pneumothorax is noted. Minimal bibasilar subsegmental atelectasis is noted. Minimal left pleural effusion may be present. The visualized skeletal structures are unremarkable. IMPRESSION: Minimal bibasilar  subsegmental atelectasis. Minimal left pleural effusion. Electronically Signed   By: Marijo Conception, M.D.   On: 09/16/2017 16:02   Disposition   Pt is being discharged home today in good condition.  Follow-up Plans & Appointments   Call [facility] at [phone] if any bleeding, swelling or drainage at cath site.  May shower, no tub baths for 48 hours for groin sticks. No lifting over 5 pounds for 3 days.  No Driving for 3 days  Low salt diet along with diabetic diet.  Weigh daily and call if wt up 3 lbs in a day or 5 lbs in a week.  stop tobacco.  Keep taking asprin and plavix.   Follow-up Information    Park Liter, MD Follow up.   Specialty:  Cardiology Why:  the office should call you to be seen in 2 weeks.  please call them if you have not heard.  Contact information: [place] [address] [phone]          Discharge Instructions    Amb Referral to Cardiac Rehabilitation   Complete by:  As directed    Patient lives in [place]. Referral will be sent to [facility] in [place], [place].   Diagnosis:  PTCA      Discharge Medications   Allergies as of 10/14/2017      Reactions   Hydroxychloroquine Shortness Of Breath, Nausea Only, Other (See Comments)   Dizziness (also)   Donepezil Other (See Comments)   Dizziness, depression, and makes the patient feel "funny"   Prednisone Other (See Comments)   Causes depression and suicidal thoughts   Bupropion Other (See Comments)   Suicidal thoughts   Metrizamide Other (See Comments)   (a non-ionic radiopaque contrast agent) "Blows the vein" and contrast gathers at the injected site's limb   Varenicline Other (See Comments)   Suicidal thoughts   Tape Rash, Other (See Comments)   Paper tape is preferred, PLEASE      Medication List    STOP taking these medications   predniSONE 10 MG tablet Commonly known as:  DELTASONE     TAKE these medications     acetaminophen 325 MG tablet Commonly known as:  TYLENOL Take 2 tablets (650 mg total) by mouth every 4 (four) hours as needed for headache or mild pain.   aspirin 81 MG chewable tablet Chew 1 tablet (81 mg total) by mouth daily.   atorvastatin 80 MG tablet Commonly known as:  LIPITOR Take 1 tablet (80 mg total) by mouth daily at 6 PM. What changed:    medication strength  how much to take  when to take this   CALCIUM PO Take 1 tablet by mouth daily.   carvedilol 3.125 MG tablet Commonly known as:  COREG Take 3.125 mg by mouth 2 (two) times daily with a meal.   clopidogrel 75 MG tablet  Commonly known as:  PLAVIX Take 75 mg by mouth daily.   doxycycline 100 MG capsule Commonly known as:  VIBRAMYCIN Take 100 mg by mouth 2 (two) times daily.   DULoxetine 60 MG capsule Commonly known as:  CYMBALTA Take 60 mg by mouth once a day   EMERGEN-C IMMUNE PO Take 1 capsule by mouth daily. CHEW   EMGALITY 120 MG/ML Soaj Generic drug:  Galcanezumab-gnlm Inject 120 mg into the skin every 30 (thirty) days.   FIASP FLEXTOUCH 100 UNIT/ML Sopn Generic drug:  Insulin Aspart (w/Niacinamide) Inject 2-14 Units into the skin 3 (three) times daily before meals. Per sliding scale   fludrocortisone 0.1 MG tablet Commonly known as:  FLORINEF Take 0.1 mg by mouth 2 (two) times daily.   furosemide 80 MG tablet Commonly known as:  LASIX Take 80 mg (2 tablets) twice daily. What changed:    how much to take  how to take this  when to take this  additional instructions   FUSION PLUS Caps Take 1 capsule by mouth daily.   hydrocortisone 5 MG tablet Commonly known as:  CORTEF Take 5-15 mg by mouth See admin instructions. Take 15 mg by mouth in the morning and 5 mg in the afternoon   isosorbide mononitrate 60 MG 24 hr tablet Commonly known as:  IMDUR Take 1 tablet (60 mg total) by mouth daily.   levothyroxine 150 MCG tablet Commonly known as:  SYNTHROID, LEVOTHROID Take 150  mcg by mouth daily before breakfast.   Magnesium 250 MG Tabs Take 250 mg by mouth daily.   nitroGLYCERIN 0.4 MG SL tablet Commonly known as:  NITROSTAT Place 0.4 mg under the tongue every 5 (five) minutes as needed for chest pain.   oxybutynin 15 MG 24 hr tablet Commonly known as:  DITROPAN XL Take 15 mg by mouth at bedtime.   OZEMPIC 0.25 or 0.5 MG/DOSE Sopn Generic drug:  Semaglutide Inject 0.5 mg into the skin every 30 (thirty) days.   potassium chloride SA 20 MEQ tablet Commonly known as:  K-DUR,KLOR-CON Take 1 tablet (20 mEq total) by mouth daily.   pramipexole 1 MG tablet Commonly known as:  MIRAPEX Take 1 mg by mouth 2 (two) times daily.   sacubitril-valsartan 24-26 MG Commonly known as:  ENTRESTO Take 1 tablet by mouth 2 (two) times daily.   TRELEGY ELLIPTA 100-62.5-25 MCG/INH Aepb Generic drug:  Fluticasone-Umeclidin-Vilant Inhale 1 puff into the lungs daily.   TRESIBA FLEXTOUCH 100 UNIT/ML Sopn FlexTouch Pen Generic drug:  insulin degludec Inject 40 Units into the skin at bedtime.   TRULICITY 1.5 SN/0.5LZ Sopn Generic drug:  Dulaglutide Inject 1.5 mg into the skin every Sunday.   Vitamin D3 2000 units capsule Take 2,000 Units by mouth daily.        Acute coronary syndrome (MI, NSTEMI, STEMI, etc) this admission?:  No.    Outstanding Labs/Studies   BMP   Duration of Discharge Encounter   Greater than 30 minutes including physician time.  Signed, Cecilie Kicks, NP 10/14/2017, 9:16 AM

## 2017-10-16 ENCOUNTER — Other Ambulatory Visit: Payer: Self-pay | Admitting: *Deleted

## 2017-10-16 ENCOUNTER — Telehealth: Payer: Self-pay | Admitting: Cardiology

## 2017-10-16 DIAGNOSIS — R0600 Dyspnea, unspecified: Secondary | ICD-10-CM

## 2017-10-16 DIAGNOSIS — R0609 Other forms of dyspnea: Principal | ICD-10-CM

## 2017-10-16 DIAGNOSIS — Z01818 Encounter for other preprocedural examination: Secondary | ICD-10-CM

## 2017-10-16 MED FILL — Nitroglycerin IV Soln 100 MCG/ML in D5W: INTRA_ARTERIAL | Qty: 10 | Status: AC

## 2017-10-16 NOTE — Telephone Encounter (Signed)
Patient states she was in hospital for cardiac procedure but her legs are still swollen and not addressed by hospital.

## 2017-10-16 NOTE — Telephone Encounter (Signed)
Please advise 

## 2017-10-16 NOTE — Telephone Encounter (Signed)
Attempted to call pt to offer appointment for tomorrow w/[facility]. Voicemail left to return call if she would like to be evaluated.

## 2017-10-17 ENCOUNTER — Other Ambulatory Visit: Payer: Self-pay

## 2017-10-17 ENCOUNTER — Encounter (HOSPITAL_COMMUNITY): Payer: Self-pay | Admitting: Emergency Medicine

## 2017-10-17 ENCOUNTER — Inpatient Hospital Stay (HOSPITAL_COMMUNITY)
Admission: EM | Admit: 2017-10-17 | Discharge: 2017-10-21 | DRG: 919 | Disposition: A | Discharge status: Home / Self Care | Payer: Federal, State, Local not specified - PPO | Source: Other Acute Inpatient Hospital | Attending: Internal Medicine | Admitting: Internal Medicine

## 2017-10-17 ENCOUNTER — Emergency Department (HOSPITAL_COMMUNITY): Payer: Federal, State, Local not specified - PPO

## 2017-10-17 DIAGNOSIS — E7800 Pure hypercholesterolemia, unspecified: Secondary | ICD-10-CM | POA: Diagnosis not present

## 2017-10-17 DIAGNOSIS — I110 Hypertensive heart disease with heart failure: Secondary | ICD-10-CM | POA: Diagnosis not present

## 2017-10-17 DIAGNOSIS — R339 Retention of urine, unspecified: Secondary | ICD-10-CM | POA: Diagnosis not present

## 2017-10-17 DIAGNOSIS — E1151 Type 2 diabetes mellitus with diabetic peripheral angiopathy without gangrene: Secondary | ICD-10-CM | POA: Diagnosis not present

## 2017-10-17 DIAGNOSIS — R3912 Poor urinary stream: Secondary | ICD-10-CM | POA: Diagnosis not present

## 2017-10-17 DIAGNOSIS — J449 Chronic obstructive pulmonary disease, unspecified: Secondary | ICD-10-CM | POA: Diagnosis present

## 2017-10-17 DIAGNOSIS — M79604 Pain in right leg: Secondary | ICD-10-CM | POA: Diagnosis not present

## 2017-10-17 DIAGNOSIS — Z6841 Body Mass Index (BMI) 40.0 and over, adult: Secondary | ICD-10-CM | POA: Diagnosis not present

## 2017-10-17 DIAGNOSIS — Z79890 Hormone replacement therapy (postmenopausal): Secondary | ICD-10-CM

## 2017-10-17 DIAGNOSIS — I729 Aneurysm of unspecified site: Secondary | ICD-10-CM | POA: Diagnosis present

## 2017-10-17 DIAGNOSIS — X58XXXD Exposure to other specified factors, subsequent encounter: Secondary | ICD-10-CM | POA: Diagnosis not present

## 2017-10-17 DIAGNOSIS — I5043 Acute on chronic combined systolic (congestive) and diastolic (congestive) heart failure: Secondary | ICD-10-CM | POA: Diagnosis not present

## 2017-10-17 DIAGNOSIS — Z9981 Dependence on supplemental oxygen: Secondary | ICD-10-CM

## 2017-10-17 DIAGNOSIS — E2749 Other adrenocortical insufficiency: Secondary | ICD-10-CM | POA: Diagnosis not present

## 2017-10-17 DIAGNOSIS — E8770 Fluid overload, unspecified: Secondary | ICD-10-CM | POA: Diagnosis not present

## 2017-10-17 DIAGNOSIS — Z8679 Personal history of other diseases of the circulatory system: Secondary | ICD-10-CM | POA: Diagnosis not present

## 2017-10-17 DIAGNOSIS — E119 Type 2 diabetes mellitus without complications: Secondary | ICD-10-CM

## 2017-10-17 DIAGNOSIS — E2740 Unspecified adrenocortical insufficiency: Secondary | ICD-10-CM | POA: Diagnosis not present

## 2017-10-17 DIAGNOSIS — G4733 Obstructive sleep apnea (adult) (pediatric): Secondary | ICD-10-CM | POA: Diagnosis not present

## 2017-10-17 DIAGNOSIS — I959 Hypotension, unspecified: Secondary | ICD-10-CM | POA: Diagnosis present

## 2017-10-17 DIAGNOSIS — I9789 Other postprocedural complications and disorders of the circulatory system, not elsewhere classified: Secondary | ICD-10-CM | POA: Diagnosis not present

## 2017-10-17 DIAGNOSIS — Z794 Long term (current) use of insulin: Secondary | ICD-10-CM

## 2017-10-17 DIAGNOSIS — Z9119 Patient's noncompliance with other medical treatment and regimen: Secondary | ICD-10-CM

## 2017-10-17 DIAGNOSIS — R0989 Other specified symptoms and signs involving the circulatory and respiratory systems: Secondary | ICD-10-CM | POA: Diagnosis not present

## 2017-10-17 DIAGNOSIS — I2510 Atherosclerotic heart disease of native coronary artery without angina pectoris: Secondary | ICD-10-CM | POA: Diagnosis present

## 2017-10-17 DIAGNOSIS — Z9114 Patient's other noncompliance with medication regimen: Secondary | ICD-10-CM

## 2017-10-17 DIAGNOSIS — I97610 Postprocedural hemorrhage and hematoma of a circulatory system organ or structure following a cardiac catheterization: Secondary | ICD-10-CM | POA: Diagnosis not present

## 2017-10-17 DIAGNOSIS — I130 Hypertensive heart and chronic kidney disease with heart failure and stage 1 through stage 4 chronic kidney disease, or unspecified chronic kidney disease: Secondary | ICD-10-CM | POA: Diagnosis not present

## 2017-10-17 DIAGNOSIS — E873 Alkalosis: Secondary | ICD-10-CM | POA: Diagnosis not present

## 2017-10-17 DIAGNOSIS — E11649 Type 2 diabetes mellitus with hypoglycemia without coma: Secondary | ICD-10-CM | POA: Diagnosis present

## 2017-10-17 DIAGNOSIS — R58 Hemorrhage, not elsewhere classified: Secondary | ICD-10-CM | POA: Diagnosis not present

## 2017-10-17 DIAGNOSIS — Z955 Presence of coronary angioplasty implant and graft: Secondary | ICD-10-CM

## 2017-10-17 DIAGNOSIS — E0590 Thyrotoxicosis, unspecified without thyrotoxic crisis or storm: Secondary | ICD-10-CM | POA: Diagnosis not present

## 2017-10-17 DIAGNOSIS — Z7951 Long term (current) use of inhaled steroids: Secondary | ICD-10-CM

## 2017-10-17 DIAGNOSIS — S31603D Unspecified open wound of abdominal wall, right lower quadrant with penetration into peritoneal cavity, subsequent encounter: Secondary | ICD-10-CM | POA: Diagnosis not present

## 2017-10-17 DIAGNOSIS — I97638 Postprocedural hematoma of a circulatory system organ or structure following other circulatory system procedure: Secondary | ICD-10-CM | POA: Diagnosis not present

## 2017-10-17 DIAGNOSIS — N182 Chronic kidney disease, stage 2 (mild): Secondary | ICD-10-CM | POA: Diagnosis not present

## 2017-10-17 DIAGNOSIS — R52 Pain, unspecified: Secondary | ICD-10-CM | POA: Diagnosis not present

## 2017-10-17 DIAGNOSIS — Z9889 Other specified postprocedural states: Secondary | ICD-10-CM

## 2017-10-17 DIAGNOSIS — Z79899 Other long term (current) drug therapy: Secondary | ICD-10-CM

## 2017-10-17 DIAGNOSIS — I771 Stricture of artery: Secondary | ICD-10-CM | POA: Diagnosis not present

## 2017-10-17 DIAGNOSIS — R0602 Shortness of breath: Secondary | ICD-10-CM | POA: Diagnosis not present

## 2017-10-17 DIAGNOSIS — R34 Anuria and oliguria: Secondary | ICD-10-CM | POA: Diagnosis present

## 2017-10-17 DIAGNOSIS — F17210 Nicotine dependence, cigarettes, uncomplicated: Secondary | ICD-10-CM | POA: Diagnosis present

## 2017-10-17 DIAGNOSIS — D62 Acute posthemorrhagic anemia: Secondary | ICD-10-CM | POA: Diagnosis not present

## 2017-10-17 DIAGNOSIS — G8929 Other chronic pain: Secondary | ICD-10-CM | POA: Diagnosis present

## 2017-10-17 DIAGNOSIS — E1122 Type 2 diabetes mellitus with diabetic chronic kidney disease: Secondary | ICD-10-CM | POA: Diagnosis not present

## 2017-10-17 DIAGNOSIS — I4581 Long QT syndrome: Secondary | ICD-10-CM | POA: Diagnosis not present

## 2017-10-17 DIAGNOSIS — Z7902 Long term (current) use of antithrombotics/antiplatelets: Secondary | ICD-10-CM

## 2017-10-17 DIAGNOSIS — I97630 Postprocedural hematoma of a circulatory system organ or structure following a cardiac catheterization: Secondary | ICD-10-CM

## 2017-10-17 DIAGNOSIS — I255 Ischemic cardiomyopathy: Secondary | ICD-10-CM | POA: Diagnosis not present

## 2017-10-17 DIAGNOSIS — I5030 Unspecified diastolic (congestive) heart failure: Secondary | ICD-10-CM | POA: Diagnosis not present

## 2017-10-17 DIAGNOSIS — I724 Aneurysm of artery of lower extremity: Secondary | ICD-10-CM | POA: Diagnosis not present

## 2017-10-17 DIAGNOSIS — E039 Hypothyroidism, unspecified: Secondary | ICD-10-CM | POA: Diagnosis not present

## 2017-10-17 DIAGNOSIS — Y840 Cardiac catheterization as the cause of abnormal reaction of the patient, or of later complication, without mention of misadventure at the time of the procedure: Secondary | ICD-10-CM | POA: Diagnosis not present

## 2017-10-17 DIAGNOSIS — E876 Hypokalemia: Secondary | ICD-10-CM | POA: Diagnosis present

## 2017-10-17 DIAGNOSIS — S36892D Contusion of other intra-abdominal organs, subsequent encounter: Secondary | ICD-10-CM | POA: Diagnosis not present

## 2017-10-17 DIAGNOSIS — I5033 Acute on chronic diastolic (congestive) heart failure: Secondary | ICD-10-CM | POA: Diagnosis not present

## 2017-10-17 DIAGNOSIS — T81718A Complication of other artery following a procedure, not elsewhere classified, initial encounter: Secondary | ICD-10-CM

## 2017-10-17 DIAGNOSIS — Z888 Allergy status to other drugs, medicaments and biological substances status: Secondary | ICD-10-CM

## 2017-10-17 DIAGNOSIS — I252 Old myocardial infarction: Secondary | ICD-10-CM | POA: Diagnosis not present

## 2017-10-17 DIAGNOSIS — M549 Dorsalgia, unspecified: Secondary | ICD-10-CM

## 2017-10-17 DIAGNOSIS — Z7289 Other problems related to lifestyle: Secondary | ICD-10-CM

## 2017-10-17 DIAGNOSIS — R1030 Lower abdominal pain, unspecified: Secondary | ICD-10-CM | POA: Diagnosis not present

## 2017-10-17 DIAGNOSIS — I509 Heart failure, unspecified: Secondary | ICD-10-CM | POA: Diagnosis not present

## 2017-10-17 DIAGNOSIS — Z91048 Other nonmedicinal substance allergy status: Secondary | ICD-10-CM

## 2017-10-17 DIAGNOSIS — I259 Chronic ischemic heart disease, unspecified: Secondary | ICD-10-CM | POA: Diagnosis not present

## 2017-10-17 DIAGNOSIS — I5042 Chronic combined systolic (congestive) and diastolic (congestive) heart failure: Secondary | ICD-10-CM | POA: Diagnosis not present

## 2017-10-17 DIAGNOSIS — E890 Postprocedural hypothyroidism: Secondary | ICD-10-CM

## 2017-10-17 DIAGNOSIS — E063 Autoimmune thyroiditis: Secondary | ICD-10-CM | POA: Diagnosis present

## 2017-10-17 DIAGNOSIS — F419 Anxiety disorder, unspecified: Secondary | ICD-10-CM | POA: Diagnosis present

## 2017-10-17 DIAGNOSIS — S301XXD Contusion of abdominal wall, subsequent encounter: Secondary | ICD-10-CM | POA: Diagnosis not present

## 2017-10-17 DIAGNOSIS — Z7982 Long term (current) use of aspirin: Secondary | ICD-10-CM

## 2017-10-17 DIAGNOSIS — S8991XA Unspecified injury of right lower leg, initial encounter: Secondary | ICD-10-CM | POA: Diagnosis not present

## 2017-10-17 DIAGNOSIS — R42 Dizziness and giddiness: Secondary | ICD-10-CM | POA: Diagnosis not present

## 2017-10-17 DIAGNOSIS — S301XXA Contusion of abdominal wall, initial encounter: Secondary | ICD-10-CM | POA: Diagnosis not present

## 2017-10-17 LAB — CBC WITH DIFFERENTIAL/PLATELET
Abs Immature Granulocytes: 0.2 10*3/uL — ABNORMAL HIGH (ref 0.0–0.1)
Basophils Absolute: 0.1 10*3/uL (ref 0.0–0.1)
Basophils Relative: 1 %
Eosinophils Absolute: 0.2 10*3/uL (ref 0.0–0.7)
Eosinophils Relative: 2 %
HEMATOCRIT: 26.1 % — AB (ref 36.0–46.0)
Hemoglobin: 8 g/dL — ABNORMAL LOW (ref 12.0–15.0)
Immature Granulocytes: 2 %
LYMPHS ABS: 2.3 10*3/uL (ref 0.7–4.0)
Lymphocytes Relative: 21 %
MCH: 30.8 pg (ref 26.0–34.0)
MCHC: 30.7 g/dL (ref 30.0–36.0)
MCV: 100.4 fL — AB (ref 78.0–100.0)
Monocytes Absolute: 0.7 10*3/uL (ref 0.1–1.0)
Monocytes Relative: 7 %
Neutro Abs: 7.3 10*3/uL (ref 1.7–7.7)
Neutrophils Relative %: 67 %
Platelets: 202 10*3/uL (ref 150–400)
RBC: 2.6 MIL/uL — AB (ref 3.87–5.11)
RDW: 16 % — AB (ref 11.5–15.5)
WBC: 10.7 10*3/uL — AB (ref 4.0–10.5)

## 2017-10-17 LAB — I-STAT CHEM 8, ED
BUN: 18 mg/dL (ref 8–23)
CHLORIDE: 101 mmol/L (ref 98–111)
Calcium, Ion: 1.19 mmol/L (ref 1.15–1.40)
Creatinine, Ser: 1.1 mg/dL — ABNORMAL HIGH (ref 0.44–1.00)
Glucose, Bld: 120 mg/dL — ABNORMAL HIGH (ref 70–99)
HCT: 29 % — ABNORMAL LOW (ref 36.0–46.0)
Hemoglobin: 9.9 g/dL — ABNORMAL LOW (ref 12.0–15.0)
Potassium: 3.8 mmol/L (ref 3.5–5.1)
Sodium: 141 mmol/L (ref 135–145)
TCO2: 34 mmol/L — ABNORMAL HIGH (ref 22–32)

## 2017-10-17 LAB — GLUCOSE, CAPILLARY: Glucose-Capillary: 89 mg/dL (ref 70–99)

## 2017-10-17 MED ORDER — CARVEDILOL 3.125 MG PO TABS
3.1250 mg | ORAL_TABLET | Freq: Two times a day (BID) | ORAL | Status: DC
  Administered 2017-10-17: 3.125 mg via ORAL
  Filled 2017-10-17: qty 1

## 2017-10-17 MED ORDER — CLOPIDOGREL BISULFATE 75 MG PO TABS
75.0000 mg | ORAL_TABLET | Freq: Every day | ORAL | Status: DC
  Administered 2017-10-18 – 2017-10-21 (×4): 75 mg via ORAL
  Filled 2017-10-17 (×4): qty 1

## 2017-10-17 MED ORDER — ATORVASTATIN CALCIUM 80 MG PO TABS
80.0000 mg | ORAL_TABLET | Freq: Every day | ORAL | Status: DC
  Administered 2017-10-17 – 2017-10-20 (×4): 80 mg via ORAL
  Filled 2017-10-17 (×4): qty 1

## 2017-10-17 MED ORDER — LEVOTHYROXINE SODIUM 75 MCG PO TABS
150.0000 ug | ORAL_TABLET | Freq: Every day | ORAL | Status: DC
  Administered 2017-10-18 – 2017-10-21 (×4): 150 ug via ORAL
  Filled 2017-10-17 (×4): qty 2

## 2017-10-17 MED ORDER — HYDROCORTISONE 5 MG PO TABS: 15.0000 mg | ORAL_TABLET | Freq: Every day | ORAL | Status: DC

## 2017-10-17 MED ORDER — INSULIN ASPART 100 UNIT/ML ~~LOC~~ SOLN
0.0000 [IU] | Freq: Every day | SUBCUTANEOUS | Status: DC
  Administered 2017-10-18 – 2017-10-20 (×2): 2 [IU] via SUBCUTANEOUS

## 2017-10-17 MED ORDER — INSULIN ASPART 100 UNIT/ML ~~LOC~~ SOLN: 4.0000 [IU] | Freq: Three times a day (TID) | SUBCUTANEOUS | Status: DC

## 2017-10-17 MED ORDER — FENTANYL CITRATE (PF) 100 MCG/2ML IJ SOLN: 50.0000 ug | Freq: Once | INTRAMUSCULAR | Status: DC

## 2017-10-17 MED ORDER — SACUBITRIL-VALSARTAN 24-26 MG PO TABS
1.0000 | ORAL_TABLET | Freq: Two times a day (BID) | ORAL | Status: DC
  Administered 2017-10-17: 1 via ORAL
  Filled 2017-10-17: qty 1

## 2017-10-17 MED ORDER — ASPIRIN 81 MG PO CHEW
81.0000 mg | CHEWABLE_TABLET | Freq: Every day | ORAL | Status: DC
  Administered 2017-10-18 – 2017-10-21 (×4): 81 mg via ORAL
  Filled 2017-10-17 (×4): qty 1

## 2017-10-17 MED ORDER — FLUTICASONE-UMECLIDIN-VILANT 100-62.5-25 MCG/INH IN AEPB: 1.0000 | INHALATION_SPRAY | Freq: Every day | RESPIRATORY_TRACT | Status: DC

## 2017-10-17 MED ORDER — FLUTICASONE FUROATE-VILANTEROL 100-25 MCG/INH IN AEPB
1.0000 | INHALATION_SPRAY | Freq: Every day | RESPIRATORY_TRACT | Status: DC
  Administered 2017-10-18 – 2017-10-21 (×4): 1 via RESPIRATORY_TRACT
  Filled 2017-10-17: qty 28

## 2017-10-17 MED ORDER — ACETAMINOPHEN 500 MG PO TABS
1000.0000 mg | ORAL_TABLET | Freq: Four times a day (QID) | ORAL | Status: DC | PRN
  Administered 2017-10-17 – 2017-10-18 (×2): 1000 mg via ORAL
  Filled 2017-10-17 (×2): qty 2

## 2017-10-17 MED ORDER — IPRATROPIUM-ALBUTEROL 0.5-2.5 (3) MG/3ML IN SOLN: 3.0000 mL | Freq: Four times a day (QID) | RESPIRATORY_TRACT | Status: DC | PRN

## 2017-10-17 MED ORDER — FENTANYL CITRATE (PF) 100 MCG/2ML IJ SOLN
INTRAMUSCULAR | Status: AC
  Filled 2017-10-17: qty 2

## 2017-10-17 MED ORDER — ONDANSETRON HCL 4 MG/2ML IJ SOLN
4.0000 mg | Freq: Once | INTRAMUSCULAR | Status: DC
  Filled 2017-10-17: qty 2

## 2017-10-17 MED ORDER — MIDAZOLAM HCL 2 MG/2ML IJ SOLN
INTRAMUSCULAR | Status: AC
  Filled 2017-10-17: qty 2

## 2017-10-17 MED ORDER — INSULIN ASPART 100 UNIT/ML ~~LOC~~ SOLN: 5.0000 [IU] | Freq: Three times a day (TID) | SUBCUTANEOUS | Status: DC

## 2017-10-17 MED ORDER — INSULIN GLARGINE 100 UNIT/ML ~~LOC~~ SOLN
10.0000 [IU] | Freq: Every day | SUBCUTANEOUS | Status: DC
  Administered 2017-10-18 – 2017-10-20 (×3): 10 [IU] via SUBCUTANEOUS
  Filled 2017-10-17 (×5): qty 0.1

## 2017-10-17 MED ORDER — UMECLIDINIUM BROMIDE 62.5 MCG/INH IN AEPB
1.0000 | INHALATION_SPRAY | Freq: Every day | RESPIRATORY_TRACT | Status: DC
  Administered 2017-10-18 – 2017-10-21 (×4): 1 via RESPIRATORY_TRACT
  Filled 2017-10-17: qty 7

## 2017-10-17 MED ORDER — FLUDROCORTISONE ACETATE 0.1 MG PO TABS
0.1000 mg | ORAL_TABLET | Freq: Two times a day (BID) | ORAL | Status: DC
  Administered 2017-10-17: 0.1 mg via ORAL
  Filled 2017-10-17: qty 1

## 2017-10-17 MED ORDER — KETAMINE HCL 50 MG/5ML IJ SOSY
1.0000 mg/kg | PREFILLED_SYRINGE | Freq: Once | INTRAMUSCULAR | Status: AC
  Administered 2017-10-17: 100 mg via INTRAVENOUS
  Filled 2017-10-17: qty 15

## 2017-10-17 MED ORDER — DULOXETINE HCL 60 MG PO CPEP
60.0000 mg | ORAL_CAPSULE | Freq: Every day | ORAL | Status: DC
  Administered 2017-10-18 – 2017-10-21 (×4): 60 mg via ORAL
  Filled 2017-10-17 (×4): qty 1

## 2017-10-17 MED ORDER — INSULIN ASPART 100 UNIT/ML ~~LOC~~ SOLN
0.0000 [IU] | Freq: Three times a day (TID) | SUBCUTANEOUS | Status: DC
  Administered 2017-10-18: 8 [IU] via SUBCUTANEOUS
  Administered 2017-10-19 (×2): 5 [IU] via SUBCUTANEOUS
  Administered 2017-10-19: 2 [IU] via SUBCUTANEOUS
  Administered 2017-10-20: 5 [IU] via SUBCUTANEOUS
  Administered 2017-10-20: 3 [IU] via SUBCUTANEOUS
  Administered 2017-10-20: 15 [IU] via SUBCUTANEOUS
  Administered 2017-10-21: 2 [IU] via SUBCUTANEOUS
  Administered 2017-10-21: 8 [IU] via SUBCUTANEOUS

## 2017-10-17 MED ORDER — FUROSEMIDE 10 MG/ML IJ SOLN
80.0000 mg | Freq: Once | INTRAMUSCULAR | Status: AC
  Administered 2017-10-17: 80 mg via INTRAVENOUS
  Filled 2017-10-17: qty 8

## 2017-10-17 MED ORDER — HYDROCORTISONE 5 MG PO TABS: 5.0000 mg | ORAL_TABLET | Freq: Every day | ORAL | Status: DC

## 2017-10-17 MED ORDER — ISOSORBIDE MONONITRATE ER 60 MG PO TB24: 60.0000 mg | ORAL_TABLET | Freq: Every day | ORAL | Status: DC

## 2017-10-17 MED ORDER — FUROSEMIDE 80 MG PO TABS: 80.0000 mg | ORAL_TABLET | Freq: Two times a day (BID) | ORAL | Status: DC

## 2017-10-17 MED ORDER — INSULIN GLARGINE 100 UNIT/ML ~~LOC~~ SOLN
5.0000 [IU] | Freq: Every day | SUBCUTANEOUS | Status: DC
  Filled 2017-10-17: qty 0.05

## 2017-10-17 NOTE — ED Provider Notes (Signed)
[facility] EMERGENCY DEPARTMENT Provider Note   CSN: [id number] Arrival date & time: 10/17/17  1730     History   Chief Complaint Chief Complaint  Patient presents with  . Coagulation Disorder  . Abdominal Pain    HPI Crystal Yates is a 62 y.o. female.  62 yo F with a chief complaint of right sided groin pain.  Patient recently had a cardiac catheterization about 5 days ago there they went through the groin.  She had angioplasty done.  Since then she has had no significant issue they are though today when she was in the bathroom she stood up and turned and felt a pop to the right groin.  Had rapid swelling and pain to the area.  Went to a outside emergency department and was transferred here for evaluation by vascular surgery.  Patient has some mild tingling to her foot though she thinks this is due to someone sitting on her abdomen through the transport here.  She is also having some pain and swelling to the site.  Denies any worsening in transfer.  The history is provided by the patient.  Illness  This is a new problem. The current episode started 1 to 2 hours ago. The problem occurs constantly. The problem has been rapidly worsening. Associated symptoms include abdominal pain. Pertinent negatives include no chest pain, no headaches and no shortness of breath. Nothing aggravates the symptoms. Nothing relieves the symptoms. She has tried nothing for the symptoms. The treatment provided no relief.    Past Medical History:  Diagnosis Date  . Acute on chronic systolic heart failure exacerbation(HCC) 04/08/2016  . CAD in native artery    a. Prior LAD stenting based on cath. b. RCA stenting 03/2016 x2.  . Chronic combined systolic and diastolic CHF (congestive heart failure) ([place])   . CKD (chronic kidney disease), stage II   . COPD (chronic obstructive pulmonary disease) ([place])   . Diabetes mellitus without complication ([place])   . Hashimoto's thyroiditis   .  Hyperlipidemia   . Hypertension   . Myocardial infarction ([place])   . Secondary adrenal insufficiency ([place])   . Thyroid disease   . Tobacco abuse     Patient Active Problem List   Diagnosis Date Noted  . Pseudoaneurysm ([place]) 10/17/2017  . S/P angioplasty for ISR of dRCA 10/13/17 10/14/2017  . CAD (coronary artery disease) 10/13/2017  . Preop examination 10/10/2017  . Acute respiratory distress 09/16/2017  . Right upper lobe pneumonia ([place]) 05/06/2017  . Pneumonia due to respiratory syncytial virus (RSV) 04/21/2017  . Entrapment, radial nerve, right 03/31/2017  . COPD exacerbation ([place]) 10/14/2016  . Chronic combined systolic and diastolic CHF (congestive heart failure) ([place]) 10/13/2016  . Chest tightness or pressure 10/12/2016  . Dyspnea on exertion 09/29/2016  . History of coronary artery disease 09/29/2016  . OAB (overactive bladder) 09/21/2016  . Neck pain 09/08/2016  . Paresthesia of arm 09/08/2016  . Flank pain 08/31/2016  . Oxygen dependent 06/20/2016  . Neuralgia of right upper extremity 04/26/2016  . Coronary artery disease 04/16/2016  . Lymphocele after surgical procedure 04/16/2016  . Ventricular tachycardia (paroxysmal) ([place]) 04/09/2016  . Acute on chronic systolic heart failure exacerbation(HCC) 04/08/2016  . Ischemic cardiomyopathy 04/08/2016  . Type 2 diabetes mellitus without complication, with long-term current use of insulin ([place]) 04/08/2016  . Anemia, blood loss 09/16/2015  . GI bleeding 09/16/2015  . Precordial pain 09/14/2015  . Chest pain 05/22/2015  . Hypokalemia 05/22/2015  .  Hypothyroid 05/22/2015  . Status post hernia repair 01/29/2015  . Surgical wound breakdown 01/29/2015  . Hernia with strangulation 01/18/2015  . Hyperkalemia 01/18/2015  . Tobacco abuse 01/18/2015  . Recurrent incisional hernia with incarceration 01/15/2015  . Sleep apnea 01/15/2015  . S/P drug eluting coronary stent placement 10/16/2014  . Adrenal insufficiency ([place])  11/21/2013  . Anemia 11/21/2013  . Unstable angina ([place]) 11/21/2013  . Atherosclerotic heart disease of native coronary artery without angina pectoris 11/21/2013  . Cardiomyopathy ([place]) 11/21/2013  . Congestive heart failure ([place]) 11/21/2013  . Diabetes mellitus with no complication ([place]) 93/26/7124  . Edema 11/21/2013  . Empty sella syndrome ([place]) 11/21/2013  . Hepatitis 11/21/2013  . Hyperlipemia 11/21/2013  . Hypertension 11/21/2013  . Kidney cyst, acquired 11/21/2013  . Lymph nodes enlarged 11/21/2013  . Fat necrosis 11/21/2013  . Other emphysema ([place]) 11/21/2013  . Renal disorder 11/21/2013  . Shortness of breath 11/21/2013  . Sjogrens syndrome ([place]) 11/21/2013  . Thyroid disorder 11/21/2013  . Nicotine dependence, uncomplicated 58/11/9831  . Major depressive disorder, recurrent episode ([place]) 09/17/2013  . Displacement of lumbar intervertebral disc 06/14/2013  . Intractable migraine without aura 06/14/2013  . Mild cognitive impairment 06/14/2013  . Polyneuropathy in diabetes ([place]) 06/14/2013  . Adrenal cortex insufficiency ([place]) 04/12/2011  . DDD (degenerative disc disease), lumbosacral 01/11/2008  . Thoracic or lumbosacral neuritis or radiculitis 01/11/2008    Past Surgical History:  Procedure Laterality Date  . ABDOMINAL SURGERY    . CESAREAN SECTION    . CHOLECYSTECTOMY    . COLON RESECTION    . CORONARY BALLOON ANGIOPLASTY N/A 10/13/2017   Procedure: CORONARY BALLOON ANGIOPLASTY;  Surgeon: Belva Crome, MD;  Location: [facility];  Service: Cardiovascular;  Laterality: N/A;  . HERNIA MESH REMOVAL    . HERNIA REPAIR    . LEFT HEART CATH AND CORONARY ANGIOGRAPHY N/A 10/13/2016   Procedure: Left Heart Cath and Coronary Angiography;  Surgeon: Nelva Bush, MD;  Location: [facility];  Service: Cardiovascular;  Laterality: N/A;  . RIGHT/LEFT HEART CATH AND CORONARY ANGIOGRAPHY N/A 10/13/2017   Procedure: RIGHT/LEFT HEART CATH AND CORONARY ANGIOGRAPHY;   Surgeon: Belva Crome, MD;  Location: [facility];  Service: Cardiovascular;  Laterality: N/A;  . SHOULDER ARTHROSCOPY    . TUBAL LIGATION       OB History   None      Home Medications    Prior to Admission medications   Medication Sig Start Date End Date Taking? Authorizing Provider  acetaminophen (TYLENOL) 325 MG tablet Take 2 tablets (650 mg total) by mouth every 4 (four) hours as needed for headache or mild pain. 10/14/17  Yes Isaiah Serge, NP  aspirin 81 MG chewable tablet Chew 1 tablet (81 mg total) by mouth daily. 10/14/17  Yes Isaiah Serge, NP  atorvastatin (LIPITOR) 80 MG tablet Take 1 tablet (80 mg total) by mouth daily at 6 PM. 10/14/17  Yes Isaiah Serge, NP  CALCIUM PO Take 1 tablet by mouth daily.   Yes [provider]  carvedilol (COREG) 3.125 MG tablet Take 3.125 mg by mouth 2 (two) times daily with a meal.   Yes [provider]  Cholecalciferol (VITAMIN D3) 2000 units capsule Take 2,000 Units by mouth daily.    Yes [provider]  clopidogrel (PLAVIX) 75 MG tablet Take 75 mg by mouth daily.   Yes [provider]  doxycycline (VIBRAMYCIN) 100 MG capsule Take 100 mg  by mouth 2 (two) times daily.  10/09/17  Yes [provider]  DULoxetine (CYMBALTA) 60 MG capsule Take 60 mg by mouth once a day 12/05/16  Yes [provider]  EMGALITY 120 MG/ML SOAJ Inject 120 mg into the skin every 30 (thirty) days. 07/30/17  Yes [provider]  fludrocortisone (FLORINEF) 0.1 MG tablet Take 0.1 mg by mouth 2 (two) times daily.   Yes [provider]  furosemide (LASIX) 80 MG tablet Take 80 mg (2 tablets) twice daily. Patient taking differently: Take 40 mg by mouth 2 (two) times daily.  09/15/17  Yes Park Liter, MD  hydrocortisone (CORTEF) 5 MG tablet Take 5-15 mg by mouth See admin instructions. Take 15 mg by mouth in the morning and 5 mg in the afternoon   Yes [provider]  Insulin Aspart,  w/Niacinamide, (FIASP FLEXTOUCH) 100 UNIT/ML SOPN Inject 2-14 Units into the skin 3 (three) times daily before meals. Per sliding scale   Yes [provider]  insulin degludec (TRESIBA FLEXTOUCH) 100 UNIT/ML SOPN FlexTouch Pen Inject 40 Units into the skin at bedtime.  08/28/16  Yes [provider]  Iron-FA-B Cmp-C-Biot-Probiotic (FUSION PLUS) CAPS Take 1 capsule by mouth daily. 10/09/16  Yes [provider]  isosorbide mononitrate (IMDUR) 60 MG 24 hr tablet Take 1 tablet (60 mg total) by mouth daily. 03/10/17  Yes Park Liter, MD  levothyroxine (SYNTHROID, LEVOTHROID) 150 MCG tablet Take 150 mcg by mouth daily before breakfast.   Yes [provider]  Magnesium 250 MG TABS Take 250 mg by mouth daily.   Yes [provider]  Multiple Vitamins-Minerals (EMERGEN-C IMMUNE PO) Take 1 capsule by mouth daily. CHEW   Yes [provider]  nitroGLYCERIN (NITROSTAT) 0.4 MG SL tablet Place 0.4 mg under the tongue every 5 (five) minutes as needed for chest pain.    Yes [provider]  oxybutynin (DITROPAN XL) 15 MG 24 hr tablet Take 15 mg by mouth at bedtime.   Yes [provider]  potassium chloride SA (K-DUR,KLOR-CON) 20 MEQ tablet Take 1 tablet (20 mEq total) by mouth daily. 03/14/17  Yes Park Liter, MD  pramipexole (MIRAPEX) 1 MG tablet Take 1 mg by mouth 2 (two) times daily.   Yes [provider]  sacubitril-valsartan (ENTRESTO) 24-26 MG Take 1 tablet by mouth 2 (two) times daily. 10/20/16  Yes Park Liter, MD  Semaglutide (OZEMPIC) 0.25 or 0.5 MG/DOSE SOPN Inject 0.5 mg into the skin every 30 (thirty) days.    Yes [provider]  TRELEGY ELLIPTA 100-62.5-25 MCG/INH AEPB Inhale 1 puff into the lungs daily. 08/30/17  Yes [provider]  TRULICITY 1.5 OZ/3.0QM SOPN Inject 1.5 mg into the skin every Sunday.  10/10/16  Yes [provider]    Family History Family History  Problem  Relation Age of Onset  . Stroke Mother   . Diabetes Father   . Diabetes Sister   . Diabetes Sister   . Diabetes Son     Social History Social History   Tobacco Use  . Smoking status: Current Every Day Smoker    Packs/day: 0.75    Years: 40.00    Pack years: 30.00    Types: Cigarettes  . Smokeless tobacco: Never Used  Substance Use Topics  . Alcohol use: Yes    Comment: 1 drink every day  . Drug use: Yes    Types: Marijuana    Comment: occ  Allergies   Hydroxychloroquine; Donepezil; Prednisone; Bupropion; Metrizamide; Varenicline; and Tape   Review of Systems Review of Systems  Constitutional: Negative for chills and fever.  HENT: Negative for congestion and rhinorrhea.   Eyes: Negative for redness and visual disturbance.  Respiratory: Negative for shortness of breath and wheezing.   Cardiovascular: Negative for chest pain and palpitations.  Gastrointestinal: Positive for abdominal pain. Negative for nausea and vomiting.  Genitourinary: Negative for dysuria and urgency.  Musculoskeletal: Negative for arthralgias and myalgias.  Skin: Negative for pallor and wound.  Neurological: Negative for dizziness and headaches.     Physical Exam Updated Vital Signs BP 112/79   Pulse 76   Resp (!) 21   Wt 104.3 kg (230 lb)   SpO2 100%   BMI 42.07 kg/m   Physical Exam  Constitutional: She is oriented to person, place, and time. She appears well-developed and well-nourished. No distress.  HENT:  Head: Normocephalic and atraumatic.  Eyes: Pupils are equal, round, and reactive to light. EOM are normal.  Neck: Normal range of motion. Neck supple.  Cardiovascular: Normal rate and regular rhythm. Exam reveals no gallop and no friction rub.  No murmur heard. Pulmonary/Chest: Effort normal. She has no wheezes. She has no rales.  Abdominal: Soft. She exhibits no distension. There is tenderness.  Pain and swelling to the right lower quadrant.  Ecchymosis.  Musculoskeletal:  She exhibits no edema or tenderness.  Neurological: She is alert and oriented to person, place, and time.  Skin: Skin is warm and dry. She is not diaphoretic.  Psychiatric: She has a normal mood and affect. Her behavior is normal.  Nursing note and vitals reviewed.    ED Treatments / Results  Labs (all labs ordered are listed, but only abnormal results are displayed) Labs Reviewed  CBC WITH DIFFERENTIAL/PLATELET - Abnormal; Notable for the following components:      Result Value   WBC 10.7 (*)    RBC 2.60 (*)    Hemoglobin 8.0 (*)    HCT 26.1 (*)    MCV 100.4 (*)    RDW 16.0 (*)    Abs Immature Granulocytes 0.2 (*)    All other components within normal limits  I-STAT CHEM 8, ED - Abnormal; Notable for the following components:   Creatinine, Ser 1.10 (*)    Glucose, Bld 120 (*)    TCO2 34 (*)    Hemoglobin 9.9 (*)    HCT 29.0 (*)    All other components within normal limits    EKG EKG Interpretation  Date/Time:  Tuesday October 17 2017 17:37:02 EDT Ventricular Rate:  81 PR Interval:    QRS Duration: 97 QT Interval:  416 QTC Calculation: 483 R Axis:   52 Text Interpretation:  Sinus rhythm Probable inferior infarct, old No significant change since last tracing Confirmed by Deno Etienne [phone]) on 10/17/2017 5:49:37 PM   Radiology No results found.  Procedures .Sedation Date/Time: 10/17/2017 6:47 PM Performed by: Deno Etienne, DO Authorized by: Deno Etienne, DO   Consent:    Consent obtained:  Written   Consent given by:  Patient   Risks discussed:  Allergic reaction, prolonged hypoxia resulting in organ damage, prolonged sedation necessitating reversal, respiratory compromise necessitating ventilatory assistance and intubation, inadequate sedation and dysrhythmia Universal protocol:    Immediately prior to procedure a time out was called: yes     Patient identity confirmation method:  Anonymous protocol, patient vented/unresponsive and arm band Indications:    Sedation  is required  to allow for: Pseudoaneurysm compression.   Intended level of sedation:  Moderate (conscious sedation) Pre-sedation assessment:    Time since last food or drink:  12 hours   NPO status caution: unable to specify NPO status     ASA classification: class 2 - patient with mild systemic disease     Neck mobility: normal     Mouth opening:  3 or more finger widths   Thyromental distance:  3 finger widths   Mallampati score:  I - soft palate, uvula, fauces, pillars visible   Pre-sedation assessments completed and reviewed: airway patency, cardiovascular function, hydration status, mental status, nausea/vomiting, pain level, respiratory function and temperature   Immediate pre-procedure details:    Reviewed: vital signs     Verified: bag valve mask available, emergency equipment available, intubation equipment available, IV patency confirmed, oxygen available and suction available   Procedure details (see MAR for exact dosages):    Preoxygenation:  Nasal cannula   Sedation:  Ketamine   Analgesia:  Fentanyl   Intra-procedure monitoring:  Blood pressure monitoring, continuous capnometry, cardiac monitor, frequent LOC assessments and frequent vital sign checks   Intra-procedure events comment:  Patient had 40 second apnea time, resolved spontaneously   Total Provider sedation time (minutes):  40 Post-procedure details:    Post-sedation assessments completed and reviewed: airway patency, cardiovascular function, hydration status, mental status, nausea/vomiting, pain level, respiratory function and temperature     Patient is stable for discharge or admission: yes     Patient tolerance:  Tolerated well, no immediate complications   (including critical care time)  Medications Ordered in ED Medications  fentaNYL (SUBLIMAZE) injection 50 mcg (50 mcg Intravenous Not Given 10/17/17 1853)  midazolam (VERSED) 2 MG/2ML injection (  Not Given 10/17/17 1853)  ondansetron (ZOFRAN) injection 4 mg (4  mg Intravenous Not Given 10/17/17 1922)  ketamine 50 mg in normal saline 5 mL (10 mg/mL) syringe (100 mg Intravenous Given 10/17/17 1823)     Initial Impression / Assessment and Plan / ED Course  I have reviewed the triage vital signs and the nursing notes.  Pertinent labs & imaging results that were available during my care of the patient were reviewed by me and considered in my medical decision making (see chart for details).     63 yo F with a chief complaint of right lower abdominal pain.  The patient recently had a cardiac catheterization done 5 days ago.  She was getting an MRI and went to the bathroom and she felt a pop when she turned to the side.  Had sudden rapid swelling to her right groin and her lower abdomen.  She went to the emergency department there and then was found to have a pseudoaneurysm and transferred here for evaluation by vascular surgery.  Vascular paged upon arrival.  Pseudoaneurysm again reconfirmed on ultrasound.  This was compressed with direct visualization under ultrasound with improvement.  Patient was sedated during this procedure.  Recommended for her medical admission with her chronic medical problems including her worsening bilateral lower extremity swelling.  Will discuss with hospitalist.  Patient had labs done at [facility] hemoglobin was 10.2, white count of 8.8, platelets 207, sodium 136 potassium 4.2 chloride 101 glucose 132 creatinine 0.9 no anion gap.  LFTs unremarkable.  Lipase 91.  Lactate mildly elevated at 2.3.  2 g hemoglobin drop from [place].  BP in the low 100s.  Vascular to reultrasound in the morning.  Cards will see in the morning as well.  CRITICAL CARE Performed by: Cecilio Asper   Total critical care time: 35 minutes  Critical care time was exclusive of separately billable procedures and treating other patients.  Critical care was necessary to treat or prevent imminent or life-threatening deterioration.  Critical care was  time spent personally by me on the following activities: development of treatment plan with patient and/or surrogate as well as nursing, discussions with consultants, evaluation of patient's response to treatment, examination of patient, obtaining history from patient or surrogate, ordering and performing treatments and interventions, ordering and review of laboratory studies, ordering and review of radiographic studies, pulse oximetry and re-evaluation of patient's condition.  The patients results and plan were reviewed and discussed.   Any x-rays performed were independently reviewed by myself.   Differential diagnosis were considered with the presenting HPI.  Medications  fentaNYL (SUBLIMAZE) injection 50 mcg (50 mcg Intravenous Not Given 10/17/17 1853)  midazolam (VERSED) 2 MG/2ML injection (  Not Given 10/17/17 1853)  ondansetron (ZOFRAN) injection 4 mg (4 mg Intravenous Not Given 10/17/17 1922)  ketamine 50 mg in normal saline 5 mL (10 mg/mL) syringe (100 mg Intravenous Given 10/17/17 1823)    Vitals:   10/17/17 1839 10/17/17 1851 10/17/17 1900 10/17/17 1915  BP: 114/63 104/63 103/68 112/79  Pulse: 82 79 79 76  Resp: 13 13 13  (!) 21  SpO2: 99% 100% 100% 100%  Weight:        Final diagnoses:  Pseudoaneurysm following procedure ([place])    Admission/ observation were discussed with the admitting physician, patient and/or family and they are comfortable with the plan.   Final Clinical Impressions(s) / ED Diagnoses   Final diagnoses:  Pseudoaneurysm following procedure [facility])    ED Discharge Orders    None       Deno Etienne, DO 10/17/17 2019

## 2017-10-17 NOTE — Progress Notes (Signed)
*  PRELIMINARY RESULTS* Vascular Ultrasound Limited Right Lower Extremity Arterial Duplex and pseudoaneurysm compression has been completed under the supervision of Dr. Tyrone Nine and Dr. Trula Slade. A pseudoaneurysm was visualized in the right groin, in the area of recent cath site. Manual compression was held for five minutes. After compression, pseudoaneurysm appears to have resolved.   10/17/2017 6:58 PM Maudry Mayhew, BS, RVT, RDCS, RDMS

## 2017-10-17 NOTE — ED Triage Notes (Signed)
PT had a failed cardiac stent ballooned on Friday. Dr. Tamala Julian performed procedure. PT went for an outpatient MRI today for neck pain. PT was changing clothes to prepare for MRI when she turned and felt a "pop" and instant pain in RLQ of abdomen. PT was taken directly to [facility] ER.   ER nurse reports PT had a hard, palpable ball in RLQ.   PT arrives to [facility] ED with 10 lb sandbag overf RLQ. Bruising over entire RLQ. Pressure was held during transport. 10 lb bag was

## 2017-10-17 NOTE — Progress Notes (Signed)
Pt arrived to 4E17 from ED. VSS. CHG bath done. DP pulses dopperable. R groin is soft w/ bruising that is marked, pt w/ 9/10 pain 2/2 to bed transfer, however is now sleeping. Attending has seen. Pt and husband updated on plan of care. Will continue to monitor.  Of note, pt and husband voice that her lower extremity edema is worsening (+3 pitting), and that she has had to use her oxygen more frequently at home and has increased episode of SOB especially at night and w/ activity. Reports reduced diuresis w/ her Lasix pills. Pt discussed w/ MD.   Jaymes Graff, RN

## 2017-10-17 NOTE — Consult Note (Signed)
[facility] [place]  Patient name: Crystal Yates MRN: [id number] DOB: 17-Jul-1955 Sex: female   REQUESTING PROVIDER:    ER   REASON FOR CONSULT:    Right femoral pseudoaneurysm  HISTORY OF PRESENT ILLNESS:   Crystal Yates is a 62 y.o. female, who is recently status post cardiac catheterization via a right femoral approach.  Earlier today she felt a pop in the right groin and had significant rapid swelling in that area.  She went to [facility] emergency department where she was diagnosed with hematoma and pseudoaneurysm.  She was transferred to [facility] for further care.  On arrival she was hemodynamically stable.  She was complaining of pain in her right groin.  On 10/13/2017 the patient underwent cardiac catheterization where she had balloon angioplasty of a distal right coronary in-stent restenosis.  She also had a distal right stent with approximately 75% stenosis.  This was to be medically managed.  She is on dual antiplatelet therapy.  The patient is on insulin for diabetes.  She takes a statin for hypercholesterolemia.  She is medically managed for hypertension.  She is a current smoker.  PAST MEDICAL HISTORY    Past Medical History:  Diagnosis Date  . Acute on chronic systolic heart failure exacerbation(HCC) 04/08/2016  . CAD in native artery    a. Prior LAD stenting based on cath. b. RCA stenting 03/2016 x2.  . Chronic combined systolic and diastolic CHF (congestive heart failure) ([place])   . CKD (chronic kidney disease), stage II   . COPD (chronic obstructive pulmonary disease) ([place])   . Diabetes mellitus without complication ([place])   . Hashimoto's thyroiditis   . Hyperlipidemia   . Hypertension   . Myocardial infarction ([place])   . Secondary adrenal insufficiency ([place])   . Thyroid disease   . Tobacco abuse      FAMILY HISTORY   Family History  Problem Relation Age of Onset  . Stroke Mother     . Diabetes Father   . Diabetes Sister   . Diabetes Sister   . Diabetes Son     SOCIAL HISTORY:   Social History   Socioeconomic History  . Marital status: Married    Spouse name: Not on file  . Number of children: Not on file  . Years of education: Not on file  . Highest education level: Not on file  Occupational History  . Not on file  Social Needs  . Financial resource strain: Not on file  . Food insecurity:    Worry: Not on file    Inability: Not on file  . Transportation needs:    Medical: Not on file    Non-medical: Not on file  Tobacco Use  . Smoking status: Current Every Day Smoker    Packs/day: 0.75    Years: 40.00    Pack years: 30.00    Types: Cigarettes  . Smokeless tobacco: Never Used  Substance and Sexual Activity  . Alcohol use: Yes    Comment: 1 drink every day  . Drug use: Yes    Types: Marijuana    Comment: occ  . Sexual activity: Not on file  Lifestyle  . Physical activity:    Days per week: Not on file    Minutes per session: Not on file  . Stress: Not on file  Relationships  . Social connections:    Talks on phone: Not on file    Gets together: Not on file  Attends religious service: Not on file    Active member of club or organization: Not on file    Attends meetings of clubs or organizations: Not on file    Relationship status: Not on file  . Intimate partner violence:    Fear of current or ex partner: Not on file    Emotionally abused: Not on file    Physically abused: Not on file    Forced sexual activity: Not on file  Other Topics Concern  . Not on file  Social History Narrative  . Not on file    ALLERGIES:    Allergies  Allergen Reactions  . Hydroxychloroquine Shortness Of Breath, Nausea Only and Other (See Comments)    Dizziness (also)  . Donepezil Other (See Comments)    Dizziness, depression, and makes the patient feel "funny"  . Prednisone Other (See Comments)    Causes depression and suicidal thoughts  .  Bupropion Other (See Comments)    Suicidal thoughts  . Metrizamide Other (See Comments)    (a non-ionic radiopaque contrast agent) "Blows the vein" and contrast gathers at the injected site's limb  . Varenicline Other (See Comments)    Suicidal thoughts  . Tape Rash and Other (See Comments)    Paper tape is preferred, PLEASE    CURRENT MEDICATIONS:    Current Facility-Administered Medications  Medication Dose Route Frequency Provider Last Rate Last Dose  . fentaNYL (SUBLIMAZE) injection 50 mcg  50 mcg Intravenous Once Deno Etienne, DO      . midazolam (VERSED) 2 MG/2ML injection           . ondansetron (ZOFRAN) injection 4 mg  4 mg Intravenous Once Deno Etienne, DO       Current Outpatient Medications  Medication Sig Dispense Refill  . acetaminophen (TYLENOL) 325 MG tablet Take 2 tablets (650 mg total) by mouth every 4 (four) hours as needed for headache or mild pain.    Marland Kitchen aspirin 81 MG chewable tablet Chew 1 tablet (81 mg total) by mouth daily.    Marland Kitchen atorvastatin (LIPITOR) 80 MG tablet Take 1 tablet (80 mg total) by mouth daily at 6 PM. 30 tablet 6  . CALCIUM PO Take 1 tablet by mouth daily.    . carvedilol (COREG) 3.125 MG tablet Take 3.125 mg by mouth 2 (two) times daily with a meal.    . Cholecalciferol (VITAMIN D3) 2000 units capsule Take 2,000 Units by mouth daily.     . clopidogrel (PLAVIX) 75 MG tablet Take 75 mg by mouth daily.    Marland Kitchen doxycycline (VIBRAMYCIN) 100 MG capsule Take 100 mg by mouth 2 (two) times daily.     . DULoxetine (CYMBALTA) 60 MG capsule Take 60 mg by mouth once a day  0  . EMGALITY 120 MG/ML SOAJ Inject 120 mg into the skin every 30 (thirty) days.  3  . fludrocortisone (FLORINEF) 0.1 MG tablet Take 0.1 mg by mouth 2 (two) times daily.    . furosemide (LASIX) 80 MG tablet Take 80 mg (2 tablets) twice daily. (Patient taking differently: Take 40 mg by mouth 2 (two) times daily. )    . hydrocortisone (CORTEF) 5 MG tablet Take 5-15 mg by mouth See admin  instructions. Take 15 mg by mouth in the morning and 5 mg in the afternoon    . Insulin Aspart, w/Niacinamide, (FIASP FLEXTOUCH) 100 UNIT/ML SOPN Inject 2-14 Units into the skin 3 (three) times daily before meals. Per sliding scale    .  insulin degludec (TRESIBA FLEXTOUCH) 100 UNIT/ML SOPN FlexTouch Pen Inject 40 Units into the skin at bedtime.     . Iron-FA-B Cmp-C-Biot-Probiotic (FUSION PLUS) CAPS Take 1 capsule by mouth daily.  5  . isosorbide mononitrate (IMDUR) 60 MG 24 hr tablet Take 1 tablet (60 mg total) by mouth daily. 60 tablet 2  . levothyroxine (SYNTHROID, LEVOTHROID) 150 MCG tablet Take 150 mcg by mouth daily before breakfast.    . Magnesium 250 MG TABS Take 250 mg by mouth daily.    . Multiple Vitamins-Minerals (EMERGEN-C IMMUNE PO) Take 1 capsule by mouth daily. CHEW    . nitroGLYCERIN (NITROSTAT) 0.4 MG SL tablet Place 0.4 mg under the tongue every 5 (five) minutes as needed for chest pain.     Marland Kitchen oxybutynin (DITROPAN XL) 15 MG 24 hr tablet Take 15 mg by mouth at bedtime.    . potassium chloride SA (K-DUR,KLOR-CON) 20 MEQ tablet Take 1 tablet (20 mEq total) by mouth daily. 90 tablet 3  . pramipexole (MIRAPEX) 1 MG tablet Take 1 mg by mouth 2 (two) times daily.    . sacubitril-valsartan (ENTRESTO) 24-26 MG Take 1 tablet by mouth 2 (two) times daily. 60 tablet 11  . Semaglutide (OZEMPIC) 0.25 or 0.5 MG/DOSE SOPN Inject 0.5 mg into the skin every 30 (thirty) days.     . TRELEGY ELLIPTA 100-62.5-25 MCG/INH AEPB Inhale 1 puff into the lungs daily.  3  . TRULICITY 1.5 XF/8.1WE SOPN Inject 1.5 mg into the skin every Sunday.   4    REVIEW OF SYSTEMS:   [X]  denotes positive finding, [ ]  denotes negative finding Cardiac  Comments:  Chest pain or chest pressure:    Shortness of breath upon exertion:    Short of breath when lying flat:    Irregular heart rhythm:        Vascular    Pain in calf, thigh, or hip brought on by ambulation:    Pain in feet at night that wakes you up from  your sleep:     Blood clot in your veins:    Leg swelling:  x       Pulmonary    Oxygen at home:    Productive cough:     Wheezing:         Neurologic    Sudden weakness in arms or legs:     Sudden numbness in arms or legs:     Sudden onset of difficulty speaking or slurred speech:    Temporary loss of vision in one eye:     Problems with dizziness:         Gastrointestinal    Blood in stool:      Vomited blood:         Genitourinary    Burning when urinating:     Blood in urine:        Psychiatric    Major depression:         Hematologic    Bleeding problems: x   Problems with blood clotting too easily:        Skin    Rashes or ulcers:        Constitutional    Fever or chills:     PHYSICAL EXAM:   Vitals:   10/17/17 1839 10/17/17 1851 10/17/17 1900 10/17/17 1915  BP: 114/63 104/63 103/68 112/79  Pulse: 82 79 79 76  Resp: 13 13 13  (!) 21  SpO2: 99% 100% 100% 100%  Weight:  GENERAL: The patient is a well-nourished female, in no acute distress. The vital signs are documented above. CARDIAC: There is a regular rate and rhythm.  VASCULAR: Large right femoral hematoma with ecchymosis.  Extremities are warm with Doppler signals and significant edema PULMONARY: Nonlabored respirations ABDOMEN: Tender in right lower quadrant.  MUSCULOSKELETAL: There are no major deformities or cyanosis. NEUROLOGIC: No focal weakness or paresthesias are detected. SKIN: There are no ulcers or rashes noted. PSYCHIATRIC: The patient has a normal affect.  STUDIES:   Duplex ultrasound was ordered which confirmed a small right femoral pseudoaneurysm and a large surrounding hematoma  ASSESSMENT and PLAN   Right femoral pseudoaneurysm following cardiac cath.  The patient was given conscious sedation in the emergency department and ultrasound-guided compression of her pseudoaneurysm was performed.  Post compression ultrasound revealed resolution of the pseudoaneurysm.  I have  recommended admission to the hospital under the care of the hospital service with plans for repeat ultrasound in the morning.  The patient understands that she will be on bedrest overnight.  Further management will depend on how she does overnight, and what her ultrasound reveals in the morning.   Annamarie Major, MD [facility] [phone] Pager [phone]

## 2017-10-17 NOTE — Progress Notes (Signed)
   Courtesy visit.  Had successful compression of pseudoaneurysm.  She is currently feeling well but somewhat sleepy post sedation.  Her husband was present.  Recent cardiac catheterization from Dr. Tamala Julian.  Appreciative of visit. Hematoma noted.  Candee Furbish, MD

## 2017-10-17 NOTE — H&P (Addendum)
Date: 10/17/2017               Patient Name:  Crystal Yates MRN: [id number]  DOB: [birth year] Age / Sex: 62 y.o., female   PCP: Dortha Kern, PA         Medical Service: Internal Medicine Teaching Service         Attending Physician: Dr. Bartholomew Crews, MD    First Contact: Dr. Rhetta Mura Pager: [phone]  Second Contact: Dr. Jari Favre Pager: [phone]       After Hours (After 5p/  First Contact Pager: [phone]  weekends / holidays): Second Contact Pager: [phone]   Chief Complaint: Right groin pain  History of Present Illness:  This is a 62 year old female with a PMH of DM on insulin, HTN, ischemic cardiomyopathy, chronic combined systolic and diastolic HF(last echo Jan 2019 showed EF of 40%, and , COPD, CAD s/p angioplasty for ISR of dRCA on 7/19 presenting with right groin pain and swelling. She reports that she was at the MRI place where she went to the bathroom. As she was leaving she hit her foot against something and she felt a pop, she developed a sudden right groin pain, swelling, and redness. She did not have any lightheadedness or syncope. She went to [facility] emergency department to be evaluated and had an ultrasound that showed a hematoma and pseudoaneurysm. She was transferred to [facility] to be evaluated by vascular surgery.   She reports worsening SOB and bilateral LE edema. She had DOE and orthopnea. Her weight in June after discharge from heart failure exacerbation was 213, today her weight is 230. She was hospitalized from June 22-24th for an acute on chronic HF exacerbation where she was diuresed for 2 days with improvement in respiratory and volume status. She followed up with cardiologist on 7/10 where she reported not feeling any better and requesting a cardiac catheterization. She went to the ER on 7/17 for dyspnea, fatigue, and a 11lb weight gain with increased O2 requirements. Has had stents in LAD and more recently in the RCA in Jan 2019. On 7/19  she had a cardiac cath and PCI of balloon angioplasty of distal right coronary ISR via the right femoral artery, she had a distal right stent with approximately 75% stenosis, her weight at that time was 228. She was told to take dual antiplatelet therapy, ASA and Plavix, indefinitely.   Labs from [facility] showed a hgb of 10.2, Cr 0.9, and WBC of 8.8, LFTs non elevated, lactic acid of 2.3. Here her labs showed Cr of 1.1, WBC of 10.7, Hgb of 8.0. In the ED, patient was hemodynamically stable. Vascular surgery evaluated and a pseudoaneurysm was confirmed on ultrasound. Patient was sedated and psuedoaneurysm was compressed for 5 minutes with direct visualization via ultrasound and improved. Patient was admitted to internal medicine.    Meds:  Current Meds  Medication Sig  . acetaminophen (TYLENOL) 325 MG tablet Take 2 tablets (650 mg total) by mouth every 4 (four) hours as needed for headache or mild pain.  Marland Kitchen aspirin 81 MG chewable tablet Chew 1 tablet (81 mg total) by mouth daily.  Marland Kitchen atorvastatin (LIPITOR) 80 MG tablet Take 1 tablet (80 mg total) by mouth daily at 6 PM.  . CALCIUM PO Take 1 tablet by mouth daily.  . carvedilol (COREG) 3.125 MG tablet Take 3.125 mg by mouth 2 (two) times daily with a meal.  . Cholecalciferol (VITAMIN D3) 2000 units capsule Take 2,000  Units by mouth daily.   . clopidogrel (PLAVIX) 75 MG tablet Take 75 mg by mouth daily.  Marland Kitchen doxycycline (VIBRAMYCIN) 100 MG capsule Take 100 mg by mouth 2 (two) times daily.   . DULoxetine (CYMBALTA) 60 MG capsule Take 60 mg by mouth once a day  . EMGALITY 120 MG/ML SOAJ Inject 120 mg into the skin every 30 (thirty) days.  . fludrocortisone (FLORINEF) 0.1 MG tablet Take 0.1 mg by mouth 2 (two) times daily.  . furosemide (LASIX) 80 MG tablet Take 80 mg (2 tablets) twice daily. (Patient taking differently: Take 40 mg by mouth 2 (two) times daily. )  . hydrocortisone (CORTEF) 5 MG tablet Take 5-15 mg by mouth See admin instructions.  Take 15 mg by mouth in the morning and 5 mg in the afternoon  . Insulin Aspart, w/Niacinamide, (FIASP FLEXTOUCH) 100 UNIT/ML SOPN Inject 2-14 Units into the skin 3 (three) times daily before meals. Per sliding scale  . insulin degludec (TRESIBA FLEXTOUCH) 100 UNIT/ML SOPN FlexTouch Pen Inject 40 Units into the skin at bedtime.   . Iron-FA-B Cmp-C-Biot-Probiotic (FUSION PLUS) CAPS Take 1 capsule by mouth daily.  . isosorbide mononitrate (IMDUR) 60 MG 24 hr tablet Take 1 tablet (60 mg total) by mouth daily.  Marland Kitchen levothyroxine (SYNTHROID, LEVOTHROID) 150 MCG tablet Take 150 mcg by mouth daily before breakfast.  . Magnesium 250 MG TABS Take 250 mg by mouth daily.  . Multiple Vitamins-Minerals (EMERGEN-C IMMUNE PO) Take 1 capsule by mouth daily. CHEW  . nitroGLYCERIN (NITROSTAT) 0.4 MG SL tablet Place 0.4 mg under the tongue every 5 (five) minutes as needed for chest pain.   Marland Kitchen oxybutynin (DITROPAN XL) 15 MG 24 hr tablet Take 15 mg by mouth at bedtime.  . potassium chloride SA (K-DUR,KLOR-CON) 20 MEQ tablet Take 1 tablet (20 mEq total) by mouth daily.  . pramipexole (MIRAPEX) 1 MG tablet Take 1 mg by mouth 2 (two) times daily.  . sacubitril-valsartan (ENTRESTO) 24-26 MG Take 1 tablet by mouth 2 (two) times daily.  . Semaglutide (OZEMPIC) 0.25 or 0.5 MG/DOSE SOPN Inject 0.5 mg into the skin every 30 (thirty) days.   . TRELEGY ELLIPTA 100-62.5-25 MCG/INH AEPB Inhale 1 puff into the lungs daily.  . TRULICITY 1.5 AY/3.0ZS SOPN Inject 1.5 mg into the skin every Sunday.      Allergies: Allergies as of 10/17/2017 - Review Complete 10/17/2017  Allergen Reaction Noted  . Hydroxychloroquine Shortness Of Breath, Nausea Only, and Other (See Comments) 11/21/2013  . Donepezil Other (See Comments) 10/16/2013  . Prednisone Other (See Comments) 09/02/2013  . Bupropion Other (See Comments) 07/15/2016  . Metrizamide Other (See Comments) 10/05/2016  . Varenicline Other (See Comments) 07/15/2016  . Tape Rash and  Other (See Comments) 10/12/2016   Past Medical History:  Diagnosis Date  . Acute on chronic systolic heart failure exacerbation(HCC) 04/08/2016  . CAD in native artery    a. Prior LAD stenting based on cath. b. RCA stenting 03/2016 x2.  . Chronic combined systolic and diastolic CHF (congestive heart failure) ([place])   . CKD (chronic kidney disease), stage II   . COPD (chronic obstructive pulmonary disease) ([place])   . Diabetes mellitus without complication ([place])   . Hashimoto's thyroiditis   . Hyperlipidemia   . Hypertension   . Myocardial infarction ([place])   . Secondary adrenal insufficiency ([place])   . Thyroid disease   . Tobacco abuse     Family History: Diabetes runs in the family.  Social History: Smokes  3-4 ppd since 18, drinks about 1-2 oz of liquor daily, smoked marijuana once.   Review of Systems: A complete ROS was negative except as per HPI.   Physical Exam: Blood pressure 131/62, pulse 80, temperature 97.9 F (36.6 C), temperature source Oral, resp. rate 17, height 5\' 2"  (1.575 m), weight 231 lb 11.3 oz (105.1 kg), SpO2 100 %. General: Patient was very sleepy, appears stated age and no distress HEENT: PERRLA and extra ocular movement intact Heart: Distant heart sounds, S1, S2 normal, no murmur, rub or gallop, regular rate and rhythm Lungs: clear to auscultation, unlabored breathing and expiratory wheezes in anterior aspect Abdomen: Soft, hematoma with ecchymosis in RLQ with tenderness to palpation, obese abdomen, abdominal surgical scar Extremities: Extremities normal, atraumatic, bilateral LE 2+ pitting edema to knee's Musculoskeletal: no joint tenderness, deformity or swelling Skin:Ecchymosis in RLQ of abdomen Neurology: normal without focal findings, mental status, speech normal, alert and oriented x3 and PERLA  EKG: personally reviewed my interpretation is rate of 81 bpm, normal sinus rhythm, no ST elevation, some Q waves in inferior and lateral leads  Assessment &  Plan by Problem: This is a 62 year old female with a PMH of DM on insulin, HTN, chronic combined systolic and diastolic HF, COPD,  HFrEF, CAD s/p angioplasty for ISR of dRCA on 7/19 presenting with right groin pain and swelling. Admitted for a right femoral pseudoaneurysm.  #Right femoral pseudoaneurysm: Patient had sudden onset right groin pain and swelling, s/p PCI via the right femoral artery on 7/19. A duplex ultrasound showed a small right femoral pseudoaneurysm and a large surrounding hematoma. Patient had conscious sedation and ultrasound-guided compression of her pseudoaneurysm. Post compression ultrasound showed resolution of the pseudoaneurysm. -Vascular surgery on board -Cardiology consult -Tylenol for pain   #Acute on chronic diastolic and systolic heart failure: She has SOB with orthopnea and DOE, wheezing, and bilateral LE edema that seems to be worsening. She has an 18 lb weight gain since last month after being discharged following an acute on chronic heart failure exacerbation. Patient is on 80 mg BID lasix at home. There is a concern for acute on chronic heart failure exacerbation.  -S/p 80 mg IV lasix -Continue Carvedilol BID -Hold IMDUR -Hold entresto -CXR -Daily weights -Strict I+Os  #CAD: Patient had a NSTEMI in July 2017. She got a PCI of her right coronary artery on 7/19 after stent restenosis. She states that she does not take her ASA.  -Continue ASA and plavix  #DM type 2: Patients glucose on admission was 120. She is on trulicity, semaglutide, insulin 40 units at bedtime, and insulin aspart 2-14 TID with meals at home.  -Hold home medications -SSI-moderate -Lantus 10 daily -CBGs 4x/day  #COPD: Patient has a history of COPD.  -BREO and Incruse -Duo-nebs   #Hyperthyroidism: On synthroid at home. S/p thyroidectomy. Last TSH 2.218 on 6/22. -Continue home medications  #Chronic back pain: History of chronic back pain. Is on cymbalta for this.  -Continue home  medications  #Secondary adrenal insufficiency: Patient is on hydrocortisone for this, 15 mg in AM and 5 mg in the afternoon.  -Hydrocortisone 20 mg in AM and 10 mg in afternoon  #EtOH use: Patient reports drinking everyday, 1 drink per day.  -CIWA protocol  FEN: No fluids, replete lytes prn, NPO VTE ppx: Plavix Code Status: FULL  Dispo: Admit patient to Observation with expected length of stay less than 2 midnights.  Signed: Asencion Noble, MD 10/17/2017, 11:01 PM  Pager:  [phone]  

## 2017-10-18 ENCOUNTER — Observation Stay (HOSPITAL_COMMUNITY): Payer: Federal, State, Local not specified - PPO

## 2017-10-18 ENCOUNTER — Ambulatory Visit: Payer: Federal, State, Local not specified - PPO | Admitting: Cardiology

## 2017-10-18 ENCOUNTER — Ambulatory Visit (HOSPITAL_COMMUNITY): Payer: Federal, State, Local not specified - PPO

## 2017-10-18 ENCOUNTER — Encounter (HOSPITAL_COMMUNITY): Payer: Self-pay

## 2017-10-18 DIAGNOSIS — E039 Hypothyroidism, unspecified: Secondary | ICD-10-CM | POA: Diagnosis not present

## 2017-10-18 DIAGNOSIS — J449 Chronic obstructive pulmonary disease, unspecified: Secondary | ICD-10-CM | POA: Diagnosis not present

## 2017-10-18 DIAGNOSIS — E8770 Fluid overload, unspecified: Secondary | ICD-10-CM | POA: Diagnosis not present

## 2017-10-18 DIAGNOSIS — Z7982 Long term (current) use of aspirin: Secondary | ICD-10-CM | POA: Diagnosis not present

## 2017-10-18 DIAGNOSIS — E2749 Other adrenocortical insufficiency: Secondary | ICD-10-CM | POA: Diagnosis not present

## 2017-10-18 DIAGNOSIS — I252 Old myocardial infarction: Secondary | ICD-10-CM | POA: Diagnosis not present

## 2017-10-18 DIAGNOSIS — D62 Acute posthemorrhagic anemia: Secondary | ICD-10-CM | POA: Diagnosis not present

## 2017-10-18 DIAGNOSIS — I724 Aneurysm of artery of lower extremity: Secondary | ICD-10-CM | POA: Diagnosis not present

## 2017-10-18 DIAGNOSIS — I97638 Postprocedural hematoma of a circulatory system organ or structure following other circulatory system procedure: Secondary | ICD-10-CM | POA: Diagnosis not present

## 2017-10-18 DIAGNOSIS — I509 Heart failure, unspecified: Secondary | ICD-10-CM | POA: Diagnosis not present

## 2017-10-18 DIAGNOSIS — E119 Type 2 diabetes mellitus without complications: Secondary | ICD-10-CM | POA: Diagnosis not present

## 2017-10-18 DIAGNOSIS — I255 Ischemic cardiomyopathy: Secondary | ICD-10-CM | POA: Diagnosis not present

## 2017-10-18 DIAGNOSIS — M79609 Pain in unspecified limb: Secondary | ICD-10-CM | POA: Diagnosis not present

## 2017-10-18 DIAGNOSIS — I9789 Other postprocedural complications and disorders of the circulatory system, not elsewhere classified: Secondary | ICD-10-CM | POA: Diagnosis not present

## 2017-10-18 DIAGNOSIS — R0602 Shortness of breath: Secondary | ICD-10-CM | POA: Diagnosis not present

## 2017-10-18 DIAGNOSIS — Z794 Long term (current) use of insulin: Secondary | ICD-10-CM | POA: Diagnosis not present

## 2017-10-18 DIAGNOSIS — R58 Hemorrhage, not elsewhere classified: Secondary | ICD-10-CM | POA: Diagnosis not present

## 2017-10-18 DIAGNOSIS — I2510 Atherosclerotic heart disease of native coronary artery without angina pectoris: Secondary | ICD-10-CM | POA: Diagnosis present

## 2017-10-18 DIAGNOSIS — I729 Aneurysm of unspecified site: Secondary | ICD-10-CM

## 2017-10-18 DIAGNOSIS — Z955 Presence of coronary angioplasty implant and graft: Secondary | ICD-10-CM | POA: Diagnosis not present

## 2017-10-18 DIAGNOSIS — S301XXD Contusion of abdominal wall, subsequent encounter: Secondary | ICD-10-CM | POA: Diagnosis not present

## 2017-10-18 DIAGNOSIS — Z79899 Other long term (current) drug therapy: Secondary | ICD-10-CM | POA: Diagnosis not present

## 2017-10-18 DIAGNOSIS — R42 Dizziness and giddiness: Secondary | ICD-10-CM | POA: Diagnosis not present

## 2017-10-18 DIAGNOSIS — I771 Stricture of artery: Secondary | ICD-10-CM | POA: Diagnosis not present

## 2017-10-18 DIAGNOSIS — T81718A Complication of other artery following a procedure, not elsewhere classified, initial encounter: Secondary | ICD-10-CM

## 2017-10-18 DIAGNOSIS — Z91048 Other nonmedicinal substance allergy status: Secondary | ICD-10-CM | POA: Diagnosis not present

## 2017-10-18 DIAGNOSIS — N182 Chronic kidney disease, stage 2 (mild): Secondary | ICD-10-CM | POA: Diagnosis not present

## 2017-10-18 DIAGNOSIS — I5033 Acute on chronic diastolic (congestive) heart failure: Secondary | ICD-10-CM | POA: Diagnosis not present

## 2017-10-18 DIAGNOSIS — I5043 Acute on chronic combined systolic (congestive) and diastolic (congestive) heart failure: Secondary | ICD-10-CM | POA: Diagnosis not present

## 2017-10-18 DIAGNOSIS — E876 Hypokalemia: Secondary | ICD-10-CM | POA: Diagnosis not present

## 2017-10-18 DIAGNOSIS — I5030 Unspecified diastolic (congestive) heart failure: Secondary | ICD-10-CM | POA: Diagnosis not present

## 2017-10-18 DIAGNOSIS — I110 Hypertensive heart disease with heart failure: Secondary | ICD-10-CM | POA: Diagnosis not present

## 2017-10-18 DIAGNOSIS — Z720 Tobacco use: Secondary | ICD-10-CM | POA: Diagnosis not present

## 2017-10-18 DIAGNOSIS — E7800 Pure hypercholesterolemia, unspecified: Secondary | ICD-10-CM | POA: Diagnosis present

## 2017-10-18 DIAGNOSIS — R339 Retention of urine, unspecified: Secondary | ICD-10-CM | POA: Diagnosis not present

## 2017-10-18 DIAGNOSIS — R0989 Other specified symptoms and signs involving the circulatory and respiratory systems: Secondary | ICD-10-CM | POA: Diagnosis not present

## 2017-10-18 DIAGNOSIS — Z79890 Hormone replacement therapy (postmenopausal): Secondary | ICD-10-CM | POA: Diagnosis not present

## 2017-10-18 DIAGNOSIS — G43009 Migraine without aura, not intractable, without status migrainosus: Secondary | ICD-10-CM | POA: Diagnosis not present

## 2017-10-18 DIAGNOSIS — I130 Hypertensive heart and chronic kidney disease with heart failure and stage 1 through stage 4 chronic kidney disease, or unspecified chronic kidney disease: Secondary | ICD-10-CM | POA: Diagnosis present

## 2017-10-18 DIAGNOSIS — I5023 Acute on chronic systolic (congestive) heart failure: Secondary | ICD-10-CM | POA: Diagnosis not present

## 2017-10-18 DIAGNOSIS — E1122 Type 2 diabetes mellitus with diabetic chronic kidney disease: Secondary | ICD-10-CM | POA: Diagnosis present

## 2017-10-18 DIAGNOSIS — I97610 Postprocedural hemorrhage and hematoma of a circulatory system organ or structure following a cardiac catheterization: Secondary | ICD-10-CM | POA: Diagnosis present

## 2017-10-18 DIAGNOSIS — R34 Anuria and oliguria: Secondary | ICD-10-CM | POA: Diagnosis not present

## 2017-10-18 DIAGNOSIS — R05 Cough: Secondary | ICD-10-CM | POA: Diagnosis not present

## 2017-10-18 DIAGNOSIS — I959 Hypotension, unspecified: Secondary | ICD-10-CM | POA: Diagnosis present

## 2017-10-18 DIAGNOSIS — F419 Anxiety disorder, unspecified: Secondary | ICD-10-CM | POA: Diagnosis present

## 2017-10-18 DIAGNOSIS — Z888 Allergy status to other drugs, medicaments and biological substances status: Secondary | ICD-10-CM | POA: Diagnosis not present

## 2017-10-18 DIAGNOSIS — I739 Peripheral vascular disease, unspecified: Secondary | ICD-10-CM | POA: Diagnosis not present

## 2017-10-18 DIAGNOSIS — Z6841 Body Mass Index (BMI) 40.0 and over, adult: Secondary | ICD-10-CM | POA: Diagnosis not present

## 2017-10-18 DIAGNOSIS — I5042 Chronic combined systolic (congestive) and diastolic (congestive) heart failure: Secondary | ICD-10-CM | POA: Diagnosis not present

## 2017-10-18 LAB — GLUCOSE, CAPILLARY
GLUCOSE-CAPILLARY: 285 mg/dL — AB (ref 70–99)
Glucose-Capillary: 87 mg/dL (ref 70–99)
Glucose-Capillary: 116 mg/dL — ABNORMAL HIGH (ref 70–99)
Glucose-Capillary: 247 mg/dL — ABNORMAL HIGH (ref 70–99)

## 2017-10-18 LAB — BASIC METABOLIC PANEL
Anion gap: 6 (ref 5–15)
BUN: 17 mg/dL (ref 8–23)
CHLORIDE: 105 mmol/L (ref 98–111)
CO2: 33 mmol/L — ABNORMAL HIGH (ref 22–32)
Calcium: 8.8 mg/dL — ABNORMAL LOW (ref 8.9–10.3)
Creatinine, Ser: 1.19 mg/dL — ABNORMAL HIGH (ref 0.44–1.00)
GFR calc Af Amer: 56 mL/min — ABNORMAL LOW (ref >60)
GFR calc non Af Amer: 48 mL/min — ABNORMAL LOW (ref >60)
GLUCOSE: 80 mg/dL (ref 70–99)
Potassium: 3.6 mmol/L (ref 3.5–5.1)
Sodium: 144 mmol/L (ref 135–145)

## 2017-10-18 LAB — CBC
HCT: 24.5 % — ABNORMAL LOW (ref 36.0–46.0)
HCT: 27.6 % — ABNORMAL LOW (ref 36.0–46.0)
Hemoglobin: 7.7 g/dL — ABNORMAL LOW (ref 12.0–15.0)
Hemoglobin: 8.6 g/dL — ABNORMAL LOW (ref 12.0–15.0)
MCH: 31.6 pg (ref 26.0–34.0)
MCH: 30 pg (ref 26.0–34.0)
MCHC: 31.4 g/dL (ref 30.0–36.0)
MCHC: 31.2 g/dL (ref 30.0–36.0)
MCV: 100.4 fL — ABNORMAL HIGH (ref 78.0–100.0)
MCV: 96.2 fL (ref 78.0–100.0)
Platelets: 164 10*3/uL (ref 150–400)
Platelets: 154 10*3/uL (ref 150–400)
RBC: 2.44 MIL/uL — AB (ref 3.87–5.11)
RBC: 2.87 MIL/uL — ABNORMAL LOW (ref 3.87–5.11)
RDW: 16 % — ABNORMAL HIGH (ref 11.5–15.5)
RDW: 16.6 % — ABNORMAL HIGH (ref 11.5–15.5)
WBC: 8.9 10*3/uL (ref 4.0–10.5)
WBC: 11.1 10*3/uL — ABNORMAL HIGH (ref 4.0–10.5)

## 2017-10-18 LAB — URINALYSIS, ROUTINE W REFLEX MICROSCOPIC
Bacteria, UA: NONE SEEN
Bilirubin Urine: NEGATIVE
Glucose, UA: NEGATIVE mg/dL
HGB URINE DIPSTICK: NEGATIVE
KETONES UR: NEGATIVE mg/dL
LEUKOCYTES UA: NEGATIVE
NITRITE: NEGATIVE
PH: 5 (ref 5.0–8.0)
Protein, ur: NEGATIVE mg/dL
SPECIFIC GRAVITY, URINE: 1.015 (ref 1.005–1.030)

## 2017-10-18 LAB — PROTEIN / CREATININE RATIO, URINE
CREATININE, URINE: 145.85 mg/dL
Protein Creatinine Ratio: 0.06 mg/mg{Cre} (ref 0.00–0.15)
Total Protein, Urine: 9 mg/dL

## 2017-10-18 LAB — ABO/RH: ABO/RH(D): A POS

## 2017-10-18 LAB — PREPARE RBC (CROSSMATCH)

## 2017-10-18 LAB — HIV ANTIBODY (ROUTINE TESTING W REFLEX): HIV SCREEN 4TH GENERATION: NONREACTIVE

## 2017-10-18 MED ORDER — OXYCODONE HCL 5 MG PO TABS
10.0000 mg | ORAL_TABLET | Freq: Four times a day (QID) | ORAL | Status: AC | PRN
  Administered 2017-10-18: 10 mg via ORAL
  Filled 2017-10-18: qty 2

## 2017-10-18 MED ORDER — ADULT MULTIVITAMIN W/MINERALS CH
1.0000 | ORAL_TABLET | Freq: Every day | ORAL | Status: DC
  Administered 2017-10-18 – 2017-10-21 (×4): 1 via ORAL
  Filled 2017-10-18 (×4): qty 1

## 2017-10-18 MED ORDER — HYDROCORTISONE 10 MG PO TABS
10.0000 mg | ORAL_TABLET | Freq: Every day | ORAL | Status: DC
  Administered 2017-10-18 – 2017-10-21 (×4): 10 mg via ORAL
  Filled 2017-10-18 (×4): qty 1

## 2017-10-18 MED ORDER — OXYCODONE HCL 5 MG PO TABS
10.0000 mg | ORAL_TABLET | Freq: Once | ORAL | Status: AC
  Administered 2017-10-18: 10 mg via ORAL
  Filled 2017-10-18: qty 2

## 2017-10-18 MED ORDER — HYDROCORTISONE 5 MG PO TABS: 5.0000 mg | ORAL_TABLET | Freq: Every day | ORAL | Status: DC

## 2017-10-18 MED ORDER — FUROSEMIDE 10 MG/ML IJ SOLN
120.0000 mg | Freq: Two times a day (BID) | INTRAVENOUS | Status: DC
  Administered 2017-10-18 – 2017-10-19 (×3): 120 mg via INTRAVENOUS
  Filled 2017-10-18: qty 2
  Filled 2017-10-18 (×3): qty 12

## 2017-10-18 MED ORDER — OXYBUTYNIN CHLORIDE ER 15 MG PO TB24
15.0000 mg | ORAL_TABLET | Freq: Every day | ORAL | Status: DC
  Administered 2017-10-19 – 2017-10-20 (×2): 15 mg via ORAL
  Filled 2017-10-18 (×5): qty 1

## 2017-10-18 MED ORDER — VITAMIN B-1 100 MG PO TABS
100.0000 mg | ORAL_TABLET | Freq: Every day | ORAL | Status: DC
Start: 2017-10-18 — End: 2017-10-21
  Administered 2017-10-18 – 2017-10-21 (×4): 100 mg via ORAL
  Filled 2017-10-18 (×4): qty 1

## 2017-10-18 MED ORDER — LORAZEPAM 2 MG/ML IJ SOLN: 1.0000 mg | Freq: Four times a day (QID) | INTRAMUSCULAR | Status: AC | PRN

## 2017-10-18 MED ORDER — HYDROCORTISONE 10 MG PO TABS
15.0000 mg | ORAL_TABLET | Freq: Every day | ORAL | Status: DC
  Filled 2017-10-18: qty 1

## 2017-10-18 MED ORDER — SODIUM CHLORIDE 0.9% IV SOLUTION
Freq: Once | INTRAVENOUS | Status: AC
  Administered 2017-10-18: 12:00:00 via INTRAVENOUS

## 2017-10-18 MED ORDER — FOLIC ACID 1 MG PO TABS
1.0000 mg | ORAL_TABLET | Freq: Every day | ORAL | Status: DC
  Administered 2017-10-18 – 2017-10-21 (×4): 1 mg via ORAL
  Filled 2017-10-18 (×4): qty 1

## 2017-10-18 MED ORDER — HYDROCORTISONE 20 MG PO TABS
20.0000 mg | ORAL_TABLET | Freq: Every day | ORAL | Status: DC
  Administered 2017-10-18 – 2017-10-21 (×4): 20 mg via ORAL
  Filled 2017-10-18 (×4): qty 1

## 2017-10-18 MED ORDER — THIAMINE HCL 100 MG/ML IJ SOLN
100.0000 mg | Freq: Every day | INTRAMUSCULAR | Status: DC
  Filled 2017-10-18: qty 2

## 2017-10-18 NOTE — Progress Notes (Signed)
Progress Note  Patient Name: Crystal Yates Date of Encounter: 10/18/2017  Primary Cardiologist: Jenne Campus, MD   Subjective   Has not urinated.  No chest pain.  No significant shortness of breath.  Inpatient Medications    Scheduled Meds: . sodium chloride   Intravenous Once  . aspirin  81 mg Oral Daily  . atorvastatin  80 mg Oral q1800  . clopidogrel  75 mg Oral Daily  . DULoxetine  60 mg Oral Daily  . fluticasone furoate-vilanterol  1 puff Inhalation Daily   And  . umeclidinium bromide  1 puff Inhalation Daily  . folic acid  1 mg Oral Daily  . hydrocortisone  10 mg Oral Q1200  . hydrocortisone  20 mg Oral Q breakfast  . insulin aspart  0-15 Units Subcutaneous TID WC  . insulin aspart  0-5 Units Subcutaneous QHS  . insulin glargine  10 Units Subcutaneous QHS  . levothyroxine  150 mcg Oral QAC breakfast  . multivitamin with minerals  1 tablet Oral Daily  . ondansetron (ZOFRAN) IV  4 mg Intravenous Once  . oxybutynin  15 mg Oral QHS  . thiamine  100 mg Oral Daily   Or  . thiamine  100 mg Intravenous Daily   Continuous Infusions:  PRN Meds: acetaminophen, ipratropium-albuterol, LORazepam   Vital Signs    Vitals:   10/18/17 0520 10/18/17 0527 10/18/17 0639 10/18/17 0738  BP: (!) 78/51 (!) 80/60 (!) 89/53   Pulse:      Resp:      Temp:      TempSrc:      SpO2:    100%  Weight:      Height:       No intake or output data in the 24 hours ending 10/18/17 0953 Filed Weights   10/17/17 1812 10/17/17 2108  Weight: 230 lb (104.3 kg) 231 lb 11.3 oz (105.1 kg)    Telemetry    No adverse arrhythmias- Personally Reviewed  ECG    No new- Personally Reviewed  Physical Exam   GEN: No acute distress.  Obese Neck: No JVD Cardiac: RRR, no murmurs, rubs, or gallops.  Respiratory: Clear to auscultation bilaterally. GI: Soft, nontender, non-distended  MS:  Chronic appearing lower extremity edema; No deformity.  Hematoma noted right groin Neuro:   Nonfocal  Psych: Normal affect   Labs    Chemistry Recent Labs  Lab 10/11/17 1434 10/14/17 0214 10/17/17 1845 10/18/17 0330  NA 143 142 141 144  K 3.6 4.0 3.8 3.6  CL 104 106 101 105  CO2 30 31  --  33*  GLUCOSE 156* 129* 120* 80  BUN 21 14 18 17   CREATININE 1.12* 0.94 1.10* 1.19*  CALCIUM 9.1 8.6*  --  8.8*  GFRNONAA 52* >60  --  48*  GFRAA 60* >60  --  56*  ANIONGAP 9 5  --  6     Hematology Recent Labs  Lab 10/14/17 0214 10/17/17 1820 10/17/17 1845 10/18/17 0330  WBC 7.9 10.7*  --  8.9  RBC 3.62* 2.60*  --  2.44*  HGB 10.8* 8.0* 9.9* 7.7*  HCT 35.9* 26.1* 29.0* 24.5*  MCV 99.2 100.4*  --  100.4*  MCH 29.8 30.8  --  31.6  MCHC 30.1 30.7  --  31.4  RDW 16.5* 16.0*  --  16.0*  PLT 182 202  --  164    Cardiac EnzymesNo results for input(s): TROPONINI in the last 168 hours.  Recent Labs  Lab 10/11/17 1457 10/11/17 1801  TROPIPOC 0.01 0.00     BNP Recent Labs  Lab 10/11/17 1434  BNP 147.7*     DDimer No results for input(s): DDIMER in the last 168 hours.   Radiology    Dg Chest 2 View  Result Date: 10/18/2017 CLINICAL DATA:  Several weeks of cough. Onset of shortness of breath today. EXAM: CHEST - 2 VIEW COMPARISON:  Chest x-ray and chest CT scan of October 11, 2017. FINDINGS: The lungs remain hyper inflated. There is no focal infiltrate. There is no pleural effusion or pneumothorax. The heart and pulmonary vascularity are normal. Coronary artery calcifications and stents are visible. There is calcification in the wall of the aortic arch. The mediastinum is normal in width. The bony thorax exhibits no acute abnormality. IMPRESSION: COPD. No pneumonia, CHF, nor other acute cardiopulmonary abnormality. Thoracic aortic atherosclerosis. Electronically Signed   By: David  Martinique M.D.   On: 10/18/2017 07:36    Cardiac Studies   No new  Patient Profile     62 y.o. female with pseudoaneurysm of femoral artery, thrombosed following compression in the  emergency room.  Assessment & Plan    Post-cath femoral artery pseudoaneurysm, hematoma.  -Reduction in hemoglobin.  7.7.  Continue to monitor CBC, if continues to be less than 8, strongly consider transfusion given coronary artery disease.  Hypotension.  Off of antihypertensives.  Continuing with aspirin and Plavix.      For questions or updates, please contact [place] Please consult [email or web address] for contact info under Cardiology/STEMI.      Signed, Candee Furbish, MD  10/18/2017, 9:53 AM

## 2017-10-18 NOTE — Progress Notes (Signed)
Post Pseudoaneurysm compression evaluation completed. Preliminary results - Pseudoaneurysm appears remained thrombosed. No evidence of flow notes. [place] Parisha Beaulac,RVS 10/18/2017 9:08 AM

## 2017-10-18 NOTE — Progress Notes (Addendum)
Subjective:  Crystal Yates states that the pain in her leg is improved since yesterday. She denies numbness in her extremities. She denies chest pain and says she doesn't know if she has increassed SOB because of her minimal activity level, but that it has not been more difficult to catch her breath sitting in the bed. She has not urinated in the last 13 hours despite getting 80 mg Lasix IV twice the day before.     Objective:  Vital signs in last 24 hours: Vitals:   10/18/17 0520 10/18/17 0527 10/18/17 0639 10/18/17 0738  BP: (!) 78/51 (!) 80/60 (!) 89/53   Pulse:      Resp:      Temp:      TempSrc:      SpO2:    100%  Weight:      Height:       Weight change:   Intake/Output Summary (Last 24 hours) at 10/18/2017 1351 Last data filed at 10/18/2017 1300 Gross per 24 hour  Intake 240 ml  Output -  Net 240 ml   Physical Exam: General: no acute distress  Pulm: diffuse crackles, bilateral lower lung fields> upper lung fields  CV: RRR, nMRG  Abd: hematoma evident extending from right hip to RLQ abdomen, circled by black marker, it is within the bounds of this demarcation and appears to be receding by ~3cm, soft, NTND Ext: 4+ pitting edema to knees, edema extends to thighs and is evident on arms, pulses hard to palpate secondary to large fluid layer, extremities warm   Assessment/Plan:  Active Problems:   Pseudoaneurysm (HCC)   Pseudoaneurysm following procedure ([place])  #Oliguria without AKI:  Despite receiving 80 mg of IV Lasix yesterday the patient was oliguric overnight. She states that her Cr has been rising while on Lasix. However, Cr still 1.19. Bladder scans do not suggest obstructive cause of urinary retention. Given the amount of fluid the patient is retaining on exam, it is possible that an etiology such as nephrotic failure is working in [employer] with the patient's existing CHF  -Renal US to further assess for obstruction  -If able to get urinary sample- Urine  Protein to Cr ratio to assess for nephrotic syndrome  -we will challenge with 120 mg Lasix IV BID and assess urine output   #Pseudoaneurysm #Acute blood loss anemia  Preliminary pseudoaneurysm compression study reveals thrombosed pseudoaneurysm. We will follow with vascular surgery for further recommendations on management. Hgb 7.7 this AM; in setting of CAD will transfuse 1 unit, with goal Hgb >8. She is asymptomatic currently. --f/u VSS recs and final U/S read --transfuse 1u pRBCs, f/u post-transfusion H/H; goal Hgb >8  #Chronic combined Diastolic and Systolic Heart Failure:  The patient's reported dry weight is 213 and she is 230 now. We have attempted to manage volume status with diuresis but patient remains oliguric. We will work up for other contributing causes of fluid retention and also assess for progression of HF in setting of stent thrombosis and poor compliance with anti-platelet agents. -Lasix 120 mg IV BID today    -Hold home Imdur, entresto, and carvedilol -Daily weights and I and Os  -Echo to assess for CHF progression   #CAD with PCI: Patient with history of MI in 2017 had PCI 07/19 for stent re-thrombosis after non-compliance with dual anti-platelet therapy.  -Cardiology following- appreciate their recs  -continue plavix and ASA   #Secondary Adrenal insufficiency:  Doubled home dose of hydrocortisone in setting of acute stress.  Consider increased dose if BP continues to fall.  -20 mg in AM and 10 mg in afternoon   #Type 2 Diabetes  -Insulin Glargine 10 units qhs  -Novolog with meals and SSI   #COPD No wheezing on exam. Does not appear to be part of exacerbation -continue home BREO and Incruse  -Duonebs   #Hyperthyroidism  Continue home Levothyroxine 150 mg before breakfast   #ETOH The patient has a history of daily use and is on the CIWA protocal  -no signs of acute withdrawal   FEN: No fluids, replete lytes prn, NPO VTE ppx: SCDs Code Status: FULL     LOS: 0 days   Rhetta Mura, Medical Student 10/18/2017, 1:51 PM  [phone]  Attestation for Student Documentation:  I personally was present and performed or re-performed the history, physical exam and medical decision-making activities of this service and have verified that the service and findings are accurately documented in the student's note.  Alphonzo Grieve, MD 10/18/2017, 5:34 PM

## 2017-10-18 NOTE — Progress Notes (Signed)
    Subjective  -   Remains in Bed Wants to eat Still with right groin pain   Physical Exam:  Feet warm and well perfused Ecchymosis and tenderness to right groin   Follow up U/S shows pseudoaneurysm remains closed    Assessment/Plan:    OK to mobilize Advance diet Will need narcotic pain control No plans for surgery as long as pseudoaneurysm remains closed  Wells Brabham 10/18/2017 5:51 PM --  Vitals:   10/18/17 1648 10/18/17 1717  BP: (!) 100/56 (!) 95/53  Pulse: 95 86  Resp: (!) 34 12  Temp: 98.4 F (36.9 C) 98.4 F (36.9 C)  SpO2: 96% 99%    Intake/Output Summary (Last 24 hours) at 10/18/2017 1751 Last data filed at 10/18/2017 1710 Gross per 24 hour  Intake 301.8 ml  Output 350 ml  Net -48.2 ml     Laboratory CBC    Component Value Date/Time   WBC 8.9 10/18/2017 0330   HGB 7.7 (L) 10/18/2017 0330   HCT 24.5 (L) 10/18/2017 0330   PLT 164 10/18/2017 0330    BMET    Component Value Date/Time   NA 144 10/18/2017 0330   NA 144 10/04/2017 1522   K 3.6 10/18/2017 0330   CL 105 10/18/2017 0330   CO2 33 (H) 10/18/2017 0330   GLUCOSE 80 10/18/2017 0330   BUN 17 10/18/2017 0330   BUN 18 10/04/2017 1522   CREATININE 1.19 (H) 10/18/2017 0330   CALCIUM 8.8 (L) 10/18/2017 0330   GFRNONAA 48 (L) 10/18/2017 0330   GFRAA 56 (L) 10/18/2017 0330    COAG Lab Results  Component Value Date   INR 1.02 10/13/2016   No results found for: PTT  Antibiotics Anti-infectives (From admission, onward)   None       V. Leia Alf, M.D. [facility] Office: [phone] Pager:  [phone]

## 2017-10-18 NOTE — Progress Notes (Addendum)
BP this am low, manual 80/60. Hgb 7.7. No urine output all shift. Pt c/o groin pain and some fatigue. R groin is soft and bruising is unchanged from markings. Pain is limited to groin. On-call notified and will come to see pt. No new orders. Will continue to closely monitor.

## 2017-10-18 NOTE — Progress Notes (Signed)
Pt has not voided since arrival to floor despite IV lasix. Pt report her regular Lasix has not been working for her. Pt denies urge to go. Bladder is not distended. Bladder scan reveals 34 mLs. Will continue to monitor.

## 2017-10-19 ENCOUNTER — Inpatient Hospital Stay (HOSPITAL_COMMUNITY): Payer: Federal, State, Local not specified - PPO

## 2017-10-19 DIAGNOSIS — D62 Acute posthemorrhagic anemia: Secondary | ICD-10-CM

## 2017-10-19 DIAGNOSIS — E0590 Thyrotoxicosis, unspecified without thyrotoxic crisis or storm: Secondary | ICD-10-CM

## 2017-10-19 DIAGNOSIS — I5042 Chronic combined systolic (congestive) and diastolic (congestive) heart failure: Secondary | ICD-10-CM

## 2017-10-19 DIAGNOSIS — R339 Retention of urine, unspecified: Secondary | ICD-10-CM

## 2017-10-19 DIAGNOSIS — I97638 Postprocedural hematoma of a circulatory system organ or structure following other circulatory system procedure: Secondary | ICD-10-CM

## 2017-10-19 DIAGNOSIS — I5030 Unspecified diastolic (congestive) heart failure: Secondary | ICD-10-CM

## 2017-10-19 DIAGNOSIS — I9789 Other postprocedural complications and disorders of the circulatory system, not elsewhere classified: Secondary | ICD-10-CM

## 2017-10-19 DIAGNOSIS — E2740 Unspecified adrenocortical insufficiency: Secondary | ICD-10-CM

## 2017-10-19 LAB — TYPE AND SCREEN
ABO/RH(D): A POS
Antibody Screen: NEGATIVE
UNIT DIVISION: 0

## 2017-10-19 LAB — BPAM RBC
BLOOD PRODUCT EXPIRATION DATE: 201907312359
ISSUE DATE / TIME: 201907241640
Unit Type and Rh: 6200

## 2017-10-19 LAB — BASIC METABOLIC PANEL
ANION GAP: 9 (ref 5–15)
BUN: 21 mg/dL (ref 8–23)
CHLORIDE: 98 mmol/L (ref 98–111)
CO2: 31 mmol/L (ref 22–32)
Calcium: 8.7 mg/dL — ABNORMAL LOW (ref 8.9–10.3)
Creatinine, Ser: 1.2 mg/dL — ABNORMAL HIGH (ref 0.44–1.00)
GFR calc Af Amer: 55 mL/min — ABNORMAL LOW (ref >60)
GFR calc non Af Amer: 47 mL/min — ABNORMAL LOW (ref >60)
Glucose, Bld: 159 mg/dL — ABNORMAL HIGH (ref 70–99)
POTASSIUM: 4.8 mmol/L (ref 3.5–5.1)
Sodium: 138 mmol/L (ref 135–145)

## 2017-10-19 LAB — ECHOCARDIOGRAM COMPLETE
Height: 62 in
Weight: 3768 oz

## 2017-10-19 LAB — CBC
HCT: 29.2 % — ABNORMAL LOW (ref 36.0–46.0)
HEMOGLOBIN: 9.2 g/dL — AB (ref 12.0–15.0)
MCH: 30.2 pg (ref 26.0–34.0)
MCHC: 31.5 g/dL (ref 30.0–36.0)
MCV: 95.7 fL (ref 78.0–100.0)
Platelets: 168 10*3/uL (ref 150–400)
RBC: 3.05 MIL/uL — AB (ref 3.87–5.11)
RDW: 17.2 % — ABNORMAL HIGH (ref 11.5–15.5)
WBC: 11.6 10*3/uL — ABNORMAL HIGH (ref 4.0–10.5)

## 2017-10-19 MED ORDER — RAMELTEON 8 MG PO TABS
8.0000 mg | ORAL_TABLET | Freq: Every day | ORAL | Status: DC
  Administered 2017-10-19 – 2017-10-20 (×2): 8 mg via ORAL
  Filled 2017-10-19 (×3): qty 1

## 2017-10-19 MED ORDER — POLYETHYLENE GLYCOL 3350 17 G PO PACK
17.0000 g | PACK | Freq: Once | ORAL | Status: AC
  Administered 2017-10-19: 17 g via ORAL
  Filled 2017-10-19: qty 1

## 2017-10-19 MED ORDER — PERFLUTREN LIPID MICROSPHERE
1.0000 mL | INTRAVENOUS | Status: AC | PRN
  Administered 2017-10-19: 3 mL via INTRAVENOUS
  Filled 2017-10-19: qty 10

## 2017-10-19 MED ORDER — PERFLUTREN LIPID MICROSPHERE
INTRAVENOUS | Status: AC
  Administered 2017-10-19: 3 mL via INTRAVENOUS
  Filled 2017-10-19: qty 10

## 2017-10-19 MED ORDER — NICOTINE 14 MG/24HR TD PT24
14.0000 mg | MEDICATED_PATCH | Freq: Every day | TRANSDERMAL | Status: DC
  Administered 2017-10-20 – 2017-10-21 (×2): 14 mg via TRANSDERMAL
  Filled 2017-10-19 (×3): qty 1

## 2017-10-19 MED ORDER — SODIUM CHLORIDE 0.9 % IV SOLN
Freq: Two times a day (BID) | INTRAVENOUS | Status: DC
  Administered 2017-10-19 – 2017-10-20 (×3): via INTRAVENOUS
  Filled 2017-10-19 (×6): qty 50

## 2017-10-19 MED ORDER — OXYCODONE HCL 5 MG PO TABS
10.0000 mg | ORAL_TABLET | ORAL | Status: DC | PRN
  Administered 2017-10-19 – 2017-10-21 (×6): 10 mg via ORAL
  Filled 2017-10-19 (×6): qty 2

## 2017-10-19 MED ORDER — FUROSEMIDE 10 MG/ML IJ SOLN: 120.0000 mg | Freq: Two times a day (BID) | INTRAVENOUS | Status: DC

## 2017-10-19 NOTE — Plan of Care (Signed)
  Problem: Education: Goal: Knowledge of General Education information will improve Description Including pain rating scale, medication(s)/side effects and non-pharmacologic comfort measures Outcome: Progressing   Problem: Health Behavior/Discharge Planning: Goal: Ability to manage health-related needs will improve Outcome: Progressing   

## 2017-10-19 NOTE — Progress Notes (Signed)
Inpatient Diabetes Program Recommendations  AACE/ADA: New Consensus Statement on Inpatient Glycemic Control (2015)  Target Ranges:  Prepandial:   less than 140 mg/dL      Peak postprandial:   less than 180 mg/dL (1-2 hours)      Critically ill patients:  140 - 180 mg/dL   Lab Results  Component Value Date   GLUCAP 247 (H) 10/18/2017   HGBA1C 10.5 (H) 09/16/2017    Review of Glycemic ControlResults for GABRIEL, PAULDING (MRN [id number]) as of 10/19/2017 13:15  Ref. Range 10/17/2017 21:13 10/18/2017 06:36 10/18/2017 11:40 10/18/2017 16:13 10/18/2017 21:04  Glucose-Capillary Latest Ref Range: 70 - 99 mg/dL 89 87 116 (H) 285 (H) 247 (H)   Diabetes history: Type 2 Diabetes Outpatient Diabetes medications:  Fiasp 2-14 units tid with meals, Tresiba 40 units q HS Current orders for Inpatient glycemic control:  Lantus 10 units q HS, Novolog moderate tid with meals and HS Inpatient Diabetes Program Recommendations:   Consider increasing Lantus to 15 units q HS.  Also consider adding Novolog 3 units tid with meals (hold if patient eats less than 50%).   Thanks,  Adah Perl, RN, BC-ADM Inpatient Diabetes Coordinator Pager [phone] (8a-5p)

## 2017-10-19 NOTE — Progress Notes (Signed)
  Echocardiogram 2D Echocardiogram with definity has been performed.  Darlina Sicilian M 10/19/2017, 12:10 PM

## 2017-10-19 NOTE — Progress Notes (Addendum)
Subjective:  Crystal Yates denies SOB, chest pain, cough or palpitations. She endorses severe pain in her right thigh at the sight of the pseudoaneurysm. She has not had a BM since admission. The patient plans to try to walk and be more active today.     Objective:  Vital signs in last 24 hours: Vitals:   10/19/17 0258 10/19/17 0300 10/19/17 0743 10/19/17 1156  BP: (!) 117/58 (!) 117/58  138/67  Pulse:  82    Resp:  (!) 34    Temp: 98.8 F (37.1 C)     TempSrc: Oral     SpO2:  95% 97%   Weight: 106.8 kg (235 lb 8 oz)     Height:       Weight change: 2.495 kg (5 lb 8 oz)  Intake/Output Summary (Last 24 hours) at 10/19/2017 1325 Last data filed at 10/19/2017 0900 Gross per 24 hour  Intake 1153.8 ml  Output 1050 ml  Net 103.8 ml   Physical Exam: General: no acute distress  Pulm: no increased work of breathing, fine crackles, lungs predominantly CAB  CV: RRR, nMRG  Abd: hematoma evident extending from right hip to RLQ abdomen, circled by black marker, it is within the bounds of this demarcation and appears to be receding by ~3cm, soft, NTND Ext: 4+ pitting edema to knees, edema extends to thighs, pulses hard to palpate secondary to large fluid layer, extremities warm   Assessment/Plan:  Active Problems:   Pseudoaneurysm (HCC)   Pseudoaneurysm following procedure (HCC)  #Oliguria without AKI:  After 120 mg Lasix yesterday urine output improved at 1050 mL. Bladder scans and Renal US do not suggest obstructive cause of urinary retention. Urine Protein to Cr ratio does not suggest nephrotic cause of Oliguria. Hyaline casts on urine microscopy could suggest ATN, but Cr is stable at 1.20 from 1.19.  -Incidentally noted cysts vs. Solid mass on Renal US- follow with outpatient CT/ MRI -120 Lasix IV BID -strict I and Os and limit fluid intake   #Pseudoaneurysm #Acute blood loss anemia  Preliminary pseudoaneurysm compression study reveals thrombosed pseudoaneurysm. Per vascular  surgery no intervention required. They have signed off. Patient is reporting severe pain secondary to this.  -Oxycodone 10 mg IR q4h prn   #Chronic combined Diastolic and Systolic Heart Failure:  The patient's reported dry weight is 213 and she is 235 now. We have attempted to manage volume status with diuresis with some response. We will assess for progression of HF in setting of stent thrombosis and poor compliance with anti-platelet agents. -Lasix 120 mg IV BID today    -Hold home Imdur, entresto, and carvedilol -Daily weights and I and Os  -Echo to assess for CHF progression   #CAD with PCI: Patient with history of MI in 2017 had PCI 07/19 for stent re-thrombosis after non-compliance with dual anti-platelet therapy.  -Cardiology following- appreciate their recs  -continue plavix and ASA   #Secondary Adrenal insufficiency:  Doubled home dose of hydrocortisone in setting of acute stress. Consider increased dose if BP continues to fall.  -20 mg in AM and 10 mg in afternoon   #Type 2 Diabetes  CBGs became uncontrolled after increased lasix dosing requiring suspension in IVFs. Per chart, appears she has been receiving IV lasix in D50. Discussed with pharmacy who will change suspension to normal saline.  Appreciate DM coordinator recommendations - will hold off on insulin adjustments at this time to see if stopping D50 improves CBGs. -Insulin Glargine 10 units  qhs  -Novolog with meals and SSI   #COPD No wheezing on exam. Does not appear to be part of exacerbation -continue home BREO and Incruse  -Duonebs   #Hyperthyroidism  Continue home Levothyroxine 150 mg before breakfast   #ETOH The patient has a history of daily use and is on the CIWA protocal  -no signs of acute withdrawal   FEN: No fluids, replete lytes prn, NPO VTE ppx: SCDs Code Status: FULL    LOS: 1 day   Rhetta Mura, Medical Student 10/19/2017, 1:25 PM  [phone]   Attestation for Student  Documentation:  I personally was present and performed or re-performed the history, physical exam and medical decision-making activities of this service and have verified that the service and findings are accurately documented in the student's note.  Velna Ochs, MD 10/19/2017, 2:08 PM

## 2017-10-19 NOTE — Progress Notes (Addendum)
  Progress Note    10/19/2017 7:38 AM   Subjective:  Says she is still having pain but better  Afebrile HR 60's-80's NSR 30'Z-601'U systolic 93% [id number]  Vitals:   10/19/17 0258 10/19/17 0300  BP: (!) 117/58 (!) 117/58  Pulse:  82  Resp:  (!) 34  Temp: 98.8 F (37.1 C)   SpO2:  95%    Physical Exam: Cardiac:  Regular Lungs:  Non labored Incisions:  Right groin tender to palpation with ecchymosis but improved from where lines were drawn.  Extremities:  Right monophasic DP doppler signal and biphasic PT doppler signal   CBC    Component Value Date/Time   WBC 11.6 (H) 10/19/2017 0343   RBC 3.05 (L) 10/19/2017 0343   HGB 9.2 (L) 10/19/2017 0343   HCT 29.2 (L) 10/19/2017 0343   PLT 168 10/19/2017 0343   MCV 95.7 10/19/2017 0343   MCH 30.2 10/19/2017 0343   MCHC 31.5 10/19/2017 0343   RDW 17.2 (H) 10/19/2017 0343   LYMPHSABS 2.3 10/17/2017 1820   MONOABS 0.7 10/17/2017 1820   EOSABS 0.2 10/17/2017 1820   BASOSABS 0.1 10/17/2017 1820    BMET    Component Value Date/Time   NA 138 10/19/2017 0343   NA 144 10/04/2017 1522   K 4.8 10/19/2017 0343   CL 98 10/19/2017 0343   CO2 31 10/19/2017 0343   GLUCOSE 159 (H) 10/19/2017 0343   BUN 21 10/19/2017 0343   BUN 18 10/04/2017 1522   CREATININE 1.20 (H) 10/19/2017 0343   CALCIUM 8.7 (L) 10/19/2017 0343   GFRNONAA 47 (L) 10/19/2017 0343   GFRAA 55 (L) 10/19/2017 0343    INR    Component Value Date/Time   INR 1.02 10/13/2016 0455     Intake/Output Summary (Last 24 hours) at 10/19/2017 0738 Last data filed at 10/19/2017 0300 Gross per 24 hour  Intake 1273.8 ml  Output 1050 ml  Net 223.8 ml     Assessment:  62 y.o. female is s/p:  Compression of PSA right     Plan: -u/s yesterday confirmed psa thrombosed.   -continues to have doppler signals present right foot -hgb improved from yesterday -mobilize -no plan for surgery as long as psa remains thrombosed.     Leontine Locket, PA-C [facility] [phone] 10/19/2017 7:38 AM  I agree with the above.  I seen and evaluated patient.She continues to have right groin pain.  I suspect this will persist for several weeks.  Pseudoaneurysm was confirmed to be thrombosed on yesterday's follow-up ultrasound.  No vascular surgery is recommended at this time.  I will sign off for now.  Please contact us for additional questions.  Annamarie Major

## 2017-10-19 NOTE — Progress Notes (Addendum)
Progress Note  Patient Name: Crystal Yates Date of Encounter: 10/19/2017  Primary Cardiologist: Jenne Campus, MD   Subjective   Pt crying with groin pain and abd pain.  Per Dr. Trula Slade this may be ongoing, pt on cymbalta   Inpatient Medications    Scheduled Meds: . aspirin  81 mg Oral Daily  . atorvastatin  80 mg Oral q1800  . clopidogrel  75 mg Oral Daily  . DULoxetine  60 mg Oral Daily  . fluticasone furoate-vilanterol  1 puff Inhalation Daily   And  . umeclidinium bromide  1 puff Inhalation Daily  . folic acid  1 mg Oral Daily  . hydrocortisone  10 mg Oral Q1200  . hydrocortisone  20 mg Oral Q breakfast  . insulin aspart  0-15 Units Subcutaneous TID WC  . insulin aspart  0-5 Units Subcutaneous QHS  . insulin glargine  10 Units Subcutaneous QHS  . levothyroxine  150 mcg Oral QAC breakfast  . multivitamin with minerals  1 tablet Oral Daily  . nicotine  14 mg Transdermal Daily  . ondansetron (ZOFRAN) IV  4 mg Intravenous Once  . oxybutynin  15 mg Oral QHS  . thiamine  100 mg Oral Daily   Or  . thiamine  100 mg Intravenous Daily   Continuous Infusions: . furosemide 120 mg (10/19/17 0844)   PRN Meds: acetaminophen, ipratropium-albuterol, LORazepam, perflutren lipid microspheres (DEFINITY) IV suspension   Vital Signs    Vitals:   10/19/17 0258 10/19/17 0300 10/19/17 0743 10/19/17 1156  BP: (!) 117/58 (!) 117/58  138/67  Pulse:  82    Resp:  (!) 34    Temp: 98.8 F (37.1 C)     TempSrc: Oral     SpO2:  95% 97%   Weight: 235 lb 8 oz (106.8 kg)     Height:        Intake/Output Summary (Last 24 hours) at 10/19/2017 1217 Last data filed at 10/19/2017 0900 Gross per 24 hour  Intake 1393.8 ml  Output 1050 ml  Net 343.8 ml   Filed Weights   10/17/17 1812 10/17/17 2108 10/19/17 0258  Weight: 230 lb (104.3 kg) 231 lb 11.3 oz (105.1 kg) 235 lb 8 oz (106.8 kg)    Telemetry    SR - Personally Reviewed  ECG    No new - Personally  Reviewed  Physical Exam   GEN: No acute distress. But tearful   Neck: No JVD Cardiac: RRR, no murmurs, rubs, or gallops.  Respiratory: Clear to auscultation bilaterally ant. Marland Kitchen GI: Soft, nontender, non-distended  MS: 2+ lower ext edema; No deformity. Rt groin with ecchymosis Neuro:  Nonfocal  Psych: Normal affect to sad  Labs    Chemistry Recent Labs  Lab 10/14/17 0214 10/17/17 1845 10/18/17 0330 10/19/17 0343  NA 142 141 144 138  K 4.0 3.8 3.6 4.8  CL 106 101 105 98  CO2 31  --  33* 31  GLUCOSE 129* 120* 80 159*  BUN 14 18 17 21   CREATININE 0.94 1.10* 1.19* 1.20*  CALCIUM 8.6*  --  8.8* 8.7*  GFRNONAA >60  --  48* 47*  GFRAA >60  --  56* 55*  ANIONGAP 5  --  6 9     Hematology Recent Labs  Lab 10/18/17 0330 10/18/17 2124 10/19/17 0343  WBC 8.9 11.1* 11.6*  RBC 2.44* 2.87* 3.05*  HGB 7.7* 8.6* 9.2*  HCT 24.5* 27.6* 29.2*  MCV 100.4* 96.2 95.7  MCH 31.6  30.0 30.2  MCHC 31.4 31.2 31.5  RDW 16.0* 16.6* 17.2*  PLT 164 154 168    Cardiac EnzymesNo results for input(s): TROPONINI in the last 168 hours. No results for input(s): TROPIPOC in the last 168 hours.   BNPNo results for input(s): BNP, PROBNP in the last 168 hours.   DDimer No results for input(s): DDIMER in the last 168 hours.   Radiology    Dg Chest 2 View  Result Date: 10/18/2017 CLINICAL DATA:  Several weeks of cough. Onset of shortness of breath today. EXAM: CHEST - 2 VIEW COMPARISON:  Chest x-ray and chest CT scan of October 11, 2017. FINDINGS: The lungs remain hyper inflated. There is no focal infiltrate. There is no pleural effusion or pneumothorax. The heart and pulmonary vascularity are normal. Coronary artery calcifications and stents are visible. There is calcification in the wall of the aortic arch. The mediastinum is normal in width. The bony thorax exhibits no acute abnormality. IMPRESSION: COPD. No pneumonia, CHF, nor other acute cardiopulmonary abnormality. Thoracic aortic atherosclerosis.  Electronically Signed   By: David  Martinique M.D.   On: 10/18/2017 07:36   US Renal  Result Date: 10/18/2017 CLINICAL DATA:  Decreased urine output for 1 day. Stage 2 chronic kidney disease. EXAM: RENAL / URINARY TRACT ULTRASOUND COMPLETE COMPARISON:  Abdomen and pelvis CT dated 04/16/2016, 10/30/2015, 09/15/2015, 06/30/2015 and 11/25/2014. FINDINGS: Right Kidney: Length: 10.8 cm. Echogenicity within normal limits. No mass or hydronephrosis visualized. Left Kidney: Length: 11.9 cm. 1.7 x 1.3 x 1.1 cm poorly visualized mildly hypodense mass in the lower pole with no internal blood flow seen with color Doppler. On the Doppler images, this appears cystic. No hydronephrosis. Normal parenchymal echotexture. Bladder: Not visualized.  The patient voided prior to the examination. IMPRESSION: 1. No acute abnormality. 2. 1.7 cm probable mildly complicated cyst arising from the lower pole of the left kidney. A solid mass cannot be excluded. Therefore, further evaluation with pre and postcontrast CT or MRI of the kidneys is recommended. 3. Nonvisualized urinary bladder due to voiding prior to the examination. Electronically Signed   By: Claudie Revering M.D.   On: 10/18/2017 16:20    Cardiac Studies   Echo pending   Cath rt and Lt heart cath 10/13/17   Normal left main.  Proximal to mid LAD stent with diffuse 30% restenosis.  Proximal circumflex with an eccentric 50 to 65% stenosis depending upon review..  Progression at this site is noted when compared to one year ago when this region contained 30% narrowing.  FFR was not possible due to COPD and intolerance of adenosine.  Dominant right coronary with proximal to distal stenting and overlap fashion, possibly with some region of stent within stent.  Distally there is an eccentric 70 to 75% stenosis that is clearly progressed when compared to one year ago when there was less than 50% narrowing.  Mild pulmonary hypertension with mean PA pressure of 30  mmHg.  Reduced LV systolic function with regional wall motion abnormality including severe hypokinesis of the inferobasal wall.  Estimated EF 40 to 45%.  LVEDP is 20 mmHg.  Mean pulmonary capillary wedge pressure is 20 mmHg.  Successful balloon angioplasty of distal right coronary ISR reducing from 75% to 0% with TIMI grade III flow using a 3.5 mm [place] balloon x2 inflations.  RECOMMENDATIONS:  Dual antiplatelet therapy indefinitely  Risk factor modification.  We discussed smoking cessation.  LDL less than 70.  Bivalirudin is discontinued and sheath will be removed  later this afternoon.  Monitor closely for bleeding/access site hematoma.  If stable in a.m. without access site complication, she will be eligible for discharge.  Consider circumflex PCI if persistent symptoms/angina.  Recommend to resume Aspirin and Plavix, at currently prescribed dose and frequency, on To be continued indefinitely..  Indefinite  Patient Profile     62 y.o. female with pseudoaneurysm of femoral artery, thrombosed following compression in the emergency room.  Assessment & Plan    Post cath femoral artery pseudoaneurysm   --on ASA and plavix --Hgb up to 9.2 today   WBC 11.6  Lower ext edema on lasix 120 mg BID IV started yesterday   Rt leg pain and increased anxiety.  She is on cymbalta ? Need for prn anxiety med or different pain med.  Per IM  For questions or updates, please contact [place] Please consult [email or web address] for contact info under Cardiology/STEMI.      Signed, Cecilie Kicks, NP  10/19/2017, 12:17 PM    Personally seen and examined. Agree with above.  Still crying, urine burring, anxious.  Post cath hematoma with pseudoaneursym post compression, successful wit 1 unit PRBC  - supportive care  - Hg >8  - Pain control Hematoma stable, alert, upset, RRR, CTAB. Labs stable post tx  Candee Furbish, MD

## 2017-10-20 ENCOUNTER — Inpatient Hospital Stay (HOSPITAL_COMMUNITY): Payer: Federal, State, Local not specified - PPO

## 2017-10-20 DIAGNOSIS — E1151 Type 2 diabetes mellitus with diabetic peripheral angiopathy without gangrene: Secondary | ICD-10-CM

## 2017-10-20 DIAGNOSIS — E873 Alkalosis: Secondary | ICD-10-CM

## 2017-10-20 DIAGNOSIS — I739 Peripheral vascular disease, unspecified: Secondary | ICD-10-CM

## 2017-10-20 DIAGNOSIS — G4733 Obstructive sleep apnea (adult) (pediatric): Secondary | ICD-10-CM

## 2017-10-20 LAB — BASIC METABOLIC PANEL
Anion gap: 7 (ref 5–15)
BUN: 17 mg/dL (ref 8–23)
CALCIUM: 8.9 mg/dL (ref 8.9–10.3)
CO2: 38 mmol/L — ABNORMAL HIGH (ref 22–32)
Chloride: 95 mmol/L — ABNORMAL LOW (ref 98–111)
Creatinine, Ser: 1.03 mg/dL — ABNORMAL HIGH (ref 0.44–1.00)
GFR calc Af Amer: >60 mL/min (ref >60)
GFR, EST NON AFRICAN AMERICAN: 57 mL/min — AB (ref >60)
Glucose, Bld: 175 mg/dL — ABNORMAL HIGH (ref 70–99)
POTASSIUM: 3.6 mmol/L (ref 3.5–5.1)
SODIUM: 140 mmol/L (ref 135–145)

## 2017-10-20 MED ORDER — METOLAZONE 5 MG PO TABS
2.5000 mg | ORAL_TABLET | Freq: Once | ORAL | Status: AC
  Administered 2017-10-20: 2.5 mg via ORAL
  Filled 2017-10-20: qty 1

## 2017-10-20 MED ORDER — TRAZODONE HCL 50 MG PO TABS
25.0000 mg | ORAL_TABLET | Freq: Once | ORAL | Status: AC | PRN
  Administered 2017-10-20: 25 mg via ORAL
  Filled 2017-10-20: qty 1

## 2017-10-20 NOTE — Progress Notes (Signed)
   Notes reviewed. Agree with plan.  Please let us know if assistance is needed over weekend.  Will follow in chart.  Candee Furbish, MD

## 2017-10-20 NOTE — Progress Notes (Signed)
VASCULAR LAB PRELIMINARY  ARTERIAL  ABI completed:    RIGHT    LEFT    PRESSURE WAVEFORM  PRESSURE WAVEFORM  BRACHIAL 114 Triphasic BRACHIAL 114 Triphasic  DP 96 triphasic DP 50 monophasic         PT 99 triphasic PT 45 monophasic         GREAT TOE  NA GREAT TOE  NA    RIGHT LEFT  ABI 0.87 0.44   Resting right ABI indicates mild right lower extremity arterial disease.  Resting left ABI indicates severe left lower extremity arterial disease.  Crystal Yates 10/20/2017, 2:06 PM

## 2017-10-20 NOTE — Discharge Summary (Signed)
Name: Crystal Yates MRN: [id number] DOB: 1955/04/30 62 y.o. PCP: Dortha Kern, PA  Date of Admission: 10/17/2017  5:30 PM Date of Discharge: 10/21/2017 Attending Physician: Dr. Aldine Contes  Discharge Diagnosis: 1. Pseudoaneurysm of Right Femoral Artery  2. CHF 3. HTN 4. PAD  Discharge Medications: Allergies as of 10/21/2017      Reactions   Hydroxychloroquine Shortness Of Breath, Nausea Only, Other (See Comments)   Dizziness (also)   Donepezil Other (See Comments)   Dizziness, depression, and makes the patient feel "funny"   Prednisone Other (See Comments)   Causes depression and suicidal thoughts   Bupropion Other (See Comments)   Suicidal thoughts   Metrizamide Other (See Comments)   (a non-ionic radiopaque contrast agent) "Blows the vein" and contrast gathers at the injected site's limb   Varenicline Other (See Comments)   Suicidal thoughts   Tape Rash, Other (See Comments)   Paper tape is preferred, PLEASE      Medication List    STOP taking these medications   carvedilol 3.125 MG tablet Commonly known as:  COREG   doxycycline 100 MG capsule Commonly known as:  VIBRAMYCIN   furosemide 80 MG tablet Commonly known as:  LASIX   isosorbide mononitrate 60 MG 24 hr tablet Commonly known as:  IMDUR   sacubitril-valsartan 24-26 MG Commonly known as:  ENTRESTO     TAKE these medications   acetaminophen 325 MG tablet Commonly known as:  TYLENOL Take 2 tablets (650 mg total) by mouth every 4 (four) hours as needed for headache or mild pain.   aspirin 81 MG chewable tablet Chew 1 tablet (81 mg total) by mouth daily.   atorvastatin 80 MG tablet Commonly known as:  LIPITOR Take 1 tablet (80 mg total) by mouth daily at 6 PM.   CALCIUM PO Take 1 tablet by mouth daily.   clopidogrel 75 MG tablet Commonly known as:  PLAVIX Take 75 mg by mouth daily.   DULoxetine 60 MG capsule Commonly known as:  CYMBALTA Take 60 mg by mouth once a day     EMERGEN-C IMMUNE PO Take 1 capsule by mouth daily. CHEW   EMGALITY 120 MG/ML Soaj Generic drug:  Galcanezumab-gnlm Inject 120 mg into the skin every 30 (thirty) days.   FIASP FLEXTOUCH 100 UNIT/ML Sopn Generic drug:  Insulin Aspart (w/Niacinamide) Inject 2-14 Units into the skin 3 (three) times daily before meals. Per sliding scale   fludrocortisone 0.1 MG tablet Commonly known as:  FLORINEF Take 0.1 mg by mouth 2 (two) times daily.   FUSION PLUS Caps Take 1 capsule by mouth daily.   hydrocortisone 5 MG tablet Commonly known as:  CORTEF Take 5-15 mg by mouth See admin instructions. Take 15 mg by mouth in the morning and 5 mg in the afternoon   levothyroxine 150 MCG tablet Commonly known as:  SYNTHROID, LEVOTHROID Take 150 mcg by mouth daily before breakfast.   Magnesium 250 MG Tabs Take 250 mg by mouth daily.   nitroGLYCERIN 0.4 MG SL tablet Commonly known as:  NITROSTAT Place 0.4 mg under the tongue every 5 (five) minutes as needed for chest pain.   oxybutynin 15 MG 24 hr tablet Commonly known as:  DITROPAN XL Take 15 mg by mouth at bedtime.   Oxycodone HCl 10 MG Tabs Take 0.5-1 tablets (5-10 mg total) by mouth every 6 (six) hours as needed.   OZEMPIC 0.25 or 0.5 MG/DOSE Sopn Generic drug:  Semaglutide Inject 0.5 mg into the  skin every 30 (thirty) days.   potassium chloride SA 20 MEQ tablet Commonly known as:  K-DUR,KLOR-CON Take 1 tablet (20 mEq total) by mouth daily.   pramipexole 1 MG tablet Commonly known as:  MIRAPEX Take 1 mg by mouth 2 (two) times daily.   torsemide 20 MG tablet Commonly known as:  DEMADEX Take 2 tablets (40 mg total) by mouth 2 (two) times daily.   TRELEGY ELLIPTA 100-62.5-25 MCG/INH Aepb Generic drug:  Fluticasone-Umeclidin-Vilant Inhale 1 puff into the lungs daily.   TRESIBA FLEXTOUCH 100 UNIT/ML Sopn FlexTouch Pen Generic drug:  insulin degludec Inject 40 Units into the skin at bedtime.   TRULICITY 1.5 WI/0.9BD  Sopn Generic drug:  Dulaglutide Inject 1.5 mg into the skin every Sunday.   Vitamin D3 2000 units capsule Take 2,000 Units by mouth daily.       Disposition and follow-up:   Crystal Yates was discharged from [facility] in Stable condition.  At the hospital follow up visit please address:  1.   Pseudoaneurysm, R femoral artery: --is the hematoma improving, is she having symptoms of anemia? --repeat CBC  CHF: --changed lasix 80mg  BID to torsemide 40mg  BID; discharge weight 229lbs --held entresto and coreg during admission due to acute anemia; held at discharge as SBPs in 110-120s; please assess for ability to restart  PAD: ABI's obtained prior to consideration for UNA compression boots, however they showed mild-mod RLE PAD and mod-severe LLE PAD. Already on asa, plavix. She does not have claudication symptoms.  2.  Labs / imaging needed at time of follow-up: CBC, Bmet  3.  Pending labs/ test needing follow-up: n/a  Follow-up Appointments: Follow-up Information    Dortha Kern, [place]. Schedule an appointment as soon as possible for a visit in 1 week(s).   Specialty:  Physician Assistant Contact information: [address]  [address] [place] [address] [phone]        Park Liter, MD .   Specialty:  Cardiology Contact information: [place] [address] [phone]           Hospital Course by problem list: Crystal Yates is a 62 year old female with a PMH of DM on insulin, HTN, ischemic cardiomyopathy, chronic combined systolic and diastolic HF(last echo Jan 2019 showed EF of 40%, and , COPD, CAD s/p angioplasty for ISR of dRCA on 7/19 who presented on 7/23  with right groin pain and swelling due to right femoral pseudoaneurysm.   1. Right femoral pseudoaneurysm:  Before her presentation the patient had a sudden onset right groin pain and swelling, s/p PCI via the right femoral artery on 7/19. A duplex  ultrasound at the time of admission showed a small right femoral pseudoaneurysm and a large surrounding hematoma. Patient had conscious sedation and ultrasound-guided compression of her pseudoaneurysm. Post compression ultrasound showed resolution of the pseudoaneurysm. Vascular surgery assessed the patient at the beginning of her stay in the hospital and determined there was no further need for surgical intervention.    2. Acute on chronic diastolic and systolic heart failure: On presentation the patient had SOB with orthopnea and DOE, wheezing, and bilateral LE edema that seems to be worsening. She has an 18 lb weight gain since last month after being discharged following an acute on chronic heart failure exacerbation. Patient is on 80 mg BID lasix at home. The patient was managed with IV diuresis with 120 mg Lasix BID and one day of metolazone 2.5mg  once with  good diuresis. It was apparent that she was taking in more than recommended fluid and salt intake at home and there was a question whether she was taking the lasix correctly. On discharge she was switched to torsemide 40mg  BID, and continued on potassium supplementation. Labs on discharge showed stable Cr (~1) and potassium weight at discharge was 229lbs.   3. Anemia: On the second day of hospitalization, the patient was found to have a Hg of 7.6 and because it is recommended for HF patients to remain above 8, she was transfused. Post transfusion Hg was 8.6. This remained stable throughout the patient's admission.  4. CAD: Patient had a NSTEMI in July 2017. She got a PCI of her right coronary artery on 7/19 after stent restenosis. On admission, she admitted to not taking her ASA. She was followed by cardiology throughout the admission and counseled on the importance of compliance with these medications   5. DM type 2: Patients glucose on admission was 120. She is on trulicity, semaglutide, insulin 40 units at bedtime, and insulin aspart 2-14 TID  with meals at home. Home medications were held during the admission and the patient was initiated on Lantus 10 qhs waith SSI and mealtime insulin. On day 3 of her hospitalization, the patient's blood sugars were noted to be in the upper 200s. However, the patient's Lasix was noted to be formulated in D50. Changing the D50 based formula to NS allowed the patient's sugars to be maintained <200 for the majority of the rest of the admission  6. COPD: Patient has a history of COPD. She did not exhibit wheezing on exam and her presentation appeared to be more of an exacerbation of CHF than COPD. Home BREO and Incruse were continued throughout admission and  -BREO and Incruse were continued throughout the admission. Duo-nebs were available as a prn but not used by the patient.   7. Hyperthyroidism: The patient's last TSH was 2.218 on 6/22. She was continued on her home dose of Levothyroxine during this admission.    8. Chronic back pain: Patient's home Cymbalta was continued for this condition   9. Secondary adrenal insufficiency: This is a chronic condition for which the patient take hydrocortisone 15mg  qQM and 5 mg qPM. However, on admission the patient had soft Bps and her doses were increased to 20mg  AM and 10mg  PM to support her BP in the case of adrenal insufficiency when confronted with physiologic insult. She was resumed on her home florinef and hydrocortisone on discharge (after 3 days of "sick day" stress dosing).  10. EtOH use: On admission, the patient reported drinking everyday, with 1 drink per day. She was place on CIWA protocol but never showed any clinical signs of alcohol withdrawal.   Discharge Vitals:   BP (!) 87/50 (BP Location: Right Arm)   Pulse 78   Temp 98 F (36.7 C) (Oral)   Resp 20   Ht 5\' 2"  (1.575 m)   Wt 229 lb 9.6 oz (104.1 kg)   SpO2 92%   BMI 41.99 kg/m   Pertinent Labs, Studies, and Procedures:   10/18/17 Chest X-ray: COPD. No pneumonia, CHF, nor other  acute cardiopulmonary abnormality.  10/18/17 Vas [place] Lower Extremity Arterial Duplex Right: Right: There is no evidence of flow into the pseudocaneuysm. Pseudoaneurym remains thrombosed.  10/19/17 Echo Complete:  Left ventricle: The cavity size was normal. There was mild focal   basal hypertrophy of the septum. Systolic function was normal.   The estimated ejection fraction  was in the range of 60% to 65%.   Wall motion was normal; there were no regional wall motion   abnormalities. Doppler parameters are consistent with abnormal   left ventricular relaxation (grade 1 diastolic dysfunction).   Doppler parameters are consistent with indeterminate ventricular   filling pressure. - Aortic valve: Transvalvular velocity was within the normal range.   There was no stenosis. There was no regurgitation. - Mitral valve: Transvalvular velocity was within the normal range.   There was no evidence for stenosis. There was trivial   regurgitation. - Right ventricle: The cavity size was normal. Wall thickness was   normal. Systolic function was normal. - Tricuspid valve: There was trivial regurgitation.  10/20/17 VAS [place] ABI:  -Right: Resting right ankle-brachial index indicates mild right lower extremity arterial disease. -Left: Resting left ankle-brachial index indicates severe left lower extremity arterial disease   Discharge Instructions: Discharge Instructions    (HEART FAILURE PATIENTS) Call MD:  Anytime you have any of the following symptoms: 1) 3 pound weight gain in 24 hours or 5 pounds in 1 week 2) shortness of breath, with or without a dry hacking cough 3) swelling in the hands, feet or stomach 4) if you have to sleep on extra pillows at night in order to breathe.   Complete by:  As directed    Call MD for:  difficulty breathing, headache or visual disturbances   Complete by:  As directed    Call MD for:  extreme fatigue   Complete by:  As directed    Call MD for:  hives   Complete by:   As directed    Call MD for:  persistant dizziness or light-headedness   Complete by:  As directed    Call MD for:  persistant nausea and vomiting   Complete by:  As directed    Call MD for:  redness, tenderness, or signs of infection (pain, swelling, redness, odor or green/yellow discharge around incision site)   Complete by:  As directed    Call MD for:  severe uncontrolled pain   Complete by:  As directed    Call MD for:  temperature >100.4   Complete by:  As directed    Diet - low sodium heart healthy   Complete by:  As directed    Diet - low sodium heart healthy   Complete by:  As directed    Discharge instructions   Complete by:  As directed    For your swelling, stop taking lasix and start taking torsemide 40mg  twice daily. Limit your total fluid intake in a day to less than 60 ounces. Limit your total daily intake of salt and sodium compounds to less than 2000mg  daily.  For now, stop your entresto and coreg, until you see your cardiologist.   Increase activity slowly   Complete by:  As directed    Increase activity slowly   Complete by:  As directed       Signed: Alphonzo Grieve, MD 10/21/2017, 11:50 AM

## 2017-10-20 NOTE — Progress Notes (Signed)
Subjective:  Crystal Yates denies SOB, chest pain, cough or palpitations. She is concerned about increased swelling at the sight of the pseudoaneurysm and wants reassurance that it has not ruptured again. She also complains of pain at this site. She had a BM yesterday. The patient says she is short of breath, but hat this is her baseline.     Objective:  Vital signs in last 24 hours: Vitals:   10/19/17 1940 10/20/17 0640 10/20/17 0925 10/20/17 1130  BP: (!) 95/57 (!) 109/55  123/69  Pulse:  65 67 87  Resp: 19 12 16 18   Temp: 98.3 F (36.8 C) 98.1 F (36.7 C)    TempSrc: Oral Oral    SpO2: 96% 97% 94% 100%  Weight:  106.4 kg (234 lb 9.1 oz)    Height:       Weight change: -0.422 kg (-14.9 oz)  Intake/Output Summary (Last 24 hours) at 10/20/2017 1407 Last data filed at 10/20/2017 1200 Gross per 24 hour  Intake 960 ml  Output 1500 ml  Net -540 ml   Physical Exam: General: no acute distress  Pulm: no increased work of breathing, fine crackles, lungs predominantly CAB  CV: RRR, nMRG  Abd: hematoma evident extending from right hip to RLQ abdomen, circled by black marker, it is within the bounds of this demarcation and appears to be receding by ~3cm, mild tenderness over hematoma, rest of abdomen, soft, NTND Ext: 4+ pitting edema to knees, edema extends to thighs, pulses hard to palpate secondary to large fluid layer, extremities warm   Assessment/Plan:  Active Problems:   Pseudoaneurysm (HCC)   Pseudoaneurysm following procedure (HCC)  #Oliguria without AKI:  Patient with inconsistent response to diuresis but some urinary output without increase in Cr to suggest AKI. Still evidence of peripheral edema, but lungs do not suggest pulmonary edema and patient states that she is at her baseline respiratory status.   -Incidentally noted cysts vs. Solid mass on Renal US- follow with outpatient CT/ MRI -We will assess ABIs today to determine if patient will be a good candidate for  compression stockings to help with fluid on legs  -120 Lasix IV BID + Metolazone 2.5 mg -strict I and Os and limit fluid intake <2L  #Pseudoaneurysm #Acute blood loss anemia  Patient continues to report severe pain at the sight of the pseudoaneurysm. Today she is concerned that the hematoma is expanding again, but this is not evident on exam -Vascular surgery has signed off, We will consult if concern regarding pseudoaneurysm  -Oxycodone 10 mg IR q4h prn   #Chronic combined Diastolic and Systolic Heart Failure:  The patient's reported dry weight is 213 and she is 23 now, down one lb from yeseterday. We have attempted to manage volume status with diuresis with some response. Echo does not suggest progression of HF as an explanation for difficulty with diuresis. -Lasix 120 mg IV BID+ Metolazone 2.5 mg today  -Hold home Imdur, entresto, and carvedilol -Daily weights and I and Os  -Echo to assess for CHF progression   #CAD with PCI: Patient with history of MI in 2017 had PCI 07/19 for stent re-thrombosis after non-compliance with dual anti-platelet therapy.  -Cardiology following- appreciate their recs  -continue plavix and ASA   #Secondary Adrenal insufficiency:  Doubled home dose of hydrocortisone in setting of acute stress. BP now stable -20 mg in AM and 10 mg in afternoon  -consider return to home dosage if BP remains stable   #Type 2  Diabetes  CBG in 150s since switching Lasix from D50 to NS.  -Insulin Glargine 10 units qhs  -Novolog with meals and SSI   #COPD No wheezing on exam. Does not appear to be part of exacerbation -continue home BREO and Incruse  -Duonebs   #Hyperthyroidism  Continue home Levothyroxine 150 mg before breakfast   #ETOH The patient has a history of daily use and is on the CIWA protocal  -no signs of acute withdrawal   FEN: No fluids, replete lytes prn, NPO VTE ppx: SCDs Code Status: FULL    LOS: 2 days   Rhetta Mura, Medical  Student 10/20/2017, 2:07 PM  [phone]

## 2017-10-21 ENCOUNTER — Other Ambulatory Visit: Payer: Self-pay | Admitting: Internal Medicine

## 2017-10-21 DIAGNOSIS — E039 Hypothyroidism, unspecified: Secondary | ICD-10-CM

## 2017-10-21 DIAGNOSIS — E8770 Fluid overload, unspecified: Secondary | ICD-10-CM

## 2017-10-21 LAB — BASIC METABOLIC PANEL
ANION GAP: 10 (ref 5–15)
BUN: 18 mg/dL (ref 8–23)
CALCIUM: 9.6 mg/dL (ref 8.9–10.3)
CO2: 39 mmol/L — ABNORMAL HIGH (ref 22–32)
Chloride: 91 mmol/L — ABNORMAL LOW (ref 98–111)
Creatinine, Ser: 1.11 mg/dL — ABNORMAL HIGH (ref 0.44–1.00)
GFR calc non Af Amer: 52 mL/min — ABNORMAL LOW (ref >60)
Glucose, Bld: 119 mg/dL — ABNORMAL HIGH (ref 70–99)
Potassium: 3.9 mmol/L (ref 3.5–5.1)
Sodium: 140 mmol/L (ref 135–145)

## 2017-10-21 LAB — GLUCOSE, CAPILLARY: GLUCOSE-CAPILLARY: 259 mg/dL — AB (ref 70–99)

## 2017-10-21 LAB — CBC
HCT: 29.1 % — ABNORMAL LOW (ref 36.0–46.0)
Hemoglobin: 9.1 g/dL — ABNORMAL LOW (ref 12.0–15.0)
MCH: 30.1 pg (ref 26.0–34.0)
MCHC: 31.3 g/dL (ref 30.0–36.0)
MCV: 96.4 fL (ref 78.0–100.0)
PLATELETS: 201 10*3/uL (ref 150–400)
RBC: 3.02 MIL/uL — ABNORMAL LOW (ref 3.87–5.11)
RDW: 15.7 % — ABNORMAL HIGH (ref 11.5–15.5)
WBC: 9.9 10*3/uL (ref 4.0–10.5)

## 2017-10-21 MED ORDER — OXYCODONE HCL 10 MG PO TABS: 5.0000 mg | ORAL_TABLET | Freq: Four times a day (QID) | ORAL | 0 refills | Status: DC | PRN

## 2017-10-21 MED ORDER — TORSEMIDE 20 MG PO TABS: 40.0000 mg | ORAL_TABLET | Freq: Two times a day (BID) | ORAL | 1 refills | Status: DC

## 2017-10-21 NOTE — Progress Notes (Signed)
   Subjective:  Patient states she had a significant urine output yesterday and overnight. She denies chest pain, shortness of breath. She still has LE swelling but says its improved. She is having some mild numbness in her left anterior thigh that started last night and continues to have some pain at the site of her hematoma.  She is eager about going home. We discussed with her and her husband about low sodium diet and fluid restriction.  Objective:  Vital signs in last 24 hours: Vitals:   10/20/17 1130 10/20/17 2139 10/21/17 0320 10/21/17 0849  BP: 123/69 (!) 111/51 127/68   Pulse: 87 72 78   Resp: 18 14 20    Temp:  98.1 F (36.7 C) 97.6 F (36.4 C)   TempSrc:  Oral Oral   SpO2: 100% 97% 99% 92%  Weight:   229 lb 9.6 oz (104.1 kg)   Height:       Constitutional: NAD CV: RRR Resp: CTAB, no increased work of breathing Abd: soft; stable ecchymosis on RLQ with some TTP; +BS, ND Ext: bil soft, 2+ pitting edema bilaterally   Assessment/Plan:  Active Problems:   Pseudoaneurysm (HCC)   Pseudoaneurysm following procedure (HCC)  Pseudoaneurysm of R femoral artery Acute blood loss anemia: Pseudoaneurysm resolve; hematoma resolving and stable. Hgb stable after 1u pRBCs at 9.1.   Acute on chronic combined systolic and diastolic CHF: Echo this admission shows EF 60-65%, G1DD. She initially did not respond to IV lasix 80mg  on admission, however has had stable diuresis since then. It was apparent yesterday that she was taking in a lot more PO fluids than advised. She is net negative 2L in last 24hrs with IV lasix 120mg  BID and single dose of metolazone 2.5mg ; weight down to 229lbs. She is eager to go home; she is in agreement with close follow up this week with her PCP and cardiologist. We discussed at length fluid restriction and salt restriction. Due to PAD, she is not candidate for compression.  --switch home lasix to torsemide 40mg  BID --continue holding imdur, entresto, and  carvedilol on discharge due to SBP 110-120s --close f/u with PCP and cardiology this week for Bmet  H/o CAD with PCI: H/o MI in 2017 s/p stenting with balloon angiography 09/2017 for stent rethrombosis. --Plavix and asa  Secondary adrenal insufficiency: Was stress dosed (double home dose of hydrocortisone) for 3 days. On discharge she will resume her home fludrocortisone and hydrocortisone dosing.  T2DM: Discharge on home meds.  COPD: Stable. Discharge on home Trelegy  Hypothyroidism: Home levothyroxine 150mg  daily  EtOH use: Did not require ativan while inpatient.   Dispo: Anticipated discharge today.   Alphonzo Grieve, MD 10/21/2017, 9:23 AM Pager [phone]

## 2017-10-21 NOTE — Progress Notes (Signed)
Patient is ready for discharge, she has all of her belongings with her. She is alert and oriented and her vital signs are stable. Patient has had all of her questions regarding her discharge answered. She has been taken off of telemetry and CCMD has been notified, IV has been DC'd, catheter intact and patient tolerated well. Patient will be transported home by her husband who is here with her now. Patient will leave unit by wheelchair and meet husband at the front entrance

## 2017-10-21 NOTE — Progress Notes (Signed)
Pt states that she has numbness in her right upper thigh. Pulses are doppler able.  Will continue to monitor.  Lupita Dawn, RN

## 2017-10-21 NOTE — Progress Notes (Signed)
PT Cancellation Note  Patient Details Name: Crystal Yates MRN: [id number] DOB: May 10, 1955   Cancelled Treatment:     Patient declines need for acute PT evaluation, observed ambulating in room without AD. Pt is set up with OP physical therapy and will resume sessions after discharge. Pt and husband report confidence with safety and that she Is moving near baseline today. PT will sign off, please reorder if anything should change.   Reinaldo Berber, PT, DPT Acute Rehab Services Pager: [phone]     Reinaldo Berber 10/21/2017, 2:42 PM

## 2017-10-23 ENCOUNTER — Encounter (HOSPITAL_COMMUNITY): Payer: Self-pay | Admitting: *Deleted

## 2017-10-23 ENCOUNTER — Inpatient Hospital Stay (HOSPITAL_COMMUNITY)
Admission: EM | Admit: 2017-10-23 | Discharge: 2017-10-26 | Disposition: A | Discharge status: Home / Self Care | Payer: Federal, State, Local not specified - PPO | Source: Home / Self Care | Attending: Internal Medicine | Admitting: Internal Medicine

## 2017-10-23 ENCOUNTER — Emergency Department (HOSPITAL_COMMUNITY): Payer: Federal, State, Local not specified - PPO

## 2017-10-23 ENCOUNTER — Other Ambulatory Visit: Payer: Self-pay

## 2017-10-23 DIAGNOSIS — Z91048 Other nonmedicinal substance allergy status: Secondary | ICD-10-CM

## 2017-10-23 DIAGNOSIS — F17210 Nicotine dependence, cigarettes, uncomplicated: Secondary | ICD-10-CM

## 2017-10-23 DIAGNOSIS — M79609 Pain in unspecified limb: Secondary | ICD-10-CM

## 2017-10-23 DIAGNOSIS — Z79899 Other long term (current) drug therapy: Secondary | ICD-10-CM

## 2017-10-23 DIAGNOSIS — I509 Heart failure, unspecified: Secondary | ICD-10-CM

## 2017-10-23 DIAGNOSIS — X58XXXD Exposure to other specified factors, subsequent encounter: Secondary | ICD-10-CM

## 2017-10-23 DIAGNOSIS — E039 Hypothyroidism, unspecified: Secondary | ICD-10-CM

## 2017-10-23 DIAGNOSIS — N182 Chronic kidney disease, stage 2 (mild): Secondary | ICD-10-CM

## 2017-10-23 DIAGNOSIS — Z955 Presence of coronary angioplasty implant and graft: Secondary | ICD-10-CM

## 2017-10-23 DIAGNOSIS — Z7902 Long term (current) use of antithrombotics/antiplatelets: Secondary | ICD-10-CM

## 2017-10-23 DIAGNOSIS — J449 Chronic obstructive pulmonary disease, unspecified: Secondary | ICD-10-CM

## 2017-10-23 DIAGNOSIS — I5033 Acute on chronic diastolic (congestive) heart failure: Secondary | ICD-10-CM

## 2017-10-23 DIAGNOSIS — I2510 Atherosclerotic heart disease of native coronary artery without angina pectoris: Secondary | ICD-10-CM

## 2017-10-23 DIAGNOSIS — E876 Hypokalemia: Secondary | ICD-10-CM

## 2017-10-23 DIAGNOSIS — E11649 Type 2 diabetes mellitus with hypoglycemia without coma: Secondary | ICD-10-CM

## 2017-10-23 DIAGNOSIS — I771 Stricture of artery: Secondary | ICD-10-CM

## 2017-10-23 DIAGNOSIS — I5023 Acute on chronic systolic (congestive) heart failure: Secondary | ICD-10-CM

## 2017-10-23 DIAGNOSIS — I255 Ischemic cardiomyopathy: Secondary | ICD-10-CM

## 2017-10-23 DIAGNOSIS — Z794 Long term (current) use of insulin: Secondary | ICD-10-CM

## 2017-10-23 DIAGNOSIS — I4581 Long QT syndrome: Secondary | ICD-10-CM

## 2017-10-23 DIAGNOSIS — I110 Hypertensive heart disease with heart failure: Secondary | ICD-10-CM

## 2017-10-23 DIAGNOSIS — Z7952 Long term (current) use of systemic steroids: Secondary | ICD-10-CM

## 2017-10-23 DIAGNOSIS — E1151 Type 2 diabetes mellitus with diabetic peripheral angiopathy without gangrene: Secondary | ICD-10-CM

## 2017-10-23 DIAGNOSIS — Z7982 Long term (current) use of aspirin: Secondary | ICD-10-CM

## 2017-10-23 DIAGNOSIS — Z8679 Personal history of other diseases of the circulatory system: Secondary | ICD-10-CM

## 2017-10-23 DIAGNOSIS — S301XXD Contusion of abdominal wall, subsequent encounter: Secondary | ICD-10-CM

## 2017-10-23 DIAGNOSIS — Z9981 Dependence on supplemental oxygen: Secondary | ICD-10-CM

## 2017-10-23 DIAGNOSIS — Z79890 Hormone replacement therapy (postmenopausal): Secondary | ICD-10-CM

## 2017-10-23 DIAGNOSIS — I724 Aneurysm of artery of lower extremity: Secondary | ICD-10-CM

## 2017-10-23 DIAGNOSIS — Z833 Family history of diabetes mellitus: Secondary | ICD-10-CM

## 2017-10-23 DIAGNOSIS — Z888 Allergy status to other drugs, medicaments and biological substances status: Secondary | ICD-10-CM

## 2017-10-23 HISTORY — DX: Dependence on supplemental oxygen: Z99.81

## 2017-10-23 LAB — URINALYSIS, ROUTINE W REFLEX MICROSCOPIC
Bilirubin Urine: NEGATIVE
Glucose, UA: NEGATIVE mg/dL
Hgb urine dipstick: NEGATIVE
Ketones, ur: NEGATIVE mg/dL
Leukocytes, UA: NEGATIVE
Nitrite: NEGATIVE
Protein, ur: NEGATIVE mg/dL
Specific Gravity, Urine: 1.006 (ref 1.005–1.030)
pH: 8 (ref 5.0–8.0)

## 2017-10-23 LAB — GLUCOSE, CAPILLARY
Glucose-Capillary: 87 mg/dL (ref 70–99)
Glucose-Capillary: 123 mg/dL — ABNORMAL HIGH (ref 70–99)

## 2017-10-23 LAB — COMPREHENSIVE METABOLIC PANEL
ALT: 23 U/L (ref 0–44)
AST: 29 U/L (ref 15–41)
Albumin: 3 g/dL — ABNORMAL LOW (ref 3.5–5.0)
Alkaline Phosphatase: 72 U/L (ref 38–126)
Anion gap: 14 (ref 5–15)
BUN: 17 mg/dL (ref 8–23)
CO2: 44 mmol/L — ABNORMAL HIGH (ref 22–32)
Calcium: 9.6 mg/dL (ref 8.9–10.3)
Chloride: 84 mmol/L — ABNORMAL LOW (ref 98–111)
Creatinine, Ser: 1.23 mg/dL — ABNORMAL HIGH (ref 0.44–1.00)
GFR calc Af Amer: 53 mL/min — ABNORMAL LOW (ref >60)
GFR calc non Af Amer: 46 mL/min — ABNORMAL LOW (ref >60)
Glucose, Bld: 149 mg/dL — ABNORMAL HIGH (ref 70–99)
Potassium: 2.5 mmol/L — CL (ref 3.5–5.1)
Sodium: 142 mmol/L (ref 135–145)
Total Bilirubin: 1 mg/dL (ref 0.3–1.2)
Total Protein: 6.4 g/dL — ABNORMAL LOW (ref 6.5–8.1)

## 2017-10-23 LAB — CBC WITH DIFFERENTIAL/PLATELET
ABS IMMATURE GRANULOCYTES: 0.1 10*3/uL (ref 0.0–0.1)
Basophils Absolute: 0 10*3/uL (ref 0.0–0.1)
Basophils Relative: 0 %
Eosinophils Absolute: 0.2 10*3/uL (ref 0.0–0.7)
Eosinophils Relative: 2 %
HEMATOCRIT: 33.1 % — AB (ref 36.0–46.0)
HEMOGLOBIN: 10.4 g/dL — AB (ref 12.0–15.0)
Immature Granulocytes: 1 %
LYMPHS PCT: 14 %
Lymphs Abs: 1.7 10*3/uL (ref 0.7–4.0)
MCH: 30.7 pg (ref 26.0–34.0)
MCHC: 31.4 g/dL (ref 30.0–36.0)
MCV: 97.6 fL (ref 78.0–100.0)
MONO ABS: 0.8 10*3/uL (ref 0.1–1.0)
MONOS PCT: 7 %
Neutro Abs: 9.1 10*3/uL — ABNORMAL HIGH (ref 1.7–7.7)
Neutrophils Relative %: 76 %
Platelets: 265 10*3/uL (ref 150–400)
RBC: 3.39 MIL/uL — ABNORMAL LOW (ref 3.87–5.11)
RDW: 15.7 % — ABNORMAL HIGH (ref 11.5–15.5)
WBC: 12 10*3/uL — ABNORMAL HIGH (ref 4.0–10.5)

## 2017-10-23 LAB — BASIC METABOLIC PANEL
ANION GAP: 13 (ref 5–15)
BUN: 16 mg/dL (ref 8–23)
CHLORIDE: 85 mmol/L — AB (ref 98–111)
CO2: 45 mmol/L — AB (ref 22–32)
Calcium: 9.3 mg/dL (ref 8.9–10.3)
Creatinine, Ser: 1.11 mg/dL — ABNORMAL HIGH (ref 0.44–1.00)
GFR calc non Af Amer: 52 mL/min — ABNORMAL LOW (ref >60)
Glucose, Bld: 80 mg/dL (ref 70–99)
Potassium: 2.6 mmol/L — CL (ref 3.5–5.1)
Sodium: 143 mmol/L (ref 135–145)

## 2017-10-23 LAB — CBG MONITORING, ED
GLUCOSE-CAPILLARY: 144 mg/dL — AB (ref 70–99)
GLUCOSE-CAPILLARY: 151 mg/dL — AB (ref 70–99)
Glucose-Capillary: 143 mg/dL — ABNORMAL HIGH (ref 70–99)
Glucose-Capillary: 112 mg/dL — ABNORMAL HIGH (ref 70–99)

## 2017-10-23 LAB — I-STAT CG4 LACTIC ACID, ED
LACTIC ACID, VENOUS: 2.13 mmol/L — AB (ref 0.5–1.9)
Lactic Acid, Venous: 1 mmol/L (ref 0.5–1.9)

## 2017-10-23 LAB — BRAIN NATRIURETIC PEPTIDE: B Natriuretic Peptide: 154.5 pg/mL — ABNORMAL HIGH (ref 0.0–100.0)

## 2017-10-23 LAB — MAGNESIUM
MAGNESIUM: 2 mg/dL (ref 1.7–2.4)
Magnesium: 1.4 mg/dL — ABNORMAL LOW (ref 1.7–2.4)

## 2017-10-23 LAB — TROPONIN I: Troponin I: <0.03 ng/mL (ref <0.03)

## 2017-10-23 MED ORDER — CLOPIDOGREL BISULFATE 75 MG PO TABS
75.0000 mg | ORAL_TABLET | Freq: Every day | ORAL | Status: DC
  Administered 2017-10-23 – 2017-10-25 (×3): 75 mg via ORAL
  Filled 2017-10-23 (×3): qty 1

## 2017-10-23 MED ORDER — HYDROCORTISONE 20 MG PO TABS
30.0000 mg | ORAL_TABLET | Freq: Every day | ORAL | Status: DC
  Administered 2017-10-23 – 2017-10-25 (×3): 30 mg via ORAL
  Filled 2017-10-23 (×4): qty 1

## 2017-10-23 MED ORDER — FLUTICASONE-UMECLIDIN-VILANT 100-62.5-25 MCG/INH IN AEPB: 1.0000 | INHALATION_SPRAY | Freq: Every day | RESPIRATORY_TRACT | Status: DC

## 2017-10-23 MED ORDER — FUROSEMIDE 10 MG/ML IJ SOLN
80.0000 mg | Freq: Once | INTRAMUSCULAR | Status: AC
  Administered 2017-10-23: 80 mg via INTRAVENOUS
  Filled 2017-10-23: qty 8

## 2017-10-23 MED ORDER — INSULIN ASPART 100 UNIT/ML ~~LOC~~ SOLN
0.0000 [IU] | SUBCUTANEOUS | Status: DC
  Administered 2017-10-23 – 2017-10-24 (×4): 1 [IU] via SUBCUTANEOUS

## 2017-10-23 MED ORDER — FLUDROCORTISONE ACETATE 0.1 MG PO TABS
0.1000 mg | ORAL_TABLET | Freq: Two times a day (BID) | ORAL | Status: DC
  Administered 2017-10-23 – 2017-10-25 (×6): 0.1 mg via ORAL
  Filled 2017-10-23 (×7): qty 1

## 2017-10-23 MED ORDER — POTASSIUM CHLORIDE 10 MEQ/100ML IV SOLN
10.0000 meq | INTRAVENOUS | Status: DC
  Administered 2017-10-23 (×3): 10 meq via INTRAVENOUS
  Filled 2017-10-23 (×3): qty 100

## 2017-10-23 MED ORDER — PRAMIPEXOLE DIHYDROCHLORIDE 1 MG PO TABS
1.0000 mg | ORAL_TABLET | Freq: Two times a day (BID) | ORAL | Status: DC
  Administered 2017-10-23 – 2017-10-25 (×5): 1 mg via ORAL
  Filled 2017-10-23 (×6): qty 1

## 2017-10-23 MED ORDER — ACETAMINOPHEN 650 MG RE SUPP: 650.0000 mg | Freq: Four times a day (QID) | RECTAL | Status: DC | PRN

## 2017-10-23 MED ORDER — POTASSIUM CHLORIDE 10 MEQ/100ML IV SOLN
10.0000 meq | INTRAVENOUS | Status: AC
  Administered 2017-10-23 (×3): 10 meq via INTRAVENOUS
  Filled 2017-10-23 (×2): qty 100

## 2017-10-23 MED ORDER — BUDESONIDE 0.25 MG/2ML IN SUSP: 0.2500 mg | Freq: Every day | RESPIRATORY_TRACT | Status: DC

## 2017-10-23 MED ORDER — OXYBUTYNIN CHLORIDE ER 15 MG PO TB24
15.0000 mg | ORAL_TABLET | Freq: Every day | ORAL | Status: DC
  Administered 2017-10-23 – 2017-10-25 (×3): 15 mg via ORAL
  Filled 2017-10-23 (×3): qty 1

## 2017-10-23 MED ORDER — POTASSIUM CHLORIDE CRYS ER 20 MEQ PO TBCR
40.0000 meq | EXTENDED_RELEASE_TABLET | ORAL | Status: AC
  Administered 2017-10-23 (×2): 40 meq via ORAL
  Filled 2017-10-23 (×2): qty 2

## 2017-10-23 MED ORDER — ACETAMINOPHEN 325 MG PO TABS: 650.0000 mg | ORAL_TABLET | Freq: Four times a day (QID) | ORAL | Status: DC | PRN

## 2017-10-23 MED ORDER — POLYETHYLENE GLYCOL 3350 17 G PO PACK: 17.0000 g | PACK | Freq: Every day | ORAL | Status: DC | PRN

## 2017-10-23 MED ORDER — HYDROCODONE-ACETAMINOPHEN 10-325 MG PO TABS
1.0000 | ORAL_TABLET | Freq: Four times a day (QID) | ORAL | Status: DC | PRN
  Administered 2017-10-23 – 2017-10-25 (×7): 1 via ORAL
  Filled 2017-10-23 (×7): qty 1

## 2017-10-23 MED ORDER — UMECLIDINIUM-VILANTEROL 62.5-25 MCG/INH IN AEPB: 1.0000 | INHALATION_SPRAY | Freq: Every day | RESPIRATORY_TRACT | Status: DC

## 2017-10-23 MED ORDER — ATORVASTATIN CALCIUM 80 MG PO TABS
80.0000 mg | ORAL_TABLET | Freq: Every day | ORAL | Status: DC
  Administered 2017-10-23 – 2017-10-25 (×3): 80 mg via ORAL
  Filled 2017-10-23 (×3): qty 1

## 2017-10-23 MED ORDER — POTASSIUM CHLORIDE CRYS ER 20 MEQ PO TBCR
40.0000 meq | EXTENDED_RELEASE_TABLET | Freq: Once | ORAL | Status: AC
  Administered 2017-10-23: 40 meq via ORAL
  Filled 2017-10-23: qty 2

## 2017-10-23 MED ORDER — UMECLIDINIUM-VILANTEROL 62.5-25 MCG/INH IN AEPB
1.0000 | INHALATION_SPRAY | Freq: Every day | RESPIRATORY_TRACT | Status: DC
  Administered 2017-10-24 – 2017-10-25 (×2): 1 via RESPIRATORY_TRACT
  Filled 2017-10-23: qty 14

## 2017-10-23 MED ORDER — MAGNESIUM SULFATE 2 GM/50ML IV SOLN
2.0000 g | Freq: Once | INTRAVENOUS | Status: AC
  Administered 2017-10-23: 2 g via INTRAVENOUS
  Filled 2017-10-23: qty 50

## 2017-10-23 MED ORDER — DULOXETINE HCL 60 MG PO CPEP
60.0000 mg | ORAL_CAPSULE | Freq: Every day | ORAL | Status: DC
  Administered 2017-10-23 – 2017-10-25 (×3): 60 mg via ORAL
  Filled 2017-10-23 (×3): qty 1

## 2017-10-23 MED ORDER — LEVOTHYROXINE SODIUM 150 MCG PO TABS
150.0000 ug | ORAL_TABLET | Freq: Every day | ORAL | Status: DC
  Administered 2017-10-24 – 2017-10-25 (×2): 150 ug via ORAL
  Filled 2017-10-23 (×2): qty 2
  Filled 2017-10-23 (×3): qty 1

## 2017-10-23 MED ORDER — OXYCODONE-ACETAMINOPHEN 5-325 MG PO TABS
1.0000 | ORAL_TABLET | Freq: Once | ORAL | Status: AC
  Administered 2017-10-23: 1 via ORAL
  Filled 2017-10-23: qty 1

## 2017-10-23 MED ORDER — HYDROCORTISONE 10 MG PO TABS
10.0000 mg | ORAL_TABLET | Freq: Every day | ORAL | Status: DC
  Administered 2017-10-23 – 2017-10-25 (×3): 10 mg via ORAL
  Filled 2017-10-23 (×3): qty 1

## 2017-10-23 MED ORDER — BUDESONIDE 0.25 MG/2ML IN SUSP
0.2500 mg | Freq: Every day | RESPIRATORY_TRACT | Status: DC
  Administered 2017-10-24 – 2017-10-25 (×2): 0.25 mg via RESPIRATORY_TRACT
  Filled 2017-10-23 (×3): qty 2

## 2017-10-23 MED ORDER — UMECLIDINIUM-VILANTEROL 62.5-25 MCG/INH IN AEPB
1.0000 | INHALATION_SPRAY | Freq: Every day | RESPIRATORY_TRACT | Status: DC
  Filled 2017-10-23: qty 14

## 2017-10-23 MED ORDER — ASPIRIN 81 MG PO CHEW
81.0000 mg | CHEWABLE_TABLET | Freq: Every day | ORAL | Status: DC
  Administered 2017-10-23 – 2017-10-25 (×3): 81 mg via ORAL
  Filled 2017-10-23 (×3): qty 1

## 2017-10-23 NOTE — ED Provider Notes (Addendum)
[facility] EMERGENCY DEPARTMENT Provider Note   CSN: [id number] Arrival date & time: 10/23/17  0947     History   Chief Complaint Chief Complaint  Patient presents with  . Hypoglycemia  . Dizziness    HPI Crystal Yates is a 62 y.o. female.  HPI 62 year old female with past medical history as below here with generalized weakness.  The patient was just admitted for pseudoaneurysm of her recent femoral cath site.  She states that she had been recovering well until the last 24 hours.  Last 24 hours, she is had increasing pain in her right leg at her fistula site.  She is also had generalized weakness.  Overnight, she felt generally shaky and weak.  No focal weakness.  She took her blood sugar and it was in the 40s and 50s.  She was able to eat and feels slightly better.  She notes that she feels generally weak and tired.  No chest pain.  She is had a mild cough that is been persistent.  She has been taking new medications as prescribed.  She denies any increased swelling of her leg, only chronic, though slightly worsened aching, throbbing pain that has been present since her pseudoaneurysm.  Denies any new bruising.  Denies any new numbness or weakness of the leg.  No recent falls.  No other complaints.  Past Medical History:  Diagnosis Date  . Acute on chronic systolic heart failure exacerbation(HCC) 04/08/2016  . CAD in native artery    a. Prior LAD stenting based on cath. b. RCA stenting 03/2016 x2.  . Chronic combined systolic and diastolic CHF (congestive heart failure) ([place])   . CKD (chronic kidney disease), stage II   . COPD (chronic obstructive pulmonary disease) ([place])   . Diabetes mellitus without complication ([place])   . Hashimoto's thyroiditis   . Hyperlipidemia   . Hypertension   . Myocardial infarction ([place])   . Secondary adrenal insufficiency ([place])   . Thyroid disease   . Tobacco abuse     Patient Active Problem List   Diagnosis Date Noted    . Volume overload   . Pseudoaneurysm following procedure ([place])   . Pseudoaneurysm ([place]) 10/17/2017  . S/P angioplasty for ISR of dRCA 10/13/17 10/14/2017  . CAD (coronary artery disease) 10/13/2017  . Preop examination 10/10/2017  . Acute respiratory distress 09/16/2017  . Right upper lobe pneumonia ([place]) 05/06/2017  . Pneumonia due to respiratory syncytial virus (RSV) 04/21/2017  . Entrapment, radial nerve, right 03/31/2017  . COPD exacerbation ([place]) 10/14/2016  . Chronic combined systolic and diastolic CHF (congestive heart failure) ([place]) 10/13/2016  . Chest tightness or pressure 10/12/2016  . Dyspnea on exertion 09/29/2016  . History of coronary artery disease 09/29/2016  . OAB (overactive bladder) 09/21/2016  . Neck pain 09/08/2016  . Paresthesia of arm 09/08/2016  . Flank pain 08/31/2016  . Oxygen dependent 06/20/2016  . Neuralgia of right upper extremity 04/26/2016  . Coronary artery disease 04/16/2016  . Lymphocele after surgical procedure 04/16/2016  . Ventricular tachycardia (paroxysmal) ([place]) 04/09/2016  . Acute on chronic systolic heart failure exacerbation(HCC) 04/08/2016  . Ischemic cardiomyopathy 04/08/2016  . Type 2 diabetes mellitus without complication, with long-term current use of insulin ([place]) 04/08/2016  . Anemia, blood loss 09/16/2015  . GI bleeding 09/16/2015  . Precordial pain 09/14/2015  . Chest pain 05/22/2015  . Hypokalemia 05/22/2015  . Hypothyroid 05/22/2015  . Status post hernia repair 01/29/2015  . Surgical wound breakdown 01/29/2015  .  Hernia with strangulation 01/18/2015  . Hyperkalemia 01/18/2015  . Tobacco abuse 01/18/2015  . Recurrent incisional hernia with incarceration 01/15/2015  . Sleep apnea 01/15/2015  . S/P drug eluting coronary stent placement 10/16/2014  . Adrenal insufficiency ([place]) 11/21/2013  . Anemia 11/21/2013  . Unstable angina ([place]) 11/21/2013  . Atherosclerotic heart disease of native coronary artery without angina  pectoris 11/21/2013  . Cardiomyopathy ([place]) 11/21/2013  . Congestive heart failure ([place]) 11/21/2013  . Diabetes mellitus with no complication ([place]) 16/12/9602  . Edema 11/21/2013  . Empty sella syndrome ([place]) 11/21/2013  . Hepatitis 11/21/2013  . Hyperlipemia 11/21/2013  . Hypertension 11/21/2013  . Kidney cyst, acquired 11/21/2013  . Lymph nodes enlarged 11/21/2013  . Fat necrosis 11/21/2013  . Other emphysema ([place]) 11/21/2013  . Renal disorder 11/21/2013  . Shortness of breath 11/21/2013  . Sjogrens syndrome ([place]) 11/21/2013  . Thyroid disorder 11/21/2013  . Nicotine dependence, uncomplicated 54/11/8117  . Major depressive disorder, recurrent episode ([place]) 09/17/2013  . Displacement of lumbar intervertebral disc 06/14/2013  . Intractable migraine without aura 06/14/2013  . Mild cognitive impairment 06/14/2013  . Polyneuropathy in diabetes ([place]) 06/14/2013  . Adrenal cortex insufficiency ([place]) 04/12/2011  . DDD (degenerative disc disease), lumbosacral 01/11/2008  . Thoracic or lumbosacral neuritis or radiculitis 01/11/2008    Past Surgical History:  Procedure Laterality Date  . ABDOMINAL SURGERY    . CESAREAN SECTION    . CHOLECYSTECTOMY    . COLON RESECTION    . CORONARY BALLOON ANGIOPLASTY N/A 10/13/2017   Procedure: CORONARY BALLOON ANGIOPLASTY;  Surgeon: Belva Crome, MD;  Location: [facility];  Service: Cardiovascular;  Laterality: N/A;  . HERNIA MESH REMOVAL    . HERNIA REPAIR    . LEFT HEART CATH AND CORONARY ANGIOGRAPHY N/A 10/13/2016   Procedure: Left Heart Cath and Coronary Angiography;  Surgeon: Nelva Bush, MD;  Location: [facility];  Service: Cardiovascular;  Laterality: N/A;  . RIGHT/LEFT HEART CATH AND CORONARY ANGIOGRAPHY N/A 10/13/2017   Procedure: RIGHT/LEFT HEART CATH AND CORONARY ANGIOGRAPHY;  Surgeon: Belva Crome, MD;  Location: [facility];  Service: Cardiovascular;  Laterality: N/A;  . SHOULDER ARTHROSCOPY    . TUBAL  LIGATION       OB History   None      Home Medications    Prior to Admission medications   Medication Sig Start Date End Date Taking? Authorizing Provider  acetaminophen (TYLENOL) 325 MG tablet Take 2 tablets (650 mg total) by mouth every 4 (four) hours as needed for headache or mild pain. 10/14/17  Yes Isaiah Serge, NP  aspirin 81 MG chewable tablet Chew 1 tablet (81 mg total) by mouth daily. 10/14/17  Yes Isaiah Serge, NP  atorvastatin (LIPITOR) 80 MG tablet Take 1 tablet (80 mg total) by mouth daily at 6 PM. 10/14/17  Yes Isaiah Serge, NP  CALCIUM PO Take 1 tablet by mouth daily.   Yes [provider]  Cholecalciferol (VITAMIN D3) 2000 units capsule Take 2,000 Units by mouth daily.    Yes [provider]  clopidogrel (PLAVIX) 75 MG tablet Take 75 mg by mouth daily.   Yes [provider]  DULoxetine (CYMBALTA) 60 MG capsule Take 60 mg by mouth once a day 12/05/16  Yes [provider]  EMGALITY 120 MG/ML SOAJ Inject 120 mg into the skin every 30 (thirty) days.  07/30/17  Yes [provider]  hydrocortisone (CORTEF) 5 MG tablet Take 5-15  mg by mouth See admin instructions. Take 15 mg by mouth in the morning and 5 mg in the afternoon   Yes [provider]  Insulin Aspart, w/Niacinamide, (FIASP FLEXTOUCH) 100 UNIT/ML SOPN Inject 2-14 Units into the skin See admin instructions. Use three times day as needed before meals per sliding scale   Yes [provider]  insulin degludec (TRESIBA FLEXTOUCH) 100 UNIT/ML SOPN FlexTouch Pen Inject 40 Units into the skin at bedtime.  08/28/16  Yes [provider]  Iron-FA-B Cmp-C-Biot-Probiotic (FUSION PLUS) CAPS Take 1 capsule by mouth daily. 10/09/16  Yes [provider]  levothyroxine (SYNTHROID, LEVOTHROID) 150 MCG tablet Take 150 mcg by mouth daily before breakfast.   Yes [provider]  Magnesium 250 MG TABS Take 250 mg by mouth daily.   Yes [provider]  nitroGLYCERIN (NITROSTAT) 0.4 MG SL tablet Place 0.4 mg under the tongue every 5 (five) minutes as needed for chest pain.    Yes [provider]  oxybutynin (DITROPAN XL) 15 MG 24 hr tablet Take 15 mg by mouth at bedtime.   Yes [provider]  Oxycodone HCl 10 MG TABS Take 0.5-1 tablets (5-10 mg total) by mouth every 6 (six) hours as needed. Patient taking differently: Take 5-10 mg by mouth every 6 (six) hours as needed (pain).  10/21/17  Yes Alphonzo Grieve, MD  potassium chloride SA (K-DUR,KLOR-CON) 20 MEQ tablet Take 1 tablet (20 mEq total) by mouth daily. 03/14/17  Yes Park Liter, MD  pramipexole (MIRAPEX) 1 MG tablet Take 1 mg by mouth 2 (two) times daily.   Yes [provider]  Semaglutide (OZEMPIC) 0.25 or 0.5 MG/DOSE SOPN Inject 0.5 mg into the skin every 30 (thirty) days.    Yes [provider]  torsemide (DEMADEX) 20 MG tablet Take 2 tablets (40 mg total) by mouth 2 (two) times daily. 10/21/17  Yes Alphonzo Grieve, MD  TRELEGY ELLIPTA 100-62.5-25 MCG/INH AEPB Inhale 1 puff into the lungs daily. 08/30/17  Yes [provider]  TRULICITY 1.5 VE/9.3YB SOPN Inject 1.5 mg into the skin every Sunday.  10/10/16  Yes [provider]    Family History Family History  Problem Relation Age of Onset  . Stroke Mother   . Diabetes Father   . Diabetes Sister   . Diabetes Sister   . Diabetes Son     Social History Social History   Tobacco Use  . Smoking status: Current Every Day Smoker    Packs/day: 0.75    Years: 40.00    Pack years: 30.00    Types: Cigarettes  . Smokeless tobacco: Never Used  Substance Use Topics  . Alcohol use: Yes    Comment: 1 drink every day  . Drug use: Yes    Types: Marijuana    Comment: occ     Allergies   Hydroxychloroquine; Donepezil; Prednisone; Bupropion; Metrizamide; Varenicline; and Tape   Review of Systems Review of Systems  Constitutional: Positive for fatigue. Negative for chills  and fever.  HENT: Negative for congestion and rhinorrhea.   Eyes: Negative for visual disturbance.  Respiratory: Negative for cough, shortness of breath and wheezing.   Cardiovascular: Negative for chest pain and leg swelling.  Gastrointestinal: Positive for nausea. Negative for abdominal pain, diarrhea and vomiting.  Genitourinary: Negative for dysuria and flank pain.  Musculoskeletal: Negative for neck pain and neck stiffness.  Skin: Negative for rash and wound.  Allergic/Immunologic: Negative for immunocompromised state.  Neurological: Positive for weakness.  Negative for syncope and headaches.  All other systems reviewed and are negative.    Physical Exam Updated Vital Signs BP (!) 113/47   Pulse 84   Temp 98 F (36.7 C) (Oral)   Resp 18   SpO2 96%   Physical Exam  Constitutional: She is oriented to person, place, and time. She appears well-developed and well-nourished. No distress.  HENT:  Head: Normocephalic and atraumatic.  Eyes: Conjunctivae are normal.  Neck: Neck supple.  Cardiovascular: Normal rate, regular rhythm and normal heart sounds. Exam reveals no friction rub.  No murmur heard. Pulses 2+ and symmetric bilateral lower extremities  Pulmonary/Chest: Effort normal and breath sounds normal. No respiratory distress. She has no wheezes. She has no rales.  Abdominal: She exhibits no distension.  Bruising noted to the right lower abdomen and inguinal area.  No palpable hematoma.  Musculoskeletal: She exhibits edema (1+ pitting b/l LE).  Neurological: She is alert and oriented to person, place, and time. She exhibits normal muscle tone.  Face symmetric.  Strength 5 out of 5 bilateral upper and lower joints.  Normal sensation light touch.  Skin: Skin is warm. Capillary refill takes less than 2 seconds.  Psychiatric: She has a normal mood and affect.  Nursing note and vitals reviewed.    ED Treatments / Results  Labs (all labs ordered are listed, but only abnormal  results are displayed) Labs Reviewed  CBC WITH DIFFERENTIAL/PLATELET - Abnormal; Notable for the following components:      Result Value   WBC 12.0 (*)    RBC 3.39 (*)    Hemoglobin 10.4 (*)    HCT 33.1 (*)    RDW 15.7 (*)    Neutro Abs 9.1 (*)    All other components within normal limits  COMPREHENSIVE METABOLIC PANEL - Abnormal; Notable for the following components:   Potassium 2.5 (*)    Chloride 84 (*)    CO2 44 (*)    Glucose, Bld 149 (*)    Creatinine, Ser 1.23 (*)    Total Protein 6.4 (*)    Albumin 3.0 (*)    GFR calc non Af Amer 46 (*)    GFR calc Af Amer 53 (*)    All other components within normal limits  MAGNESIUM - Abnormal; Notable for the following components:   Magnesium 1.4 (*)    All other components within normal limits  BRAIN NATRIURETIC PEPTIDE - Abnormal; Notable for the following components:   B Natriuretic Peptide 154.5 (*)    All other components within normal limits  CBG MONITORING, ED - Abnormal; Notable for the following components:   Glucose-Capillary 144 (*)    All other components within normal limits  I-STAT CG4 LACTIC ACID, ED - Abnormal; Notable for the following components:   Lactic Acid, Venous 2.13 (*)    All other components within normal limits  CBG MONITORING, ED - Abnormal; Notable for the following components:   Glucose-Capillary 151 (*)    All other components within normal limits  CBG MONITORING, ED - Abnormal; Notable for the following components:   Glucose-Capillary 143 (*)    All other components within normal limits  TROPONIN I  URINALYSIS, ROUTINE W REFLEX MICROSCOPIC  I-STAT CG4 LACTIC ACID, ED  CBG MONITORING, ED    EKG EKG Interpretation  Date/Time:  Monday October 23 2017 08:23:19 EDT Ventricular Rate:  81 PR Interval:  148 QRS Duration: 90 QT Interval:  442 QTC Calculation: 513 R Axis:   40 Text  Interpretation:  Sinus rhythm with Premature atrial complexes with Abberant conduction Cannot rule out Anterior  infarct , age undetermined Prolonged QT Abnormal ECG No significant change since last tracing Confirmed by Duffy Bruce [phone]) on 10/23/2017 8:40:15 AM   Radiology Dg Chest 2 View  Result Date: 10/23/2017 CLINICAL DATA:  Dizziness. EXAM: CHEST - 2 VIEW COMPARISON:  None. FINDINGS: The heart, hila, and mediastinum are normal. No pulmonary nodules, masses, or focal infiltrates. IMPRESSION: No active cardiopulmonary disease. Electronically Signed   By: Dorise Bullion III M.D   On: 10/23/2017 09:12    Procedures .Critical Care Performed by: Duffy Bruce, MD Authorized by: Duffy Bruce, MD   Critical care provider statement:    Critical care time (minutes):  35   Critical care time was exclusive of:  Separately billable procedures and treating other patients and teaching time   Critical care was necessary to treat or prevent imminent or life-threatening deterioration of the following conditions:  Circulatory failure and respiratory failure   Critical care was time spent personally by me on the following activities:  Development of treatment plan with patient or surrogate, discussions with consultants, evaluation of patient's response to treatment, examination of patient, obtaining history from patient or surrogate, ordering and performing treatments and interventions, ordering and review of laboratory studies, ordering and review of radiographic studies, pulse oximetry, re-evaluation of patient's condition and review of old charts   I assumed direction of critical care for this patient from another provider in my specialty: no     (including critical care time)  Medications Ordered in ED Medications  potassium chloride 10 mEq in 100 mL IVPB (10 mEq Intravenous New Bag/Given 10/23/17 0949)  furosemide (LASIX) injection 80 mg (has no administration in time range)  magnesium sulfate IVPB 2 g 50 mL (0 g Intravenous Stopped 10/23/17 1110)  potassium chloride SA (K-DUR,KLOR-CON) CR tablet 40  mEq (40 mEq Oral Given 10/23/17 0947)  oxyCODONE-acetaminophen (PERCOCET/ROXICET) 5-325 MG per tablet 1 tablet (1 tablet Oral Given 10/23/17 1031)     Initial Impression / Assessment and Plan / ED Course  I have reviewed the triage vital signs and the nursing notes.  Pertinent labs & imaging results that were available during my care of the patient were reviewed by me and considered in my medical decision making (see chart for details).     62 year old female here with generalized weakness and shortness of breath.  Patient thought it was due to hypoglycemia but is normoglycemic here.  On exam, she has bilateral rales and increased work of breathing, as well as pitting edema.  Lab work shows hypokalemia, hypomagnesemia, and BNP that is above her baseline.  Clinically, she appears dyspneic with orthopnea and increased work of breathing.  Concern for ongoing CHF component, complicated by electrolyte derangements due to aggressive diuresis.  I do think she would benefit from ongoing diuresis, but will need monitoring of electrolytes.  Will plan to admit to medicine.  Final Clinical Impressions(s) / ED Diagnoses   Final diagnoses:  Acute on chronic congestive heart failure, unspecified heart failure type ([place])  Hypokalemia  Hypomagnesemia    ED Discharge Orders    None       Duffy Bruce, MD 10/23/17 1118    Duffy Bruce, MD 10/31/17 [phone]

## 2017-10-23 NOTE — Progress Notes (Addendum)
Called ED to get report on patient. Secretary unable to locate will have them call back.

## 2017-10-23 NOTE — Progress Notes (Signed)
Patient arrived to unit 5 [place]. Patient telemetry box placed, patient bathed, assessed and resting in room. No evidence of skin breakdown/pressure ulcers noted. Patient found to have bruising and abrasions.

## 2017-10-23 NOTE — Progress Notes (Signed)
RLE arterial duplex limited prelim: No evidence of reoccurrence of pseudoaneurysm. Large hematoma noted in RLQ and right groin.  Landry Mellow, RDMS, RVT

## 2017-10-23 NOTE — ED Notes (Signed)
Vascular at bedside

## 2017-10-23 NOTE — Consult Note (Signed)
[facility]  Patient name: Crystal Yates MRN: [id number] DOB: 1956/02/18 Sex: female    HPI: Crystal Yates is a 62 y.o. female seen in consultation regarding right groin hematoma.  She was seen by Dr. Trula Slade on 10/17/2017 for large hematoma and also false aneurysm.  This was following cardiac catheterization via the right groin.  She underwent successful compression and occlusion of the false aneurysm on 10/18/2017.  She was discharged and was readmitted to the emergency department today with worsening shortness of breath and dizziness.  She does report pain in her right groin but this is stable.  She has no evidence of distal ischemia.  Past Medical History:  Diagnosis Date  . Acute on chronic systolic heart failure exacerbation(HCC) 04/08/2016  . CAD in native artery    a. Prior LAD stenting based on cath. b. RCA stenting 03/2016 x2.  . Chronic combined systolic and diastolic CHF (congestive heart failure) ([place])   . CKD (chronic kidney disease), stage II   . COPD (chronic obstructive pulmonary disease) ([place])   . Diabetes mellitus without complication ([place])   . Hashimoto's thyroiditis   . Hyperlipidemia   . Hypertension   . Myocardial infarction ([place])   . On home oxygen therapy    "2L; 24/7" (10/23/2017)  . Secondary adrenal insufficiency ([place])   . Thyroid disease   . Tobacco abuse     Family History  Problem Relation Age of Onset  . Stroke Mother   . Diabetes Father   . Diabetes Sister   . Diabetes Sister   . Diabetes Son     SOCIAL HISTORY: Social History   Tobacco Use  . Smoking status: Current Every Day Smoker    Packs/day: 0.75    Years: 44.00    Pack years: 33.00    Types: Cigarettes  . Smokeless tobacco: Never Used  Substance Use Topics  . Alcohol use: Yes    Alcohol/week: 4.2 oz    Types: 7 Shots of liquor per week    Allergies  Allergen Reactions  .  Hydroxychloroquine Shortness Of Breath, Nausea Only and Other (See Comments)    Dizziness (also)  . Donepezil Other (See Comments)    Dizziness, depression, and makes the patient feel "funny"  . Prednisone Other (See Comments)    Causes depression and suicidal thoughts  . Bupropion Other (See Comments)    Suicidal thoughts  . Metrizamide Other (See Comments)    (a non-ionic radiopaque contrast agent) "Blows the vein" and contrast gathers at the injected site's limb  . Varenicline Other (See Comments)    Suicidal thoughts  . Tape Rash and Other (See Comments)    Paper tape is preferred, PLEASE    Current Facility-Administered Medications  Medication Dose Route Frequency Provider Last Rate Last Dose  . acetaminophen (TYLENOL) tablet 650 mg  650 mg Oral Q6H PRN Alphonzo Grieve, MD       Or  . acetaminophen (TYLENOL) suppository 650 mg  650 mg Rectal Q6H PRN Alphonzo Grieve, MD      . aspirin chewable tablet 81 mg  81 mg Oral Daily Alphonzo Grieve, MD   81 mg at 10/23/17 1628  . atorvastatin (LIPITOR) tablet 80 mg  80 mg Oral q1800 Alphonzo Grieve, MD   80 mg at 10/23/17 1712  . [START ON 10/24/2017] umeclidinium-vilanterol (ANORO ELLIPTA) 62.5-25 MCG/INH 1 puff  1 puff Inhalation Daily Bartholomew Crews, MD       And  . [  START ON 10/24/2017] budesonide (PULMICORT) nebulizer solution 0.25 mg  0.25 mg Nebulization Daily Bartholomew Crews, MD      . clopidogrel (PLAVIX) tablet 75 mg  75 mg Oral Daily Alphonzo Grieve, MD   75 mg at 10/23/17 1627  . DULoxetine (CYMBALTA) DR capsule 60 mg  60 mg Oral Daily Alphonzo Grieve, MD   60 mg at 10/23/17 1627  . fludrocortisone (FLORINEF) tablet 0.1 mg  0.1 mg Oral BID Alphonzo Grieve, MD   0.1 mg at 10/23/17 1627  . HYDROcodone-acetaminophen (NORCO) 10-325 MG per tablet 1 tablet  1 tablet Oral Q6H PRN Alphonzo Grieve, MD   1 tablet at 10/23/17 1712  . hydrocortisone (CORTEF) tablet 10 mg  10 mg Oral QHS Alphonzo Grieve, MD      . hydrocortisone  (CORTEF) tablet 30 mg  30 mg Oral Daily Alphonzo Grieve, MD   30 mg at 10/23/17 1628  . insulin aspart (novoLOG) injection 0-9 Units  0-9 Units Subcutaneous Q4H Alphonzo Grieve, MD      . Derrill Memo ON 10/24/2017] levothyroxine (SYNTHROID, LEVOTHROID) tablet 150 mcg  150 mcg Oral QAC breakfast Alphonzo Grieve, MD      . oxybutynin (DITROPAN XL) 24 hr tablet 15 mg  15 mg Oral QHS [place], Gorica, MD      . polyethylene glycol (MIRALAX / GLYCOLAX) packet 17 g  17 g Oral Daily PRN Alphonzo Grieve, MD      . potassium chloride 10 mEq in 100 mL IVPB  10 mEq Intravenous Q1 Hr x 6 Velna Ochs, MD 100 mL/hr at 10/23/17 2004 10 mEq at 10/23/17 2004  . potassium chloride SA (K-DUR,KLOR-CON) CR tablet 40 mEq  40 mEq Oral Arnold Long, MD   40 mEq at 10/23/17 1959  . pramipexole (MIRAPEX) tablet 1 mg  1 mg Oral BID Alphonzo Grieve, MD   1 mg at 10/23/17 1627    REVIEW OF SYSTEMS:  [X]  denotes positive finding, [ ]  denotes negative finding Cardiac  Comments:  Chest pain or chest pressure: x   Shortness of breath upon exertion: x   Short of breath when lying flat: x   Irregular heart rhythm:        Vascular    Pain in calf, thigh, or hip brought on by ambulation:    Pain in feet at night that wakes you up from your sleep:     Blood clot in your veins:    Leg swelling:  x         PHYSICAL EXAM: Vitals:   10/23/17 1345 10/23/17 1430 10/23/17 1445 10/23/17 1550  BP: 118/63 121/64 (!) 117/96 (!) 125/55  Pulse: 76 68 70 69  Resp: 12 18 17 18   Temp:    97.8 F (36.6 C)  TempSrc:    Oral  SpO2: 93% 92% 93% 90%    GENERAL: The patient is a well-nourished female, in no acute distress. The vital signs are documented above. CARDIOVASCULAR: Right groin with bruising.  Soft.  Not pulsatile in nature.  Abdomen soft and benign. PULMONARY: There is good air exchange  MUSCULOSKELETAL: There are no major deformities or cyanosis. NEUROLOGIC: No focal weakness or paresthesias are detected. SKIN:  There are no ulcers or rashes noted. PSYCHIATRIC: The patient has a normal affect.  DATA:  H&H stable.  Was 9 and 29 at the time of her discharge on 10/21/2017.  Today is 10 and 33.  Duplex shows large right groin hematoma with no evidence of false aneurysm  MEDICAL ISSUES: Discussed with the patient.  Difficult with morbid obesity and large groin hematoma following cath.  No evidence that this is increased in size.  Recommend continued nonoperative treatment.  No indication for exploration and extremely high risk for complications given morbid obesity with BMI of 42    Rosetta Posner, MD FACS [facility] Tel [phone] Pager [phone]

## 2017-10-23 NOTE — H&P (Signed)
Date: 10/23/2017               Patient Name:  Crystal Yates MRN: [id number]  DOB: Jan 27, 1956 Age / Sex: 62 y.o., female   PCP: Dortha Kern, PA         Medical Service: Internal Medicine Teaching Service         Attending Physician: Dr. Bartholomew Crews, MD    First Contact: Dr. Jari Favre Pager: [phone]            After Hours (After 5p/  First Contact Pager: [phone]  weekends / holidays): Second Contact Pager: [phone]   Chief Complaint: weakness  History of Present Illness:  Crystal Yates is a 62yo female with PMH of T2DM, HTN, ICM with h/o combined CHF (last echo 09/2017 showing EF 60-65%, G1DD), h/o CAD s/p stenting of dRCA and balloon angioplasty of re-stenosis 09/2017 presenting to the ED with 1 day history of progressive wheezing, dyspnea, orthopnea as well as dizziness, nausea and fatigue.  Patient was discharged 7/26; during that admission she had management of pseudoaneurysm of RFA after a LHC (this was reduced successfully) as well as CHF exacerbation - she was discharged on torsemide 40mg  BID, and her entresto and coreg were held on discharged due to softer BPs.   Patient states she felt began noticing some mild nausea yesterday morning along with dizziness worse with standing, however throughout the day she developed dyspnea, orthopnea, wheezing. She woke up this morning with these symptoms worsened - her husband checked her CGM which showed reading of 65 (treated with crackers and glucose tabs) then reading of 45 five minutes later at which time they called EMS. On their arrival just a couple of minutes later her fingerstick CBG was low 100s. She had some improvement in her symptoms but not significantly so she was brought in to the ED for evaluation.   Since discharge, patient states she has been taking all of her medicines including her steroids (though to pharmacy tech she noted not to be taking florinef); she noted good urine output with torsemide and  states she limited her fluid intake especially since she didn't have much of an appetite yesterday.   With her dyspnea she denies chest pain; she has a stable cough that is non productive. She denies increased abdominal girth; she has been wearing home compression stockings which she states did help in decreasing her LE swelling. She denies vomiting, diarrhea, constipation or abdominal pain with her nausea. She continues to have RLQ pain and tenderness at side of bruising and hematoma at RFA cath site.   She denies fevers, chills, hematuria, hematochezia, melena.  Meds:  Current Meds  Medication Sig  . acetaminophen (TYLENOL) 325 MG tablet Take 2 tablets (650 mg total) by mouth every 4 (four) hours as needed for headache or mild pain.  Marland Kitchen aspirin 81 MG chewable tablet Chew 1 tablet (81 mg total) by mouth daily.  Marland Kitchen atorvastatin (LIPITOR) 80 MG tablet Take 1 tablet (80 mg total) by mouth daily at 6 PM.  . CALCIUM PO Take 1 tablet by mouth daily.  . Cholecalciferol (VITAMIN D3) 2000 units capsule Take 2,000 Units by mouth daily.   . clopidogrel (PLAVIX) 75 MG tablet Take 75 mg by mouth daily.  . DULoxetine (CYMBALTA) 60 MG capsule Take 60 mg by mouth once a day  . EMGALITY 120 MG/ML SOAJ Inject 120 mg into the skin every 30 (thirty) days.   . hydrocortisone (CORTEF) 5 MG  tablet Take 5-15 mg by mouth See admin instructions. Take 15 mg by mouth in the morning and 5 mg in the afternoon  . Insulin Aspart, w/Niacinamide, (FIASP FLEXTOUCH) 100 UNIT/ML SOPN Inject 2-14 Units into the skin See admin instructions. Use three times day as needed before meals per sliding scale  . insulin degludec (TRESIBA FLEXTOUCH) 100 UNIT/ML SOPN FlexTouch Pen Inject 40 Units into the skin at bedtime.   . Iron-FA-B Cmp-C-Biot-Probiotic (FUSION PLUS) CAPS Take 1 capsule by mouth daily.  Marland Kitchen levothyroxine (SYNTHROID, LEVOTHROID) 150 MCG tablet Take 150 mcg by mouth daily before breakfast.  . Magnesium 250 MG TABS Take 250 mg by  mouth daily.  . nitroGLYCERIN (NITROSTAT) 0.4 MG SL tablet Place 0.4 mg under the tongue every 5 (five) minutes as needed for chest pain.   Marland Kitchen oxybutynin (DITROPAN XL) 15 MG 24 hr tablet Take 15 mg by mouth at bedtime.  . Oxycodone HCl 10 MG TABS Take 0.5-1 tablets (5-10 mg total) by mouth every 6 (six) hours as needed. (Patient taking differently: Take 5-10 mg by mouth every 6 (six) hours as needed (pain). )  . potassium chloride SA (K-DUR,KLOR-CON) 20 MEQ tablet Take 1 tablet (20 mEq total) by mouth daily.  . pramipexole (MIRAPEX) 1 MG tablet Take 1 mg by mouth 2 (two) times daily.  . Semaglutide (OZEMPIC) 0.25 or 0.5 MG/DOSE SOPN Inject 0.5 mg into the skin every 30 (thirty) days.   Marland Kitchen torsemide (DEMADEX) 20 MG tablet Take 2 tablets (40 mg total) by mouth 2 (two) times daily.  . TRELEGY ELLIPTA 100-62.5-25 MCG/INH AEPB Inhale 1 puff into the lungs daily.  . TRULICITY 1.5 QQ/5.9DG SOPN Inject 1.5 mg into the skin every Sunday.    Allergies: Allergies as of 10/23/2017 - Review Complete 10/23/2017  Allergen Reaction Noted  . Hydroxychloroquine Shortness Of Breath, Nausea Only, and Other (See Comments) 11/21/2013  . Donepezil Other (See Comments) 10/16/2013  . Prednisone Other (See Comments) 09/02/2013  . Bupropion Other (See Comments) 07/15/2016  . Metrizamide Other (See Comments) 10/05/2016  . Varenicline Other (See Comments) 07/15/2016  . Tape Rash and Other (See Comments) 10/12/2016   Past Medical History:  Diagnosis Date  . Acute on chronic systolic heart failure exacerbation(HCC) 04/08/2016  . CAD in native artery    a. Prior LAD stenting based on cath. b. RCA stenting 03/2016 x2.  . Chronic combined systolic and diastolic CHF (congestive heart failure) ([place])   . CKD (chronic kidney disease), stage II   . COPD (chronic obstructive pulmonary disease) ([place])   . Diabetes mellitus without complication ([place])   . Hashimoto's thyroiditis   . Hyperlipidemia   . Hypertension   . Myocardial  infarction ([place])   . Secondary adrenal insufficiency ([place])   . Thyroid disease   . Tobacco abuse     Family History: Mother died of a stroke; father is deceased, he had diabetes  Social History: Married, current smoker of 3-4 pack per day for 40 years, intake of 1-2oz of liquor daily; denies illicit drug use.  Review of Systems: A complete ROS was negative except as per HPI.   Physical Exam: Blood pressure (!) 118/51, pulse 75, temperature 98 F (36.7 C), temperature source Oral, resp. rate 20, SpO2 94 %. GENERAL- alert, co-operative, appears as stated age, not in any distress. HEENT- PERRL, EOMI, oral mucosa appears moist CARDIAC- RRR, no murmurs, rubs or gallops. RESP- Moving equal volumes of air, bibasilar crackles, no wheezing or rhonchi appreciated ABDOMEN- Soft, obese;  RLQ with indurated area and surrounding ecchymosis that has settled to lateral flank, close to midline of pubis and some into upper thigh - this area is tender to palpation.  NEURO- No obvious Cr N abnormality. EXTREMITIES- pulse 2+ radial, 1+ DP; warm, 1+ soft pitting edema bilaterally to knees SKIN- Warm, dry, no rash or lesion. PSYCH- Flat affect   EKG: personally reviewed my interpretation is no ST elevation or depression, no TWI; PVC  CXR: personally reviewed my interpretation is bibasilar haziness that could represent infiltrates but w/o effusion, consolidation, pneumothorax  CBC notable for WBC 12.0, Hgb 10.4 (previous ~9), platelets 265 CMet notable for K 2.5, bicarb 44, BUN 17, Cr 1.23 Mag was 1.4 BNP  Was 154.5 (previous 147) Trop was negative Lactic acid 2.13>1.0 UA negative  RLE arterial ultrasound shows hematoma of 30cm in right lower quadrant and groin, but no evidence of pseudoaneurysm.  Assessment & Plan by Problem: Active Problems:   Hypokalemia  Combined CHF exacerbation: Patient with symptoms of volume pulm edema with dyspnea, orthopnea, and wheezing which she usually gets when  volume overloaded; exam revealed bibasilar rales and CXR suggestive of bil infiltrates. She has not had fevers, no wheezing was appreciated and no productive cough to suggest pneumonia or COPD exacerbation. She doesn't have CP and initial troponin was negative in the ED making ACS less likely based on timing of her symptoms. It is possible that the torsemide dose was not enough though she did note consistent good urine output since discharge. If she had accidentally taken her coreg at home in setting of holding it for several days, this may have triggered an exacerbation due to decreasing CO. She is s/p 1 dose of IV lasix 80mg  in the ED and reports good urine output since then. She is on [facility] and breathing comfortably. --hold further diuresis for today based on soft BPs --continue holding entresto, coreg, and imdur --daily weights and strict in/outs --fluid restriction  Hypokalemia Hypomagnesemia: Patient with K of 2.5 and Mag of 1.4 on presentation to the ED which could certainly explain some of her symptoms and fatigue. EKG showed prolonged QT. She has received 3x 64mEq KCl runs and 46mEq PO Kdur in the ED along with 2g IV magnesium. --f/u Bmet at 5pm and further replete K as necessary --f/u AM bmet and mag  Nausea, ?hypoglycemia, soft BPs, dizziness Secondary adrenal insufficiency: During prior hospitalization, patient had her hydrocortisone dose doubled while acutely ill and at discharge was transitioned back to her florinef 0.1mg  BID and hydrocortisone 15mg  qAM and 5mg  qPM. Patient noted to pharmacy tech that she was no longer taking florinef which might contribute to her softer BPs on arrival to ED. On exam she appears volume overloaded; her Hgb is stable so acute anemia is not the likely cause. --restart florinef 0.1mg  BID --double home dose of hydrocortisone for now --q4hr CBG SSI-S for now  RLQ hematoma: Patient with resolution of pseudoaneurysm, however has persistent and possibly  spreading ecchymosis; U/S today revealed ~30cm hematoma. She still has tenderness and pain at this site. Her Hgb is stable at 10 on admission. --consulted vascular surgery who will see patient tomorrow - appreciate their recommendations  T2DM: Patient on Tresiba 40u qhs, aspart w/niacinamide sliding scale with meals, semaglutide at home. Possible hypoglycemic episode today based on continuous glucose monitor reading which husband reported; though the readings continued to trend down with appropriate treatment of hypoglycemia this is likely due to 15-54min delay in equilibration of subq glucose vs  capillary or vein glucose. CBG on arrival was 140s. --hold home meds --q4hr cbg with SSI-S for now in setting of possible exacerbation of adrenal insufficiency  CAD HTN: --continue asa and plavix, atorvastatin --hold entresto, imdur, coreg  Hypothyroidism: Patient continued on home levothyroxine 136mcg daily  COPD on chronic 2L [place]: Does not appear to be in acute exacerbation. --continue triple therapy based on formulary (home Trelegy)  Dispo: Admit patient to Inpatient with expected length of stay greater than 2 midnights.  Signed: Alphonzo Grieve, MD 10/23/2017, 1:35 PM  IMTS - PGY3 Pager [phone]

## 2017-10-23 NOTE — ED Triage Notes (Signed)
Pt in c/o dizziness throughout the night, states her sugar readings at home kept dropping into the 40s, symptoms improved as glucose improved, but this morning she still feels very weak. Pt was recently admitted and had a femoral artery rupture after a cardiac catheterization, pt reports the area is still swollen but she does not think it is more swollen, but is having more pain

## 2017-10-23 NOTE — Progress Notes (Signed)
CRITICAL VALUE STICKER  CRITICAL VALUE: K 2.6  MD NOTIFIED: Night coverage Dr. Zenovia Jarred team  TIME OF NOTIFICATION: 1904  RESPONSE: Will add orders for K+

## 2017-10-24 LAB — COMPREHENSIVE METABOLIC PANEL
ALK PHOS: 70 U/L (ref 38–126)
ALT: 20 U/L (ref 0–44)
ANION GAP: 10 (ref 5–15)
AST: 24 U/L (ref 15–41)
Albumin: 2.7 g/dL — ABNORMAL LOW (ref 3.5–5.0)
BILIRUBIN TOTAL: 0.8 mg/dL (ref 0.3–1.2)
BUN: 16 mg/dL (ref 8–23)
CALCIUM: 9.3 mg/dL (ref 8.9–10.3)
CO2: 40 mmol/L — AB (ref 22–32)
CREATININE: 1.15 mg/dL — AB (ref 0.44–1.00)
Chloride: 90 mmol/L — ABNORMAL LOW (ref 98–111)
GFR calc Af Amer: 58 mL/min — ABNORMAL LOW (ref >60)
GFR calc non Af Amer: 50 mL/min — ABNORMAL LOW (ref >60)
Glucose, Bld: 152 mg/dL — ABNORMAL HIGH (ref 70–99)
Potassium: 3.9 mmol/L (ref 3.5–5.1)
SODIUM: 140 mmol/L (ref 135–145)
TOTAL PROTEIN: 6 g/dL — AB (ref 6.5–8.1)

## 2017-10-24 LAB — BASIC METABOLIC PANEL
Anion gap: 11 (ref 5–15)
Anion gap: 12 (ref 5–15)
BUN: 16 mg/dL (ref 8–23)
BUN: 14 mg/dL (ref 8–23)
CALCIUM: 9.3 mg/dL (ref 8.9–10.3)
CO2: 42 mmol/L — AB (ref 22–32)
CO2: 39 mmol/L — AB (ref 22–32)
Calcium: 9.6 mg/dL (ref 8.9–10.3)
Chloride: 86 mmol/L — ABNORMAL LOW (ref 98–111)
Chloride: 89 mmol/L — ABNORMAL LOW (ref 98–111)
Creatinine, Ser: 1.11 mg/dL — ABNORMAL HIGH (ref 0.44–1.00)
Creatinine, Ser: 1.16 mg/dL — ABNORMAL HIGH (ref 0.44–1.00)
GFR calc Af Amer: >60 mL/min (ref >60)
GFR calc Af Amer: 57 mL/min — ABNORMAL LOW (ref >60)
GFR, EST NON AFRICAN AMERICAN: 52 mL/min — AB (ref >60)
GFR, EST NON AFRICAN AMERICAN: 49 mL/min — AB (ref >60)
GLUCOSE: 146 mg/dL — AB (ref 70–99)
Glucose, Bld: 137 mg/dL — ABNORMAL HIGH (ref 70–99)
Potassium: 4 mmol/L (ref 3.5–5.1)
Potassium: 4.2 mmol/L (ref 3.5–5.1)
Sodium: 139 mmol/L (ref 135–145)
Sodium: 140 mmol/L (ref 135–145)

## 2017-10-24 LAB — GLUCOSE, CAPILLARY
GLUCOSE-CAPILLARY: 175 mg/dL — AB (ref 70–99)
Glucose-Capillary: 130 mg/dL — ABNORMAL HIGH (ref 70–99)
Glucose-Capillary: 145 mg/dL — ABNORMAL HIGH (ref 70–99)
Glucose-Capillary: 149 mg/dL — ABNORMAL HIGH (ref 70–99)
Glucose-Capillary: 188 mg/dL — ABNORMAL HIGH (ref 70–99)
Glucose-Capillary: 266 mg/dL — ABNORMAL HIGH (ref 70–99)

## 2017-10-24 LAB — CBC
HCT: 30.3 % — ABNORMAL LOW (ref 36.0–46.0)
HEMOGLOBIN: 9.5 g/dL — AB (ref 12.0–15.0)
MCH: 30.4 pg (ref 26.0–34.0)
MCHC: 31.4 g/dL (ref 30.0–36.0)
MCV: 97.1 fL (ref 78.0–100.0)
Platelets: 254 10*3/uL (ref 150–400)
RBC: 3.12 MIL/uL — AB (ref 3.87–5.11)
RDW: 15.3 % (ref 11.5–15.5)
WBC: 11.1 10*3/uL — ABNORMAL HIGH (ref 4.0–10.5)

## 2017-10-24 LAB — MAGNESIUM: MAGNESIUM: 2 mg/dL (ref 1.7–2.4)

## 2017-10-24 MED ORDER — RAMELTEON 8 MG PO TABS
8.0000 mg | ORAL_TABLET | Freq: Every day | ORAL | Status: DC
Start: 2017-10-24 — End: 2017-10-26
  Administered 2017-10-24 – 2017-10-25 (×2): 8 mg via ORAL
  Filled 2017-10-24 (×2): qty 1

## 2017-10-24 MED ORDER — INSULIN ASPART 100 UNIT/ML ~~LOC~~ SOLN
0.0000 [IU] | Freq: Three times a day (TID) | SUBCUTANEOUS | Status: DC
  Administered 2017-10-24: 2 [IU] via SUBCUTANEOUS
  Administered 2017-10-24: 5 [IU] via SUBCUTANEOUS
  Administered 2017-10-25: 2 [IU] via SUBCUTANEOUS
  Administered 2017-10-25: 7 [IU] via SUBCUTANEOUS
  Administered 2017-10-25: 1 [IU] via SUBCUTANEOUS

## 2017-10-24 MED ORDER — FUROSEMIDE 10 MG/ML IJ SOLN
120.0000 mg | Freq: Once | INTRAVENOUS | Status: AC
  Administered 2017-10-24: 120 mg via INTRAVENOUS
  Filled 2017-10-24: qty 10

## 2017-10-24 MED ORDER — INSULIN GLARGINE 100 UNIT/ML ~~LOC~~ SOLN
10.0000 [IU] | Freq: Every day | SUBCUTANEOUS | Status: DC
  Administered 2017-10-24: 10 [IU] via SUBCUTANEOUS
  Filled 2017-10-24 (×2): qty 0.1

## 2017-10-24 MED ORDER — DEXTROSE 5 % IV SOLN
120.0000 mg | Freq: Once | INTRAVENOUS | Status: AC
  Administered 2017-10-24: 120 mg via INTRAVENOUS
  Filled 2017-10-24: qty 10

## 2017-10-24 NOTE — Progress Notes (Addendum)
   Subjective:  Patient states she is feeling somewhat better today. She reports good urine output yesterday. He breathing is about the same, dizziness is somewhat improved, and nausea is resolved. She still has pain in the RLQ and groin at her hematoma site.  Objective:  Vital signs in last 24 hours: Vitals:   10/23/17 1550 10/23/17 2124 10/24/17 0457 10/24/17 0500  BP: (!) 125/55 (!) 116/58 122/67   Pulse: 69 75 68   Resp: 18 20 20    Temp: 97.8 F (36.6 C) 98.1 F (36.7 C) (!) 97 F (36.1 C)   TempSrc: Oral Oral    SpO2: 90% 96% 98%   Weight:    221 lb 12.5 oz (100.6 kg)   Constitutional: NAD, sitting up in bed CV: RRR, no M/R/G Resp: R basilar rales, otherwise w/o rhonchi or wheezing, no increased work of breathing Abd: soft, ND; tenderness over site of hematoma; stable ecchymosis Ext: minimal LE edema  Assessment/Plan:  Active Problems:   Hypokalemia  Combined CHF exacerbation (EF 60-65%, G1DD) Net output was about even yesterday after IV lasix dose, however uncertain if ins/outs are accurate. Weight today is 221lbs which is lower than at discharge; previous dry weight ~213lbs. On exam her LE edema is significantly improved, though she still has right basilar rales. SBPs stable in 120s today. --one time dose of IV lasix 120mg  --strict in/outs, daily weights; fluid restriction --continue holding entresto, coreg, imdur  Hypokalemia Hypomagnesemia: Resolved - K 4.2, Mag 2.0 this morning. --f/u am Bmet and Mag, replete as indicated  Secondary adrenal insufficiency: BPs stable today --continue florinef 0.1mg  BID --continue hydrocortisone 30mg  qAM, 10mg  qPM  RLQ hematoma: Patient with resolution of pseudoaneurysm, however has persistent and possibly spreading ecchymosis; U/S today revealed ~30cm hematoma. She still has tenderness and pain at this site. Her Hgb is stable at 9.5 today. Vascular surgery evaluated and do not recommend evacuation at this time due to increased  risk of infection compared to minimal benefit. Appreciate their input.  T2DM: Patient on Tresiba 40u qhs, aspart w/niacinamide sliding scale with meals, semaglutide at home. CBGs 130-170s --will start Lantus 10 units qhs --change to SSI wc  CAD HTN: --continue asa and plavix, atorvastatin --hold entresto, imdur, coreg  Hypothyroidism: Patient continued on home levothyroxine 167mcg daily  COPD on chronic 2L [place]: Does not appear to be in acute exacerbation. --continue triple therapy based on formulary (home Trelegy)  Dispo: Anticipated discharge in approximately 2-3 day(s).   Alphonzo Grieve, MD 10/24/2017, 10:37 AM IMTS - PGY3 Pager [phone]

## 2017-10-24 NOTE — Progress Notes (Signed)
Patient given iv potassium, during the third run of potassium patient complain of pain  and notified the MD then stop the potassium as per MD verbal order. Will continue monitor the patient.

## 2017-10-24 NOTE — Progress Notes (Signed)
[facility]  Subjective  - Doing better today, no SOB, no CP, no change reported in the right groin.   Objective 122/67 68 (!) 97 F (36.1 C) 20 98%  Intake/Output Summary (Last 24 hours) at 10/24/2017 0925 Last data filed at 10/24/2017 0512 Gross per 24 hour  Intake 765.79 ml  Output 850 ml  Net -84.21 ml    Feet well perfused with intact sensation and active range of motion. Right groin compressible, ecchymosis no increase in distention of hematoma.  Painful to palpation.  Assessment/Planning: Stable right groin hemartoma without expansion.   Recommend continued nonoperative treatment.   Mobility encouraged.   [name] 10/24/2017 9:25 AM --  Laboratory Lab Results: Recent Labs    10/23/17 0839 10/24/17 0407  WBC 12.0* 11.1*  HGB 10.4* 9.5*  HCT 33.1* 30.3*  PLT 265 254   BMET Recent Labs    10/24/17 0407 10/24/17 0704  NA 140 140  K 3.9 4.2  CL 90* 89*  CO2 40* 39*  GLUCOSE 152* 137*  BUN 16 14  CREATININE 1.15* 1.16*  CALCIUM 9.3 9.6    COAG Lab Results  Component Value Date   INR 1.02 10/13/2016   No results found for: PTT

## 2017-10-24 NOTE — Progress Notes (Signed)
Date: 10/24/2017  Patient name: Crystal Yates  Medical record number: [id number]  Date of birth: 1956/02/07   I have seen and evaluated Crystal Yates and discussed their care with the Residency Team. Crystal Yates is a 62 yo woman admitted to our service July 23 through July 26 for a pseudoaneurysm of the right femoral artery after a left heart cath and a CHF exacerbation.  Her presumed dry weight as of June 2019 was 213 pounds and she was 229 on the day of discharge.  On the day prior to this admission, she began to have mild nausea, dizziness with standing, dyspnea, orthopnea, and wheezing which on the morning of admission, were progressing.  She endorses taking her home diuretic and having urine output that she does not check her weight at home.  On the morning of admission, she had symptomatic hypoglycemia which led her to calling EMS.  In the ED, she was found to have hypokalemia and hypomagnesemia which have both been repleted.  She was given a single dose of 120 IV Lasix in the ED and had 850 cc of urine recorded but with a similar recorded in part.  However, today her weight was recorded as 221.  She feels a bit better today but not yet at baseline.  Echo done on July 25 showed a normal EF of 60 to 07%, grade 1 diastolic dysfunction, and normal wall motion.  Additionally, the right heart appearance and function were also normal.  Past medical history includes type 2 diabetes, hypertension, coronary artery disease status post stenting of the RCA and balloon angioplasty of a restenosis in July 2019.  Vitals:   10/23/17 2124 10/24/17 0457  BP: (!) 116/58 122/67  Pulse: 75 68  Resp: 20 20  Temp: 98.1 F (36.7 C) (!) 97 F (36.1 C)  SpO2: 96% 98%  Gen NAD, no accessory muscle usage, speaking in full sentences HRRR no MRG L + rales in bases ABD + BS Ext less edema than last admission Skin : dry and warm, thin with ecchymosis  Cr 1.16 CO 2 39, down from high of  45 HgB 9.5 (at recent baseline)  I personally viewed the CXR images and confirmed my reading with the official read. 2 view AP and lateral, L angle obscured but no overt pul edema  I personally viewed the EKG and confirmed my reading with the official read. Sinus, nl axis, Inc QTc, old inf Q waves  Assessment and Plan: I have seen and evaluated the patient as outlined above. I agree with the formulated Assessment and Plan as detailed in the residents' note, with the following changes: Crystal Yates is a 62 yo woman admitted to our service in July for a pseudoaneurysm of the right femoral artery after a left heart cath and a CHF exacerbation.  Her presumed dry weight in June of this year was 213 pounds and she was discharged with a weight of 229.  She does not check her weights at home but does state she has been taking her diuretics as an outpatient.  She was admitted with hype hypokalemia and hypo-magnesium anemia which have both been repleted.  She is also volume overloaded although her weight has now trended down to 221, 8 pounds down from her discharge.  She continues to have rales in her bases.  Her volume status is very tricky as her chest x-ray, BNP, echo results, and lung exam really do not display signs or symptoms of severe volume overload.  At her last admission, we also checked a urine creatinine to protein ratio which was not consistent with nephrotic syndrome.  Additionally, her CO2 is increasing and that may be difficult for continued diuresis but I do feel we need to push diuresis to get her closer to her resume the dry weight of 213 pounds.  1.  Acute on chronic diastolic heart failure -we will continue to push diuresis with IV Lasix until her creatinine bumps with a goal of getting her weight to 213 pounds.   Her CO2 is elevated and might make diuresis challenging.  We can consider Acetazolamide to treat the metabolic alkalosis if needed as I do not think she has chronic CO2 retention and  therefore a low chance of inducing hypoventilation and hypoxia.  2.  Secondary adrenal insufficiency -we are continuing her Florinef and doubling her home hydrocortisone due to the current bodily stress.  3.  Right lower quadrant hematoma -her hemoglobin is stable despite the hematoma spreading throughout her abdominal pannus.  Vascular surgery recommends nothing further other than observation.  4.  Peripheral arterial disease - ABI showed mild right (0.87) lower extremity arterial disease and severe (0.44) left lower extremity arterial disease.  She is on an aspirin and a high intensity statin.  She will need outpatient follow-up  Bartholomew Crews, MD 7/30/201911:10 AM

## 2017-10-25 DIAGNOSIS — E2749 Other adrenocortical insufficiency: Secondary | ICD-10-CM

## 2017-10-25 DIAGNOSIS — R58 Hemorrhage, not elsewhere classified: Secondary | ICD-10-CM

## 2017-10-25 DIAGNOSIS — I5043 Acute on chronic combined systolic (congestive) and diastolic (congestive) heart failure: Secondary | ICD-10-CM

## 2017-10-25 DIAGNOSIS — R42 Dizziness and giddiness: Secondary | ICD-10-CM

## 2017-10-25 DIAGNOSIS — E119 Type 2 diabetes mellitus without complications: Secondary | ICD-10-CM

## 2017-10-25 LAB — CBC
HCT: 31.5 % — ABNORMAL LOW (ref 36.0–46.0)
Hemoglobin: 9.8 g/dL — ABNORMAL LOW (ref 12.0–15.0)
MCH: 30.2 pg (ref 26.0–34.0)
MCHC: 31.1 g/dL (ref 30.0–36.0)
MCV: 97.2 fL (ref 78.0–100.0)
Platelets: 316 10*3/uL (ref 150–400)
RBC: 3.24 MIL/uL — AB (ref 3.87–5.11)
RDW: 15.1 % (ref 11.5–15.5)
WBC: 11 10*3/uL — ABNORMAL HIGH (ref 4.0–10.5)

## 2017-10-25 LAB — BASIC METABOLIC PANEL
Anion gap: 12 (ref 5–15)
BUN: 17 mg/dL (ref 8–23)
CHLORIDE: 88 mmol/L — AB (ref 98–111)
CO2: 38 mmol/L — ABNORMAL HIGH (ref 22–32)
Calcium: 9.6 mg/dL (ref 8.9–10.3)
Creatinine, Ser: 1.17 mg/dL — ABNORMAL HIGH (ref 0.44–1.00)
GFR calc Af Amer: 57 mL/min — ABNORMAL LOW (ref >60)
GFR calc non Af Amer: 49 mL/min — ABNORMAL LOW (ref >60)
Glucose, Bld: 207 mg/dL — ABNORMAL HIGH (ref 70–99)
POTASSIUM: 4.2 mmol/L (ref 3.5–5.1)
SODIUM: 138 mmol/L (ref 135–145)

## 2017-10-25 LAB — MAGNESIUM: Magnesium: 1.8 mg/dL (ref 1.7–2.4)

## 2017-10-25 LAB — GLUCOSE, CAPILLARY
GLUCOSE-CAPILLARY: 187 mg/dL — AB (ref 70–99)
GLUCOSE-CAPILLARY: 196 mg/dL — AB (ref 70–99)
GLUCOSE-CAPILLARY: 201 mg/dL — AB (ref 70–99)
GLUCOSE-CAPILLARY: 282 mg/dL — AB (ref 70–99)
Glucose-Capillary: 142 mg/dL — ABNORMAL HIGH (ref 70–99)
Glucose-Capillary: 305 mg/dL — ABNORMAL HIGH (ref 70–99)

## 2017-10-25 MED ORDER — INSULIN GLARGINE 100 UNIT/ML ~~LOC~~ SOLN
13.0000 [IU] | Freq: Every day | SUBCUTANEOUS | Status: DC
  Administered 2017-10-25: 13 [IU] via SUBCUTANEOUS
  Filled 2017-10-25: qty 0.13

## 2017-10-25 MED ORDER — DEXTROSE 5 % IV SOLN
120.0000 mg | INTRAVENOUS | Status: DC
  Administered 2017-10-25 (×2): 120 mg via INTRAVENOUS
  Filled 2017-10-25: qty 10
  Filled 2017-10-25: qty 2
  Filled 2017-10-25: qty 12

## 2017-10-25 NOTE — Progress Notes (Signed)
   Subjective: Ms. Stege was seen and evaluated at bedside this morning. She continues to feel better today. States her breathing is almost back at baseline. Dizziness is still present but improved. She still has lower abdominal and groin soreness at hematoma site and noted that the bruising spread to LLQ. No n/v. Endorses good urine output. She is adamant about making it to her neurology appointment tomorrow morning so we discussed diuresing another day with early discharge first thing in the morning and will likely be re-admitted to continue optimizing volume status.   Objective:  Vital signs in last 24 hours: Vitals:   10/24/17 1430 10/24/17 2050 10/25/17 0417 10/25/17 0900  BP: 123/63 (!) 113/53 125/75   Pulse: 81 79 69   Resp: 16 17 18    Temp: 97.9 F (36.6 C) (!) 97.5 F (36.4 C) 97.6 F (36.4 C)   TempSrc: Oral Oral Oral   SpO2: 94% 95% 97%   Weight:    220 lb (99.8 kg)   Physical exam General: awake, alert female sitting up in her chair in NAD CV: RRR; no murmurs, rubs or gallops Resp: no increased work of breathing; Basilar rales which are improved from yesterday; no wheezing or rhonchi  Ext: trace LE edema  Assessment/Plan:  Active Problems:   Hypokalemia   Hypomagnesemia  Combined CHF exacerbation (EF 60-65%, G1DD) Net output was - 118 yesterday after IV lasix dose. Weight today is 220 lbs which is lower than at discharge; previous dry weight ~213lbs. On exam her LE edema is significantly improved, though she still has basilar rales. SBPs stable in 120s today. --two time dose of IV lasix 120mg  -- will check BMET at midnight tonight -- patient was insistent on making her neurology appointment tomorrow morning; plan is to discharge her to make appointment with likely re-admission to continue optimizing volume status  --strict in/outs, daily weights; fluid restriction --continue holding entresto, coreg, imdur  Hypokalemia Hypomagnesemia: Resolved - K 4.2, Mag 1.8  this morning. --f/u 12 am Bmet and Mag prior to morning discharge, replete as indicated  Secondary adrenal insufficiency: BPs stable today --continue florinef 0.1mg  BID --has completed the 3 days of stress dose; will resume home regimen   RLQ hematoma: Patient with resolution of pseudoaneurysm, however has persistent and possibly spreading ecchymosis; U/S yesterday revealed ~30cm hematoma. She still has tenderness and pain at this site. Her Hgb is stable at 9.8 today. Vascular surgery evaluated and do not recommend evacuation at this time due to increased risk of infection compared to minimal benefit. Appreciate their input.  T2DM: Patient on Tresiba 40u qhs, aspart w/niacinamide sliding scale with meals, semaglutide at home. CBGs 140- low 200s   CAD HTN: --continue asa and plavix, atorvastatin  Hypothyroidism: Patient continued on home levothyroxine 159mcg daily  COPD on chronic 2L [place]: Does not appear to be in acute exacerbation. --continue home Trelegy    Dispo: Anticipated discharge tomorrow morning for neurology appt   Delice Bison, DO 10/25/2017, 2:24 PM Pager: [phone]

## 2017-10-25 NOTE — Progress Notes (Signed)
Has been up walking in the hall and making slow improvement.  Right groin unchanged with bruising and hematoma present.  Discussed with patient will take quite some time to completely resolve this large hematoma.  Will not follow actively.  Please let us know if we can provide any assistance

## 2017-10-25 NOTE — Progress Notes (Signed)
Inpatient Diabetes Program Recommendations  AACE/ADA: New Consensus Statement on Inpatient Glycemic Control (2015)  Target Ranges:  Prepandial:   less than 140 mg/dL      Peak postprandial:   less than 180 mg/dL (1-2 hours)      Critically ill patients:  140 - 180 mg/dL   Review of Glycemic Control  Diabetes history: DM 2 Outpatient Diabetes medications: Semaglutide 0.5 mg monthly, Trulicity 1.5 mg Q Sunday, Tresiba 40 units, Fiasp 2-14 units tid Current orders for Inpatient glycemic control: Lantus 10 units, Novolog 0-9 units tid  Inpatient Diabetes Program Recommendations:    Patient ordered Cortef PO 30 mg qam, 10 mg qpm. Glucose trends elevated still above inpatient goal. Patient takes more basal insulin at home. Consider increasing Lantus to 15 units.  Thanks,  Tama Headings RN, MSN, BC-ADM Inpatient Diabetes Coordinator Team Pager [phone] (8a-5p)

## 2017-10-26 ENCOUNTER — Encounter (HOSPITAL_COMMUNITY): Payer: Self-pay | Admitting: Emergency Medicine

## 2017-10-26 ENCOUNTER — Ambulatory Visit (INDEPENDENT_AMBULATORY_CARE_PROVIDER_SITE_OTHER): Payer: Federal, State, Local not specified - PPO | Admitting: Neurology

## 2017-10-26 ENCOUNTER — Inpatient Hospital Stay (HOSPITAL_COMMUNITY)
Admission: EM | Admit: 2017-10-26 | Discharge: 2017-10-27 | Disposition: A | Discharge status: Home / Self Care | Payer: Federal, State, Local not specified - PPO | Source: Home / Self Care | Attending: Oncology | Admitting: Oncology

## 2017-10-26 ENCOUNTER — Other Ambulatory Visit: Payer: Self-pay

## 2017-10-26 ENCOUNTER — Encounter: Payer: Self-pay | Admitting: Neurology

## 2017-10-26 ENCOUNTER — Telehealth: Payer: Self-pay | Admitting: *Deleted

## 2017-10-26 ENCOUNTER — Encounter

## 2017-10-26 VITALS — BP 110/70 | HR 67 | Ht 62.0 in | Wt 219.0 lb

## 2017-10-26 DIAGNOSIS — E890 Postprocedural hypothyroidism: Secondary | ICD-10-CM

## 2017-10-26 DIAGNOSIS — E1122 Type 2 diabetes mellitus with diabetic chronic kidney disease: Secondary | ICD-10-CM | POA: Diagnosis present

## 2017-10-26 DIAGNOSIS — J439 Emphysema, unspecified: Secondary | ICD-10-CM | POA: Diagnosis present

## 2017-10-26 DIAGNOSIS — G43009 Migraine without aura, not intractable, without status migrainosus: Secondary | ICD-10-CM

## 2017-10-26 DIAGNOSIS — Z794 Long term (current) use of insulin: Secondary | ICD-10-CM

## 2017-10-26 DIAGNOSIS — Y838 Other surgical procedures as the cause of abnormal reaction of the patient, or of later complication, without mention of misadventure at the time of the procedure: Secondary | ICD-10-CM

## 2017-10-26 DIAGNOSIS — E119 Type 2 diabetes mellitus without complications: Secondary | ICD-10-CM

## 2017-10-26 DIAGNOSIS — R42 Dizziness and giddiness: Secondary | ICD-10-CM

## 2017-10-26 DIAGNOSIS — Z7952 Long term (current) use of systemic steroids: Secondary | ICD-10-CM

## 2017-10-26 DIAGNOSIS — Z888 Allergy status to other drugs, medicaments and biological substances status: Secondary | ICD-10-CM

## 2017-10-26 DIAGNOSIS — Z8679 Personal history of other diseases of the circulatory system: Secondary | ICD-10-CM

## 2017-10-26 DIAGNOSIS — I130 Hypertensive heart and chronic kidney disease with heart failure and stage 1 through stage 4 chronic kidney disease, or unspecified chronic kidney disease: Secondary | ICD-10-CM | POA: Diagnosis present

## 2017-10-26 DIAGNOSIS — E8779 Other fluid overload: Secondary | ICD-10-CM

## 2017-10-26 DIAGNOSIS — I2510 Atherosclerotic heart disease of native coronary artery without angina pectoris: Secondary | ICD-10-CM

## 2017-10-26 DIAGNOSIS — R0989 Other specified symptoms and signs involving the circulatory and respiratory systems: Secondary | ICD-10-CM

## 2017-10-26 DIAGNOSIS — Z91041 Radiographic dye allergy status: Secondary | ICD-10-CM

## 2017-10-26 DIAGNOSIS — I10 Essential (primary) hypertension: Secondary | ICD-10-CM | POA: Diagnosis present

## 2017-10-26 DIAGNOSIS — I252 Old myocardial infarction: Secondary | ICD-10-CM

## 2017-10-26 DIAGNOSIS — N182 Chronic kidney disease, stage 2 (mild): Secondary | ICD-10-CM

## 2017-10-26 DIAGNOSIS — I97630 Postprocedural hematoma of a circulatory system organ or structure following a cardiac catheterization: Secondary | ICD-10-CM | POA: Diagnosis present

## 2017-10-26 DIAGNOSIS — Z720 Tobacco use: Secondary | ICD-10-CM

## 2017-10-26 DIAGNOSIS — E876 Hypokalemia: Secondary | ICD-10-CM | POA: Diagnosis present

## 2017-10-26 DIAGNOSIS — Z7982 Long term (current) use of aspirin: Secondary | ICD-10-CM

## 2017-10-26 DIAGNOSIS — S36892D Contusion of other intra-abdominal organs, subsequent encounter: Secondary | ICD-10-CM

## 2017-10-26 DIAGNOSIS — Z79899 Other long term (current) drug therapy: Secondary | ICD-10-CM

## 2017-10-26 DIAGNOSIS — Z7951 Long term (current) use of inhaled steroids: Secondary | ICD-10-CM

## 2017-10-26 DIAGNOSIS — Z79890 Hormone replacement therapy (postmenopausal): Secondary | ICD-10-CM

## 2017-10-26 DIAGNOSIS — Z6841 Body Mass Index (BMI) 40.0 and over, adult: Secondary | ICD-10-CM

## 2017-10-26 DIAGNOSIS — F17210 Nicotine dependence, cigarettes, uncomplicated: Secondary | ICD-10-CM | POA: Diagnosis present

## 2017-10-26 DIAGNOSIS — I110 Hypertensive heart disease with heart failure: Secondary | ICD-10-CM

## 2017-10-26 DIAGNOSIS — Z91048 Other nonmedicinal substance allergy status: Secondary | ICD-10-CM

## 2017-10-26 DIAGNOSIS — S31603D Unspecified open wound of abdominal wall, right lower quadrant with penetration into peritoneal cavity, subsequent encounter: Secondary | ICD-10-CM

## 2017-10-26 DIAGNOSIS — T82855A Stenosis of coronary artery stent, initial encounter: Secondary | ICD-10-CM

## 2017-10-26 DIAGNOSIS — I259 Chronic ischemic heart disease, unspecified: Secondary | ICD-10-CM

## 2017-10-26 DIAGNOSIS — I5043 Acute on chronic combined systolic (congestive) and diastolic (congestive) heart failure: Secondary | ICD-10-CM

## 2017-10-26 DIAGNOSIS — J449 Chronic obstructive pulmonary disease, unspecified: Secondary | ICD-10-CM

## 2017-10-26 DIAGNOSIS — Z955 Presence of coronary angioplasty implant and graft: Secondary | ICD-10-CM

## 2017-10-26 DIAGNOSIS — E669 Obesity, unspecified: Secondary | ICD-10-CM

## 2017-10-26 DIAGNOSIS — Z9981 Dependence on supplemental oxygen: Secondary | ICD-10-CM

## 2017-10-26 DIAGNOSIS — I255 Ischemic cardiomyopathy: Secondary | ICD-10-CM | POA: Diagnosis present

## 2017-10-26 DIAGNOSIS — E2749 Other adrenocortical insufficiency: Secondary | ICD-10-CM

## 2017-10-26 DIAGNOSIS — E063 Autoimmune thyroiditis: Secondary | ICD-10-CM | POA: Diagnosis present

## 2017-10-26 DIAGNOSIS — R0609 Other forms of dyspnea: Secondary | ICD-10-CM

## 2017-10-26 DIAGNOSIS — R0600 Dyspnea, unspecified: Secondary | ICD-10-CM | POA: Diagnosis present

## 2017-10-26 DIAGNOSIS — Z7902 Long term (current) use of antithrombotics/antiplatelets: Secondary | ICD-10-CM

## 2017-10-26 DIAGNOSIS — I5023 Acute on chronic systolic (congestive) heart failure: Secondary | ICD-10-CM | POA: Diagnosis present

## 2017-10-26 DIAGNOSIS — E8770 Fluid overload, unspecified: Secondary | ICD-10-CM | POA: Diagnosis present

## 2017-10-26 DIAGNOSIS — Y840 Cardiac catheterization as the cause of abnormal reaction of the patient, or of later complication, without mention of misadventure at the time of the procedure: Secondary | ICD-10-CM

## 2017-10-26 LAB — BASIC METABOLIC PANEL
ANION GAP: 12 (ref 5–15)
ANION GAP: 10 (ref 5–15)
Anion gap: 12 (ref 5–15)
BUN: 21 mg/dL (ref 8–23)
BUN: 21 mg/dL (ref 8–23)
BUN: 22 mg/dL (ref 8–23)
CALCIUM: 9.3 mg/dL (ref 8.9–10.3)
CHLORIDE: 89 mmol/L — AB (ref 98–111)
CO2: 37 mmol/L — AB (ref 22–32)
CO2: 39 mmol/L — ABNORMAL HIGH (ref 22–32)
CO2: 38 mmol/L — AB (ref 22–32)
CREATININE: 1.25 mg/dL — AB (ref 0.44–1.00)
Calcium: 9.1 mg/dL (ref 8.9–10.3)
Calcium: 9.5 mg/dL (ref 8.9–10.3)
Chloride: 88 mmol/L — ABNORMAL LOW (ref 98–111)
Chloride: 89 mmol/L — ABNORMAL LOW (ref 98–111)
Creatinine, Ser: 1.14 mg/dL — ABNORMAL HIGH (ref 0.44–1.00)
Creatinine, Ser: 1.14 mg/dL — ABNORMAL HIGH (ref 0.44–1.00)
GFR calc Af Amer: 58 mL/min — ABNORMAL LOW (ref >60)
GFR calc Af Amer: 58 mL/min — ABNORMAL LOW (ref >60)
GFR calc non Af Amer: 45 mL/min — ABNORMAL LOW (ref >60)
GFR, EST AFRICAN AMERICAN: 52 mL/min — AB (ref >60)
GFR, EST NON AFRICAN AMERICAN: 50 mL/min — AB (ref >60)
GFR, EST NON AFRICAN AMERICAN: 50 mL/min — AB (ref >60)
GLUCOSE: 193 mg/dL — AB (ref 70–99)
GLUCOSE: 173 mg/dL — AB (ref 70–99)
GLUCOSE: 160 mg/dL — AB (ref 70–99)
POTASSIUM: 3.3 mmol/L — AB (ref 3.5–5.1)
Potassium: 3.1 mmol/L — ABNORMAL LOW (ref 3.5–5.1)
Potassium: 3.1 mmol/L — ABNORMAL LOW (ref 3.5–5.1)
SODIUM: 138 mmol/L (ref 135–145)
Sodium: 138 mmol/L (ref 135–145)
Sodium: 138 mmol/L (ref 135–145)

## 2017-10-26 LAB — MAGNESIUM
MAGNESIUM: 1.9 mg/dL (ref 1.7–2.4)
Magnesium: 1.7 mg/dL (ref 1.7–2.4)

## 2017-10-26 LAB — CBC
HCT: 29.3 % — ABNORMAL LOW (ref 36.0–46.0)
Hemoglobin: 9.2 g/dL — ABNORMAL LOW (ref 12.0–15.0)
MCH: 30.3 pg (ref 26.0–34.0)
MCHC: 31.4 g/dL (ref 30.0–36.0)
MCV: 96.4 fL (ref 78.0–100.0)
PLATELETS: 289 10*3/uL (ref 150–400)
RBC: 3.04 MIL/uL — ABNORMAL LOW (ref 3.87–5.11)
RDW: 14.9 % (ref 11.5–15.5)
WBC: 10.8 10*3/uL — ABNORMAL HIGH (ref 4.0–10.5)

## 2017-10-26 LAB — I-STAT TROPONIN, ED: Troponin i, poc: 0.02 ng/mL (ref 0.00–0.08)

## 2017-10-26 LAB — GLUCOSE, CAPILLARY
GLUCOSE-CAPILLARY: 162 mg/dL — AB (ref 70–99)
Glucose-Capillary: 140 mg/dL — ABNORMAL HIGH (ref 70–99)
Glucose-Capillary: 266 mg/dL — ABNORMAL HIGH (ref 70–99)

## 2017-10-26 MED ORDER — TRAZODONE HCL 50 MG PO TABS
25.0000 mg | ORAL_TABLET | Freq: Once | ORAL | Status: AC
  Administered 2017-10-26: 25 mg via ORAL
  Filled 2017-10-26: qty 1

## 2017-10-26 MED ORDER — ATORVASTATIN CALCIUM 80 MG PO TABS
80.0000 mg | ORAL_TABLET | Freq: Every day | ORAL | Status: DC
  Administered 2017-10-26 – 2017-10-27 (×2): 80 mg via ORAL
  Filled 2017-10-26 (×2): qty 1

## 2017-10-26 MED ORDER — OXYBUTYNIN CHLORIDE ER 15 MG PO TB24
15.0000 mg | ORAL_TABLET | Freq: Every day | ORAL | Status: DC
  Administered 2017-10-26: 15 mg via ORAL
  Filled 2017-10-26 (×2): qty 1

## 2017-10-26 MED ORDER — ADULT MULTIVITAMIN W/MINERALS CH
1.0000 | ORAL_TABLET | Freq: Every day | ORAL | Status: DC
  Administered 2017-10-26 – 2017-10-27 (×2): 1 via ORAL
  Filled 2017-10-26 (×2): qty 1

## 2017-10-26 MED ORDER — IPRATROPIUM-ALBUTEROL 0.5-2.5 (3) MG/3ML IN SOLN: 3.0000 mL | Freq: Four times a day (QID) | RESPIRATORY_TRACT | Status: DC | PRN

## 2017-10-26 MED ORDER — PRAMIPEXOLE DIHYDROCHLORIDE 1 MG PO TABS
1.0000 mg | ORAL_TABLET | Freq: Two times a day (BID) | ORAL | Status: DC
  Administered 2017-10-26 – 2017-10-27 (×3): 1 mg via ORAL
  Filled 2017-10-26 (×4): qty 1

## 2017-10-26 MED ORDER — FLUDROCORTISONE ACETATE 0.1 MG PO TABS: 0.1000 mg | ORAL_TABLET | Freq: Two times a day (BID) | ORAL | 2 refills | Status: DC

## 2017-10-26 MED ORDER — HYDROCORTISONE 10 MG PO TABS
15.0000 mg | ORAL_TABLET | Freq: Every day | ORAL | Status: DC
  Administered 2017-10-26 – 2017-10-27 (×2): 15 mg via ORAL
  Filled 2017-10-26 (×3): qty 1

## 2017-10-26 MED ORDER — SODIUM CHLORIDE 0.9 % IV SOLN
INTRAVENOUS | Status: DC | PRN
  Administered 2017-10-26: 250 mL via INTRAVENOUS

## 2017-10-26 MED ORDER — FLUTICASONE FUROATE-VILANTEROL 100-25 MCG/INH IN AEPB
1.0000 | INHALATION_SPRAY | Freq: Every day | RESPIRATORY_TRACT | Status: DC
  Administered 2017-10-26 – 2017-10-27 (×2): 1 via RESPIRATORY_TRACT
  Filled 2017-10-26: qty 28

## 2017-10-26 MED ORDER — NICOTINE 21 MG/24HR TD PT24
21.0000 mg | MEDICATED_PATCH | Freq: Every day | TRANSDERMAL | Status: DC
Start: 2017-10-26 — End: 2017-10-28
  Administered 2017-10-26 – 2017-10-27 (×2): 21 mg via TRANSDERMAL
  Filled 2017-10-26 (×2): qty 1

## 2017-10-26 MED ORDER — ACETAMINOPHEN 325 MG PO TABS: 650.0000 mg | ORAL_TABLET | Freq: Four times a day (QID) | ORAL | Status: DC | PRN

## 2017-10-26 MED ORDER — MAGNESIUM SULFATE 2 GM/50ML IV SOLN
2.0000 g | Freq: Once | INTRAVENOUS | Status: AC
  Administered 2017-10-26: 2 g via INTRAVENOUS
  Filled 2017-10-26: qty 50

## 2017-10-26 MED ORDER — FLUDROCORTISONE ACETATE 0.1 MG PO TABS
0.1000 mg | ORAL_TABLET | Freq: Two times a day (BID) | ORAL | Status: DC
  Administered 2017-10-26 – 2017-10-27 (×3): 0.1 mg via ORAL
  Filled 2017-10-26 (×4): qty 1

## 2017-10-26 MED ORDER — HYDROCORTISONE 5 MG PO TABS
5.0000 mg | ORAL_TABLET | Freq: Every evening | ORAL | Status: DC
  Administered 2017-10-26 – 2017-10-27 (×2): 5 mg via ORAL
  Filled 2017-10-26 (×2): qty 1

## 2017-10-26 MED ORDER — POTASSIUM CHLORIDE CRYS ER 20 MEQ PO TBCR: 40.0000 meq | EXTENDED_RELEASE_TABLET | ORAL | Status: DC

## 2017-10-26 MED ORDER — POTASSIUM CHLORIDE CRYS ER 20 MEQ PO TBCR
40.0000 meq | EXTENDED_RELEASE_TABLET | Freq: Once | ORAL | Status: AC
  Administered 2017-10-26: 40 meq via ORAL
  Filled 2017-10-26: qty 2

## 2017-10-26 MED ORDER — UMECLIDINIUM BROMIDE 62.5 MCG/INH IN AEPB
1.0000 | INHALATION_SPRAY | Freq: Every day | RESPIRATORY_TRACT | Status: DC
  Administered 2017-10-26 – 2017-10-27 (×2): 1 via RESPIRATORY_TRACT
  Filled 2017-10-26: qty 7

## 2017-10-26 MED ORDER — FUROSEMIDE 10 MG/ML IJ SOLN
120.0000 mg | Freq: Two times a day (BID) | INTRAVENOUS | Status: DC
  Administered 2017-10-26 – 2017-10-27 (×3): 120 mg via INTRAVENOUS
  Filled 2017-10-26: qty 2
  Filled 2017-10-26: qty 12
  Filled 2017-10-26: qty 2
  Filled 2017-10-26: qty 10

## 2017-10-26 MED ORDER — ENOXAPARIN SODIUM 40 MG/0.4ML ~~LOC~~ SOLN
40.0000 mg | SUBCUTANEOUS | Status: DC
  Administered 2017-10-26: 40 mg via SUBCUTANEOUS
  Filled 2017-10-26: qty 0.4

## 2017-10-26 MED ORDER — FLUTICASONE-UMECLIDIN-VILANT 100-62.5-25 MCG/INH IN AEPB: 1.0000 | INHALATION_SPRAY | Freq: Every day | RESPIRATORY_TRACT | Status: DC

## 2017-10-26 MED ORDER — FOLIC ACID 1 MG PO TABS
1.0000 mg | ORAL_TABLET | Freq: Every day | ORAL | Status: DC
  Administered 2017-10-26 – 2017-10-27 (×2): 1 mg via ORAL
  Filled 2017-10-26 (×2): qty 1

## 2017-10-26 MED ORDER — ASPIRIN 81 MG PO CHEW
81.0000 mg | CHEWABLE_TABLET | Freq: Every day | ORAL | Status: DC
  Administered 2017-10-26 – 2017-10-27 (×2): 81 mg via ORAL
  Filled 2017-10-26 (×2): qty 1

## 2017-10-26 MED ORDER — INSULIN GLARGINE 100 UNIT/ML ~~LOC~~ SOLN
16.0000 [IU] | Freq: Every day | SUBCUTANEOUS | Status: DC
  Administered 2017-10-26: 16 [IU] via SUBCUTANEOUS
  Filled 2017-10-26 (×2): qty 0.16

## 2017-10-26 MED ORDER — INSULIN ASPART 100 UNIT/ML ~~LOC~~ SOLN
0.0000 [IU] | Freq: Three times a day (TID) | SUBCUTANEOUS | Status: DC
  Administered 2017-10-26: 2 [IU] via SUBCUTANEOUS
  Administered 2017-10-26 – 2017-10-27 (×2): 8 [IU] via SUBCUTANEOUS
  Administered 2017-10-27 (×2): 3 [IU] via SUBCUTANEOUS

## 2017-10-26 MED ORDER — POLYETHYLENE GLYCOL 3350 17 G PO PACK: 17.0000 g | PACK | Freq: Every day | ORAL | Status: DC | PRN

## 2017-10-26 MED ORDER — VITAMIN B-1 100 MG PO TABS
100.0000 mg | ORAL_TABLET | Freq: Every day | ORAL | Status: DC
  Administered 2017-10-26 – 2017-10-27 (×2): 100 mg via ORAL
  Filled 2017-10-26 (×2): qty 1

## 2017-10-26 MED ORDER — POTASSIUM CHLORIDE CRYS ER 20 MEQ PO TBCR
40.0000 meq | EXTENDED_RELEASE_TABLET | ORAL | Status: AC
  Administered 2017-10-26 (×2): 40 meq via ORAL
  Filled 2017-10-26 (×2): qty 2

## 2017-10-26 MED ORDER — DULOXETINE HCL 60 MG PO CPEP
60.0000 mg | ORAL_CAPSULE | Freq: Every day | ORAL | Status: DC
  Administered 2017-10-26 – 2017-10-27 (×2): 60 mg via ORAL
  Filled 2017-10-26 (×2): qty 1

## 2017-10-26 MED ORDER — HYDROCODONE-ACETAMINOPHEN 5-325 MG PO TABS
1.0000 | ORAL_TABLET | Freq: Four times a day (QID) | ORAL | Status: DC | PRN
  Administered 2017-10-26: 1 via ORAL
  Administered 2017-10-27: 2 via ORAL
  Filled 2017-10-26: qty 2
  Filled 2017-10-26: qty 1

## 2017-10-26 MED ORDER — CLOPIDOGREL BISULFATE 75 MG PO TABS
75.0000 mg | ORAL_TABLET | Freq: Every day | ORAL | Status: DC
  Administered 2017-10-26 – 2017-10-27 (×2): 75 mg via ORAL
  Filled 2017-10-26 (×2): qty 1

## 2017-10-26 MED ORDER — LEVOTHYROXINE SODIUM 75 MCG PO TABS
150.0000 ug | ORAL_TABLET | Freq: Every day | ORAL | Status: DC
  Administered 2017-10-27: 150 ug via ORAL
  Filled 2017-10-26: qty 2

## 2017-10-26 MED ORDER — ACETAMINOPHEN 650 MG RE SUPP: 650.0000 mg | Freq: Four times a day (QID) | RECTAL | Status: DC | PRN

## 2017-10-26 NOTE — ED Triage Notes (Signed)
Pt has been admitted in the hospital since 7/29 when she was admitted for sob and low potassium but requested to be discharged due to neurology appointment. Pt has d/c papers from 4am this morning that state she is being discharged with the high likely hood of being re-admitted after her appointment. Pt arrives to ED after her appointment this morning for readmission. Pt states she has no new complaints she is just still sob and on her home o2 on 2L [place].

## 2017-10-26 NOTE — ED Provider Notes (Signed)
[facility] EMERGENCY DEPARTMENT Provider Note   CSN: [id number] Arrival date & time: 10/26/17  2505     History   Chief Complaint Chief Complaint  Patient presents with  . Shortness of Breath    HPI Crystal Yates is a 62 y.o. female.  62 yo F with a cc of sob. Patient has been in the hospital with this and has been having fluid removed and her electrolytes managed.  She was discharged today so that she could go to a neurologist appointment and now she is back for readmission.   The history is provided by the patient.  Shortness of Breath  This is a new problem. The average episode lasts 2 weeks. The problem occurs continuously.The problem has been gradually improving. Associated symptoms include leg swelling. Pertinent negatives include no fever, no headaches, no rhinorrhea, no wheezing, no chest pain and no vomiting. She has tried nothing for the symptoms. The treatment provided no relief. She has had no prior hospitalizations. She has had no prior ED visits. She has had no prior ICU admissions.    Past Medical History:  Diagnosis Date  . Acute on chronic systolic heart failure exacerbation(HCC) 04/08/2016  . CAD in native artery    a. Prior LAD stenting based on cath. b. RCA stenting 03/2016 x2.  . Chronic combined systolic and diastolic CHF (congestive heart failure) ([place])   . CKD (chronic kidney disease), stage II   . COPD (chronic obstructive pulmonary disease) ([place])   . Diabetes mellitus without complication ([place])   . Hashimoto's thyroiditis   . Hyperlipidemia   . Hypertension   . Myocardial infarction ([place])   . On home oxygen therapy    "2L; 24/7" (10/23/2017)  . Secondary adrenal insufficiency ([place])   . Thyroid disease   . Tobacco abuse     Patient Active Problem List   Diagnosis Date Noted  . Hypomagnesemia   . Volume overload   . Pseudoaneurysm following procedure ([place])   . Pseudoaneurysm ([place]) 10/17/2017  . S/P angioplasty for  ISR of dRCA 10/13/17 10/14/2017  . CAD (coronary artery disease) 10/13/2017  . Preop examination 10/10/2017  . Acute respiratory distress 09/16/2017  . Right upper lobe pneumonia ([place]) 05/06/2017  . Pneumonia due to respiratory syncytial virus (RSV) 04/21/2017  . Entrapment, radial nerve, right 03/31/2017  . COPD exacerbation ([place]) 10/14/2016  . Chronic combined systolic and diastolic CHF (congestive heart failure) ([place]) 10/13/2016  . Chest tightness or pressure 10/12/2016  . Dyspnea on exertion 09/29/2016  . History of coronary artery disease 09/29/2016  . OAB (overactive bladder) 09/21/2016  . Neck pain 09/08/2016  . Paresthesia of arm 09/08/2016  . Flank pain 08/31/2016  . Oxygen dependent 06/20/2016  . Neuralgia of right upper extremity 04/26/2016  . Coronary artery disease 04/16/2016  . Lymphocele after surgical procedure 04/16/2016  . Ventricular tachycardia (paroxysmal) ([place]) 04/09/2016  . Acute on chronic systolic heart failure exacerbation(HCC) 04/08/2016  . Ischemic cardiomyopathy 04/08/2016  . Type 2 diabetes mellitus without complication, with long-term current use of insulin ([place]) 04/08/2016  . Anemia, blood loss 09/16/2015  . GI bleeding 09/16/2015  . Precordial pain 09/14/2015  . Chest pain 05/22/2015  . Hypokalemia 05/22/2015  . Hypothyroid 05/22/2015  . Status post hernia repair 01/29/2015  . Surgical wound breakdown 01/29/2015  . Hernia with strangulation 01/18/2015  . Hyperkalemia 01/18/2015  . Tobacco abuse 01/18/2015  . Recurrent incisional hernia with incarceration 01/15/2015  . Sleep apnea 01/15/2015  . S/P  drug eluting coronary stent placement 10/16/2014  . Adrenal insufficiency ([place]) 11/21/2013  . Anemia 11/21/2013  . Unstable angina ([place]) 11/21/2013  . Atherosclerotic heart disease of native coronary artery without angina pectoris 11/21/2013  . Cardiomyopathy ([place]) 11/21/2013  . Congestive heart failure ([place]) 11/21/2013  . Diabetes mellitus with no  complication ([place]) 38/12/1749  . Edema 11/21/2013  . Empty sella syndrome ([place]) 11/21/2013  . Hepatitis 11/21/2013  . Hyperlipemia 11/21/2013  . Hypertension 11/21/2013  . Kidney cyst, acquired 11/21/2013  . Lymph nodes enlarged 11/21/2013  . Fat necrosis 11/21/2013  . Other emphysema ([place]) 11/21/2013  . Renal disorder 11/21/2013  . Shortness of breath 11/21/2013  . Sjogrens syndrome ([place]) 11/21/2013  . Thyroid disorder 11/21/2013  . Nicotine dependence, uncomplicated 02/58/5277  . Major depressive disorder, recurrent episode ([place]) 09/17/2013  . Displacement of lumbar intervertebral disc 06/14/2013  . Intractable migraine without aura 06/14/2013  . Mild cognitive impairment 06/14/2013  . Polyneuropathy in diabetes ([place]) 06/14/2013  . Adrenal cortex insufficiency ([place]) 04/12/2011  . DDD (degenerative disc disease), lumbosacral 01/11/2008  . Thoracic or lumbosacral neuritis or radiculitis 01/11/2008    Past Surgical History:  Procedure Laterality Date  . ABDOMINAL SURGERY    . CESAREAN SECTION    . CHOLECYSTECTOMY    . COLON RESECTION    . CORONARY BALLOON ANGIOPLASTY N/A 10/13/2017   Procedure: CORONARY BALLOON ANGIOPLASTY;  Surgeon: Belva Crome, MD;  Location: [facility];  Service: Cardiovascular;  Laterality: N/A;  . HERNIA MESH REMOVAL    . HERNIA REPAIR    . LEFT HEART CATH AND CORONARY ANGIOGRAPHY N/A 10/13/2016   Procedure: Left Heart Cath and Coronary Angiography;  Surgeon: Nelva Bush, MD;  Location: [facility];  Service: Cardiovascular;  Laterality: N/A;  . RIGHT/LEFT HEART CATH AND CORONARY ANGIOGRAPHY N/A 10/13/2017   Procedure: RIGHT/LEFT HEART CATH AND CORONARY ANGIOGRAPHY;  Surgeon: Belva Crome, MD;  Location: [facility];  Service: Cardiovascular;  Laterality: N/A;  . SHOULDER ARTHROSCOPY    . TUBAL LIGATION       OB History   None      Home Medications    Prior to Admission medications   Medication Sig Start Date End  Date Taking? Authorizing Provider  acetaminophen (TYLENOL) 325 MG tablet Take 2 tablets (650 mg total) by mouth every 4 (four) hours as needed for headache or mild pain. 10/14/17  Yes Isaiah Serge, NP  aspirin 81 MG chewable tablet Chew 1 tablet (81 mg total) by mouth daily. 10/14/17  Yes Isaiah Serge, NP  atorvastatin (LIPITOR) 80 MG tablet Take 1 tablet (80 mg total) by mouth daily at 6 PM. 10/14/17  Yes Isaiah Serge, NP  CALCIUM PO Take 1 tablet by mouth daily.   Yes [provider]  Cholecalciferol (VITAMIN D3) 2000 units capsule Take 2,000 Units by mouth daily.    Yes [provider]  clopidogrel (PLAVIX) 75 MG tablet Take 75 mg by mouth daily.   Yes [provider]  DULoxetine (CYMBALTA) 60 MG capsule Take 60 mg by mouth once a day 12/05/16  Yes [provider]  EMGALITY 120 MG/ML SOAJ Inject 120 mg into the skin every 30 (thirty) days.  07/30/17  Yes [provider]  fludrocortisone (FLORINEF) 0.1 MG tablet Take 1 tablet (0.1 mg total) by mouth 2 (two) times daily. 10/26/17  Yes Neva Seat, MD  hydrocortisone (CORTEF) 5 MG tablet Take 5-15 mg by mouth See admin  instructions. Take 15 mg by mouth in the morning and 5 mg in the afternoon   Yes [provider]  Insulin Aspart, w/Niacinamide, (FIASP FLEXTOUCH) 100 UNIT/ML SOPN Inject 2-14 Units into the skin See admin instructions. Use three times day as needed before meals per sliding scale   Yes [provider]  insulin degludec (TRESIBA FLEXTOUCH) 100 UNIT/ML SOPN FlexTouch Pen Inject 40 Units into the skin at bedtime.  08/28/16  Yes [provider]  Iron-FA-B Cmp-C-Biot-Probiotic (FUSION PLUS) CAPS Take 1 capsule by mouth daily. 10/09/16  Yes [provider]  levothyroxine (SYNTHROID, LEVOTHROID) 150 MCG tablet Take 150 mcg by mouth daily before breakfast.   Yes [provider]  Magnesium 250 MG TABS Take 250 mg by mouth daily.   Yes [provider]  oxybutynin (DITROPAN XL) 15 MG 24 hr tablet Take 15 mg by mouth at bedtime.   Yes [provider]  Oxycodone HCl 10 MG TABS Take 0.5-1 tablets (5-10 mg total) by mouth every 6 (six) hours as needed. Patient taking differently: Take 5-10 mg by mouth every 6 (six) hours as needed (pain).  10/21/17  Yes Alphonzo Grieve, MD  potassium chloride SA (K-DUR,KLOR-CON) 20 MEQ tablet Take 1 tablet (20 mEq total) by mouth daily. 03/14/17  Yes Park Liter, MD  pramipexole (MIRAPEX) 1 MG tablet Take 1 mg by mouth 2 (two) times daily.   Yes [provider]  Semaglutide (OZEMPIC) 0.25 or 0.5 MG/DOSE SOPN Inject 0.5 mg into the skin every 30 (thirty) days.    Yes [provider]  torsemide (DEMADEX) 20 MG tablet Take 2 tablets (40 mg total) by mouth 2 (two) times daily. 10/21/17  Yes Alphonzo Grieve, MD  TRELEGY ELLIPTA 100-62.5-25 MCG/INH AEPB Inhale 1 puff into the lungs daily. 08/30/17  Yes [provider]  TRULICITY 1.5 HF/0.2OV SOPN Inject 1.5 mg into the skin every Sunday.  10/10/16  Yes [provider]  nitroGLYCERIN (NITROSTAT) 0.4 MG SL tablet Place 0.4 mg under the tongue every 5 (five) minutes as needed for chest pain.     [provider]    Family History Family History  Problem Relation Age of Onset  . Stroke Mother   . Diabetes Father   . Diabetes Sister   . Diabetes Sister   . Diabetes Son     Social History Social History   Tobacco Use  . Smoking status: Current Every Day Smoker    Packs/day: 0.75    Years: 44.00    Pack years: 33.00    Types: Cigarettes  . Smokeless tobacco: Never Used  Substance Use Topics  . Alcohol use: Yes    Alcohol/week: 4.2 oz    Types: 7 Shots of liquor per week  . Drug use: Yes    Types: Marijuana    Comment: 10/23/2017 "had 1 ~ 1 month ago; nothing before or since"     Allergies   Hydroxychloroquine; Donepezil; Prednisone; Bupropion; Metrizamide; Varenicline; and  Tape   Review of Systems Review of Systems  Constitutional: Negative for chills and fever.  HENT: Negative for congestion and rhinorrhea.   Eyes: Negative for redness and visual disturbance.  Respiratory: Positive for shortness of breath. Negative for wheezing.   Cardiovascular: Positive for leg swelling. Negative for chest pain and palpitations.  Gastrointestinal: Negative for nausea and vomiting.  Genitourinary: Negative for dysuria and urgency.  Musculoskeletal: Negative for arthralgias and myalgias.  Skin: Negative for pallor and wound.  Neurological: Negative for  dizziness and headaches.     Physical Exam Updated Vital Signs There were no vitals taken for this visit.  Physical Exam  Constitutional: She is oriented to person, place, and time. She appears well-developed and well-nourished. No distress.  HENT:  Head: Normocephalic and atraumatic.  Eyes: Pupils are equal, round, and reactive to light. EOM are normal.  Neck: Normal range of motion. Neck supple.  Cardiovascular: Normal rate and regular rhythm. Exam reveals no gallop and no friction rub.  No murmur heard. Pulmonary/Chest: Effort normal. She has no wheezes. She has no rales.  Abdominal: Soft. She exhibits no distension. There is no tenderness.  Musculoskeletal: She exhibits edema (trace to BLE). She exhibits no tenderness.       Right lower leg: She exhibits edema.       Left lower leg: She exhibits edema.  Neurological: She is alert and oriented to person, place, and time.  Skin: Skin is warm and dry. She is not diaphoretic.  Psychiatric: She has a normal mood and affect. Her behavior is normal.  Nursing note and vitals reviewed.    ED Treatments / Results  Labs (all labs ordered are listed, but only abnormal results are displayed) Labs Reviewed  BASIC METABOLIC PANEL - Abnormal; Notable for the following components:      Result Value   Potassium 3.3 (*)    Chloride 89 (*)    CO2 39 (*)    Glucose,  Bld 173 (*)    Creatinine, Ser 1.14 (*)    GFR calc non Af Amer 50 (*)    GFR calc Af Amer 58 (*)    All other components within normal limits  MAGNESIUM  I-STAT TROPONIN, ED    EKG None  Radiology No results found.  Procedures Procedures (including critical care time)  Medications Ordered in ED Medications  aspirin chewable tablet 81 mg (has no administration in time range)  atorvastatin (LIPITOR) tablet 80 mg (has no administration in time range)  clopidogrel (PLAVIX) tablet 75 mg (has no administration in time range)  DULoxetine (CYMBALTA) DR capsule 60 mg (has no administration in time range)  fludrocortisone (FLORINEF) tablet 0.1 mg (has no administration in time range)  levothyroxine (SYNTHROID, LEVOTHROID) tablet 150 mcg (has no administration in time range)  oxybutynin (DITROPAN XL) 24 hr tablet 15 mg (has no administration in time range)  pramipexole (MIRAPEX) tablet 1 mg (has no administration in time range)  Fluticasone-Umeclidin-Vilant 100-62.5-25 MCG/INH AEPB 1 puff (has no administration in time range)  hydrocortisone (CORTEF) tablet 15 mg (has no administration in time range)  hydrocortisone (CORTEF) tablet 5 mg (has no administration in time range)  enoxaparin (LOVENOX) injection 40 mg (has no administration in time range)  acetaminophen (TYLENOL) tablet 650 mg (has no administration in time range)    Or  acetaminophen (TYLENOL) suppository 650 mg (has no administration in time range)  HYDROcodone-acetaminophen (NORCO/VICODIN) 5-325 MG per tablet 1-2 tablet (has no administration in time range)  nicotine (NICODERM CQ - dosed in mg/24 hours) patch 21 mg (has no administration in time range)  thiamine (VITAMIN B-1) tablet 100 mg (has no administration in time range)  folic acid (FOLVITE) tablet 1 mg (has no administration in time range)  multivitamin with minerals tablet 1 tablet (has no administration in time range)  polyethylene glycol (MIRALAX / GLYCOLAX)  packet 17 g (has no administration in time range)  insulin aspart (novoLOG) injection 0-15 Units (has no administration in time range)  insulin glargine (LANTUS) injection  16 Units (has no administration in time range)     Initial Impression / Assessment and Plan / ED Course  I have reviewed the triage vital signs and the nursing notes.  Pertinent labs & imaging results that were available during my care of the patient were reviewed by me and considered in my medical decision making (see chart for details).     62 yo F with a chief complaint of shortness of breath.  The patient has been in the hospital and was discharged that she could see the neurologist today.  She was supposed to be a direct admit but by accident checked into the emergency department.  She is well-appearing and nontoxic on my exam.  No noted rales on lung exam.  Trace edema.  Not requiring oxygen.  Internal medicine residency program to admit.  The patients results and plan were reviewed and discussed.   Any x-rays performed were independently reviewed by myself.   Differential diagnosis were considered with the presenting HPI.  Medications  aspirin chewable tablet 81 mg (has no administration in time range)  atorvastatin (LIPITOR) tablet 80 mg (has no administration in time range)  clopidogrel (PLAVIX) tablet 75 mg (has no administration in time range)  DULoxetine (CYMBALTA) DR capsule 60 mg (has no administration in time range)  fludrocortisone (FLORINEF) tablet 0.1 mg (has no administration in time range)  levothyroxine (SYNTHROID, LEVOTHROID) tablet 150 mcg (has no administration in time range)  oxybutynin (DITROPAN XL) 24 hr tablet 15 mg (has no administration in time range)  pramipexole (MIRAPEX) tablet 1 mg (has no administration in time range)  Fluticasone-Umeclidin-Vilant 100-62.5-25 MCG/INH AEPB 1 puff (has no administration in time range)  hydrocortisone (CORTEF) tablet 15 mg (has no administration in time  range)  hydrocortisone (CORTEF) tablet 5 mg (has no administration in time range)  enoxaparin (LOVENOX) injection 40 mg (has no administration in time range)  acetaminophen (TYLENOL) tablet 650 mg (has no administration in time range)    Or  acetaminophen (TYLENOL) suppository 650 mg (has no administration in time range)  HYDROcodone-acetaminophen (NORCO/VICODIN) 5-325 MG per tablet 1-2 tablet (has no administration in time range)  nicotine (NICODERM CQ - dosed in mg/24 hours) patch 21 mg (has no administration in time range)  thiamine (VITAMIN B-1) tablet 100 mg (has no administration in time range)  folic acid (FOLVITE) tablet 1 mg (has no administration in time range)  multivitamin with minerals tablet 1 tablet (has no administration in time range)  polyethylene glycol (MIRALAX / GLYCOLAX) packet 17 g (has no administration in time range)  insulin aspart (novoLOG) injection 0-15 Units (has no administration in time range)  insulin glargine (LANTUS) injection 16 Units (has no administration in time range)    There were no vitals filed for this visit.  Final diagnoses:  Cardiac volume overload  Hypokalemia    Admission/ observation were discussed with the admitting physician, patient and/or family and they are comfortable with the plan.    Final Clinical Impressions(s) / ED Diagnoses   Final diagnoses:  Cardiac volume overload  Hypokalemia    ED Discharge Orders    None       Deno Etienne, DO 10/26/17 1051

## 2017-10-26 NOTE — Progress Notes (Signed)
Patient discharged at this time with husband. Patient and husband verbalize understanding of  Discharge instructions, home care and follow up care. Patient out of department at this time.

## 2017-10-26 NOTE — Patient Instructions (Addendum)
1.  Continue Emgality 2.  Treat migraines with tylenol if needed, limit use of pain relievers to no more than 2 days out of week to prevent rebound headache 3.  Follow up if headaches get worse. 4.  In the meantime, I recommend that you be readmitted to the hospital for your co-morbidities.

## 2017-10-26 NOTE — Progress Notes (Signed)
Occupational Therapy Evaluation Patient Details Name: Crystal Yates MRN: [id number] DOB: 07/28/1955 Today's Date: 10/26/2017    History of Present Illness Crystal Yates is a 62 y.o. female with CAD S/P PCI 1/18, HFrEF (40 %-1/19),CKD II, COPD( Home O2 2L),DM, HTN, secondary adrenal insufficiency, thyroid disease and a current active smoker (43 pack year) who presented with one week history of worsening dyspnea. Found to have acute respiratory distress   Clinical Impression   PTA, pt was living at home with husband, and was modified independent with ADLs and functional mobility with intermittent use of RW. PTA, pt was on 2lnc at night. Pt currently requires minguard for OOB functional mobilty due to increased SOB. Pt ambulated from bed to bathroom and reported SOB with return to recliner, maintained SpO2 >90% 2lnc.Pt able to complete ADLs at modified independent level. Due to deficits listed below (see OT problem list), pt would benefit from acute OT to address establish goals to facilitate safe D/C home without OT follow-up.     Follow Up Recommendations  No OT follow up;Supervision - Intermittent    Equipment Recommendations       Recommendations for Other Services PT consult     Precautions / Restrictions Restrictions Weight Bearing Restrictions: No      Mobility Bed Mobility Overal bed mobility: Modified Independent                Transfers Overall transfer level: Modified independent                    Balance Overall balance assessment: Needs assistance Sitting-balance support: No upper extremity supported Sitting balance-Leahy Scale: Good     Standing balance support: No upper extremity supported;During functional activity Standing balance-Leahy Scale: Fair Standing balance comment: pt requires S for further distances and more dynamic activities                           ADL either performed or assessed with clinical judgement    ADL Overall ADL's : At baseline     Grooming: Supervision/safety;Standing   Upper Body Bathing: Modified independent   Lower Body Bathing: Modified independent   Upper Body Dressing : Modified independent   Lower Body Dressing: Modified independent;With adaptive equipment Lower Body Dressing Details (indicate cue type and reason): pt reports using AE at baseline Toilet Transfer: Supervision/safety Toilet Transfer Details (indicate cue type and reason): ambulated to bathroom pushing IV pole Toileting- Clothing Manipulation and Hygiene: Modified independent   Tub/ Shower Transfer: Min guard   Functional mobility during ADLs: Supervision/safety General ADL Comments: Pt reports she feels she is close able to complete ADLs at prior level of functioning, pt completed ADLs with S for safety due to shortness of breath, no physical assistance provided for completion of tasks;     Vision Baseline Vision/History: Wears glasses Wears Glasses: At all times Patient Visual Report: No change from baseline       Perception     Praxis      Pertinent Vitals/Pain Pain Assessment: No/denies pain     Hand Dominance Left   Extremity/Trunk Assessment Upper Extremity Assessment Upper Extremity Assessment: Overall WFL for tasks assessed   Lower Extremity Assessment Lower Extremity Assessment: Defer to PT evaluation   Cervical / Trunk Assessment Cervical / Trunk Assessment: Normal   Communication Communication Communication: No difficulties   Cognition Arousal/Alertness: Awake/alert Behavior During Therapy: WFL for tasks assessed/performed Overall Cognitive Status: Within Functional Limits for  tasks assessed                                 General Comments: pt with good awareness of deficits, verbalized need for additional grab bars in bathroom, verbalized awareness of self-monitoring SpO2 levels and symptoms during ADLs   General Comments  Pt ambulated to bathroom  and reported she felt SOB with return to recliner, reports she would have further distance to ambulate to bathroom at home;maintained SpO2>90%;educated on pursed lip breathing;    Exercises     Shoulder Instructions      Home Living Family/patient expects to be discharged to:: Private residence Living Arrangements: Spouse/significant other Available Help at Discharge: Family;Available PRN/intermittently Type of Home: House Home Access: Stairs to enter [employer] of Steps: 4 Entrance Stairs-Rails: Left Home Layout: One level     Bathroom Shower/Tub: Walk-in shower;Tub/shower unit   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 How Accessible: Accessible via wheelchair;Accessible via walker Home Equipment: Shower seat;Walker - 4 wheels;Grab bars - tub/shower;Grab bars - toilet;Cane - single point          Prior Functioning/Environment Level of Independence: Needs assistance  Gait / Transfers Assistance Needed: used rollator at times ADL's / Homemaking Assistance Needed: husband assists with dressing as pt fatigues and get SOB easily;   Comments: pt was driving, but has not been recently, husband drives pt to appointments;pt had no difficulty with community mobility until last month        OT Problem List: Impaired balance (sitting and/or standing);Cardiopulmonary status limiting activity      OT Treatment/Interventions:      OT Goals(Current goals can be found in the care plan section) Acute Rehab OT Goals Patient Stated Goal: to go home OT Goal Formulation: With patient Time For Goal Achievement: 11/09/17 Potential to Achieve Goals: Good  OT Frequency:     Barriers to D/C:            Co-evaluation              AM-PAC PT "6 Clicks" Daily Activity     Outcome Measure Help from another person eating meals?: None Help from another person taking care of personal grooming?: None Help from another person toileting, which includes using  toliet, bedpan, or urinal?: A Little Help from another person bathing (including washing, rinsing, drying)?: A Little Help from another person to put on and taking off regular upper body clothing?: None Help from another person to put on and taking off regular lower body clothing?: None 6 Click Score: 22   End of Session Equipment Utilized During Treatment: Gait belt;Oxygen Nurse Communication: Mobility status  Activity Tolerance: Patient tolerated treatment well Patient left: in chair;with call bell/phone within reach  OT Visit Diagnosis: Other abnormalities of gait and mobility (R26.89)                Time: 4944-9675 OT Time Calculation (min): 26 min Charges:     Dorinda Hill OTS    Dorinda Hill 10/26/2017, 2:39 PM

## 2017-10-26 NOTE — Progress Notes (Signed)
The patient's Breo and Incruse have not arrived from pharmacy at this time. Will attempt to give medication again.

## 2017-10-26 NOTE — Progress Notes (Addendum)
NEUROLOGY CONSULTATION NOTE  Crystal Yates MRN: [id number] DOB: 10-16-1955  Referring provider: Dortha Kern, PA Primary care provider: Dortha Kern, PA  Reason for consult:  Headache, dizziness  HISTORY OF PRESENT ILLNESS: Crystal Yates is a 62 year old female with with ischemic cardiomyopathy, CAD, CHF, COPD, morbid obesity, tobacco use, Sjogren's syndrome, restless leg, depression type 2 diabetes mellitus and paroxysmal ventricular tachycardia who presents for migraines and dizziness.  She is accompanied by her husband who supplements history.  History supplemented by referring provider's note.  She began experiencing headaches in 2013.  She says she had no prior history of headaches.  She would experience episodes of shortness of breath with urinary incontinence followed by a severe stabbing and throbbing headache over her mid upper frontal region.  Sometimes it is associated with nausea.  There is no associated vomiting, photophobia, phonophobia, osmophobia, autonomic symptoms, visual disturbance or unilateral numbness or weakness.  It typically lasts 30 to 60 minutes.  It is often aggravated by exertion and relieved by rest.  Initially it would occur once to several times daily.  She started [employer] and they now occur 2 or 3 days a month.    Since then, she has developed other symptoms as well.  She occasionally has blurred vision.  Her ophthalmologist said it is expected changes associated with cataract and age.  She feels unsteady on her feet.  She also report dizziness.  It is not a spinning but rather a sensation of lightheadedness as if she is going to pass out.  It occasionally happens while sitting but typically occurs upon standing.  She is on Florinef.  Sometimes she may have jerking of her extremities.  She has restless leg syndrome.  She reports occasional tremor.  She had cervical spine surgery in August 2018.    She has been seen and treated by prior  neurologists in [place] and at [facility].  MRI of brain with and without contrast from 04/12/11 showed normal appearing brain and pituitary gland.  EEG from 11/04/15 to evaluate for possible seizures was normal.  NCV-EMG of right upper extremity from 09/23/16 was normal.    Current NSAIDS:  ASA 81mg  daily Current analgesics:  acetaminophen 650mg  Current triptans:  no Current ergotamine:  no Current anti-emetic:  no Current muscle relaxants:  no Current anti-anxiolytic:  no Current sleep aide:  no Current Antihypertensive medications:  Coreg, Lasix Current Antidepressant medications:  duloxetine 60mg  Current Anticonvulsant medications:  no Current anti-CGRP:  Emgality Current Vitamins/Herbal/Supplements:  K-dur Current Antihistamines/Decongestants:  no Other therapy:  no Other medication:  Plavix, Florinef, Cortef, Mirapex, Entresto, isosorbide  Past NSAIDS:  Mobic Past analgesics:  tramadol Past abortive triptans:  Maxalt MLT 10mg  Past abortive ergotamine:  no Past muscle relaxants:  no Past anti-emetic:  Zofran ODT 4mg  Past antihypertensive medications:  Lasix, Imdur Past antidepressant medications:  no Past anticonvulsant medications:  topiramate 300mg , gabapentin 600mg  at bedtime Past anti-CGRP:  no Past vitamins/Herbal/Supplements:  no Past antihistamines/decongestants:  no Other past therapies:  no  PAST MEDICAL HISTORY: Past Medical History:  Diagnosis Date  . Acute on chronic systolic heart failure exacerbation(HCC) 04/08/2016  . CAD in native artery    a. Prior LAD stenting based on cath. b. RCA stenting 03/2016 x2.  . Chronic combined systolic and diastolic CHF (congestive heart failure) ([place])   . CKD (chronic kidney disease), stage II   . COPD (chronic obstructive pulmonary disease) ([place])   . Diabetes mellitus without complication ([place])   .  Hashimoto's thyroiditis   . Hyperlipidemia   . Hypertension   . Myocardial infarction ([place])   . On home oxygen therapy    "2L;  24/7" (10/23/2017)  . Secondary adrenal insufficiency ([place])   . Thyroid disease   . Tobacco abuse     PAST SURGICAL HISTORY: Past Surgical History:  Procedure Laterality Date  . ABDOMINAL SURGERY    . CESAREAN SECTION    . CHOLECYSTECTOMY    . COLON RESECTION    . CORONARY BALLOON ANGIOPLASTY N/A 10/13/2017   Procedure: CORONARY BALLOON ANGIOPLASTY;  Surgeon: Belva Crome, MD;  Location: [place] [facility];  Service: Cardiovascular;  Laterality: N/A;  . HERNIA MESH REMOVAL    . HERNIA REPAIR    . LEFT HEART CATH AND CORONARY ANGIOGRAPHY N/A 10/13/2016   Procedure: Left Heart Cath and Coronary Angiography;  Surgeon: Nelva Bush, MD;  Location: [place] [facility];  Service: Cardiovascular;  Laterality: N/A;  . RIGHT/LEFT HEART CATH AND CORONARY ANGIOGRAPHY N/A 10/13/2017   Procedure: RIGHT/LEFT HEART CATH AND CORONARY ANGIOGRAPHY;  Surgeon: Belva Crome, MD;  Location: [place] [facility];  Service: Cardiovascular;  Laterality: N/A;  . SHOULDER ARTHROSCOPY    . TUBAL LIGATION      MEDICATIONS: Current Outpatient Medications on File Prior to Visit  Medication Sig Dispense Refill  . acetaminophen (TYLENOL) 325 MG tablet Take 2 tablets (650 mg total) by mouth every 4 (four) hours as needed for headache or mild pain.    Marland Kitchen aspirin 81 MG chewable tablet Chew 1 tablet (81 mg total) by mouth daily.    Marland Kitchen atorvastatin (LIPITOR) 80 MG tablet Take 1 tablet (80 mg total) by mouth daily at 6 PM. 30 tablet 6  . CALCIUM PO Take 1 tablet by mouth daily.    . Cholecalciferol (VITAMIN D3) 2000 units capsule Take 2,000 Units by mouth daily.     . clopidogrel (PLAVIX) 75 MG tablet Take 75 mg by mouth daily.    . DULoxetine (CYMBALTA) 60 MG capsule Take 60 mg by mouth once a day  0  . EMGALITY 120 MG/ML SOAJ Inject 120 mg into the skin every 30 (thirty) days.   3  . fludrocortisone (FLORINEF) 0.1 MG tablet Take 1 tablet (0.1 mg total) by mouth 2 (two) times daily. 60 tablet 2  . hydrocortisone  (CORTEF) 5 MG tablet Take 5-15 mg by mouth See admin instructions. Take 15 mg by mouth in the morning and 5 mg in the afternoon    . Insulin Aspart, w/Niacinamide, (FIASP FLEXTOUCH) 100 UNIT/ML SOPN Inject 2-14 Units into the skin See admin instructions. Use three times day as needed before meals per sliding scale    . insulin degludec (TRESIBA FLEXTOUCH) 100 UNIT/ML SOPN FlexTouch Pen Inject 40 Units into the skin at bedtime.     . Iron-FA-B Cmp-C-Biot-Probiotic (FUSION PLUS) CAPS Take 1 capsule by mouth daily.  5  . levothyroxine (SYNTHROID, LEVOTHROID) 150 MCG tablet Take 150 mcg by mouth daily before breakfast.    . Magnesium 250 MG TABS Take 250 mg by mouth daily.    . nitroGLYCERIN (NITROSTAT) 0.4 MG SL tablet Place 0.4 mg under the tongue every 5 (five) minutes as needed for chest pain.     Marland Kitchen oxybutynin (DITROPAN XL) 15 MG 24 hr tablet Take 15 mg by mouth at bedtime.    . Oxycodone HCl 10 MG TABS Take 0.5-1 tablets (5-10 mg total) by mouth every 6 (six) hours as needed. (Patient taking  differently: Take 5-10 mg by mouth every 6 (six) hours as needed (pain). ) 20 tablet 0  . potassium chloride SA (K-DUR,KLOR-CON) 20 MEQ tablet Take 1 tablet (20 mEq total) by mouth daily. 90 tablet 3  . pramipexole (MIRAPEX) 1 MG tablet Take 1 mg by mouth 2 (two) times daily.    . Semaglutide (OZEMPIC) 0.25 or 0.5 MG/DOSE SOPN Inject 0.5 mg into the skin every 30 (thirty) days.     Marland Kitchen torsemide (DEMADEX) 20 MG tablet Take 2 tablets (40 mg total) by mouth 2 (two) times daily. 60 tablet 1  . TRELEGY ELLIPTA 100-62.5-25 MCG/INH AEPB Inhale 1 puff into the lungs daily.  3  . TRULICITY 1.5 MH/9.6QI SOPN Inject 1.5 mg into the skin every Sunday.   4   No current facility-administered medications on file prior to visit.     ALLERGIES: Allergies  Allergen Reactions  . Hydroxychloroquine Shortness Of Breath, Nausea Only and Other (See Comments)    Dizziness (also)  . Donepezil Other (See Comments)    Dizziness,  depression, and makes the patient feel "funny"  . Prednisone Other (See Comments)    Causes depression and suicidal thoughts  . Bupropion Other (See Comments)    Suicidal thoughts  . Metrizamide Other (See Comments)    (a non-ionic radiopaque contrast agent) "Blows the vein" and contrast gathers at the injected site's limb  . Varenicline Other (See Comments)    Suicidal thoughts  . Tape Rash and Other (See Comments)    Paper tape is preferred, PLEASE    FAMILY HISTORY: Family History  Problem Relation Age of Onset  . Stroke Mother   . Diabetes Father   . Diabetes Sister   . Diabetes Sister   . Diabetes Son     SOCIAL HISTORY: Social History   Socioeconomic History  . Marital status: Married    Spouse name: Not on file  . Number of children: Not on file  . Years of education: Not on file  . Highest education level: Not on file  Occupational History  . Not on file  Social Needs  . Financial resource strain: Not on file  . Food insecurity:    Worry: Not on file    Inability: Not on file  . Transportation needs:    Medical: Not on file    Non-medical: Not on file  Tobacco Use  . Smoking status: Current Every Day Smoker    Packs/day: 0.75    Years: 44.00    Pack years: 33.00    Types: Cigarettes  . Smokeless tobacco: Never Used  Substance and Sexual Activity  . Alcohol use: Yes    Alcohol/week: 4.2 oz    Types: 7 Shots of liquor per week  . Drug use: Yes    Types: Marijuana    Comment: 10/23/2017 "had 1 ~ 1 month ago; nothing before or since"  . Sexual activity: Yes  Lifestyle  . Physical activity:    Days per week: Not on file    Minutes per session: Not on file  . Stress: Not on file  Relationships  . Social connections:    Talks on phone: Not on file    Gets together: Not on file    Attends religious service: Not on file    Active member of club or organization: Not on file    Attends meetings of clubs or organizations: Not on file    Relationship  status: Not on file  . Intimate partner violence:  Fear of current or ex partner: Not on file    Emotionally abused: Not on file    Physically abused: Not on file    Forced sexual activity: Not on file  Other Topics Concern  . Not on file  Social History Narrative  . Not on file    REVIEW OF SYSTEMS: Constitutional: No fevers, chills, or sweats, no generalized fatigue, change in appetite Eyes: No visual changes, double vision, eye pain Ear, nose and throat: No hearing loss, ear pain, nasal congestion, sore throat Cardiovascular: No chest pain, palpitations Respiratory:  No shortness of breath at rest or with exertion, wheezes GastrointestinaI: No nausea, vomiting, diarrhea, abdominal pain, fecal incontinence Genitourinary:  No dysuria, urinary retention or frequency Musculoskeletal:  No neck pain, back pain Integumentary: No rash, pruritus, skin lesions Neurological: as above Psychiatric: No depression, insomnia, anxiety Endocrine: No palpitations, fatigue, diaphoresis, mood swings, change in appetite, change in weight, increased thirst Hematologic/Lymphatic:  No purpura, petechiae. Allergic/Immunologic: no itchy/runny eyes, nasal congestion, recent allergic reactions, rashes  PHYSICAL EXAM: Vitals:   10/26/17 0732  BP: 110/70  Pulse: 67  SpO2: 95%   General: No acute distress.  Morbidly obese Head:  Normocephalic/atraumatic Eyes:  fundi examined but not visualized Neck: supple, no paraspinal tenderness, full range of motion Back: No paraspinal tenderness Heart: regular rate and rhythm Lungs: Clear to auscultation bilaterally. Vascular: No carotid bruits. Neurological Exam: Mental status: alert and oriented to person, place, and time, recent and remote memory intact, fund of knowledge intact, attention and concentration intact, speech fluent and not dysarthric, language intact. Cranial nerves: CN I: not tested CN II: pupils equal, round and reactive to light, visual  fields intact CN III, IV, VI:  full range of motion, no nystagmus, no ptosis CN V: facial sensation intact CN VII: upper and lower face symmetric CN VIII: hearing intact CN IX, X: gag intact, uvula midline CN XI: sternocleidomastoid and trapezius muscles intact CN XII: tongue midline Bulk & Tone: normal, no fasciculations. Motor:  5/5 throughout  Sensation:  Pinprick and vibration sensation intact. Deep Tendon Reflexes:  Slightly brisk throughout, toes downgoing.  Finger to nose testing:  Without dysmetria.  Heel to shin:  Without dysmetria.  Gait:  Wide-based.  . Romberg positive.  IMPRESSION: Migraine without aura, without status migrainosus, not intractable Dizziness, likely orthostasis Unsteady gait, multifactorial.  Possibly related to girth, dizziness.  Diabetic neuropathy possible but does not seem prominent on exam.  If she had cervical spinal stenosis, that may be contributing.  I would like to obtain cervical MRIs prior to surgery for review. Tobacco use disorder Morbid obesity  PLAN: 1.  Continue Emgality 2.  Treat migraines with tylenol if needed, limit use of pain relievers to no more than 2 days out of week to prevent rebound headache 3.  Tobacco cessation counseling (CPT 99406):  Tobacco use with history of CAD, stroke, or cancer  - Currently smoking less than 1 pack/day   - Patient was informed of the dangers of tobacco abuse including stroke, cancer, and MI, as well as benefits of tobacco cessation. - Patient is not willing to quit at this time. - Approximately 4 mins were spent counseling patient cessation techniques. We discussed various methods to help quit smoking, including deciding on a date to quit, joining a support group, pharmacological agents- nicotine gum/patch/lozenges, chantix.  - I will reassess her progress if follow up is needed.  4.  Weight loss 5.  Follow up if headaches get worse.  Thank you for allowing me to take part in the care of this  patient.  Metta Clines, DO  CC: Dortha Kern, PA

## 2017-10-26 NOTE — Progress Notes (Signed)
OT Note - Addendum    10/26/17 1500  OT Visit Information  Last OT Received On 10/26/17  OT Time Calculation  OT Start Time (ACUTE ONLY) 1358  OT Stop Time (ACUTE ONLY) 1424  OT Time Calculation (min) 26 min  OT General Charges  $OT Visit 1 Visit  OT Evaluation  $OT Eval Low Complexity 1 Low  OT Treatments  $Self Care/Home Management  8-22 mins  Maurie Boettcher, OT/L  OT Clinical Specialist [phone]

## 2017-10-26 NOTE — H&P (Signed)
Date: 10/26/2017               Patient Name:  Crystal Yates MRN: [id number]  DOB: 08-20-1955 Age / Sex: 62 y.o., female   PCP: Dortha Kern, PA         Medical Service: Internal Medicine Teaching Service         Attending Physician: Dr. Beryle Beams Alyson Locket, MD    First Contact: Dr. Donne Hazel Pager: [phone]  Second Contact: Dr. Hetty Ely Pager: [phone]       After Hours (After 5p/  First Contact Pager: [phone]  weekends / holidays): Second Contact Pager: [phone]   Chief Complaint: shortness of breath  History of Present Illness:  T2DM, HTN, ICM with h/o combined CHF (last echo 09/2017 showing EF 60-65%, G1DD), h/o CAD s/p stenting of dRCA and balloon angioplasty of re-stenosis 09/2017 presenting to the ED after brief discharge to make a long awaited neurology appointment. Patient was admitted 10/23/17 for CHF exacerbation and has been making progress but is not at dry weight.   Patient states that she still has wheezing, orthopnea, and dyspnea worse on exertion. She continues to have nonproductive cough. She does feel that she has had good urine output with the ongoing diuresis. Her husband notes that she was walking in the hallways yesterday and seemed to tolerate it better than prior to admission. Patient agrees with this and adds that her right sided groin pain is only a small part of what keeps her from exerting herself as much and that the dyspnea is the more prevalent part. She endorses instability with walking and mild dizziness which has been ongoing even prior to her LHC and current issues.   She continues to have right lower quadrant and groin pain from site of hematoma. It is slowly improving. She denies fevers, chills, nausea, vomiting, dysuria, hematuria, hematochezia, melena.  Meds:  Current Meds  Medication Sig  . acetaminophen (TYLENOL) 325 MG tablet Take 2 tablets (650 mg total) by mouth every 4 (four) hours as needed for headache or mild pain.  Marland Kitchen aspirin 81 MG  chewable tablet Chew 1 tablet (81 mg total) by mouth daily.  Marland Kitchen atorvastatin (LIPITOR) 80 MG tablet Take 1 tablet (80 mg total) by mouth daily at 6 PM.  . CALCIUM PO Take 1 tablet by mouth daily.  . Cholecalciferol (VITAMIN D3) 2000 units capsule Take 2,000 Units by mouth daily.   . clopidogrel (PLAVIX) 75 MG tablet Take 75 mg by mouth daily.  . DULoxetine (CYMBALTA) 60 MG capsule Take 60 mg by mouth once a day  . EMGALITY 120 MG/ML SOAJ Inject 120 mg into the skin every 30 (thirty) days.   . fludrocortisone (FLORINEF) 0.1 MG tablet Take 1 tablet (0.1 mg total) by mouth 2 (two) times daily.  . hydrocortisone (CORTEF) 5 MG tablet Take 5-15 mg by mouth See admin instructions. Take 15 mg by mouth in the morning and 5 mg in the afternoon  . Insulin Aspart, w/Niacinamide, (FIASP FLEXTOUCH) 100 UNIT/ML SOPN Inject 2-14 Units into the skin See admin instructions. Use three times day as needed before meals per sliding scale  . insulin degludec (TRESIBA FLEXTOUCH) 100 UNIT/ML SOPN FlexTouch Pen Inject 40 Units into the skin at bedtime.   . Iron-FA-B Cmp-C-Biot-Probiotic (FUSION PLUS) CAPS Take 1 capsule by mouth daily.  Marland Kitchen levothyroxine (SYNTHROID, LEVOTHROID) 150 MCG tablet Take 150 mcg by mouth daily before breakfast.  . Magnesium 250 MG TABS Take 250 mg by mouth  daily.  . oxybutynin (DITROPAN XL) 15 MG 24 hr tablet Take 15 mg by mouth at bedtime.  . Oxycodone HCl 10 MG TABS Take 0.5-1 tablets (5-10 mg total) by mouth every 6 (six) hours as needed. (Patient taking differently: Take 5-10 mg by mouth every 6 (six) hours as needed (pain). )  . potassium chloride SA (K-DUR,KLOR-CON) 20 MEQ tablet Take 1 tablet (20 mEq total) by mouth daily.  . pramipexole (MIRAPEX) 1 MG tablet Take 1 mg by mouth 2 (two) times daily.  . Semaglutide (OZEMPIC) 0.25 or 0.5 MG/DOSE SOPN Inject 0.5 mg into the skin every 30 (thirty) days.   Marland Kitchen torsemide (DEMADEX) 20 MG tablet Take 2 tablets (40 mg total) by mouth 2 (two) times daily.   . TRELEGY ELLIPTA 100-62.5-25 MCG/INH AEPB Inhale 1 puff into the lungs daily.  . TRULICITY 1.5 ZO/1.0RU SOPN Inject 1.5 mg into the skin every Sunday.    Allergies: Allergies as of 10/26/2017 - Review Complete 10/26/2017  Allergen Reaction Noted  . Hydroxychloroquine Shortness Of Breath, Nausea Only, and Other (See Comments) 11/21/2013  . Donepezil Other (See Comments) 10/16/2013  . Prednisone Other (See Comments) 09/02/2013  . Bupropion Other (See Comments) 07/15/2016  . Metrizamide Other (See Comments) 10/05/2016  . Varenicline Other (See Comments) 07/15/2016  . Tape Rash and Other (See Comments) 10/12/2016   Past Medical History:  Diagnosis Date  . Acute on chronic systolic heart failure exacerbation(HCC) 04/08/2016  . CAD in native artery    a. Prior LAD stenting based on cath. b. RCA stenting 03/2016 x2.  . Chronic combined systolic and diastolic CHF (congestive heart failure) ([place])   . CKD (chronic kidney disease), stage II   . COPD (chronic obstructive pulmonary disease) ([place])   . Diabetes mellitus without complication ([place])   . Hashimoto's thyroiditis   . Hyperlipidemia   . Hypertension   . Myocardial infarction ([place])   . On home oxygen therapy    "2L; 24/7" (10/23/2017)  . Secondary adrenal insufficiency ([place])   . Thyroid disease   . Tobacco abuse     Family History: Mother - CVA; Father - DM  Social History: Married, used to Probation officer; current smoker 3-4 packs per day for 5yrs; 1-2oz of liquor daily; denies illicit drug use.  Review of Systems: A complete ROS was negative except as per HPI.   Physical Exam: Blood pressure 127/77, pulse 69, temperature 98.1 F (36.7 C), resp. rate 18, SpO2 100 %. GENERAL- alert, co-operative, appears as stated age, not in any distress. HEENT- EOMI, oral mucosa appears moist CARDIAC- Diminished heart sounds, RRR, no murmurs, rubs or gallops. RESP- Moving equal volumes of air, mild right sided rales,  diffuse coarse breath sounds; no wheezing, no increased work of breathing. ABDOMEN- Soft, nondistended; stable tenderness to RLQ at site of hematoma NEURO- No obvious Cr N abnormality. EXTREMITIES- pulse 1+ radial, PT, symmetric. 1+ pitting edema in bil LEs to knees. SKIN- Warm, dry. Excoriated lesions on face w/o erythema, induration or drainage. PSYCH- Normal mood and affect, appropriate thought content and speech.  EKG: Personally reviewed - SR, no ischemic changes including ST elevation, depression or TWI compared to previous  Assessment & Plan by Problem: Active Problems:   Acute on chronic systolic heart failure exacerbation(HCC)   Diabetes mellitus with no complication (HCC)   Dyspnea on exertion   History of coronary artery disease   Hypertension   Hypokalemia   Oxygen dependent   Type 2 diabetes mellitus without complication,  with long-term current use of insulin (HCC)   Volume overload  Combined CHF exacerbation: Patient with combined CHF (last echo 09/2017 showing EF 60-65%, G1DD). Prior to brief discharge for neurology appointment, she was undergoing diuresis with goal weight of 213. She has been diuresing with lasix 120mg  IV BID. Weight this morning was 219lbs. She continues to appear volume overloaded on exam and is symptomatic with dyspnea worse on exertion and orthopnea. She endorses wheezing which is only minimally improved with duonebs and which she attributes to volume. On previous admissions, her imdur, entresto and coreg were held due to low-normal BPs and ongoing diuresis Bmet this morning shows stable renal function with Cr of 1.14 (this is baseline for her) and K of 3.3. --IV lasix 120mg  BID --continue holding coreg, imdur, entresto; if able this admission we can start titrating these back, but if unable to she can follow up with her cardiologist and PCP for re-initiation once able --strict ins/outs --daily weights --HH/CM/fluid restricted diet to 1835ml --PT/OT  consult due to prolonged overall number of days hospitalized in the last couple of weeks and deconditioning --f/u AM bmet  Hypokalemia Potassium of 3.1 this morning for which she was given 2 doses of Kdur 43meq prior to leaving the hospital. Mag today is 1.9 (received 2g early this AM).  --Kdur 12mEq q4hr x2 --f/u AM Bmet and Mag  Secondary adrenal insufficiency: Home regimen of hydrocortisone 15mg  qAM and 5mg  qPM, along with fludrocortisone 0.1mg  BID. --continue home regimen  RLQ hematoma: Related to recent left heart cath complicated by RFA pseudoaneurysm s/p successful reduction. Vascular surgery had evaluated and followed along with no further recommendations. It would be more risk of infection and complications to attempt evacuation so they recommend conservative management. --ice to area --hydrocodone-apap q6hr prn  T2DM: Patient on Tresiba 40u qhs, aspart w/niacinamide sliding scale with meals, semaglutide at home. This AM her fasting CBG was 193 on previous regimen of Lantus 13 and SSI wc. She has been slowly adjusted on her inpatient insulin dosing due to initial hypoglycemic presentation. We have reduced in steroid dosing which will help in better glycemic control. --increase lantus 16 units qhs (20% increase) --SSI-M wc  CAD HTN: Stable, no chest pain today. --continue asa and plavix, atorvastatin --hold entresto, imdur, coreg  Hypothyroidism: Patient continued on home levothyroxine 181mcg daily  COPD on chronic 2L [place]: Does not appear to be in acute exacerbation. --continue triple therapy based on formulary (home Trelegy) --duonebs q6hr prn  IVF: none DVT ppx: lovenox Code: Full  Dispo: Admit patient to Inpatient with expected length of stay greater than 2 midnights.  Signed: Alphonzo Grieve, MD 10/26/2017, 12:01 PM  [facility] - PGY3 Pager [phone]

## 2017-10-27 ENCOUNTER — Inpatient Hospital Stay (HOSPITAL_COMMUNITY): Payer: Federal, State, Local not specified - PPO

## 2017-10-27 DIAGNOSIS — E039 Hypothyroidism, unspecified: Secondary | ICD-10-CM

## 2017-10-27 LAB — CBC
HEMATOCRIT: 31.7 % — AB (ref 36.0–46.0)
Hemoglobin: 9.7 g/dL — ABNORMAL LOW (ref 12.0–15.0)
MCH: 30.2 pg (ref 26.0–34.0)
MCHC: 30.6 g/dL (ref 30.0–36.0)
MCV: 98.8 fL (ref 78.0–100.0)
PLATELETS: 323 10*3/uL (ref 150–400)
RBC: 3.21 MIL/uL — AB (ref 3.87–5.11)
RDW: 15.4 % (ref 11.5–15.5)
WBC: 10.9 10*3/uL — ABNORMAL HIGH (ref 4.0–10.5)

## 2017-10-27 LAB — BRAIN NATRIURETIC PEPTIDE: B Natriuretic Peptide: 191.6 pg/mL — ABNORMAL HIGH (ref 0.0–100.0)

## 2017-10-27 LAB — BASIC METABOLIC PANEL
Anion gap: 8 (ref 5–15)
BUN: 23 mg/dL (ref 8–23)
CO2: 37 mmol/L — AB (ref 22–32)
CREATININE: 1.21 mg/dL — AB (ref 0.44–1.00)
Calcium: 9.6 mg/dL (ref 8.9–10.3)
Chloride: 96 mmol/L — ABNORMAL LOW (ref 98–111)
GFR calc non Af Amer: 47 mL/min — ABNORMAL LOW (ref >60)
GFR, EST AFRICAN AMERICAN: 54 mL/min — AB (ref >60)
Glucose, Bld: 143 mg/dL — ABNORMAL HIGH (ref 70–99)
POTASSIUM: 4.2 mmol/L (ref 3.5–5.1)
Sodium: 141 mmol/L (ref 135–145)

## 2017-10-27 LAB — GLUCOSE, CAPILLARY
GLUCOSE-CAPILLARY: 155 mg/dL — AB (ref 70–99)
GLUCOSE-CAPILLARY: 277 mg/dL — AB (ref 70–99)
Glucose-Capillary: 183 mg/dL — ABNORMAL HIGH (ref 70–99)

## 2017-10-27 LAB — MAGNESIUM: Magnesium: 2 mg/dL (ref 1.7–2.4)

## 2017-10-27 MED ORDER — UMECLIDINIUM BROMIDE 62.5 MCG/INH IN AEPB: 1.0000 | INHALATION_SPRAY | Freq: Every day | RESPIRATORY_TRACT | Status: DC

## 2017-10-27 MED ORDER — METOPROLOL SUCCINATE ER 25 MG PO TB24: 12.5000 mg | ORAL_TABLET | Freq: Every day | ORAL | 2 refills | Status: DC

## 2017-10-27 MED ORDER — METOPROLOL SUCCINATE ER 25 MG PO TB24
12.5000 mg | ORAL_TABLET | Freq: Every day | ORAL | Status: DC
  Administered 2017-10-27: 12.5 mg via ORAL
  Filled 2017-10-27: qty 1

## 2017-10-27 MED ORDER — FLUTICASONE FUROATE-VILANTEROL 100-25 MCG/INH IN AEPB: 1.0000 | INHALATION_SPRAY | Freq: Every day | RESPIRATORY_TRACT | Status: DC

## 2017-10-27 NOTE — Discharge Summary (Deleted)
  The note originally documented on this encounter has been moved the the encounter in which it belongs.  

## 2017-10-27 NOTE — Discharge Summary (Signed)
Name: Crystal Yates MRN: [id number] DOB: 1955-04-15 62 y.o. PCP: Dortha Kern, PA  Date of Admission: 10/23/17  Date of Discharge:  Attending Physician: Annia Belt, MD  Discharge Diagnosis: 1. Acute on Chronic systolic heart failure exacerbation   Discharge Medications: Allergies as of 10/27/2017      Reactions   Hydroxychloroquine Shortness Of Breath, Nausea Only, Other (See Comments)   Dizziness (also)   Donepezil Other (See Comments)   Dizziness, depression, and makes the patient feel "funny"   Prednisone Other (See Comments)   Causes depression and suicidal thoughts   Bupropion Other (See Comments)   Suicidal thoughts   Metrizamide Other (See Comments)   (a non-ionic radiopaque contrast agent) "Blows the vein" and contrast gathers at the injected site's limb   Varenicline Other (See Comments)   Suicidal thoughts   Tape Rash, Other (See Comments)   Paper tape is preferred, PLEASE      Medication List    TAKE these medications   acetaminophen 325 MG tablet Commonly known as:  TYLENOL Take 2 tablets (650 mg total) by mouth every 4 (four) hours as needed for headache or mild pain.   aspirin 81 MG chewable tablet Chew 1 tablet (81 mg total) by mouth daily.   atorvastatin 80 MG tablet Commonly known as:  LIPITOR Take 1 tablet (80 mg total) by mouth daily at 6 PM.   CALCIUM PO Take 1 tablet by mouth daily.   clopidogrel 75 MG tablet Commonly known as:  PLAVIX Take 75 mg by mouth daily.   DULoxetine 60 MG capsule Commonly known as:  CYMBALTA Take 60 mg by mouth once a day   EMGALITY 120 MG/ML Soaj Generic drug:  Galcanezumab-gnlm Inject 120 mg into the skin every 30 (thirty) days.   FIASP FLEXTOUCH 100 UNIT/ML Sopn Generic drug:  Insulin Aspart (w/Niacinamide) Inject 2-14 Units into the skin See admin instructions. Use three times day as needed before meals per sliding scale   fludrocortisone 0.1 MG tablet Commonly known as:   FLORINEF Take 1 tablet (0.1 mg total) by mouth 2 (two) times daily.   FUSION PLUS Caps Take 1 capsule by mouth daily.   hydrocortisone 5 MG tablet Commonly known as:  CORTEF Take 5-15 mg by mouth See admin instructions. Take 15 mg by mouth in the morning and 5 mg in the afternoon   levothyroxine 150 MCG tablet Commonly known as:  SYNTHROID, LEVOTHROID Take 150 mcg by mouth daily before breakfast.   Magnesium 250 MG Tabs Take 250 mg by mouth daily.   metoprolol succinate 25 MG 24 hr tablet Commonly known as:  TOPROL-XL Take 0.5 tablets (12.5 mg total) by mouth daily.   nitroGLYCERIN 0.4 MG SL tablet Commonly known as:  NITROSTAT Place 0.4 mg under the tongue every 5 (five) minutes as needed for chest pain.   oxybutynin 15 MG 24 hr tablet Commonly known as:  DITROPAN XL Take 15 mg by mouth at bedtime.   Oxycodone HCl 10 MG Tabs Take 0.5-1 tablets (5-10 mg total) by mouth every 6 (six) hours as needed. What changed:  reasons to take this   OZEMPIC 0.25 or 0.5 MG/DOSE Sopn Generic drug:  Semaglutide Inject 0.5 mg into the skin every 30 (thirty) days.   potassium chloride SA 20 MEQ tablet Commonly known as:  K-DUR,KLOR-CON Take 1 tablet (20 mEq total) by mouth daily.   pramipexole 1 MG tablet Commonly known as:  MIRAPEX Take 1 mg by mouth 2 (two)  times daily.   torsemide 20 MG tablet Commonly known as:  DEMADEX Take 2 tablets (40 mg total) by mouth 2 (two) times daily.   TRELEGY ELLIPTA 100-62.5-25 MCG/INH Aepb Generic drug:  Fluticasone-Umeclidin-Vilant Inhale 1 puff into the lungs daily.   TRESIBA FLEXTOUCH 100 UNIT/ML Sopn FlexTouch Pen Generic drug:  insulin degludec Inject 40 Units into the skin at bedtime.   TRULICITY 1.5 UV/2.5DG Sopn Generic drug:  Dulaglutide Inject 1.5 mg into the skin every Sunday.   Vitamin D3 2000 units capsule Take 2,000 Units by mouth daily.       Disposition and follow-up:   [name] was discharged from  [facility] in Good condition.  At the hospital follow up visit please address:  1.  Volume status and sob: Admission weight 231lbs. Dry weight on discharge was 219 lbs  CHF: Carvedilol held at the time of discharge from prior admission for hypotension with SBPs 110-120s. Due to the need for beta blockade she was started on metoprolol this admission with hopes that this would have less of a blood pressure lowering affect. Please evaluate for symptoms of hypotension/bradycardia and intolerance of this medication. If she is not experiencing intollerance please continue to titrate metop to achieve goal HR < 65.   2.  Labs / imaging needed at time of follow-up: BMP   3.  Pending labs/ test needing follow-up: none   Follow-up Appointments: Follow-up Information    Dortha Kern, [place]. Schedule an appointment as soon as possible for a visit in 1 week(s).   Specialty:  Physician Assistant Contact information: [address]  [address] [place] [address] [phone]        Park Liter, MD. Go on 11/03/2017.   Specialty:  Cardiology Why:  You have an appointment scheduled at 10:40am. Please dont hesitate to call and reschedule this appointment if it doesnt work for your schedule.  Contact information: [place] [address] [place] Hospital Course by problem list: 1. CHF Exacerbation: This was an extension of the original admission on 7/29, Crystal Yates was temporarily discharged the morning of 8/1 so that she could attend an outpatient physicians appointment then readmit later that afternoon. Last echo 09/2017 with EF 60-65%. Presented to the ED with 1 day history of progressive wheezing, dyspnea, orthopnea as well as dizziness, nausea and fatigue. Exam revealed weight of 231lbs, bibasilar rales and CXR suggestive of bil infiltrates. She was aggressively diuresed with IV Lasix 120 BID. Pulmonary symptoms improved, but lung exam with  worse rhonchi. CXR showed increased interstitial coarsening of lung bases which may reflect acute on chronic bronchitis. She remained on her home 2L [place] O2. Dry weight on discharge was 219 lbs. She was discharged on torsemide 40 mg BID which had been started during an admission earlier this month when she stated that PO lasix wasn't working for diuresis.   2. RLQ hematoma:Related to recent left heart cath complicated by RFA pseudoaneurysm s/p successful reduction. Vascular surgery had evaluated and followed along with no further recommendations. She had a small amount of oozing from LLQ after Lovenox injection. Given the size of the hematoma, I would not be surprised if it drains. This was managed conservatively and skin changes from hematoma slowly improved.   Chronic Conditions Stable During Admission:  Secondary adrenal insufficiency: Continue home regiment ofhydrocortisone 15mg  qAM and 5mg  qPM, along with fludrocortisone 0.1mg  BID.  T2DM: Home regiment  Tresiba 40u qhs, aspart w/ niacinamide sliding scale with meals, semaglutide at home. Held home regiment. Treated with Lantus 16 units qhs and SSI-M wc   CAD, HTN: stable, no chest pain. Continued aspirin, plavix, atorvastatin. Started metoprolol.  - held home entresto, imdur, and coreg given diuresis  Hypothyroidism: Continued home levothyroxine 122mcg daily  COPD on chronic 2L [place]: stable. Continued triple therapy based on formulary (home Trelegy) and duonebs q6h prn  Discharge Vitals:   BP 111/63   Pulse 70   Temp 98.1 F (36.7 C) (Oral)   Resp 18   Wt 219 lb (99.3 kg)   SpO2 100%   BMI 40.06 kg/m   Pertinent Labs, Studies, and Procedures:  Discharge Creatine 1.21 (baseline 1.1)  Discharge Instructions:  Discharge Instructions    (HEART FAILURE PATIENTS) Call MD:  Anytime you have any of the following symptoms: 1) 3 pound weight gain in 24 hours or 5 pounds in 1 week 2) shortness of breath, with or without a dry hacking  cough 3) swelling in the hands, feet or stomach 4) if you have to sleep on extra pillows at night in order to breathe.   Complete by:  As directed    Call MD for:  difficulty breathing, headache or visual disturbances   Complete by:  As directed    Call MD for:  persistant dizziness or light-headedness   Complete by:  As directed    Call MD for:  severe uncontrolled pain   Complete by:  As directed    Diet - low sodium heart healthy   Complete by:  As directed    Discharge instructions   Complete by:  As directed    Crystal Yates,  You were admitted to the hospital because you had too much fluid on your body, making it hard for you to breath. We call this a "congestive heart failure exacerbation." We aggressively diuresed you with IV Lasix and were able to get a lot of fluid off your lungs and the rest of your body. Please continue taking your home diuretic medicine and weigh yourself every day. If you gain three pounds in 24 hours or 5 pounds in one week call your cardiologist. Your goal dry weight is 213lbs. If you start increasing above this weight, let your primary care doctor know so he can adjust your diuretic medicine and hopefully prevent future hospitalizations for this same problem.   Your hematoma on your abdomen should continue to heal on its own as your body reabsorbs the blood. You will notice the skin turning yellow which is a good sign that your body is breaking down the blood and reabsorbing it. It is possible for you to have some oozing from the hematoma. If the oozing increases and won't slow down, seek medical attention.   If you experience worsening shortness of breath, cough, chest pain, or fevers, seek medical attention.   Heart Failure patients record your daily weight using the same scale at the same time of day   Complete by:  As directed    Increase activity slowly   Complete by:  As directed    STOP any activity that causes chest pain, shortness of breath,  dizziness, sweating, or exessive weakness   Complete by:  As directed      Crystal Yates,  You were admitted to the hospital because you had too much fluid on your body, making it hard for you to breath. We call this a "congestive heart failure exacerbation."  We aggressively diuresed you with IV Lasix and were able to get a lot of fluid off your lungs and the rest of your body. Please continue taking your home diuretic medicine and weigh yourself every day. Your goal dry weight is 213lbs. If you start increasing above this weight, let your primary care doctor know so he can adjust your diuretic medicine and hopefully prevent future hospitalizations for this same problem.   Your hematoma on your abdomen should continue to heal on its own as your body reabsorbs the blood. You will notice the skin turning yellow which is a good sign that your body is breaking down the blood and reabsorbing it. It is possible for you to have some oozing from the hematoma. If the oozing increases and won't slow down, seek medical attention.   If you experience worsening shortness of breath, cough, chest pain, or fevers, seek medical attention.   Signed: Ledell Noss, MD 10/27/2017, 4:39 PM   Pager: [phone]

## 2017-10-27 NOTE — Evaluation (Signed)
Physical Therapy Evaluation Patient Details Name: Crystal Yates MRN: [id number] DOB: 06-Aug-1955 Today's Date: 10/27/2017   History of Present Illness  Crystal Yates is a 62 y.o. female with CAD S/P PCI 1/18, HFrEF (40 %-1/19),CKD II, COPD( Home O2 2L),DM, HTN, secondary adrenal insufficiency, thyroid disease and a current active smoker (43 pack year) who presented with one week history of worsening dyspnea. Found to have acute respiratory distress  Clinical Impression  Pt was seen for evaluation of mobility after having SOB with CHF and respiratory distress.  Has 3L O2 on lengthened line, walked short trips in her room with supervised sats and note she is at satisfactory level at end of session.  Follow acutely for strength and balance until ready to DC home.  HHPT to follow up after that with husband to assist her.    Follow Up Recommendations Home health PT;Supervision for mobility/OOB    Equipment Recommendations  None recommended by PT    Recommendations for Other Services       Precautions / Restrictions Precautions Precautions: Fall Precaution Comments: telemetry Restrictions Weight Bearing Restrictions: No      Mobility  Bed Mobility Overal bed mobility: Modified Independent                Transfers Overall transfer level: Modified independent                  Ambulation/Gait Ambulation/Gait assistance: Min guard Gait Distance (Feet): 75 Feet Assistive device: Rolling walker (2 wheeled);1 person hand held assist Gait Pattern/deviations: Step-to pattern;Step-through pattern;Decreased stride length;Drifts right/left;Wide base of support Gait velocity: reduced Gait velocity interpretation: <1.31 ft/sec, indicative of household ambulator General Gait Details: pt is impulsive at times with walker, turning faster on last round of walk  Stairs            Wheelchair Mobility    Modified Rankin (Stroke Patients Only)       Balance  Overall balance assessment: Needs assistance Sitting-balance support: Feet supported Sitting balance-Leahy Scale: Good     Standing balance support: Bilateral upper extremity supported;During functional activity Standing balance-Leahy Scale: Fair Standing balance comment: vc's for impulsive turning and maneuvering esp where she wants to leave walker and go without                             Pertinent Vitals/Pain Pain Assessment: No/denies pain    Home Living Family/patient expects to be discharged to:: Private residence Living Arrangements: Spouse/significant other Available Help at Discharge: Family;Available PRN/intermittently Type of Home: House Home Access: Stairs to enter Entrance Stairs-Rails: Left Entrance Stairs-Number of Steps: 4 Home Layout: One level Home Equipment: Clinical cytogeneticist - 4 wheels;Grab bars - tub/shower;Grab bars - toilet;Cane - single point      Prior Function Level of Independence: Needs assistance   Gait / Transfers Assistance Needed: rolling walker at home, has broken power scooter  ADL's / Homemaking Assistance Needed: husband to assist her self care and house work        Hand Dominance   Dominant Hand: Left    Extremity/Trunk Assessment   Upper Extremity Assessment Upper Extremity Assessment: Overall WFL for tasks assessed    Lower Extremity Assessment Lower Extremity Assessment: Generalized weakness    Cervical / Trunk Assessment Cervical / Trunk Assessment: Normal  Communication   Communication: No difficulties  Cognition Arousal/Alertness: Awake/alert Behavior During Therapy: WFL for tasks assessed/performed Overall Cognitive Status: Within Functional Limits for tasks assessed  General Comments General comments (skin integrity, edema, etc.): Mild SOB with walks in her room and noted O2 sat 97% after finishing on 3L O2.      Exercises     Assessment/Plan     PT Assessment Patient needs continued PT services  PT Problem List Decreased strength;Decreased range of motion;Decreased activity tolerance;Decreased balance;Decreased mobility;Decreased coordination;Decreased knowledge of use of DME;Decreased safety awareness;Cardiopulmonary status limiting activity;Obesity       PT Treatment Interventions DME instruction;Gait training;Stair training;Functional mobility training;Therapeutic activities;Therapeutic exercise;Balance training;Neuromuscular re-education;Patient/family education    PT Goals (Current goals can be found in the Care Plan section)  Acute Rehab PT Goals Patient Stated Goal: to go home PT Goal Formulation: With patient Time For Goal Achievement: 11/03/17 Potential to Achieve Goals: Good    Frequency Min 3X/week   Barriers to discharge Inaccessible home environment;Decreased caregiver support home with husband and stairs to enter house    Co-evaluation               AM-PAC PT "6 Clicks" Daily Activity  Outcome Measure Difficulty turning over in bed (including adjusting bedclothes, sheets and blankets)?: A Little Difficulty moving from lying on back to sitting on the side of the bed? : A Little Difficulty sitting down on and standing up from a chair with arms (e.g., wheelchair, bedside commode, etc,.)?: A Little Help needed moving to and from a bed to chair (including a wheelchair)?: A Little Help needed walking in hospital room?: A Little Help needed climbing 3-5 steps with a railing? : A Lot 6 Click Score: 17    End of Session Equipment Utilized During Treatment: Gait belt;Oxygen Activity Tolerance: Patient limited by fatigue;Treatment limited secondary to medical complications (Comment) Patient left: in bed;with call bell/phone within reach;with bed alarm set Nurse Communication: Mobility status PT Visit Diagnosis: Unsteadiness on feet (R26.81);Muscle weakness (generalized) (M62.81);Other abnormalities of gait  and mobility (R26.89)    Time: 7017-7939 PT Time Calculation (min) (ACUTE ONLY): 20 min   Charges:   PT Evaluation $PT Eval Moderate Complexity: 1 Mod         Ramond Dial 10/27/2017, 5:13 PM   Mee Hives, PT MS Acute Rehab Dept. Number: [place]

## 2017-10-27 NOTE — Progress Notes (Signed)
Subjective: Crystal Yates is feeling well today. Her breathing continues to improve. She reports feeling almost back to her baseline. She was able to walk down the hallway and did feel sob but mostly because she was unable to ambulate with her O2. Denies cough, chest pain. She did report some bloody oozing from her lovenox injection site. By the time I examined her, the bleeding had stopped.   Objective:  Vital signs in last 24 hours: Vitals:   10/26/17 2056 10/27/17 0512 10/27/17 0728 10/27/17 1134  BP: (!) 116/56 (!) 117/58    Pulse: 67 77    Resp: 18 17    Temp: 97.9 F (36.6 C) 98.4 F (36.9 C)    TempSrc: Oral Oral    SpO2: 98% 100% 100%   Weight:  220 lb 14.4 oz (100.2 kg)  219 lb (99.3 kg)   Gen: Obese, pleasant, laying in bed, Well appearing, NAD CV: RRR, no murmurs, no JVD Pulm: Normal effort, course rhonchi in bilateral bases (worse on right) Abd: Soft, ND, normal BS. Diffuse hematoma on RLQ, extending towards LLQ. Hematoma is purple and yellow with mild tenderness to palpation. Bandage over lovenox injection site in LLQ, removed bandage and no discharge or bleeding was visualized. Ext: Warm, no edema, normal joints Skin:  No rashes   Assessment/Plan:  Active Problems:   Secondary adrenal insufficiency (HCC)   Acute on chronic systolic heart failure exacerbation(HCC)   Diabetes mellitus with no complication (HCC)   Dyspnea on exertion   History of coronary artery disease   Hypertension   Hypokalemia   Pulmonary emphysema (HCC)   Oxygen dependent   Type 2 diabetes mellitus without complication, with long-term current use of insulin (HCC)   Volume overload   Coronary stent restenosis due to progression of disease  Combined CHF exacerbation: Last echo 09/2017 with EF 60-65%. Undergoing diuresis with goal weight of 213. Most recent weight increased at 220lbs. We would like to acheive her goal dry weight of 213lbs before discharge. She endorses DOE improved from  prior. On exam, course rhonchi at bilateral bases worse from prior, no LEE, no JVD. Baseline crt 1.14.  - CXR for worsening lung exam - Lasix 120mg  IV BID - monitor electrolytes and renal function, tapering diuresis as needed - continue holding coreg, imdur, entresto; if able this admission we can start titrating these back, but if unable to she can follow up with her cardiologist and PCP for re-initiation once able - strict ins/outs - daily weights - HH/CM/fluid restricted diet to 189ml - PT/OT consult due to prolonged overall number of days hospitalized in the last couple of weeks and deconditioning  Secondary adrenal insufficiency: - Continue home regiment of hydrocortisone 15mg  qAM and 5mg  qPM, along with fludrocortisone 0.1mg  BID.  RLQ hematoma: Related to recent left heart cath complicated by RFA pseudoaneurysm s/p successful reduction. Vascular surgery had evaluated and followed along with no further recommendations. It would be more risk of infection and complications to attempt evacuation so they recommend conservative management. - ice to area - hydrocodone-apap q6hr prn  T2DM: Home regiment Tresiba 40u qhs, aspart w/ niacinamide sliding scale with meals, semaglutide at home.  - Holding home regiment - Lantus 16 units qhs - SSI-M wc   CAD, HTN: stable, no chest pain - continue aspirin, plavix, atorvastatin - hold entresto, imdur, coreg  Hypothyroidism: - continue home levothyroxine 165mcg daily  COPD on chronic 2L [place]: stable - continue triple therapy based on formulary (home Trelegy) -  duonebs q6h prn  IVF: none DVT ppx: lovenox Code: full   Dispo: Anticipated discharge in approximately 1 day(s).   Isabelle Course, MD 10/27/2017, 12:40 PM Pager: [phone]

## 2017-10-27 NOTE — Progress Notes (Signed)
Crystal Yates to be D/C'd to home per MD order.  Discussed with the patient and all questions fully answered.  VSS, Skin clean, dry and intact without evidence of skin break down, no evidence of skin tears noted. IV catheter discontinued intact. Site without signs and symptoms of complications. Dressing and pressure applied.  An After Visit Summary was printed and given to the patient. Patient prescription sent to pharamacy.  D/c education completed with patient/family including follow up instructions, medication list, d/c activities limitations if indicated, with other d/c instructions as indicated by MD - patient able to verbalize understanding, all questions fully answered.   Patient instructed to return to ED, call 911, or call MD for any changes in condition.   Patient escorted via [place], and D/C home via private auto.  Lynann Beaver 10/27/2017 6:47 PM

## 2017-10-27 NOTE — Discharge Instructions (Signed)
[name], Crystal Yates were admitted to the hospital because you had too much fluid on your body, making it hard for you to breath. We call this a "congestive heart failure exacerbation." We aggressively diuresed you with IV Lasix and were able to get a lot of fluid off your lungs and the rest of your body. Please continue taking your home diuretic medicine and weigh yourself every day. Your goal dry weight is 213lbs. If you start increasing above this weight, let your primary care doctor know so he can adjust your diuretic medicine and hopefully prevent future hospitalizations for this same problem.   Your hematoma on your abdomen should continue to heal on its own as your body reabsorbs the blood. You will notice the skin turning yellow which is a good sign that your body is breaking down the blood and reabsorbing it. It is possible for you to have some oozing from the hematoma. If the oozing increases and won't slow down, seek medical attention.   If you experience worsening shortness of breath, cough, chest pain, or fevers, seek medical attention.

## 2017-10-31 DIAGNOSIS — I724 Aneurysm of artery of lower extremity: Secondary | ICD-10-CM | POA: Diagnosis not present

## 2017-10-31 DIAGNOSIS — Z139 Encounter for screening, unspecified: Secondary | ICD-10-CM | POA: Diagnosis not present

## 2017-10-31 DIAGNOSIS — Z9189 Other specified personal risk factors, not elsewhere classified: Secondary | ICD-10-CM | POA: Diagnosis not present

## 2017-10-31 DIAGNOSIS — I110 Hypertensive heart disease with heart failure: Secondary | ICD-10-CM | POA: Diagnosis not present

## 2017-11-02 DIAGNOSIS — R221 Localized swelling, mass and lump, neck: Secondary | ICD-10-CM | POA: Diagnosis not present

## 2017-11-03 ENCOUNTER — Ambulatory Visit (INDEPENDENT_AMBULATORY_CARE_PROVIDER_SITE_OTHER): Payer: Federal, State, Local not specified - PPO | Admitting: Cardiology

## 2017-11-03 ENCOUNTER — Encounter: Payer: Self-pay | Admitting: Cardiology

## 2017-11-03 VITALS — BP 120/70 | HR 95 | Ht 62.0 in | Wt 216.0 lb

## 2017-11-03 DIAGNOSIS — I25119 Atherosclerotic heart disease of native coronary artery with unspecified angina pectoris: Secondary | ICD-10-CM

## 2017-11-03 DIAGNOSIS — I5042 Chronic combined systolic (congestive) and diastolic (congestive) heart failure: Secondary | ICD-10-CM

## 2017-11-03 DIAGNOSIS — I10 Essential (primary) hypertension: Secondary | ICD-10-CM | POA: Diagnosis not present

## 2017-11-03 DIAGNOSIS — I9789 Other postprocedural complications and disorders of the circulatory system, not elsewhere classified: Secondary | ICD-10-CM | POA: Diagnosis not present

## 2017-11-03 DIAGNOSIS — I729 Aneurysm of unspecified site: Secondary | ICD-10-CM

## 2017-11-03 DIAGNOSIS — T81718A Complication of other artery following a procedure, not elsewhere classified, initial encounter: Secondary | ICD-10-CM

## 2017-11-03 NOTE — Patient Instructions (Signed)
Medication Instructions:  Your physician recommends that you continue on your current medications as directed. Please refer to the Current Medication list given to you today.   Labwork: None  Testing/Procedures: None  Follow-Up: Your physician recommends that you schedule a follow-up appointment in: 6 weeks   Any Other Special Instructions Will Be Listed Below (If Applicable).     If you need a refill on your cardiac medications before your next appointment, please call your pharmacy.   

## 2017-11-03 NOTE — Progress Notes (Signed)
Cardiology Office Note:    Date:  11/03/2017   ID:  Crystal Yates, Crystal Yates May 10, 1955, MRN [id number]  PCP:  Dortha Kern, [place]  Cardiologist:  Jenne Campus, MD    Referring MD: Dortha Kern, [place]   Chief Complaint  Patient presents with  . Hospitalization Follow-up  Doing much better  History of Present Illness:    Crystal Yates is a 62 y.o. female with multiple medical problems including coronary artery disease.  She required hospitalization for some atypical chest pain as well as shortness of breath cardiac catheterization was done she ended up having plain balloon angioplasty seems to be doing better after that sadly her procedure was complicated by pseudoaneurysm on the right groin that was successfully compressed.  She is getting better she is actually in very good mood and spirits today she chokes talks to me.  Past Medical History:  Diagnosis Date  . Acute on chronic systolic heart failure exacerbation(HCC) 04/08/2016  . CAD in native artery    a. Prior LAD stenting based on cath. b. RCA stenting 03/2016 x2.  . Chronic combined systolic and diastolic CHF (congestive heart failure) ([place])   . CKD (chronic kidney disease), stage II   . COPD (chronic obstructive pulmonary disease) ([place])   . Diabetes mellitus without complication ([place])   . Dyspnea   . Hashimoto's thyroiditis   . Hyperlipidemia   . Hypertension   . Myocardial infarction ([place])   . On home oxygen therapy    "2L; 24/7" (10/23/2017)  . Secondary adrenal insufficiency ([place])   . Thyroid disease   . Tobacco abuse     Past Surgical History:  Procedure Laterality Date  . ABDOMINAL SURGERY    . CESAREAN SECTION    . CHOLECYSTECTOMY    . COLON RESECTION    . CORONARY BALLOON ANGIOPLASTY N/A 10/13/2017   Procedure: CORONARY BALLOON ANGIOPLASTY;  Surgeon: Belva Crome, MD;  Location: [facility];  Service: Cardiovascular;  Laterality: N/A;  . HERNIA MESH REMOVAL    . HERNIA REPAIR    .  LEFT HEART CATH AND CORONARY ANGIOGRAPHY N/A 10/13/2016   Procedure: Left Heart Cath and Coronary Angiography;  Surgeon: Nelva Bush, MD;  Location: [facility];  Service: Cardiovascular;  Laterality: N/A;  . RIGHT/LEFT HEART CATH AND CORONARY ANGIOGRAPHY N/A 10/13/2017   Procedure: RIGHT/LEFT HEART CATH AND CORONARY ANGIOGRAPHY;  Surgeon: Belva Crome, MD;  Location: [facility];  Service: Cardiovascular;  Laterality: N/A;  . SHOULDER ARTHROSCOPY    . TUBAL LIGATION      Current Medications: Current Meds  Medication Sig  . acetaminophen (TYLENOL) 325 MG tablet Take 2 tablets (650 mg total) by mouth every 4 (four) hours as needed for headache or mild pain.  Marland Kitchen albuterol (PROAIR HFA) 108 (90 Base) MCG/ACT inhaler INL 2 PFS PO Q 4 TO 6 H PRN  . aspirin 81 MG chewable tablet Chew 1 tablet (81 mg total) by mouth daily.  Marland Kitchen atorvastatin (LIPITOR) 80 MG tablet Take 1 tablet (80 mg total) by mouth daily at 6 PM.  . CALCIUM PO Take 1 tablet by mouth daily.  . Cholecalciferol (VITAMIN D3) 2000 units capsule Take 2,000 Units by mouth daily.   . clopidogrel (PLAVIX) 75 MG tablet Take 75 mg by mouth daily.  . DULoxetine (CYMBALTA) 60 MG capsule Take 60 mg by mouth once a day  . EMGALITY 120 MG/ML SOAJ Inject 120 mg into the skin every 30 (thirty) days.   Marland Kitchen  fludrocortisone (FLORINEF) 0.1 MG tablet Take 1 tablet (0.1 mg total) by mouth 2 (two) times daily.  . hydrocortisone (CORTEF) 5 MG tablet Take 5-15 mg by mouth See admin instructions. Take 15 mg by mouth in the morning and 5 mg in the afternoon  . Insulin Aspart, w/Niacinamide, (FIASP FLEXTOUCH) 100 UNIT/ML SOPN Inject 2-14 Units into the skin See admin instructions. Use three times day as needed before meals per sliding scale  . insulin degludec (TRESIBA FLEXTOUCH) 100 UNIT/ML SOPN FlexTouch Pen Inject 40 Units into the skin at bedtime.   . Iron-FA-B Cmp-C-Biot-Probiotic (FUSION PLUS) CAPS Take 1 capsule by mouth daily.  Marland Kitchen  levothyroxine (SYNTHROID, LEVOTHROID) 150 MCG tablet Take 150 mcg by mouth daily before breakfast.  . Magnesium 250 MG TABS Take 250 mg by mouth daily.  . metoprolol succinate (TOPROL-XL) 25 MG 24 hr tablet Take 0.5 tablets (12.5 mg total) by mouth daily.  . nitroGLYCERIN (NITROSTAT) 0.4 MG SL tablet Place 0.4 mg under the tongue every 5 (five) minutes as needed for chest pain.   Marland Kitchen oxybutynin (DITROPAN XL) 15 MG 24 hr tablet Take 15 mg by mouth at bedtime.  . potassium chloride SA (K-DUR,KLOR-CON) 20 MEQ tablet Take 1 tablet (20 mEq total) by mouth daily.  . pramipexole (MIRAPEX) 1 MG tablet Take 1 mg by mouth 2 (two) times daily.  . Semaglutide (OZEMPIC) 0.25 or 0.5 MG/DOSE SOPN Inject 0.5 mg into the skin every 30 (thirty) days.   Marland Kitchen torsemide (DEMADEX) 20 MG tablet Take 2 tablets (40 mg total) by mouth 2 (two) times daily.  . TRELEGY ELLIPTA 100-62.5-25 MCG/INH AEPB Inhale 1 puff into the lungs daily.  . TRULICITY 1.5 ZW/2.5EN SOPN Inject 1.5 mg into the skin every Sunday.      Allergies:   Hydroxychloroquine; Donepezil; Prednisone; Anticoagulant cit dext [acd formula a]; Bupropion; Metrizamide; Varenicline; and Tape   Social History   Socioeconomic History  . Marital status: Married    Spouse name: Not on file  . Number of children: Not on file  . Years of education: Not on file  . Highest education level: Not on file  Occupational History  . Not on file  Social Needs  . Financial resource strain: Not on file  . Food insecurity:    Worry: Not on file    Inability: Not on file  . Transportation needs:    Medical: Not on file    Non-medical: Not on file  Tobacco Use  . Smoking status: Current Every Day Smoker    Packs/day: 0.75    Years: 44.00    Pack years: 33.00    Types: Cigarettes  . Smokeless tobacco: Never Used  Substance and Sexual Activity  . Alcohol use: Yes    Alcohol/week: 7.0 standard drinks    Types: 7 Shots of liquor per week  . Drug use: Yes    Types:  Marijuana    Comment: 10/23/2017 "had 1 ~ 1 month ago; nothing before or since"  . Sexual activity: Yes  Lifestyle  . Physical activity:    Days per week: Not on file    Minutes per session: Not on file  . Stress: Not on file  Relationships  . Social connections:    Talks on phone: Not on file    Gets together: Not on file    Attends religious service: Not on file    Active member of club or organization: Not on file    Attends meetings of clubs or organizations:  Not on file    Relationship status: Not on file  Other Topics Concern  . Not on file  Social History Narrative  . Not on file     Family History: The patient's family history includes Diabetes in her father, sister, sister, and son; Stroke in her mother. ROS:   Please see the history of present illness.    All 14 point review of systems negative except as described per history of present illness  EKGs/Labs/Other Studies Reviewed:     Normal left main.  Proximal to mid LAD stent with diffuse 30% restenosis.  Proximal circumflex with an eccentric 50 to 65% stenosis depending upon review..  Progression at this site is noted when compared to one year ago when this region contained 30% narrowing.  FFR was not possible due to COPD and intolerance of adenosine.  Dominant right coronary with proximal to distal stenting and overlap fashion, possibly with some region of stent within stent.  Distally there is an eccentric 70 to 75% stenosis that is clearly progressed when compared to one year ago when there was less than 50% narrowing.  Mild pulmonary hypertension with mean PA pressure of 30 mmHg.  Reduced LV systolic function with regional wall motion abnormality including severe hypokinesis of the inferobasal wall.  Estimated EF 40 to 45%.  LVEDP is 20 mmHg.  Mean pulmonary capillary wedge pressure is 20 mmHg.  Successful balloon angioplasty of distal right coronary ISR reducing from 75% to 0% with TIMI grade III flow using  a 3.5 mm [place] balloon x2 inflations.   Recent Labs: 07/07/2017: NT-Pro BNP 789 09/16/2017: TSH 2.218 10/24/2017: ALT 20 10/27/2017: B Natriuretic Peptide 191.6; BUN 23; Creatinine, Ser 1.21; Hemoglobin 9.7; Magnesium 2.0; Platelets 323; Potassium 4.2; Sodium 141  Recent Lipid Panel    Component Value Date/Time   CHOL 122 10/13/2016 0037   TRIG 68 10/13/2016 0037   HDL 34 (L) 10/13/2016 0037   CHOLHDL 3.6 10/13/2016 0037   VLDL 14 10/13/2016 0037   LDLCALC 74 10/13/2016 0037    Physical Exam:    VS:  BP 120/70 (BP Location: Right Arm, Patient Position: Sitting, Cuff Size: Normal)   Pulse 95   Ht 5\' 2"  (1.575 m)   Wt 216 lb (98 kg)   SpO2 95%   BMI 39.51 kg/m     Wt Readings from Last 3 Encounters:  11/03/17 216 lb (98 kg)  10/27/17 219 lb (99.3 kg)  10/26/17 219 lb (99.3 kg)     GEN:  Well nourished, well developed in no acute distress HEENT: Normal NECK: No JVD; No carotid bruits LYMPHATICS: No lymphadenopathy CARDIAC: RRR, no murmurs, no rubs, no gallops RESPIRATORY:  Clear to auscultation without rales, wheezing or rhonchi  ABDOMEN: Soft, non-tender, non-distended MUSCULOSKELETAL:  No edema; No deformity  SKIN: Warm and dry LOWER EXTREMITIES: no swelling NEUROLOGIC:  Alert and oriented x 3 PSYCHIATRIC:  Normal affect   ASSESSMENT:    1. Coronary artery disease involving native coronary artery of native heart with angina pectoris ([place])   2. Chronic combined systolic and diastolic CHF (congestive heart failure) ([place])   3. Essential hypertension   4. Pseudoaneurysm following procedure ([place])    PLAN:    In order of problems listed above:  1. Coronary artery disease status post plain balloon angioplasty of the distal right coronary artery.  Doing better after that she is on dual antiplatelet agents which will continue indefinitely. 2. Congestive heart rate compensated weight stable doing well from  that point review 3. Status post pseudoaneurysm compression.  Doing  better look at the groin difficult to palpate pulse but overall neck looks good and she is feeling better.  Distal pulses are weak but present. 4. Essential hypertension blood pressure appears to be well controlled we will continue present management.  Overall she is doing well continue present management I see her back in about a month   Medication Adjustments/Labs and Tests Ordered: Current medicines are reviewed at length with the patient today.  Concerns regarding medicines are outlined above.  No orders of the defined types were placed in this encounter.  Medication changes: No orders of the defined types were placed in this encounter.   Signed, Park Liter, MD, [facility] 11/03/2017 12:28 PM    [facility]

## 2017-11-07 ENCOUNTER — Ambulatory Visit: Payer: Federal, State, Local not specified - PPO | Admitting: Cardiology

## 2017-11-09 DIAGNOSIS — Z139 Encounter for screening, unspecified: Secondary | ICD-10-CM | POA: Diagnosis not present

## 2017-11-09 DIAGNOSIS — I110 Hypertensive heart disease with heart failure: Secondary | ICD-10-CM | POA: Diagnosis not present

## 2017-11-09 DIAGNOSIS — E1151 Type 2 diabetes mellitus with diabetic peripheral angiopathy without gangrene: Secondary | ICD-10-CM | POA: Diagnosis not present

## 2017-11-09 DIAGNOSIS — Z6841 Body Mass Index (BMI) 40.0 and over, adult: Secondary | ICD-10-CM | POA: Diagnosis not present

## 2017-11-14 DIAGNOSIS — J310 Chronic rhinitis: Secondary | ICD-10-CM | POA: Diagnosis not present

## 2017-11-14 DIAGNOSIS — G4733 Obstructive sleep apnea (adult) (pediatric): Secondary | ICD-10-CM | POA: Diagnosis not present

## 2017-11-14 DIAGNOSIS — R911 Solitary pulmonary nodule: Secondary | ICD-10-CM | POA: Diagnosis not present

## 2017-11-14 DIAGNOSIS — J449 Chronic obstructive pulmonary disease, unspecified: Secondary | ICD-10-CM | POA: Diagnosis not present

## 2017-11-14 NOTE — Discharge Summary (Signed)
Name: Crystal Yates MRN: [id number] DOB: 1955/09/22 62 y.o. PCP: Dortha Kern, PA  Date of Admission: 10/26/2017  9:06 AM Date of Discharge: 10/27/2017 Attending Physician: No att. providers found  Discharge Diagnosis: 1. Acute on chronic systolic heart failure exacerbation   Discharge Medications: Allergies as of 10/27/2017      Reactions   Hydroxychloroquine Shortness Of Breath, Nausea Only, Other (See Comments)   Dizziness (also)   Donepezil Other (See Comments)   Dizziness, depression, and makes the patient feel "funny"   Prednisone Other (See Comments)   Causes depression and suicidal thoughts   Bupropion Other (See Comments)   Suicidal thoughts   Metrizamide Other (See Comments)   (a non-ionic radiopaque contrast agent) "Blows the vein" and contrast gathers at the injected site's limb   Varenicline Other (See Comments)   Suicidal thoughts   Tape Rash, Other (See Comments)   Paper tape is preferred, PLEASE      Medication List    TAKE these medications   acetaminophen 325 MG tablet Commonly known as:  TYLENOL Take 2 tablets (650 mg total) by mouth every 4 (four) hours as needed for headache or mild pain.   aspirin 81 MG chewable tablet Chew 1 tablet (81 mg total) by mouth daily.   atorvastatin 80 MG tablet Commonly known as:  LIPITOR Take 1 tablet (80 mg total) by mouth daily at 6 PM.   CALCIUM PO Take 1 tablet by mouth daily.   clopidogrel 75 MG tablet Commonly known as:  PLAVIX Take 75 mg by mouth daily.   DULoxetine 60 MG capsule Commonly known as:  CYMBALTA Take 60 mg by mouth once a day   EMGALITY 120 MG/ML Soaj Generic drug:  Galcanezumab-gnlm Inject 120 mg into the skin every 30 (thirty) days.   FIASP FLEXTOUCH 100 UNIT/ML Sopn Generic drug:  Insulin Aspart (w/Niacinamide) Inject 2-14 Units into the skin See admin instructions. Use three times day as needed before meals per sliding scale   fludrocortisone 0.1 MG tablet Commonly  known as:  FLORINEF Take 1 tablet (0.1 mg total) by mouth 2 (two) times daily.   FUSION PLUS Caps Take 1 capsule by mouth daily.   hydrocortisone 5 MG tablet Commonly known as:  CORTEF Take 5-15 mg by mouth See admin instructions. Take 15 mg by mouth in the morning and 5 mg in the afternoon   levothyroxine 150 MCG tablet Commonly known as:  SYNTHROID, LEVOTHROID Take 150 mcg by mouth daily before breakfast.   Magnesium 250 MG Tabs Take 250 mg by mouth daily.   metoprolol succinate 25 MG 24 hr tablet Commonly known as:  TOPROL-XL Take 0.5 tablets (12.5 mg total) by mouth daily.   nitroGLYCERIN 0.4 MG SL tablet Commonly known as:  NITROSTAT Place 0.4 mg under the tongue every 5 (five) minutes as needed for chest pain.   oxybutynin 15 MG 24 hr tablet Commonly known as:  DITROPAN XL Take 15 mg by mouth at bedtime.   OZEMPIC 0.25 or 0.5 MG/DOSE Sopn Generic drug:  Semaglutide Inject 0.5 mg into the skin every 30 (thirty) days.   potassium chloride SA 20 MEQ tablet Commonly known as:  K-DUR,KLOR-CON Take 1 tablet (20 mEq total) by mouth daily.   pramipexole 1 MG tablet Commonly known as:  MIRAPEX Take 1 mg by mouth 2 (two) times daily.   torsemide 20 MG tablet Commonly known as:  DEMADEX Take 2 tablets (40 mg total) by mouth 2 (two) times daily.  TRELEGY ELLIPTA 100-62.5-25 MCG/INH Aepb Generic drug:  Fluticasone-Umeclidin-Vilant Inhale 1 puff into the lungs daily.   TRESIBA FLEXTOUCH 100 UNIT/ML Sopn FlexTouch Pen Generic drug:  insulin degludec Inject 40 Units into the skin at bedtime.   TRULICITY 1.5 ZO/1.0RU Sopn Generic drug:  Dulaglutide Inject 1.5 mg into the skin every Sunday.   Vitamin D3 2000 units capsule Take 2,000 Units by mouth daily.       Disposition and follow-up:   [name] was discharged from [facility] in Good condition.  At the hospital follow up visit please address:  1.  Volume status and  shortness of breath: Admission weight 231lbs. Discharge dry weight 219lbs  CHF: Carvedilol held due to hypotension. Started metoprolol. If tolerating well, without symptomatic hypotension or bradycardia, please titrate dose to goal HR < 65.   2.  Labs / imaging needed at time of follow-up: BMP  3.  Pending labs/ test needing follow-up: none  Follow-up Appointments: Follow-up Information    Dortha Kern, [place]. Schedule an appointment as soon as possible for a visit in 1 week(s).   Specialty:  Physician Assistant Contact information: [address]  [address] [place] [address] [phone]        Park Liter, MD. Go on 11/03/2017.   Specialty:  Cardiology Why:  You have an appointment scheduled at 10:40am. Please dont hesitate to call and reschedule this appointment if it doesnt work for your schedule.  Contact information: [place] [id number] [place] [facility] Course by problem list: 1. CHF Exacerbation: This was an extension of the original admission on 7/29. Please see previous discharge summary for full details. Ms. Crystal Yates prevented with signs and symptoms of volume overload. She was treated with IV lasix. Dry weight on discharge was 219lbs. She remained on her home 2L [place] O2. She was discharged on torsemide 40mg  BID due to decreased effectiveness of po lasix.   2. RLQ hematoma: managed conservatively   Discharge Vitals:   BP 111/63   Pulse 70   Temp 98.1 F (36.7 C) (Oral)   Resp 18   Wt 99.3 kg   SpO2 100%   BMI 40.06 kg/m   Pertinent Labs, Studies, and Procedures:  Discharge creatine 1.21 (baseline 1.1)  Discharge Instructions: Discharge Instructions    (HEART FAILURE PATIENTS) Call MD:  Anytime you have any of the following symptoms: 1) 3 pound weight gain in 24 hours or 5 pounds in 1 week 2) shortness of breath, with or without a dry hacking cough 3) swelling in the hands, feet or stomach 4) if you have to sleep  on extra pillows at night in order to breathe.   Complete by:  As directed    Call MD for:  difficulty breathing, headache or visual disturbances   Complete by:  As directed    Call MD for:  persistant dizziness or light-headedness   Complete by:  As directed    Call MD for:  severe uncontrolled pain   Complete by:  As directed    Diet - low sodium heart healthy   Complete by:  As directed    Discharge instructions   Complete by:  As directed    [name],  You were admitted to the hospital because you had too much fluid on your body, making it hard for you to breath. We call this a "congestive heart failure exacerbation." We aggressively diuresed  you with IV Lasix and were able to get a lot of fluid off your lungs and the rest of your body. Please continue taking your home diuretic medicine and weigh yourself every day. If you gain three pounds in 24 hours or 5 pounds in one week call your cardiologist. Your goal dry weight is 213lbs. If you start increasing above this weight, let your primary care doctor know so he can adjust your diuretic medicine and hopefully prevent future hospitalizations for this same problem.   Your hematoma on your abdomen should continue to heal on its own as your body reabsorbs the blood. You will notice the skin turning yellow which is a good sign that your body is breaking down the blood and reabsorbing it. It is possible for you to have some oozing from the hematoma. If the oozing increases and won't slow down, seek medical attention.   If you experience worsening shortness of breath, cough, chest pain, or fevers, seek medical attention.   Heart Failure patients record your daily weight using the same scale at the same time of day   Complete by:  As directed    Increase activity slowly   Complete by:  As directed    STOP any activity that causes chest pain, shortness of breath, dizziness, sweating, or exessive weakness   Complete by:  As directed        Signed: Isabelle Course, MD 11/14/2017, 2:43 PM   Pager: [phone]

## 2017-11-16 DIAGNOSIS — J9610 Chronic respiratory failure, unspecified whether with hypoxia or hypercapnia: Secondary | ICD-10-CM | POA: Diagnosis not present

## 2017-11-16 DIAGNOSIS — R262 Difficulty in walking, not elsewhere classified: Secondary | ICD-10-CM | POA: Diagnosis not present

## 2017-11-16 DIAGNOSIS — I724 Aneurysm of artery of lower extremity: Secondary | ICD-10-CM | POA: Diagnosis not present

## 2017-11-16 DIAGNOSIS — J449 Chronic obstructive pulmonary disease, unspecified: Secondary | ICD-10-CM | POA: Diagnosis not present

## 2017-11-17 ENCOUNTER — Telehealth: Payer: Self-pay | Admitting: Cardiology

## 2017-11-17 DIAGNOSIS — T81718A Complication of other artery following a procedure, not elsewhere classified, initial encounter: Secondary | ICD-10-CM

## 2017-11-17 DIAGNOSIS — I5023 Acute on chronic systolic (congestive) heart failure: Secondary | ICD-10-CM

## 2017-11-17 DIAGNOSIS — I729 Aneurysm of unspecified site: Secondary | ICD-10-CM

## 2017-11-17 NOTE — Telephone Encounter (Signed)
Crystal Yates called to see if we could tell her who the surgeon was that operated on her on 10/17/17. She is experiencing numbness, tingling and electrical shock running down her leg and feels like she might need to follow up with them to see if this is normal.

## 2017-11-17 NOTE — Telephone Encounter (Signed)
Returned patient's call. Patient reports right leg numbness and tingling since July 25th after compression was completed to her femoral artery. Reports seeing Dr. Agustin Cree on 8/9 and being told the numbness should go away. Patient concerned since the numbness hasn't go away. Will consult with Dr.Krasowksi

## 2017-11-17 NOTE — Telephone Encounter (Signed)
Left message for patient to return call to inform of Dr. Wendy Poet recommendation

## 2017-11-20 ENCOUNTER — Emergency Department (HOSPITAL_COMMUNITY): Payer: Federal, State, Local not specified - PPO

## 2017-11-20 ENCOUNTER — Encounter (HOSPITAL_COMMUNITY): Payer: Self-pay | Admitting: Emergency Medicine

## 2017-11-20 ENCOUNTER — Emergency Department (HOSPITAL_COMMUNITY)
Admission: EM | Admit: 2017-11-20 | Discharge: 2017-11-21 | Disposition: A | Discharge status: Home / Self Care | Payer: Federal, State, Local not specified - PPO | Attending: Emergency Medicine | Admitting: Emergency Medicine

## 2017-11-20 DIAGNOSIS — E1140 Type 2 diabetes mellitus with diabetic neuropathy, unspecified: Secondary | ICD-10-CM | POA: Diagnosis not present

## 2017-11-20 DIAGNOSIS — Z7982 Long term (current) use of aspirin: Secondary | ICD-10-CM | POA: Diagnosis not present

## 2017-11-20 DIAGNOSIS — Z79899 Other long term (current) drug therapy: Secondary | ICD-10-CM | POA: Diagnosis not present

## 2017-11-20 DIAGNOSIS — F17210 Nicotine dependence, cigarettes, uncomplicated: Secondary | ICD-10-CM | POA: Insufficient documentation

## 2017-11-20 DIAGNOSIS — I5042 Chronic combined systolic (congestive) and diastolic (congestive) heart failure: Secondary | ICD-10-CM | POA: Diagnosis not present

## 2017-11-20 DIAGNOSIS — E1122 Type 2 diabetes mellitus with diabetic chronic kidney disease: Secondary | ICD-10-CM | POA: Diagnosis not present

## 2017-11-20 DIAGNOSIS — I2510 Atherosclerotic heart disease of native coronary artery without angina pectoris: Secondary | ICD-10-CM | POA: Insufficient documentation

## 2017-11-20 DIAGNOSIS — I130 Hypertensive heart and chronic kidney disease with heart failure and stage 1 through stage 4 chronic kidney disease, or unspecified chronic kidney disease: Secondary | ICD-10-CM | POA: Insufficient documentation

## 2017-11-20 DIAGNOSIS — R05 Cough: Secondary | ICD-10-CM | POA: Insufficient documentation

## 2017-11-20 DIAGNOSIS — F1210 Cannabis abuse, uncomplicated: Secondary | ICD-10-CM | POA: Insufficient documentation

## 2017-11-20 DIAGNOSIS — R5383 Other fatigue: Secondary | ICD-10-CM | POA: Diagnosis not present

## 2017-11-20 DIAGNOSIS — J441 Chronic obstructive pulmonary disease with (acute) exacerbation: Secondary | ICD-10-CM | POA: Diagnosis not present

## 2017-11-20 DIAGNOSIS — Z794 Long term (current) use of insulin: Secondary | ICD-10-CM | POA: Insufficient documentation

## 2017-11-20 DIAGNOSIS — I252 Old myocardial infarction: Secondary | ICD-10-CM | POA: Diagnosis not present

## 2017-11-20 DIAGNOSIS — E039 Hypothyroidism, unspecified: Secondary | ICD-10-CM | POA: Insufficient documentation

## 2017-11-20 DIAGNOSIS — N182 Chronic kidney disease, stage 2 (mild): Secondary | ICD-10-CM | POA: Insufficient documentation

## 2017-11-20 DIAGNOSIS — R0602 Shortness of breath: Secondary | ICD-10-CM | POA: Diagnosis not present

## 2017-11-20 DIAGNOSIS — Z9981 Dependence on supplemental oxygen: Secondary | ICD-10-CM | POA: Insufficient documentation

## 2017-11-20 LAB — CBC
HEMATOCRIT: 38 % (ref 36.0–46.0)
Hemoglobin: 11.5 g/dL — ABNORMAL LOW (ref 12.0–15.0)
MCH: 29 pg (ref 26.0–34.0)
MCHC: 30.3 g/dL (ref 30.0–36.0)
MCV: 95.7 fL (ref 78.0–100.0)
PLATELETS: 250 10*3/uL (ref 150–400)
RBC: 3.97 MIL/uL (ref 3.87–5.11)
RDW: 14.4 % (ref 11.5–15.5)
WBC: 9.2 10*3/uL (ref 4.0–10.5)

## 2017-11-20 LAB — BASIC METABOLIC PANEL
Anion gap: 8 (ref 5–15)
BUN: 17 mg/dL (ref 8–23)
CALCIUM: 9 mg/dL (ref 8.9–10.3)
CO2: 35 mmol/L — ABNORMAL HIGH (ref 22–32)
CREATININE: 0.99 mg/dL (ref 0.44–1.00)
Chloride: 100 mmol/L (ref 98–111)
GFR calc Af Amer: >60 mL/min (ref >60)
GFR, EST NON AFRICAN AMERICAN: 60 mL/min — AB (ref >60)
Glucose, Bld: 152 mg/dL — ABNORMAL HIGH (ref 70–99)
POTASSIUM: 3.2 mmol/L — AB (ref 3.5–5.1)
SODIUM: 143 mmol/L (ref 135–145)

## 2017-11-20 LAB — BRAIN NATRIURETIC PEPTIDE: B NATRIURETIC PEPTIDE 5: 219.7 pg/mL — AB (ref 0.0–100.0)

## 2017-11-20 LAB — I-STAT TROPONIN, ED
Troponin i, poc: 0.01 ng/mL (ref 0.00–0.08)
Troponin i, poc: 0.04 ng/mL (ref 0.00–0.08)

## 2017-11-20 LAB — MAGNESIUM: Magnesium: 1.5 mg/dL — ABNORMAL LOW (ref 1.7–2.4)

## 2017-11-20 MED ORDER — IPRATROPIUM BROMIDE 0.02 % IN SOLN
0.5000 mg | Freq: Once | RESPIRATORY_TRACT | Status: AC
  Administered 2017-11-20: 0.5 mg via RESPIRATORY_TRACT
  Filled 2017-11-20: qty 2.5

## 2017-11-20 MED ORDER — ALBUTEROL SULFATE (2.5 MG/3ML) 0.083% IN NEBU: 2.5000 mg | INHALATION_SOLUTION | RESPIRATORY_TRACT | Status: DC | PRN

## 2017-11-20 MED ORDER — TRAMADOL HCL 50 MG PO TABS
50.0000 mg | ORAL_TABLET | Freq: Once | ORAL | Status: AC
  Administered 2017-11-21: 50 mg via ORAL
  Filled 2017-11-20: qty 1

## 2017-11-20 MED ORDER — MAGNESIUM SULFATE IN D5W 1-5 GM/100ML-% IV SOLN
1.0000 g | Freq: Once | INTRAVENOUS | Status: AC
  Administered 2017-11-20: 1 g via INTRAVENOUS
  Filled 2017-11-20: qty 100

## 2017-11-20 MED ORDER — IPRATROPIUM-ALBUTEROL 0.5-2.5 (3) MG/3ML IN SOLN: 3.0000 mL | Freq: Four times a day (QID) | RESPIRATORY_TRACT | Status: DC

## 2017-11-20 MED ORDER — METHYLPREDNISOLONE SODIUM SUCC 125 MG IJ SOLR
125.0000 mg | Freq: Once | INTRAMUSCULAR | Status: AC
  Administered 2017-11-20: 125 mg via INTRAVENOUS
  Filled 2017-11-20: qty 2

## 2017-11-20 MED ORDER — POTASSIUM CHLORIDE CRYS ER 20 MEQ PO TBCR
40.0000 meq | EXTENDED_RELEASE_TABLET | Freq: Once | ORAL | Status: AC
  Administered 2017-11-20: 40 meq via ORAL
  Filled 2017-11-20: qty 2

## 2017-11-20 MED ORDER — ALBUTEROL (5 MG/ML) CONTINUOUS INHALATION SOLN
10.0000 mg/h | INHALATION_SOLUTION | Freq: Once | RESPIRATORY_TRACT | Status: AC
  Administered 2017-11-20: 10 mg/h via RESPIRATORY_TRACT
  Filled 2017-11-20: qty 20

## 2017-11-20 NOTE — ED Notes (Signed)
Family at bedside. 

## 2017-11-20 NOTE — ED Notes (Signed)
Pt reports worsening SOB and chest tightness x 3 days.  She has COPD of which she is on home O2 at 2L .  She also has CHF.  She also endorses R thigh pain.  She reports having an angioplasty sometime last month and has been having pain since.  She is A&Ox 4 and in NAD

## 2017-11-20 NOTE — ED Provider Notes (Signed)
[facility] EMERGENCY DEPARTMENT Provider Note   CSN: [id number] Arrival date & time: 11/20/17  1758     History   Chief Complaint Chief Complaint  Patient presents with  . Shortness of Breath    HPI Crystal Yates is a 62 y.o. female.  HPI Pt presents with shortness of breath.  It started a few days ago but got worse today.  When she walks the symptoms get worse.  She gets fatigued and more short of breath.  No fevers.   SHe has been coughing some, white to yellow sputum.  She is on oxygen at home.  She has tried her breathing treatments but it has not helped much.  Pt does still smoke Past Medical History:  Diagnosis Date  . Acute on chronic systolic heart failure exacerbation(HCC) 04/08/2016  . CAD in native artery    a. Prior LAD stenting based on cath. b. RCA stenting 03/2016 x2.  . Chronic combined systolic and diastolic CHF (congestive heart failure) ([place])   . CKD (chronic kidney disease), stage II   . COPD (chronic obstructive pulmonary disease) ([place])   . Diabetes mellitus without complication ([place])   . Dyspnea   . Hashimoto's thyroiditis   . Hyperlipidemia   . Hypertension   . Myocardial infarction ([place])   . On home oxygen therapy    "2L; 24/7" (10/23/2017)  . Secondary adrenal insufficiency ([place])   . Thyroid disease   . Tobacco abuse     Patient Active Problem List   Diagnosis Date Noted  . Coronary stent restenosis due to progression of disease   . Volume overload   . Pseudoaneurysm following procedure ([place])   . Pseudoaneurysm ([place]) 10/17/2017  . S/P angioplasty for ISR of dRCA 10/13/17 10/14/2017  . CAD (coronary artery disease) 10/13/2017  . Preop examination 10/10/2017  . Acute respiratory distress 09/16/2017  . Right upper lobe pneumonia ([place]) 05/06/2017  . Pneumonia due to respiratory syncytial virus (RSV) 04/21/2017  . Entrapment, radial nerve, right 03/31/2017  . COPD exacerbation ([place]) 10/14/2016  . Chronic combined  systolic and diastolic CHF (congestive heart failure) ([place]) 10/13/2016  . Chest tightness or pressure 10/12/2016  . Dyspnea on exertion 09/29/2016  . History of coronary artery disease 09/29/2016  . OAB (overactive bladder) 09/21/2016  . Neck pain 09/08/2016  . Paresthesia of arm 09/08/2016  . Flank pain 08/31/2016  . Oxygen dependent 06/20/2016  . Neuralgia of right upper extremity 04/26/2016  . Coronary artery disease 04/16/2016  . Lymphocele after surgical procedure 04/16/2016  . Ventricular tachycardia (paroxysmal) ([place]) 04/09/2016  . Acute on chronic systolic heart failure exacerbation(HCC) 04/08/2016  . Ischemic cardiomyopathy 04/08/2016  . Type 2 diabetes mellitus without complication, with long-term current use of insulin ([place]) 04/08/2016  . Anemia, blood loss 09/16/2015  . GI bleeding 09/16/2015  . Chest pain 05/22/2015  . Hypokalemia 05/22/2015  . Hypothyroid 05/22/2015  . Status post hernia repair 01/29/2015  . Surgical wound breakdown 01/29/2015  . Hernia with strangulation 01/18/2015  . Tobacco abuse 01/18/2015  . Recurrent incisional hernia with incarceration 01/15/2015  . Sleep apnea 01/15/2015  . S/P drug eluting coronary stent placement 10/16/2014  . Adrenal insufficiency ([place]) 11/21/2013  . Anemia 11/21/2013  . Unstable angina ([place]) 11/21/2013  . Atherosclerotic heart disease of native coronary artery without angina pectoris 11/21/2013  . Cardiomyopathy ([place]) 11/21/2013  . Congestive heart failure ([place]) 11/21/2013  . Diabetes mellitus with no complication ([place]) 14/97/0263  . Edema 11/21/2013  .  Empty sella syndrome ([place]) 11/21/2013  . Hepatitis 11/21/2013  . Hyperlipemia 11/21/2013  . Hypertension 11/21/2013  . Kidney cyst, acquired 11/21/2013  . Lymph nodes enlarged 11/21/2013  . Fat necrosis 11/21/2013  . Pulmonary emphysema ([place]) 11/21/2013  . Renal disorder 11/21/2013  . Shortness of breath 11/21/2013  . Sjogrens syndrome ([place]) 11/21/2013  .  Thyroid disorder 11/21/2013  . Nicotine dependence, uncomplicated 25/85/2778  . Major depressive disorder, recurrent episode ([place]) 09/17/2013  . Displacement of lumbar intervertebral disc 06/14/2013  . Intractable migraine without aura 06/14/2013  . Mild cognitive impairment 06/14/2013  . Polyneuropathy in diabetes ([place]) 06/14/2013  . Secondary adrenal insufficiency ([place]) 04/12/2011  . DDD (degenerative disc disease), lumbosacral 01/11/2008  . Thoracic or lumbosacral neuritis or radiculitis 01/11/2008    Past Surgical History:  Procedure Laterality Date  . ABDOMINAL SURGERY    . CESAREAN SECTION    . CHOLECYSTECTOMY    . COLON RESECTION    . CORONARY BALLOON ANGIOPLASTY N/A 10/13/2017   Procedure: CORONARY BALLOON ANGIOPLASTY;  Surgeon: Belva Crome, MD;  Location: [facility];  Service: Cardiovascular;  Laterality: N/A;  . HERNIA MESH REMOVAL    . HERNIA REPAIR    . LEFT HEART CATH AND CORONARY ANGIOGRAPHY N/A 10/13/2016   Procedure: Left Heart Cath and Coronary Angiography;  Surgeon: Nelva Bush, MD;  Location: [facility];  Service: Cardiovascular;  Laterality: N/A;  . RIGHT/LEFT HEART CATH AND CORONARY ANGIOGRAPHY N/A 10/13/2017   Procedure: RIGHT/LEFT HEART CATH AND CORONARY ANGIOGRAPHY;  Surgeon: Belva Crome, MD;  Location: [facility];  Service: Cardiovascular;  Laterality: N/A;  . SHOULDER ARTHROSCOPY    . TUBAL LIGATION       OB History   None      Home Medications    Prior to Admission medications   Medication Sig Start Date End Date Taking? Authorizing Provider  acetaminophen (TYLENOL) 325 MG tablet Take 2 tablets (650 mg total) by mouth every 4 (four) hours as needed for headache or mild pain. 10/14/17   Isaiah Serge, NP  albuterol [facility] HFA) 108 (90 Base) MCG/ACT inhaler Inhale 2 puffs into the lungs See admin instructions. Inhale 2 puffs into the lungs every 4-6 hours as needed for shortness of breath or wheezing 07/24/17    [provider]  aspirin 81 MG chewable tablet Chew 1 tablet (81 mg total) by mouth daily. 10/14/17   Isaiah Serge, NP  atorvastatin (LIPITOR) 80 MG tablet Take 1 tablet (80 mg total) by mouth daily at 6 PM. 10/14/17   Isaiah Serge, NP  CALCIUM PO Take 1 tablet by mouth daily.    [provider]  Cholecalciferol (VITAMIN D3) 2000 units capsule Take 2,000 Units by mouth daily.     [provider]  clopidogrel (PLAVIX) 75 MG tablet Take 75 mg by mouth daily.    [provider]  dexamethasone (DECADRON) 2 MG tablet Take 1 tablet (2 mg total) by mouth daily. 11/21/17   Dorie Rank, MD  DULoxetine (CYMBALTA) 60 MG capsule Take 60 mg by mouth daily.  12/05/16   [provider]  EMGALITY 120 MG/ML SOAJ Inject 120 mg into the skin every 30 (thirty) days.  07/30/17   [provider]  fludrocortisone (FLORINEF) 0.1 MG tablet Take 1 tablet (0.1 mg total) by mouth 2 (two) times daily. 10/26/17   Neva Seat, MD  hydrocortisone (CORTEF) 5 MG tablet Take 5-15 mg by mouth See admin instructions.  Take 15 mg by mouth in the morning and 5 mg in the afternoon    [provider]  Insulin Aspart, w/Niacinamide, (FIASP FLEXTOUCH) 100 UNIT/ML SOPN Inject 2-14 Units into the skin See admin instructions. Use three times day as needed before meals per sliding scale    [provider]  insulin degludec (TRESIBA FLEXTOUCH) 100 UNIT/ML SOPN FlexTouch Pen Inject 40 Units into the skin at bedtime.  08/28/16   [provider]  Iron-FA-B Cmp-C-Biot-Probiotic (FUSION PLUS) CAPS Take 1 capsule by mouth daily. 10/09/16   [provider]  levothyroxine (SYNTHROID, LEVOTHROID) 150 MCG tablet Take 150 mcg by mouth daily before breakfast.    [provider]  Magnesium 250 MG TABS Take 250 mg by mouth daily.    [provider]  metoprolol succinate (TOPROL-XL) 25 MG 24 hr tablet Take 0.5 tablets (12.5 mg total) by mouth daily. 10/27/17    Ledell Noss, MD  nitroGLYCERIN (NITROSTAT) 0.4 MG SL tablet Place 0.4 mg under the tongue every 5 (five) minutes as needed for chest pain.     [provider]  oxybutynin (DITROPAN XL) 15 MG 24 hr tablet Take 15 mg by mouth at bedtime.    [provider]  potassium chloride SA (K-DUR,KLOR-CON) 20 MEQ tablet Take 1 tablet (20 mEq total) by mouth daily. 03/14/17   Park Liter, MD  pramipexole (MIRAPEX) 1 MG tablet Take 1 mg by mouth 2 (two) times daily.    [provider]  Semaglutide (OZEMPIC) 0.25 or 0.5 MG/DOSE SOPN Inject 0.5 mg into the skin every 30 (thirty) days.     [provider]  torsemide (DEMADEX) 20 MG tablet Take 2 tablets (40 mg total) by mouth 2 (two) times daily. 10/21/17   Alphonzo Grieve, MD  traMADol (ULTRAM) 50 MG tablet Take 1 tablet (50 mg total) by mouth every 6 (six) hours as needed. 11/21/17   Dorie Rank, MD  TRELEGY ELLIPTA 100-62.5-25 MCG/INH AEPB Inhale 1 puff into the lungs daily. 08/30/17   [provider]  TRULICITY 1.5 OV/7.8HY SOPN Inject 1.5 mg into the skin every Sunday.  10/10/16   [provider]    Family History Family History  Problem Relation Age of Onset  . Stroke Mother   . Diabetes Father   . Diabetes Sister   . Diabetes Sister   . Diabetes Son     Social History Social History   Tobacco Use  . Smoking status: Current Every Day Smoker    Packs/day: 0.75    Years: 44.00    Pack years: 33.00    Types: Cigarettes  . Smokeless tobacco: Never Used  Substance Use Topics  . Alcohol use: Yes    Alcohol/week: 7.0 standard drinks    Types: 7 Shots of liquor per week  . Drug use: Yes    Types: Marijuana    Comment: 10/23/2017 "had 1 ~ 1 month ago; nothing before or since"     Allergies   Hydroxychloroquine; Donepezil; Prednisone; Anticoagulant cit dext [acd formula a]; Bupropion; Metrizamide; Varenicline; and Tape   Review of Systems Review of Systems  All other systems reviewed  and are negative.    Physical Exam Updated Vital Signs BP 124/69   Pulse 98   Temp 98.1 F (36.7 C)   Resp (!) 21   Ht 1.575 m (5\' 2" )   Wt 101.1 kg   SpO2 98%   BMI 40.75 kg/m   Physical Exam  Constitutional: She appears well-developed  and well-nourished. No distress.  HENT:  Head: Normocephalic and atraumatic.  Right Ear: External ear normal.  Left Ear: External ear normal.  Eyes: Conjunctivae are normal. Right eye exhibits no discharge. Left eye exhibits no discharge. No scleral icterus.  Neck: Neck supple. No tracheal deviation present.  Cardiovascular: Normal rate, regular rhythm and intact distal pulses.  Pulmonary/Chest: Effort normal. No stridor. No respiratory distress. She has wheezes. She has rales.  Abdominal: Soft. Bowel sounds are normal. She exhibits no distension. There is no tenderness. There is no rebound and no guarding.  Musculoskeletal: She exhibits no edema or tenderness.  Hematoma right inguinal region  Neurological: She is alert. She has normal strength. No cranial nerve deficit (no facial droop, extraocular movements intact, no slurred speech) or sensory deficit. She exhibits normal muscle tone. She displays no seizure activity. Coordination normal.  Skin: Skin is warm and dry. No rash noted.  Psychiatric: She has a normal mood and affect.  Nursing note and vitals reviewed.    ED Treatments / Results  Labs (all labs ordered are listed, but only abnormal results are displayed) Labs Reviewed  BASIC METABOLIC PANEL - Abnormal; Notable for the following components:      Result Value   Potassium 3.2 (*)    CO2 35 (*)    Glucose, Bld 152 (*)    GFR calc non Af Amer 60 (*)    All other components within normal limits  CBC - Abnormal; Notable for the following components:   Hemoglobin 11.5 (*)    All other components within normal limits  BRAIN NATRIURETIC PEPTIDE - Abnormal; Notable for the following components:   B Natriuretic Peptide 219.7 (*)      All other components within normal limits  MAGNESIUM - Abnormal; Notable for the following components:   Magnesium 1.5 (*)    All other components within normal limits  I-STAT TROPONIN, ED  I-STAT TROPONIN, ED    EKG None  Radiology Dg Chest 2 View  Result Date: 11/20/2017 CLINICAL DATA:  Shortness of breath for the past 3 days, worse this morning and just prior to arrival today. EXAM: CHEST - 2 VIEW COMPARISON:  10/27/2017. FINDINGS: Normal sized heart. More confluent density at the left lateral lung base. Otherwise, clear lungs. Coronary artery stents. No acute bony abnormality. Cervical spine fixation hardware. IMPRESSION: More consolidated atelectasis at the left lateral lung base. This does not have the typical appearance of pneumonia. Electronically Signed   By: Claudie Revering M.D.   On: 11/20/2017 19:19    Procedures Procedures (including critical care time)  Medications Ordered in ED Medications  ipratropium-albuterol (DUONEB) 0.5-2.5 (3) MG/3ML nebulizer solution 3 mL (has no administration in time range)  albuterol (PROVENTIL) (2.5 MG/3ML) 0.083% nebulizer solution 2.5 mg (has no administration in time range)  traMADol (ULTRAM) tablet 50 mg (has no administration in time range)  methylPREDNISolone sodium succinate (SOLU-MEDROL) 125 mg/2 mL injection 125 mg (125 mg Intravenous Given 11/20/17 2103)  albuterol (PROVENTIL,VENTOLIN) solution continuous neb (10 mg/hr Nebulization Given 11/20/17 2112)  ipratropium (ATROVENT) nebulizer solution 0.5 mg (0.5 mg Nebulization Given 11/20/17 2112)  potassium chloride SA (K-DUR,KLOR-CON) CR tablet 40 mEq (40 mEq Oral Given 11/20/17 2102)  magnesium sulfate IVPB 1 g 100 mL (1 g Intravenous New Bag/Given 11/20/17 2302)     Initial Impression / Assessment and Plan / ED Course  I have reviewed the triage vital signs and the nursing notes.  Pertinent labs & imaging results that were available during  my care of the patient were reviewed by me  and considered in my medical decision making (see chart for details).  Clinical Course as of Nov 21 8  Mon Nov 20, 2017  2149 Magnesium level decreased.  Will give a dose of iv magnesium   [JK]  2349 Patient requests medication for her thigh pain.  I will give her a dose of Ultram   [JK]    Clinical Course User Index [JK] Dorie Rank, MD    She presented to the emergency room with complaints of shortness of breath.  She has a history of COPD and CHF.  She unfortunately continues to smoke.  ED work-up did not suggest pneumonia or CHF exacerbation.  Her troponin was normal BNP was mildly elevated but this is at her baseline.  Patient's magnesium level was low.  She was given a dose of magnesium IV.  She was also given breathing treatments and steroids.  Patient improved and was able to ambulate with less dyspnea.  Patient also mentioned having some trouble with eye pain associated with a hematoma on her right inguinal region after a cardiac catheterization.  Patient was given a dose of Ultram.  I will give her prescription for Ultram and Decadron.  I suggested close follow-up with her pulmonary-primary doctor.  Final Clinical Impressions(s) / ED Diagnoses   Final diagnoses:  COPD exacerbation ([place])  Hypomagnesemia    ED Discharge Orders         Ordered    dexamethasone (DECADRON) 2 MG tablet  Daily     11/21/17 0009    traMADol (ULTRAM) 50 MG tablet  Every 6 hours PRN     11/21/17 0010           Dorie Rank, MD 11/21/17 [phone]

## 2017-11-20 NOTE — ED Notes (Signed)
RT at bedside to start neb tx as ordered 

## 2017-11-20 NOTE — Telephone Encounter (Signed)
Following up on this today and patient also reports shortness of breath that started yesterday and palpitations, along with 2 lb. Weight increase since 11/16/17. Patient has been using breathing treatments with little relief. Will consults Dr. Fraser Din.

## 2017-11-20 NOTE — Telephone Encounter (Signed)
Patient informed to have lab work drawn tomorrow and we will schedule groin [place].

## 2017-11-20 NOTE — ED Triage Notes (Signed)
Patient to ED c/o worsening shortness of breath x 3 days, worse this morning and just prior to arrival. Hx COPD and CHF - wears 2 L O2 nasal cannula. Shortness of breath worse with ambulating. She denies CP but endorses some dizziness with walking as well. She thinks she may be holding onto some extra fluid because her weight was up this morning. She also c/o R leg pain from a recent surgery. Also had recent cardiac cath in July. Resp e/u at rest, skin warm/dry.

## 2017-11-20 NOTE — Addendum Note (Signed)
Addended by: Ashok Norris on: 11/20/2017 12:20 PM   Modules accepted: Orders

## 2017-11-21 MED ORDER — DEXAMETHASONE 2 MG PO TABS: 2.0000 mg | ORAL_TABLET | Freq: Every day | ORAL | 0 refills | Status: DC

## 2017-11-21 MED ORDER — TRAMADOL HCL 50 MG PO TABS: 50.0000 mg | ORAL_TABLET | Freq: Four times a day (QID) | ORAL | 0 refills | Status: DC | PRN

## 2017-11-21 NOTE — Telephone Encounter (Signed)
Patient has been scheduled for groin ultrasound

## 2017-11-21 NOTE — Discharge Instructions (Addendum)
Take the medications as prescribed, follow-up with your doctor later this week to make sure you are improving, you can take the Ultram to see if that helps with your thigh pain

## 2017-11-23 ENCOUNTER — Other Ambulatory Visit: Payer: Self-pay | Admitting: *Deleted

## 2017-11-23 ENCOUNTER — Telehealth: Payer: Self-pay | Admitting: Cardiology

## 2017-11-23 ENCOUNTER — Ambulatory Visit (HOSPITAL_BASED_OUTPATIENT_CLINIC_OR_DEPARTMENT_OTHER)
Admission: RE | Admit: 2017-11-23 | Discharge: 2017-11-23 | Disposition: A | Discharge status: Home / Self Care | Payer: Federal, State, Local not specified - PPO | Source: Ambulatory Visit | Attending: Cardiology | Admitting: Cardiology

## 2017-11-23 ENCOUNTER — Telehealth: Payer: Self-pay | Admitting: *Deleted

## 2017-11-23 DIAGNOSIS — I97621 Postprocedural hematoma of a circulatory system organ or structure following other procedure: Secondary | ICD-10-CM | POA: Insufficient documentation

## 2017-11-23 DIAGNOSIS — I729 Aneurysm of unspecified site: Secondary | ICD-10-CM

## 2017-11-23 DIAGNOSIS — T81718A Complication of other artery following a procedure, not elsewhere classified, initial encounter: Secondary | ICD-10-CM

## 2017-11-23 DIAGNOSIS — I9789 Other postprocedural complications and disorders of the circulatory system, not elsewhere classified: Secondary | ICD-10-CM | POA: Diagnosis not present

## 2017-11-23 DIAGNOSIS — S301XXD Contusion of abdominal wall, subsequent encounter: Secondary | ICD-10-CM

## 2017-11-23 NOTE — Progress Notes (Signed)
VS [place] GROIN PSEUDOANEURYSM DUPLEX       Large lower right abdominal wall hematoma with no evidence of arterial flow connection.   11/23/17 Cardell Peach

## 2017-11-23 NOTE — Telephone Encounter (Signed)
After reviewing right groin ultrasound, Dr. Agustin Cree advised an urgent referral be placed for patient to see vascular surgery. Referral order has been placed. Left message for patient to return call to inform her of plan.

## 2017-11-23 NOTE — Telephone Encounter (Signed)
Patient wanted Dr. Raliegh Ip to know she did not come in for labs because she was just in [facility] 2 days ago and records are in system.

## 2017-11-24 ENCOUNTER — Emergency Department (HOSPITAL_COMMUNITY)
Admission: EM | Admit: 2017-11-24 | Discharge: 2017-11-24 | Disposition: A | Discharge status: Home / Self Care | Payer: Federal, State, Local not specified - PPO | Attending: Emergency Medicine | Admitting: Emergency Medicine

## 2017-11-24 ENCOUNTER — Other Ambulatory Visit: Payer: Self-pay

## 2017-11-24 ENCOUNTER — Encounter (HOSPITAL_COMMUNITY): Payer: Self-pay | Admitting: Emergency Medicine

## 2017-11-24 ENCOUNTER — Telehealth: Payer: Self-pay | Admitting: *Deleted

## 2017-11-24 DIAGNOSIS — J449 Chronic obstructive pulmonary disease, unspecified: Secondary | ICD-10-CM | POA: Insufficient documentation

## 2017-11-24 DIAGNOSIS — N182 Chronic kidney disease, stage 2 (mild): Secondary | ICD-10-CM | POA: Diagnosis not present

## 2017-11-24 DIAGNOSIS — I2510 Atherosclerotic heart disease of native coronary artery without angina pectoris: Secondary | ICD-10-CM | POA: Diagnosis not present

## 2017-11-24 DIAGNOSIS — R1031 Right lower quadrant pain: Secondary | ICD-10-CM

## 2017-11-24 DIAGNOSIS — Z794 Long term (current) use of insulin: Secondary | ICD-10-CM | POA: Insufficient documentation

## 2017-11-24 DIAGNOSIS — R109 Unspecified abdominal pain: Secondary | ICD-10-CM | POA: Diagnosis not present

## 2017-11-24 DIAGNOSIS — Z79899 Other long term (current) drug therapy: Secondary | ICD-10-CM | POA: Diagnosis not present

## 2017-11-24 DIAGNOSIS — I5023 Acute on chronic systolic (congestive) heart failure: Secondary | ICD-10-CM | POA: Insufficient documentation

## 2017-11-24 DIAGNOSIS — F17210 Nicotine dependence, cigarettes, uncomplicated: Secondary | ICD-10-CM | POA: Insufficient documentation

## 2017-11-24 DIAGNOSIS — I130 Hypertensive heart and chronic kidney disease with heart failure and stage 1 through stage 4 chronic kidney disease, or unspecified chronic kidney disease: Secondary | ICD-10-CM | POA: Insufficient documentation

## 2017-11-24 DIAGNOSIS — E119 Type 2 diabetes mellitus without complications: Secondary | ICD-10-CM | POA: Diagnosis not present

## 2017-11-24 LAB — CBC WITH DIFFERENTIAL/PLATELET
Abs Immature Granulocytes: 0.1 10*3/uL (ref 0.0–0.1)
BASOS PCT: 0 %
Basophils Absolute: 0 10*3/uL (ref 0.0–0.1)
EOS ABS: 0 10*3/uL (ref 0.0–0.7)
EOS PCT: 0 %
HEMATOCRIT: 40.1 % (ref 36.0–46.0)
Hemoglobin: 12.3 g/dL (ref 12.0–15.0)
Immature Granulocytes: 1 %
LYMPHS ABS: 1.7 10*3/uL (ref 0.7–4.0)
Lymphocytes Relative: 15 %
MCH: 29.5 pg (ref 26.0–34.0)
MCHC: 30.7 g/dL (ref 30.0–36.0)
MCV: 96.2 fL (ref 78.0–100.0)
MONOS PCT: 4 %
Monocytes Absolute: 0.5 10*3/uL (ref 0.1–1.0)
NEUTROS ABS: 8.7 10*3/uL — AB (ref 1.7–7.7)
NEUTROS PCT: 80 %
PLATELETS: 287 10*3/uL (ref 150–400)
RBC: 4.17 MIL/uL (ref 3.87–5.11)
RDW: 14.4 % (ref 11.5–15.5)
WBC: 11 10*3/uL — AB (ref 4.0–10.5)

## 2017-11-24 LAB — BASIC METABOLIC PANEL
Anion gap: 8 (ref 5–15)
BUN: 23 mg/dL (ref 8–23)
CO2: 34 mmol/L — AB (ref 22–32)
CREATININE: 1.22 mg/dL — AB (ref 0.44–1.00)
Calcium: 9.4 mg/dL (ref 8.9–10.3)
Chloride: 99 mmol/L (ref 98–111)
GFR calc Af Amer: 54 mL/min — ABNORMAL LOW (ref >60)
GFR calc non Af Amer: 46 mL/min — ABNORMAL LOW (ref >60)
GLUCOSE: 262 mg/dL — AB (ref 70–99)
Potassium: 3.7 mmol/L (ref 3.5–5.1)
Sodium: 141 mmol/L (ref 135–145)

## 2017-11-24 NOTE — ED Notes (Signed)
Dr. Venora Maples speaking with patient in waiting area

## 2017-11-24 NOTE — Telephone Encounter (Signed)
Patient informed of plan and verbalized understanding. No further questions.

## 2017-11-24 NOTE — ED Triage Notes (Signed)
Pt report RLQ pain. Pt reports seen by PCP and had a sonogram done, was told to come to the ER to be seen by vascular surgeon due to possible internal bleeding.

## 2017-11-24 NOTE — Discharge Instructions (Addendum)
Call the vascular surgeons on Tuesday for follow up

## 2017-11-24 NOTE — Telephone Encounter (Signed)
Patient called and stated she had an urgent referral to this office from Dr. Wendy Poet office due to enlarging hematoma at right groin s/p PTCA in July and subsequent bleeding post procedure. She states this area is getting bigger now. Instructed patient to go to [facility] ER  and Triage nurse called.

## 2017-11-24 NOTE — ED Provider Notes (Signed)
.  Patient placed in Quick Look pathway, seen and evaluated   Chief Complaint: abdominal pain  HPI:   Crystal Yates is a 62 y.o. female with hx of CHF, CAD, CKD, COPD, DM, HTN, MI, adrenal insufficiency, thyroid disease and tobacco abuse who presents to the ED for bleeding. Patient reports that she went to [facility] yesterday for ultrasound of the right groin. They called today from the cardiology office and told to come to the ED for vascular to see due to bleeding.   ROS: GI: abdominal pain  Resp: shortness of breath that is chronic  Physical Exam:  BP (!) 155/87 (BP Location: Right Arm)   Pulse 86   Temp 97.9 F (36.6 C) (Oral)   Resp 16   SpO2 97%    Gen: No distress  Neuro: Awake and Alert  Skin: Warm dry, tender right RLQ abdominal pain and right inguinal area.    Initiation of care has begun. The patient has been counseled on the process, plan, and necessity for staying for the completion/evaluation, and the remainder of the medical screening examination    Ashley Murrain, NP 11/24/17 Alease Medina, MD 11/25/17 [phone]

## 2017-11-24 NOTE — ED Notes (Signed)
Dr. Venora Maples to review chart for any further work up while pt is waiting

## 2017-11-25 NOTE — ED Provider Notes (Signed)
[facility] EMERGENCY DEPARTMENT Provider Note   CSN: [id number] Arrival date & time: 11/24/17  1737     History   Chief Complaint Chief Complaint  Patient presents with  . bleeding    HPI Crystal Yates is a 62 y.o. female.  HPI 62 year old female who underwent coronary catheterization with a right groin approach at the end of July 2019.  She developed post operative complications with bleeding at the catheter site in her right groin.  She has been doing well and she has been healing however 4 to 5 days ago she developed some increasing pain in her right groin and increasing swelling.  She was seen by her cardiologist yesterday and outpatient ultrasound was obtained which demonstrated a large hematoma in her right groin without arterial connection.  She presents the emergency department at the request of her cardiologist for possible vascular surgery consultation.  On arrival to the emergency department she is without any significant complaints except for mild pain in the right groin.  No lightheadedness or dizziness.   Past Medical History:  Diagnosis Date  . Acute on chronic systolic heart failure exacerbation(HCC) 04/08/2016  . CAD in native artery    a. Prior LAD stenting based on cath. b. RCA stenting 03/2016 x2.  . Chronic combined systolic and diastolic CHF (congestive heart failure) ([place])   . CKD (chronic kidney disease), stage II   . COPD (chronic obstructive pulmonary disease) ([place])   . Diabetes mellitus without complication ([place])   . Dyspnea   . Hashimoto's thyroiditis   . Hyperlipidemia   . Hypertension   . Myocardial infarction ([place])   . On home oxygen therapy    "2L; 24/7" (10/23/2017)  . Secondary adrenal insufficiency ([place])   . Thyroid disease   . Tobacco abuse     Patient Active Problem List   Diagnosis Date Noted  . Coronary stent restenosis due to progression of disease   . Volume overload   . Pseudoaneurysm following  procedure ([place])   . Pseudoaneurysm ([place]) 10/17/2017  . S/P angioplasty for ISR of dRCA 10/13/17 10/14/2017  . CAD (coronary artery disease) 10/13/2017  . Preop examination 10/10/2017  . Acute respiratory distress 09/16/2017  . Right upper lobe pneumonia ([place]) 05/06/2017  . Pneumonia due to respiratory syncytial virus (RSV) 04/21/2017  . Entrapment, radial nerve, right 03/31/2017  . COPD exacerbation ([place]) 10/14/2016  . Chronic combined systolic and diastolic CHF (congestive heart failure) ([place]) 10/13/2016  . Chest tightness or pressure 10/12/2016  . Dyspnea on exertion 09/29/2016  . History of coronary artery disease 09/29/2016  . OAB (overactive bladder) 09/21/2016  . Neck pain 09/08/2016  . Paresthesia of arm 09/08/2016  . Flank pain 08/31/2016  . Oxygen dependent 06/20/2016  . Neuralgia of right upper extremity 04/26/2016  . Coronary artery disease 04/16/2016  . Lymphocele after surgical procedure 04/16/2016  . Ventricular tachycardia (paroxysmal) ([place]) 04/09/2016  . Acute on chronic systolic heart failure exacerbation(HCC) 04/08/2016  . Ischemic cardiomyopathy 04/08/2016  . Type 2 diabetes mellitus without complication, with long-term current use of insulin ([place]) 04/08/2016  . Anemia, blood loss 09/16/2015  . GI bleeding 09/16/2015  . Chest pain 05/22/2015  . Hypokalemia 05/22/2015  . Hypothyroid 05/22/2015  . Status post hernia repair 01/29/2015  . Surgical wound breakdown 01/29/2015  . Hernia with strangulation 01/18/2015  . Tobacco abuse 01/18/2015  . Recurrent incisional hernia with incarceration 01/15/2015  . Sleep apnea 01/15/2015  . S/P drug eluting coronary stent placement  10/16/2014  . Adrenal insufficiency ([place]) 11/21/2013  . Anemia 11/21/2013  . Unstable angina ([place]) 11/21/2013  . Atherosclerotic heart disease of native coronary artery without angina pectoris 11/21/2013  . Cardiomyopathy ([place]) 11/21/2013  . Congestive heart failure ([place]) 11/21/2013  .  Diabetes mellitus with no complication ([place]) 16/12/9602  . Edema 11/21/2013  . Empty sella syndrome ([place]) 11/21/2013  . Hepatitis 11/21/2013  . Hyperlipemia 11/21/2013  . Hypertension 11/21/2013  . Kidney cyst, acquired 11/21/2013  . Lymph nodes enlarged 11/21/2013  . Fat necrosis 11/21/2013  . Pulmonary emphysema ([place]) 11/21/2013  . Renal disorder 11/21/2013  . Shortness of breath 11/21/2013  . Sjogrens syndrome ([place]) 11/21/2013  . Thyroid disorder 11/21/2013  . Nicotine dependence, uncomplicated 54/11/8117  . Major depressive disorder, recurrent episode ([place]) 09/17/2013  . Displacement of lumbar intervertebral disc 06/14/2013  . Intractable migraine without aura 06/14/2013  . Mild cognitive impairment 06/14/2013  . Polyneuropathy in diabetes ([place]) 06/14/2013  . Secondary adrenal insufficiency ([place]) 04/12/2011  . DDD (degenerative disc disease), lumbosacral 01/11/2008  . Thoracic or lumbosacral neuritis or radiculitis 01/11/2008    Past Surgical History:  Procedure Laterality Date  . ABDOMINAL SURGERY    . CESAREAN SECTION    . CHOLECYSTECTOMY    . COLON RESECTION    . CORONARY BALLOON ANGIOPLASTY N/A 10/13/2017   Procedure: CORONARY BALLOON ANGIOPLASTY;  Surgeon: Belva Crome, MD;  Location: [facility];  Service: Cardiovascular;  Laterality: N/A;  . HERNIA MESH REMOVAL    . HERNIA REPAIR    . LEFT HEART CATH AND CORONARY ANGIOGRAPHY N/A 10/13/2016   Procedure: Left Heart Cath and Coronary Angiography;  Surgeon: Nelva Bush, MD;  Location: [facility];  Service: Cardiovascular;  Laterality: N/A;  . RIGHT/LEFT HEART CATH AND CORONARY ANGIOGRAPHY N/A 10/13/2017   Procedure: RIGHT/LEFT HEART CATH AND CORONARY ANGIOGRAPHY;  Surgeon: Belva Crome, MD;  Location: [facility];  Service: Cardiovascular;  Laterality: N/A;  . SHOULDER ARTHROSCOPY    . TUBAL LIGATION       OB History   None      Home Medications    Prior to Admission medications     Medication Sig Start Date End Date Taking? Authorizing Provider  acetaminophen (TYLENOL) 325 MG tablet Take 2 tablets (650 mg total) by mouth every 4 (four) hours as needed for headache or mild pain. 10/14/17   Isaiah Serge, NP  albuterol [facility] HFA) 108 (90 Base) MCG/ACT inhaler Inhale 2 puffs into the lungs See admin instructions. Inhale 2 puffs into the lungs every 4-6 hours as needed for shortness of breath or wheezing 07/24/17   [provider]  aspirin 81 MG chewable tablet Chew 1 tablet (81 mg total) by mouth daily. 10/14/17   Isaiah Serge, NP  atorvastatin (LIPITOR) 80 MG tablet Take 1 tablet (80 mg total) by mouth daily at 6 PM. 10/14/17   Isaiah Serge, NP  CALCIUM PO Take 1 tablet by mouth daily.    [provider]  Cholecalciferol (VITAMIN D3) 2000 units capsule Take 2,000 Units by mouth daily.     [provider]  clopidogrel (PLAVIX) 75 MG tablet Take 75 mg by mouth daily.    [provider]  dexamethasone (DECADRON) 2 MG tablet Take 1 tablet (2 mg total) by mouth daily. 11/21/17   Dorie Rank, MD  DULoxetine (CYMBALTA) 60 MG capsule Take 60 mg by mouth daily.  12/05/16   [provider]  [facility]  120 MG/ML SOAJ Inject 120 mg into the skin every 30 (thirty) days.  07/30/17   [provider]  fludrocortisone (FLORINEF) 0.1 MG tablet Take 1 tablet (0.1 mg total) by mouth 2 (two) times daily. 10/26/17   Neva Seat, MD  hydrocortisone (CORTEF) 5 MG tablet Take 5-15 mg by mouth See admin instructions. Take 15 mg by mouth in the morning and 5 mg in the afternoon    [provider]  Insulin Aspart, w/Niacinamide, (FIASP FLEXTOUCH) 100 UNIT/ML SOPN Inject 2-14 Units into the skin See admin instructions. Use three times day as needed before meals per sliding scale    [provider]  insulin degludec (TRESIBA FLEXTOUCH) 100 UNIT/ML SOPN FlexTouch Pen Inject 40 Units into the skin at bedtime.  08/28/16   [provider]  Iron-FA-B Cmp-C-Biot-Probiotic (FUSION PLUS) CAPS Take 1 capsule by mouth daily. 10/09/16   [provider]  levothyroxine (SYNTHROID, LEVOTHROID) 150 MCG tablet Take 150 mcg by mouth daily before breakfast.    [provider]  Magnesium 250 MG TABS Take 250 mg by mouth daily.    [provider]  metoprolol succinate (TOPROL-XL) 25 MG 24 hr tablet Take 0.5 tablets (12.5 mg total) by mouth daily. 10/27/17   Ledell Noss, MD  nitroGLYCERIN (NITROSTAT) 0.4 MG SL tablet Place 0.4 mg under the tongue every 5 (five) minutes as needed for chest pain.     [provider]  oxybutynin (DITROPAN XL) 15 MG 24 hr tablet Take 15 mg by mouth at bedtime.    [provider]  potassium chloride SA (K-DUR,KLOR-CON) 20 MEQ tablet Take 1 tablet (20 mEq total) by mouth daily. 03/14/17   Park Liter, MD  pramipexole (MIRAPEX) 1 MG tablet Take 1 mg by mouth 2 (two) times daily.    [provider]  Semaglutide (OZEMPIC) 0.25 or 0.5 MG/DOSE SOPN Inject 0.5 mg into the skin every 30 (thirty) days.     [provider]  torsemide (DEMADEX) 20 MG tablet Take 2 tablets (40 mg total) by mouth 2 (two) times daily. 10/21/17   Alphonzo Grieve, MD  traMADol (ULTRAM) 50 MG tablet Take 1 tablet (50 mg total) by mouth every 6 (six) hours as needed. 11/21/17   Dorie Rank, MD  TRELEGY ELLIPTA 100-62.5-25 MCG/INH AEPB Inhale 1 puff into the lungs daily. 08/30/17   [provider]  TRULICITY 1.5 NT/7.0YF SOPN Inject 1.5 mg into the skin every Sunday.  10/10/16   [provider]    Family History Family History  Problem Relation Age of Onset  . Stroke Mother   . Diabetes Father   . Diabetes Sister   . Diabetes Sister   . Diabetes Son     Social History Social History   Tobacco Use  . Smoking status: Current Every Day Smoker    Packs/day: 0.75    Years: 44.00    Pack years: 33.00    Types: Cigarettes  . Smokeless tobacco: Never Used   Substance Use Topics  . Alcohol use: Yes    Alcohol/week: 7.0 standard drinks    Types: 7 Shots of liquor per week  . Drug use: Yes    Types: Marijuana    Comment: 10/23/2017 "had 1 ~ 1 month ago; nothing before or since"     Allergies   Hydroxychloroquine; Donepezil; Prednisone; Anticoagulant cit dext [acd formula a]; Bupropion; Metrizamide; Varenicline; and Tape   Review of Systems Review of Systems  All other systems reviewed and  are negative.    Physical Exam Updated Vital Signs BP (!) 146/85   Pulse 73   Temp 98.8 F (37.1 C) (Oral)   Resp 11   SpO2 98%   Physical Exam  Constitutional: She is oriented to person, place, and time. She appears well-developed and well-nourished.  HENT:  Head: Normocephalic.  Eyes: EOM are normal.  Neck: Normal range of motion.  Pulmonary/Chest: Effort normal.  Abdominal: She exhibits no distension.  Musculoskeletal: Normal range of motion.  No significant palpable hematoma or external signs of ecchymosis of the right groin.  No pulsatile mass.  Normal perfusion to her right foot.  Neurological: She is alert and oriented to person, place, and time.  Psychiatric: She has a normal mood and affect.  Nursing note and vitals reviewed.    ED Treatments / Results  Labs (all labs ordered are listed, but only abnormal results are displayed) Labs Reviewed  CBC WITH DIFFERENTIAL/PLATELET - Abnormal; Notable for the following components:      Result Value   WBC 11.0 (*)    Neutro Abs 8.7 (*)    All other components within normal limits  BASIC METABOLIC PANEL - Abnormal; Notable for the following components:   CO2 34 (*)    Glucose, Bld 262 (*)    Creatinine, Ser 1.22 (*)    GFR calc non Af Amer 46 (*)    GFR calc Af Amer 54 (*)    All other components within normal limits    EKG None  Radiology No results found.  Procedures Procedures (including critical care time)  Medications Ordered in ED Medications - No data to  display   Initial Impression / Assessment and Plan / ED Course  I have reviewed the triage vital signs and the nursing notes.  Pertinent labs & imaging results that were available during my care of the patient were reviewed by me and considered in my medical decision making (see chart for details).     Hemoglobin stable.  Vital signs stable.  No pulsatile mass in the right groin.  Normal perfusion down to the right leg.  I discussed the case with Dr. Oneida Alar who states the patient can be seen in follow-up in the office.  Final Clinical Impressions(s) / ED Diagnoses   Final diagnoses:  Right groin pain    ED Discharge Orders    None       Jola Schmidt, MD 11/25/17 [id number]

## 2017-11-27 DIAGNOSIS — N289 Disorder of kidney and ureter, unspecified: Secondary | ICD-10-CM | POA: Diagnosis not present

## 2017-11-27 DIAGNOSIS — I10 Essential (primary) hypertension: Secondary | ICD-10-CM | POA: Diagnosis not present

## 2017-11-27 DIAGNOSIS — Z9861 Coronary angioplasty status: Secondary | ICD-10-CM | POA: Diagnosis not present

## 2017-11-27 DIAGNOSIS — E785 Hyperlipidemia, unspecified: Secondary | ICD-10-CM | POA: Diagnosis not present

## 2017-11-27 DIAGNOSIS — L7634 Postprocedural seroma of skin and subcutaneous tissue following other procedure: Secondary | ICD-10-CM | POA: Diagnosis not present

## 2017-11-27 DIAGNOSIS — E039 Hypothyroidism, unspecified: Secondary | ICD-10-CM | POA: Diagnosis not present

## 2017-11-27 DIAGNOSIS — B961 Klebsiella pneumoniae [K. pneumoniae] as the cause of diseases classified elsewhere: Secondary | ICD-10-CM | POA: Diagnosis not present

## 2017-11-27 DIAGNOSIS — E2740 Unspecified adrenocortical insufficiency: Secondary | ICD-10-CM | POA: Diagnosis not present

## 2017-11-27 DIAGNOSIS — Z720 Tobacco use: Secondary | ICD-10-CM | POA: Diagnosis not present

## 2017-11-27 DIAGNOSIS — K56600 Partial intestinal obstruction, unspecified as to cause: Secondary | ICD-10-CM | POA: Diagnosis not present

## 2017-11-27 DIAGNOSIS — K56609 Unspecified intestinal obstruction, unspecified as to partial versus complete obstruction: Secondary | ICD-10-CM | POA: Diagnosis not present

## 2017-11-27 DIAGNOSIS — J189 Pneumonia, unspecified organism: Secondary | ICD-10-CM | POA: Diagnosis not present

## 2017-11-27 DIAGNOSIS — N200 Calculus of kidney: Secondary | ICD-10-CM | POA: Diagnosis not present

## 2017-11-27 DIAGNOSIS — Z01810 Encounter for preprocedural cardiovascular examination: Secondary | ICD-10-CM | POA: Diagnosis not present

## 2017-11-27 DIAGNOSIS — I5022 Chronic systolic (congestive) heart failure: Secondary | ICD-10-CM | POA: Diagnosis not present

## 2017-11-27 DIAGNOSIS — K567 Ileus, unspecified: Secondary | ICD-10-CM | POA: Diagnosis not present

## 2017-11-27 DIAGNOSIS — J9622 Acute and chronic respiratory failure with hypercapnia: Secondary | ICD-10-CM | POA: Diagnosis not present

## 2017-11-27 DIAGNOSIS — I5020 Unspecified systolic (congestive) heart failure: Secondary | ICD-10-CM | POA: Diagnosis not present

## 2017-11-27 DIAGNOSIS — R109 Unspecified abdominal pain: Secondary | ICD-10-CM | POA: Diagnosis not present

## 2017-11-27 DIAGNOSIS — R1013 Epigastric pain: Secondary | ICD-10-CM | POA: Diagnosis not present

## 2017-11-27 DIAGNOSIS — J440 Chronic obstructive pulmonary disease with acute lower respiratory infection: Secondary | ICD-10-CM | POA: Diagnosis not present

## 2017-11-27 DIAGNOSIS — I110 Hypertensive heart disease with heart failure: Secondary | ICD-10-CM | POA: Diagnosis not present

## 2017-11-27 DIAGNOSIS — J9621 Acute and chronic respiratory failure with hypoxia: Secondary | ICD-10-CM | POA: Diagnosis not present

## 2017-11-27 DIAGNOSIS — N390 Urinary tract infection, site not specified: Secondary | ICD-10-CM | POA: Diagnosis not present

## 2017-11-27 DIAGNOSIS — E873 Alkalosis: Secondary | ICD-10-CM | POA: Diagnosis not present

## 2017-11-27 DIAGNOSIS — N179 Acute kidney failure, unspecified: Secondary | ICD-10-CM | POA: Diagnosis not present

## 2017-11-27 DIAGNOSIS — S301XXA Contusion of abdominal wall, initial encounter: Secondary | ICD-10-CM | POA: Diagnosis not present

## 2017-11-27 DIAGNOSIS — K5652 Intestinal adhesions [bands] with complete obstruction: Secondary | ICD-10-CM | POA: Diagnosis not present

## 2017-11-27 DIAGNOSIS — E109 Type 1 diabetes mellitus without complications: Secondary | ICD-10-CM | POA: Diagnosis not present

## 2017-11-27 DIAGNOSIS — I2510 Atherosclerotic heart disease of native coronary artery without angina pectoris: Secondary | ICD-10-CM | POA: Diagnosis not present

## 2017-11-27 DIAGNOSIS — J449 Chronic obstructive pulmonary disease, unspecified: Secondary | ICD-10-CM | POA: Diagnosis not present

## 2017-11-28 DIAGNOSIS — K56609 Unspecified intestinal obstruction, unspecified as to partial versus complete obstruction: Secondary | ICD-10-CM | POA: Diagnosis not present

## 2017-11-28 DIAGNOSIS — E039 Hypothyroidism, unspecified: Secondary | ICD-10-CM | POA: Diagnosis not present

## 2017-11-28 DIAGNOSIS — J9811 Atelectasis: Secondary | ICD-10-CM | POA: Diagnosis not present

## 2017-11-28 DIAGNOSIS — I5020 Unspecified systolic (congestive) heart failure: Secondary | ICD-10-CM | POA: Diagnosis not present

## 2017-11-28 DIAGNOSIS — R140 Abdominal distension (gaseous): Secondary | ICD-10-CM | POA: Diagnosis not present

## 2017-11-28 DIAGNOSIS — K56600 Partial intestinal obstruction, unspecified as to cause: Secondary | ICD-10-CM | POA: Diagnosis not present

## 2017-11-28 DIAGNOSIS — S301XXA Contusion of abdominal wall, initial encounter: Secondary | ICD-10-CM | POA: Diagnosis not present

## 2017-11-28 DIAGNOSIS — E2740 Unspecified adrenocortical insufficiency: Secondary | ICD-10-CM | POA: Diagnosis not present

## 2017-11-29 DIAGNOSIS — S301XXA Contusion of abdominal wall, initial encounter: Secondary | ICD-10-CM | POA: Diagnosis not present

## 2017-11-29 DIAGNOSIS — I5020 Unspecified systolic (congestive) heart failure: Secondary | ICD-10-CM | POA: Diagnosis not present

## 2017-11-29 DIAGNOSIS — E2740 Unspecified adrenocortical insufficiency: Secondary | ICD-10-CM | POA: Diagnosis not present

## 2017-11-29 DIAGNOSIS — Z01810 Encounter for preprocedural cardiovascular examination: Secondary | ICD-10-CM | POA: Diagnosis not present

## 2017-11-29 DIAGNOSIS — J90 Pleural effusion, not elsewhere classified: Secondary | ICD-10-CM | POA: Diagnosis not present

## 2017-11-29 DIAGNOSIS — I2510 Atherosclerotic heart disease of native coronary artery without angina pectoris: Secondary | ICD-10-CM | POA: Diagnosis not present

## 2017-11-29 DIAGNOSIS — E039 Hypothyroidism, unspecified: Secondary | ICD-10-CM | POA: Diagnosis not present

## 2017-11-29 DIAGNOSIS — Z452 Encounter for adjustment and management of vascular access device: Secondary | ICD-10-CM | POA: Diagnosis not present

## 2017-11-29 DIAGNOSIS — K56600 Partial intestinal obstruction, unspecified as to cause: Secondary | ICD-10-CM | POA: Diagnosis not present

## 2017-11-30 DIAGNOSIS — S301XXA Contusion of abdominal wall, initial encounter: Secondary | ICD-10-CM | POA: Diagnosis not present

## 2017-11-30 DIAGNOSIS — E2740 Unspecified adrenocortical insufficiency: Secondary | ICD-10-CM | POA: Diagnosis not present

## 2017-11-30 DIAGNOSIS — Z4689 Encounter for fitting and adjustment of other specified devices: Secondary | ICD-10-CM | POA: Diagnosis not present

## 2017-11-30 DIAGNOSIS — K5652 Intestinal adhesions [bands] with complete obstruction: Secondary | ICD-10-CM | POA: Diagnosis not present

## 2017-11-30 DIAGNOSIS — I5020 Unspecified systolic (congestive) heart failure: Secondary | ICD-10-CM | POA: Diagnosis not present

## 2017-11-30 DIAGNOSIS — Z4682 Encounter for fitting and adjustment of non-vascular catheter: Secondary | ICD-10-CM | POA: Diagnosis not present

## 2017-11-30 DIAGNOSIS — E039 Hypothyroidism, unspecified: Secondary | ICD-10-CM | POA: Diagnosis not present

## 2017-11-30 DIAGNOSIS — K56600 Partial intestinal obstruction, unspecified as to cause: Secondary | ICD-10-CM | POA: Diagnosis not present

## 2017-12-01 DIAGNOSIS — E2740 Unspecified adrenocortical insufficiency: Secondary | ICD-10-CM | POA: Diagnosis not present

## 2017-12-01 DIAGNOSIS — S301XXA Contusion of abdominal wall, initial encounter: Secondary | ICD-10-CM | POA: Diagnosis not present

## 2017-12-01 DIAGNOSIS — R109 Unspecified abdominal pain: Secondary | ICD-10-CM | POA: Diagnosis not present

## 2017-12-01 DIAGNOSIS — I5020 Unspecified systolic (congestive) heart failure: Secondary | ICD-10-CM | POA: Diagnosis not present

## 2017-12-01 DIAGNOSIS — E039 Hypothyroidism, unspecified: Secondary | ICD-10-CM | POA: Diagnosis not present

## 2017-12-01 DIAGNOSIS — Z4682 Encounter for fitting and adjustment of non-vascular catheter: Secondary | ICD-10-CM | POA: Diagnosis not present

## 2017-12-02 DIAGNOSIS — E039 Hypothyroidism, unspecified: Secondary | ICD-10-CM | POA: Diagnosis not present

## 2017-12-02 DIAGNOSIS — K567 Ileus, unspecified: Secondary | ICD-10-CM | POA: Diagnosis not present

## 2017-12-02 DIAGNOSIS — I5020 Unspecified systolic (congestive) heart failure: Secondary | ICD-10-CM | POA: Diagnosis not present

## 2017-12-02 DIAGNOSIS — E2740 Unspecified adrenocortical insufficiency: Secondary | ICD-10-CM | POA: Diagnosis not present

## 2017-12-02 DIAGNOSIS — S301XXA Contusion of abdominal wall, initial encounter: Secondary | ICD-10-CM | POA: Diagnosis not present

## 2017-12-03 DIAGNOSIS — I5020 Unspecified systolic (congestive) heart failure: Secondary | ICD-10-CM | POA: Diagnosis not present

## 2017-12-03 DIAGNOSIS — E039 Hypothyroidism, unspecified: Secondary | ICD-10-CM | POA: Diagnosis not present

## 2017-12-03 DIAGNOSIS — E2740 Unspecified adrenocortical insufficiency: Secondary | ICD-10-CM | POA: Diagnosis not present

## 2017-12-03 DIAGNOSIS — S301XXA Contusion of abdominal wall, initial encounter: Secondary | ICD-10-CM | POA: Diagnosis not present

## 2017-12-04 DIAGNOSIS — E2740 Unspecified adrenocortical insufficiency: Secondary | ICD-10-CM | POA: Diagnosis not present

## 2017-12-04 DIAGNOSIS — S301XXA Contusion of abdominal wall, initial encounter: Secondary | ICD-10-CM | POA: Diagnosis not present

## 2017-12-04 DIAGNOSIS — E039 Hypothyroidism, unspecified: Secondary | ICD-10-CM | POA: Diagnosis not present

## 2017-12-04 DIAGNOSIS — I5020 Unspecified systolic (congestive) heart failure: Secondary | ICD-10-CM | POA: Diagnosis not present

## 2017-12-05 DIAGNOSIS — K567 Ileus, unspecified: Secondary | ICD-10-CM | POA: Diagnosis not present

## 2017-12-05 DIAGNOSIS — E039 Hypothyroidism, unspecified: Secondary | ICD-10-CM | POA: Diagnosis not present

## 2017-12-05 DIAGNOSIS — E2740 Unspecified adrenocortical insufficiency: Secondary | ICD-10-CM | POA: Diagnosis not present

## 2017-12-05 DIAGNOSIS — S301XXA Contusion of abdominal wall, initial encounter: Secondary | ICD-10-CM | POA: Diagnosis not present

## 2017-12-05 DIAGNOSIS — I5020 Unspecified systolic (congestive) heart failure: Secondary | ICD-10-CM | POA: Diagnosis not present

## 2017-12-06 DIAGNOSIS — R140 Abdominal distension (gaseous): Secondary | ICD-10-CM | POA: Diagnosis not present

## 2017-12-06 DIAGNOSIS — I5020 Unspecified systolic (congestive) heart failure: Secondary | ICD-10-CM | POA: Diagnosis not present

## 2017-12-06 DIAGNOSIS — S301XXA Contusion of abdominal wall, initial encounter: Secondary | ICD-10-CM | POA: Diagnosis not present

## 2017-12-06 DIAGNOSIS — E039 Hypothyroidism, unspecified: Secondary | ICD-10-CM | POA: Diagnosis not present

## 2017-12-06 DIAGNOSIS — E2740 Unspecified adrenocortical insufficiency: Secondary | ICD-10-CM | POA: Diagnosis not present

## 2017-12-07 ENCOUNTER — Emergency Department (HOSPITAL_COMMUNITY)
Admission: EM | Admit: 2017-12-07 | Discharge: 2017-12-07 | Disposition: A | Discharge status: Home / Self Care | Payer: Federal, State, Local not specified - PPO | Attending: Emergency Medicine | Admitting: Emergency Medicine

## 2017-12-07 DIAGNOSIS — T148XXA Other injury of unspecified body region, initial encounter: Secondary | ICD-10-CM

## 2017-12-07 DIAGNOSIS — T8130XA Disruption of wound, unspecified, initial encounter: Secondary | ICD-10-CM | POA: Insufficient documentation

## 2017-12-07 DIAGNOSIS — Y69 Unspecified misadventure during surgical and medical care: Secondary | ICD-10-CM | POA: Insufficient documentation

## 2017-12-07 DIAGNOSIS — G8918 Other acute postprocedural pain: Secondary | ICD-10-CM | POA: Diagnosis not present

## 2017-12-07 DIAGNOSIS — S31109A Unspecified open wound of abdominal wall, unspecified quadrant without penetration into peritoneal cavity, initial encounter: Secondary | ICD-10-CM | POA: Diagnosis not present

## 2017-12-07 DIAGNOSIS — L24A9 Irritant contact dermatitis due friction or contact with other specified body fluids: Secondary | ICD-10-CM

## 2017-12-07 DIAGNOSIS — R109 Unspecified abdominal pain: Secondary | ICD-10-CM | POA: Diagnosis not present

## 2017-12-07 MED ORDER — OXYCODONE-ACETAMINOPHEN 5-325 MG PO TABS
1.0000 | ORAL_TABLET | Freq: Once | ORAL | Status: AC
  Administered 2017-12-07: 1 via ORAL
  Filled 2017-12-07: qty 1

## 2017-12-07 MED ORDER — OXYCODONE-ACETAMINOPHEN 5-325 MG PO TABS
2.0000 | ORAL_TABLET | ORAL | Status: DC | PRN
  Administered 2017-12-07: 2 via ORAL
  Filled 2017-12-07: qty 2

## 2017-12-07 NOTE — ED Notes (Signed)
Wound care and wet to dry dressing applied

## 2017-12-07 NOTE — ED Notes (Signed)
Pt pulled back to triage due to bleeding around wound vac site, wound vac appeared to have come out, no hemorrhage, abd pads placed over area. Pt complains of pain. PRN acute pain protocol placed.

## 2017-12-07 NOTE — ED Notes (Signed)
Patient verbalizes understanding of discharge instructions. Opportunity for questioning and answers were provided. Armband removed by staff, pt discharged from ED in wheelchair.  

## 2017-12-07 NOTE — Consult Note (Signed)
WOC consulted for "wound vac".  Contacted ED staff to clarify orders. Patient was sent home from [facility] per patient with "wound vac dressing on" and she was to get wound vac at home. No one ever came and hooked her up or brought any equipment to her home so she has had dressing with no VAC therapy since DC. She has come to the ED due to bleeding around site which would be related to not having the suction on the dressing certainly.   [place] nurse can not hook hospital NPWT unit to this patient, will need CM to arrange for home [facility] unit to be delivered to patient and arrange for [facility] to apply or change on prescribed regimen per [place] orders.  Bedside nurse will make ED MD aware.   Would suggest removal of NPWT dressing at this time if it has been in place greater than 2 hours without therapy and placement of saline moist dressing until situation for home therapy can be resolved. Manufacturer does not recommend suspension of therapy greater than 2 hours.  [place], [place], [place]

## 2017-12-07 NOTE — Care Management (Signed)
ED CM met with patient and husband at bedside. Patient reports leaving [facility] AMA yesterday.  Patient was on wound vac s/p surgical incison.  Patient left without being setup for a vac machine yesterday or receiving prescriptions.  CM explained that we would not be able to give her a wound vac, her surgeon would need to provide orders for tx of wound vac. CM encouraged patient to contact her Surgeon for transitional care plan and to schedule follow up appointment.  Patient verbalize understanding teach back done. Update Shawn Joy PA-C. 

## 2017-12-07 NOTE — Discharge Instructions (Signed)
Perform wet-to-dry dressing changes for your abdominal wound. Follow-up with your surgeon on this matter.  Return to the ED as needed.

## 2017-12-07 NOTE — ED Provider Notes (Signed)
[facility] EMERGENCY DEPARTMENT Provider Note   CSN: [id number] Arrival date & time: 12/07/17  0404     History   Chief Complaint Chief Complaint  Patient presents with  . Post-op Problem    HPI Crystal Yates is a 62 y.o. female.  HPI   Crystal Yates is a 62 y.o. female, with a history of COPD, CKD stage II, CAD, HTN, MI, hyperlipidemia, presenting to the ED with drainage from an abdominal wound. Patient had a bowel obstruction surgically resolved at [facility] by Dr. Lovie Macadamia September 5.  Patient was admitted in the hospital until yesterday when she left AMA due to unhappiness with her care.  Last night her wound VAC came off the wound and then the wound began to leak this morning.  She is passing gas and has had a bowel movement since the surgery.  She has abdominal soreness, but no increased pain.  Denies fever, nausea, vomiting, diarrhea, or any other complaints.   Past Medical History:  Diagnosis Date  . Acute on chronic systolic heart failure exacerbation(HCC) 04/08/2016  . CAD in native artery    a. Prior LAD stenting based on cath. b. RCA stenting 03/2016 x2.  . Chronic combined systolic and diastolic CHF (congestive heart failure) ([place])   . CKD (chronic kidney disease), stage II   . COPD (chronic obstructive pulmonary disease) ([place])   . Diabetes mellitus without complication ([place])   . Dyspnea   . Hashimoto's thyroiditis   . Hyperlipidemia   . Hypertension   . Myocardial infarction ([place])   . On home oxygen therapy    "2L; 24/7" (10/23/2017)  . Secondary adrenal insufficiency ([place])   . Thyroid disease   . Tobacco abuse     Patient Active Problem List   Diagnosis Date Noted  . Coronary stent restenosis due to progression of disease   . Volume overload   . Pseudoaneurysm following procedure ([place])   . Pseudoaneurysm ([place]) 10/17/2017  . S/P angioplasty for ISR of dRCA 10/13/17 10/14/2017  . CAD (coronary artery  disease) 10/13/2017  . Preop examination 10/10/2017  . Acute respiratory distress 09/16/2017  . Right upper lobe pneumonia ([place]) 05/06/2017  . Pneumonia due to respiratory syncytial virus (RSV) 04/21/2017  . Entrapment, radial nerve, right 03/31/2017  . COPD exacerbation ([place]) 10/14/2016  . Chronic combined systolic and diastolic CHF (congestive heart failure) ([place]) 10/13/2016  . Chest tightness or pressure 10/12/2016  . Dyspnea on exertion 09/29/2016  . History of coronary artery disease 09/29/2016  . OAB (overactive bladder) 09/21/2016  . Neck pain 09/08/2016  . Paresthesia of arm 09/08/2016  . Flank pain 08/31/2016  . Oxygen dependent 06/20/2016  . Neuralgia of right upper extremity 04/26/2016  . Coronary artery disease 04/16/2016  . Lymphocele after surgical procedure 04/16/2016  . Ventricular tachycardia (paroxysmal) ([place]) 04/09/2016  . Acute on chronic systolic heart failure exacerbation(HCC) 04/08/2016  . Ischemic cardiomyopathy 04/08/2016  . Type 2 diabetes mellitus without complication, with long-term current use of insulin ([place]) 04/08/2016  . Anemia, blood loss 09/16/2015  . GI bleeding 09/16/2015  . Chest pain 05/22/2015  . Hypokalemia 05/22/2015  . Hypothyroid 05/22/2015  . Status post hernia repair 01/29/2015  . Surgical wound breakdown 01/29/2015  . Hernia with strangulation 01/18/2015  . Tobacco abuse 01/18/2015  . Recurrent incisional hernia with incarceration 01/15/2015  . Sleep apnea 01/15/2015  . S/P drug eluting coronary stent placement 10/16/2014  . Adrenal insufficiency ([place]) 11/21/2013  . Anemia 11/21/2013  .  Unstable angina ([place]) 11/21/2013  . Atherosclerotic heart disease of native coronary artery without angina pectoris 11/21/2013  . Cardiomyopathy ([place]) 11/21/2013  . Congestive heart failure ([place]) 11/21/2013  . Diabetes mellitus with no complication ([place]) 46/50/3546  . Edema 11/21/2013  . Empty sella syndrome ([place]) 11/21/2013  . Hepatitis  11/21/2013  . Hyperlipemia 11/21/2013  . Hypertension 11/21/2013  . Kidney cyst, acquired 11/21/2013  . Lymph nodes enlarged 11/21/2013  . Fat necrosis 11/21/2013  . Pulmonary emphysema ([place]) 11/21/2013  . Renal disorder 11/21/2013  . Shortness of breath 11/21/2013  . Sjogrens syndrome ([place]) 11/21/2013  . Thyroid disorder 11/21/2013  . Nicotine dependence, uncomplicated 56/81/2751  . Major depressive disorder, recurrent episode ([place]) 09/17/2013  . Displacement of lumbar intervertebral disc 06/14/2013  . Intractable migraine without aura 06/14/2013  . Mild cognitive impairment 06/14/2013  . Polyneuropathy in diabetes ([place]) 06/14/2013  . Secondary adrenal insufficiency ([place]) 04/12/2011  . DDD (degenerative disc disease), lumbosacral 01/11/2008  . Thoracic or lumbosacral neuritis or radiculitis 01/11/2008    Past Surgical History:  Procedure Laterality Date  . ABDOMINAL SURGERY    . CESAREAN SECTION    . CHOLECYSTECTOMY    . COLON RESECTION    . CORONARY BALLOON ANGIOPLASTY N/A 10/13/2017   Procedure: CORONARY BALLOON ANGIOPLASTY;  Surgeon: Belva Crome, MD;  Location: [facility];  Service: Cardiovascular;  Laterality: N/A;  . HERNIA MESH REMOVAL    . HERNIA REPAIR    . LEFT HEART CATH AND CORONARY ANGIOGRAPHY N/A 10/13/2016   Procedure: Left Heart Cath and Coronary Angiography;  Surgeon: Nelva Bush, MD;  Location: [facility];  Service: Cardiovascular;  Laterality: N/A;  . RIGHT/LEFT HEART CATH AND CORONARY ANGIOGRAPHY N/A 10/13/2017   Procedure: RIGHT/LEFT HEART CATH AND CORONARY ANGIOGRAPHY;  Surgeon: Belva Crome, MD;  Location: [facility];  Service: Cardiovascular;  Laterality: N/A;  . SHOULDER ARTHROSCOPY    . TUBAL LIGATION       OB History   None      Home Medications    Prior to Admission medications   Medication Sig Start Date End Date Taking? Authorizing Provider  acetaminophen (TYLENOL) 325 MG tablet Take 2 tablets (650 mg  total) by mouth every 4 (four) hours as needed for headache or mild pain. 10/14/17  Yes Isaiah Serge, NP  albuterol (PROAIR HFA) 108 (90 Base) MCG/ACT inhaler Inhale 2 puffs into the lungs See admin instructions. Inhale 2 puffs into the lungs every 4-6 hours as needed for shortness of breath or wheezing 07/24/17  Yes [provider]  aspirin 81 MG chewable tablet Chew 1 tablet (81 mg total) by mouth daily. 10/14/17  Yes Isaiah Serge, NP  atorvastatin (LIPITOR) 80 MG tablet Take 1 tablet (80 mg total) by mouth daily at 6 PM. 10/14/17  Yes Isaiah Serge, NP  CALCIUM PO Take 1 tablet by mouth daily.   Yes [provider]  Cholecalciferol (VITAMIN D3) 2000 units capsule Take 2,000 Units by mouth daily.    Yes [provider]  clopidogrel (PLAVIX) 75 MG tablet Take 75 mg by mouth daily.   Yes [provider]  dexamethasone (DECADRON) 2 MG tablet Take 1 tablet (2 mg total) by mouth daily. 11/21/17  Yes Dorie Rank, MD  DULoxetine (CYMBALTA) 60 MG capsule Take 60 mg by mouth daily.  12/05/16  Yes [provider]  EMGALITY 120 MG/ML SOAJ Inject 120 mg into the skin every 30 (thirty) days.  07/30/17  Yes [provider]  fludrocortisone (FLORINEF) 0.1 MG tablet Take 1 tablet (0.1 mg total) by mouth 2 (two) times daily. 10/26/17  Yes Neva Seat, MD  hydrocortisone (CORTEF) 5 MG tablet Take 5-15 mg by mouth See admin instructions. Take 15 mg by mouth in the morning and 5 mg in the afternoon   Yes [provider]  Insulin Aspart, w/Niacinamide, (FIASP FLEXTOUCH) 100 UNIT/ML SOPN Inject 2-14 Units into the skin See admin instructions. Use three times day as needed before meals per sliding scale   Yes [provider]  insulin degludec (TRESIBA FLEXTOUCH) 100 UNIT/ML SOPN FlexTouch Pen Inject 40 Units into the skin at bedtime.  08/28/16  Yes [provider]  Iron-FA-B Cmp-C-Biot-Probiotic (FUSION PLUS) CAPS Take 1 capsule by mouth  daily. 10/09/16  Yes [provider]  levothyroxine (SYNTHROID, LEVOTHROID) 150 MCG tablet Take 150 mcg by mouth daily before breakfast.   Yes [provider]  Magnesium 250 MG TABS Take 250 mg by mouth daily.   Yes [provider]  metoprolol succinate (TOPROL-XL) 25 MG 24 hr tablet Take 0.5 tablets (12.5 mg total) by mouth daily. 10/27/17  Yes Ledell Noss, MD  nitroGLYCERIN (NITROSTAT) 0.4 MG SL tablet Place 0.4 mg under the tongue every 5 (five) minutes as needed for chest pain.    Yes [provider]  oxybutynin (DITROPAN XL) 15 MG 24 hr tablet Take 15 mg by mouth at bedtime.   Yes [provider]  potassium chloride SA (K-DUR,KLOR-CON) 20 MEQ tablet Take 1 tablet (20 mEq total) by mouth daily. 03/14/17  Yes Park Liter, MD  pramipexole (MIRAPEX) 1 MG tablet Take 1 mg by mouth 2 (two) times daily.   Yes [provider]  Semaglutide (OZEMPIC) 0.25 or 0.5 MG/DOSE SOPN Inject 0.5 mg into the skin every 30 (thirty) days.    Yes [provider]  torsemide (DEMADEX) 20 MG tablet Take 2 tablets (40 mg total) by mouth 2 (two) times daily. 10/21/17  Yes Alphonzo Grieve, MD  traMADol (ULTRAM) 50 MG tablet Take 1 tablet (50 mg total) by mouth every 6 (six) hours as needed. 11/21/17  Yes Dorie Rank, MD  TRELEGY ELLIPTA 100-62.5-25 MCG/INH AEPB Inhale 1 puff into the lungs daily. 08/30/17  Yes [provider]  TRULICITY 1.5 YS/0.6TK SOPN Inject 1.5 mg into the skin every Sunday.  10/10/16  Yes [provider]    Family History Family History  Problem Relation Age of Onset  . Stroke Mother   . Diabetes Father   . Diabetes Sister   . Diabetes Sister   . Diabetes Son     Social History Social History   Tobacco Use  . Smoking status: Current Every Day Smoker    Packs/day: 0.75    Years: 44.00    Pack years: 33.00    Types: Cigarettes  . Smokeless tobacco: Never Used  Substance Use Topics  . Alcohol use: Yes     Alcohol/week: 7.0 standard drinks    Types: 7 Shots of liquor per week  . Drug use: Yes    Types: Marijuana    Comment: 10/23/2017 "had 1 ~ 1 month ago; nothing before or since"     Allergies   Hydroxychloroquine; Donepezil; Prednisone; Anticoagulant cit dext [acd formula a]; Bupropion; Metrizamide; Varenicline; and Tape   Review of Systems Review of Systems  Constitutional: Negative for chills and fever.  Respiratory: Negative for shortness of breath.   Cardiovascular: Negative for chest  pain.  Gastrointestinal: Negative for abdominal pain, diarrhea, nausea and vomiting.  Genitourinary: Negative for dysuria, frequency and hematuria.  Skin: Positive for wound.  All other systems reviewed and are negative.    Physical Exam Updated Vital Signs BP (!) 145/80 (BP Location: Right Arm)   Pulse 91   Temp 98.1 F (36.7 C) (Oral)   Resp 18   SpO2 99%   Physical Exam  Constitutional: She appears well-developed and well-nourished. No distress.  HENT:  Head: Normocephalic and atraumatic.  Eyes: Conjunctivae are normal.  Neck: Neck supple.  Cardiovascular: Normal rate, regular rhythm, normal heart sounds and intact distal pulses.  Pulmonary/Chest: Effort normal and breath sounds normal. No respiratory distress.  Abdominal: Soft. There is no tenderness. There is no guarding.  Patient has a midline abdominal wound with piece of black foam in the wound, consistent with what one may find with a wound VAC.  Large Tegaderm type covering over this set up.  Wound edges appear clean, pink, and have good cap refill.  Drainage is serosanguineous in what appears to be an appropriate amount. Wound edges are appropriately tender.  Musculoskeletal: She exhibits no edema.  Lymphadenopathy:    She has no cervical adenopathy.  Neurological: She is alert.  Skin: Skin is warm and dry. She is not diaphoretic.  Psychiatric: She has a normal mood and affect. Her behavior is normal.  Nursing note and  vitals reviewed.    ED Treatments / Results  Labs (all labs ordered are listed, but only abnormal results are displayed) Labs Reviewed - No data to display  EKG None  Radiology No results found.  Procedures Procedures (including critical care time)  Medications Ordered in ED Medications  oxyCODONE-acetaminophen (PERCOCET/ROXICET) 5-325 MG per tablet 2 tablet (2 tablets Oral Given 12/07/17 0754)  oxyCODONE-acetaminophen (PERCOCET/ROXICET) 5-325 MG per tablet 1 tablet (1 tablet Oral Given 12/07/17 1258)  oxyCODONE-acetaminophen (PERCOCET/ROXICET) 5-325 MG per tablet 1 tablet (1 tablet Oral Given 12/07/17 1952)     Initial Impression / Assessment and Plan / ED Course  I have reviewed the triage vital signs and the nursing notes.  Pertinent labs & imaging results that were available during my care of the patient were reviewed by me and considered in my medical decision making (see chart for details).  Clinical Course as of Dec 07 2156  Thu Dec 07, 2017  Morgan with [facility] Gros, case manager. States she has confirmed patient does not have a wound VAC at home. She will work on what needs to happen next with getting the patient the equipment she needs.    [SJ]  6270 Spoke with Mariann Laster, Event organiser. States she interviewed patient and notes that since the [facility] started her care, they will need to be the ones who have to acquire the wound VAC and necessary supplies for the patient.    [SJ]    Clinical Course User Index [SJ] Sami Froh C, PA-C    Patient presents with drainage from an abdominal wound.  Wound appears clean and drainage appears to be appropriate. Patient is nontoxic appearing, afebrile, not tachycardic, not tachypneic, not hypotensive, and is in no apparent distress.  She was instructed on procedure for wet-to-dry dressings and was told to follow-up with her surgeon for further management of this issue.  Return precautions discussed.  Patient  voices understanding of these instructions, accepts the plan, and is comfortable with discharge.   Findings and plan of care discussed with Duffy Bruce, MD. Dr.  Isaacs personally evaluated and examined this patient.  Final Clinical Impressions(s) / ED Diagnoses   Final diagnoses:  Wound drainage    ED Discharge Orders    None       Layla Maw 12/07/17 2200    Duffy Bruce, MD 12/10/17 [phone]

## 2017-12-07 NOTE — ED Notes (Signed)
Case management at bedside.

## 2017-12-07 NOTE — ED Notes (Signed)
Pt provided UA--At [facility] Nurse's [facility].

## 2017-12-07 NOTE — ED Triage Notes (Signed)
See downtime note 

## 2017-12-08 DIAGNOSIS — Z4801 Encounter for change or removal of surgical wound dressing: Secondary | ICD-10-CM | POA: Diagnosis not present

## 2017-12-08 DIAGNOSIS — Z79899 Other long term (current) drug therapy: Secondary | ICD-10-CM | POA: Diagnosis not present

## 2017-12-08 DIAGNOSIS — I252 Old myocardial infarction: Secondary | ICD-10-CM | POA: Diagnosis not present

## 2017-12-08 DIAGNOSIS — I110 Hypertensive heart disease with heart failure: Secondary | ICD-10-CM | POA: Diagnosis not present

## 2017-12-08 DIAGNOSIS — E039 Hypothyroidism, unspecified: Secondary | ICD-10-CM | POA: Diagnosis not present

## 2017-12-08 DIAGNOSIS — Z794 Long term (current) use of insulin: Secondary | ICD-10-CM | POA: Diagnosis not present

## 2017-12-08 DIAGNOSIS — Z4800 Encounter for change or removal of nonsurgical wound dressing: Secondary | ICD-10-CM | POA: Diagnosis not present

## 2017-12-08 DIAGNOSIS — G8918 Other acute postprocedural pain: Secondary | ICD-10-CM | POA: Diagnosis not present

## 2017-12-08 DIAGNOSIS — Z9889 Other specified postprocedural states: Secondary | ICD-10-CM | POA: Diagnosis not present

## 2017-12-08 DIAGNOSIS — E7800 Pure hypercholesterolemia, unspecified: Secondary | ICD-10-CM | POA: Diagnosis not present

## 2017-12-08 DIAGNOSIS — I509 Heart failure, unspecified: Secondary | ICD-10-CM | POA: Diagnosis not present

## 2017-12-08 DIAGNOSIS — J449 Chronic obstructive pulmonary disease, unspecified: Secondary | ICD-10-CM | POA: Diagnosis not present

## 2017-12-08 DIAGNOSIS — E119 Type 2 diabetes mellitus without complications: Secondary | ICD-10-CM | POA: Diagnosis not present

## 2017-12-08 DIAGNOSIS — F17210 Nicotine dependence, cigarettes, uncomplicated: Secondary | ICD-10-CM | POA: Diagnosis not present

## 2017-12-08 DIAGNOSIS — R109 Unspecified abdominal pain: Secondary | ICD-10-CM | POA: Diagnosis not present

## 2017-12-10 ENCOUNTER — Encounter (HOSPITAL_COMMUNITY): Payer: Self-pay

## 2017-12-10 ENCOUNTER — Emergency Department (HOSPITAL_COMMUNITY)
Admission: EM | Admit: 2017-12-10 | Discharge: 2017-12-10 | Disposition: A | Discharge status: Home / Self Care | Payer: Federal, State, Local not specified - PPO | Attending: Emergency Medicine | Admitting: Emergency Medicine

## 2017-12-10 ENCOUNTER — Emergency Department (HOSPITAL_COMMUNITY): Payer: Federal, State, Local not specified - PPO

## 2017-12-10 ENCOUNTER — Other Ambulatory Visit: Payer: Self-pay

## 2017-12-10 DIAGNOSIS — F17210 Nicotine dependence, cigarettes, uncomplicated: Secondary | ICD-10-CM | POA: Diagnosis not present

## 2017-12-10 DIAGNOSIS — N182 Chronic kidney disease, stage 2 (mild): Secondary | ICD-10-CM | POA: Diagnosis not present

## 2017-12-10 DIAGNOSIS — E119 Type 2 diabetes mellitus without complications: Secondary | ICD-10-CM | POA: Insufficient documentation

## 2017-12-10 DIAGNOSIS — Z79899 Other long term (current) drug therapy: Secondary | ICD-10-CM | POA: Diagnosis not present

## 2017-12-10 DIAGNOSIS — I2510 Atherosclerotic heart disease of native coronary artery without angina pectoris: Secondary | ICD-10-CM | POA: Diagnosis not present

## 2017-12-10 DIAGNOSIS — I5042 Chronic combined systolic (congestive) and diastolic (congestive) heart failure: Secondary | ICD-10-CM | POA: Diagnosis not present

## 2017-12-10 DIAGNOSIS — R079 Chest pain, unspecified: Secondary | ICD-10-CM | POA: Diagnosis not present

## 2017-12-10 DIAGNOSIS — I130 Hypertensive heart and chronic kidney disease with heart failure and stage 1 through stage 4 chronic kidney disease, or unspecified chronic kidney disease: Secondary | ICD-10-CM | POA: Diagnosis not present

## 2017-12-10 DIAGNOSIS — J449 Chronic obstructive pulmonary disease, unspecified: Secondary | ICD-10-CM | POA: Insufficient documentation

## 2017-12-10 DIAGNOSIS — R0902 Hypoxemia: Secondary | ICD-10-CM | POA: Diagnosis not present

## 2017-12-10 DIAGNOSIS — Z7982 Long term (current) use of aspirin: Secondary | ICD-10-CM | POA: Diagnosis not present

## 2017-12-10 DIAGNOSIS — E039 Hypothyroidism, unspecified: Secondary | ICD-10-CM | POA: Insufficient documentation

## 2017-12-10 DIAGNOSIS — R0602 Shortness of breath: Secondary | ICD-10-CM | POA: Diagnosis not present

## 2017-12-10 DIAGNOSIS — R0789 Other chest pain: Secondary | ICD-10-CM | POA: Diagnosis not present

## 2017-12-10 DIAGNOSIS — R600 Localized edema: Secondary | ICD-10-CM | POA: Diagnosis not present

## 2017-12-10 DIAGNOSIS — R000 Tachycardia, unspecified: Secondary | ICD-10-CM | POA: Diagnosis not present

## 2017-12-10 LAB — BASIC METABOLIC PANEL
Anion gap: 11 (ref 5–15)
BUN: 12 mg/dL (ref 8–23)
CALCIUM: 8.1 mg/dL — AB (ref 8.9–10.3)
CHLORIDE: 100 mmol/L (ref 98–111)
CO2: 26 mmol/L (ref 22–32)
CREATININE: 1.31 mg/dL — AB (ref 0.44–1.00)
GFR, EST AFRICAN AMERICAN: 49 mL/min — AB (ref >60)
GFR, EST NON AFRICAN AMERICAN: 43 mL/min — AB (ref >60)
Glucose, Bld: 151 mg/dL — ABNORMAL HIGH (ref 70–99)
Potassium: 3.7 mmol/L (ref 3.5–5.1)
SODIUM: 137 mmol/L (ref 135–145)

## 2017-12-10 LAB — URINALYSIS, ROUTINE W REFLEX MICROSCOPIC
Bilirubin Urine: NEGATIVE
GLUCOSE, UA: NEGATIVE mg/dL
Hgb urine dipstick: NEGATIVE
Ketones, ur: NEGATIVE mg/dL
NITRITE: NEGATIVE
PH: 5 (ref 5.0–8.0)
Protein, ur: NEGATIVE mg/dL
SPECIFIC GRAVITY, URINE: 1.011 (ref 1.005–1.030)

## 2017-12-10 LAB — CBC
HCT: 42.3 % (ref 36.0–46.0)
Hemoglobin: 13 g/dL (ref 12.0–15.0)
MCH: 28.6 pg (ref 26.0–34.0)
MCHC: 30.7 g/dL (ref 30.0–36.0)
MCV: 93.2 fL (ref 78.0–100.0)
PLATELETS: 289 10*3/uL (ref 150–400)
RBC: 4.54 MIL/uL (ref 3.87–5.11)
RDW: 15 % (ref 11.5–15.5)
WBC: 14 10*3/uL — AB (ref 4.0–10.5)

## 2017-12-10 LAB — I-STAT TROPONIN, ED: TROPONIN I, POC: 0.01 ng/mL (ref 0.00–0.08)

## 2017-12-10 LAB — LIPASE, BLOOD: LIPASE: 35 U/L (ref 11–51)

## 2017-12-10 MED ORDER — ONDANSETRON 4 MG PO TBDP: 4.0000 mg | ORAL_TABLET | Freq: Three times a day (TID) | ORAL | 0 refills | Status: DC | PRN

## 2017-12-10 MED ORDER — FUROSEMIDE 10 MG/ML IJ SOLN
40.0000 mg | Freq: Once | INTRAMUSCULAR | Status: AC
  Administered 2017-12-10: 40 mg via INTRAVENOUS
  Filled 2017-12-10: qty 4

## 2017-12-10 MED ORDER — ONDANSETRON 4 MG PO TBDP
8.0000 mg | ORAL_TABLET | Freq: Once | ORAL | Status: AC
  Administered 2017-12-10: 8 mg via ORAL
  Filled 2017-12-10: qty 2

## 2017-12-10 NOTE — ED Notes (Signed)
PT states understanding of care given, follow up care, and medication prescribed. PT ambulated from ED to car with a steady gait. 

## 2017-12-10 NOTE — ED Triage Notes (Signed)
Pt BIB [facility] for eval of chest pain onset this AM and abd pain. Pt was recently discharged after abs surgery for a small bowel obstruction. Pt reports 10/10 abd pain which she reports is unchanged sin surgery. Endorses worsening lower extremity edema which she reports is consistent with her hx of heart failure. Pt also reports 6/10 chest pain and shortness of breath.

## 2017-12-10 NOTE — ED Provider Notes (Addendum)
[facility] EMERGENCY DEPARTMENT Provider Note   CSN: [id number] Arrival date & time: 12/10/17  1309     History   Chief Complaint Chief Complaint  Patient presents with  . Chest Pain  . Abdominal Pain    HPI Crystal Yates is a 62 y.o. female.  The history is provided by the patient. No language interpreter was used.  Abdominal Pain    Leg Pain   This is a chronic problem. The current episode started more than 2 days ago. The problem occurs constantly. The problem has been gradually worsening. The pain is present in the right lower leg and left lower leg. The pain is moderate. Pertinent negatives include no numbness. She has tried nothing for the symptoms. The treatment provided no relief. There has been no history of extremity trauma.   Pt complains of edema in both lower legs.  Pt reports she was recently in the hospital  Past Medical History:  Diagnosis Date  . Acute on chronic systolic heart failure exacerbation(HCC) 04/08/2016  . CAD in native artery    a. Prior LAD stenting based on cath. b. RCA stenting 03/2016 x2.  . Chronic combined systolic and diastolic CHF (congestive heart failure) ([place])   . CKD (chronic kidney disease), stage II   . COPD (chronic obstructive pulmonary disease) ([place])   . Diabetes mellitus without complication ([place])   . Dyspnea   . Hashimoto's thyroiditis   . Hyperlipidemia   . Hypertension   . Myocardial infarction ([place])   . On home oxygen therapy    "2L; 24/7" (10/23/2017)  . Secondary adrenal insufficiency ([place])   . Thyroid disease   . Tobacco abuse     Patient Active Problem List   Diagnosis Date Noted  . Coronary stent restenosis due to progression of disease   . Volume overload   . Pseudoaneurysm following procedure ([place])   . Pseudoaneurysm ([place]) 10/17/2017  . S/P angioplasty for ISR of dRCA 10/13/17 10/14/2017  . CAD (coronary artery disease) 10/13/2017  . Preop examination 10/10/2017  . Acute  respiratory distress 09/16/2017  . Right upper lobe pneumonia ([place]) 05/06/2017  . Pneumonia due to respiratory syncytial virus (RSV) 04/21/2017  . Entrapment, radial nerve, right 03/31/2017  . COPD exacerbation ([place]) 10/14/2016  . Chronic combined systolic and diastolic CHF (congestive heart failure) ([place]) 10/13/2016  . Chest tightness or pressure 10/12/2016  . Dyspnea on exertion 09/29/2016  . History of coronary artery disease 09/29/2016  . OAB (overactive bladder) 09/21/2016  . Neck pain 09/08/2016  . Paresthesia of arm 09/08/2016  . Flank pain 08/31/2016  . Oxygen dependent 06/20/2016  . Neuralgia of right upper extremity 04/26/2016  . Coronary artery disease 04/16/2016  . Lymphocele after surgical procedure 04/16/2016  . Ventricular tachycardia (paroxysmal) ([place]) 04/09/2016  . Acute on chronic systolic heart failure exacerbation(HCC) 04/08/2016  . Ischemic cardiomyopathy 04/08/2016  . Type 2 diabetes mellitus without complication, with long-term current use of insulin ([place]) 04/08/2016  . Anemia, blood loss 09/16/2015  . GI bleeding 09/16/2015  . Chest pain 05/22/2015  . Hypokalemia 05/22/2015  . Hypothyroid 05/22/2015  . Status post hernia repair 01/29/2015  . Surgical wound breakdown 01/29/2015  . Hernia with strangulation 01/18/2015  . Tobacco abuse 01/18/2015  . Recurrent incisional hernia with incarceration 01/15/2015  . Sleep apnea 01/15/2015  . S/P drug eluting coronary stent placement 10/16/2014  . Adrenal insufficiency ([place]) 11/21/2013  . Anemia 11/21/2013  . Unstable angina ([place]) 11/21/2013  . Atherosclerotic  heart disease of native coronary artery without angina pectoris 11/21/2013  . Cardiomyopathy ([place]) 11/21/2013  . Congestive heart failure ([place]) 11/21/2013  . Diabetes mellitus with no complication ([place]) 82/95/6213  . Edema 11/21/2013  . Empty sella syndrome ([place]) 11/21/2013  . Hepatitis 11/21/2013  . Hyperlipemia 11/21/2013  . Hypertension 11/21/2013  .  Kidney cyst, acquired 11/21/2013  . Lymph nodes enlarged 11/21/2013  . Fat necrosis 11/21/2013  . Pulmonary emphysema ([place]) 11/21/2013  . Renal disorder 11/21/2013  . Shortness of breath 11/21/2013  . Sjogrens syndrome ([place]) 11/21/2013  . Thyroid disorder 11/21/2013  . Nicotine dependence, uncomplicated 08/65/7846  . Major depressive disorder, recurrent episode ([place]) 09/17/2013  . Displacement of lumbar intervertebral disc 06/14/2013  . Intractable migraine without aura 06/14/2013  . Mild cognitive impairment 06/14/2013  . Polyneuropathy in diabetes ([place]) 06/14/2013  . Secondary adrenal insufficiency ([place]) 04/12/2011  . DDD (degenerative disc disease), lumbosacral 01/11/2008  . Thoracic or lumbosacral neuritis or radiculitis 01/11/2008    Past Surgical History:  Procedure Laterality Date  . ABDOMINAL SURGERY    . CESAREAN SECTION    . CHOLECYSTECTOMY    . COLON RESECTION    . CORONARY BALLOON ANGIOPLASTY N/A 10/13/2017   Procedure: CORONARY BALLOON ANGIOPLASTY;  Surgeon: Belva Crome, MD;  Location: [facility];  Service: Cardiovascular;  Laterality: N/A;  . HERNIA MESH REMOVAL    . HERNIA REPAIR    . LEFT HEART CATH AND CORONARY ANGIOGRAPHY N/A 10/13/2016   Procedure: Left Heart Cath and Coronary Angiography;  Surgeon: Nelva Bush, MD;  Location: [facility];  Service: Cardiovascular;  Laterality: N/A;  . RIGHT/LEFT HEART CATH AND CORONARY ANGIOGRAPHY N/A 10/13/2017   Procedure: RIGHT/LEFT HEART CATH AND CORONARY ANGIOGRAPHY;  Surgeon: Belva Crome, MD;  Location: [facility];  Service: Cardiovascular;  Laterality: N/A;  . SHOULDER ARTHROSCOPY    . TUBAL LIGATION       OB History   None      Home Medications    Prior to Admission medications   Medication Sig Start Date End Date Taking? Authorizing Provider  acetaminophen (TYLENOL) 325 MG tablet Take 2 tablets (650 mg total) by mouth every 4 (four) hours as needed for headache or mild pain.  10/14/17  Yes Isaiah Serge, NP  albuterol (PROAIR HFA) 108 (90 Base) MCG/ACT inhaler Inhale 2 puffs into the lungs See admin instructions. Inhale 2 puffs into the lungs every 4-6 hours as needed for shortness of breath or wheezing 07/24/17  Yes [provider]  aspirin 81 MG chewable tablet Chew 1 tablet (81 mg total) by mouth daily. 10/14/17  Yes Isaiah Serge, NP  atorvastatin (LIPITOR) 80 MG tablet Take 1 tablet (80 mg total) by mouth daily at 6 PM. 10/14/17  Yes Isaiah Serge, NP  CALCIUM PO Take 1 tablet by mouth daily.   Yes [provider]  Cholecalciferol (VITAMIN D3) 2000 units capsule Take 2,000 Units by mouth daily.    Yes [provider]  clopidogrel (PLAVIX) 75 MG tablet Take 75 mg by mouth daily.   Yes [provider]  dexamethasone (DECADRON) 2 MG tablet Take 1 tablet (2 mg total) by mouth daily. 11/21/17  Yes Dorie Rank, MD  DULoxetine (CYMBALTA) 60 MG capsule Take 60 mg by mouth daily.  12/05/16  Yes [provider]  EMGALITY 120 MG/ML SOAJ Inject 120 mg into the skin every 30 (thirty) days.  07/30/17  Yes [provider]  fludrocortisone (FLORINEF) 0.1 MG tablet Take 1 tablet (0.1 mg total) by mouth 2 (two) times daily. 10/26/17  Yes Neva Seat, MD  hydrocortisone (CORTEF) 5 MG tablet Take 5-15 mg by mouth See admin instructions. Take 15 mg by mouth in the morning and 5 mg in the afternoon   Yes [provider]  Insulin Aspart, w/Niacinamide, (FIASP FLEXTOUCH) 100 UNIT/ML SOPN Inject 2-14 Units into the skin See admin instructions. Use three times day as needed before meals per sliding scale   Yes [provider]  insulin degludec (TRESIBA FLEXTOUCH) 100 UNIT/ML SOPN FlexTouch Pen Inject 40 Units into the skin at bedtime.  08/28/16  Yes [provider]  Iron-FA-B Cmp-C-Biot-Probiotic (FUSION PLUS) CAPS Take 1 capsule by mouth daily. 10/09/16  Yes [provider]  levothyroxine (SYNTHROID,  LEVOTHROID) 150 MCG tablet Take 150 mcg by mouth daily before breakfast.   Yes [provider]  Magnesium 250 MG TABS Take 250 mg by mouth daily.   Yes [provider]  metoprolol succinate (TOPROL-XL) 25 MG 24 hr tablet Take 0.5 tablets (12.5 mg total) by mouth daily. 10/27/17  Yes Ledell Noss, MD  nitroGLYCERIN (NITROSTAT) 0.4 MG SL tablet Place 0.4 mg under the tongue every 5 (five) minutes as needed for chest pain.    Yes [provider]  oxybutynin (DITROPAN XL) 15 MG 24 hr tablet Take 15 mg by mouth at bedtime.   Yes [provider]  oxyCODONE (OXY IR/ROXICODONE) 5 MG immediate release tablet Take 5 mg by mouth every 4 (four) hours as needed for pain. 12/08/17  Yes [provider]  potassium chloride SA (K-DUR,KLOR-CON) 20 MEQ tablet Take 1 tablet (20 mEq total) by mouth daily. 03/14/17  Yes Park Liter, MD  pramipexole (MIRAPEX) 1 MG tablet Take 1 mg by mouth 2 (two) times daily.   Yes [provider]  Semaglutide (OZEMPIC) 0.25 or 0.5 MG/DOSE SOPN Inject 0.5 mg into the skin every 30 (thirty) days.    Yes [provider]  torsemide (DEMADEX) 20 MG tablet Take 2 tablets (40 mg total) by mouth 2 (two) times daily. 10/21/17  Yes Alphonzo Grieve, MD  TRELEGY ELLIPTA 100-62.5-25 MCG/INH AEPB Inhale 1 puff into the lungs daily. 08/30/17  Yes [provider]  TRULICITY 1.5 [place]/7.0WC SOPN Inject 1.5 mg into the skin every Sunday.  10/10/16  Yes [provider]  traMADol (ULTRAM) 50 MG tablet Take 1 tablet (50 mg total) by mouth every 6 (six) hours as needed. Patient not taking: Reported on 12/10/2017 11/21/17   Dorie Rank, MD    Family History Family History  Problem Relation Age of Onset  . Stroke Mother   . Diabetes Father   . Diabetes Sister   . Diabetes Sister   . Diabetes Son     Social History Social History   Tobacco Use  . Smoking status: Current Every Day Smoker    Packs/day: 0.75    Years: 44.00     Pack years: 33.00    Types: Cigarettes  . Smokeless tobacco: Never Used  Substance Use Topics  . Alcohol use: Yes    Alcohol/week: 7.0 standard drinks    Types: 7 Shots of liquor per week  . Drug use: Yes    Types: Marijuana    Comment: 10/23/2017 "had 1 ~ 1 month ago; nothing before or since"     Allergies   Hydroxychloroquine; Donepezil; Prednisone; Anticoagulant cit dext [acd formula a]; Bupropion; Metrizamide; Varenicline; and Tape  Review of Systems Review of Systems  Neurological: Negative for numbness.  All other systems reviewed and are negative.    Physical Exam Updated Vital Signs BP (!) 101/56   Pulse 88   Temp 98 F (36.7 C) (Oral)   Resp 17   Ht 5\' 2"  (1.575 m)   Wt 102.5 kg   SpO2 100%   BMI 41.34 kg/m   Physical Exam  Constitutional: She appears well-developed and well-nourished.  HENT:  Head: Normocephalic.  Eyes: Pupils are equal, round, and reactive to light.  Neck: Normal range of motion.  Cardiovascular: Normal rate, regular rhythm and normal pulses.  Pulmonary/Chest: Effort normal.  Abdominal: Soft.  Dressing saturated   Musculoskeletal: Normal range of motion.       Right lower leg: She exhibits edema.       Left lower leg: She exhibits edema.  Neurological: She is alert.  Skin: Skin is warm.  Nursing note reviewed.    ED Treatments / Results  Labs (all labs ordered are listed, but only abnormal results are displayed) Labs Reviewed  BASIC METABOLIC PANEL - Abnormal; Notable for the following components:      Result Value   Glucose, Bld 151 (*)    Creatinine, Ser 1.31 (*)    Calcium 8.1 (*)    GFR calc non Af Amer 43 (*)    GFR calc Af Amer 49 (*)    All other components within normal limits  CBC - Abnormal; Notable for the following components:   WBC 14.0 (*)    All other components within normal limits  URINALYSIS, ROUTINE W REFLEX MICROSCOPIC - Abnormal; Notable for the following components:   APPearance HAZY (*)     Leukocytes, UA TRACE (*)    Bacteria, UA FEW (*)    All other components within normal limits  LIPASE, BLOOD  I-STAT TROPONIN, ED    EKG EKG Interpretation  Date/Time:  Sunday December 10 2017 13:20:04 EDT Ventricular Rate:  101 PR Interval:  120 QRS Duration: 96 QT Interval:  404 QTC Calculation: 523 R Axis:   45 Text Interpretation:  Sinus tachycardia Right atrial enlargement Non-specific ST-t changes Confirmed by Lajean Saver [phone]) on 12/10/2017 4:10:16 PM   Radiology Dg Chest 2 View  Result Date: 12/10/2017 CLINICAL DATA:  Chest pain and shortness of breath EXAM: CHEST - 2 VIEW COMPARISON:  12/01/2017 FINDINGS: Endotracheal tube and nasogastric catheter have been removed in the interval. Right jugular catheter has been removed as well. Postsurgical changes in the cervical spine are noted. Cardiac shadow is within normal limits. Aortic calcifications are again seen. The lungs are well aerated bilaterally. Minimal blunting of the left costophrenic angle is noted stable from the prior study. No new focal infiltrate or effusion is seen. IMPRESSION: Minimal left basilar atelectasis. Electronically Signed   By: Inez Catalina M.D.   On: 12/10/2017 14:06    Procedures Procedures (including critical care time)  Medications Ordered in ED Medications  furosemide (LASIX) injection 40 mg (40 mg Intravenous Given 12/10/17 1632)  ondansetron (ZOFRAN-ODT) disintegrating tablet 8 mg (8 mg Oral Given 12/10/17 1813)     Initial Impression / Assessment and Plan / ED Course  I have reviewed the triage vital signs and the nursing notes.  Pertinent labs & imaging results that were available during my care of the patient were reviewed by me and considered in my medical decision making (see chart for details).  Clinical Course as of Dec 10 1825  Sun Dec 10, 2017  1594 Basic metabolic panel(!) [LS]    Clinical Course User Index [LS] Fransico Meadow, PA-C    MDM  Pt given Iv lasix.   Pt  urinated several times.   Pt reexamined,  Pt has decreased swelling.  Pt advised of need to continue her lasix and elevate her legs.   Dressing changed on abdominal wound    Final Clinical Impressions(s) / ED Diagnoses   Final diagnoses:  Lower extremity edema   An After Visit Summary was printed and given to the patient.  ED Discharge Orders    None       Sidney Ace 12/10/17 1850    Sidney Ace 12/10/17 1859    Lajean Saver, MD 12/10/17 2224

## 2017-12-10 NOTE — Discharge Instructions (Addendum)
Elevate your legs.  Follow up with your surgeon for abdominal wound.  Take your fluid pill as scheduled

## 2017-12-11 DIAGNOSIS — T8189XA Other complications of procedures, not elsewhere classified, initial encounter: Secondary | ICD-10-CM | POA: Diagnosis not present

## 2017-12-11 LAB — BASIC METABOLIC PANEL
ANION GAP: 8 (ref 5–15)
BUN: 11 mg/dL (ref 8–23)
CO2: 25 mmol/L (ref 22–32)
Calcium: 9 mg/dL (ref 8.9–10.3)
Chloride: 110 mmol/L (ref 98–111)
Creatinine, Ser: 1.16 mg/dL — ABNORMAL HIGH (ref 0.44–1.00)
GLUCOSE: 115 mg/dL — AB (ref 70–99)
POTASSIUM: 4.2 mmol/L (ref 3.5–5.1)
Sodium: 143 mmol/L (ref 135–145)

## 2017-12-11 LAB — CBC
HEMATOCRIT: 40.7 % (ref 36.0–46.0)
HEMOGLOBIN: 12.6 g/dL (ref 12.0–15.0)
MCH: 29.2 pg (ref 26.0–34.0)
MCHC: 31 g/dL (ref 30.0–36.0)
MCV: 94.4 fL (ref 78.0–100.0)
Platelets: 259 10*3/uL (ref 150–400)
RBC: 4.31 MIL/uL (ref 3.87–5.11)
RDW: 14.4 % (ref 11.5–15.5)
WBC: 12.2 10*3/uL — ABNORMAL HIGH (ref 4.0–10.5)

## 2017-12-11 LAB — LIPASE, BLOOD: Lipase: 93 U/L — ABNORMAL HIGH (ref 11–51)

## 2017-12-12 DIAGNOSIS — M3500 Sicca syndrome, unspecified: Secondary | ICD-10-CM | POA: Diagnosis not present

## 2017-12-12 DIAGNOSIS — J449 Chronic obstructive pulmonary disease, unspecified: Secondary | ICD-10-CM | POA: Diagnosis not present

## 2017-12-12 DIAGNOSIS — G43019 Migraine without aura, intractable, without status migrainosus: Secondary | ICD-10-CM | POA: Diagnosis not present

## 2017-12-12 DIAGNOSIS — N3941 Urge incontinence: Secondary | ICD-10-CM | POA: Diagnosis not present

## 2017-12-12 DIAGNOSIS — I5023 Acute on chronic systolic (congestive) heart failure: Secondary | ICD-10-CM | POA: Diagnosis not present

## 2017-12-12 DIAGNOSIS — S301XXD Contusion of abdominal wall, subsequent encounter: Secondary | ICD-10-CM | POA: Diagnosis not present

## 2017-12-12 DIAGNOSIS — I472 Ventricular tachycardia: Secondary | ICD-10-CM | POA: Diagnosis not present

## 2017-12-12 DIAGNOSIS — G2581 Restless legs syndrome: Secondary | ICD-10-CM | POA: Diagnosis not present

## 2017-12-12 DIAGNOSIS — R1900 Intra-abdominal and pelvic swelling, mass and lump, unspecified site: Secondary | ICD-10-CM | POA: Diagnosis not present

## 2017-12-12 DIAGNOSIS — I200 Unstable angina: Secondary | ICD-10-CM | POA: Diagnosis not present

## 2017-12-12 DIAGNOSIS — Z48815 Encounter for surgical aftercare following surgery on the digestive system: Secondary | ICD-10-CM | POA: Diagnosis not present

## 2017-12-12 DIAGNOSIS — I255 Ischemic cardiomyopathy: Secondary | ICD-10-CM | POA: Diagnosis not present

## 2017-12-12 DIAGNOSIS — G4733 Obstructive sleep apnea (adult) (pediatric): Secondary | ICD-10-CM | POA: Diagnosis not present

## 2017-12-12 DIAGNOSIS — I10 Essential (primary) hypertension: Secondary | ICD-10-CM | POA: Diagnosis not present

## 2017-12-12 DIAGNOSIS — I2510 Atherosclerotic heart disease of native coronary artery without angina pectoris: Secondary | ICD-10-CM | POA: Diagnosis not present

## 2017-12-12 DIAGNOSIS — F339 Major depressive disorder, recurrent, unspecified: Secondary | ICD-10-CM | POA: Diagnosis not present

## 2017-12-14 DIAGNOSIS — I255 Ischemic cardiomyopathy: Secondary | ICD-10-CM | POA: Diagnosis not present

## 2017-12-14 DIAGNOSIS — I5023 Acute on chronic systolic (congestive) heart failure: Secondary | ICD-10-CM | POA: Diagnosis not present

## 2017-12-14 DIAGNOSIS — G43019 Migraine without aura, intractable, without status migrainosus: Secondary | ICD-10-CM | POA: Diagnosis not present

## 2017-12-14 DIAGNOSIS — I2510 Atherosclerotic heart disease of native coronary artery without angina pectoris: Secondary | ICD-10-CM | POA: Diagnosis not present

## 2017-12-14 DIAGNOSIS — G4733 Obstructive sleep apnea (adult) (pediatric): Secondary | ICD-10-CM | POA: Diagnosis not present

## 2017-12-14 DIAGNOSIS — N3941 Urge incontinence: Secondary | ICD-10-CM | POA: Diagnosis not present

## 2017-12-14 DIAGNOSIS — F339 Major depressive disorder, recurrent, unspecified: Secondary | ICD-10-CM | POA: Diagnosis not present

## 2017-12-14 DIAGNOSIS — I10 Essential (primary) hypertension: Secondary | ICD-10-CM | POA: Diagnosis not present

## 2017-12-14 DIAGNOSIS — Z48815 Encounter for surgical aftercare following surgery on the digestive system: Secondary | ICD-10-CM | POA: Diagnosis not present

## 2017-12-14 DIAGNOSIS — M3500 Sicca syndrome, unspecified: Secondary | ICD-10-CM | POA: Diagnosis not present

## 2017-12-14 DIAGNOSIS — I472 Ventricular tachycardia: Secondary | ICD-10-CM | POA: Diagnosis not present

## 2017-12-14 DIAGNOSIS — S301XXD Contusion of abdominal wall, subsequent encounter: Secondary | ICD-10-CM | POA: Diagnosis not present

## 2017-12-14 DIAGNOSIS — I200 Unstable angina: Secondary | ICD-10-CM | POA: Diagnosis not present

## 2017-12-14 DIAGNOSIS — J449 Chronic obstructive pulmonary disease, unspecified: Secondary | ICD-10-CM | POA: Diagnosis not present

## 2017-12-14 DIAGNOSIS — G2581 Restless legs syndrome: Secondary | ICD-10-CM | POA: Diagnosis not present

## 2017-12-14 DIAGNOSIS — R1900 Intra-abdominal and pelvic swelling, mass and lump, unspecified site: Secondary | ICD-10-CM | POA: Diagnosis not present

## 2017-12-16 DIAGNOSIS — G2581 Restless legs syndrome: Secondary | ICD-10-CM | POA: Diagnosis not present

## 2017-12-16 DIAGNOSIS — Z48815 Encounter for surgical aftercare following surgery on the digestive system: Secondary | ICD-10-CM | POA: Diagnosis not present

## 2017-12-16 DIAGNOSIS — G4733 Obstructive sleep apnea (adult) (pediatric): Secondary | ICD-10-CM | POA: Diagnosis not present

## 2017-12-16 DIAGNOSIS — F339 Major depressive disorder, recurrent, unspecified: Secondary | ICD-10-CM | POA: Diagnosis not present

## 2017-12-16 DIAGNOSIS — I255 Ischemic cardiomyopathy: Secondary | ICD-10-CM | POA: Diagnosis not present

## 2017-12-16 DIAGNOSIS — N3941 Urge incontinence: Secondary | ICD-10-CM | POA: Diagnosis not present

## 2017-12-16 DIAGNOSIS — J449 Chronic obstructive pulmonary disease, unspecified: Secondary | ICD-10-CM | POA: Diagnosis not present

## 2017-12-16 DIAGNOSIS — I200 Unstable angina: Secondary | ICD-10-CM | POA: Diagnosis not present

## 2017-12-16 DIAGNOSIS — R1900 Intra-abdominal and pelvic swelling, mass and lump, unspecified site: Secondary | ICD-10-CM | POA: Diagnosis not present

## 2017-12-16 DIAGNOSIS — I472 Ventricular tachycardia: Secondary | ICD-10-CM | POA: Diagnosis not present

## 2017-12-16 DIAGNOSIS — S301XXD Contusion of abdominal wall, subsequent encounter: Secondary | ICD-10-CM | POA: Diagnosis not present

## 2017-12-16 DIAGNOSIS — G43019 Migraine without aura, intractable, without status migrainosus: Secondary | ICD-10-CM | POA: Diagnosis not present

## 2017-12-16 DIAGNOSIS — I5023 Acute on chronic systolic (congestive) heart failure: Secondary | ICD-10-CM | POA: Diagnosis not present

## 2017-12-16 DIAGNOSIS — I10 Essential (primary) hypertension: Secondary | ICD-10-CM | POA: Diagnosis not present

## 2017-12-16 DIAGNOSIS — I2510 Atherosclerotic heart disease of native coronary artery without angina pectoris: Secondary | ICD-10-CM | POA: Diagnosis not present

## 2017-12-16 DIAGNOSIS — M3500 Sicca syndrome, unspecified: Secondary | ICD-10-CM | POA: Diagnosis not present

## 2017-12-18 DIAGNOSIS — I472 Ventricular tachycardia: Secondary | ICD-10-CM | POA: Diagnosis not present

## 2017-12-18 DIAGNOSIS — G4733 Obstructive sleep apnea (adult) (pediatric): Secondary | ICD-10-CM | POA: Diagnosis not present

## 2017-12-18 DIAGNOSIS — Z09 Encounter for follow-up examination after completed treatment for conditions other than malignant neoplasm: Secondary | ICD-10-CM | POA: Diagnosis not present

## 2017-12-18 DIAGNOSIS — Z23 Encounter for immunization: Secondary | ICD-10-CM | POA: Diagnosis not present

## 2017-12-18 DIAGNOSIS — I200 Unstable angina: Secondary | ICD-10-CM | POA: Diagnosis not present

## 2017-12-18 DIAGNOSIS — G2581 Restless legs syndrome: Secondary | ICD-10-CM | POA: Diagnosis not present

## 2017-12-18 DIAGNOSIS — N3941 Urge incontinence: Secondary | ICD-10-CM | POA: Diagnosis not present

## 2017-12-18 DIAGNOSIS — S301XXD Contusion of abdominal wall, subsequent encounter: Secondary | ICD-10-CM | POA: Diagnosis not present

## 2017-12-18 DIAGNOSIS — I10 Essential (primary) hypertension: Secondary | ICD-10-CM | POA: Diagnosis not present

## 2017-12-18 DIAGNOSIS — F339 Major depressive disorder, recurrent, unspecified: Secondary | ICD-10-CM | POA: Diagnosis not present

## 2017-12-18 DIAGNOSIS — I255 Ischemic cardiomyopathy: Secondary | ICD-10-CM | POA: Diagnosis not present

## 2017-12-18 DIAGNOSIS — M3500 Sicca syndrome, unspecified: Secondary | ICD-10-CM | POA: Diagnosis not present

## 2017-12-18 DIAGNOSIS — I5023 Acute on chronic systolic (congestive) heart failure: Secondary | ICD-10-CM | POA: Diagnosis not present

## 2017-12-18 DIAGNOSIS — I2510 Atherosclerotic heart disease of native coronary artery without angina pectoris: Secondary | ICD-10-CM | POA: Diagnosis not present

## 2017-12-18 DIAGNOSIS — G43019 Migraine without aura, intractable, without status migrainosus: Secondary | ICD-10-CM | POA: Diagnosis not present

## 2017-12-18 DIAGNOSIS — R1900 Intra-abdominal and pelvic swelling, mass and lump, unspecified site: Secondary | ICD-10-CM | POA: Diagnosis not present

## 2017-12-18 DIAGNOSIS — J449 Chronic obstructive pulmonary disease, unspecified: Secondary | ICD-10-CM | POA: Diagnosis not present

## 2017-12-18 DIAGNOSIS — I110 Hypertensive heart disease with heart failure: Secondary | ICD-10-CM | POA: Diagnosis not present

## 2017-12-18 DIAGNOSIS — Z48815 Encounter for surgical aftercare following surgery on the digestive system: Secondary | ICD-10-CM | POA: Diagnosis not present

## 2017-12-20 DIAGNOSIS — Z48815 Encounter for surgical aftercare following surgery on the digestive system: Secondary | ICD-10-CM | POA: Diagnosis not present

## 2017-12-20 DIAGNOSIS — G2581 Restless legs syndrome: Secondary | ICD-10-CM | POA: Diagnosis not present

## 2017-12-20 DIAGNOSIS — F339 Major depressive disorder, recurrent, unspecified: Secondary | ICD-10-CM | POA: Diagnosis not present

## 2017-12-20 DIAGNOSIS — I255 Ischemic cardiomyopathy: Secondary | ICD-10-CM | POA: Diagnosis not present

## 2017-12-20 DIAGNOSIS — I5023 Acute on chronic systolic (congestive) heart failure: Secondary | ICD-10-CM | POA: Diagnosis not present

## 2017-12-20 DIAGNOSIS — I200 Unstable angina: Secondary | ICD-10-CM | POA: Diagnosis not present

## 2017-12-20 DIAGNOSIS — S301XXD Contusion of abdominal wall, subsequent encounter: Secondary | ICD-10-CM | POA: Diagnosis not present

## 2017-12-20 DIAGNOSIS — G4733 Obstructive sleep apnea (adult) (pediatric): Secondary | ICD-10-CM | POA: Diagnosis not present

## 2017-12-20 DIAGNOSIS — I10 Essential (primary) hypertension: Secondary | ICD-10-CM | POA: Diagnosis not present

## 2017-12-20 DIAGNOSIS — I2510 Atherosclerotic heart disease of native coronary artery without angina pectoris: Secondary | ICD-10-CM | POA: Diagnosis not present

## 2017-12-20 DIAGNOSIS — N3941 Urge incontinence: Secondary | ICD-10-CM | POA: Diagnosis not present

## 2017-12-20 DIAGNOSIS — G43019 Migraine without aura, intractable, without status migrainosus: Secondary | ICD-10-CM | POA: Diagnosis not present

## 2017-12-20 DIAGNOSIS — R1900 Intra-abdominal and pelvic swelling, mass and lump, unspecified site: Secondary | ICD-10-CM | POA: Diagnosis not present

## 2017-12-20 DIAGNOSIS — I472 Ventricular tachycardia: Secondary | ICD-10-CM | POA: Diagnosis not present

## 2017-12-20 DIAGNOSIS — J449 Chronic obstructive pulmonary disease, unspecified: Secondary | ICD-10-CM | POA: Diagnosis not present

## 2017-12-20 DIAGNOSIS — M3500 Sicca syndrome, unspecified: Secondary | ICD-10-CM | POA: Diagnosis not present

## 2017-12-22 DIAGNOSIS — I10 Essential (primary) hypertension: Secondary | ICD-10-CM | POA: Diagnosis not present

## 2017-12-22 DIAGNOSIS — Z48815 Encounter for surgical aftercare following surgery on the digestive system: Secondary | ICD-10-CM | POA: Diagnosis not present

## 2017-12-22 DIAGNOSIS — S301XXD Contusion of abdominal wall, subsequent encounter: Secondary | ICD-10-CM | POA: Diagnosis not present

## 2017-12-22 DIAGNOSIS — I255 Ischemic cardiomyopathy: Secondary | ICD-10-CM | POA: Diagnosis not present

## 2017-12-22 DIAGNOSIS — G43019 Migraine without aura, intractable, without status migrainosus: Secondary | ICD-10-CM | POA: Diagnosis not present

## 2017-12-22 DIAGNOSIS — R1900 Intra-abdominal and pelvic swelling, mass and lump, unspecified site: Secondary | ICD-10-CM | POA: Diagnosis not present

## 2017-12-22 DIAGNOSIS — I200 Unstable angina: Secondary | ICD-10-CM | POA: Diagnosis not present

## 2017-12-22 DIAGNOSIS — J449 Chronic obstructive pulmonary disease, unspecified: Secondary | ICD-10-CM | POA: Diagnosis not present

## 2017-12-22 DIAGNOSIS — N3941 Urge incontinence: Secondary | ICD-10-CM | POA: Diagnosis not present

## 2017-12-22 DIAGNOSIS — G2581 Restless legs syndrome: Secondary | ICD-10-CM | POA: Diagnosis not present

## 2017-12-22 DIAGNOSIS — I2510 Atherosclerotic heart disease of native coronary artery without angina pectoris: Secondary | ICD-10-CM | POA: Diagnosis not present

## 2017-12-22 DIAGNOSIS — F339 Major depressive disorder, recurrent, unspecified: Secondary | ICD-10-CM | POA: Diagnosis not present

## 2017-12-22 DIAGNOSIS — M3500 Sicca syndrome, unspecified: Secondary | ICD-10-CM | POA: Diagnosis not present

## 2017-12-22 DIAGNOSIS — G4733 Obstructive sleep apnea (adult) (pediatric): Secondary | ICD-10-CM | POA: Diagnosis not present

## 2017-12-22 DIAGNOSIS — I5023 Acute on chronic systolic (congestive) heart failure: Secondary | ICD-10-CM | POA: Diagnosis not present

## 2017-12-22 DIAGNOSIS — I472 Ventricular tachycardia: Secondary | ICD-10-CM | POA: Diagnosis not present

## 2017-12-23 DIAGNOSIS — Z48815 Encounter for surgical aftercare following surgery on the digestive system: Secondary | ICD-10-CM | POA: Diagnosis not present

## 2017-12-23 DIAGNOSIS — R1900 Intra-abdominal and pelvic swelling, mass and lump, unspecified site: Secondary | ICD-10-CM | POA: Diagnosis not present

## 2017-12-23 DIAGNOSIS — S301XXD Contusion of abdominal wall, subsequent encounter: Secondary | ICD-10-CM | POA: Diagnosis not present

## 2017-12-24 ENCOUNTER — Emergency Department (HOSPITAL_COMMUNITY): Payer: Federal, State, Local not specified - PPO

## 2017-12-24 ENCOUNTER — Encounter (HOSPITAL_COMMUNITY): Payer: Self-pay

## 2017-12-24 ENCOUNTER — Inpatient Hospital Stay (HOSPITAL_COMMUNITY)
Admission: EM | Admit: 2017-12-24 | Discharge: 2017-12-26 | DRG: 377 | Disposition: A | Discharge status: Home / Self Care | Payer: Federal, State, Local not specified - PPO | Attending: Internal Medicine | Admitting: Internal Medicine

## 2017-12-24 DIAGNOSIS — E1165 Type 2 diabetes mellitus with hyperglycemia: Secondary | ICD-10-CM | POA: Diagnosis not present

## 2017-12-24 DIAGNOSIS — R079 Chest pain, unspecified: Secondary | ICD-10-CM | POA: Diagnosis not present

## 2017-12-24 DIAGNOSIS — Z6841 Body Mass Index (BMI) 40.0 and over, adult: Secondary | ICD-10-CM

## 2017-12-24 DIAGNOSIS — X58XXXA Exposure to other specified factors, initial encounter: Secondary | ICD-10-CM | POA: Diagnosis present

## 2017-12-24 DIAGNOSIS — Z8249 Family history of ischemic heart disease and other diseases of the circulatory system: Secondary | ICD-10-CM

## 2017-12-24 DIAGNOSIS — E039 Hypothyroidism, unspecified: Secondary | ICD-10-CM | POA: Diagnosis present

## 2017-12-24 DIAGNOSIS — J9610 Chronic respiratory failure, unspecified whether with hypoxia or hypercapnia: Secondary | ICD-10-CM | POA: Diagnosis not present

## 2017-12-24 DIAGNOSIS — E876 Hypokalemia: Secondary | ICD-10-CM | POA: Diagnosis not present

## 2017-12-24 DIAGNOSIS — J9611 Chronic respiratory failure with hypoxia: Secondary | ICD-10-CM

## 2017-12-24 DIAGNOSIS — E1122 Type 2 diabetes mellitus with diabetic chronic kidney disease: Secondary | ICD-10-CM | POA: Diagnosis not present

## 2017-12-24 DIAGNOSIS — I130 Hypertensive heart and chronic kidney disease with heart failure and stage 1 through stage 4 chronic kidney disease, or unspecified chronic kidney disease: Secondary | ICD-10-CM

## 2017-12-24 DIAGNOSIS — Z9889 Other specified postprocedural states: Secondary | ICD-10-CM | POA: Diagnosis not present

## 2017-12-24 DIAGNOSIS — N3281 Overactive bladder: Secondary | ICD-10-CM | POA: Diagnosis present

## 2017-12-24 DIAGNOSIS — Z7982 Long term (current) use of aspirin: Secondary | ICD-10-CM | POA: Diagnosis not present

## 2017-12-24 DIAGNOSIS — N182 Chronic kidney disease, stage 2 (mild): Secondary | ICD-10-CM | POA: Diagnosis present

## 2017-12-24 DIAGNOSIS — F410 Panic disorder [episodic paroxysmal anxiety] without agoraphobia: Secondary | ICD-10-CM | POA: Diagnosis present

## 2017-12-24 DIAGNOSIS — R609 Edema, unspecified: Secondary | ICD-10-CM | POA: Diagnosis not present

## 2017-12-24 DIAGNOSIS — Z79899 Other long term (current) drug therapy: Secondary | ICD-10-CM

## 2017-12-24 DIAGNOSIS — Z955 Presence of coronary angioplasty implant and graft: Secondary | ICD-10-CM

## 2017-12-24 DIAGNOSIS — D500 Iron deficiency anemia secondary to blood loss (chronic): Secondary | ICD-10-CM | POA: Diagnosis not present

## 2017-12-24 DIAGNOSIS — Z978 Presence of other specified devices: Secondary | ICD-10-CM

## 2017-12-24 DIAGNOSIS — R05 Cough: Secondary | ICD-10-CM | POA: Diagnosis not present

## 2017-12-24 DIAGNOSIS — K922 Gastrointestinal hemorrhage, unspecified: Secondary | ICD-10-CM

## 2017-12-24 DIAGNOSIS — I5032 Chronic diastolic (congestive) heart failure: Secondary | ICD-10-CM | POA: Diagnosis not present

## 2017-12-24 DIAGNOSIS — Z833 Family history of diabetes mellitus: Secondary | ICD-10-CM

## 2017-12-24 DIAGNOSIS — S301XXA Contusion of abdominal wall, initial encounter: Secondary | ICD-10-CM | POA: Diagnosis present

## 2017-12-24 DIAGNOSIS — I255 Ischemic cardiomyopathy: Secondary | ICD-10-CM | POA: Diagnosis present

## 2017-12-24 DIAGNOSIS — J449 Chronic obstructive pulmonary disease, unspecified: Secondary | ICD-10-CM

## 2017-12-24 DIAGNOSIS — L258 Unspecified contact dermatitis due to other agents: Secondary | ICD-10-CM | POA: Diagnosis present

## 2017-12-24 DIAGNOSIS — Z9981 Dependence on supplemental oxygen: Secondary | ICD-10-CM

## 2017-12-24 DIAGNOSIS — E873 Alkalosis: Secondary | ICD-10-CM | POA: Diagnosis present

## 2017-12-24 DIAGNOSIS — I509 Heart failure, unspecified: Secondary | ICD-10-CM | POA: Diagnosis not present

## 2017-12-24 DIAGNOSIS — J440 Chronic obstructive pulmonary disease with acute lower respiratory infection: Secondary | ICD-10-CM | POA: Diagnosis present

## 2017-12-24 DIAGNOSIS — Z79891 Long term (current) use of opiate analgesic: Secondary | ICD-10-CM | POA: Diagnosis not present

## 2017-12-24 DIAGNOSIS — G4700 Insomnia, unspecified: Secondary | ICD-10-CM | POA: Diagnosis not present

## 2017-12-24 DIAGNOSIS — K649 Unspecified hemorrhoids: Secondary | ICD-10-CM | POA: Diagnosis not present

## 2017-12-24 DIAGNOSIS — K5791 Diverticulosis of intestine, part unspecified, without perforation or abscess with bleeding: Principal | ICD-10-CM | POA: Diagnosis present

## 2017-12-24 DIAGNOSIS — Z7902 Long term (current) use of antithrombotics/antiplatelets: Secondary | ICD-10-CM

## 2017-12-24 DIAGNOSIS — E2749 Other adrenocortical insufficiency: Secondary | ICD-10-CM

## 2017-12-24 DIAGNOSIS — L02211 Cutaneous abscess of abdominal wall: Secondary | ICD-10-CM | POA: Diagnosis present

## 2017-12-24 DIAGNOSIS — I5023 Acute on chronic systolic (congestive) heart failure: Secondary | ICD-10-CM | POA: Diagnosis present

## 2017-12-24 DIAGNOSIS — K648 Other hemorrhoids: Secondary | ICD-10-CM | POA: Diagnosis not present

## 2017-12-24 DIAGNOSIS — F17210 Nicotine dependence, cigarettes, uncomplicated: Secondary | ICD-10-CM | POA: Diagnosis present

## 2017-12-24 DIAGNOSIS — Y95 Nosocomial condition: Secondary | ICD-10-CM | POA: Diagnosis present

## 2017-12-24 DIAGNOSIS — E11649 Type 2 diabetes mellitus with hypoglycemia without coma: Secondary | ICD-10-CM | POA: Diagnosis present

## 2017-12-24 DIAGNOSIS — Z888 Allergy status to other drugs, medicaments and biological substances status: Secondary | ICD-10-CM | POA: Diagnosis not present

## 2017-12-24 DIAGNOSIS — Z9049 Acquired absence of other specified parts of digestive tract: Secondary | ICD-10-CM

## 2017-12-24 DIAGNOSIS — T380X5A Adverse effect of glucocorticoids and synthetic analogues, initial encounter: Secondary | ICD-10-CM | POA: Diagnosis present

## 2017-12-24 DIAGNOSIS — I472 Ventricular tachycardia: Secondary | ICD-10-CM | POA: Diagnosis not present

## 2017-12-24 DIAGNOSIS — K625 Hemorrhage of anus and rectum: Secondary | ICD-10-CM | POA: Diagnosis not present

## 2017-12-24 DIAGNOSIS — R404 Transient alteration of awareness: Secondary | ICD-10-CM | POA: Diagnosis not present

## 2017-12-24 DIAGNOSIS — Z794 Long term (current) use of insulin: Secondary | ICD-10-CM

## 2017-12-24 DIAGNOSIS — I5022 Chronic systolic (congestive) heart failure: Secondary | ICD-10-CM | POA: Diagnosis not present

## 2017-12-24 DIAGNOSIS — Z7951 Long term (current) use of inhaled steroids: Secondary | ICD-10-CM | POA: Diagnosis not present

## 2017-12-24 DIAGNOSIS — N189 Chronic kidney disease, unspecified: Secondary | ICD-10-CM | POA: Diagnosis not present

## 2017-12-24 DIAGNOSIS — Z79890 Hormone replacement therapy (postmenopausal): Secondary | ICD-10-CM

## 2017-12-24 DIAGNOSIS — E785 Hyperlipidemia, unspecified: Secondary | ICD-10-CM | POA: Diagnosis present

## 2017-12-24 DIAGNOSIS — Z7952 Long term (current) use of systemic steroids: Secondary | ICD-10-CM

## 2017-12-24 DIAGNOSIS — E1140 Type 2 diabetes mellitus with diabetic neuropathy, unspecified: Secondary | ICD-10-CM | POA: Diagnosis present

## 2017-12-24 DIAGNOSIS — Z8719 Personal history of other diseases of the digestive system: Secondary | ICD-10-CM

## 2017-12-24 DIAGNOSIS — I252 Old myocardial infarction: Secondary | ICD-10-CM

## 2017-12-24 DIAGNOSIS — G4730 Sleep apnea, unspecified: Secondary | ICD-10-CM | POA: Diagnosis present

## 2017-12-24 DIAGNOSIS — R457 State of emotional shock and stress, unspecified: Secondary | ICD-10-CM | POA: Diagnosis not present

## 2017-12-24 DIAGNOSIS — I110 Hypertensive heart disease with heart failure: Secondary | ICD-10-CM | POA: Diagnosis not present

## 2017-12-24 DIAGNOSIS — Z91048 Other nonmedicinal substance allergy status: Secondary | ICD-10-CM | POA: Diagnosis not present

## 2017-12-24 DIAGNOSIS — I25118 Atherosclerotic heart disease of native coronary artery with other forms of angina pectoris: Secondary | ICD-10-CM | POA: Diagnosis not present

## 2017-12-24 DIAGNOSIS — E2740 Unspecified adrenocortical insufficiency: Secondary | ICD-10-CM | POA: Diagnosis not present

## 2017-12-24 DIAGNOSIS — R32 Unspecified urinary incontinence: Secondary | ICD-10-CM | POA: Diagnosis present

## 2017-12-24 DIAGNOSIS — R0789 Other chest pain: Secondary | ICD-10-CM | POA: Diagnosis not present

## 2017-12-24 DIAGNOSIS — E872 Acidosis: Secondary | ICD-10-CM | POA: Diagnosis not present

## 2017-12-24 DIAGNOSIS — Z9861 Coronary angioplasty status: Secondary | ICD-10-CM | POA: Diagnosis not present

## 2017-12-24 DIAGNOSIS — Z823 Family history of stroke: Secondary | ICD-10-CM

## 2017-12-24 DIAGNOSIS — M3500 Sicca syndrome, unspecified: Secondary | ICD-10-CM | POA: Diagnosis present

## 2017-12-24 DIAGNOSIS — J189 Pneumonia, unspecified organism: Secondary | ICD-10-CM | POA: Diagnosis not present

## 2017-12-24 DIAGNOSIS — D62 Acute posthemorrhagic anemia: Secondary | ICD-10-CM | POA: Diagnosis not present

## 2017-12-24 DIAGNOSIS — R0602 Shortness of breath: Secondary | ICD-10-CM | POA: Diagnosis not present

## 2017-12-24 DIAGNOSIS — R739 Hyperglycemia, unspecified: Secondary | ICD-10-CM

## 2017-12-24 DIAGNOSIS — K920 Hematemesis: Secondary | ICD-10-CM | POA: Diagnosis not present

## 2017-12-24 DIAGNOSIS — I2510 Atherosclerotic heart disease of native coronary artery without angina pectoris: Secondary | ICD-10-CM

## 2017-12-24 DIAGNOSIS — E119 Type 2 diabetes mellitus without complications: Secondary | ICD-10-CM | POA: Diagnosis not present

## 2017-12-24 DIAGNOSIS — E063 Autoimmune thyroiditis: Secondary | ICD-10-CM | POA: Diagnosis present

## 2017-12-24 LAB — CBC
HEMATOCRIT: 26.2 % — AB (ref 36.0–46.0)
Hemoglobin: 8.1 g/dL — ABNORMAL LOW (ref 12.0–15.0)
MCH: 28.9 pg (ref 26.0–34.0)
MCHC: 30.9 g/dL (ref 30.0–36.0)
MCV: 93.6 fL (ref 78.0–100.0)
PLATELETS: 248 10*3/uL (ref 150–400)
RBC: 2.8 MIL/uL — ABNORMAL LOW (ref 3.87–5.11)
RDW: 18.1 % — AB (ref 11.5–15.5)
WBC: 7.6 10*3/uL (ref 4.0–10.5)

## 2017-12-24 LAB — COMPREHENSIVE METABOLIC PANEL
ALT: 38 U/L (ref 0–44)
AST: 21 U/L (ref 15–41)
Albumin: 2.2 g/dL — ABNORMAL LOW (ref 3.5–5.0)
Alkaline Phosphatase: 93 U/L (ref 38–126)
Anion gap: 9 (ref 5–15)
BILIRUBIN TOTAL: 0.3 mg/dL (ref 0.3–1.2)
BUN: 21 mg/dL (ref 8–23)
CO2: 38 mmol/L — ABNORMAL HIGH (ref 22–32)
Calcium: 7.8 mg/dL — ABNORMAL LOW (ref 8.9–10.3)
Chloride: 88 mmol/L — ABNORMAL LOW (ref 98–111)
Creatinine, Ser: 1.05 mg/dL — ABNORMAL HIGH (ref 0.44–1.00)
GFR, EST NON AFRICAN AMERICAN: 56 mL/min — AB (ref >60)
Glucose, Bld: 627 mg/dL (ref 70–99)
POTASSIUM: 3.6 mmol/L (ref 3.5–5.1)
Sodium: 135 mmol/L (ref 135–145)
Total Protein: 5.3 g/dL — ABNORMAL LOW (ref 6.5–8.1)

## 2017-12-24 LAB — CBG MONITORING, ED
GLUCOSE-CAPILLARY: 442 mg/dL — AB (ref 70–99)
GLUCOSE-CAPILLARY: 386 mg/dL — AB (ref 70–99)
GLUCOSE-CAPILLARY: 268 mg/dL — AB (ref 70–99)
Glucose-Capillary: 522 mg/dL (ref 70–99)
Glucose-Capillary: 145 mg/dL — ABNORMAL HIGH (ref 70–99)
Glucose-Capillary: 114 mg/dL — ABNORMAL HIGH (ref 70–99)
Glucose-Capillary: 84 mg/dL (ref 70–99)
Glucose-Capillary: 129 mg/dL — ABNORMAL HIGH (ref 70–99)

## 2017-12-24 LAB — HEMOGLOBIN AND HEMATOCRIT, BLOOD
HEMATOCRIT: 31.5 % — AB (ref 36.0–46.0)
Hemoglobin: 10.3 g/dL — ABNORMAL LOW (ref 12.0–15.0)

## 2017-12-24 LAB — GLUCOSE, CAPILLARY
Glucose-Capillary: 126 mg/dL — ABNORMAL HIGH (ref 70–99)
Glucose-Capillary: 75 mg/dL (ref 70–99)

## 2017-12-24 LAB — I-STAT CG4 LACTIC ACID, ED: LACTIC ACID, VENOUS: 1.26 mmol/L (ref 0.5–1.9)

## 2017-12-24 LAB — PREPARE RBC (CROSSMATCH)

## 2017-12-24 LAB — POC OCCULT BLOOD, ED: Fecal Occult Bld: POSITIVE — AB

## 2017-12-24 MED ORDER — HYDROCORTISONE 5 MG PO TABS
5.0000 mg | ORAL_TABLET | Freq: Every day | ORAL | Status: DC
  Administered 2017-12-25 – 2017-12-26 (×2): 5 mg via ORAL
  Filled 2017-12-24 (×2): qty 1

## 2017-12-24 MED ORDER — DULOXETINE HCL 60 MG PO CPEP
60.0000 mg | ORAL_CAPSULE | Freq: Every day | ORAL | Status: DC
  Administered 2017-12-24 – 2017-12-26 (×3): 60 mg via ORAL
  Filled 2017-12-24 (×3): qty 1

## 2017-12-24 MED ORDER — SODIUM CHLORIDE 0.9 % IV SOLN
INTRAVENOUS | Status: DC
  Administered 2017-12-24: 4.6 [IU]/h via INTRAVENOUS
  Filled 2017-12-24: qty 1

## 2017-12-24 MED ORDER — OXYCODONE HCL 5 MG PO TABS
5.0000 mg | ORAL_TABLET | ORAL | Status: DC | PRN
  Administered 2017-12-24 – 2017-12-25 (×2): 5 mg via ORAL
  Filled 2017-12-24 (×2): qty 1

## 2017-12-24 MED ORDER — DEXTROSE-NACL 5-0.45 % IV SOLN
INTRAVENOUS | Status: DC
  Administered 2017-12-24 – 2017-12-25 (×2): via INTRAVENOUS

## 2017-12-24 MED ORDER — SODIUM CHLORIDE 0.9 % IV BOLUS
1000.0000 mL | Freq: Once | INTRAVENOUS | Status: AC
  Administered 2017-12-24: 1000 mL via INTRAVENOUS

## 2017-12-24 MED ORDER — SODIUM CHLORIDE 0.9% FLUSH: 3.0000 mL | INTRAVENOUS | Status: DC | PRN

## 2017-12-24 MED ORDER — SODIUM CHLORIDE 0.9 % IV SOLN
INTRAVENOUS | Status: DC
  Filled 2017-12-24: qty 1

## 2017-12-24 MED ORDER — INSULIN ASPART 100 UNIT/ML ~~LOC~~ SOLN
0.0000 [IU] | Freq: Every day | SUBCUTANEOUS | Status: DC
  Administered 2017-12-25: 3 [IU] via SUBCUTANEOUS

## 2017-12-24 MED ORDER — SODIUM CHLORIDE 0.9% FLUSH
3.0000 mL | Freq: Two times a day (BID) | INTRAVENOUS | Status: DC
  Administered 2017-12-24 – 2017-12-25 (×2): 3 mL via INTRAVENOUS

## 2017-12-24 MED ORDER — ACETAMINOPHEN 650 MG RE SUPP: 650.0000 mg | Freq: Four times a day (QID) | RECTAL | Status: DC | PRN

## 2017-12-24 MED ORDER — HYDROCORTISONE 5 MG PO TABS: 5.0000 mg | ORAL_TABLET | ORAL | Status: DC

## 2017-12-24 MED ORDER — ACETAMINOPHEN 325 MG PO TABS: 650.0000 mg | ORAL_TABLET | Freq: Four times a day (QID) | ORAL | Status: DC | PRN

## 2017-12-24 MED ORDER — SODIUM CHLORIDE 0.9 % IV BOLUS: 1000.0000 mL | Freq: Once | INTRAVENOUS | Status: DC

## 2017-12-24 MED ORDER — FLUDROCORTISONE ACETATE 0.1 MG PO TABS
0.1000 mg | ORAL_TABLET | Freq: Two times a day (BID) | ORAL | Status: DC
  Administered 2017-12-24 – 2017-12-26 (×4): 0.1 mg via ORAL
  Filled 2017-12-24 (×5): qty 1

## 2017-12-24 MED ORDER — IPRATROPIUM-ALBUTEROL 0.5-2.5 (3) MG/3ML IN SOLN
3.0000 mL | Freq: Three times a day (TID) | RESPIRATORY_TRACT | Status: DC
  Administered 2017-12-25 – 2017-12-26 (×4): 3 mL via RESPIRATORY_TRACT
  Filled 2017-12-24 (×5): qty 3

## 2017-12-24 MED ORDER — HYDROCORTISONE 5 MG PO TABS
15.0000 mg | ORAL_TABLET | ORAL | Status: DC
  Administered 2017-12-25 – 2017-12-26 (×2): 15 mg via ORAL
  Filled 2017-12-24 (×2): qty 1

## 2017-12-24 MED ORDER — OXYBUTYNIN CHLORIDE ER 15 MG PO TB24
15.0000 mg | ORAL_TABLET | Freq: Every day | ORAL | Status: DC
  Administered 2017-12-24 – 2017-12-25 (×2): 15 mg via ORAL
  Filled 2017-12-24 (×3): qty 1

## 2017-12-24 MED ORDER — SODIUM CHLORIDE 0.9 % IV SOLN
10.0000 mL/h | Freq: Once | INTRAVENOUS | Status: AC
  Administered 2017-12-24: 10 mL/h via INTRAVENOUS

## 2017-12-24 MED ORDER — SODIUM CHLORIDE 0.9% FLUSH
3.0000 mL | Freq: Two times a day (BID) | INTRAVENOUS | Status: DC
  Administered 2017-12-24: 3 mL via INTRAVENOUS

## 2017-12-24 MED ORDER — IPRATROPIUM-ALBUTEROL 0.5-2.5 (3) MG/3ML IN SOLN: 3.0000 mL | RESPIRATORY_TRACT | Status: DC | PRN

## 2017-12-24 MED ORDER — IOHEXOL 300 MG/ML  SOLN
100.0000 mL | Freq: Once | INTRAMUSCULAR | Status: AC | PRN
  Administered 2017-12-24: 100 mL via INTRAVENOUS

## 2017-12-24 MED ORDER — INSULIN ASPART 100 UNIT/ML ~~LOC~~ SOLN
0.0000 [IU] | Freq: Three times a day (TID) | SUBCUTANEOUS | Status: DC
  Administered 2017-12-24: 2 [IU] via SUBCUTANEOUS
  Administered 2017-12-25: 5 [IU] via SUBCUTANEOUS

## 2017-12-24 MED ORDER — INSULIN GLARGINE 100 UNIT/ML ~~LOC~~ SOLN
30.0000 [IU] | Freq: Every day | SUBCUTANEOUS | Status: DC
  Administered 2017-12-24 – 2017-12-26 (×3): 30 [IU] via SUBCUTANEOUS
  Filled 2017-12-24 (×3): qty 0.3

## 2017-12-24 MED ORDER — FLUTICASONE-UMECLIDIN-VILANT 100-62.5-25 MCG/INH IN AEPB: 1.0000 | INHALATION_SPRAY | Freq: Every day | RESPIRATORY_TRACT | Status: DC

## 2017-12-24 MED ORDER — ATORVASTATIN CALCIUM 20 MG PO TABS
80.0000 mg | ORAL_TABLET | Freq: Every day | ORAL | Status: DC
  Administered 2017-12-24 – 2017-12-25 (×2): 80 mg via ORAL
  Filled 2017-12-24 (×2): qty 4

## 2017-12-24 MED ORDER — SODIUM CHLORIDE 0.9 % IV BOLUS
500.0000 mL | Freq: Once | INTRAVENOUS | Status: AC
  Administered 2017-12-24: 500 mL via INTRAVENOUS

## 2017-12-24 MED ORDER — SODIUM CHLORIDE 0.9 % IV SOLN: 250.0000 mL | INTRAVENOUS | Status: DC | PRN

## 2017-12-24 MED ORDER — IPRATROPIUM-ALBUTEROL 0.5-2.5 (3) MG/3ML IN SOLN
3.0000 mL | RESPIRATORY_TRACT | Status: DC
  Administered 2017-12-24 (×2): 3 mL via RESPIRATORY_TRACT
  Filled 2017-12-24 (×2): qty 3

## 2017-12-24 MED ORDER — LEVOTHYROXINE SODIUM 150 MCG PO TABS
150.0000 ug | ORAL_TABLET | Freq: Every day | ORAL | Status: DC
  Administered 2017-12-25 – 2017-12-26 (×2): 150 ug via ORAL
  Filled 2017-12-24: qty 2
  Filled 2017-12-24 (×2): qty 1
  Filled 2017-12-24: qty 2

## 2017-12-24 MED ORDER — IPRATROPIUM-ALBUTEROL 0.5-2.5 (3) MG/3ML IN SOLN
3.0000 mL | Freq: Four times a day (QID) | RESPIRATORY_TRACT | Status: DC
  Administered 2017-12-24: 3 mL via RESPIRATORY_TRACT
  Filled 2017-12-24: qty 3

## 2017-12-24 NOTE — ED Notes (Signed)
Pt given diet sprite 

## 2017-12-24 NOTE — Consult Note (Signed)
Cardiology Consultation:   Patient ID: Crystal Yates MRN: [id number]; DOB: [birth year]  Admit date: 12/24/2017 Date of Consult: 12/24/2017  Primary Care Provider: Dortha Kern, PA Primary Cardiologist: Jenne Campus, MD Primary Electrophysiologist:  None    Patient Profile:   Crystal Yates is a 62 y.o. female with a hx of CAD s/p prior PCI who is being seen today for the evaluation of anemia at the request of Dr Lynnae January.  History of Present Illness:   Ms. Crystal Yates presents with GI bleeding.  Over the past 5 days, she has noticed BRBPR.  She has developed progressive symptoms of dizziness, weakness, and fatigue.  She presented to [facility] and is found to have significant drop in hemaglobin.  She is admitted for workup.  Cardiology is asked to make recommendations regarding antiplatelet therapy. She has known CAD.  She is s/p PCI of LAD and RCA previously.  Her most recent cath was 10/23/17 at which time she had balloon angioplasty of RCA at site of ISR.  She has done well since.  Past Medical History:  Diagnosis Date  . Acute on chronic systolic heart failure exacerbation(HCC) 04/08/2016  . CAD in native artery    a. Prior LAD stenting based on cath. b. RCA stenting 03/2016 x2.  . Chronic combined systolic and diastolic CHF (congestive heart failure) ([place])   . CKD (chronic kidney disease), stage II   . COPD (chronic obstructive pulmonary disease) ([place])   . Diabetes mellitus without complication ([place])   . Dyspnea   . Hashimoto's thyroiditis   . Hyperlipidemia   . Hypertension   . Myocardial infarction ([place])   . On home oxygen therapy    "2L; 24/7" (10/23/2017)  . Secondary adrenal insufficiency ([place])   . Thyroid disease   . Tobacco abuse     Past Surgical History:  Procedure Laterality Date  . ABDOMINAL SURGERY    . CESAREAN SECTION    . CHOLECYSTECTOMY    . COLON RESECTION    . CORONARY BALLOON ANGIOPLASTY N/A 10/13/2017   Procedure: CORONARY  BALLOON ANGIOPLASTY;  Surgeon: Belva Crome, MD;  Location: [place] [facility];  Service: Cardiovascular;  Laterality: N/A;  . HERNIA MESH REMOVAL    . HERNIA REPAIR    . LEFT HEART CATH AND CORONARY ANGIOGRAPHY N/A 10/13/2016   Procedure: Left Heart Cath and Coronary Angiography;  Surgeon: Nelva Bush, MD;  Location: [place] [facility];  Service: Cardiovascular;  Laterality: N/A;  . RIGHT/LEFT HEART CATH AND CORONARY ANGIOGRAPHY N/A 10/13/2017   Procedure: RIGHT/LEFT HEART CATH AND CORONARY ANGIOGRAPHY;  Surgeon: Belva Crome, MD;  Location: [place] [facility];  Service: Cardiovascular;  Laterality: N/A;  . SHOULDER ARTHROSCOPY    . TUBAL LIGATION        Inpatient Medications: Scheduled Meds: . atorvastatin  80 mg Oral q1800  . DULoxetine  60 mg Oral Daily  . fludrocortisone  0.1 mg Oral BID  . Fluticasone-Umeclidin-Vilant  1 puff Inhalation Daily  . [START ON 12/25/2017] hydrocortisone  15 mg Oral BH-q7a  . [START ON 12/25/2017] hydrocortisone  5 mg Oral Q lunch  . insulin aspart  0-15 Units Subcutaneous TID WC  . insulin aspart  0-5 Units Subcutaneous QHS  . insulin glargine  30 Units Subcutaneous Daily  . ipratropium-albuterol  3 mL Nebulization Q4H  . [START ON 12/25/2017] levothyroxine  150 mcg Oral QAC breakfast  . oxybutynin  15 mg Oral QHS  . sodium chloride flush  3 mL  Intravenous Q12H  . sodium chloride flush  3 mL Intravenous Q12H   Continuous Infusions: . sodium chloride    . dextrose 5 % and 0.45% NaCl 75 mL/hr at 12/24/17 1207  . insulin (NOVOLIN-R) infusion Stopped (12/24/17 1217)   PRN Meds: sodium chloride, acetaminophen **OR** acetaminophen, oxyCODONE, sodium chloride flush  Allergies:    Allergies  Allergen Reactions  . Hydroxychloroquine Shortness Of Breath, Nausea Only and Other (See Comments)    Dizziness (also)  . Donepezil Other (See Comments)    Dizziness, depression, and makes the patient feel "funny"  . Prednisone Other (See Comments)     Causes depression and suicidal thoughts  . Anticoagulant Cit Dext [Acd Formula A] Other (See Comments)    Unknown  . Bupropion Other (See Comments)    Suicidal thoughts  . Metrizamide Other (See Comments)    (a non-ionic radiopaque contrast agent) "Blows the vein" and contrast gathers at the injected site's limb  . Varenicline Other (See Comments)    Suicidal thoughts  . Tape Rash and Other (See Comments)    Paper tape is preferred, PLEASE    Social History:   Social History   Socioeconomic History  . Marital status: Married    Spouse name: Not on file  . Number of children: Not on file  . Years of education: Not on file  . Highest education level: Not on file  Occupational History  . Not on file  Social Needs  . Financial resource strain: Not on file  . Food insecurity:    Worry: Not on file    Inability: Not on file  . Transportation needs:    Medical: Not on file    Non-medical: Not on file  Tobacco Use  . Smoking status: Current Every Day Smoker    Packs/day: 0.75    Years: 44.00    Pack years: 33.00    Types: Cigarettes  . Smokeless tobacco: Never Used  Substance and Sexual Activity  . Alcohol use: Yes    Alcohol/week: 7.0 standard drinks    Types: 7 Shots of liquor per week  . Drug use: Yes    Types: Marijuana    Comment: 10/23/2017 "had 1 ~ 1 month ago; nothing before or since"  . Sexual activity: Yes  Lifestyle  . Physical activity:    Days per week: Not on file    Minutes per session: Not on file  . Stress: Not on file  Relationships  . Social connections:    Talks on phone: Not on file    Gets together: Not on file    Attends religious service: Not on file    Active member of club or organization: Not on file    Attends meetings of clubs or organizations: Not on file    Relationship status: Not on file  . Intimate partner violence:    Fear of current or ex partner: Not on file    Emotionally abused: Not on file    Physically abused: Not on file     Forced sexual activity: Not on file  Other Topics Concern  . Not on file  Social History Narrative  . Not on file    Family History:    Family History  Problem Relation Age of Onset  . Stroke Mother   . Diabetes Father   . Diabetes Sister   . Diabetes Sister   . Diabetes Son      ROS:  Please see the history of present illness.  All other ROS reviewed and negative.     Physical Exam/Data:   Vitals:   12/24/17 1415 12/24/17 1430 12/24/17 1508 12/24/17 1532  BP: 127/61 (!) 141/55 (!) 130/97   Pulse: (!) 101 (!) 103 (!) 108   Resp: 19 15    Temp:   98 F (36.7 C)   TempSrc:   Oral   SpO2: 97% 96% 97% 94%    Intake/Output Summary (Last 24 hours) at 12/24/2017 1540 Last data filed at 12/24/2017 1255 Gross per 24 hour  Intake 810 ml  Output -  Net 810 ml   General:  overweight, in no acute distress HEENT: normal Lymph: no adenopathy Cardiac:  normal S1, S2; RRR; no murmur  Lungs:  clear to auscultation bilaterally, no wheezing, rhonchi or rales  Abd: soft, nontender, no hepatomegaly  Ext: no edema Musculoskeletal:  No deformities, BUE and BLE strength normal and equal Skin: warm and dry  Neuro:  CNs 2-12 intact, no focal abnormalities noted Psych:  Normal affect    Telemetry:  Telemetry was personally reviewed and demonstrates:  Sinus tachcardia  Relevant CV Studies: Cath 7/19 reviewed personally with Dr Tamala Julian  Laboratory Data:  Chemistry Recent Labs  Lab 12/24/17 0451  NA 135  K 3.6  CL 88*  CO2 38*  GLUCOSE 627*  BUN 21  CREATININE 1.05*  CALCIUM 7.8*  GFRNONAA 56*  GFRAA >60  ANIONGAP 9    Recent Labs  Lab 12/24/17 0451  PROT 5.3*  ALBUMIN 2.2*  AST 21  ALT 38  ALKPHOS 93  BILITOT 0.3   Hematology Recent Labs  Lab 12/24/17 0451  WBC 7.6  RBC 2.80*  HGB 8.1*  HCT 26.2*  MCV 93.6  MCH 28.9  MCHC 30.9  RDW 18.1*  PLT 248   Cardiac EnzymesNo results for input(s): TROPONINI in the last 168 hours. No results for input(s):  TROPIPOC in the last 168 hours.  BNPNo results for input(s): BNP, PROBNP in the last 168 hours.  DDimer No results for input(s): DDIMER in the last 168 hours.  Radiology/Studies:  Ct Abdomen Pelvis W Contrast  Result Date: 12/24/2017 CLINICAL DATA:  Rectal bleeding. Recent surgery for small bowel obstruction. EXAM: CT ABDOMEN AND PELVIS WITH CONTRAST TECHNIQUE: Multidetector CT imaging of the abdomen and pelvis was performed using the standard protocol following bolus administration of intravenous contrast. CONTRAST:  13mL OMNIPAQUE IOHEXOL 300 MG/ML  SOLN COMPARISON:  CT 11/27/2017 FINDINGS: Lower chest: Lung bases are clear. Hepatobiliary: No focal hepatic lesion. Postcholecystectomy. No biliary dilatation. Pancreas: Pancreas is normal. No ductal dilatation. No pancreatic inflammation. Spleen: Normal spleen Adrenals/urinary tract: Adrenal glands normal. Nonobstructing calculus in the mid RIGHT kidney measures 2 mm. High-density lesion extending from the posterior aspect of the RIGHT kidney measuring 10 mm (image 37/3). No ureterolithiasis or obstructive uropathy. Bladder normal. Stomach/Bowel: Stomach, and duodenum small bowel are normal. Contrast travels to the terminal ileum. Appendix not identified. Ascending transverse colon are normal. Descending colon and rectosigmoid colon normal. No explanation for rectal bleeding. Vascular/Lymphatic: Abdominal aorta is normal caliber with atherosclerotic calcification. There is no retroperitoneal or periportal lymphadenopathy. No pelvic lymphadenopathy. Reproductive: Post hysterectomy. Other: Interval evacuation of small ventral midline abscess seen on comparison exam. There is an open wound at this site with a (image 35/3.) Oblong high-density collection in the RIGHT lower quadrant subcu tissue measuring 7.8 cm compared to 8.9 cm. Presumably resolving hematoma. Musculoskeletal: No aggressive osseous lesion. IMPRESSION: 1. No explanation for rectal bleeding. No  abnormality of  the colon identified. 2. No bowel obstruction.  Small bowel appears normal. 3. Apparent evacuation of ventral abdominal wall abscess. Open wound remains. 4. Decrease in size of presumed RIGHT groin hematoma. 5. Small exophytic lesion from the LEFT kidney again demonstrated. Indeterminate Electronically Signed   By: Suzy Bouchard M.D.   On: 12/24/2017 09:41    Assessment and Plan:   1. CAD She has known CAD with prior ISR.  She is s/p balloon angioplasty of her RCA 7/19.  She now presents with GI bleeding. I have discussed with Dr Tamala Julian (who performed the procedure) today.  He and I agree that acutely, we will need to hold both ASA and plavix.  Once the cause of her GI bleed is better determined, further recommendations can be made.  2. HTN Stable No change required today  Cardiology to follow and to make further recommendations about antiplatelet therapy once source of GI bleed is determined.   For questions or updates, please contact [place] Please consult [email or web address] for contact info under     Signed, Thompson Grayer, MD  12/24/2017 3:40 PM

## 2017-12-24 NOTE — Consult Note (Signed)
[facility] Consult  Referring Provider: Thayer Jew, MD- ED, admitted under teaching service) Primary Care Physician:  Dortha Kern, PA Primary Gastroenterologist: Josefina Do, [place])  Reason for Consultation:  Rectal bleeding  HPI: Crystal Yates is a 62 y.o. female was in her usual Yates of health until 5 days prior to presentation when she developed painless rectal bleeding described as several episodesof bright red blood and maroon-colored stools several times a day. This was not associated with abdominal pain or rectal pain. She reports having diverticulosis. She reports that her last colonoscopy was about 3 years ago performed in [place] by Dr. Lyndel Safe and was told that she had diverticulosis. 3 weeks ago patient had a small bowel obstruction requiring lysis ofadhesions and wound VAC placement at [facility] by Dr. Coralie Keens. Patient also has a history of perforated diverticulitis requiring colonic resection in 2016. Patient had angioplasty on 10/14/17 and was recommended to antiplatelet therapy indefinitely.   Past Medical History:  Diagnosis Date  . Acute on chronic systolic heart failure exacerbation(HCC) 04/08/2016  . CAD in native artery    a. Prior LAD stenting based on cath. b. RCA stenting 03/2016 x2.  . Chronic combined systolic and diastolic CHF (congestive heart failure) ([place])   . CKD (chronic kidney disease), stage II   . COPD (chronic obstructive pulmonary disease) ([place])   . Diabetes mellitus without complication ([place])   . Dyspnea   . Hashimoto's thyroiditis   . Hyperlipidemia   . Hypertension   . Myocardial infarction ([place])   . On home oxygen therapy    "2L; 24/7" (10/23/2017)  . Secondary adrenal insufficiency ([place])   . Thyroid disease   . Tobacco abuse     Past Surgical History:  Procedure Laterality Date  . ABDOMINAL SURGERY    . CESAREAN SECTION    . CHOLECYSTECTOMY    . COLON RESECTION    . CORONARY BALLOON  ANGIOPLASTY N/A 10/13/2017   Procedure: CORONARY BALLOON ANGIOPLASTY;  Surgeon: Belva Crome, MD;  Location: [facility];  Service: Cardiovascular;  Laterality: N/A;  . HERNIA MESH REMOVAL    . HERNIA REPAIR    . LEFT HEART CATH AND CORONARY ANGIOGRAPHY N/A 10/13/2016   Procedure: Left Heart Cath and Coronary Angiography;  Surgeon: Nelva Bush, MD;  Location: [place] [facility];  Service: Cardiovascular;  Laterality: N/A;  . RIGHT/LEFT HEART CATH AND CORONARY ANGIOGRAPHY N/A 10/13/2017   Procedure: RIGHT/LEFT HEART CATH AND CORONARY ANGIOGRAPHY;  Surgeon: Belva Crome, MD;  Location: [facility];  Service: Cardiovascular;  Laterality: N/A;  . SHOULDER ARTHROSCOPY    . TUBAL LIGATION      Prior to Admission medications   Medication Sig Start Date End Date Taking? Authorizing Provider  acetaminophen (TYLENOL) 325 MG tablet Take 2 tablets (650 mg total) by mouth every 4 (four) hours as needed for headache or mild pain. 10/14/17  Yes Isaiah Serge, NP  albuterol (PROAIR HFA) 108 (90 Base) MCG/ACT inhaler Inhale 2 puffs into the lungs See admin instructions. Inhale 2 puffs into the lungs every 4-6 hours as needed for shortness of breath or wheezing 07/24/17  Yes [provider]  aspirin 81 MG chewable tablet Chew 1 tablet (81 mg total) by mouth daily. 10/14/17  Yes Isaiah Serge, NP  atorvastatin (LIPITOR) 80 MG tablet Take 1 tablet (80 mg total) by mouth daily at 6 PM. 10/14/17  Yes Isaiah Serge, NP  CALCIUM PO Take 1 tablet by mouth daily.  Yes [provider]  Cholecalciferol (VITAMIN D3) 2000 units capsule Take 2,000 Units by mouth daily.    Yes [provider]  clopidogrel (PLAVIX) 75 MG tablet Take 75 mg by mouth daily.   Yes [provider]  DULoxetine (CYMBALTA) 60 MG capsule Take 60 mg by mouth daily.  12/05/16  Yes [provider]  EMGALITY 120 MG/ML SOAJ Inject 120 mg into the skin every 30 (thirty) days.  07/30/17  Yes  [provider]  fludrocortisone (FLORINEF) 0.1 MG tablet Take 1 tablet (0.1 mg total) by mouth 2 (two) times daily. 10/26/17  Yes Neva Seat, MD  HYDROcodone-acetaminophen (NORCO/VICODIN) 5-325 MG tablet Take 1 tablet by mouth every 6 (six) hours as needed for pain. 12/15/17  Yes [provider]  hydrocortisone (CORTEF) 5 MG tablet Take 5-15 mg by mouth See admin instructions. Take 15 mg by mouth in the morning and 5 mg in the afternoon   Yes [provider]  Insulin Aspart, w/Niacinamide, (FIASP FLEXTOUCH) 100 UNIT/ML SOPN Inject 2-14 Units into the skin See admin instructions. Use three times day as needed before meals per sliding scale   Yes [provider]  insulin degludec (TRESIBA FLEXTOUCH) 100 UNIT/ML SOPN FlexTouch Pen Inject 40 Units into the skin at bedtime.  08/28/16  Yes [provider]  ipratropium-albuterol (DUONEB) 0.5-2.5 (3) MG/3ML SOLN Take 3 mLs by nebulization 4 (four) times daily.   Yes [provider]  Iron-FA-B Cmp-C-Biot-Probiotic (FUSION PLUS) CAPS Take 1 capsule by mouth daily. 10/09/16  Yes [provider]  levothyroxine (SYNTHROID, LEVOTHROID) 150 MCG tablet Take 150 mcg by mouth daily before breakfast.   Yes [provider]  Magnesium 400 MG TABS Take 400 mg by mouth daily.    Yes [provider]  nitroGLYCERIN (NITROSTAT) 0.4 MG SL tablet Place 0.4 mg under the tongue every 5 (five) minutes as needed for chest pain.    Yes [provider]  oxybutynin (DITROPAN XL) 15 MG 24 hr tablet Take 15 mg by mouth at bedtime.   Yes [provider]  potassium chloride SA (K-DUR,KLOR-CON) 20 MEQ tablet Take 1 tablet (20 mEq total) by mouth daily. 03/14/17  Yes Park Liter, MD  pramipexole (MIRAPEX) 1 MG tablet Take 1 mg by mouth 2 (two) times daily.   Yes [provider]  promethazine (PHENERGAN) 12.5 MG tablet Take 12.5 mg by mouth every 6 (six) hours as needed for  nausea. 12/12/17  Yes [provider]  torsemide (DEMADEX) 20 MG tablet Take 2 tablets (40 mg total) by mouth 2 (two) times daily. 10/21/17  Yes Alphonzo Grieve, MD  TRELEGY ELLIPTA 100-62.5-25 MCG/INH AEPB Inhale 1 puff into the lungs daily. 08/30/17  Yes [provider]  TRULICITY 1.5 QB/3.4LP SOPN Inject 1.5 mg into the skin every Sunday.  10/10/16  Yes [provider]  dexamethasone (DECADRON) 2 MG tablet Take 1 tablet (2 mg total) by mouth daily. Patient not taking: Reported on 12/24/2017 11/21/17   Dorie Rank, MD  metoprolol succinate (TOPROL-XL) 25 MG 24 hr tablet Take 0.5 tablets (12.5 mg total) by mouth daily. 10/27/17   Ledell Noss, MD  ondansetron (ZOFRAN ODT) 4 MG disintegrating tablet Take 1 tablet (4 mg total) by mouth every 8 (eight) hours as needed for nausea or vomiting. Patient not taking: Reported on 12/24/2017 12/10/17   Fransico Meadow, PA-C  ondansetron (ZOFRAN ODT) 4 MG disintegrating tablet Take 1 tablet (4 mg total) by mouth every 8 (eight)  hours as needed for nausea or vomiting. Patient not taking: Reported on 12/24/2017 12/10/17   Fransico Meadow, PA-C  oxyCODONE (OXY IR/ROXICODONE) 5 MG immediate release tablet Take 5 mg by mouth every 4 (four) hours as needed for pain. 12/08/17   [provider]  traMADol (ULTRAM) 50 MG tablet Take 1 tablet (50 mg total) by mouth every 6 (six) hours as needed. Patient not taking: Reported on 12/10/2017 11/21/17   Dorie Rank, MD    Current Facility-Administered Medications  Medication Dose Route Frequency Provider Last Rate Last Dose  . 0.9 %  sodium chloride infusion  10 mL/hr Intravenous Once Horton, Barbette Hair, MD      . dextrose 5 %-0.45 % sodium chloride infusion   Intravenous Continuous Horton, Barbette Hair, MD      . insulin regular (NOVOLIN R,HUMULIN R) 100 Units in sodium chloride 0.9 % 100 mL (1 Units/mL) infusion   Intravenous Continuous Horton, Barbette Hair, MD 4.2 mL/hr at 12/24/17 1012 4.2 Units/hr at  12/24/17 1012   Current Outpatient Medications  Medication Sig Dispense Refill  . acetaminophen (TYLENOL) 325 MG tablet Take 2 tablets (650 mg total) by mouth every 4 (four) hours as needed for headache or mild pain.    Marland Kitchen albuterol (PROAIR HFA) 108 (90 Base) MCG/ACT inhaler Inhale 2 puffs into the lungs See admin instructions. Inhale 2 puffs into the lungs every 4-6 hours as needed for shortness of breath or wheezing    . aspirin 81 MG chewable tablet Chew 1 tablet (81 mg total) by mouth daily.    Marland Kitchen atorvastatin (LIPITOR) 80 MG tablet Take 1 tablet (80 mg total) by mouth daily at 6 PM. 30 tablet 6  . CALCIUM PO Take 1 tablet by mouth daily.    . Cholecalciferol (VITAMIN D3) 2000 units capsule Take 2,000 Units by mouth daily.     . clopidogrel (PLAVIX) 75 MG tablet Take 75 mg by mouth daily.    . DULoxetine (CYMBALTA) 60 MG capsule Take 60 mg by mouth daily.   0  . EMGALITY 120 MG/ML SOAJ Inject 120 mg into the skin every 30 (thirty) days.   3  . fludrocortisone (FLORINEF) 0.1 MG tablet Take 1 tablet (0.1 mg total) by mouth 2 (two) times daily. 60 tablet 2  . HYDROcodone-acetaminophen (NORCO/VICODIN) 5-325 MG tablet Take 1 tablet by mouth every 6 (six) hours as needed for pain.  0  . hydrocortisone (CORTEF) 5 MG tablet Take 5-15 mg by mouth See admin instructions. Take 15 mg by mouth in the morning and 5 mg in the afternoon    . Insulin Aspart, w/Niacinamide, (FIASP FLEXTOUCH) 100 UNIT/ML SOPN Inject 2-14 Units into the skin See admin instructions. Use three times day as needed before meals per sliding scale    . insulin degludec (TRESIBA FLEXTOUCH) 100 UNIT/ML SOPN FlexTouch Pen Inject 40 Units into the skin at bedtime.     Marland Kitchen ipratropium-albuterol (DUONEB) 0.5-2.5 (3) MG/3ML SOLN Take 3 mLs by nebulization 4 (four) times daily.    . Iron-FA-B Cmp-C-Biot-Probiotic (FUSION PLUS) CAPS Take 1 capsule by mouth daily.  5  . levothyroxine (SYNTHROID, LEVOTHROID) 150 MCG tablet Take 150 mcg by mouth  daily before breakfast.    . Magnesium 400 MG TABS Take 400 mg by mouth daily.     . nitroGLYCERIN (NITROSTAT) 0.4 MG SL tablet Place 0.4 mg under the tongue every 5 (five) minutes as needed for chest pain.     Marland Kitchen oxybutynin (DITROPAN XL) 15  MG 24 hr tablet Take 15 mg by mouth at bedtime.    . potassium chloride SA (K-DUR,KLOR-CON) 20 MEQ tablet Take 1 tablet (20 mEq total) by mouth daily. 90 tablet 3  . pramipexole (MIRAPEX) 1 MG tablet Take 1 mg by mouth 2 (two) times daily.    . promethazine (PHENERGAN) 12.5 MG tablet Take 12.5 mg by mouth every 6 (six) hours as needed for nausea.  0  . torsemide (DEMADEX) 20 MG tablet Take 2 tablets (40 mg total) by mouth 2 (two) times daily. 60 tablet 1  . TRELEGY ELLIPTA 100-62.5-25 MCG/INH AEPB Inhale 1 puff into the lungs daily.  3  . TRULICITY 1.5 YI/5.0YD SOPN Inject 1.5 mg into the skin every Sunday.   4  . dexamethasone (DECADRON) 2 MG tablet Take 1 tablet (2 mg total) by mouth daily. (Patient not taking: Reported on 12/24/2017) 5 tablet 0  . metoprolol succinate (TOPROL-XL) 25 MG 24 hr tablet Take 0.5 tablets (12.5 mg total) by mouth daily. 30 tablet 2  . ondansetron (ZOFRAN ODT) 4 MG disintegrating tablet Take 1 tablet (4 mg total) by mouth every 8 (eight) hours as needed for nausea or vomiting. (Patient not taking: Reported on 12/24/2017) 20 tablet 0  . ondansetron (ZOFRAN ODT) 4 MG disintegrating tablet Take 1 tablet (4 mg total) by mouth every 8 (eight) hours as needed for nausea or vomiting. (Patient not taking: Reported on 12/24/2017) 20 tablet 0  . oxyCODONE (OXY IR/ROXICODONE) 5 MG immediate release tablet Take 5 mg by mouth every 4 (four) hours as needed for pain.  0  . traMADol (ULTRAM) 50 MG tablet Take 1 tablet (50 mg total) by mouth every 6 (six) hours as needed. (Patient not taking: Reported on 12/10/2017) 15 tablet 0    Allergies as of 12/24/2017 - Review Complete 12/24/2017  Allergen Reaction Noted  . Hydroxychloroquine Shortness Of  Breath, Nausea Only, and Other (See Comments) 11/21/2013  . Donepezil Other (See Comments) 10/16/2013  . Prednisone Other (See Comments) 09/02/2013  . Anticoagulant cit dext [acd formula a] Other (See Comments) 11/03/2017  . Bupropion Other (See Comments) 07/15/2016  . Metrizamide Other (See Comments) 10/05/2016  . Varenicline Other (See Comments) 07/15/2016  . Tape Rash and Other (See Comments) 10/12/2016    Family History  Problem Relation Age of Onset  . Stroke Mother   . Diabetes Father   . Diabetes Sister   . Diabetes Sister   . Diabetes Son     Social History   Socioeconomic History  . Marital status: Married    Spouse name: Not on file  . Number of children: Not on file  . Years of education: Not on file  . Highest education level: Not on file  Occupational History  . Not on file  Social Needs  . Financial resource strain: Not on file  . Food insecurity:    Worry: Not on file    Inability: Not on file  . Transportation needs:    Medical: Not on file    Non-medical: Not on file  Tobacco Use  . Smoking status: Current Every Day Smoker    Packs/day: 0.75    Years: 44.00    Pack years: 33.00    Types: Cigarettes  . Smokeless tobacco: Never Used  Substance and Sexual Activity  . Alcohol use: Yes    Alcohol/week: 7.0 standard drinks    Types: 7 Shots of liquor per week  . Drug use: Yes    Types: Marijuana  Comment: 10/23/2017 "had 1 ~ 1 month ago; nothing before or since"  . Sexual activity: Yes  Lifestyle  . Physical activity:    Days per week: Not on file    Minutes per session: Not on file  . Stress: Not on file  Relationships  . Social connections:    Talks on phone: Not on file    Gets together: Not on file    Attends religious service: Not on file    Active member of club or organization: Not on file    Attends meetings of clubs or organizations: Not on file    Relationship status: Not on file  . Intimate partner violence:    Fear of current  or ex partner: Not on file    Emotionally abused: Not on file    Physically abused: Not on file    Forced sexual activity: Not on file  Other Topics Concern  . Not on file  Social History Narrative  . Not on file    Review of Systems: Positive for: GI: Described in detail in HPI.    Gen: Denies any fever, chills, rigors, night sweats, anorexia, fatigue, weakness, malaise, involuntary weight loss, and sleep disorder CV: Denies chest pain, angina, palpitations, syncope, orthopnea, PND, peripheral edema, and claudication. Resp: Denies dyspnea, cough, sputum, wheezing, coughing up blood. GU : Denies urinary burning, blood in urine, urinary frequency, urinary hesitancy, nocturnal urination, and urinary incontinence. MS: Denies joint pain or swelling.  Denies muscle weakness, cramps, atrophy.  Derm: Denies rash, itching, oral ulcerations, hives, unhealing ulcers.  Psych: Denies depression, anxiety, memory loss, suicidal ideation, hallucinations,  and confusion. Heme: rectal bleeding,Denies bruising and enlarged lymph nodes. Neuro:  dizziness, Denies any headaches, paresthesias. Endo:  DM, thyroid,Denies any problems with adrenal function.  Physical Exam: Vital signs in last 24 hours: Temp:  [97.5 F (36.4 C)-97.6 F (36.4 C)] 97.5 F (36.4 C) (09/29 0911) Pulse Rate:  [96-107] 96 (09/29 1000) Resp:  [18-22] 18 (09/29 1000) BP: (101-128)/(60-95) 120/70 (09/29 1000) SpO2:  [94 %-100 %] 100 % (09/29 1000)    General:   Alert,  Well-developed,obese, cooperative in NAD Head:  Normocephalic and atraumatic. Eyes:  Mild pallor Ears:  Normal auditory acuity. Nose:  No deformity, discharge,  or lesions. Mouth:  No deformity or lesions.  Oropharynx pink & moist. Neck:  Supple; no masses or thyromegaly. Lungs:  Clear throughout to auscultation.   No wheezes, crackles, or rhonchi. No acute distress. Heart:  Regular rate and rhythm; no murmurs, clicks, rubs,  or gallops. Extremities:   Bilateral pitting pedal edema. Neurologic:  Alert and  oriented x4;  grossly normal neurologically. Skin:  Intact without significant lesions or rashes. Psych:  Alert and cooperative. Normal mood and affect. Abdomen:  Mid abdomen wound vac in place, has erythema near surgical incision site, hypoactive bowel sounds, mild generalized tenderness         Lab Results: Recent Labs    12/24/17 0451  WBC 7.6  HGB 8.1*  HCT 26.2*  PLT 248   BMET Recent Labs    12/24/17 0451  NA 135  K 3.6  CL 88*  CO2 38*  GLUCOSE 627*  BUN 21  CREATININE 1.05*  CALCIUM 7.8*   LFT Recent Labs    12/24/17 0451  PROT 5.3*  ALBUMIN 2.2*  AST 21  ALT 38  ALKPHOS 93  BILITOT 0.3   PT/INR No results for input(s): LABPROT, INR in the last 72 hours.  Studies/Results: Ct  Abdomen Pelvis W Contrast  Result Date: 12/24/2017 CLINICAL DATA:  Rectal bleeding. Recent surgery for small bowel obstruction. EXAM: CT ABDOMEN AND PELVIS WITH CONTRAST TECHNIQUE: Multidetector CT imaging of the abdomen and pelvis was performed using the standard protocol following bolus administration of intravenous contrast. CONTRAST:  15mL OMNIPAQUE IOHEXOL 300 MG/ML  SOLN COMPARISON:  CT 11/27/2017 FINDINGS: Lower chest: Lung bases are clear. Hepatobiliary: No focal hepatic lesion. Postcholecystectomy. No biliary dilatation. Pancreas: Pancreas is normal. No ductal dilatation. No pancreatic inflammation. Spleen: Normal spleen Adrenals/urinary tract: Adrenal glands normal. Nonobstructing calculus in the mid RIGHT kidney measures 2 mm. High-density lesion extending from the posterior aspect of the RIGHT kidney measuring 10 mm (image 37/3). No ureterolithiasis or obstructive uropathy. Bladder normal. Stomach/Bowel: Stomach, and duodenum small bowel are normal. Contrast travels to the terminal ileum. Appendix not identified. Ascending transverse colon are normal. Descending colon and rectosigmoid colon normal. No explanation for rectal  bleeding. Vascular/Lymphatic: Abdominal aorta is normal caliber with atherosclerotic calcification. There is no retroperitoneal or periportal lymphadenopathy. No pelvic lymphadenopathy. Reproductive: Post hysterectomy. Other: Interval evacuation of small ventral midline abscess seen on comparison exam. There is an open wound at this site with a (image 35/3.) Oblong high-density collection in the RIGHT lower quadrant subcu tissue measuring 7.8 cm compared to 8.9 cm. Presumably resolving hematoma. Musculoskeletal: No aggressive osseous lesion. IMPRESSION: 1. No explanation for rectal bleeding. No abnormality of the colon identified. 2. No bowel obstruction.  Small bowel appears normal. 3. Apparent evacuation of ventral abdominal wall abscess. Open wound remains. 4. Decrease in size of presumed RIGHT groin hematoma. 5. Small exophytic lesion from the LEFT kidney again demonstrated. Indeterminate Electronically Signed   By: Suzy Bouchard M.D.   On: 12/24/2017 09:41    Impression: Painless hematochezia with hemoglobin 8.1 on presentation,hemoglobin was 13 on 12/10/17 Grossly bloody bowel movement at bedside documented in ER  Ventral abdominal wall abscesses and open wound with wound VAC in place Right groin hematoma Coronary artery disease on aspirin as well as Plavix Chronic kidney disease BUN/creatinine /Creatinine 1.05/GFR 56 Elevated blood sugar of 627, history of diabetes  Plan: Possible diverticular bleeding, remains slightly tachycardic but not hypotensive. Plan to transfuse 2 units PRBC If ongoing bleeding noted patient will benefit from a bleeding scan and embolization by IR Recommend following up with cardiology as both aspirin and Plavix will likely need to be on hold with active ongoing bleeding. Would refrain from colonoscopy as patient recently had abdominal/small bowel surgery less than 3 weeks ago and has a wound VAC in place. Recommend monitoring H&H and transfusing as needed.     LOS: 0 days   Ronnette Juniper, M.D. 12/24/2017, 11:06 AM  Pager [phone] If no answer or after 5 PM call [phone]

## 2017-12-24 NOTE — ED Provider Notes (Signed)
[facility] EMERGENCY DEPARTMENT Provider Note   CSN: [id number] Arrival date & time: 12/24/17  0441     History   Chief Complaint Chief Complaint  Patient presents with  . Rectal Bleeding    HPI Crystal Yates is a 62 y.o. female.  HPI  This is a 62 year old female who presents with rectal bleeding.  Patient reports 5-day history of bright red blood per rectum.  She reports multiple episodes of bloody bowel movements with clots and bright red blood.  She has not noted any bloody emesis or coffee-ground emesis.  She denies any significant pain.  Patient has a history of bowel resection.  She had lysis of adhesions in early September at [facility].  She has a wound VAC in place.  She states she informed her surgeon about her rectal bleeding but was told "this is not related to surgery."  Patient does report some dizziness.  No shortness of breath.  Denies syncope.  She is not on any blood thinners.  Patient had lysis of adhesions at [facility] by Dr. Coralie Keens.  She subsequently had a wound VAC placed over her anterior abdominal incision.  This is still in place.  Past Medical History:  Diagnosis Date  . Acute on chronic systolic heart failure exacerbation(HCC) 04/08/2016  . CAD in native artery    a. Prior LAD stenting based on cath. b. RCA stenting 03/2016 x2.  . Chronic combined systolic and diastolic CHF (congestive heart failure) ([place])   . CKD (chronic kidney disease), stage II   . COPD (chronic obstructive pulmonary disease) ([place])   . Diabetes mellitus without complication ([place])   . Dyspnea   . Hashimoto's thyroiditis   . Hyperlipidemia   . Hypertension   . Myocardial infarction ([place])   . On home oxygen therapy    "2L; 24/7" (10/23/2017)  . Secondary adrenal insufficiency ([place])   . Thyroid disease   . Tobacco abuse     Patient Active Problem List   Diagnosis Date Noted  . Coronary stent restenosis due to progression of disease    . Volume overload   . Pseudoaneurysm following procedure ([place])   . Pseudoaneurysm ([place]) 10/17/2017  . S/P angioplasty for ISR of dRCA 10/13/17 10/14/2017  . CAD (coronary artery disease) 10/13/2017  . Preop examination 10/10/2017  . Acute respiratory distress 09/16/2017  . Right upper lobe pneumonia ([place]) 05/06/2017  . Pneumonia due to respiratory syncytial virus (RSV) 04/21/2017  . Entrapment, radial nerve, right 03/31/2017  . COPD exacerbation ([place]) 10/14/2016  . Chronic combined systolic and diastolic CHF (congestive heart failure) ([place]) 10/13/2016  . Chest tightness or pressure 10/12/2016  . Dyspnea on exertion 09/29/2016  . History of coronary artery disease 09/29/2016  . OAB (overactive bladder) 09/21/2016  . Neck pain 09/08/2016  . Paresthesia of arm 09/08/2016  . Flank pain 08/31/2016  . Oxygen dependent 06/20/2016  . Neuralgia of right upper extremity 04/26/2016  . Coronary artery disease 04/16/2016  . Lymphocele after surgical procedure 04/16/2016  . Ventricular tachycardia (paroxysmal) ([place]) 04/09/2016  . Acute on chronic systolic heart failure exacerbation(HCC) 04/08/2016  . Ischemic cardiomyopathy 04/08/2016  . Type 2 diabetes mellitus without complication, with long-term current use of insulin ([place]) 04/08/2016  . Anemia, blood loss 09/16/2015  . GI bleeding 09/16/2015  . Chest pain 05/22/2015  . Hypokalemia 05/22/2015  . Hypothyroid 05/22/2015  . Status post hernia repair 01/29/2015  . Surgical wound breakdown 01/29/2015  . Hernia with strangulation 01/18/2015  .  Tobacco abuse 01/18/2015  . Recurrent incisional hernia with incarceration 01/15/2015  . Sleep apnea 01/15/2015  . S/P drug eluting coronary stent placement 10/16/2014  . Adrenal insufficiency ([place]) 11/21/2013  . Anemia 11/21/2013  . Unstable angina ([place]) 11/21/2013  . Atherosclerotic heart disease of native coronary artery without angina pectoris 11/21/2013  . Cardiomyopathy ([place]) 11/21/2013  .  Congestive heart failure ([place]) 11/21/2013  . Diabetes mellitus with no complication ([place]) 48/54/6270  . Edema 11/21/2013  . Empty sella syndrome ([place]) 11/21/2013  . Hepatitis 11/21/2013  . Hyperlipemia 11/21/2013  . Hypertension 11/21/2013  . Kidney cyst, acquired 11/21/2013  . Lymph nodes enlarged 11/21/2013  . Fat necrosis 11/21/2013  . Pulmonary emphysema ([place]) 11/21/2013  . Renal disorder 11/21/2013  . Shortness of breath 11/21/2013  . Sjogrens syndrome ([place]) 11/21/2013  . Thyroid disorder 11/21/2013  . Nicotine dependence, uncomplicated 35/00/9381  . Major depressive disorder, recurrent episode ([place]) 09/17/2013  . Displacement of lumbar intervertebral disc 06/14/2013  . Intractable migraine without aura 06/14/2013  . Mild cognitive impairment 06/14/2013  . Polyneuropathy in diabetes ([place]) 06/14/2013  . Secondary adrenal insufficiency ([place]) 04/12/2011  . DDD (degenerative disc disease), lumbosacral 01/11/2008  . Thoracic or lumbosacral neuritis or radiculitis 01/11/2008    Past Surgical History:  Procedure Laterality Date  . ABDOMINAL SURGERY    . CESAREAN SECTION    . CHOLECYSTECTOMY    . COLON RESECTION    . CORONARY BALLOON ANGIOPLASTY N/A 10/13/2017   Procedure: CORONARY BALLOON ANGIOPLASTY;  Surgeon: Belva Crome, MD;  Location: [facility] CV LAB;  Service: Cardiovascular;  Laterality: N/A;  . HERNIA MESH REMOVAL    . HERNIA REPAIR    . LEFT HEART CATH AND CORONARY ANGIOGRAPHY N/A 10/13/2016   Procedure: Left Heart Cath and Coronary Angiography;  Surgeon: Nelva Bush, MD;  Location: [facility];  Service: Cardiovascular;  Laterality: N/A;  . RIGHT/LEFT HEART CATH AND CORONARY ANGIOGRAPHY N/A 10/13/2017   Procedure: RIGHT/LEFT HEART CATH AND CORONARY ANGIOGRAPHY;  Surgeon: Belva Crome, MD;  Location: [facility];  Service: Cardiovascular;  Laterality: N/A;  . SHOULDER ARTHROSCOPY    . TUBAL LIGATION       OB History   None      Home  Medications    Prior to Admission medications   Medication Sig Start Date End Date Taking? Authorizing Provider  acetaminophen (TYLENOL) 325 MG tablet Take 2 tablets (650 mg total) by mouth every 4 (four) hours as needed for headache or mild pain. 10/14/17   Isaiah Serge, NP  albuterol [facility] HFA) 108 (90 Base) MCG/ACT inhaler Inhale 2 puffs into the lungs See admin instructions. Inhale 2 puffs into the lungs every 4-6 hours as needed for shortness of breath or wheezing 07/24/17   [provider]  aspirin 81 MG chewable tablet Chew 1 tablet (81 mg total) by mouth daily. 10/14/17   Isaiah Serge, NP  atorvastatin (LIPITOR) 80 MG tablet Take 1 tablet (80 mg total) by mouth daily at 6 PM. 10/14/17   Isaiah Serge, NP  CALCIUM PO Take 1 tablet by mouth daily.    [provider]  Cholecalciferol (VITAMIN D3) 2000 units capsule Take 2,000 Units by mouth daily.     [provider]  clopidogrel (PLAVIX) 75 MG tablet Take 75 mg by mouth daily.    [provider]  dexamethasone (DECADRON) 2 MG tablet Take 1 tablet (2 mg total) by mouth daily. 11/21/17   [name],  Wille Glaser, MD  DULoxetine (CYMBALTA) 60 MG capsule Take 60 mg by mouth daily.  12/05/16   [provider]  EMGALITY 120 MG/ML SOAJ Inject 120 mg into the skin every 30 (thirty) days.  07/30/17   [provider]  fludrocortisone (FLORINEF) 0.1 MG tablet Take 1 tablet (0.1 mg total) by mouth 2 (two) times daily. 10/26/17   Neva Seat, MD  hydrocortisone (CORTEF) 5 MG tablet Take 5-15 mg by mouth See admin instructions. Take 15 mg by mouth in the morning and 5 mg in the afternoon    [provider]  Insulin Aspart, w/Niacinamide, (FIASP FLEXTOUCH) 100 UNIT/ML SOPN Inject 2-14 Units into the skin See admin instructions. Use three times day as needed before meals per sliding scale    [provider]  insulin degludec (TRESIBA FLEXTOUCH) 100 UNIT/ML SOPN FlexTouch Pen Inject 40 Units into  the skin at bedtime.  08/28/16   [provider]  Iron-FA-B Cmp-C-Biot-Probiotic (FUSION PLUS) CAPS Take 1 capsule by mouth daily. 10/09/16   [provider]  levothyroxine (SYNTHROID, LEVOTHROID) 150 MCG tablet Take 150 mcg by mouth daily before breakfast.    [provider]  Magnesium 250 MG TABS Take 250 mg by mouth daily.    [provider]  metoprolol succinate (TOPROL-XL) 25 MG 24 hr tablet Take 0.5 tablets (12.5 mg total) by mouth daily. 10/27/17   Ledell Noss, MD  nitroGLYCERIN (NITROSTAT) 0.4 MG SL tablet Place 0.4 mg under the tongue every 5 (five) minutes as needed for chest pain.     [provider]  ondansetron (ZOFRAN ODT) 4 MG disintegrating tablet Take 1 tablet (4 mg total) by mouth every 8 (eight) hours as needed for nausea or vomiting. 12/10/17   Fransico Meadow, PA-C  ondansetron (ZOFRAN ODT) 4 MG disintegrating tablet Take 1 tablet (4 mg total) by mouth every 8 (eight) hours as needed for nausea or vomiting. 12/10/17   Fransico Meadow, PA-C  oxybutynin (DITROPAN XL) 15 MG 24 hr tablet Take 15 mg by mouth at bedtime.    [provider]  oxyCODONE (OXY IR/ROXICODONE) 5 MG immediate release tablet Take 5 mg by mouth every 4 (four) hours as needed for pain. 12/08/17   [provider]  potassium chloride SA (K-DUR,KLOR-CON) 20 MEQ tablet Take 1 tablet (20 mEq total) by mouth daily. 03/14/17   Park Liter, MD  pramipexole (MIRAPEX) 1 MG tablet Take 1 mg by mouth 2 (two) times daily.    [provider]  Semaglutide (OZEMPIC) 0.25 or 0.5 MG/DOSE SOPN Inject 0.5 mg into the skin every 30 (thirty) days.     [provider]  torsemide (DEMADEX) 20 MG tablet Take 2 tablets (40 mg total) by mouth 2 (two) times daily. 10/21/17   Alphonzo Grieve, MD  traMADol (ULTRAM) 50 MG tablet Take 1 tablet (50 mg total) by mouth every 6 (six) hours as needed. Patient not taking: Reported on 12/10/2017 11/21/17   Dorie Rank, MD    TRELEGY ELLIPTA 100-62.5-25 MCG/INH AEPB Inhale 1 puff into the lungs daily. 08/30/17   [provider]  TRULICITY 1.5 QZ/0.0PQ SOPN Inject 1.5 mg into the skin every Sunday.  10/10/16   [provider]    Family History Family History  Problem Relation Age of Onset  . Stroke Mother   . Diabetes Father   . Diabetes Sister   . Diabetes Sister   . Diabetes Son     Social History Social History  Tobacco Use  . Smoking status: Current Every Day Smoker    Packs/day: 0.75    Years: 44.00    Pack years: 33.00    Types: Cigarettes  . Smokeless tobacco: Never Used  Substance Use Topics  . Alcohol use: Yes    Alcohol/week: 7.0 standard drinks    Types: 7 Shots of liquor per week  . Drug use: Yes    Types: Marijuana    Comment: 10/23/2017 "had 1 ~ 1 month ago; nothing before or since"     Allergies   Hydroxychloroquine; Donepezil; Prednisone; Anticoagulant cit dext [acd formula a]; Bupropion; Metrizamide; Varenicline; and Tape   Review of Systems Review of Systems  Constitutional: Negative for fever.  Respiratory: Negative for shortness of breath.   Cardiovascular: Negative for chest pain.  Gastrointestinal: Positive for blood in stool. Negative for abdominal pain, diarrhea, nausea and vomiting.  Genitourinary: Negative for dysuria.  Neurological: Positive for dizziness and light-headedness. Negative for syncope.  All other systems reviewed and are negative.    Physical Exam Updated Vital Signs BP 101/61   Pulse (!) 106   Temp 97.6 F (36.4 C)   Resp (!) 22   SpO2 100%   Physical Exam  Constitutional: She is oriented to person, place, and time. She appears well-developed and well-nourished.  Overweight, nontoxic-appearing, no acute distress  HENT:  Head: Normocephalic and atraumatic.  Eyes: Pupils are equal, round, and reactive to light.  Cardiovascular: Regular rhythm and normal heart sounds.  Tachycardia  Pulmonary/Chest: Effort normal and  breath sounds normal. No respiratory distress. She has no wheezes.  Abdominal: Soft. Bowel sounds are normal.  Hyperactive bowel sounds, wound VAC in place, no significant erythema, no tenderness to palpation on exam, grossly bloody bowel movement at the bedside  Musculoskeletal: She exhibits edema.  2+ pitting bilateral lower extremity edema  Neurological: She is alert and oriented to person, place, and time.  Skin: Skin is warm and dry.  Psychiatric: She has a normal mood and affect.  Nursing note and vitals reviewed.    ED Treatments / Results  Labs (all labs ordered are listed, but only abnormal results are displayed) Labs Reviewed  COMPREHENSIVE METABOLIC PANEL - Abnormal; Notable for the following components:      Result Value   Chloride 88 (*)    CO2 38 (*)    Glucose, Bld 627 (*)    Creatinine, Ser 1.05 (*)    Calcium 7.8 (*)    Total Protein 5.3 (*)    Albumin 2.2 (*)    GFR calc non Af Amer 56 (*)    All other components within normal limits  CBC - Abnormal; Notable for the following components:   RBC 2.80 (*)    Hemoglobin 8.1 (*)    HCT 26.2 (*)    RDW 18.1 (*)    All other components within normal limits  POC OCCULT BLOOD, ED - Abnormal; Notable for the following components:   Fecal Occult Bld POSITIVE (*)    All other components within normal limits  CBG MONITORING, ED - Abnormal; Notable for the following components:   Glucose-Capillary 522 (*)    All other components within normal limits  I-STAT CG4 LACTIC ACID, ED  TYPE AND SCREEN  PREPARE RBC (CROSSMATCH)    EKG None  Radiology No results found.  Procedures Procedures (including critical care time)  CRITICAL CARE Performed by: Merryl Hacker   Total critical care time: 60 minutes  Critical care time was exclusive  of separately billable procedures and treating other patients.  Critical care was necessary to treat or prevent imminent or life-threatening deterioration.  Critical care  was time spent personally by me on the following activities: development of treatment plan with patient and/or surrogate as well as nursing, discussions with consultants, evaluation of patient's response to treatment, examination of patient, obtaining history from patient or surrogate, ordering and performing treatments and interventions, ordering and review of laboratory studies, ordering and review of radiographic studies, pulse oximetry and re-evaluation of patient's condition.   Medications Ordered in ED Medications  sodium chloride 0.9 % bolus 1,000 mL (1,000 mLs Intravenous New Bag/Given 12/24/17 0652)  insulin regular (NOVOLIN R,HUMULIN R) 100 Units in sodium chloride 0.9 % 100 mL (1 Units/mL) infusion (4.6 Units/hr Intravenous New Bag/Given 12/24/17 0651)  dextrose 5 %-0.45 % sodium chloride infusion (has no administration in time range)  0.9 %  sodium chloride infusion (has no administration in time range)  sodium chloride 0.9 % bolus 500 mL (0 mLs Intravenous Stopped 12/24/17 0649)     Initial Impression / Assessment and Plan / ED Course  I have reviewed the triage vital signs and the nursing notes.  Pertinent labs & imaging results that were available during my care of the patient were reviewed by me and considered in my medical decision making (see chart for details).     Patient presents with bright red blood per rectum.  History of bowel resection and recent history of lysis of adhesions.  She has had bright red blood per rectum for 5 days.  She has a bloody bowel movement at the bedside.  She is mildly tachycardic.  Blood pressure 101/61.  She is otherwise nontoxic-appearing.  Abdomen is fairly benign.  She does have a wound VAC to the abdomen.  However it is nontender.  Denies any fevers or significant pain.  Patient made n.p.o.  Patient was typed and screened.  Hemoglobin is 8.  Down from normal.  Also notably hyperglycemic.  She reports she did not take her insulin last night.   Patient was placed on glucose stabilizer.  She was initially given 1 L of fluids.  Last echo with normal EF although she does have a significant history of volume overload.  2 units of blood were ordered as she continues to bleed and will likely become more dilute with volume resuscitation.  Given history of resection and recent lysis of adhesion, feel it pertinent to rule out ischemic gut as etiology of her bleed.  CT scan is pending.  On my last evaluation, she is hemodynamically stable.  Will likely need GI evaluation and admission for transfusion and further work-up.  Final Clinical Impressions(s) / ED Diagnoses   Final diagnoses:  Lower GI bleed  Hyperglycemia    ED Discharge Orders    None       Merryl Hacker, MD 12/24/17 [phone]

## 2017-12-24 NOTE — Progress Notes (Signed)
Patient was suppose to have the home care nurse change her wound vac dressing tomorrow. It will needed to be changed tomorrow.

## 2017-12-24 NOTE — ED Triage Notes (Signed)
Pt reports that for the past 5 days she has been having rectal bleeding, bright red, hx of hemorrhoids and diverticulitis. Pt has wound vac from recent bowel obstruction. Now having generalized weakness and increased SOB, wears 2L all the time

## 2017-12-24 NOTE — Progress Notes (Signed)
Trelegy inhaler is at bedside Pharamacist stated that we need to keep at bedside and can still administer it.

## 2017-12-24 NOTE — H&P (Addendum)
Date: 12/24/2017               Patient Name:  Crystal Yates MRN: [id number]  DOB: 02-Sep-1955 Age / Sex: 62 y.o., female   PCP: Dortha Kern, PA              Medical Service: Internal Medicine Teaching Service              Attending Physician: Dr. Bartholomew Crews, MD    First Contact: Nena Polio, [place] 3 Pager: [phone]  Second Contact: Dr. Gilberto Better Pager: [phone]  Third Contact Dr. Ina Homes Pager: [phone]       After Hours (After 5p/  First Contact Pager: [phone]  weekends / holidays): Second Contact Pager: [phone]   Chief Complaint: Rectal bleeding  History of Present Illness: Crystal Yates is a 62 yo female with a past medical history of laparotomy w/lysis of adhesions and release of SBO on 11/30/17 (Wound VAC in place), diverticulitis s/p bowel resection (2016), hemorrhoids, CAD, LAD and RCA stents (on Plavix), CHF, diabetes, CKD stage II, secondary adrenal insufficiency, COPD (on 2L [place] at home), HTN, and tobacco use, who presented to the ED on 09/29 with a five day history of rectal bleeding. She informed the surgeon who performed her laparotomy of the rectal bleeding, and he told her that this was not related to the surgery and that she should go to the ED. For the past five days, she has had bright red blood per rectum every time she has a bowel movement. She also notes some dark blood and blood clots. She complains of weakness and fatigue, dizziness, and lightheadedness. She denies syncope. During the episodes of bleeding, she comments that she may start to panic, see white spots, feel dizzy, and have difficulty breathing. She denies abdominal pain, pain with bowel movements, and pain with eating. She denies diarrhea and constipation, but notes having "runny stools". She has not taken her Plavix in a couple days. She reports using Tylenol occassionally as needed for pain. Otherwise, she denies fevers, chills, chest pain, and N/V.   Of note, she has a  history of hemorrhoids, but after several episodes of BRBPR, she realized this was different than her hemorrhoids. She notes much more blood and that wiping with toilet paper doesn't stop the bleeding, as it does with hemorrhoids. She tried taking Peptobismol, as this normally helps with her hemorrhoids, but this did not alleviate the bleeding.   In the ED, she was afebrile, tachycardic, tachypnic, with soft BPs (101/61), and satting at 98-100% on 2L [place]. She was put on a clear liquid diet, typed and screened, given 1L NS, and 2L blood. Her BP improved with fluids. She was also found to be hyperglycemic (BG 627), as she had not taken her insulin last night, and was started on insulin infusion.  Meds: Current Facility-Administered Medications  Medication Dose Route Frequency Provider Last Rate Last Dose  . 0.9 %  sodium chloride infusion  10 mL/hr Intravenous Once Neva Seat, MD      . 0.9 %  sodium chloride infusion  250 mL Intravenous PRN Neva Seat, MD      . acetaminophen (TYLENOL) tablet 650 mg  650 mg Oral Q6H PRN Neva Seat, MD       Or  . acetaminophen (TYLENOL) suppository 650 mg  650 mg Rectal Q6H PRN Neva Seat, MD      . atorvastatin (LIPITOR) tablet 80 mg  80 mg Oral q1800 Neva Seat, MD      .  dextrose 5 %-0.45 % sodium chloride infusion   Intravenous Continuous Neva Seat, MD 75 mL/hr at 12/24/17 1207    . DULoxetine (CYMBALTA) DR capsule 60 mg  60 mg Oral Daily Neva Seat, MD      . fludrocortisone (FLORINEF) tablet 0.1 mg  0.1 mg Oral BID Neva Seat, MD      . Fluticasone-Umeclidin-Vilant 100-62.5-25 MCG/INH AEPB 1 puff  1 puff Inhalation Daily Neva Seat, MD      . hydrocortisone (CORTEF) tablet 5-15 mg  5-15 mg Oral See admin instructions Neva Seat, MD      . insulin aspart (novoLOG) injection 0-15 Units  0-15 Units Subcutaneous TID WC Neva Seat, MD      . insulin aspart (novoLOG) injection 0-5 Units   0-5 Units Subcutaneous QHS Neva Seat, MD      . insulin glargine (LANTUS) injection 30 Units  30 Units Subcutaneous Daily Neva Seat, MD   30 Units at 12/24/17 1320  . insulin regular (NOVOLIN R,HUMULIN R) 100 Units in sodium chloride 0.9 % 100 mL (1 Units/mL) infusion   Intravenous Continuous Neva Seat, MD   Stopped at 12/24/17 1217  . ipratropium-albuterol (DUONEB) 0.5-2.5 (3) MG/3ML nebulizer solution 3 mL  3 mL Nebulization Q4H Neva Seat, MD      . Derrill Memo ON 12/25/2017] levothyroxine (SYNTHROID, LEVOTHROID) tablet 150 mcg  150 mcg Oral QAC breakfast Neva Seat, MD      . oxybutynin (DITROPAN XL) 24 hr tablet 15 mg  15 mg Oral QHS Neva Seat, MD      . oxyCODONE (Oxy IR/ROXICODONE) immediate release tablet 5 mg  5 mg Oral Q4H PRN Neva Seat, MD      . sodium chloride flush (NS) 0.9 % injection 3 mL  3 mL Intravenous Q12H Neva Seat, MD   3 mL at 12/24/17 1217  . sodium chloride flush (NS) 0.9 % injection 3 mL  3 mL Intravenous Q12H Neva Seat, MD   3 mL at 12/24/17 1216  . sodium chloride flush (NS) 0.9 % injection 3 mL  3 mL Intravenous PRN Neva Seat, MD       Current Outpatient Medications  Medication Sig Dispense Refill  . acetaminophen (TYLENOL) 325 MG tablet Take 2 tablets (650 mg total) by mouth every 4 (four) hours as needed for headache or mild pain.    Marland Kitchen albuterol (PROAIR HFA) 108 (90 Base) MCG/ACT inhaler Inhale 2 puffs into the lungs See admin instructions. Inhale 2 puffs into the lungs every 4-6 hours as needed for shortness of breath or wheezing    . aspirin 81 MG chewable tablet Chew 1 tablet (81 mg total) by mouth daily.    Marland Kitchen atorvastatin (LIPITOR) 80 MG tablet Take 1 tablet (80 mg total) by mouth daily at 6 PM. 30 tablet 6  . CALCIUM PO Take 1 tablet by mouth daily.    . Cholecalciferol (VITAMIN D3) 2000 units capsule Take 2,000 Units by mouth daily.     . clopidogrel (PLAVIX) 75 MG tablet Take 75 mg by  mouth daily.    . DULoxetine (CYMBALTA) 60 MG capsule Take 60 mg by mouth daily.   0  . EMGALITY 120 MG/ML SOAJ Inject 120 mg into the skin every 30 (thirty) days.   3  . fludrocortisone (FLORINEF) 0.1 MG tablet Take 1 tablet (0.1 mg total) by mouth 2 (two) times daily. 60 tablet 2  . HYDROcodone-acetaminophen (NORCO/VICODIN) 5-325 MG tablet Take 1 tablet by mouth every 6 (six) hours as  needed for pain.  0  . hydrocortisone (CORTEF) 5 MG tablet Take 5-15 mg by mouth See admin instructions. Take 15 mg by mouth in the morning and 5 mg in the afternoon    . Insulin Aspart, w/Niacinamide, (FIASP FLEXTOUCH) 100 UNIT/ML SOPN Inject 2-14 Units into the skin See admin instructions. Use three times day as needed before meals per sliding scale    . insulin degludec (TRESIBA FLEXTOUCH) 100 UNIT/ML SOPN FlexTouch Pen Inject 40 Units into the skin at bedtime.     Marland Kitchen ipratropium-albuterol (DUONEB) 0.5-2.5 (3) MG/3ML SOLN Take 3 mLs by nebulization 4 (four) times daily.    . Iron-FA-B Cmp-C-Biot-Probiotic (FUSION PLUS) CAPS Take 1 capsule by mouth daily.  5  . levothyroxine (SYNTHROID, LEVOTHROID) 150 MCG tablet Take 150 mcg by mouth daily before breakfast.    . Magnesium 400 MG TABS Take 400 mg by mouth daily.     . nitroGLYCERIN (NITROSTAT) 0.4 MG SL tablet Place 0.4 mg under the tongue every 5 (five) minutes as needed for chest pain.     Marland Kitchen oxybutynin (DITROPAN XL) 15 MG 24 hr tablet Take 15 mg by mouth at bedtime.    . potassium chloride SA (K-DUR,KLOR-CON) 20 MEQ tablet Take 1 tablet (20 mEq total) by mouth daily. 90 tablet 3  . pramipexole (MIRAPEX) 1 MG tablet Take 1 mg by mouth 2 (two) times daily.    . promethazine (PHENERGAN) 12.5 MG tablet Take 12.5 mg by mouth every 6 (six) hours as needed for nausea.  0  . torsemide (DEMADEX) 20 MG tablet Take 2 tablets (40 mg total) by mouth 2 (two) times daily. 60 tablet 1  . TRELEGY ELLIPTA 100-62.5-25 MCG/INH AEPB Inhale 1 puff into the lungs daily.  3  .  TRULICITY 1.5 UX/3.2TF SOPN Inject 1.5 mg into the skin every Sunday.   4  . dexamethasone (DECADRON) 2 MG tablet Take 1 tablet (2 mg total) by mouth daily. (Patient not taking: Reported on 12/24/2017) 5 tablet 0  . metoprolol succinate (TOPROL-XL) 25 MG 24 hr tablet Take 0.5 tablets (12.5 mg total) by mouth daily. 30 tablet 2  . ondansetron (ZOFRAN ODT) 4 MG disintegrating tablet Take 1 tablet (4 mg total) by mouth every 8 (eight) hours as needed for nausea or vomiting. (Patient not taking: Reported on 12/24/2017) 20 tablet 0  . ondansetron (ZOFRAN ODT) 4 MG disintegrating tablet Take 1 tablet (4 mg total) by mouth every 8 (eight) hours as needed for nausea or vomiting. (Patient not taking: Reported on 12/24/2017) 20 tablet 0  . oxyCODONE (OXY IR/ROXICODONE) 5 MG immediate release tablet Take 5 mg by mouth every 4 (four) hours as needed for pain.  0  . traMADol (ULTRAM) 50 MG tablet Take 1 tablet (50 mg total) by mouth every 6 (six) hours as needed. (Patient not taking: Reported on 12/10/2017) 15 tablet 0   Allergies: - Hydroxychloroquine - SOB, nausea - Donepezil - Prednisone - Anticoagulant cit dext - Bupropion - Metrizamide - Varenicline - Tape - rash   Past Medical History:  Diagnosis Date  . Acute on chronic systolic heart failure exacerbation(HCC) 04/08/2016  . CAD in native artery    a. Prior LAD stenting based on cath. b. RCA stenting 03/2016 x2.  . Chronic combined systolic and diastolic CHF (congestive heart failure) ([place])   . CKD (chronic kidney disease), stage II   . COPD (chronic obstructive pulmonary disease) ([place])   . Diabetes mellitus without complication ([place])   . Dyspnea   .  Hashimoto's thyroiditis   . Hyperlipidemia   . Hypertension   . Myocardial infarction ([place])   . On home oxygen therapy    "2L; 24/7" (10/23/2017)  . Secondary adrenal insufficiency ([place])   . Thyroid disease   . Tobacco abuse    Past Surgical History:  Procedure Laterality Date  . ABDOMINAL  SURGERY    . CESAREAN SECTION    . CHOLECYSTECTOMY    . COLON RESECTION    . CORONARY BALLOON ANGIOPLASTY N/A 10/13/2017   Procedure: CORONARY BALLOON ANGIOPLASTY;  Surgeon: Belva Crome, MD;  Location: [facility];  Service: Cardiovascular;  Laterality: N/A;  . HERNIA MESH REMOVAL    . HERNIA REPAIR    . LEFT HEART CATH AND CORONARY ANGIOGRAPHY N/A 10/13/2016   Procedure: Left Heart Cath and Coronary Angiography;  Surgeon: Nelva Bush, MD;  Location: [facility];  Service: Cardiovascular;  Laterality: N/A;  . RIGHT/LEFT HEART CATH AND CORONARY ANGIOGRAPHY N/A 10/13/2017   Procedure: RIGHT/LEFT HEART CATH AND CORONARY ANGIOGRAPHY;  Surgeon: Belva Crome, MD;  Location: [facility];  Service: Cardiovascular;  Laterality: N/A;  . SHOULDER ARTHROSCOPY    . TUBAL LIGATION     Family history: - Mother: stroke - Father: diabetes - Two sisters: both with diabetes - Son: diabetes  Social History: - Married, lives with husband, who is present with her in the ED. - Current every day smoker; 90 yr smoking history with 4.09 ppd - ~7 alcoholic beverages per week  - On file that she tried marijuana once in June 2019; none before or after   Review of Systems: Pertinent items are noted in HPI.  Physical Exam: Blood pressure 116/87, pulse (!) 105, temperature 97.9 F (36.6 C), resp. rate 20, SpO2 100 %. General appearance: alert, cooperative, no distress and morbidly obese Lungs: clear to auscultation bilaterally Heart: regular rate and rhythm Abdomen: Soft, mildly tender; hyperactive bowel sounds; midline wound VAC Extremities: bilateral lower extremity 2+ pitting edema  Lab results: Type and screen: A pos  Bicarb 38 Glucose 627 (at presentation) Cr 1.05 Ca 7.8   Hgb 8.1 HCT 26.2 RDW 18.1  Occult blood test: positive  Lactic acid: normal   Imaging results:  - CT abdomen pelvis w/contrast 09/29: Impression: 1. No explanation for rectal bleeding. No  abnormality of the colon identified. 2. No bowel obstruction.  Small bowel appears normal. 3. Apparent evacuation of ventral abdominal wall abscess. Open wound remains. 4. Decrease in size of presumed RIGHT groin hematoma. 5. Small exophytic lesion from the LEFT kidney again demonstrated.   Other tests: - R/L heart cath 07/19: Proximal to mid LAD stent with diffuse 30% restenosis. Proximal circumflex with an eccentric 50 to 65% stenosis depending upon review. Dominant right coronary with proximal to distal stenting and overlap fashion, possibly with some region of stent within stent. Distally there is an eccentric 70 to 75% stenosis that is clearly progressed when compared to one year ago when there was less than 50% narrowing. Mild pulmonary hypertension with mean PA pressure of 30 mmHg.  Reduced LV systolic function with regional wall motion abnormality including severe hypokinesis of the inferobasal wall. Estimated EF 40 to 45%. Successful balloon angioplasty of distal right coronary ISR reducing from 75% to 0% with TIMI grade III flow using a 3.5 mm Ramona balloon x2 inflations.  Assessment & Plan by Problem: Active Problems:   GI bleed  Mrs. Prewitt is a 62 yo female with a past  medical history of laparotomy w/lysis of adhesions and release of SBO on 11/30/17, diverticulitis s/p bowel resection (2016), hemorrhoids, CAD, LAD and RCA stents (on Plavix), CHF, diabetes, CKD stage II, secondary adrenal insufficiency, COPD (on 2L [place] at home), HTN, and tobacco use, who presented to the ED on 09/29 with a five day history of rectal bleeding.   Lower GI bleed: Mrs. Drury presented on 09/29 with rectal bleeding for five days. She has a history of diverticulitis s/p bowel resection and hemorrhoids. She had laparotomy for lysis adhesion and release of SBO on 09/05, and still has a wound VAC in place. Her Hgb was 8.1 at presentation on 09/29, decreased from 13.0 on 09/15. She was mildly hypotensive upon arrival  and was given 1.5L of NS, with increases in her BP. She was transfused with  2 units PRBC. CT revealed no abnormality of the colon, no explanation for rectal bleeding, and no small bowel obstruction. We suspect that her lower GI bleeding is a diverticular bleed or ischemia secondary to her recent abdominal/small bowel surgery. GI was consulted and gave their recommendations. We will monitor her H&H and transfuse as needed. If bleeding persists, will consider a GI bleed scan and embolization by IR. Will refrain from colonoscopy due to recent abdominal surgery on 09/05 and wound VAC in place.  - H&H q6h, transfuse as needed - Awaiting cardio recs about Plavix and aspirin as below - po or suppository Tylenol 650 mg q6h prn for mild pain  - oxycodone 5 mg q4h prn for moderate or severe pain  - Continuous cardiac monitoring  CAD w/stable angina s/p stent placement: Mrs. Hendricks has a history of CAD and CHF with EF 40-45% on most recent cath on 07/19. She has an LAD stent and RCA stent, and is on aspirin and Plavix indefinitely. We have consulted cardio to ask whether we can stop her Plavix and aspirin, given her recent GI surgery and ongoing GI bleed. - Awaiting cardio recs  Diabetes: Mrs. Rietz was hyperglycemic at presentation with blood glucose of 627. She was started on continuous insulin drip until BG decreased to 145. She was then started on Novolog correction coverage TID with meals, Novolog correction coverage at bedtime, and Lantus 30U daily. Will continue to monitor her CBG QID, before meals and at bedtime, and STAT CBG if hypoglycemic.  - Continue home Duloxetine DR 60 mg qd  Adrenal Insufficiency: Will continue on her home meds - Fludrocortisone 0.1 mg BID and Hydrocortisone 5 mg BID.  COPD: Continue home Duonebs 3 mL nebulized q4h and Fluticasone-Umeclidin-Vilant 1 puff daily.  Hypothyroidism: Continue home Levothyroxine 150 mcg qd.  Hyperlipidemia: Continue home Atorvastatin 80 mg  qd.  Fluids: D5 1/2 NS Diet: clear liquid diet DVT ppx: SCDs Code: Full  This is a Careers information officer Note.  The care of the patient was discussed with Dr. Truman Hayward and the assessment and plan was formulated with their assistance.  Please see their note for official documentation of the patient encounter.   Signed: Dimas Millin, Medical Student 12/24/2017, 1:56 PM   Attestation for Student Documentation:  I personally was present and performed or re-performed the history, physical exam and medical decision-making activities of this service and have verified that the service and findings are accurately documented in the student's note.  Mrs. Ressler is a 62 yo F w/ PMH of diverticulosis, recent bowel resection & adhesion lysis, internal hemorrhoids, Sjogren's Syndromeon chronic steroids, CAD s/p stents x2 on DAPT, T2DM, COPD, Hypothyroidism,  HLD admit for GI bleed. Presented with hemoglobin of 8.1 and BRBPR & Melena. No hx of chronic NSAID use or significant GERD but recent bowel resection and hx of hemorrhoids makes lower GI bleed more likely. GI states colonoscopy if contraindicated and most likely will require bleeding scan if active bleed. Awaiting Cards recommendation for stopping anti-platelet therapy or not. Currently transfusing 2 units and awaiting f/u H&H.   Mosetta Anis, MD 12/24/2017, 3:18 PM

## 2017-12-24 NOTE — ED Notes (Signed)
IV team at bedside to start IV for blood transfusion

## 2017-12-24 NOTE — ED Notes (Signed)
Per MD Trilby Drummer, run insulin infusion for 30 minutes and start D5 1/2 NS, turn off insulin drip in 30 minutes and give Lantus.

## 2017-12-24 NOTE — Progress Notes (Signed)
Crystal Yates is a 62 y.o. female patient admitted from ED awake, alert - oriented  X 4 - no acute distress noted.  VSS - Blood pressure 107/67, pulse (!) 101, temperature 98.1 F (36.7 C), temperature source Oral, resp. rate 16, SpO2 99 %.    IV in place, occlusive dsg intact without redness.  Orientation to room, and floor completed with information packet given to patient/family.  Patient declined safety video at this time.  Admission INP armband ID verified with patient/family, and in place.   SR up x 2, fall assessment complete, with patient and family able to verbalize understanding of risk associated with falls, and verbalized understanding to call nsg before up out of bed.  Call light within reach, patient able to voice, and demonstrate understanding.  Skin, clean-dry- intact without evidence of bruising, or skin tears.   No evidence of skin break down noted on exam.     Will cont to eval and treat per MD orders.  Holley Raring, RN 12/24/2017 5:00 PM

## 2017-12-24 NOTE — ED Notes (Signed)
Wound vac noted to patients abdomen.  Some leakage of air noted.  Applied tegaderm to leaking area.  No further leaking noted.

## 2017-12-25 DIAGNOSIS — K648 Other hemorrhoids: Secondary | ICD-10-CM

## 2017-12-25 DIAGNOSIS — I130 Hypertensive heart and chronic kidney disease with heart failure and stage 1 through stage 4 chronic kidney disease, or unspecified chronic kidney disease: Secondary | ICD-10-CM | POA: Diagnosis not present

## 2017-12-25 DIAGNOSIS — K922 Gastrointestinal hemorrhage, unspecified: Secondary | ICD-10-CM | POA: Diagnosis not present

## 2017-12-25 DIAGNOSIS — J449 Chronic obstructive pulmonary disease, unspecified: Secondary | ICD-10-CM | POA: Diagnosis not present

## 2017-12-25 DIAGNOSIS — I2510 Atherosclerotic heart disease of native coronary artery without angina pectoris: Secondary | ICD-10-CM | POA: Diagnosis not present

## 2017-12-25 DIAGNOSIS — M3500 Sicca syndrome, unspecified: Secondary | ICD-10-CM

## 2017-12-25 DIAGNOSIS — I5032 Chronic diastolic (congestive) heart failure: Secondary | ICD-10-CM | POA: Diagnosis not present

## 2017-12-25 DIAGNOSIS — I25118 Atherosclerotic heart disease of native coronary artery with other forms of angina pectoris: Secondary | ICD-10-CM | POA: Diagnosis not present

## 2017-12-25 DIAGNOSIS — Z794 Long term (current) use of insulin: Secondary | ICD-10-CM

## 2017-12-25 DIAGNOSIS — Z720 Tobacco use: Secondary | ICD-10-CM

## 2017-12-25 DIAGNOSIS — Z7952 Long term (current) use of systemic steroids: Secondary | ICD-10-CM

## 2017-12-25 LAB — HEMOGLOBIN AND HEMATOCRIT, BLOOD
HCT: 33.1 % — ABNORMAL LOW (ref 36.0–46.0)
HEMATOCRIT: 32 % — AB (ref 36.0–46.0)
HEMATOCRIT: 32 % — AB (ref 36.0–46.0)
HEMOGLOBIN: 10.8 g/dL — AB (ref 12.0–15.0)
Hemoglobin: 10.3 g/dL — ABNORMAL LOW (ref 12.0–15.0)
Hemoglobin: 10.2 g/dL — ABNORMAL LOW (ref 12.0–15.0)

## 2017-12-25 LAB — BASIC METABOLIC PANEL
ANION GAP: 8 (ref 5–15)
BUN: 10 mg/dL (ref 8–23)
CALCIUM: 8 mg/dL — AB (ref 8.9–10.3)
CO2: 33 mmol/L — AB (ref 22–32)
Chloride: 96 mmol/L — ABNORMAL LOW (ref 98–111)
Creatinine, Ser: 0.81 mg/dL (ref 0.44–1.00)
GFR calc Af Amer: >60 mL/min (ref >60)
Glucose, Bld: 101 mg/dL — ABNORMAL HIGH (ref 70–99)
Potassium: 3.2 mmol/L — ABNORMAL LOW (ref 3.5–5.1)
Sodium: 137 mmol/L (ref 135–145)

## 2017-12-25 LAB — GLUCOSE, CAPILLARY
GLUCOSE-CAPILLARY: 111 mg/dL — AB (ref 70–99)
GLUCOSE-CAPILLARY: 389 mg/dL — AB (ref 70–99)
Glucose-Capillary: 249 mg/dL — ABNORMAL HIGH (ref 70–99)
Glucose-Capillary: 271 mg/dL — ABNORMAL HIGH (ref 70–99)

## 2017-12-25 MED ORDER — CARVEDILOL 3.125 MG PO TABS
3.1250 mg | ORAL_TABLET | Freq: Two times a day (BID) | ORAL | Status: DC
  Administered 2017-12-25 – 2017-12-26 (×2): 3.125 mg via ORAL
  Filled 2017-12-25 (×2): qty 1

## 2017-12-25 MED ORDER — COLLAGENASE 250 UNIT/GM EX OINT
TOPICAL_OINTMENT | Freq: Every day | CUTANEOUS | Status: DC
  Administered 2017-12-25 – 2017-12-26 (×2): via TOPICAL
  Filled 2017-12-25: qty 30

## 2017-12-25 MED ORDER — INSULIN ASPART 100 UNIT/ML ~~LOC~~ SOLN
0.0000 [IU] | Freq: Three times a day (TID) | SUBCUTANEOUS | Status: DC
  Administered 2017-12-25: 20 [IU] via SUBCUTANEOUS
  Administered 2017-12-26: 4 [IU] via SUBCUTANEOUS

## 2017-12-25 MED ORDER — POLYETHYLENE GLYCOL 3350 17 G PO PACK
17.0000 g | PACK | Freq: Every day | ORAL | Status: DC
  Administered 2017-12-25: 17 g via ORAL
  Filled 2017-12-25: qty 1

## 2017-12-25 MED ORDER — UMECLIDINIUM BROMIDE 62.5 MCG/INH IN AEPB
1.0000 | INHALATION_SPRAY | Freq: Every day | RESPIRATORY_TRACT | Status: DC
  Administered 2017-12-26: 1 via RESPIRATORY_TRACT
  Filled 2017-12-25: qty 7

## 2017-12-25 MED ORDER — POTASSIUM CHLORIDE CRYS ER 20 MEQ PO TBCR
40.0000 meq | EXTENDED_RELEASE_TABLET | Freq: Once | ORAL | Status: AC
  Administered 2017-12-25: 40 meq via ORAL
  Filled 2017-12-25: qty 2

## 2017-12-25 MED ORDER — FLUTICASONE FUROATE-VILANTEROL 100-25 MCG/INH IN AEPB
1.0000 | INHALATION_SPRAY | Freq: Every day | RESPIRATORY_TRACT | Status: DC
  Administered 2017-12-26: 1 via RESPIRATORY_TRACT
  Filled 2017-12-25: qty 28

## 2017-12-25 NOTE — Consult Note (Signed)
[facility] Nurse wound consult note,  Patient evaluated on [phone]. Husband present. Reason for Consult: NPWT (VAC) Dressing change. Wound type: Surgical  Measurements:  Mid line surgical incision consists of three distinct wounds. Patient presented with home VAC dressing to upper and lower wounds. After removing VAC dressing and assessing characteristics of wounds, it was determined that not of these are appropriate for [facility] therapy.  The most superior wound is 100% thick yellow necrotic slough. This wound bed measures 4cm x 3cm x 0.7cm. The depth was determined at the area that had a very thin layer of slough through which a pink wound bed could be seen. There is a 1cm tunnel at the 11 o'clock hour. Just below the described there was an area of 100% thick slough that measured 1cm x 1cm.  And finally at the base of the incision there was a wound that measured 3cm x 3cm x 0.5cm with undermining of 0.5cm from 6-9 o'clock. This wound bed is 100% pink and clean.  The superior and inferior wound edges were rolled.  Plan of Care: Apply Santyl to the 2 top wounds in a nickel thick layer. Cover with a saline moistened gauze, then dry gauze or ABD pad.  Change daily. Aquacel Ag+ to the inferior wound daily.  The wound presentations and care were discussed with Nena Polio, 3rd year medical student. She will discuss with Drs. Lee and [employer]. Monitor the wound area(s) for worsening of condition such as: Signs/symptoms of infection,  Increase in size,  Development of or worsening of odor, Development of pain, or increased pain at the affected locations.  Notify the medical team if any of these develop.  Thank you for the consult.  Discussed plan of care with the patient and bedside nurse.  [place] nurse will not follow at this time.  Please re-consult the [facility] team if needed.  Val Riles, RN, MSN, CWOCN, CNS-BC, pager [phone]

## 2017-12-25 NOTE — Progress Notes (Signed)
Date: 12/25/2017  Patient name: Crystal Yates  Medical record number: [id number]  Date of birth: Sep 16, 1955   I have seen and evaluated [name] and discussed their care with the Residency Team. Ms Crystal Yates is a 62 yo community dwelling female with diastolic heart failure and recent admissions for volume overload, CAD with LAD and RCA stents and July 2019 on dual antiplatelet therapy, laparotomy with lysis of adhesions and SBO release on September 29, 2017 with a wound VAC in place.  She presented to the ED with 5 days of BRBPR but not similar to her history of hemorrhoidal bleeds.  She had associated weakness, fatigue, dizziness, and lightheadedness.  Her baseline hemoglobin is difficult to determine as she had a pseudo-femoral aneurysm with hematoma after a catheterization in July but it appears to be somewhere close to 13.  She was admitted with a hemoglobin of 8.1 and was transfused 2 units PRBC with an appropriate bump in her hemoglobin.  As she had a stent placed in July of this year, cardiology was consulted who agreed that she could hold her dual antiplatelet therapy until bleeding could be localized and treated.  This morning, she continues to haveBRBPR and stable dyspnea.  PMHx, Fam Hx, and/or Soc Hx : Other past medical history includes chronic hypoxic respiratory failure on 2 L oxygen at home, tobacco use, hypertension, CKD stage II, and diabetes.  She is married.  Vitals:   12/25/17 0508 12/25/17 0839  BP: 113/66 91/67  Pulse: 73 (!) 108  Resp: 19   Temp: 98.1 F (36.7 C) 98 F (36.7 C)  SpO2: 97% 98%  NAD. Sitting in recliner HRRR no MRG LCTAB with good air flow ABD + BS Ext + 2 pitting edema  Albumin 2.2 HgB 8.1 - 2 units - 10.3 - 10.8 - 10.3  Assessment and Plan: I have seen and evaluated the patient as outlined above. I agree with the formulated Assessment and Plan as detailed in the residents' note, with the following changes:  Ms Crystal Yates is a 62 yo  community dwelling female with diastolic heart failure and recent admissions for volume overload, CAD with LAD and RCA stents and July 2019 on dual antiplatelet therapy, laparotomy with lysis of adhesions and SBO release on September 29, 2017 with a wound VAC in place.  She presented with an acute lower GI bleed requiring 2 units packed red blood cells.  Subsequently, her hemoglobin has been stable but she continues to have ongoing bleeding.  Endoscopy is not ideal at this time due to her recent SBO, abdominal surgery, and current wound VAC.  GI has recommended a tagged RBC scan if he continues to have bleeding.  Cardiology is okay with holding her aspirin and Plavix for now.  We will stop IV fluids as her weight is up 7.4 kg since her discharge in August 2019.  Her dry weight been was recorded as 99.3 kg.  Due to ongoing blood loss, normal oxygenation on her home 2 L, and clear lungs, we will not start diuresis at this time but will follow closely.  1.  Lower GI bleed -change H&H to every 12 hours.  Continue to hold aspirin on 5 hours.  Follow-up triage recommendations.  2.  CAD with stent July 2019 -hold dual antiplatelet therapy.  Continue high intensity statin and her carvedilol.  3.  Chronic diastolic heart failure -she is 7.4 kg up from her discharge weight in August.  We have stopped IV fluids.  We  will check daily weights but hold off on diuresis in the setting of GI bleeding.  4.  Adrenal insufficiency -she did not get stress dosing on admission but we will consider stress test if she decompensates.  Continue home dosing.  Bartholomew Crews, MD 9/30/201910:31 AM

## 2017-12-25 NOTE — Progress Notes (Addendum)
Subjective: This morning, Crystal Yates appeared more alert and comfortable than yesterday, and she was sitting up in her chair. She said she felt "perkier" today. She has continued to have blood in her bowel movements. She still complains of some dizziness, weakness, and shortness of breath. She denies abdominal pain or pain with bowel movements. She notes that her legs appeared very swollen when she removed her compression stockings last night.   Objective: Vital signs in last 24 hours: Vitals:   12/24/17 2026 12/24/17 2101 12/25/17 0508 12/25/17 0839  BP:  (!) 110/46 113/66 91/67  Pulse: 68 99 73 (!) 108  Resp: 16 20 19    Temp:  97.8 F (36.6 C) 98.1 F (36.7 C) 98 F (36.7 C)  TempSrc:  Oral Oral Oral  SpO2: 96% 93% 97% 98%  Weight:   106.7 kg     Intake/Output Summary (Last 24 hours) at 12/25/2017 0930 Last data filed at 12/25/2017 0530 Gross per 24 hour  Intake 2056.67 ml  Output -  Net 2056.67 ml   General appearance: alert, cooperative, no distress and morbidly obese Lungs: clear to auscultation bilaterally Heart: regular rate and rhythm and S1, S2 normal Abdomen: soft, non-tender; bowel sounds normal; no masses,  no organomegaly Extremities: bilateral lower extremity 3+ pitting edema Skin: hyperpigmentation - hand(s) left   Lab Results: H&H trends: Hgb: 8.1 (at presentation), 10.3, 10.8, 10.3 (at present) HCT: 26.2 (at presentation), 31.5, 33.1, 32.0 (at present)  K 3.2 BG 101  Ca 8.0  Micro Results: No results found for this or any previous visit (from the past 240 hour(s)).   Studies/Results: - CT abdomen pelvis w/contrast 09/29: Impression: 1. No explanation for rectal bleeding. No abnormality of the colon identified. 2. No bowel obstruction.  Small bowel appears normal. 3. Apparent evacuation of ventral abdominal wall abscess. Open wound remains. 4. Decrease in size of presumed RIGHT groin hematoma. 5. Small exophytic lesion from the LEFT kidney again  demonstrated.   Medications: I have reviewed the patient's current medications. Scheduled Meds: . atorvastatin  80 mg Oral q1800  . carvedilol  3.125 mg Oral BID WC  . DULoxetine  60 mg Oral Daily  . fludrocortisone  0.1 mg Oral BID  . fluticasone furoate-vilanterol  1 puff Inhalation Daily  . hydrocortisone  15 mg Oral BH-q7a  . hydrocortisone  5 mg Oral Q lunch  . insulin aspart  0-15 Units Subcutaneous TID WC  . insulin aspart  0-5 Units Subcutaneous QHS  . insulin glargine  30 Units Subcutaneous Daily  . ipratropium-albuterol  3 mL Nebulization TID  . levothyroxine  150 mcg Oral QAC breakfast  . oxybutynin  15 mg Oral QHS  . umeclidinium bromide  1 puff Inhalation Daily   Continuous Infusions: . insulin (NOVOLIN-R) infusion Stopped (12/24/17 1217)   PRN Meds:.sodium chloride, acetaminophen **OR** acetaminophen, ipratropium-albuterol, oxyCODONE, sodium chloride flush Assessment/Plan: Active Problems:   Lower GI bleed   Hyperglycemia  Mrs. Green is a 62 yo female with a past medical history of laparotomy w/lysis of adhesions and release of SBO on 11/30/17, diverticulitis s/p bowel resection (2016), hemorrhoids, CAD, LAD and RCA stents (on Plavix), CHF, diabetes, CKD stage II, secondary adrenal insufficiency, COPD (on 2L [place] at home), HTN, and tobacco use, presenting with anemia secondary to a 5 day history of lower GI bleed.   Anemia 2/2 Lower GI bleed: Mrs. Corsi presented on 09/29 with rectal bleeding for five days. She has a history of diverticulitis s/p bowel resection and  hemorrhoids. She had laparotomy for lysis adhesion and release of SBO on 09/05, and still has a wound VAC in place. We suspect that her lower GI bleeding is a diverticular bleed or ischemia secondary to her recent abdominal/small bowel surgery. After 2 units of PRBC, her Hgb has increased to 10.3 from 8.1 yesterday (09/29) and is currently stable. We will continue to monitor her H&H, and if bleeding  persists, will consider a GI bleed scan and embolization by IR.  - H&H q12h, transfuse if needed - Hold aspirin and Plavix as below - po or suppository Tylenol 650 mg q6h prn for mild pain  - oxycodone 5 mg q4h prn for moderate or severe pain  - Continuous cardiac monitoring  CAD w/stable angina s/p stent placement: Mrs. Mccolgan has a history of CAD with an LAD stent and RCA stent and was placed on aspirin and Plavix indefinitely. Cardio was consulted and recommended acutely holding both aspirin and Plavix. They will reassess after we are able to determine the cause of her GI bleeding.   CHF: Mrs. Mcgough has CHF with EF 40-45% on most recent cath on 07/19. At discharge from a CHF exacerbation on 10/27/17, her discharge weight was 99.3 kg (218 lbs), with a goal dry weight of 213 lbs. Today, her weight was 106.7 kg. Her bilateral lower extremities have 3+ pitting edema today, so we discontinued her fluids and will monitor her daily weights.    Diabetes: Her blood sugar is remaining controlled on Novolog correction coverage TID with meals, Novolog correction coverage at bedtime, and Lantus 30U daily. Will continue to monitor her CBG QID, before meals and at bedtime, and STAT CBG if hypoglycemic.  - Continue home insulin Lantus 30 Units daily - Sliding scale - Glucose checks  Adrenal Insufficiency: Will continue on her home meds - Fludrocortisone 0.1 mg BID and Hydrocortisone 5 mg BID. Continue to monitor her for signs of infection.   COPD: Continue home Duonebs 3 mL nebulized q4h and Fluticasone-Umeclidin-Vilant 1 puff daily.  Hypothyroidism: Continue home Levothyroxine 150 mcg qd.  Hyperlipidemia: Continue home Atorvastatin 80 mg qd.  Fluids: none Electrolytes: K 3.2, repleted with one dose Klor-Con 40 mEq; monitor and replace as needed   Diet: clear liquid diet DVT ppx: SCDs Code: Full  This is a Careers information officer Note.  The care of the patient was discussed with Dr. Truman Hayward and the  assessment and plan formulated with their assistance.  Please see their attached note for official documentation of the daily encounter.   LOS: 1 day   Dimas Millin, Medical Student 12/25/2017, 10:20 AM   Attestation for Student Documentation:  I personally was present and performed or re-performed the history, physical exam and medical decision-making activities of this service and have verified that the service and findings are accurately documented in the student's note.  Mrs. Charlet is a 62 yo F w/ PMH of diverticulosis, recent bowel resection & adhesion lysis, internal hemorrhoids, Sjogren's Syndromeon chronic steroids, CAD s/p stents x2 on DAPT, T2DM, COPD, Hypothyroidism, HLD admit for GI bleed  She was examined at bedside and reports that she is doing well with exception of difficulty sleeping. She still has ongoing BPBPR and this was visible in the bedside commode. She denies any pain associated with the hematochezia. In the past 3 days, she has had alternating melena and hematochezia. She inquired about her CT abdomen results showing her bleed. Explained to her that CT did not visualized any obvious etiology of her  rectal bleeding as her colon appeared normal. She reports that her SOB have been stable since admission and has been receiving in-house breathing treatments. Reports that her weights have been unstable and reports she has had several instances of lower extremity edema. Denies any F/N/V. Endorsing appetite and requesting solid foods.  Vitals:   12/25/17 0508 12/25/17 0839  BP: 113/66 91/67  Pulse: 73 (!) 108  Resp: 19   Temp: 98.1 F (36.7 C) 98 F (36.7 C)  SpO2: 97% 98%   Physical Exam: Gen: NAD, Well-appearing, On [place] 2L HEENT: EOMI, PERRL, [place] WNL Neck: No JVD, No cervical LAD, supple CV: RRR, S1 S2 Wnl, no murmurs or gallops. PPP Lung: CTAB, no wheeze, rhonchi, or rales Abd: Soft, BS+, Nontender, wound vac in place w/o surrounding erythema,  warmth MSK: 2+ pitting edema up to upper shin Skin: Warm and dry Neuro: Cns 2-12 intact, - FND  A/P: GI bleed - H&H q12 hr - Appreciate GI recs - Possible bleeding scan  DM2 Stable on Lantus 30 Units  - C/w Lantus 30 units, duloxetine for diabetic neuropathy  CHF Pitting edema on exam - d/c fluids, strict I&Os, daily weights - Appreciate Card recs   Mosetta Anis, MD 12/25/2017, 10:56 AM

## 2017-12-25 NOTE — Progress Notes (Addendum)
Progress Note  Patient Name: Crystal Yates Date of Encounter: 12/25/2017  Primary Cardiologist: Jenne Campus, MD    Subjective   No angina   Inpatient Medications    Scheduled Meds: . atorvastatin  80 mg Oral q1800  . carvedilol  3.125 mg Oral BID WC  . DULoxetine  60 mg Oral Daily  . fludrocortisone  0.1 mg Oral BID  . fluticasone furoate-vilanterol  1 puff Inhalation Daily  . hydrocortisone  15 mg Oral BH-q7a  . hydrocortisone  5 mg Oral Q lunch  . insulin aspart  0-15 Units Subcutaneous TID WC  . insulin aspart  0-5 Units Subcutaneous QHS  . insulin glargine  30 Units Subcutaneous Daily  . ipratropium-albuterol  3 mL Nebulization TID  . levothyroxine  150 mcg Oral QAC breakfast  . oxybutynin  15 mg Oral QHS  . sodium chloride flush  3 mL Intravenous Q12H  . sodium chloride flush  3 mL Intravenous Q12H  . umeclidinium bromide  1 puff Inhalation Daily   Continuous Infusions: . sodium chloride    . dextrose 5 % and 0.45% NaCl 75 mL/hr at 12/25/17 0231  . insulin (NOVOLIN-R) infusion Stopped (12/24/17 1217)   PRN Meds: sodium chloride, acetaminophen **OR** acetaminophen, ipratropium-albuterol, oxyCODONE, sodium chloride flush   Vital Signs    Vitals:   12/24/17 2026 12/24/17 2101 12/25/17 0508 12/25/17 0839  BP:  (!) 110/46 113/66 91/67  Pulse: 68 99 73 (!) 108  Resp: 16 20 19    Temp:  97.8 F (36.6 C) 98.1 F (36.7 C) 98 F (36.7 C)  TempSrc:  Oral Oral Oral  SpO2: 96% 93% 97% 98%  Weight:   106.7 kg     Intake/Output Summary (Last 24 hours) at 12/25/2017 6269 Last data filed at 12/25/2017 0530 Gross per 24 hour  Intake 2056.67 ml  Output -  Net 2056.67 ml   Filed Weights   12/25/17 0508  Weight: 106.7 kg    Telemetry    None  ECG    NSR no acute ST changes  - Personally Reviewed  Physical Exam  Overweight white female  GEN: No acute distress.   Neck: No JVD Cardiac: RRR, no murmurs, rubs, or gallops.  Respiratory: Clear  to auscultation bilaterally. GI: Soft, nontender, non-distended  MS: No edema; No deformity. Neuro:  Nonfocal  Psych: Normal affect   Labs    Chemistry Recent Labs  Lab 12/24/17 0451 12/25/17 0547  NA 135 137  K 3.6 3.2*  CL 88* 96*  CO2 38* 33*  GLUCOSE 627* 101*  BUN 21 10  CREATININE 1.05* 0.81  CALCIUM 7.8* 8.0*  PROT 5.3*  --   ALBUMIN 2.2*  --   AST 21  --   ALT 38  --   ALKPHOS 93  --   BILITOT 0.3  --   GFRNONAA 56* >60  GFRAA >60 >60  ANIONGAP 9 8     Hematology Recent Labs  Lab 12/24/17 0451 12/24/17 1950 12/25/17 0239 12/25/17 0547  WBC 7.6  --   --   --   RBC 2.80*  --   --   --   HGB 8.1* 10.3* 10.8* 10.3*  HCT 26.2* 31.5* 33.1* 32.0*  MCV 93.6  --   --   --   MCH 28.9  --   --   --   MCHC 30.9  --   --   --   RDW 18.1*  --   --   --  PLT 248  --   --   --     Cardiac EnzymesNo results for input(s): TROPONINI in the last 168 hours. No results for input(s): TROPIPOC in the last 168 hours.   BNPNo results for input(s): BNP, PROBNP in the last 168 hours.   DDimer No results for input(s): DDIMER in the last 168 hours.   Radiology    Ct Abdomen Pelvis W Contrast  Result Date: 12/24/2017 CLINICAL DATA:  Rectal bleeding. Recent surgery for small bowel obstruction. EXAM: CT ABDOMEN AND PELVIS WITH CONTRAST TECHNIQUE: Multidetector CT imaging of the abdomen and pelvis was performed using the standard protocol following bolus administration of intravenous contrast. CONTRAST:  137mL OMNIPAQUE IOHEXOL 300 MG/ML  SOLN COMPARISON:  CT 11/27/2017 FINDINGS: Lower chest: Lung bases are clear. Hepatobiliary: No focal hepatic lesion. Postcholecystectomy. No biliary dilatation. Pancreas: Pancreas is normal. No ductal dilatation. No pancreatic inflammation. Spleen: Normal spleen Adrenals/urinary tract: Adrenal glands normal. Nonobstructing calculus in the mid RIGHT kidney measures 2 mm. High-density lesion extending from the posterior aspect of the RIGHT kidney  measuring 10 mm (image 37/3). No ureterolithiasis or obstructive uropathy. Bladder normal. Stomach/Bowel: Stomach, and duodenum small bowel are normal. Contrast travels to the terminal ileum. Appendix not identified. Ascending transverse colon are normal. Descending colon and rectosigmoid colon normal. No explanation for rectal bleeding. Vascular/Lymphatic: Abdominal aorta is normal caliber with atherosclerotic calcification. There is no retroperitoneal or periportal lymphadenopathy. No pelvic lymphadenopathy. Reproductive: Post hysterectomy. Other: Interval evacuation of small ventral midline abscess seen on comparison exam. There is an open wound at this site with a (image 35/3.) Oblong high-density collection in the RIGHT lower quadrant subcu tissue measuring 7.8 cm compared to 8.9 cm. Presumably resolving hematoma. Musculoskeletal: No aggressive osseous lesion. IMPRESSION: 1. No explanation for rectal bleeding. No abnormality of the colon identified. 2. No bowel obstruction.  Small bowel appears normal. 3. Apparent evacuation of ventral abdominal wall abscess. Open wound remains. 4. Decrease in size of presumed RIGHT groin hematoma. 5. Small exophytic lesion from the LEFT kidney again demonstrated. Indeterminate Electronically Signed   By: Suzy Bouchard M.D.   On: 12/24/2017 09:41    Cardiac Studies   TTE 10/19/17 EF 60-65% trivial MR  Patient Profile     62 y.o. female CAD full metal jacket to RCA with patent stents LAD and moderate circumflex disease. She had PCI for ISR with no stenting on 10/13/17 Admitted with LGI bleed DAT held   Assessment & Plan    CAD:  See note Dr Rayann Heman who discussed with interventional Dr Tamala Julian DAT held From our perspective ok to proceed with colonoscopy as identifying area of bleed will facilitate resumption of DAT Patient indicated possible PRBC tracer study She has no angina and is stable currently with Hct 32 this am  She has COPD ans is currently not on beta  blocker but  Was d/c on coreg will resume       For questions or updates, please contact [place] Please consult [email or web address] for contact info under        Signed, Jenkins Rouge, MD  12/25/2017, 9:52 AM

## 2017-12-25 NOTE — Progress Notes (Signed)
Wound care done

## 2017-12-25 NOTE — Progress Notes (Signed)
[facility] PROGRESS NOTE Subjective Patient has a long history of diverticular disease and complications from this.  Had a colonoscopy 2 years ago negative other than diverticulosis.  3 weeks ago she had surgery for SBO with lysis of adhesions done at [facility] and has had a wound infection requiring VAC placement.  She has had prior angioplasty and cardiology has recommended indefinite antiplatelet agents.  She came in bleeding and her antiplatelet agents are currently on hold.  Objective: Vital signs in last 24 hours: Temp:  [97.8 F (36.6 C)-98.1 F (36.7 C)] 98 F (36.7 C) (09/30 0839) Pulse Rate:  [68-108] 108 (09/30 0839) Resp:  [15-26] 19 (09/30 0508) BP: (91-141)/(46-97) 91/67 (09/30 0839) SpO2:  [93 %-100 %] 98 % (09/30 0839) Weight:  [106.7 kg] 106.7 kg (09/30 0508) Last BM Date: 12/24/17  Intake/Output from previous day: 09/29 0701 - 09/30 0700 In: 2056.7 [P.O.:120; I.V.:1321.7; Blood:615] Out: -  Intake/Output this shift: Total I/O In: 200 [P.O.:200] Out: -   PE: General--obese white female in no distress  Abdomen--somewhat distended nontender bowel sounds present  Lab Results: Recent Labs    12/24/17 0451 12/24/17 1950 12/25/17 0239 12/25/17 0547  WBC 7.6  --   --   --   HGB 8.1* 10.3* 10.8* 10.3*  HCT 26.2* 31.5* 33.1* 32.0*  PLT 248  --   --   --    BMET Recent Labs    12/24/17 0451 12/25/17 0547  NA 135 137  K 3.6 3.2*  CL 88* 96*  CO2 38* 33*  CREATININE 1.05* 0.81   LFT Recent Labs    12/24/17 0451  PROT 5.3*  AST 21  ALT 38  ALKPHOS 93  BILITOT 0.3   PT/INR No results for input(s): LABPROT, INR in the last 72 hours. PANCREAS No results for input(s): LIPASE in the last 72 hours.       Studies/Results: Ct Abdomen Pelvis W Contrast  Result Date: 12/24/2017 CLINICAL DATA:  Rectal bleeding. Recent surgery for small bowel obstruction. EXAM: CT ABDOMEN AND PELVIS WITH CONTRAST TECHNIQUE: Multidetector CT  imaging of the abdomen and pelvis was performed using the standard protocol following bolus administration of intravenous contrast. CONTRAST:  120mL OMNIPAQUE IOHEXOL 300 MG/ML  SOLN COMPARISON:  CT 11/27/2017 FINDINGS: Lower chest: Lung bases are clear. Hepatobiliary: No focal hepatic lesion. Postcholecystectomy. No biliary dilatation. Pancreas: Pancreas is normal. No ductal dilatation. No pancreatic inflammation. Spleen: Normal spleen Adrenals/urinary tract: Adrenal glands normal. Nonobstructing calculus in the mid RIGHT kidney measures 2 mm. High-density lesion extending from the posterior aspect of the RIGHT kidney measuring 10 mm (image 37/3). No ureterolithiasis or obstructive uropathy. Bladder normal. Stomach/Bowel: Stomach, and duodenum small bowel are normal. Contrast travels to the terminal ileum. Appendix not identified. Ascending transverse colon are normal. Descending colon and rectosigmoid colon normal. No explanation for rectal bleeding. Vascular/Lymphatic: Abdominal aorta is normal caliber with atherosclerotic calcification. There is no retroperitoneal or periportal lymphadenopathy. No pelvic lymphadenopathy. Reproductive: Post hysterectomy. Other: Interval evacuation of small ventral midline abscess seen on comparison exam. There is an open wound at this site with a (image 35/3.) Oblong high-density collection in the RIGHT lower quadrant subcu tissue measuring 7.8 cm compared to 8.9 cm. Presumably resolving hematoma. Musculoskeletal: No aggressive osseous lesion. IMPRESSION: 1. No explanation for rectal bleeding. No abnormality of the colon identified. 2. No bowel obstruction.  Small bowel appears normal. 3. Apparent evacuation of ventral abdominal wall abscess. Open wound remains. 4. Decrease in size of presumed RIGHT  groin hematoma. 5. Small exophytic lesion from the LEFT kidney again demonstrated. Indeterminate Electronically Signed   By: Suzy Bouchard M.D.   On: 12/24/2017 09:41     Medications: I have reviewed the patient's current medications.  Assessment:   1. GI Bleed.  Probably diverticular.  Seems to be stable off of antiplatelet agents.  Will continue to treat conservatively.  Would consider putting on MiraLAX just keep the stool soft.  Could probably go ahead and advance to full liquid diet. 2.  Recent SBO.  Had surgery lysis of adhesions just a few days ago with wound infection requiring VAC.   Plan: 1.  We will add MiraLAX and advance to full liquid diet.   Nancy Fetter 12/25/2017, 11:01 AM  This note was created using voice recognition software. Minor errors may Have occurred unintentionally.  Pager: [phone] If no answer or after hours call [phone]

## 2017-12-26 ENCOUNTER — Ambulatory Visit: Payer: Federal, State, Local not specified - PPO | Admitting: Cardiology

## 2017-12-26 DIAGNOSIS — I5022 Chronic systolic (congestive) heart failure: Secondary | ICD-10-CM

## 2017-12-26 DIAGNOSIS — K649 Unspecified hemorrhoids: Secondary | ICD-10-CM | POA: Diagnosis not present

## 2017-12-26 DIAGNOSIS — E11649 Type 2 diabetes mellitus with hypoglycemia without coma: Secondary | ICD-10-CM

## 2017-12-26 DIAGNOSIS — D62 Acute posthemorrhagic anemia: Secondary | ICD-10-CM | POA: Diagnosis not present

## 2017-12-26 DIAGNOSIS — Z91048 Other nonmedicinal substance allergy status: Secondary | ICD-10-CM

## 2017-12-26 DIAGNOSIS — K922 Gastrointestinal hemorrhage, unspecified: Secondary | ICD-10-CM | POA: Diagnosis not present

## 2017-12-26 DIAGNOSIS — Z888 Allergy status to other drugs, medicaments and biological substances status: Secondary | ICD-10-CM

## 2017-12-26 LAB — BASIC METABOLIC PANEL
Anion gap: 6 (ref 5–15)
BUN: 7 mg/dL — AB (ref 8–23)
CHLORIDE: 97 mmol/L — AB (ref 98–111)
CO2: 35 mmol/L — ABNORMAL HIGH (ref 22–32)
Calcium: 8.9 mg/dL (ref 8.9–10.3)
Creatinine, Ser: 0.81 mg/dL (ref 0.44–1.00)
GFR calc Af Amer: >60 mL/min (ref >60)
Glucose, Bld: 86 mg/dL (ref 70–99)
Potassium: 3.8 mmol/L (ref 3.5–5.1)
SODIUM: 138 mmol/L (ref 135–145)

## 2017-12-26 LAB — GLUCOSE, CAPILLARY
GLUCOSE-CAPILLARY: 42 mg/dL — AB (ref 70–99)
Glucose-Capillary: 62 mg/dL — ABNORMAL LOW (ref 70–99)
Glucose-Capillary: 106 mg/dL — ABNORMAL HIGH (ref 70–99)
Glucose-Capillary: 69 mg/dL — ABNORMAL LOW (ref 70–99)
Glucose-Capillary: 75 mg/dL (ref 70–99)
Glucose-Capillary: 170 mg/dL — ABNORMAL HIGH (ref 70–99)

## 2017-12-26 MED ORDER — POLYETHYLENE GLYCOL 3350 17 G PO PACK: 17.0000 g | PACK | Freq: Every day | ORAL | 0 refills | Status: AC

## 2017-12-26 NOTE — Care Management Note (Signed)
Case Management Note  Patient Details  Name: Crystal Yates MRN: [id number] Date of Birth: October 18, 1955  Subjective/Objective:      LGIB, hx of CHF/recent admissions for volume overload, CAD / stents and July 2019 laparotomy with lysis of adhesions and SBO.            Abbi Mancini (Spouse)     [phone]      PCP: Dortha Kern  Action/Plan: Transition to home with home health services to follow  Expected Discharge Date:  12/26/17               Expected Discharge Plan:  [place]  In-House Referral:     Discharge planning Services  CM Consult  Post Acute Care Choice:    Choice offered to:  Patient  DME Arranged:  N/A DME Agency:  NA  HH Arranged:  RN, Nurse's Aide [place] Agency:  [facility]  [phone] Fax # [phone] Status of Service:  Completed, signed off  If discussed at [place] of Stay Meetings, dates discussed:    Additional Comments:  Sharin Mons, RN 12/26/2017, 2:57 PM

## 2017-12-26 NOTE — Discharge Summary (Signed)
Name: Crystal Yates MRN: [id number] DOB: 26-Jun-1955 62 y.o. PCP: Dortha Kern, PA  Date of Admission: 12/24/2017  4:42 AM Date of Discharge:  12/26/17 Attending Physician: Lucious Groves, DO  Discharge Diagnosis: 1. Anemia secondary to lower GI bleed  Discharge Medications: Allergies as of 12/26/2017      Reactions   Hydroxychloroquine Shortness Of Breath, Nausea Only, Other (See Comments)   Dizziness (also)   Donepezil Other (See Comments)   Dizziness, depression, and makes the patient feel "funny"   Prednisone Other (See Comments)   Causes depression and suicidal thoughts   Anticoagulant Cit Dext [acd Formula A] Other (See Comments)   Unknown   Bupropion Other (See Comments)   Suicidal thoughts   Metrizamide Other (See Comments)   (a non-ionic radiopaque contrast agent) "Blows the vein" and contrast gathers at the injected site's limb   Varenicline Other (See Comments)   Suicidal thoughts   Tape Rash, Other (See Comments)   Paper tape is preferred, PLEASE      Medication List    STOP taking these medications   dexamethasone 2 MG tablet Commonly known as:  DECADRON   ondansetron 4 MG disintegrating tablet Commonly known as:  ZOFRAN-ODT   traMADol 50 MG tablet Commonly known as:  ULTRAM     TAKE these medications   acetaminophen 325 MG tablet Commonly known as:  TYLENOL Take 2 tablets (650 mg total) by mouth every 4 (four) hours as needed for headache or mild pain.   aspirin 81 MG chewable tablet Chew 1 tablet (81 mg total) by mouth daily.   atorvastatin 80 MG tablet Commonly known as:  LIPITOR Take 1 tablet (80 mg total) by mouth daily at 6 PM.   CALCIUM PO Take 1 tablet by mouth daily.   clopidogrel 75 MG tablet Commonly known as:  PLAVIX Take 75 mg by mouth daily.   DULoxetine 60 MG capsule Commonly known as:  CYMBALTA Take 60 mg by mouth daily.   EMGALITY 120 MG/ML Soaj Generic drug:  Galcanezumab-gnlm Inject 120 mg into the  skin every 30 (thirty) days.   FIASP FLEXTOUCH 100 UNIT/ML Sopn Generic drug:  Insulin Aspart (w/Niacinamide) Inject 2-14 Units into the skin See admin instructions. Use three times day as needed before meals per sliding scale   fludrocortisone 0.1 MG tablet Commonly known as:  FLORINEF Take 1 tablet (0.1 mg total) by mouth 2 (two) times daily.   FUSION PLUS Caps Take 1 capsule by mouth daily.   HYDROcodone-acetaminophen 5-325 MG tablet Commonly known as:  NORCO/VICODIN Take 1 tablet by mouth every 6 (six) hours as needed for pain.   hydrocortisone 5 MG tablet Commonly known as:  CORTEF Take 5-15 mg by mouth See admin instructions. Take 15 mg by mouth in the morning and 5 mg in the afternoon   ipratropium-albuterol 0.5-2.5 (3) MG/3ML Soln Commonly known as:  DUONEB Take 3 mLs by nebulization 4 (four) times daily.   levothyroxine 150 MCG tablet Commonly known as:  SYNTHROID, LEVOTHROID Take 150 mcg by mouth daily before breakfast.   Magnesium 400 MG Tabs Take 400 mg by mouth daily.   metoprolol succinate 25 MG 24 hr tablet Commonly known as:  TOPROL-XL Take 0.5 tablets (12.5 mg total) by mouth daily.   nitroGLYCERIN 0.4 MG SL tablet Commonly known as:  NITROSTAT Place 0.4 mg under the tongue every 5 (five) minutes as needed for chest pain.   oxybutynin 15 MG 24 hr tablet Commonly known as:  DITROPAN XL Take 15 mg by mouth at bedtime.   oxyCODONE 5 MG immediate release tablet Commonly known as:  Oxy IR/ROXICODONE Take 5 mg by mouth every 4 (four) hours as needed for pain.   polyethylene glycol packet Commonly known as:  MIRALAX / GLYCOLAX Take 17 g by mouth daily. Start taking on:  12/27/2017   potassium chloride SA 20 MEQ tablet Commonly known as:  K-DUR,KLOR-CON Take 1 tablet (20 mEq total) by mouth daily.   pramipexole 1 MG tablet Commonly known as:  MIRAPEX Take 1 mg by mouth 2 (two) times daily.   PROAIR HFA 108 (90 Base) MCG/ACT inhaler Generic drug:   albuterol Inhale 2 puffs into the lungs See admin instructions. Inhale 2 puffs into the lungs every 4-6 hours as needed for shortness of breath or wheezing   promethazine 12.5 MG tablet Commonly known as:  PHENERGAN Take 12.5 mg by mouth every 6 (six) hours as needed for nausea.   torsemide 20 MG tablet Commonly known as:  DEMADEX Take 2 tablets (40 mg total) by mouth 2 (two) times daily.   TRELEGY ELLIPTA 100-62.5-25 MCG/INH Aepb Generic drug:  Fluticasone-Umeclidin-Vilant Inhale 1 puff into the lungs daily.   TRESIBA FLEXTOUCH 100 UNIT/ML Sopn FlexTouch Pen Generic drug:  insulin degludec Inject 40 Units into the skin at bedtime.   TRULICITY 1.5 QJ/1.9ER Sopn Generic drug:  Dulaglutide Inject 1.5 mg into the skin every Sunday.   Vitamin D3 2000 units capsule Take 2,000 Units by mouth daily.       Disposition and follow-up:   [name] was discharged from [facility] in stable condition.  At the hospital follow up visit please address:  1.  If patient has any symptoms of anemia or GI bleeding. Please note that at discharged, GI agreed to put patient back on ASA and Plavix on 10.02.2019.  2.  Labs / imaging needed at time of follow-up: Hb  3.  Pending labs/ test needing follow-up: none  Follow-up Appointments: Follow-up Information    Dortha Kern, [place]. Call.   Specialty:  Physician Assistant Contact information: [address]  [address] [place] [address] [phone]        Park Liter, MD .   Specialty:  Cardiology Contact information: [place] [address] [place] Follow up.   Why:  home health services Contact information:   [place] by problem list: 1. Anemia secondary to lower GI bleed: Crystal Yates presented on 09/29 with rectal bleeding for five days. She has a history of diverticulitis s/p bowel resection and  hemorrhoids. She had laparotomy for lysis adhesion and release of SBO on 09/05, and had a wound VAC in place at arrival. Her Hgb was 8.1 at presentation on 09/29, decreased from 13.0 on 09/15. She was transfused with 2 units PRBC. CT revealed no abnormality of the colon, no explanation for rectal bleeding, and no small bowel obstruction. We suspected that her lower GI bleeding is a diverticular bleed. GI recommended not performing a colonoscopy due to her recent abdominal surgery. She last noted blood in her bowel movement on the morning on 09/30. Since then, she has had bowel movements without blood. On 09/30, her Hgb had increased to 10.3, and her Hgb was remaining stable upon discharge. She was advanced to regular diet and was tolerating this upon discharge.  2. CAD w/stable angina s/p stent placement: Crystal Yates has a history of CAD with an LAD stent and RCA stent, and was placed on aspirin and Plavix indefinitely on 07/19. Cardio was consulted, and recommended acutely holding her aspirin and Plavix until the source of GI bleed could be identified. Cardio and GI agreed to restart her aspirin and Plavix the day after discharge.  3. CHF: Crystal Yates hasCHF with EF 40-45% on most recent cath on 07/19.At discharge from a CHF exacerbation on 10/27/17, her discharge weight was 99.3 kg (218 lbs), with a goal dry weight of 213 lbs. On 09/30, her weight was 106.7 kg. Her bilateral lower extremities had pitting edema on 09/30, so we discontinued her fluids and will monitor her daily weights.She will be restarted on her home torsemide upon discharge.   4. Diabetes: Crystal Yates was hyperglycemic at presentation with blood glucose of 627. She was started on continuous insulin drip until BG decreased to 145. She was then started on Novolog correction coverage TID with meals, Novolog correction coverage at bedtime, and Lantus 30U daily. Her BG remained controlled on this regimen, with one hypoglycemic  episode on the morning on 10/01 that woke her from sleep. The nurse gave her orange juice and her blood glucose responded appropriately.  5. Adrenal Insufficiency: Continued her home meds while in the hospital - Fludrocortisone 0.1 mg BID and Hydrocortisone 5 mg BID.  6. COPD: Continued her home Duonebs 3 mL nebulized q4h and Trellegy 1 puff daily.  7. Hypothyroidism: Continued her home Levothyroxine 150 mcg qd.  8. Hyperlipidemia: Continued her home Atorvastatin 80 mg qd.  Discharge Vitals:   BP 109/70   Pulse 80   Temp 98 F (36.7 C) (Oral)   Resp 20   Wt 106.7 kg   SpO2 100%   BMI 43.02 kg/m   Pertinent Labs, Studies, and Procedures:  - CT abdomen pelvis w/contrast 09/29 - no explanation for rectal bleeding, no bowl obstruction - Occult blood test - positive  - Trended H&H  Discharge Instructions: Discharge Instructions    Diet - low sodium heart healthy   Complete by:  As directed    Discharge instructions   Complete by:  As directed    It was our pleasure taking care of you in our clinic today. We are glad you feel better now. Please take your medications as instructed  You can restart your Aspirin and Plavix tomorrow. Please feel free to contact us if you have any question or concern and follow up with your primary care doctor in a week.  Thanks, Dr. Linna Hoff   Increase activity slowly   Complete by:  As directed      - Wound Care instruction: Apply Santyl to the 2 top wounds in a nickel thick layer. Cover with a saline moistened gauze, then dry gauze or ABD pad. Change daily. Aquacel Ag+ to the inferior wound daily.  Monitor the wound areas for worsening of condition such as: Signs/symptoms of infection, Increase in size, Development of or worsening of odor, Development of pain, or increased pain at the affected locations.  SignedDewayne Hatch, MD 12/26/2017, 3:56 PM   Pager: [phone]

## 2017-12-26 NOTE — Progress Notes (Signed)
Pt given discharge instructions, prescriptions, and care notes. Pt verbalized understanding AEB no further questions or concerns at this time. IV was discontinued, no redness, pain, or swelling noted at this time. Telemetry discontinued and Centralized Telemetry was notified. Pt left the floor via wheelchair with staff in stable condition. 

## 2017-12-26 NOTE — Progress Notes (Signed)
Subjective: Yesterday, [name]'s wound vacs were removed by the [facility] Nurse, and replaced with wound dressings.  This morning, [name] is feeling much better. She reports that she has not had any blood in her bowel movements since yesterday morning (09/30). Overnight, she had an episode of hypoglycemia (42). She woke up sweating, having difficulty breathing, and feeling anxious. The nurse gave her orange juice and her BG increased to 62. Her BG continued to improve throughout the morning. She reports feeling ready to eat and ready to go home.   Objective: Vital signs in last 24 hours: Vitals:   12/26/17 0825 12/26/17 0826 12/26/17 0913 12/26/17 1227  BP:   124/74 109/70  Pulse:   87 80  Resp:      Temp:   97.6 F (36.4 C) 98 F (36.7 C)  TempSrc:   Oral Oral  SpO2: 96% 96% 100% 100%  Weight:       Weight change:  No intake or output data in the 24 hours ending 12/26/17 1402   General appearance: alert, cooperative, no distress and morbidly obese Lungs: clear to auscultation bilaterally Heart: regular rate and rhythm and S1, S2 normal   Lab Results: H&H trends: Hgb stable at 10.2 HCT stable at 32.0  BUN 7 Cr 0.81  Micro Results: No results found for this or any previous visit (from the past 240 hour(s)).   Studies/Results: - CT abdomen pelvis w/contrast 09/29:Impression: 1. No explanation for rectal bleeding. No abnormality of the colon identified. 2. No bowel obstruction. Small bowel appears normal. 3. Apparent evacuation of ventral abdominal wall abscess. Open wound remains. 4. Decrease in size of presumed RIGHT groin hematoma. 5. Small exophytic lesion from the LEFT kidney again demonstrated.   Medications: I have reviewed the patient's current medications. Scheduled Meds: . atorvastatin  80 mg Oral q1800  . carvedilol  3.125 mg Oral BID WC  . collagenase   Topical Daily  . DULoxetine  60 mg Oral Daily  . fludrocortisone  0.1 mg Oral BID  . fluticasone  furoate-vilanterol  1 puff Inhalation Daily  . hydrocortisone  15 mg Oral BH-q7a  . hydrocortisone  5 mg Oral Q lunch  . insulin aspart  0-20 Units Subcutaneous TID WC  . insulin aspart  0-5 Units Subcutaneous QHS  . insulin glargine  30 Units Subcutaneous Daily  . ipratropium-albuterol  3 mL Nebulization TID  . levothyroxine  150 mcg Oral QAC breakfast  . oxybutynin  15 mg Oral QHS  . polyethylene glycol  17 g Oral Daily  . umeclidinium bromide  1 puff Inhalation Daily   Continuous Infusions: none PRN Meds:.acetaminophen, ipratropium-albuterol, oxyCODONE   Assessment/Plan: Principal Problem:   Lower GI bleed Active Problems:   Hyperglycemia  [name] is a 62 yo female with a past medical history of laparotomy w/lysis of adhesions and release of SBO on 11/30/17, diverticulitis s/p bowel resection (2016), hemorrhoids, CAD, LAD and RCA stents (on Plavix), CHF, diabetes, CKD stage II, secondary adrenal insufficiency, COPD (on 2L [place] at home), HTN, and tobacco use, who presented with anemia secondary to a 5 day history of lower GI bleed.   Anemia 2/2 Lower GI bleed: [name] presented on 09/29 with rectal bleeding for five days. She has a history of diverticulitis s/p bowel resection and hemorrhoids. She had laparotomy for lysis adhesion and release of SBO on 09/05. We suspect that her lower GI bleeding is a diverticular bleed. Her Hgb is remaining stable at 10.2 yesterday evening (  09/30), from 10.3 yesterday morning. She has not had any blood in her bowel movements since the morning of 09/30. Her vitals have remained stable. Due to her stable condition, GI agrees that she is ready for discharge today. - Restart Aspirin and Plavix tomorrow (10/02) as below - Discharge pt on Miralax, per GI  CAD w/stable angina s/p stent placement: [name] has a history of CAD with an LAD stent and RCA stent and was placed on aspirin and Plavix indefinitely. Cardio was consulted on 09/29 and  recommended acutely holding both aspirin and Plavix, until source of GI bleeding could be determined. We discussed the plan for DAT with Cardio today, and they would like her to restart her aspirin and Plavix upon discharge.   CHF: [name] has CHF with EF 40-45% on most recent cath on 07/19. At discharge from a CHF exacerbation on 10/27/17, her discharge weight was 99.3 kg (218 lbs), with a goal dry weight of 213 lbs. Yesterday, her weight was 106.7 kg, and she had 3+ pitting edema in bilateral lower extremities. She will restart her home Torsemide upon discharge.  Diabetes: After her episode of hypoglycemia this morning, her blood sugar returned to normal after drinking orange juice. Otherwise, her blood sugar has remained controlled on Novolog correction coverage TID with meals, Novolog correction coverage at bedtime, and Lantus 30U daily. She will discharged on her home insulin regimen.   Adrenal Insufficiency: Continue on her home meds - Fludrocortisone 0.1 mg BID and Hydrocortisone 5 mg BID.   COPD: Continue home Duonebs 3 mL nebulized q4h and Fluticasone-Umeclidin-Vilant 1 puff daily.  Hypothyroidism: Continue home Levothyroxine 150 mcg qd.  Hyperlipidemia: Continue home Atorvastatin 80 mg qd.  Fluids: none Electrolytes: monitor and replace as needed   Diet:regular diet DVT ppx: SCDs Code: Full      Dispo: Discharge today  This is a Careers information officer Note.  The care of the patient was discussed with Dr. Myrtie Yates and the assessment and plan formulated with their assistance.  Please see their attached note for official documentation of the daily encounter.   LOS: 2 days   Crystal Yates, Medical Student 12/26/2017, 2:26 PM  I have seen and examined the patient, and reviewed the daily progress note by Crystal Polio, MS and discussed the care of the patient with them.   SignedDewayne Hatch, MD 12/26/2017, 5:04 PM

## 2017-12-26 NOTE — Discharge Instructions (Addendum)
Wound care plan: Apply Santyl to the 2 top wounds in a nickel thick layer. Cover with a saline moistened gauze, then dry gauze or ABD pad.  Change daily. Aquacel Ag+ to the inferior wound daily.  Monitor the wound area(s) for worsening of condition such as: Signs/symptoms of infection,  Increase in size,  Development of or worsening of odor, Development of pain, or increased pain at the affected locations.  Notify the medical team if any of these develop.  Thanks

## 2017-12-26 NOTE — Progress Notes (Signed)
[facility] PROGRESS NOTE Subjective Patient has had 2 bowel movements since admission with no blood.  Stools are soft and loose with MiraLAX.  Objective: Vital signs in last 24 hours: Temp:  [97.6 F (36.4 C)-98.9 F (37.2 C)] 97.6 F (36.4 C) (10/01 0913) Pulse Rate:  [84-95] 87 (10/01 0913) Resp:  [18-20] 20 (10/01 0446) BP: (110-142)/(47-82) 124/74 (10/01 0913) SpO2:  [96 %-100 %] 100 % (10/01 0913) Last BM Date: 12/25/17  Intake/Output from previous day: 09/30 0701 - 10/01 0700 In: 500 [P.O.:500] Out: -  Intake/Output this shift: No intake/output data recorded.    Lab Results: Recent Labs    12/24/17 0451 12/24/17 1950 12/25/17 0239 12/25/17 0547 12/25/17 1839  WBC 7.6  --   --   --   --   HGB 8.1* 10.3* 10.8* 10.3* 10.2*  HCT 26.2* 31.5* 33.1* 32.0* 32.0*  PLT 248  --   --   --   --    BMET Recent Labs    12/24/17 0451 12/25/17 0547  NA 135 137  K 3.6 3.2*  CL 88* 96*  CO2 38* 33*  CREATININE 1.05* 0.81   LFT Recent Labs    12/24/17 0451  PROT 5.3*  AST 21  ALT 38  ALKPHOS 93  BILITOT 0.3   PT/INR No results for input(s): LABPROT, INR in the last 72 hours. PANCREAS No results for input(s): LIPASE in the last 72 hours.       Studies/Results: No results found.  Medications: I have reviewed the patient's current medications.  Assessment:   1.  Lower GI bleed.  Probably diverticular.  Seems to have stopped off of antiplatelet agents. 2.  Recent SBO.  Had surgery with lysis of adhesions within the past couple weeks and has a wound infection requiring VAC.   Plan: At this point we will keep her on MiraLAX to keep her stools soft and have her adjust the dosing to keep her stools consistently soft without being too loose.  Would resume antiplatelet agents in the next several days. We will sign off please call us if there are further problems   Nancy Fetter 12/26/2017, 10:05 AM  This note was created using voice  recognition software. Minor errors may Have occurred unintentionally.  Pager: [phone] If no answer or after hours call [phone]

## 2017-12-27 ENCOUNTER — Encounter (HOSPITAL_COMMUNITY): Payer: Self-pay | Admitting: Emergency Medicine

## 2017-12-27 ENCOUNTER — Emergency Department (HOSPITAL_COMMUNITY): Payer: Federal, State, Local not specified - PPO

## 2017-12-27 ENCOUNTER — Other Ambulatory Visit: Payer: Self-pay

## 2017-12-27 ENCOUNTER — Inpatient Hospital Stay (HOSPITAL_COMMUNITY)
Admission: EM | Admit: 2017-12-27 | Discharge: 2018-01-03 | Disposition: A | Discharge status: Home / Self Care | Payer: Federal, State, Local not specified - PPO | Source: Home / Self Care | Attending: Internal Medicine | Admitting: Internal Medicine

## 2017-12-27 DIAGNOSIS — R457 State of emotional shock and stress, unspecified: Secondary | ICD-10-CM | POA: Diagnosis not present

## 2017-12-27 DIAGNOSIS — Z9981 Dependence on supplemental oxygen: Secondary | ICD-10-CM

## 2017-12-27 DIAGNOSIS — N182 Chronic kidney disease, stage 2 (mild): Secondary | ICD-10-CM | POA: Diagnosis not present

## 2017-12-27 DIAGNOSIS — J449 Chronic obstructive pulmonary disease, unspecified: Secondary | ICD-10-CM

## 2017-12-27 DIAGNOSIS — Z79890 Hormone replacement therapy (postmenopausal): Secondary | ICD-10-CM

## 2017-12-27 DIAGNOSIS — I25118 Atherosclerotic heart disease of native coronary artery with other forms of angina pectoris: Secondary | ICD-10-CM

## 2017-12-27 DIAGNOSIS — R0602 Shortness of breath: Secondary | ICD-10-CM | POA: Diagnosis not present

## 2017-12-27 DIAGNOSIS — Z79899 Other long term (current) drug therapy: Secondary | ICD-10-CM

## 2017-12-27 DIAGNOSIS — I5023 Acute on chronic systolic (congestive) heart failure: Secondary | ICD-10-CM | POA: Diagnosis not present

## 2017-12-27 DIAGNOSIS — E1122 Type 2 diabetes mellitus with diabetic chronic kidney disease: Secondary | ICD-10-CM | POA: Diagnosis not present

## 2017-12-27 DIAGNOSIS — R739 Hyperglycemia, unspecified: Secondary | ICD-10-CM

## 2017-12-27 DIAGNOSIS — E039 Hypothyroidism, unspecified: Secondary | ICD-10-CM

## 2017-12-27 DIAGNOSIS — R32 Unspecified urinary incontinence: Secondary | ICD-10-CM

## 2017-12-27 DIAGNOSIS — I130 Hypertensive heart and chronic kidney disease with heart failure and stage 1 through stage 4 chronic kidney disease, or unspecified chronic kidney disease: Secondary | ICD-10-CM

## 2017-12-27 DIAGNOSIS — I110 Hypertensive heart disease with heart failure: Secondary | ICD-10-CM | POA: Diagnosis not present

## 2017-12-27 DIAGNOSIS — R0989 Other specified symptoms and signs involving the circulatory and respiratory systems: Secondary | ICD-10-CM

## 2017-12-27 DIAGNOSIS — F17210 Nicotine dependence, cigarettes, uncomplicated: Secondary | ICD-10-CM

## 2017-12-27 DIAGNOSIS — J9610 Chronic respiratory failure, unspecified whether with hypoxia or hypercapnia: Secondary | ICD-10-CM

## 2017-12-27 DIAGNOSIS — J189 Pneumonia, unspecified organism: Secondary | ICD-10-CM

## 2017-12-27 DIAGNOSIS — Z888 Allergy status to other drugs, medicaments and biological substances status: Secondary | ICD-10-CM

## 2017-12-27 DIAGNOSIS — Z955 Presence of coronary angioplasty implant and graft: Secondary | ICD-10-CM

## 2017-12-27 DIAGNOSIS — Z91048 Other nonmedicinal substance allergy status: Secondary | ICD-10-CM

## 2017-12-27 DIAGNOSIS — E1165 Type 2 diabetes mellitus with hyperglycemia: Secondary | ICD-10-CM | POA: Diagnosis not present

## 2017-12-27 DIAGNOSIS — Z8719 Personal history of other diseases of the digestive system: Secondary | ICD-10-CM

## 2017-12-27 DIAGNOSIS — I509 Heart failure, unspecified: Secondary | ICD-10-CM | POA: Diagnosis not present

## 2017-12-27 DIAGNOSIS — Z794 Long term (current) use of insulin: Secondary | ICD-10-CM

## 2017-12-27 DIAGNOSIS — Z833 Family history of diabetes mellitus: Secondary | ICD-10-CM

## 2017-12-27 DIAGNOSIS — Z9889 Other specified postprocedural states: Secondary | ICD-10-CM

## 2017-12-27 DIAGNOSIS — E2740 Unspecified adrenocortical insufficiency: Secondary | ICD-10-CM

## 2017-12-27 DIAGNOSIS — Z8249 Family history of ischemic heart disease and other diseases of the circulatory system: Secondary | ICD-10-CM

## 2017-12-27 HISTORY — DX: Unspecified osteoarthritis, unspecified site: M19.90

## 2017-12-27 LAB — BRAIN NATRIURETIC PEPTIDE: B Natriuretic Peptide: 624.1 pg/mL — ABNORMAL HIGH (ref 0.0–100.0)

## 2017-12-27 LAB — BPAM RBC
BLOOD PRODUCT EXPIRATION DATE: 201910102359
BLOOD PRODUCT EXPIRATION DATE: 201910252359
Blood Product Expiration Date: 201910072359
Blood Product Expiration Date: 201910102359
ISSUE DATE / TIME: 201909121111
ISSUE DATE / TIME: 201909191048
ISSUE DATE / TIME: 201909290857
ISSUE DATE / TIME: 201909291247
UNIT TYPE AND RH: 6200
Unit Type and Rh: 6200
Unit Type and Rh: 6200
Unit Type and Rh: 6200

## 2017-12-27 LAB — CBC WITH DIFFERENTIAL/PLATELET
Abs Immature Granulocytes: 0.1 10*3/uL (ref 0.0–0.1)
Basophils Absolute: 0.1 10*3/uL (ref 0.0–0.1)
Basophils Relative: 1 %
EOS ABS: 0.1 10*3/uL (ref 0.0–0.7)
EOS PCT: 1 %
HCT: 32.2 % — ABNORMAL LOW (ref 36.0–46.0)
Hemoglobin: 9.7 g/dL — ABNORMAL LOW (ref 12.0–15.0)
Immature Granulocytes: 1 %
LYMPHS ABS: 1.2 10*3/uL (ref 0.7–4.0)
Lymphocytes Relative: 13 %
MCH: 28.1 pg (ref 26.0–34.0)
MCHC: 30.1 g/dL (ref 30.0–36.0)
MCV: 93.3 fL (ref 78.0–100.0)
Monocytes Absolute: 0.6 10*3/uL (ref 0.1–1.0)
Monocytes Relative: 7 %
Neutro Abs: 6.9 10*3/uL (ref 1.7–7.7)
Neutrophils Relative %: 77 %
Platelets: 251 10*3/uL (ref 150–400)
RBC: 3.45 MIL/uL — AB (ref 3.87–5.11)
RDW: 16.5 % — AB (ref 11.5–15.5)
WBC: 8.9 10*3/uL (ref 4.0–10.5)

## 2017-12-27 LAB — TYPE AND SCREEN
ABO/RH(D): A POS
Antibody Screen: NEGATIVE
UNIT DIVISION: 0
UNIT DIVISION: 0
Unit division: 0
Unit division: 0

## 2017-12-27 LAB — URINALYSIS, ROUTINE W REFLEX MICROSCOPIC
Bilirubin Urine: NEGATIVE
GLUCOSE, UA: NEGATIVE mg/dL
Hgb urine dipstick: NEGATIVE
Ketones, ur: NEGATIVE mg/dL
Leukocytes, UA: NEGATIVE
Nitrite: NEGATIVE
PH: 7 (ref 5.0–8.0)
Protein, ur: NEGATIVE mg/dL
SPECIFIC GRAVITY, URINE: 1.004 — AB (ref 1.005–1.030)

## 2017-12-27 LAB — COMPREHENSIVE METABOLIC PANEL
ALK PHOS: 109 U/L (ref 38–126)
ALT: 50 U/L — AB (ref 0–44)
AST: 67 U/L — AB (ref 15–41)
Albumin: 2.2 g/dL — ABNORMAL LOW (ref 3.5–5.0)
Anion gap: 8 (ref 5–15)
BILIRUBIN TOTAL: 0.3 mg/dL (ref 0.3–1.2)
BUN: 11 mg/dL (ref 8–23)
CHLORIDE: 96 mmol/L — AB (ref 98–111)
CO2: 34 mmol/L — ABNORMAL HIGH (ref 22–32)
CREATININE: 0.96 mg/dL (ref 0.44–1.00)
Calcium: 8.9 mg/dL (ref 8.9–10.3)
GFR calc Af Amer: >60 mL/min (ref >60)
Glucose, Bld: 301 mg/dL — ABNORMAL HIGH (ref 70–99)
Potassium: 4.3 mmol/L (ref 3.5–5.1)
Sodium: 138 mmol/L (ref 135–145)
TOTAL PROTEIN: 5.6 g/dL — AB (ref 6.5–8.1)

## 2017-12-27 LAB — TROPONIN I
Troponin I: 0.03 ng/mL (ref <0.03)
Troponin I: 0.04 ng/mL (ref <0.03)

## 2017-12-27 LAB — I-STAT TROPONIN, ED: TROPONIN I, POC: 0.02 ng/mL (ref 0.00–0.08)

## 2017-12-27 LAB — GLUCOSE, CAPILLARY
GLUCOSE-CAPILLARY: 180 mg/dL — AB (ref 70–99)
Glucose-Capillary: 195 mg/dL — ABNORMAL HIGH (ref 70–99)

## 2017-12-27 MED ORDER — HYDROCORTISONE 5 MG PO TABS
15.0000 mg | ORAL_TABLET | Freq: Every day | ORAL | Status: DC
  Administered 2017-12-28 – 2018-01-03 (×7): 15 mg via ORAL
  Filled 2017-12-27 (×7): qty 1

## 2017-12-27 MED ORDER — INSULIN DEGLUDEC 100 UNIT/ML ~~LOC~~ SOPN: 40.0000 [IU] | PEN_INJECTOR | Freq: Every day | SUBCUTANEOUS | Status: DC

## 2017-12-27 MED ORDER — HYDROCORTISONE 5 MG PO TABS
5.0000 mg | ORAL_TABLET | Freq: Every day | ORAL | Status: DC
  Administered 2017-12-28 – 2018-01-02 (×6): 5 mg via ORAL
  Filled 2017-12-27 (×8): qty 1

## 2017-12-27 MED ORDER — ATORVASTATIN CALCIUM 20 MG PO TABS
80.0000 mg | ORAL_TABLET | Freq: Every day | ORAL | Status: DC
  Administered 2017-12-27 – 2018-01-02 (×7): 80 mg via ORAL
  Filled 2017-12-27 (×7): qty 4

## 2017-12-27 MED ORDER — PRAMIPEXOLE DIHYDROCHLORIDE 1 MG PO TABS
1.0000 mg | ORAL_TABLET | Freq: Two times a day (BID) | ORAL | Status: DC
  Administered 2017-12-27 – 2018-01-03 (×14): 1 mg via ORAL
  Filled 2017-12-27 (×15): qty 1

## 2017-12-27 MED ORDER — SODIUM CHLORIDE 0.9 % IV SOLN
INTRAVENOUS | Status: DC | PRN
  Administered 2017-12-27 – 2018-01-02 (×2): 250 mL via INTRAVENOUS

## 2017-12-27 MED ORDER — SODIUM CHLORIDE 0.9 % IV SOLN
1.0000 g | Freq: Three times a day (TID) | INTRAVENOUS | Status: DC
  Filled 2017-12-27: qty 1

## 2017-12-27 MED ORDER — FUROSEMIDE 10 MG/ML IJ SOLN: 60.0000 mg | Freq: Two times a day (BID) | INTRAMUSCULAR | Status: DC

## 2017-12-27 MED ORDER — POLYETHYLENE GLYCOL 3350 17 G PO PACK: 17.0000 g | PACK | Freq: Every day | ORAL | Status: DC | PRN

## 2017-12-27 MED ORDER — UMECLIDINIUM BROMIDE 62.5 MCG/INH IN AEPB
1.0000 | INHALATION_SPRAY | Freq: Every day | RESPIRATORY_TRACT | Status: DC
  Administered 2017-12-28 – 2018-01-03 (×7): 1 via RESPIRATORY_TRACT
  Filled 2017-12-27: qty 7

## 2017-12-27 MED ORDER — FLUDROCORTISONE ACETATE 0.1 MG PO TABS
0.1000 mg | ORAL_TABLET | Freq: Two times a day (BID) | ORAL | Status: DC
  Administered 2017-12-27 – 2018-01-03 (×14): 0.1 mg via ORAL
  Filled 2017-12-27 (×15): qty 1

## 2017-12-27 MED ORDER — CLOPIDOGREL BISULFATE 75 MG PO TABS
75.0000 mg | ORAL_TABLET | Freq: Every day | ORAL | Status: DC
  Administered 2017-12-27 – 2018-01-03 (×8): 75 mg via ORAL
  Filled 2017-12-27 (×8): qty 1

## 2017-12-27 MED ORDER — ACETAMINOPHEN 650 MG RE SUPP: 650.0000 mg | Freq: Four times a day (QID) | RECTAL | Status: DC | PRN

## 2017-12-27 MED ORDER — FUSION PLUS PO CAPS: 1.0000 | ORAL_CAPSULE | Freq: Every day | ORAL | Status: DC

## 2017-12-27 MED ORDER — IPRATROPIUM-ALBUTEROL 0.5-2.5 (3) MG/3ML IN SOLN
3.0000 mL | Freq: Once | RESPIRATORY_TRACT | Status: AC
  Administered 2017-12-27: 3 mL via RESPIRATORY_TRACT
  Filled 2017-12-27: qty 3

## 2017-12-27 MED ORDER — ALBUTEROL SULFATE (2.5 MG/3ML) 0.083% IN NEBU
3.0000 mL | INHALATION_SOLUTION | RESPIRATORY_TRACT | Status: DC | PRN
  Administered 2017-12-27 (×2): 3 mL via RESPIRATORY_TRACT
  Filled 2017-12-27 (×2): qty 3

## 2017-12-27 MED ORDER — HYDROCORTISONE 5 MG PO TABS: 5.0000 mg | ORAL_TABLET | ORAL | Status: DC

## 2017-12-27 MED ORDER — ACETAMINOPHEN 325 MG PO TABS
650.0000 mg | ORAL_TABLET | Freq: Four times a day (QID) | ORAL | Status: DC | PRN
  Administered 2017-12-27 – 2017-12-29 (×3): 650 mg via ORAL
  Filled 2017-12-27 (×3): qty 2

## 2017-12-27 MED ORDER — NITROGLYCERIN 0.4 MG SL SUBL: 0.4000 mg | SUBLINGUAL_TABLET | SUBLINGUAL | Status: DC | PRN

## 2017-12-27 MED ORDER — VANCOMYCIN HCL IN DEXTROSE 1-5 GM/200ML-% IV SOLN
1000.0000 mg | Freq: Once | INTRAVENOUS | Status: AC
  Administered 2017-12-27: 1000 mg via INTRAVENOUS
  Filled 2017-12-27: qty 200

## 2017-12-27 MED ORDER — INSULIN ASPART 100 UNIT/ML ~~LOC~~ SOLN
0.0000 [IU] | Freq: Three times a day (TID) | SUBCUTANEOUS | Status: DC
  Administered 2017-12-27: 3 [IU] via SUBCUTANEOUS
  Administered 2017-12-28: 2 [IU] via SUBCUTANEOUS
  Administered 2017-12-28: 11 [IU] via SUBCUTANEOUS
  Administered 2017-12-29: 3 [IU] via SUBCUTANEOUS
  Administered 2017-12-29: 2 [IU] via SUBCUTANEOUS
  Administered 2017-12-29: 8 [IU] via SUBCUTANEOUS
  Administered 2017-12-30: 5 [IU] via SUBCUTANEOUS
  Administered 2017-12-30: 8 [IU] via SUBCUTANEOUS
  Administered 2017-12-31: 2 [IU] via SUBCUTANEOUS
  Administered 2017-12-31: 5 [IU] via SUBCUTANEOUS
  Administered 2018-01-01: 1 [IU] via SUBCUTANEOUS
  Administered 2018-01-02: 5 [IU] via SUBCUTANEOUS
  Administered 2018-01-03 (×2): 3 [IU] via SUBCUTANEOUS

## 2017-12-27 MED ORDER — LEVOTHYROXINE SODIUM 75 MCG PO TABS
150.0000 ug | ORAL_TABLET | Freq: Every day | ORAL | Status: DC
  Administered 2017-12-28 – 2018-01-03 (×7): 150 ug via ORAL
  Filled 2017-12-27 (×7): qty 2

## 2017-12-27 MED ORDER — INSULIN ASPART 100 UNIT/ML ~~LOC~~ SOLN
0.0000 [IU] | Freq: Every day | SUBCUTANEOUS | Status: DC
  Administered 2017-12-28: 4 [IU] via SUBCUTANEOUS
  Administered 2017-12-31: 2 [IU] via SUBCUTANEOUS

## 2017-12-27 MED ORDER — FUROSEMIDE 10 MG/ML IJ SOLN
40.0000 mg | INTRAMUSCULAR | Status: AC
  Administered 2017-12-27: 40 mg via INTRAVENOUS
  Filled 2017-12-27: qty 4

## 2017-12-27 MED ORDER — FLUTICASONE-UMECLIDIN-VILANT 100-62.5-25 MCG/INH IN AEPB: 1.0000 | INHALATION_SPRAY | Freq: Every day | RESPIRATORY_TRACT | Status: DC

## 2017-12-27 MED ORDER — METOPROLOL SUCCINATE 12.5 MG HALF TABLET
12.5000 mg | ORAL_TABLET | Freq: Every day | ORAL | Status: DC
  Administered 2017-12-28 – 2018-01-03 (×7): 12.5 mg via ORAL
  Filled 2017-12-27 (×9): qty 1

## 2017-12-27 MED ORDER — HYDROCODONE-ACETAMINOPHEN 5-325 MG PO TABS: 1.0000 | ORAL_TABLET | Freq: Four times a day (QID) | ORAL | Status: DC | PRN

## 2017-12-27 MED ORDER — IPRATROPIUM-ALBUTEROL 0.5-2.5 (3) MG/3ML IN SOLN
3.0000 mL | Freq: Four times a day (QID) | RESPIRATORY_TRACT | Status: DC
  Administered 2017-12-28 – 2017-12-29 (×5): 3 mL via RESPIRATORY_TRACT
  Filled 2017-12-27 (×6): qty 3

## 2017-12-27 MED ORDER — ENOXAPARIN SODIUM 40 MG/0.4ML ~~LOC~~ SOLN: 40.0000 mg | SUBCUTANEOUS | Status: DC

## 2017-12-27 MED ORDER — SODIUM CHLORIDE 0.9 % IV SOLN
1.0000 g | Freq: Once | INTRAVENOUS | Status: AC
  Administered 2017-12-27: 1 g via INTRAVENOUS
  Filled 2017-12-27: qty 1

## 2017-12-27 MED ORDER — SODIUM CHLORIDE 0.9 % IV SOLN
INTRAVENOUS | Status: DC | PRN
  Administered 2017-12-27: 250 mL via INTRAVENOUS

## 2017-12-27 MED ORDER — DULOXETINE HCL 60 MG PO CPEP
60.0000 mg | ORAL_CAPSULE | Freq: Every day | ORAL | Status: DC
  Administered 2017-12-27 – 2018-01-03 (×8): 60 mg via ORAL
  Filled 2017-12-27 (×8): qty 1

## 2017-12-27 MED ORDER — FUROSEMIDE 10 MG/ML IJ SOLN
60.0000 mg | Freq: Once | INTRAMUSCULAR | Status: AC
  Administered 2017-12-27: 60 mg via INTRAVENOUS
  Filled 2017-12-27: qty 6

## 2017-12-27 MED ORDER — INSULIN GLARGINE 100 UNIT/ML ~~LOC~~ SOLN
40.0000 [IU] | Freq: Every day | SUBCUTANEOUS | Status: DC
  Administered 2017-12-27 – 2017-12-31 (×5): 40 [IU] via SUBCUTANEOUS
  Filled 2017-12-27 (×5): qty 0.4

## 2017-12-27 MED ORDER — ASPIRIN 81 MG PO CHEW
81.0000 mg | CHEWABLE_TABLET | Freq: Every day | ORAL | Status: DC
  Administered 2017-12-28 – 2018-01-03 (×7): 81 mg via ORAL
  Filled 2017-12-27 (×7): qty 1

## 2017-12-27 MED ORDER — VANCOMYCIN HCL IN DEXTROSE 750-5 MG/150ML-% IV SOLN: 750.0000 mg | Freq: Two times a day (BID) | INTRAVENOUS | Status: DC

## 2017-12-27 MED ORDER — FLUTICASONE FUROATE-VILANTEROL 100-25 MCG/INH IN AEPB
1.0000 | INHALATION_SPRAY | Freq: Every day | RESPIRATORY_TRACT | Status: DC
  Administered 2017-12-28 – 2018-01-03 (×7): 1 via RESPIRATORY_TRACT
  Filled 2017-12-27: qty 28

## 2017-12-27 NOTE — Progress Notes (Signed)
CRITICAL VALUE ALERT  Critical Value:  0.03 Troponin  Date & Time Notied:  12/27/17 1730  Provider Notified: [facility]  Orders Received/Actions taken: none.

## 2017-12-27 NOTE — Progress Notes (Signed)
Call placed to respiratory to request PRN albuterol per pt insisting it is necessary. Pt able to answer phone call and articulate location to family without difficulty during frantic expression of need.   RT states they are not available at this time. CN seeking proper nebulizer supplies/med to administer.

## 2017-12-27 NOTE — ED Notes (Signed)
Attempted IV X2, unable to obtain.

## 2017-12-27 NOTE — ED Triage Notes (Signed)
BIB [facility] from home with c/o of incontinence that leads pt to experience an anxiety attack w/ SOB.

## 2017-12-27 NOTE — ED Notes (Signed)
Notified pharmacy to verify and retime home medications.

## 2017-12-27 NOTE — ED Notes (Signed)
Patient transported to X-ray 

## 2017-12-27 NOTE — ED Provider Notes (Signed)
[facility] EMERGENCY DEPARTMENT Provider Note   CSN: [id number] Arrival date & time: 12/27/17  0102     History   Chief Complaint Chief Complaint  Patient presents with  . Shortness of Breath  . Anxiety    HPI Crystal Yates is a 62 y.o. female.  62yo F w/ PMH including CHF, CAD, COPD on 2L home O2, CKD, HTN, recent GI bleed who p/w shortness of breath.  The patient was discharged from the hospital yesterday after admission for GI bleed during which she was transfused 2 units of blood.  Family member notes that for 4 days total including hospitalization, she has not had her Lasix. She notes severe LE edema b/l that is not usual for her.  Overnight last night, she had 2 episodes of urinary incontinence and one other episode where she got up and went to the bathroom to urinate.  During these times, she noted that she was short of breath.  During 1 of the episodes, she had some associated chest pressure and diaphoresis that resolved when the shortness of breath improved.  Shortness of breath is worse with exertion.  She denies any dysuria.  No new abdominal pain.  No bloody stools since discharge.  The history is provided by the patient and a relative.  Shortness of Breath   Anxiety  Associated symptoms include shortness of breath.    Past Medical History:  Diagnosis Date  . Acute on chronic systolic heart failure exacerbation(HCC) 04/08/2016  . CAD in native artery    a. Prior LAD stenting based on cath. b. RCA stenting 03/2016 x2.  . Chronic combined systolic and diastolic CHF (congestive heart failure) ([place])   . CKD (chronic kidney disease), stage II   . COPD (chronic obstructive pulmonary disease) ([place])   . Diabetes mellitus without complication ([place])   . Dyspnea   . Hashimoto's thyroiditis   . Hyperlipidemia   . Hypertension   . Myocardial infarction ([place])   . On home oxygen therapy    "2L; 24/7" (10/23/2017)  . Secondary adrenal insufficiency  ([place])   . Thyroid disease   . Tobacco abuse     Patient Active Problem List   Diagnosis Date Noted  . Lower GI bleed 12/24/2017  . Hyperglycemia   . Coronary stent restenosis due to progression of disease   . Volume overload   . Pseudoaneurysm following procedure ([place])   . Pseudoaneurysm ([place]) 10/17/2017  . S/P angioplasty for ISR of dRCA 10/13/17 10/14/2017  . CAD (coronary artery disease) 10/13/2017  . Preop examination 10/10/2017  . Acute respiratory distress 09/16/2017  . Right upper lobe pneumonia ([place]) 05/06/2017  . Pneumonia due to respiratory syncytial virus (RSV) 04/21/2017  . Entrapment, radial nerve, right 03/31/2017  . COPD exacerbation ([place]) 10/14/2016  . Chronic combined systolic and diastolic CHF (congestive heart failure) ([place]) 10/13/2016  . Chest tightness or pressure 10/12/2016  . Dyspnea on exertion 09/29/2016  . History of coronary artery disease 09/29/2016  . OAB (overactive bladder) 09/21/2016  . Neck pain 09/08/2016  . Paresthesia of arm 09/08/2016  . Flank pain 08/31/2016  . Oxygen dependent 06/20/2016  . Neuralgia of right upper extremity 04/26/2016  . Coronary artery disease 04/16/2016  . Lymphocele after surgical procedure 04/16/2016  . Ventricular tachycardia (paroxysmal) ([place]) 04/09/2016  . Acute on chronic systolic heart failure exacerbation(HCC) 04/08/2016  . Ischemic cardiomyopathy 04/08/2016  . Type 2 diabetes mellitus without complication, with long-term current use of insulin ([place]) 04/08/2016  .  Anemia, blood loss 09/16/2015  . GI bleeding 09/16/2015  . Chest pain 05/22/2015  . Hypokalemia 05/22/2015  . Hypothyroid 05/22/2015  . Status post hernia repair 01/29/2015  . Surgical wound breakdown 01/29/2015  . Hernia with strangulation 01/18/2015  . Tobacco abuse 01/18/2015  . Recurrent incisional hernia with incarceration 01/15/2015  . Sleep apnea 01/15/2015  . S/P drug eluting coronary stent placement 10/16/2014  . Adrenal insufficiency  ([place]) 11/21/2013  . Anemia 11/21/2013  . Unstable angina ([place]) 11/21/2013  . Atherosclerotic heart disease of native coronary artery without angina pectoris 11/21/2013  . Cardiomyopathy ([place]) 11/21/2013  . Congestive heart failure ([place]) 11/21/2013  . Diabetes mellitus with no complication ([place]) 87/86/7672  . Edema 11/21/2013  . Empty sella syndrome ([place]) 11/21/2013  . Hepatitis 11/21/2013  . Hyperlipemia 11/21/2013  . Hypertension 11/21/2013  . Kidney cyst, acquired 11/21/2013  . Lymph nodes enlarged 11/21/2013  . Fat necrosis 11/21/2013  . Pulmonary emphysema ([place]) 11/21/2013  . Renal disorder 11/21/2013  . Shortness of breath 11/21/2013  . Sjogrens syndrome ([place]) 11/21/2013  . Thyroid disorder 11/21/2013  . Nicotine dependence, uncomplicated 09/47/0962  . Major depressive disorder, recurrent episode ([place]) 09/17/2013  . Displacement of lumbar intervertebral disc 06/14/2013  . Intractable migraine without aura 06/14/2013  . Mild cognitive impairment 06/14/2013  . Polyneuropathy in diabetes ([place]) 06/14/2013  . Secondary adrenal insufficiency ([place]) 04/12/2011  . DDD (degenerative disc disease), lumbosacral 01/11/2008  . Thoracic or lumbosacral neuritis or radiculitis 01/11/2008    Past Surgical History:  Procedure Laterality Date  . ABDOMINAL SURGERY    . CESAREAN SECTION    . CHOLECYSTECTOMY    . COLON RESECTION    . CORONARY BALLOON ANGIOPLASTY N/A 10/13/2017   Procedure: CORONARY BALLOON ANGIOPLASTY;  Surgeon: Belva Crome, MD;  Location: [facility];  Service: Cardiovascular;  Laterality: N/A;  . HERNIA MESH REMOVAL    . HERNIA REPAIR    . LEFT HEART CATH AND CORONARY ANGIOGRAPHY N/A 10/13/2016   Procedure: Left Heart Cath and Coronary Angiography;  Surgeon: Nelva Bush, MD;  Location: [facility];  Service: Cardiovascular;  Laterality: N/A;  . RIGHT/LEFT HEART CATH AND CORONARY ANGIOGRAPHY N/A 10/13/2017   Procedure: RIGHT/LEFT HEART CATH AND CORONARY  ANGIOGRAPHY;  Surgeon: Belva Crome, MD;  Location: [facility];  Service: Cardiovascular;  Laterality: N/A;  . SHOULDER ARTHROSCOPY    . TUBAL LIGATION       OB History   None      Home Medications    Prior to Admission medications   Medication Sig Start Date End Date Taking? Authorizing Provider  acetaminophen (TYLENOL) 325 MG tablet Take 2 tablets (650 mg total) by mouth every 4 (four) hours as needed for headache or mild pain. 10/14/17  Yes Isaiah Serge, NP  albuterol (PROAIR HFA) 108 (90 Base) MCG/ACT inhaler Inhale 2 puffs into the lungs See admin instructions. Inhale 2 puffs into the lungs every 4-6 hours as needed for shortness of breath or wheezing 07/24/17  Yes [provider]  aspirin 81 MG chewable tablet Chew 1 tablet (81 mg total) by mouth daily. 10/14/17  Yes Isaiah Serge, NP  atorvastatin (LIPITOR) 80 MG tablet Take 1 tablet (80 mg total) by mouth daily at 6 PM. 10/14/17  Yes Isaiah Serge, NP  CALCIUM PO Take 1 tablet by mouth daily.   Yes [provider]  Cholecalciferol (VITAMIN D3) 2000 units capsule Take 2,000 Units by mouth daily.  Yes [provider]  clopidogrel (PLAVIX) 75 MG tablet Take 75 mg by mouth daily.   Yes [provider]  DULoxetine (CYMBALTA) 60 MG capsule Take 60 mg by mouth daily.  12/05/16  Yes [provider]  EMGALITY 120 MG/ML SOAJ Inject 120 mg into the skin every 30 (thirty) days.  07/30/17  Yes [provider]  fludrocortisone (FLORINEF) 0.1 MG tablet Take 1 tablet (0.1 mg total) by mouth 2 (two) times daily. 10/26/17  Yes Neva Seat, MD  HYDROcodone-acetaminophen (NORCO/VICODIN) 5-325 MG tablet Take 1 tablet by mouth every 6 (six) hours as needed for pain. 12/15/17  Yes [provider]  hydrocortisone (CORTEF) 5 MG tablet Take 5-15 mg by mouth See admin instructions. Take 15 mg by mouth in the morning and 5 mg in the afternoon   Yes [provider]  Insulin  Aspart, w/Niacinamide, (FIASP FLEXTOUCH) 100 UNIT/ML SOPN Inject 2-14 Units into the skin See admin instructions. Use three times day as needed before meals per sliding scale   Yes [provider]  insulin degludec (TRESIBA FLEXTOUCH) 100 UNIT/ML SOPN FlexTouch Pen Inject 40 Units into the skin at bedtime.  08/28/16  Yes [provider]  ipratropium-albuterol (DUONEB) 0.5-2.5 (3) MG/3ML SOLN Take 3 mLs by nebulization 4 (four) times daily.   Yes [provider]  Iron-FA-B Cmp-C-Biot-Probiotic (FUSION PLUS) CAPS Take 1 capsule by mouth daily. 10/09/16  Yes [provider]  levothyroxine (SYNTHROID, LEVOTHROID) 150 MCG tablet Take 150 mcg by mouth daily before breakfast.   Yes [provider]  Magnesium 400 MG TABS Take 400 mg by mouth daily.    Yes [provider]  metoprolol succinate (TOPROL-XL) 25 MG 24 hr tablet Take 0.5 tablets (12.5 mg total) by mouth daily. 10/27/17  Yes Ledell Noss, MD  nitroGLYCERIN (NITROSTAT) 0.4 MG SL tablet Place 0.4 mg under the tongue every 5 (five) minutes as needed for chest pain.    Yes [provider]  oxybutynin (DITROPAN XL) 15 MG 24 hr tablet Take 15 mg by mouth at bedtime.   Yes [provider]  oxyCODONE (OXY IR/ROXICODONE) 5 MG immediate release tablet Take 5 mg by mouth every 4 (four) hours as needed for pain. 12/08/17  Yes [provider]  polyethylene glycol (MIRALAX / GLYCOLAX) packet Take 17 g by mouth daily. 12/27/17  Yes Masoudi, Elhamalsadat, MD  potassium chloride SA (K-DUR,KLOR-CON) 20 MEQ tablet Take 1 tablet (20 mEq total) by mouth daily. 03/14/17  Yes Park Liter, MD  pramipexole (MIRAPEX) 1 MG tablet Take 1 mg by mouth 2 (two) times daily.   Yes [provider]  promethazine (PHENERGAN) 12.5 MG tablet Take 12.5 mg by mouth every 6 (six) hours as needed for nausea. 12/12/17  Yes [provider]  torsemide (DEMADEX) 20 MG tablet Take 2 tablets (40 mg  total) by mouth 2 (two) times daily. 10/21/17  Yes Alphonzo Grieve, MD  TRELEGY ELLIPTA 100-62.5-25 MCG/INH AEPB Inhale 1 puff into the lungs daily. 08/30/17  Yes [provider]  TRULICITY 1.5 YV/8.5FY SOPN Inject 1.5 mg into the skin every Sunday.  10/10/16  Yes [provider]    Family History Family History  Problem Relation Age of Onset  . Stroke Mother   . Diabetes Father   . Diabetes Sister   . Diabetes Sister   . Diabetes Son     Social History Social History   Tobacco Use  . Smoking status: Current Every Day Smoker  Packs/day: 0.75    Years: 44.00    Pack years: 33.00    Types: Cigarettes  . Smokeless tobacco: Never Used  Substance Use Topics  . Alcohol use: Yes    Alcohol/week: 7.0 standard drinks    Types: 7 Shots of liquor per week  . Drug use: Yes    Types: Marijuana    Comment: 10/23/2017 "had 1 ~ 1 month ago; nothing before or since"     Allergies   Hydroxychloroquine; Donepezil; Prednisone; Anticoagulant cit dext [acd formula a]; Bupropion; Metrizamide; Varenicline; and Tape   Review of Systems Review of Systems  Respiratory: Positive for shortness of breath.    All other systems reviewed and are negative except that which was mentioned in HPI   Physical Exam Updated Vital Signs BP 137/68   Pulse 96   Temp (!) 97.5 F (36.4 C) (Oral)   Resp (!) 26   Wt 104.6 kg   SpO2 100%   BMI 42.20 kg/m   Physical Exam  Constitutional: She is oriented to person, place, and time. She appears well-developed and well-nourished. No distress.  Chronically ill appearing, NAD  HENT:  Head: Normocephalic and atraumatic.  Dry mouth  Eyes: Pupils are equal, round, and reactive to light. Conjunctivae are normal.  Neck: Neck supple.  Cardiovascular: Normal rate, regular rhythm and normal heart sounds.  No murmur heard. Pulmonary/Chest:  Mildly increased WOB with prolonged expiratory phase; severely diminished in b/l bases with occasional  faint wheeze in upper air fields  Abdominal: Soft. Bowel sounds are normal. She exhibits no distension. There is no tenderness.  Musculoskeletal:       Right lower leg: She exhibits edema.       Left lower leg: She exhibits edema.  3+ pitting edema BLE  Neurological: She is alert and oriented to person, place, and time.  Fluent speech  Skin: Skin is warm and dry.  Contact dermatitis L antecubital fossa from previous tape  Psychiatric: She has a normal mood and affect. Judgment normal.  Nursing note and vitals reviewed.    ED Treatments / Results  Labs (all labs ordered are listed, but only abnormal results are displayed) Labs Reviewed  COMPREHENSIVE METABOLIC PANEL - Abnormal; Notable for the following components:      Result Value   Chloride 96 (*)    CO2 34 (*)    Glucose, Bld 301 (*)    Total Protein 5.6 (*)    Albumin 2.2 (*)    AST 67 (*)    ALT 50 (*)    All other components within normal limits  CBC WITH DIFFERENTIAL/PLATELET - Abnormal; Notable for the following components:   RBC 3.45 (*)    Hemoglobin 9.7 (*)    HCT 32.2 (*)    RDW 16.5 (*)    All other components within normal limits  BRAIN NATRIURETIC PEPTIDE - Abnormal; Notable for the following components:   B Natriuretic Peptide 624.1 (*)    All other components within normal limits  URINALYSIS, ROUTINE W REFLEX MICROSCOPIC  I-STAT TROPONIN, ED    EKG EKG Interpretation  Date/Time:  Wednesday December 27 2017 07:05:02 EDT Ventricular Rate:  94 PR Interval:    QRS Duration: 95 QT Interval:  374 QTC Calculation: 468 R Axis:   43 Text Interpretation:  Sinus rhythm Multiple premature complexes, vent & supraven Probable left atrial enlargement Low voltage, precordial leads Borderline T abnormalities, anterior leads similar to previous Confirmed by Theotis Burrow [phone]) on 12/27/2017 7:10:55 AM  Radiology Dg Chest 2 View  Result Date: 12/27/2017 CLINICAL DATA:  Shortness of breath EXAM: CHEST - 2 VIEW  COMPARISON:  December 10, 2017 FINDINGS: There is bibasilar atelectasis with suspected small area of consolidation in the right base. Lungs elsewhere are clear. Heart is upper normal in size with pulmonary vascularity normal. No adenopathy. There is aortic atherosclerosis. There is calcification in the left anterior descending coronary artery. There is postoperative change in the lower cervical region. IMPRESSION: Bibasilar atelectasis with questionable small focus of pneumonia right base. Lungs elsewhere clear. Heart upper normal in size. There is aortic atherosclerosis as well as calcification in the left anterior descending coronary artery. Aortic Atherosclerosis (ICD10-I70.0). Electronically Signed   By: Lowella Grip III M.D.   On: 12/27/2017 08:08    Procedures Procedures (including critical care time)  Medications Ordered in ED Medications  ceFEPIme (MAXIPIME) 1 g in sodium chloride 0.9 % 100 mL IVPB (has no administration in time range)  furosemide (LASIX) injection 40 mg (has no administration in time range)  vancomycin (VANCOCIN) IVPB 1000 mg/200 mL premix (has no administration in time range)  ceFEPIme (MAXIPIME) 1 g in sodium chloride 0.9 % 100 mL IVPB (has no administration in time range)  vancomycin (VANCOCIN) IVPB 750 mg/150 ml premix (has no administration in time range)  ipratropium-albuterol (DUONEB) 0.5-2.5 (3) MG/3ML nebulizer solution 3 mL (3 mLs Nebulization Given 12/27/17 0388)     Initial Impression / Assessment and Plan / ED Course  I have reviewed the triage vital signs and the nursing notes.  Pertinent labs & imaging results that were available during my care of the patient were reviewed by me and considered in my medical decision making (see chart for details).    Non-toxic on exam, stable on home O2 level, 94% on RA. Severe LE edema noted.  Lab work notable for negative troponin, normal creatinine, AST 67, ALT 50, hemoglobin stable at 9.7, normal WBC count, BNP  elevated at 624.  Chest x-ray shows stable early infiltrate in the right lower lung. Gave Vanc/Cefepime for HCAP coverage. Gave IV lasix. While here she has had 2 very brief runs of V tach, not sustained and not requiring any intervention.  Recommended admission for possible pneumonia, volume overload, hyperglycemia and telemetry observation of these arrhythmia episodes.  Discussed with internal medicine teaching service and patient admitted for further care.  Final Clinical Impressions(s) / ED Diagnoses   Final diagnoses:  HCAP (healthcare-associated pneumonia)  Acute on chronic congestive heart failure, unspecified heart failure type [facility])  Hyperglycemia    ED Discharge Orders    None       Carolanne Mercier, Wenda Overland, MD 12/27/17 1041

## 2017-12-27 NOTE — H&P (Signed)
Date: 12/27/2017               Patient Name:  Crystal Yates MRN: [id number]  DOB: 11-11-1955 Age / Sex: 62 y.o., female   PCP: Dortha Kern, PA         Medical Service: Internal Medicine Teaching Service         Attending Physician: Dr. Lucious Groves, DO    First Contact: Dr. Myrtie Hawk Pager: [phone]  Second Contact: Dr. Tarri Abernethy Pager: [phone]       After Hours (After 5p/  First Contact Pager: [phone]  weekends / holidays): Second Contact Pager: [phone]   Chief Complaint: Chest pressure and shortness of breath.  History of Present Illness: Crystal Yates is a 62 y.o.lady with PMHx significant for CHF, CAD, COPD on 2L home O2, CKD, HTN, recent GI bleed who p/w shortness of breath and chest pressure started around 1 AM this morning.  Patient was discharged from hospital yesterday after admission for a lower GI bleed requiring 2 units of packed RBCs.  She was not using her torsemide during hospitalization because of softer blood pressure and her GI bleed, she was instructed to restart her torsemide once and go home.   Patient was feeling fine when went home yesterday afternoon, smoke about 2 cigarettes, ate dinner, had 2 glasses of wine and went to bed.  Around 1 AM she got up to go to urinate, she had an accident which is not unusual for her, after that incidence she went back to her bed but developed shortness of breath and unable to lay down, initially she went to her lounge to sit on in recliner, continue to get worse and shortness of breath and started getting some chest pressure, went out in the porch to get some fresh air with no relief, called her significant other who called the EMS.  She was experiencing symptom control chest pressure, nonradiating, associated with diaphoresis and some palpitation.  She also experience orthopnea last night. She denies any nausea or vomiting.  She has noticed worsening of her lower extremity edema and some leg pain.    Patient denies any recent change in her cough, no nasal congestion, denies any fever or chills.  She denies any urinary symptoms, has a long-standing history of urinary urgency and incontinence, follow-up with urology and according to patient was diagnosed with short overactive bladder syndrome.  ED course.  In ED she was found to be little tachypneic, saturating well, bilateral crackles, 3+ lower extremity edema, negative initial troponin, deviated BNP, mild transaminitis and hemoglobin of 9.7.  Chest x-ray with bilateral basal haziness, little more on right, which was read as right lower  lobe pneumonia and she was started on cefepime and vancomycin for hospital-acquired pneumonia along with 40 mg of Lasix.  Meds:  Current Meds  Medication Sig  . acetaminophen (TYLENOL) 325 MG tablet Take 2 tablets (650 mg total) by mouth every 4 (four) hours as needed for headache or mild pain.  Marland Kitchen albuterol (PROAIR HFA) 108 (90 Base) MCG/ACT inhaler Inhale 2 puffs into the lungs See admin instructions. Inhale 2 puffs into the lungs every 4-6 hours as needed for shortness of breath or wheezing  . aspirin 81 MG chewable tablet Chew 1 tablet (81 mg total) by mouth daily.  Marland Kitchen atorvastatin (LIPITOR) 80 MG tablet Take 1 tablet (80 mg total) by mouth daily at 6 PM.  . CALCIUM PO Take 1 tablet by mouth daily.  . Cholecalciferol (  VITAMIN D3) 2000 units capsule Take 2,000 Units by mouth daily.   . clopidogrel (PLAVIX) 75 MG tablet Take 75 mg by mouth daily.  . DULoxetine (CYMBALTA) 60 MG capsule Take 60 mg by mouth daily.   Marland Kitchen EMGALITY 120 MG/ML SOAJ Inject 120 mg into the skin every 30 (thirty) days.   . fludrocortisone (FLORINEF) 0.1 MG tablet Take 1 tablet (0.1 mg total) by mouth 2 (two) times daily.  Marland Kitchen HYDROcodone-acetaminophen (NORCO/VICODIN) 5-325 MG tablet Take 1 tablet by mouth every 6 (six) hours as needed for pain.  . hydrocortisone (CORTEF) 5 MG tablet Take 5-15 mg by mouth See admin instructions. Take 15  mg by mouth in the morning and 5 mg in the afternoon  . Insulin Aspart, w/Niacinamide, (FIASP FLEXTOUCH) 100 UNIT/ML SOPN Inject 2-14 Units into the skin See admin instructions. Use three times day as needed before meals per sliding scale  . insulin degludec (TRESIBA FLEXTOUCH) 100 UNIT/ML SOPN FlexTouch Pen Inject 40 Units into the skin at bedtime.   Marland Kitchen ipratropium-albuterol (DUONEB) 0.5-2.5 (3) MG/3ML SOLN Take 3 mLs by nebulization 4 (four) times daily.  . Iron-FA-B Cmp-C-Biot-Probiotic (FUSION PLUS) CAPS Take 1 capsule by mouth daily.  Marland Kitchen levothyroxine (SYNTHROID, LEVOTHROID) 150 MCG tablet Take 150 mcg by mouth daily before breakfast.  . Magnesium 400 MG TABS Take 400 mg by mouth daily.   . metoprolol succinate (TOPROL-XL) 25 MG 24 hr tablet Take 0.5 tablets (12.5 mg total) by mouth daily.  . nitroGLYCERIN (NITROSTAT) 0.4 MG SL tablet Place 0.4 mg under the tongue every 5 (five) minutes as needed for chest pain.   Marland Kitchen oxybutynin (DITROPAN XL) 15 MG 24 hr tablet Take 15 mg by mouth at bedtime.  Marland Kitchen oxyCODONE (OXY IR/ROXICODONE) 5 MG immediate release tablet Take 5 mg by mouth every 4 (four) hours as needed for pain.  . polyethylene glycol (MIRALAX / GLYCOLAX) packet Take 17 g by mouth daily.  . potassium chloride SA (K-DUR,KLOR-CON) 20 MEQ tablet Take 1 tablet (20 mEq total) by mouth daily.  . pramipexole (MIRAPEX) 1 MG tablet Take 1 mg by mouth 2 (two) times daily.  . promethazine (PHENERGAN) 12.5 MG tablet Take 12.5 mg by mouth every 6 (six) hours as needed for nausea.  Marland Kitchen torsemide (DEMADEX) 20 MG tablet Take 2 tablets (40 mg total) by mouth 2 (two) times daily.  . TRELEGY ELLIPTA 100-62.5-25 MCG/INH AEPB Inhale 1 puff into the lungs daily.  . TRULICITY 1.5 ZD/6.3OV SOPN Inject 1.5 mg into the skin every Sunday.      Allergies: Allergies as of 12/27/2017 - Review Complete 12/27/2017  Allergen Reaction Noted  . Hydroxychloroquine Shortness Of Breath, Nausea Only, and Other (See Comments)  11/21/2013  . Donepezil Other (See Comments) 10/16/2013  . Prednisone Other (See Comments) 09/02/2013  . Anticoagulant cit dext [acd formula a] Other (See Comments) 11/03/2017  . Bupropion Other (See Comments) 07/15/2016  . Metrizamide Other (See Comments) 10/05/2016  . Varenicline Other (See Comments) 07/15/2016  . Tape Rash and Other (See Comments) 10/12/2016   Past Medical History:  Diagnosis Date  . Acute on chronic systolic heart failure exacerbation(HCC) 04/08/2016  . CAD in native artery    a. Prior LAD stenting based on cath. b. RCA stenting 03/2016 x2.  . Chronic combined systolic and diastolic CHF (congestive heart failure) ([place])   . CKD (chronic kidney disease), stage II   . COPD (chronic obstructive pulmonary disease) ([place])   . Diabetes mellitus without complication ([place])   .  Dyspnea   . Hashimoto's thyroiditis   . Hyperlipidemia   . Hypertension   . Myocardial infarction ([place])   . On home oxygen therapy    "2L; 24/7" (10/23/2017)  . Secondary adrenal insufficiency ([place])   . Thyroid disease   . Tobacco abuse     Family History: Able family members with diabetes on dad side and heart problem on mom side.  Siblings with heart problem and hypertension.  Social History: Smokes 3 over 4 pack/day, 1-2 drinks daily and uses marijuana occasionally, last use was approximately 1 month ago.  Review of Systems: A complete ROS was negative except as per HPI.   Physical Exam: Blood pressure 111/73, pulse 90, temperature (!) 97.5 F (36.4 C), temperature source Oral, resp. rate 19, weight 104.6 kg, SpO2 100 %. Vitals:   12/27/17 1115 12/27/17 1130 12/27/17 1230 12/27/17 1345  BP: 109/81 111/73    Pulse: 92 90 88 98  Resp: (!) 23 19 18  (!) 23  Temp:      TempSrc:      SpO2: 100% 100% 100% 99%  Weight:       General: Vital signs reviewed.  Patient is well-developed and well-nourished, in no acute distress and cooperative with exam.  Head: Normocephalic and  atraumatic. Eyes: EOMI, conjunctivae normal, no scleral icterus.  Neck: Supple, trachea midline, normal ROM, JVD,  Cardiovascular: Regular rate and rhythm,S3?? Pulmonary/Chest: Bilateral basal crackles from mid to lower zone. Abdominal: Soft, non-tender, non-distended, BS +, clean dressing on lower abdomen. Extremities: 3+ lower extremity edema bilaterally up to thighs,  pulses symmetric and intact bilaterally.  Neurological: A&O x3, Strength is normal and symmetric bilaterally, cranial nerve II-XII are grossly intact, no focal motor deficit, sensory intact to light touch bilaterally.  Skin: Warm, dry and intact.  Mild erythema of lower extremities bilaterally. Psychiatric: Normal mood and affect. speech and behavior is normal. Cognition and memory are normal.  EKG: personally reviewed my interpretation is sinus rhythm with multiple PVCs.  CXR: personally reviewed my interpretation is bilateral basal haziness, little more pronounced on right.  Assessment & Plan by Problem: Crystal Yates is a 62 y.o.lady with PMHx significant for CHF, CAD, COPD on 2L home O2, CKD, HTN, recent GI bleed who p/w shortness of breath and chest pressure started around 1 AM this morning.  Active Problems:   Acute on chronic heart failure [facility]) Patient with previous history of HFrEF at 40%, has been improved after cardiac catheterization with PCI.  Last echo done on October 19, 2017. Patient was on torsemide 40 mg twice daily at home which was disrupted for few days due to lower GI bleed and softer blood pressure. According to patient she has gained approximately 10 pounds over the last few days. Her complaints and exam was more consistent with acute on chronic heart failure and being volume overload. She was given Lasix 40 mg in ED. -Continue diuresis with Lasix 60 mg IV twice daily. -Strict intake and output. -Daily weights. -Continue beta-blocker.  CAD w/stable angina s/p stent placement.  Current EKG  without any acute change in ST segment.  Initial troponin negative. -We will trend troponin because of her complaint of chest pressure. -Restart aspirin and Plavix as scheduled before. -Continue beta-blocker. -Continue statin.  Right lower lobe infiltrate??.  Not convincing for any pneumonia. Patient is afebrile, no change in her cough and no leukocytosis. -Discontinue cefepime and vancomycin. -Keep monitoring.  Diabetes.  Continuing home dose of Tresiba with SSI.  Adrenal insufficiency.  Continuing home dose of fludrocortisone and hydrocortisone.  COPD.  Continuing home meds which include DuoNeb, Trelegy and albuterol PRN.  Hypothyroidism.  Continuing home dose of levothyroxine.   Dispo: Admit patient to Inpatient with expected length of stay greater than 2 midnights.  SignedLorella Nimrod, MD 12/27/2017, 12:59 PM  Pager: [phone]

## 2017-12-27 NOTE — Progress Notes (Signed)
Pharmacy Antibiotic Note  Crystal Yates is a 62 y.o. female admitted on 12/27/2017 with pneumonia.    Plan: Cefepime 1 g q8h Vanc 1 g x 1 then 750 mg q12h Monitor rena fx cx vt prn  Weight: 230 lb 11.2 oz (104.6 kg)  Temp (24hrs), Avg:97.8 F (36.6 C), Min:97.5 F (36.4 C), Max:98 F (36.7 C)  Recent Labs  Lab 12/24/17 0451 12/24/17 0818 12/25/17 0547 12/26/17 0824 12/27/17 0806  WBC 7.6  --   --   --  8.9  CREATININE 1.05*  --  0.81 0.81 0.96  LATICACIDVEN  --  1.26  --   --   --     Estimated Creatinine Clearance: 69 mL/min (by C-G formula based on SCr of 0.96 mg/dL).    Allergies  Allergen Reactions  . Hydroxychloroquine Shortness Of Breath, Nausea Only and Other (See Comments)    Dizziness (also)  . Donepezil Other (See Comments)    Dizziness, depression, and makes the patient feel "funny"  . Prednisone Other (See Comments)    Causes depression and suicidal thoughts  . Anticoagulant Cit Dext [Acd Formula A] Other (See Comments)    Unknown  . Bupropion Other (See Comments)    Suicidal thoughts  . Metrizamide Other (See Comments)    (a non-ionic radiopaque contrast agent) "Blows the vein" and contrast gathers at the injected site's limb  . Varenicline Other (See Comments)    Suicidal thoughts  . Tape Rash and Other (See Comments)    Paper tape is preferred, PLEASE   Levester Fresh, PharmD, BCPS, BCCCP Clinical Pharmacist [phone]  Please check AMION for all [place] numbers  12/27/2017 10:37 AM

## 2017-12-28 DIAGNOSIS — E876 Hypokalemia: Secondary | ICD-10-CM | POA: Diagnosis not present

## 2017-12-28 DIAGNOSIS — I509 Heart failure, unspecified: Secondary | ICD-10-CM

## 2017-12-28 DIAGNOSIS — R609 Edema, unspecified: Secondary | ICD-10-CM

## 2017-12-28 LAB — BASIC METABOLIC PANEL
ANION GAP: 7 (ref 5–15)
BUN: 11 mg/dL (ref 8–23)
CALCIUM: 8.4 mg/dL — AB (ref 8.9–10.3)
CO2: 33 mmol/L — ABNORMAL HIGH (ref 22–32)
CREATININE: 0.94 mg/dL (ref 0.44–1.00)
Chloride: 99 mmol/L (ref 98–111)
Glucose, Bld: 128 mg/dL — ABNORMAL HIGH (ref 70–99)
Potassium: 3.2 mmol/L — ABNORMAL LOW (ref 3.5–5.1)
Sodium: 139 mmol/L (ref 135–145)

## 2017-12-28 LAB — CBC
HCT: 30.7 % — ABNORMAL LOW (ref 36.0–46.0)
HEMOGLOBIN: 9.6 g/dL — AB (ref 12.0–15.0)
MCH: 28.7 pg (ref 26.0–34.0)
MCHC: 31.3 g/dL (ref 30.0–36.0)
MCV: 91.9 fL (ref 78.0–100.0)
PLATELETS: 250 10*3/uL (ref 150–400)
RBC: 3.34 MIL/uL — AB (ref 3.87–5.11)
RDW: 16.5 % — ABNORMAL HIGH (ref 11.5–15.5)
WBC: 10.2 10*3/uL (ref 4.0–10.5)

## 2017-12-28 LAB — MAGNESIUM
MAGNESIUM: 1.5 mg/dL — AB (ref 1.7–2.4)
Magnesium: 2.6 mg/dL — ABNORMAL HIGH (ref 1.7–2.4)

## 2017-12-28 LAB — GLUCOSE, CAPILLARY
GLUCOSE-CAPILLARY: 134 mg/dL — AB (ref 70–99)
GLUCOSE-CAPILLARY: 152 mg/dL — AB (ref 70–99)
Glucose-Capillary: 73 mg/dL (ref 70–99)
Glucose-Capillary: 337 mg/dL — ABNORMAL HIGH (ref 70–99)
Glucose-Capillary: 310 mg/dL — ABNORMAL HIGH (ref 70–99)

## 2017-12-28 LAB — TROPONIN I: TROPONIN I: 0.03 ng/mL — AB (ref <0.03)

## 2017-12-28 MED ORDER — GALCANEZUMAB-GNLM 120 MG/ML ~~LOC~~ SOAJ
120.0000 mg | SUBCUTANEOUS | Status: AC
  Administered 2017-12-28: 120 mg via SUBCUTANEOUS
  Filled 2017-12-28 (×3): qty 1

## 2017-12-28 MED ORDER — FUROSEMIDE 10 MG/ML IJ SOLN
60.0000 mg | Freq: Two times a day (BID) | INTRAMUSCULAR | Status: DC
  Administered 2017-12-28 – 2017-12-30 (×5): 60 mg via INTRAVENOUS
  Filled 2017-12-28 (×5): qty 6

## 2017-12-28 MED ORDER — MAGNESIUM SULFATE 4 GM/100ML IV SOLN
4.0000 g | Freq: Once | INTRAVENOUS | Status: AC
  Administered 2017-12-28: 4 g via INTRAVENOUS
  Filled 2017-12-28: qty 100

## 2017-12-28 MED ORDER — OXYBUTYNIN CHLORIDE ER 15 MG PO TB24
15.0000 mg | ORAL_TABLET | Freq: Every day | ORAL | Status: DC
  Administered 2017-12-28 – 2018-01-02 (×6): 15 mg via ORAL
  Filled 2017-12-28 (×6): qty 1

## 2017-12-28 MED ORDER — POTASSIUM CHLORIDE CRYS ER 20 MEQ PO TBCR
40.0000 meq | EXTENDED_RELEASE_TABLET | Freq: Two times a day (BID) | ORAL | Status: AC
  Administered 2017-12-28 (×2): 40 meq via ORAL
  Filled 2017-12-28 (×2): qty 2

## 2017-12-28 MED ORDER — OXYCODONE HCL 5 MG PO TABS
5.0000 mg | ORAL_TABLET | ORAL | Status: DC | PRN
  Administered 2017-12-28 – 2018-01-02 (×6): 5 mg via ORAL
  Filled 2017-12-28 (×6): qty 1

## 2017-12-28 MED ORDER — COLLAGENASE 250 UNIT/GM EX OINT
TOPICAL_OINTMENT | Freq: Every day | CUTANEOUS | Status: DC
  Administered 2017-12-28: 1 via TOPICAL
  Administered 2017-12-29 – 2018-01-02 (×5): via TOPICAL
  Administered 2018-01-03: 1 via TOPICAL
  Filled 2017-12-28: qty 30

## 2017-12-28 NOTE — Progress Notes (Signed)
   Subjective: Patient was seen and evaluated at bedside on morning rounds. No acute events overnight. She mentions that she has urinated well with lasix. She feels better and shortness of breath improved. No chest pain since admission. Still has swelling on her legs. Also complains of some swelling on her upper extremities mostly at right hand that started today. She says that it is not painful. She asks if she can inject her out ot hospital monthly migraine.  Objective:  Vital signs in last 24 hours: Vitals:   12/28/17 0455 12/28/17 0859 12/28/17 1309 12/28/17 1700  BP: 132/72 117/63 124/74 (!) 115/57  Pulse: 89 96 96 97  Resp: 18  18   Temp: (!) 97.5 F (36.4 C)     TempSrc: Oral     SpO2: 100% 100% 100%   Weight:      Height:       Physical Exam  Constitutional: She appears well-developed and well-nourished. No distress. Is on Nasal O2. HENT:  Head: Normocephalic and atraumatic.  Eyes: Conjunctivae are normal.  Cardiovascular: Normal rate, regular rhythm and normal heart sounds.  Respiratory: Effort normal and breath sounds normal. No respiratory distress. She has bibasilar crackle up to 1/3 of lungs.  GI: Soft.There is no tenderness.  Musculoskeletal: She exhibits 3+ bilateral pitting edema up to knees. Mild to moderate non pitting edema at both arms and hand mostly at right hand.   Neurological: She is alert.  Skin: She is not diaphoretic. No erythema.  Psychiatric: She has a normal mood and affect. Her behavior is normal. Judgment and thought content normal.   Assessment/Plan:  Active Problems:   Acute on chronic heart failure (HCC) Acute on chronic CHF: Patient with significant symptomatic volume overload, pulm edema and increased BNP. Trop -.  In setting of recent holding diuretics due to hospitalization for GI bleeding.  Diuresed her with 60 mg IV lasix Hypokalemia and hypomagnesemia repleted  -Continue IV lasix 60 mg BID -Monitor BMP -Strict I/O -monitor  weight  Upper extremity non pitting edema Mostly at right side. Likely 2/2 CHF related volume overload.  No redness, warmth or tenderness.  Will observe tomorrow  Adrenal insufficiency: -Continue Hydrocortisone 15 mg in AM and 5 mg in PM -Continue Fludricortisine 0.1 mg QD   Dispo: Anticipated discharge in approximately 4-5 days after clinical improvement of volume overload  Dewayne Hatch, MD 12/28/2017, 7:34 PM Pager:[phone]

## 2017-12-28 NOTE — Consult Note (Signed)
[facility] Nurse wound consult note Reason for Consult:abdominal wound from previous surgery Patient reports multiple abdominal surgeries, most recently 2 weeks ago for bowel obstructions and lysis of adhesions. She has abdominal mesh from previous surgeries. She self reports an evisceration from abdominal wounds in 2016.    Wound type: non healing surgical sites from abdominal surgery at [facility] Pressure Injury POA: NA Measurement: proximal: 2cm x 2cm x 0.3cm; distal 2cm x 2cm x 0.2cm  Wound bed: proximal 100% yellow stringy non viable tissue with prolene suture knot visible; distal 20% slough with 90% pale tissue Drainage (amount, consistency, odor) brown/yellow, however no fecal odor noted Periwound: intact, healed central incision, with firmness around area that extends 4-5 cm, but not red. Epibole of the wound edges on both wounds Dressing procedure/placement/frequency: Add enzymatic debrider with saline moist gauze. Top with dry dressing. Change daily.    Patient to follow up with surgeon after DC as scheduled.     Discussed POC with patient and bedside nurse.  Re consult if needed, will not follow at this time. Thanks  Nahsir Venezia [employer], RN,CWOCN, CNS, [place] [phone])

## 2017-12-29 DIAGNOSIS — E2749 Other adrenocortical insufficiency: Secondary | ICD-10-CM

## 2017-12-29 DIAGNOSIS — R609 Edema, unspecified: Secondary | ICD-10-CM | POA: Diagnosis not present

## 2017-12-29 DIAGNOSIS — Z7982 Long term (current) use of aspirin: Secondary | ICD-10-CM

## 2017-12-29 DIAGNOSIS — Z7902 Long term (current) use of antithrombotics/antiplatelets: Secondary | ICD-10-CM

## 2017-12-29 DIAGNOSIS — I110 Hypertensive heart disease with heart failure: Secondary | ICD-10-CM | POA: Diagnosis not present

## 2017-12-29 DIAGNOSIS — E119 Type 2 diabetes mellitus without complications: Secondary | ICD-10-CM

## 2017-12-29 DIAGNOSIS — I509 Heart failure, unspecified: Secondary | ICD-10-CM | POA: Diagnosis not present

## 2017-12-29 DIAGNOSIS — I2510 Atherosclerotic heart disease of native coronary artery without angina pectoris: Secondary | ICD-10-CM

## 2017-12-29 LAB — CBC
HCT: 33.4 % — ABNORMAL LOW (ref 36.0–46.0)
Hemoglobin: 10.3 g/dL — ABNORMAL LOW (ref 12.0–15.0)
MCH: 28.9 pg (ref 26.0–34.0)
MCHC: 30.8 g/dL (ref 30.0–36.0)
MCV: 93.8 fL (ref 78.0–100.0)
Platelets: 263 10*3/uL (ref 150–400)
RBC: 3.56 MIL/uL — ABNORMAL LOW (ref 3.87–5.11)
RDW: 16.1 % — ABNORMAL HIGH (ref 11.5–15.5)
WBC: 8 10*3/uL (ref 4.0–10.5)

## 2017-12-29 LAB — BASIC METABOLIC PANEL
Anion gap: 10 (ref 5–15)
BUN: 16 mg/dL (ref 8–23)
CALCIUM: 8.9 mg/dL (ref 8.9–10.3)
CO2: 36 mmol/L — ABNORMAL HIGH (ref 22–32)
CREATININE: 1.04 mg/dL — AB (ref 0.44–1.00)
Chloride: 96 mmol/L — ABNORMAL LOW (ref 98–111)
GFR calc Af Amer: >60 mL/min (ref >60)
GFR calc non Af Amer: 56 mL/min — ABNORMAL LOW (ref >60)
Glucose, Bld: 181 mg/dL — ABNORMAL HIGH (ref 70–99)
Potassium: 4.3 mmol/L (ref 3.5–5.1)
Sodium: 142 mmol/L (ref 135–145)

## 2017-12-29 LAB — GLUCOSE, CAPILLARY
GLUCOSE-CAPILLARY: 267 mg/dL — AB (ref 70–99)
GLUCOSE-CAPILLARY: 163 mg/dL — AB (ref 70–99)
Glucose-Capillary: 183 mg/dL — ABNORMAL HIGH (ref 70–99)
Glucose-Capillary: 145 mg/dL — ABNORMAL HIGH (ref 70–99)

## 2017-12-29 LAB — MAGNESIUM: Magnesium: 2 mg/dL (ref 1.7–2.4)

## 2017-12-29 MED ORDER — IPRATROPIUM-ALBUTEROL 0.5-2.5 (3) MG/3ML IN SOLN
3.0000 mL | Freq: Three times a day (TID) | RESPIRATORY_TRACT | Status: DC
  Administered 2017-12-29 – 2018-01-03 (×14): 3 mL via RESPIRATORY_TRACT
  Filled 2017-12-29 (×15): qty 3

## 2017-12-29 NOTE — Progress Notes (Signed)
Cardiac monitoring continues.   Arjun Hard, RN

## 2017-12-29 NOTE — Progress Notes (Addendum)
Per CCMD patient brady down to 42 and went back to normal immediately. Patient asymptomatic. Dr.Masoudi notified. No new orders at this time.   Avion Patella, RN

## 2017-12-29 NOTE — Progress Notes (Signed)
Cardiac monitoring order expired, paged MD and informed.   Ruari Mudgett, RN

## 2017-12-29 NOTE — Progress Notes (Signed)
Per CCMD 5 beats of VT. VS stable, pt asymptomatic, resting in the bed. Paged Resident on call. No new orders. Keep monitoring.  Mekaela Azizi, RN

## 2017-12-29 NOTE — Progress Notes (Addendum)
Inpatient Diabetes Program Recommendations  AACE/ADA: New Consensus Statement on Inpatient Glycemic Control (2019)  Target Ranges:  Prepandial:   less than 140 mg/dL      Peak postprandial:   less than 180 mg/dL (1-2 hours)      Critically ill patients:  140 - 180 mg/dL   Results for RANEEM, MENDOLIA (MRN [id number]) as of 12/29/2017 13:25  Ref. Range 12/28/2017 07:42 12/28/2017 11:24 12/28/2017 12:42 12/28/2017 16:30 12/28/2017 21:29 12/29/2017 07:53 12/29/2017 11:33  Glucose-Capillary Latest Ref Range: 70 - 99 mg/dL 73 134 (H) 152 (H) 337 (H) 310 (H) 183 (H) 267 (H)   Review of Glycemic Control  Diabetes history: DM2 Outpatient Diabetes medications: Tresiba 40 units QHS, Fiasp 2-14 units TID with meals, Trulicity 1.5 mg Qweek (Sunday) Current orders for Inpatient glycemic control: Lantus 40 units QHS, Novolog 0-15 units TID with meals, Novolog 0-5 units QHS; Cortef 15 mg QAM, Cortef 5 mg daily  Inpatient Diabetes Program Recommendations: Insulin - Meal Coverage: If Cortef is continued as ordered and post prandial glucose continues to be consistently greater than 180 mg/dl, please consider ordering Novolog 3 units TID with meals for meal coverage if patient eats at least 50% of meals.  Thanks, Barnie Alderman, RN, MSN, CDE Diabetes Coordinator Inpatient Diabetes Program [phone] (Team Pager from 8am to 5pm)

## 2017-12-29 NOTE — Progress Notes (Signed)
   Subjective: Patient was seen and evaluated at bedside today. She is on nasal O2 and does not report shortness of breath. She denies having more episode of panic attack. No other acute complaints.  Objective:  Vital signs in last 24 hours: Vitals:   12/29/17 0851 12/29/17 1218 12/29/17 1231 12/29/17 1339  BP: 129/68 122/70    Pulse: 94 83    Resp: 18 18    Temp:  97.7 F (36.5 C)    TempSrc:  Oral    SpO2: 94% 100% 100% 98%  Weight:      Height:       Physical Exam  Constitutional: Obese, well-developed and well-nourished, in no acute distress, on the liters nasal O2 Head: Normocephalic and atraumatic CV: RRR, no murmur Lungs, effort is normal, has bilateral crackles up to one third of lungs and some wheeze. Abdomen: Soft and nontender Extremities: 3+ bilateral pitting edema up to knees Neurological: Alert and oriented x3 Psychiatric: Mood and affect are normal, behavior is normal, judgment and thought content are normal  Assessment/Plan:  Principal Problem:   Acute on chronic heart failure (HCC) Active Problems:   Morbid obesity ([place])  62 year old female with past medical history of CHF, hypertension, CAD, DM, COPD on nasal oxygen at home, secondary adrenal insufficiency, hypothyroidism, recent admission due to GI bleeding Acute on chronic CHF: Patient is still volume overloaded on exam, having bilateral crackle and 3+ pitting edema up to bilateral knees. I/O: net -1800.  Has lost 1 pound since admission.  Potassium and magnesium normal today. (K: 0.3, MG: 2), BUN 16 (was 11 yesterday), creatinine 1.04 (was 0.09 yesterday)  -Continue IV lasix 60 mg BID -Monitor BMP -Strict I/O -monitor weight  Upper extremity non pitting edema Improved today. Will monitor tomorrow  Adrenal insufficiency: -Continue Hydrocortisone 15 mg in AM and 5 mg in PM -Continue Fludricortisine 0.1 mg QD Hypothyroidism: -Continue levothyroxine  DM: Capillary glucose this a.m. 267-> p.m.:  145 -Continue moderate SSI and Lantus 40 units at bedtime -Monitor GBS  CAD: No chest pain -Continue aspirin and Plavix  HTN: Normotensive with treatment Continue metoprolol 12.5 daily.  Also on Lasix  COPD: -Continue nasal oxygen 2 L and DuoNeb and Proventil   Morbid obesity: Stable. Will take cardiac and respiratory benefit of loosing weight   Diet: Heart healthy/carb modified IV fluid: None VTE ppx: SCD Code: Full   Dispo: Anticipated discharge in 3 to 4 days after clinical improvement of volume overload  Dewayne Hatch, MD 12/29/2017, 2:58 PM Pager: [phone]

## 2017-12-29 NOTE — Progress Notes (Addendum)
Patient with 9 beats of wide QRS's. Pt asymptomatic. MD at bedside, rounding on the patient,MD notified. Will review telemetry.    Anyelo Mccue, RN

## 2017-12-30 DIAGNOSIS — N189 Chronic kidney disease, unspecified: Secondary | ICD-10-CM

## 2017-12-30 DIAGNOSIS — Z9861 Coronary angioplasty status: Secondary | ICD-10-CM

## 2017-12-30 DIAGNOSIS — I5023 Acute on chronic systolic (congestive) heart failure: Secondary | ICD-10-CM | POA: Diagnosis not present

## 2017-12-30 DIAGNOSIS — E1122 Type 2 diabetes mellitus with diabetic chronic kidney disease: Secondary | ICD-10-CM | POA: Diagnosis not present

## 2017-12-30 DIAGNOSIS — E872 Acidosis: Secondary | ICD-10-CM

## 2017-12-30 DIAGNOSIS — I130 Hypertensive heart and chronic kidney disease with heart failure and stage 1 through stage 4 chronic kidney disease, or unspecified chronic kidney disease: Secondary | ICD-10-CM | POA: Diagnosis not present

## 2017-12-30 DIAGNOSIS — Z7951 Long term (current) use of inhaled steroids: Secondary | ICD-10-CM

## 2017-12-30 LAB — CBC
HCT: 32.4 % — ABNORMAL LOW (ref 36.0–46.0)
Hemoglobin: 9.9 g/dL — ABNORMAL LOW (ref 12.0–15.0)
MCH: 28.8 pg (ref 26.0–34.0)
MCHC: 30.6 g/dL (ref 30.0–36.0)
MCV: 94.2 fL (ref 78.0–100.0)
PLATELETS: 278 10*3/uL (ref 150–400)
RBC: 3.44 MIL/uL — AB (ref 3.87–5.11)
RDW: 15.9 % — ABNORMAL HIGH (ref 11.5–15.5)
WBC: 8.9 10*3/uL (ref 4.0–10.5)

## 2017-12-30 LAB — BASIC METABOLIC PANEL
ANION GAP: 9 (ref 5–15)
BUN: 17 mg/dL (ref 8–23)
CALCIUM: 8.8 mg/dL — AB (ref 8.9–10.3)
CO2: 35 mmol/L — AB (ref 22–32)
Chloride: 97 mmol/L — ABNORMAL LOW (ref 98–111)
Creatinine, Ser: 1.02 mg/dL — ABNORMAL HIGH (ref 0.44–1.00)
GFR, EST NON AFRICAN AMERICAN: 58 mL/min — AB (ref >60)
Glucose, Bld: 126 mg/dL — ABNORMAL HIGH (ref 70–99)
Potassium: 3.6 mmol/L (ref 3.5–5.1)
SODIUM: 141 mmol/L (ref 135–145)

## 2017-12-30 LAB — MAGNESIUM: MAGNESIUM: 1.8 mg/dL (ref 1.7–2.4)

## 2017-12-30 LAB — GLUCOSE, CAPILLARY
GLUCOSE-CAPILLARY: 124 mg/dL — AB (ref 70–99)
GLUCOSE-CAPILLARY: 115 mg/dL — AB (ref 70–99)
GLUCOSE-CAPILLARY: 218 mg/dL — AB (ref 70–99)
GLUCOSE-CAPILLARY: 147 mg/dL — AB (ref 70–99)
Glucose-Capillary: 263 mg/dL — ABNORMAL HIGH (ref 70–99)

## 2017-12-30 MED ORDER — RAMELTEON 8 MG PO TABS
8.0000 mg | ORAL_TABLET | Freq: Every day | ORAL | Status: DC
  Administered 2017-12-30 – 2018-01-02 (×4): 8 mg via ORAL
  Filled 2017-12-30 (×4): qty 1

## 2017-12-30 MED ORDER — MAGNESIUM SULFATE 2 GM/50ML IV SOLN
2.0000 g | Freq: Once | INTRAVENOUS | Status: AC
  Administered 2017-12-30: 2 g via INTRAVENOUS
  Filled 2017-12-30: qty 50

## 2017-12-30 MED ORDER — FUROSEMIDE 10 MG/ML IJ SOLN
60.0000 mg | Freq: Two times a day (BID) | INTRAMUSCULAR | Status: DC
  Administered 2017-12-31 – 2018-01-01 (×3): 60 mg via INTRAVENOUS
  Filled 2017-12-30 (×3): qty 6

## 2017-12-30 MED ORDER — POTASSIUM CHLORIDE CRYS ER 20 MEQ PO TBCR
40.0000 meq | EXTENDED_RELEASE_TABLET | Freq: Once | ORAL | Status: AC
  Administered 2017-12-30: 40 meq via ORAL
  Filled 2017-12-30: qty 2

## 2017-12-30 NOTE — Progress Notes (Signed)
Orthopedic Tech Progress Note Patient Details:  Crystal Yates 1955/06/29 [id number]  Ortho Devices Type of Ortho Device: Ace wrap, Unna boot Ortho Device/Splint Location: Bilateral unna boots Ortho Device/Splint Interventions: Application   Post Interventions Patient Tolerated: Well Instructions Provided: Care of device   Crystal Yates 12/30/2017, 7:04 PM

## 2017-12-30 NOTE — Progress Notes (Signed)
   Subjective: Patient is doing well today. She is slowing breathing better and able to recline further each night. She would like something to help her sleep and have her lasix dose timing changed to that she is not going to the bathroom all night. She feels her swelling is slowly improving but is minimal. We discussed the plan for continued IV diureses and she would likely be here for at least another 2-3 days.   Objective: Vital signs in last 24 hours: Vitals:   12/29/17 1913 12/29/17 1950 12/30/17 0245 12/30/17 0313  BP: (!) 108/59   121/68  Pulse: 80   89  Resp: 16     Temp: (!) 97.5 F (36.4 C)   98 F (36.7 C)  TempSrc: Oral   Oral  SpO2: 98% 98%  95%  Weight:   105.8 kg   Height:       General: Obese female in no acute distress Pulm: Good air movement with no wheezing or crackles  CV: RRR, no murmurs, no rubs  Abdomen: Soft, non-distended, no tenderness to palpation  Extremities:  Severe pitting LE edema tracking to the mid-thighs (improving) Skin: Warm and dry  Neuro: Alert and oriented x 3  Assessment/Plan:  Crystal Yates is a 62 year old female with HFrEF that recovered with subsequent PCI and medical management, CAD, COPD on 2 L home oxygen, CKD, and hypertension who presented with signs and symptoms of volume overload on 10/2. She was subsequently admitted for an acute on chronic heart failure exacerbation. Further management as outlined below.  HFrEF, Acute exacerbation. - Patient still very volume overload and having some orthopnea  - Telemetry reviewed. NSR with occasional PVC - Net negative 1.9L over the past 24 hours, but weight remains at 105 kg (Dry weight 96.6 kg) - Labs this morning with a potassium of 3.6 and a magnesium of 1.8. She also has a chronic metabolic alkalosis that is stable. Renal function is stable - Continue IV furosemide 60 mg BID - Continue metoprolol succinate 12.5 mg daily. The patient was previously on Entresto; however, this was the  seed during a prior hospitalization due to hypertension. As we get closer to discharge we will add on an ACE or ARB.  - Unna boots ordered   CAD - Continue aspirin, Plavix, and metoprolol - As outlined above we will add on an ACE or ARB as we get closer to discharge  Adrenal insufficiency - Continue Hydrocortisone 15 mg in the a.m., and 5 mg in the p.m. - Continue Fludricortisone 0.1 mg daily  DM  - Patient's CBGs are very labile due to steroid use - Fasting glucose at goal of <180  - Continue Glargine 40 units QD and SSI with meals   HTN  - Patient is currently normotensive  - Continue Metoprolol   COPD on chronic oxygen therapy  - Continue home oxygen of 2L/min  - Continue albuterol, Dounebs, Breo, and Incruse   Dispo: Patient remains severely volume overload on exam requiring IV diuretics. She will likely require 2 to 3 more days of IV diuretics before being stable enough for discharge. Risks of discharge early include respiratory failure and worsening of her heart failure. We will continue to assess daily.  Ina Homes, MD 12/30/2017, 5:28 AM

## 2017-12-31 DIAGNOSIS — I5023 Acute on chronic systolic (congestive) heart failure: Secondary | ICD-10-CM | POA: Diagnosis not present

## 2017-12-31 DIAGNOSIS — E1122 Type 2 diabetes mellitus with diabetic chronic kidney disease: Secondary | ICD-10-CM | POA: Diagnosis not present

## 2017-12-31 DIAGNOSIS — N189 Chronic kidney disease, unspecified: Secondary | ICD-10-CM | POA: Diagnosis not present

## 2017-12-31 DIAGNOSIS — I130 Hypertensive heart and chronic kidney disease with heart failure and stage 1 through stage 4 chronic kidney disease, or unspecified chronic kidney disease: Secondary | ICD-10-CM | POA: Diagnosis not present

## 2017-12-31 LAB — BASIC METABOLIC PANEL
ANION GAP: 7 (ref 5–15)
BUN: 15 mg/dL (ref 8–23)
CO2: 35 mmol/L — ABNORMAL HIGH (ref 22–32)
CREATININE: 0.95 mg/dL (ref 0.44–1.00)
Calcium: 9 mg/dL (ref 8.9–10.3)
Chloride: 97 mmol/L — ABNORMAL LOW (ref 98–111)
GFR calc Af Amer: >60 mL/min (ref >60)
GFR calc non Af Amer: >60 mL/min (ref >60)
GLUCOSE: 78 mg/dL (ref 70–99)
Potassium: 3.5 mmol/L (ref 3.5–5.1)
Sodium: 139 mmol/L (ref 135–145)

## 2017-12-31 LAB — CBC
HEMATOCRIT: 33.3 % — AB (ref 36.0–46.0)
HEMOGLOBIN: 10.2 g/dL — AB (ref 12.0–15.0)
MCH: 28.8 pg (ref 26.0–34.0)
MCHC: 30.6 g/dL (ref 30.0–36.0)
MCV: 94.1 fL (ref 78.0–100.0)
PLATELETS: 318 10*3/uL (ref 150–400)
RBC: 3.54 MIL/uL — AB (ref 3.87–5.11)
RDW: 15.8 % — ABNORMAL HIGH (ref 11.5–15.5)
WBC: 8.9 10*3/uL (ref 4.0–10.5)

## 2017-12-31 LAB — MAGNESIUM: Magnesium: 2 mg/dL (ref 1.7–2.4)

## 2017-12-31 LAB — GLUCOSE, CAPILLARY
GLUCOSE-CAPILLARY: 246 mg/dL — AB (ref 70–99)
Glucose-Capillary: 110 mg/dL — ABNORMAL HIGH (ref 70–99)
Glucose-Capillary: 138 mg/dL — ABNORMAL HIGH (ref 70–99)
Glucose-Capillary: 227 mg/dL — ABNORMAL HIGH (ref 70–99)

## 2017-12-31 MED ORDER — POTASSIUM CHLORIDE CRYS ER 20 MEQ PO TBCR
40.0000 meq | EXTENDED_RELEASE_TABLET | Freq: Two times a day (BID) | ORAL | Status: AC
  Administered 2017-12-31 (×2): 40 meq via ORAL
  Filled 2017-12-31 (×2): qty 2

## 2017-12-31 NOTE — Progress Notes (Signed)
Pt had 6 beats of vtach per CCMD  Informed MD  Pt non symptomatic

## 2017-12-31 NOTE — Progress Notes (Signed)
   Subjective: Patient doing well this morning, but continues to have difficulty with sleep. It sounds as though she has difficulty maintaining sleep. She still endorses orthopnea states that she does not feel will ever get better. She is unsure why her weight went up 4 kg overnight states that she is continuing to have good urine output with the diuretics. We discussed the plan to continue IV diuretics today and she will likely need an additional 2 to 3 days of IV diuretics before she will be ready for discharge. She is reluctant but understands. All questions and concerns addressed.  Objective: Vital signs in last 24 hours: Vitals:   12/30/17 0905 12/30/17 1146 12/30/17 1943 12/31/17 0500  BP:  113/70 128/73   Pulse:  68 78   Resp:  18 20   Temp:  98.3 F (36.8 C) 97.9 F (36.6 C)   TempSrc:  Oral Oral   SpO2: 98% 100% 100%   Weight:    109.4 kg  Height:       General: Obese female in no acute distress Pulm: Good air movement, rales bilaterally  CV: RRR, no murmurs, no rubs  Abdomen: Soft, non-distended, no tenderness to palpation  Extremities: Severe pitting LE edema track up to the knees bilaterally, improving. Unna boots in place  Assessment/Plan:  Crystal Yates is a 62 year old female with HFrEF that recovered with subsequent PCI and medical management, CAD, COPD on 2 L home oxygen, CKD, and hypertension who presented with signs and symptoms of volume overload on 10/2. She was subsequently admitted for an acute on chronic heart failure exacerbation. Further management as outlined below.  HFrEF, Acute exacerbation. - Patient still very volume overload and having some orthopnea  - UA reviewed that does not illustrate any proteinuria, albumin is slightly low at 2.2 which could explain some of the LE edema  - Bilateral unna boots placed yesterday  - Telemetry reviewed. NSR - Urine output only recorded at 1.1L and weight apparently increased 4 kg to 109.4 kg (Dry weight 96.6 kg).  I doubt these are accurate. Will continue to monitor fluid status.  - Potassium 3.5, Mg 2.0, and Renal function stable - Continue IV furosemide 60 mg BID - Continue metoprolol succinate 12.5 mg daily.  - Discharge on an ACE or ARB.   CAD - Continue aspirin, Plavix, and metoprolol - Discharge on an ACE or ARB.   Adrenal insufficiency - Continue Hydrocortisone 15 mg in the a.m., and 5 mg in the p.m. - Continue Fludricortisone 0.1 mg daily  DM  - Patient's CBGs are very labile due to steroid use - Fasting glucose at goal of <180  - Continue Glargine 40 units QD and SSI with meals, may need to switch to resistant SSI if mealtime CBGs remain elevated.   HTN  - Patient is currently normotensive  - Continue Metoprolol   COPD on chronic oxygen therapy  - Continue home oxygen of 2L/min  - Continue albuterol, Dounebs, Breo, and Incruse   Dispo: Patient remains severely volume overload on exam requiring IV diuretics. She will likely require 2 to 3 more days of IV diuretics before being stable enough for discharge. Risks of discharge early include respiratory failure and worsening of her heart failure. We will continue to assess daily.  Ina Homes, MD 12/31/2017, 6:23 AM

## 2018-01-01 ENCOUNTER — Inpatient Hospital Stay (HOSPITAL_COMMUNITY): Payer: Federal, State, Local not specified - PPO

## 2018-01-01 DIAGNOSIS — E11649 Type 2 diabetes mellitus with hypoglycemia without coma: Secondary | ICD-10-CM

## 2018-01-01 DIAGNOSIS — G4700 Insomnia, unspecified: Secondary | ICD-10-CM

## 2018-01-01 DIAGNOSIS — I2510 Atherosclerotic heart disease of native coronary artery without angina pectoris: Secondary | ICD-10-CM | POA: Diagnosis not present

## 2018-01-01 DIAGNOSIS — F410 Panic disorder [episodic paroxysmal anxiety] without agoraphobia: Secondary | ICD-10-CM

## 2018-01-01 DIAGNOSIS — E2740 Unspecified adrenocortical insufficiency: Secondary | ICD-10-CM | POA: Diagnosis not present

## 2018-01-01 DIAGNOSIS — R05 Cough: Secondary | ICD-10-CM | POA: Diagnosis not present

## 2018-01-01 DIAGNOSIS — I110 Hypertensive heart disease with heart failure: Secondary | ICD-10-CM | POA: Diagnosis not present

## 2018-01-01 DIAGNOSIS — I5023 Acute on chronic systolic (congestive) heart failure: Secondary | ICD-10-CM | POA: Diagnosis not present

## 2018-01-01 DIAGNOSIS — R0602 Shortness of breath: Secondary | ICD-10-CM | POA: Diagnosis not present

## 2018-01-01 DIAGNOSIS — R0989 Other specified symptoms and signs involving the circulatory and respiratory systems: Secondary | ICD-10-CM

## 2018-01-01 LAB — BASIC METABOLIC PANEL
ANION GAP: 5 (ref 5–15)
BUN: 18 mg/dL (ref 8–23)
CALCIUM: 8.9 mg/dL (ref 8.9–10.3)
CO2: 35 mmol/L — ABNORMAL HIGH (ref 22–32)
CREATININE: 0.92 mg/dL (ref 0.44–1.00)
Chloride: 99 mmol/L (ref 98–111)
Glucose, Bld: 95 mg/dL (ref 70–99)
Potassium: 4.3 mmol/L (ref 3.5–5.1)
SODIUM: 139 mmol/L (ref 135–145)

## 2018-01-01 LAB — CBC
HCT: 33.1 % — ABNORMAL LOW (ref 36.0–46.0)
Hemoglobin: 9.8 g/dL — ABNORMAL LOW (ref 12.0–15.0)
MCH: 28 pg (ref 26.0–34.0)
MCHC: 29.6 g/dL — ABNORMAL LOW (ref 30.0–36.0)
MCV: 94.6 fL (ref 78.0–100.0)
PLATELETS: 293 10*3/uL (ref 150–400)
RBC: 3.5 MIL/uL — AB (ref 3.87–5.11)
RDW: 15.8 % — ABNORMAL HIGH (ref 11.5–15.5)
WBC: 8.5 10*3/uL (ref 4.0–10.5)

## 2018-01-01 LAB — GLUCOSE, CAPILLARY
GLUCOSE-CAPILLARY: 66 mg/dL — AB (ref 70–99)
GLUCOSE-CAPILLARY: 140 mg/dL — AB (ref 70–99)
Glucose-Capillary: 117 mg/dL — ABNORMAL HIGH (ref 70–99)
Glucose-Capillary: 114 mg/dL — ABNORMAL HIGH (ref 70–99)

## 2018-01-01 LAB — MAGNESIUM: MAGNESIUM: 1.9 mg/dL (ref 1.7–2.4)

## 2018-01-01 MED ORDER — BUMETANIDE 0.25 MG/ML IJ SOLN
2.5000 mg | Freq: Three times a day (TID) | INTRAMUSCULAR | Status: DC
  Administered 2018-01-01 – 2018-01-03 (×7): 2.5 mg via INTRAVENOUS
  Filled 2018-01-01 (×8): qty 10

## 2018-01-01 MED ORDER — INSULIN GLARGINE 100 UNIT/ML ~~LOC~~ SOLN
30.0000 [IU] | Freq: Every day | SUBCUTANEOUS | Status: DC
  Administered 2018-01-01: 30 [IU] via SUBCUTANEOUS
  Filled 2018-01-01: qty 0.3

## 2018-01-01 MED ORDER — ALPRAZOLAM 0.25 MG PO TABS
0.2500 mg | ORAL_TABLET | Freq: Three times a day (TID) | ORAL | Status: DC | PRN
  Administered 2018-01-01 – 2018-01-03 (×3): 0.25 mg via ORAL
  Filled 2018-01-01 (×3): qty 1

## 2018-01-01 MED ORDER — INSULIN ASPART 100 UNIT/ML ~~LOC~~ SOLN
3.0000 [IU] | Freq: Three times a day (TID) | SUBCUTANEOUS | Status: DC
  Administered 2018-01-01 – 2018-01-02 (×4): 3 [IU] via SUBCUTANEOUS

## 2018-01-01 NOTE — Progress Notes (Addendum)
Subjective: Patient was seen and evaluated at bedside on morning rounds. Complained of some shortness of breath when moved to the toilet next to her as well as chest pain with intensity of 4/10. C.P get mildly worsen with deep breath. No N/V. Endorse some panic attack (when went to pee). Also complains of insomnia last night. Her husband is in room and mentions that she does not pee as much as previous days.  Objective:  Vital signs in last 24 hours: Vitals:   12/31/17 1932 12/31/17 2116 01/01/18 0549 01/01/18 0856  BP: (!) 100/49  115/65   Pulse: 81  80   Resp:   16   Temp: 98.1 F (36.7 C)  98.1 F (36.7 C)   TempSrc: Oral  Oral   SpO2:  97% 98% 97%  Weight:   106.7 kg   Height:       Physical Exam  Constitutional: She is oriented to person, place, and time. She appears well-developed and well-nourished.  Eyes: EOM are normal.  Cardiovascular: Normal rate, regular rhythm and normal heart sounds.  No murmur heard. Pulmonary/Chest: Effort normal. No respiratory distress. She has decreased breath sounds. She has wheezes in the left upper field. She has rales in the right lower field, the left middle field and the left lower field.  Abdominal: Soft. There is tenderness.  Musculoskeletal:       Right lower leg: She exhibits edema. She exhibits no tenderness.       Left lower leg: She exhibits edema. She exhibits no tenderness.  Neurological: She is alert and oriented to person, place, and time.  Psychiatric: She has a normal mood and affect. Her behavior is normal. Her mood appears not anxious. She is not agitated.    Assessment/Plan:  Principal Problem:   Acute on chronic heart failure (HCC) Active Problems:   Morbid obesity (HCC)  HFrEF Acute exacerbation. Has been on IV Lasix 60 mg BID since admission. Reports some shortness of breath this AM when moved to pee. Currently better.  Lung exam improved today. Has Unna boots. Still has lower extremity edema.  Out put  decreased since yesterday. Electrolyte and BUN, Cr stable. Will increase diuresis. Will switch to Bumex. - Stop furosemide 60 mg BID - Start IV Bumex, 2.5  mg before 6AM, 3 mg 10 Am and 3 mg at 2PM.  - Weight came down to 106 Kg today from 109.4 kg. I doubt these are accurate. Will continue to monitor fluid status and weight. - Potassium 4.3, Mg 1.9, and Cr:0.9, BUN 18 -Continue metoprolol succinate 12.5 mg daily.  - Discharge on anACE or ARB.  CAD. Reports some chest pain this AM, got mildly worse when she breath. Seems non cardiac. Will monitor her. - Continue aspirin, Plavix, and metoprolol - Discharge on anACE or ARB.  Adrenal insufficiency - ContinueHydrocortisone 15 mg in the a.m., and 5 mg in the p.m. - ContinueFludricortisone0.1 mg daily  DM  - Today BG:90, Capillary  - Decrease Lantus to 30 unit at bedtime  - Start Novolog 3 units TID with meals - continue monitor BG  HTN  - Patient is currently normotensive  - Continue Metoprolol   COPD on chronic oxygen therapy Patient complains of cough but can not bring up any mucus.  Lung exam is assymetrical. Decreased sound at right and crackle at left. - Continue home oxygen of 2L/min  - Continue albuterol, Dounebs, Breo, and Incruse -CXR  Panic attack and insomnia: Mentions that Ramelteon does not  work -Alprazolam 0.25 mg 3 times daily PRN and before sleep PRN   Dispo: Anticipated discharge in approximately 3-4 day(s), as clinical improvement.   Dewayne Hatch, MD 01/01/2018, 10:20 AM Pager: [phone]

## 2018-01-01 NOTE — Progress Notes (Signed)
Inpatient Diabetes Program Recommendations  AACE/ADA: New Consensus Statement on Inpatient Glycemic Control (2019)  Target Ranges:  Prepandial:   less than 140 mg/dL      Peak postprandial:   less than 180 mg/dL (1-2 hours)      Critically ill patients:  140 - 180 mg/dL   Results for Crystal Yates, Crystal Yates (MRN [id number]) as of 01/01/2018 08:58  Ref. Range 12/31/2017 07:21 12/31/2017 11:39 12/31/2017 16:52 12/31/2017 20:58 01/01/2018 07:31  Glucose-Capillary Latest Ref Range: 70 - 99 mg/dL 110 (H) 138 (H) 246 (H) 227 (H) 66 (L)   Review of Glycemic Control  Diabetes history: DM2 Outpatient Diabetes medications: Tresiba 40 units QHS, Fiasp 2-14 units TID with meals, Trulicity 1.5 mg Qweek (Sunday) Current orders for Inpatient glycemic control: Lantus 40 units QHS, Novolog 0-15 units TID with meals, Novolog 0-5 units QHS; Cortef 15 mg QAM, Cortef 5 mg daily  Inpatient Diabetes Program Recommendations: Insulin-Basal: Fasting glucose 66 mg/dl this morning. Please consider decreasing Lantus to 37 units QHS. Insulin - Meal Coverage: If Cortef is continued as ordered and post prandial glucose continues to be consistently greater than 180 mg/dl, please consider ordering Novolog 3 units TID with meals for meal coverage if patient eats at least 50% of meals.  Thanks, Barnie Alderman, RN, MSN, CDE Diabetes Coordinator Inpatient Diabetes Program [phone] (Team Pager from 8am to 5pm)

## 2018-01-02 DIAGNOSIS — I110 Hypertensive heart disease with heart failure: Secondary | ICD-10-CM | POA: Diagnosis not present

## 2018-01-02 DIAGNOSIS — I2510 Atherosclerotic heart disease of native coronary artery without angina pectoris: Secondary | ICD-10-CM | POA: Diagnosis not present

## 2018-01-02 DIAGNOSIS — I5023 Acute on chronic systolic (congestive) heart failure: Secondary | ICD-10-CM | POA: Diagnosis not present

## 2018-01-02 DIAGNOSIS — E2740 Unspecified adrenocortical insufficiency: Secondary | ICD-10-CM | POA: Diagnosis not present

## 2018-01-02 LAB — GLUCOSE, CAPILLARY
GLUCOSE-CAPILLARY: 99 mg/dL (ref 70–99)
GLUCOSE-CAPILLARY: 216 mg/dL — AB (ref 70–99)
Glucose-Capillary: 69 mg/dL — ABNORMAL LOW (ref 70–99)
Glucose-Capillary: 62 mg/dL — ABNORMAL LOW (ref 70–99)
Glucose-Capillary: 55 mg/dL — ABNORMAL LOW (ref 70–99)
Glucose-Capillary: 153 mg/dL — ABNORMAL HIGH (ref 70–99)
Glucose-Capillary: 199 mg/dL — ABNORMAL HIGH (ref 70–99)

## 2018-01-02 LAB — BASIC METABOLIC PANEL
Anion gap: 8 (ref 5–15)
BUN: 13 mg/dL (ref 8–23)
CO2: 36 mmol/L — AB (ref 22–32)
Calcium: 8.6 mg/dL — ABNORMAL LOW (ref 8.9–10.3)
Chloride: 97 mmol/L — ABNORMAL LOW (ref 98–111)
Creatinine, Ser: 0.99 mg/dL (ref 0.44–1.00)
GFR calc Af Amer: >60 mL/min (ref >60)
GFR calc non Af Amer: 60 mL/min — ABNORMAL LOW (ref >60)
GLUCOSE: 78 mg/dL (ref 70–99)
POTASSIUM: 3.1 mmol/L — AB (ref 3.5–5.1)
Sodium: 141 mmol/L (ref 135–145)

## 2018-01-02 LAB — MAGNESIUM: Magnesium: 1.5 mg/dL — ABNORMAL LOW (ref 1.7–2.4)

## 2018-01-02 MED ORDER — NICOTINE 21 MG/24HR TD PT24
21.0000 mg | MEDICATED_PATCH | Freq: Every day | TRANSDERMAL | Status: DC
  Administered 2018-01-02 – 2018-01-03 (×2): 21 mg via TRANSDERMAL
  Filled 2018-01-02 (×2): qty 1

## 2018-01-02 MED ORDER — MAGNESIUM SULFATE 2 GM/50ML IV SOLN
2.0000 g | Freq: Once | INTRAVENOUS | Status: AC
  Administered 2018-01-02: 2 g via INTRAVENOUS
  Filled 2018-01-02: qty 50

## 2018-01-02 MED ORDER — INSULIN GLARGINE 100 UNIT/ML ~~LOC~~ SOLN
25.0000 [IU] | Freq: Every day | SUBCUTANEOUS | Status: DC
  Administered 2018-01-02: 25 [IU] via SUBCUTANEOUS
  Filled 2018-01-02: qty 0.25

## 2018-01-02 MED ORDER — INSULIN ASPART 100 UNIT/ML ~~LOC~~ SOLN
3.0000 [IU] | Freq: Three times a day (TID) | SUBCUTANEOUS | Status: DC
  Administered 2018-01-03 (×2): 3 [IU] via SUBCUTANEOUS

## 2018-01-02 MED ORDER — INSULIN ASPART 100 UNIT/ML ~~LOC~~ SOLN: 4.0000 [IU] | Freq: Three times a day (TID) | SUBCUTANEOUS | Status: DC

## 2018-01-02 MED ORDER — POTASSIUM CHLORIDE CRYS ER 20 MEQ PO TBCR
40.0000 meq | EXTENDED_RELEASE_TABLET | Freq: Two times a day (BID) | ORAL | Status: AC
  Administered 2018-01-02 (×2): 40 meq via ORAL
  Filled 2018-01-02 (×3): qty 2

## 2018-01-02 NOTE — Progress Notes (Signed)
   Subjective: Patient is doing well today. Reports some episodes of panic attack this AM. She has not used Alprazolam PRN but instead, has used that as prophylaxis, that did not worked. Denies chest pain, but has some SOB with panic attack. Also reports some mild body ache. No other symptoms.  Objective:  Vital signs in last 24 hours: Vitals:   01/01/18 1920 01/01/18 2100 01/02/18 0315 01/02/18 0336  BP: (!) 102/53   118/73  Pulse: 84   80  Resp: 18   18  Temp: 97.7 F (36.5 C)   97.7 F (36.5 C)  TempSrc: Oral   Oral  SpO2: 98% 97%  97%  Weight:   106.2 kg   Height:       Physical Exam  Constitutional: She is oriented to person, place, and time. She appears well-developed and well-nourished.  Cardiovascular: Normal rate and regular rhythm.  No murmur heard. Pulmonary/Chest: Effort normal. No respiratory distress. She has wheezes in the right lower field and the left lower field. She has rales in the right lower field and the left lower field. She exhibits no tenderness.  Abdominal: Soft. Bowel sounds are normal. There is no tenderness.  Musculoskeletal:       Right lower leg: She exhibits edema.       Left lower leg: She exhibits edema.  Neurological: She is alert and oriented to person, place, and time.    Assessment/Plan:  Principal Problem:   Acute on chronic HFrEF (heart failure with reduced ejection fraction) (HCC) Active Problems:   Acute on chronic heart failure (HCC)   Morbid obesity (HCC)   Respiratory crackles at left lung base CHF exacerbation: Still volume overload on exam but generally improved. Switched to Bumex yesterday. Has Unna boots. Weight 106 kg today from 106 kg yesterday.(?) I/O:net - 1690 yesterday. (total out put since admission: 2950) Electrolyte: K: 3.1, Mg: 1.5 and Cr 0.99 -Mg 2 mg IV -K-dur 40 meq twice for today - Continue IV Bumex, 2.5  mg 6AM, 2.5 mg 10 Am and 2.5mg  at 2PM. -Continue to monitor fluid status and weight and I/O -Continue  metoprolol succinate 12.5 mg daily. - Discharge on anACE or ARB. -BMP and Mg tomorrow  CAD.  Asymptomatic - Continue aspirin, Plavix, and metoprolol - Discharge on anACE or ARB.  Adrenal insufficiency - ContinueHydrocortisone 15 mg in the a.m., and 5 mg in the p.m. - ContinueFludricortisone0.1 mg daily  DM: Still hypoglycemic at a.m.  Today AM BG:66, Capillary (Last night:114) -Decrease bedtime Lantus to 25 unit at bedtime  - C/w Novolog 3 units TID with meals, and moderate SSI - continue monitor BG  HTN  - Patient is currently normotensive - Continue Metoprolol   COPD on chronic oxygen therapy: No major shortness of breath other than the time that she has panic attack.  Reports some mild dry cough.  Stable Clinical exam and CXR are not suggestive of pneumonia - Continue home oxygen of 2L/min  - Continue albuterol, Dounebs, Breo, and Incruse  Panic attack and insomnia: Mentions that Ramelteon does not work -Alprazolam 0.25 mg 3 times daily PRN and before sleep PRN   Dispo: Anticipated discharge in approximately 2-3 day(s).   Dewayne Hatch, MD 01/02/2018, 5:26 AM Pager: [phone]

## 2018-01-02 NOTE — Progress Notes (Signed)
Patient refuses meal coverage insulin D/T drop this morning.

## 2018-01-02 NOTE — Progress Notes (Signed)
Dressing change to stomach done.

## 2018-01-02 NOTE — Care Management Note (Signed)
Case Management Note  Patient Details  Name: Crystal Yates MRN: [id number] Date of Birth: [birth year]  Subjective/Objective: CHF, recent abd surgery at [facility]                 Action/Plan: Patient lives at home with spouse; PCP Dortha Kern, [place]; has private insurance with BCBS with prescription drug coverage; she is active with [facility] as prior to admission for [facility], nurses aide; CM will continue to follow for progression of care.  Expected Discharge Date:     Possibly 01/05/2018             Expected Discharge Plan:  [place]  Discharge planning Services  CM Consult    Status of Service:  In process, will continue to follow  Sherrilyn Rist [phone] 01/02/2018, 2:57 PM

## 2018-01-02 NOTE — Plan of Care (Signed)

## 2018-01-02 NOTE — Progress Notes (Signed)
Inpatient Diabetes Program Recommendations  AACE/ADA: New Consensus Statement on Inpatient Glycemic Control (2015)  Target Ranges:  Prepandial:   less than 140 mg/dL      Peak postprandial:   less than 180 mg/dL (1-2 hours)      Critically ill patients:  140 - 180 mg/dL   Results for ONEAL, BIGLOW (MRN [id number]) as of 01/02/2018 07:30  Ref. Range 01/01/2018 07:31 01/01/2018 12:00 01/01/2018 17:06 01/01/2018 21:33 01/02/2018 04:59  Glucose-Capillary Latest Ref Range: 70 - 99 mg/dL 66 (L) 140 (H) 117 (H) 114 (H) 69 (L)   Review of Glycemic Control Diabetes history:DM2 Outpatient Diabetes medications:Tresiba 40 units QHS, Fiasp 2-14 units TID with meals, Trulicity 1.5 mg Qweek (Sunday) Current orders for Inpatient glycemic control:Lantus 30 units QHS, Novolog 0-15 units TID with meals, Novolog 0-5 units QHS, Novolog 3 units TID with meals for meal coverage; Cortef 15 mg QAM, Cortef 5 mg daily  Inpatient Diabetes Program Recommendations: Insulin-Basal: Fasting glucose 66 mg/dl this morning. Please consider decreasing Lantus to 25 units QHS.  Thanks, Barnie Alderman, RN, MSN, CDE Diabetes Coordinator Inpatient Diabetes Program [phone] (Team Pager from 8am to 5pm)

## 2018-01-02 NOTE — Progress Notes (Signed)
Capillary blood glucose 62, rechecked after juice, now 99.

## 2018-01-03 DIAGNOSIS — I130 Hypertensive heart and chronic kidney disease with heart failure and stage 1 through stage 4 chronic kidney disease, or unspecified chronic kidney disease: Secondary | ICD-10-CM | POA: Diagnosis not present

## 2018-01-03 DIAGNOSIS — N189 Chronic kidney disease, unspecified: Secondary | ICD-10-CM | POA: Diagnosis not present

## 2018-01-03 DIAGNOSIS — I255 Ischemic cardiomyopathy: Secondary | ICD-10-CM

## 2018-01-03 DIAGNOSIS — I5023 Acute on chronic systolic (congestive) heart failure: Secondary | ICD-10-CM | POA: Diagnosis not present

## 2018-01-03 DIAGNOSIS — E1122 Type 2 diabetes mellitus with diabetic chronic kidney disease: Secondary | ICD-10-CM | POA: Diagnosis not present

## 2018-01-03 DIAGNOSIS — E1165 Type 2 diabetes mellitus with hyperglycemia: Secondary | ICD-10-CM | POA: Diagnosis not present

## 2018-01-03 DIAGNOSIS — R404 Transient alteration of awareness: Secondary | ICD-10-CM | POA: Diagnosis not present

## 2018-01-03 DIAGNOSIS — R079 Chest pain, unspecified: Secondary | ICD-10-CM | POA: Diagnosis not present

## 2018-01-03 DIAGNOSIS — R0789 Other chest pain: Secondary | ICD-10-CM | POA: Diagnosis not present

## 2018-01-03 LAB — BASIC METABOLIC PANEL
Anion gap: 5 (ref 5–15)
BUN: 15 mg/dL (ref 8–23)
CALCIUM: 8.5 mg/dL — AB (ref 8.9–10.3)
CO2: 38 mmol/L — ABNORMAL HIGH (ref 22–32)
CREATININE: 1.05 mg/dL — AB (ref 0.44–1.00)
Chloride: 97 mmol/L — ABNORMAL LOW (ref 98–111)
GFR calc Af Amer: >60 mL/min (ref >60)
GFR, EST NON AFRICAN AMERICAN: 56 mL/min — AB (ref >60)
GLUCOSE: 108 mg/dL — AB (ref 70–99)
Potassium: 4.1 mmol/L (ref 3.5–5.1)
Sodium: 140 mmol/L (ref 135–145)

## 2018-01-03 LAB — GLUCOSE, CAPILLARY
GLUCOSE-CAPILLARY: 196 mg/dL — AB (ref 70–99)
Glucose-Capillary: 194 mg/dL — ABNORMAL HIGH (ref 70–99)

## 2018-01-03 LAB — MAGNESIUM: Magnesium: 1.8 mg/dL (ref 1.7–2.4)

## 2018-01-03 MED ORDER — ALPRAZOLAM 0.25 MG PO TABS: 0.2500 mg | ORAL_TABLET | Freq: Three times a day (TID) | ORAL | 0 refills | Status: DC | PRN

## 2018-01-03 NOTE — Discharge Summary (Signed)
Name: Crystal Yates MRN: [id number] DOB: Aug 04, 1955 62 y.o. PCP: Dortha Kern, PA  Date of Admission: 12/27/2017  6:50 AM Date of Discharge:  Attending Physician: Lucious Groves, DO  Discharge Diagnosis: 1. Acute on Chronic Combined Heart Failure Exacerbation  Discharge Medications: Allergies as of 01/03/2018      Reactions   Hydroxychloroquine Shortness Of Breath, Nausea Only, Other (See Comments)   Dizziness (also)   Donepezil Other (See Comments)   Dizziness, depression, and makes the patient feel "funny"   Prednisone Other (See Comments)   Causes depression and suicidal thoughts   Anticoagulant Cit Dext [acd Formula A] Other (See Comments)   Unknown   Bupropion Other (See Comments)   Suicidal thoughts   Metrizamide Other (See Comments)   (a non-ionic radiopaque contrast agent) "Blows the vein" and contrast gathers at the injected site's limb   Varenicline Other (See Comments)   Suicidal thoughts   Tape Rash, Other (See Comments)   Paper tape is preferred, PLEASE      Medication List    TAKE these medications   acetaminophen 325 MG tablet Commonly known as:  TYLENOL Take 2 tablets (650 mg total) by mouth every 4 (four) hours as needed for headache or mild pain.   ALPRAZolam 0.25 MG tablet Commonly known as:  XANAX Take 1 tablet (0.25 mg total) by mouth 3 (three) times daily as needed for anxiety (Panic attack).   aspirin 81 MG chewable tablet Chew 1 tablet (81 mg total) by mouth daily.   atorvastatin 80 MG tablet Commonly known as:  LIPITOR Take 1 tablet (80 mg total) by mouth daily at 6 PM.   CALCIUM PO Take 1 tablet by mouth daily.   clopidogrel 75 MG tablet Commonly known as:  PLAVIX Take 75 mg by mouth daily.   DULoxetine 60 MG capsule Commonly known as:  CYMBALTA Take 60 mg by mouth daily.   EMGALITY 120 MG/ML Soaj Generic drug:  Galcanezumab-gnlm Inject 120 mg into the skin every 30 (thirty) days.   FIASP FLEXTOUCH 100 UNIT/ML  Sopn Generic drug:  Insulin Aspart (w/Niacinamide) Inject 2-14 Units into the skin See admin instructions. Use three times day as needed before meals per sliding scale   fludrocortisone 0.1 MG tablet Commonly known as:  FLORINEF Take 1 tablet (0.1 mg total) by mouth 2 (two) times daily.   FUSION PLUS Caps Take 1 capsule by mouth daily.   HYDROcodone-acetaminophen 5-325 MG tablet Commonly known as:  NORCO/VICODIN Take 1 tablet by mouth every 6 (six) hours as needed for pain.   hydrocortisone 5 MG tablet Commonly known as:  CORTEF Take 5-15 mg by mouth See admin instructions. Take 15 mg by mouth in the morning and 5 mg in the afternoon   ipratropium-albuterol 0.5-2.5 (3) MG/3ML Soln Commonly known as:  DUONEB Take 3 mLs by nebulization 4 (four) times daily.   levothyroxine 150 MCG tablet Commonly known as:  SYNTHROID, LEVOTHROID Take 150 mcg by mouth daily before breakfast.   Magnesium 400 MG Tabs Take 400 mg by mouth daily.   metoprolol succinate 25 MG 24 hr tablet Commonly known as:  TOPROL-XL Take 0.5 tablets (12.5 mg total) by mouth daily.   nitroGLYCERIN 0.4 MG SL tablet Commonly known as:  NITROSTAT Place 0.4 mg under the tongue every 5 (five) minutes as needed for chest pain.   oxybutynin 15 MG 24 hr tablet Commonly known as:  DITROPAN XL Take 15 mg by mouth at bedtime.   oxyCODONE  5 MG immediate release tablet Commonly known as:  Oxy IR/ROXICODONE Take 5 mg by mouth every 4 (four) hours as needed for pain.   polyethylene glycol packet Commonly known as:  MIRALAX / GLYCOLAX Take 17 g by mouth daily.   potassium chloride SA 20 MEQ tablet Commonly known as:  K-DUR,KLOR-CON Take 1 tablet (20 mEq total) by mouth daily.   pramipexole 1 MG tablet Commonly known as:  MIRAPEX Take 1 mg by mouth 2 (two) times daily.   PROAIR HFA 108 (90 Base) MCG/ACT inhaler Generic drug:  albuterol Inhale 2 puffs into the lungs See admin instructions. Inhale 2 puffs into the  lungs every 4-6 hours as needed for shortness of breath or wheezing   promethazine 12.5 MG tablet Commonly known as:  PHENERGAN Take 12.5 mg by mouth every 6 (six) hours as needed for nausea.   torsemide 20 MG tablet Commonly known as:  DEMADEX Take 2 tablets (40 mg total) by mouth 2 (two) times daily.   TRELEGY ELLIPTA 100-62.5-25 MCG/INH Aepb Generic drug:  Fluticasone-Umeclidin-Vilant Inhale 1 puff into the lungs daily.   TRESIBA FLEXTOUCH 100 UNIT/ML Sopn FlexTouch Pen Generic drug:  insulin degludec Inject 40 Units into the skin at bedtime.   TRULICITY 1.5 KX/3.8HW Sopn Generic drug:  Dulaglutide Inject 1.5 mg into the skin every Sunday.   Vitamin D3 2000 units capsule Take 2,000 Units by mouth daily.       Disposition and follow-up:   Crystal Yates was discharged from [facility] in Stable condition.  At the hospital follow up visit please address:  1.  HFrEF. Please assess Crystal Yates's volume status and change her diuretic therapy accordingly. The patient was previously on Entresto but it was discontinued due to hypotension while hospitalized earlier this year. Please discuss starting an ACE or ARB with the patient. She would benefit from a mortality standpoint considering her CAD and HFrEF.   2.  Labs / imaging needed at time of follow-up: BMP  3.  Pending labs/ test needing follow-up: None  Follow-up Appointments: Follow-up Information    Dortha Kern, PA Follow up on 01/08/2018.   Specialty:  Physician Assistant Why:  [facility] clinic in [place] On 01/08/2018 at 2:20pm, please arrive 30 minutes early  Contact information: [address]  [place] [address] [phone]        Park Liter, MD .   Specialty:  Cardiology Contact information: [place] [address] [phone]          Hospital Course by problem list:  Acute on Chronic Combined Heart Failure  Exacerbation. Crystal Yates is a 62 year old female with an extensive past medical history including chronic respiratory failure on 2 L nasal cannula baseline, ischemic cardiomyopathy with reduced ejection fraction now recovered status post PCI and medical management, CAD status post drug-eluting stent placement, CKD, and recent abdominal surgery secondary to intra-abdominal adhesions and recent G.I. bleed who was admitted on 10/2 with acute on chronic combined heart failure exacerbation. On physical exam she was found to have severe anasarca and her weight was approximately 10 kg higher than her reported dry weight of 96.6 kg. She was aggressively diuresis with IV furosemide but began to have decreased urine output so she was acutely switch to IV Bumex with good response. Unna boots were placed. Her severe anasarca significantly improved and the patient returned back to her home oxygen requirement. While her recorded weights here in the hospital are  still greater than her reported dry weight, the patient was anxious to leave the hospital. After joint decision-making the patient was discharged with close hospital follow-up. She was given instructions on how to titrate her torsemide in case she experiences weight gain.  Panic attacks. During the course of the patient's hospitalization she was noted to have frequent severe panic attacks in which she would feel that she cannot breathe, she would be diaphoretic, and have a feeling of impending doom. A brief trial of Xanax was tried which significantly decreased the frequency of these episodes and the severity. She was discharged with a short prescription and told to follow up with her primary care provider as soon as possible.  Discharge Vitals:   BP 125/65 (BP Location: Right Arm)   Pulse 81   Temp 98.2 F (36.8 C) (Oral)   Resp 18   Ht 5\' 2"  (1.575 m)   Wt 106.6 kg Comment: scale b  SpO2 100%   BMI 43.00 kg/m   Pertinent Labs, Studies, and Procedures:    BMP Latest Ref Rng & Units 01/03/2018 01/02/2018 01/01/2018  Glucose 70 - 99 mg/dL 108(H) 78 95  BUN 8 - 23 mg/dL 15 13 18   Creatinine 0.44 - 1.00 mg/dL 1.05(H) 0.99 0.92  BUN/Creat Ratio 12 - 28 - - -  Sodium 135 - 145 mmol/L 140 141 139  Potassium 3.5 - 5.1 mmol/L 4.1 3.1(L) 4.3  Chloride 98 - 111 mmol/L 97(L) 97(L) 99  CO2 22 - 32 mmol/L 38(H) 36(H) 35(H)  Calcium 8.9 - 10.3 mg/dL 8.5(L) 8.6(L) 8.9   Discharge Instructions: Discharge Instructions    (HEART FAILURE PATIENTS) Call MD:  Anytime you have any of the following symptoms: 1) 3 pound weight gain in 24 hours or 5 pounds in 1 week 2) shortness of breath, with or without a dry hacking cough 3) swelling in the hands, feet or stomach 4) if you have to sleep on extra pillows at night in order to breathe.   Complete by:  As directed    Diet - low sodium heart healthy   Complete by:  As directed    Face-to-face encounter (required for Medicare/Medicaid patients)   Complete by:  As directed    I Ina Homes certify that this patient is under my care and that I, or a nurse practitioner or physician's assistant working with me, had a face-to-face encounter that meets the physician face-to-face encounter requirements with this patient on 01/03/2018. The encounter with the patient was in whole, or in part for the following medical condition(s) which is the primary reason for home health care (List medical condition): Heart failure exacerbation, open abdominal wounds  Wound care instructions per wound care: Add enzymatic debrider with saline moist gauze. Top with dry dressing. Change daily.   The encounter with the patient was in whole, or in part, for the following medical condition, which is the primary reason for home health care:  Acute heart failure exacerbation   I certify that, based on my findings, the following services are medically necessary home health services:  Nursing   Reason for Medically Necessary Home Health Services:    Skilled Nursing- Changes in Medication/Medication Management Other See Comments Skilled Nursing- Post-Surgical Wound Assessment and Care     My clinical findings support the need for the above services:  OTHER SEE COMMENTS   Further, I certify that my clinical findings support that this patient is homebound due to:   Shortness of Breath with activity Open/draining  pressure/stasis ulcer     Home Health   Complete by:  As directed    To provide the following care/treatments:   [place]     Increase activity slowly   Complete by:  As directed      Signed: Ina Homes, MD 01/03/2018, 10:34 AM

## 2018-01-03 NOTE — Progress Notes (Signed)
Patient ready for d/c. All personal belongings with patient. Pt demonstrates no s/sx of distress.  All d/c instructions reviewed with patient. Emphasized importance of maintaining medication regimine, follow up appts, dietary compliance and fluid intake compliance. `

## 2018-01-03 NOTE — Progress Notes (Signed)
Patient is for discharge home today; DC summary and orders faxed to [facility] as requested; fax # [phone]; Mindi Slicker RN,MHA,BSN [phone]

## 2018-01-03 NOTE — Progress Notes (Signed)
   Subjective: Patient is doing well today.  Slept well.  Had one episode of panic attack this morning with some sob that was controlled with as needed Xanax.  No chest pain.  Cough improved.  Objective:  Vital signs in last 24 hours: Vitals:   01/02/18 1937 01/02/18 1940 01/03/18 0630 01/03/18 0730  BP: 120/70  125/65   Pulse: 79  81   Resp:   18   Temp: 98 F (36.7 C)  98.2 F (36.8 C)   TempSrc: Oral  Oral   SpO2: 93% 92% 100% 100%  Weight:   106.6 kg   Height:       General: Pleasant, obese lady. in no acute distress CV: RRR, no murmur.  Normal S1 and S2 Lungs: Clear to auscultation bilaterally, no rale today.  Mild wheeze Abdomen: Soft and nontender.  Extremities: Extremities wrapped in Unna boots.  Still has 2+ pitting edema up to lower knees.  This has been significantly improved comparing to admission.  No swelling on upper extremities. Pulses are normal and palpable bilaterally Neurology exam: Alert and oriented x3  Assessment/Plan:  Principal Problem:   Acute on chronic HFrEF (heart failure with reduced ejection fraction) (HCC) Active Problems:   Acute on chronic heart failure (HCC)   Morbid obesity (HCC)   Respiratory crackles at left lung base  HFrEF: Acute exacerbation No shortness of breath. Lung exam is clear today.  Still has some pitting edema in lower extremities but improved since admission.  Electrolyte stable.  Mild elevation in creatinine (Cr:1.05 today, was 0.99 today,. BUN 15, was 13 yesterday.)  -Discharge today with continuing home dose of torsemide and rest of home medications -Follow-up with PCP for BMP  CAD.  Symptomatically -And continue aspirin, Plavix, and metoprolol -Follow-up with PCP for decision about startingACE or ARB.  Adrenal insufficiency - ContinueHydrocortisone 15 mg in the a.m., and 5 mg in the p.m. - ContinueFludricortisone0.1 mg daily -Follow-up with PCP DM:  No more hypoglycemia  -Continue home meds after  discharge  HTN  - Patient is currently normotensive - Continue home meds for discharge  COPD on chronic oxygen therapy:  Stable   - Continue home oxygen of 2L/min  - Continue albuterol, Dounebs, Breo, and Incruse  Panic attack and insomnia: Improved with as needed alprazolam -Continue alprazolam 0.25 mg 3 times daily PRN and before sleep PRN after discharge   Dispo: Discharge today  Dewayne Hatch, MD 01/03/2018, 12:40 PM Pager: [phone]

## 2018-01-03 NOTE — Discharge Instructions (Signed)
Thank you for allowing [place] to provide your care. We have scheduled an appointment with you primary care provider, Crystal Yates, on 01/08/2018 at @2 :20pm to establish care. If you have any questions in the meantime you will need to contact [facility]. It is essential that you keep this follow-up. We have started some new medications. If you notice that your weight has increased 3lbs in 1 day or > 5 lbs in 2 days you may need to increase your torsemide.   Heart Failure Action Plan A heart failure action plan helps you understand what to do when you have symptoms of heart failure. Follow the plan that was created by you and your health care provider. Review your plan each time you visit your health care provider.  Red zone These signs and symptoms mean you should get medical help right away:  You have trouble breathing when resting.  You have a dry cough that is getting worse.  You have swelling or pain in your legs or abdomen that is getting worse.  You suddenly gain more than 2-3 lb (0.9-1.4 kg) in a day, or more than 5 lb (2.3 kg) in one week. This amount may be more or less depending on your condition.  You have trouble staying awake or you feel confused.  You have chest pain.  You do not have an appetite.  You pass out.  If you experience any of these symptoms:  Call your local emergency services (911 in the [place] right away or seek help at the emergency department of the nearest hospital.  Yellow zone These signs and symptoms mean your condition may be getting worse and you should make some changes:  You have trouble breathing when you are active or you need to sleep with extra pillows.  You have swelling in your legs or abdomen.  You gain 2-3 lb (0.9-1.4 kg) in one day, or 5 lb (2.3 kg) in one week. This amount may be more or less depending on your condition.  You get tired easily.  You have trouble sleeping.  You have a dry cough.  If you experience any of  these symptoms:  Contact your health care provider within the next day.  Your health care provider may adjust your medicines.  Green zone These signs mean you are doing well and can continue what you are doing:  You do not have shortness of breath.  You have very little swelling or no new swelling.  Your weight is stable (no gain or loss).  You have a normal activity level.  You do not have chest pain or any other new symptoms.  Follow these instructions at home:  Take over-the-counter and prescription medicines only as told by your health care provider.  Weigh yourself daily. Your target weight is __________ lb (__________ kg). ? Call your health care provider if you gain more than __________ lb (__________ kg) in a day, or more than __________ lb (__________ kg) in one week.  Eat a heart-healthy diet. Work with a diet and nutrition specialist (dietitian) to create an eating plan that is best for you.  Keep all follow-up visits as told by your health care provider. This is important. Where to find more information:  American Heart Association: www.heart.org Summary  Follow the action plan that was created by you and your health care provider.  Get help right away if you have any symptoms in the Red zone. This information is not intended to replace advice given to  you by your health care provider. Make sure you discuss any questions you have with your health care provider. Document Released: 04/23/2016 Document Revised: 04/23/2016 Document Reviewed: 04/23/2016 Elsevier Interactive Patient Education  [employer].

## 2018-01-04 ENCOUNTER — Inpatient Hospital Stay (HOSPITAL_COMMUNITY)
Admission: EM | Admit: 2018-01-04 | Discharge: 2018-01-17 | DRG: 208 | Disposition: A | Discharge status: Home Health | Payer: Federal, State, Local not specified - PPO | Attending: Internal Medicine | Admitting: Internal Medicine

## 2018-01-04 ENCOUNTER — Emergency Department (HOSPITAL_COMMUNITY): Payer: Federal, State, Local not specified - PPO

## 2018-01-04 ENCOUNTER — Other Ambulatory Visit: Payer: Self-pay

## 2018-01-04 ENCOUNTER — Encounter (HOSPITAL_COMMUNITY): Payer: Self-pay | Admitting: Emergency Medicine

## 2018-01-04 DIAGNOSIS — Z833 Family history of diabetes mellitus: Secondary | ICD-10-CM

## 2018-01-04 DIAGNOSIS — I248 Other forms of acute ischemic heart disease: Secondary | ICD-10-CM | POA: Diagnosis not present

## 2018-01-04 DIAGNOSIS — Z79890 Hormone replacement therapy (postmenopausal): Secondary | ICD-10-CM

## 2018-01-04 DIAGNOSIS — E876 Hypokalemia: Secondary | ICD-10-CM | POA: Diagnosis not present

## 2018-01-04 DIAGNOSIS — G9341 Metabolic encephalopathy: Secondary | ICD-10-CM | POA: Diagnosis not present

## 2018-01-04 DIAGNOSIS — Z794 Long term (current) use of insulin: Secondary | ICD-10-CM

## 2018-01-04 DIAGNOSIS — I110 Hypertensive heart disease with heart failure: Secondary | ICD-10-CM | POA: Diagnosis not present

## 2018-01-04 DIAGNOSIS — Z8719 Personal history of other diseases of the digestive system: Secondary | ICD-10-CM | POA: Diagnosis not present

## 2018-01-04 DIAGNOSIS — J810 Acute pulmonary edema: Secondary | ICD-10-CM | POA: Insufficient documentation

## 2018-01-04 DIAGNOSIS — E1122 Type 2 diabetes mellitus with diabetic chronic kidney disease: Secondary | ICD-10-CM | POA: Diagnosis present

## 2018-01-04 DIAGNOSIS — I2510 Atherosclerotic heart disease of native coronary artery without angina pectoris: Secondary | ICD-10-CM | POA: Diagnosis not present

## 2018-01-04 DIAGNOSIS — J984 Other disorders of lung: Secondary | ICD-10-CM | POA: Diagnosis not present

## 2018-01-04 DIAGNOSIS — D120 Benign neoplasm of cecum: Secondary | ICD-10-CM | POA: Diagnosis not present

## 2018-01-04 DIAGNOSIS — J9602 Acute respiratory failure with hypercapnia: Secondary | ICD-10-CM | POA: Insufficient documentation

## 2018-01-04 DIAGNOSIS — K635 Polyp of colon: Secondary | ICD-10-CM | POA: Diagnosis present

## 2018-01-04 DIAGNOSIS — K642 Third degree hemorrhoids: Secondary | ICD-10-CM | POA: Diagnosis not present

## 2018-01-04 DIAGNOSIS — E785 Hyperlipidemia, unspecified: Secondary | ICD-10-CM | POA: Diagnosis present

## 2018-01-04 DIAGNOSIS — K648 Other hemorrhoids: Secondary | ICD-10-CM

## 2018-01-04 DIAGNOSIS — K621 Rectal polyp: Secondary | ICD-10-CM | POA: Diagnosis not present

## 2018-01-04 DIAGNOSIS — R402112 Coma scale, eyes open, never, at arrival to emergency department: Secondary | ICD-10-CM | POA: Diagnosis not present

## 2018-01-04 DIAGNOSIS — Z9861 Coronary angioplasty status: Secondary | ICD-10-CM

## 2018-01-04 DIAGNOSIS — N183 Chronic kidney disease, stage 3 (moderate): Secondary | ICD-10-CM | POA: Diagnosis not present

## 2018-01-04 DIAGNOSIS — I130 Hypertensive heart and chronic kidney disease with heart failure and stage 1 through stage 4 chronic kidney disease, or unspecified chronic kidney disease: Secondary | ICD-10-CM | POA: Diagnosis not present

## 2018-01-04 DIAGNOSIS — T8131XD Disruption of external operation (surgical) wound, not elsewhere classified, subsequent encounter: Secondary | ICD-10-CM

## 2018-01-04 DIAGNOSIS — Z6839 Body mass index (BMI) 39.0-39.9, adult: Secondary | ICD-10-CM

## 2018-01-04 DIAGNOSIS — Z7902 Long term (current) use of antithrombotics/antiplatelets: Secondary | ICD-10-CM | POA: Diagnosis not present

## 2018-01-04 DIAGNOSIS — R0609 Other forms of dyspnea: Secondary | ICD-10-CM

## 2018-01-04 DIAGNOSIS — K644 Residual hemorrhoidal skin tags: Secondary | ICD-10-CM | POA: Clinically undetermined

## 2018-01-04 DIAGNOSIS — J441 Chronic obstructive pulmonary disease with (acute) exacerbation: Secondary | ICD-10-CM

## 2018-01-04 DIAGNOSIS — J9601 Acute respiratory failure with hypoxia: Secondary | ICD-10-CM | POA: Diagnosis not present

## 2018-01-04 DIAGNOSIS — K649 Unspecified hemorrhoids: Secondary | ICD-10-CM | POA: Diagnosis not present

## 2018-01-04 DIAGNOSIS — J449 Chronic obstructive pulmonary disease, unspecified: Secondary | ICD-10-CM | POA: Diagnosis present

## 2018-01-04 DIAGNOSIS — N179 Acute kidney failure, unspecified: Secondary | ICD-10-CM | POA: Diagnosis present

## 2018-01-04 DIAGNOSIS — J9621 Acute and chronic respiratory failure with hypoxia: Secondary | ICD-10-CM | POA: Diagnosis not present

## 2018-01-04 DIAGNOSIS — D123 Benign neoplasm of transverse colon: Secondary | ICD-10-CM | POA: Diagnosis not present

## 2018-01-04 DIAGNOSIS — D631 Anemia in chronic kidney disease: Secondary | ICD-10-CM | POA: Diagnosis present

## 2018-01-04 DIAGNOSIS — J9622 Acute and chronic respiratory failure with hypercapnia: Secondary | ICD-10-CM | POA: Diagnosis present

## 2018-01-04 DIAGNOSIS — F410 Panic disorder [episodic paroxysmal anxiety] without agoraphobia: Secondary | ICD-10-CM | POA: Diagnosis present

## 2018-01-04 DIAGNOSIS — I472 Ventricular tachycardia: Secondary | ICD-10-CM | POA: Diagnosis not present

## 2018-01-04 DIAGNOSIS — L7682 Other postprocedural complications of skin and subcutaneous tissue: Secondary | ICD-10-CM | POA: Diagnosis not present

## 2018-01-04 DIAGNOSIS — R402212 Coma scale, best verbal response, none, at arrival to emergency department: Secondary | ICD-10-CM | POA: Diagnosis not present

## 2018-01-04 DIAGNOSIS — Z9049 Acquired absence of other specified parts of digestive tract: Secondary | ICD-10-CM

## 2018-01-04 DIAGNOSIS — I5043 Acute on chronic combined systolic (congestive) and diastolic (congestive) heart failure: Secondary | ICD-10-CM | POA: Diagnosis present

## 2018-01-04 DIAGNOSIS — E8809 Other disorders of plasma-protein metabolism, not elsewhere classified: Secondary | ICD-10-CM | POA: Diagnosis not present

## 2018-01-04 DIAGNOSIS — E119 Type 2 diabetes mellitus without complications: Secondary | ICD-10-CM

## 2018-01-04 DIAGNOSIS — I5042 Chronic combined systolic (congestive) and diastolic (congestive) heart failure: Secondary | ICD-10-CM | POA: Diagnosis not present

## 2018-01-04 DIAGNOSIS — Z7982 Long term (current) use of aspirin: Secondary | ICD-10-CM

## 2018-01-04 DIAGNOSIS — K5730 Diverticulosis of large intestine without perforation or abscess without bleeding: Secondary | ICD-10-CM | POA: Diagnosis present

## 2018-01-04 DIAGNOSIS — D128 Benign neoplasm of rectum: Secondary | ICD-10-CM | POA: Diagnosis not present

## 2018-01-04 DIAGNOSIS — E2740 Unspecified adrenocortical insufficiency: Secondary | ICD-10-CM | POA: Diagnosis present

## 2018-01-04 DIAGNOSIS — R740 Nonspecific elevation of levels of transaminase and lactic acid dehydrogenase [LDH]: Secondary | ICD-10-CM | POA: Diagnosis not present

## 2018-01-04 DIAGNOSIS — T501X5A Adverse effect of loop [high-ceiling] diuretics, initial encounter: Secondary | ICD-10-CM | POA: Diagnosis not present

## 2018-01-04 DIAGNOSIS — Z91048 Other nonmedicinal substance allergy status: Secondary | ICD-10-CM

## 2018-01-04 DIAGNOSIS — E039 Hypothyroidism, unspecified: Secondary | ICD-10-CM | POA: Diagnosis not present

## 2018-01-04 DIAGNOSIS — R402312 Coma scale, best motor response, none, at arrival to emergency department: Secondary | ICD-10-CM | POA: Diagnosis present

## 2018-01-04 DIAGNOSIS — E2749 Other adrenocortical insufficiency: Secondary | ICD-10-CM | POA: Diagnosis present

## 2018-01-04 DIAGNOSIS — R509 Fever, unspecified: Secondary | ICD-10-CM | POA: Diagnosis not present

## 2018-01-04 DIAGNOSIS — R0600 Dyspnea, unspecified: Secondary | ICD-10-CM

## 2018-01-04 DIAGNOSIS — K5790 Diverticulosis of intestine, part unspecified, without perforation or abscess without bleeding: Secondary | ICD-10-CM | POA: Diagnosis not present

## 2018-01-04 DIAGNOSIS — Z823 Family history of stroke: Secondary | ICD-10-CM

## 2018-01-04 DIAGNOSIS — R0902 Hypoxemia: Secondary | ICD-10-CM | POA: Diagnosis not present

## 2018-01-04 DIAGNOSIS — K5731 Diverticulosis of large intestine without perforation or abscess with bleeding: Secondary | ICD-10-CM | POA: Diagnosis present

## 2018-01-04 DIAGNOSIS — Z9981 Dependence on supplemental oxygen: Secondary | ICD-10-CM

## 2018-01-04 DIAGNOSIS — I252 Old myocardial infarction: Secondary | ICD-10-CM

## 2018-01-04 DIAGNOSIS — R310 Gross hematuria: Secondary | ICD-10-CM | POA: Diagnosis not present

## 2018-01-04 DIAGNOSIS — R319 Hematuria, unspecified: Secondary | ICD-10-CM | POA: Diagnosis not present

## 2018-01-04 DIAGNOSIS — Z888 Allergy status to other drugs, medicaments and biological substances status: Secondary | ICD-10-CM

## 2018-01-04 DIAGNOSIS — R0989 Other specified symptoms and signs involving the circulatory and respiratory systems: Secondary | ICD-10-CM

## 2018-01-04 DIAGNOSIS — N390 Urinary tract infection, site not specified: Secondary | ICD-10-CM | POA: Diagnosis not present

## 2018-01-04 DIAGNOSIS — K625 Hemorrhage of anus and rectum: Secondary | ICD-10-CM | POA: Diagnosis not present

## 2018-01-04 DIAGNOSIS — I509 Heart failure, unspecified: Secondary | ICD-10-CM

## 2018-01-04 DIAGNOSIS — Z980 Intestinal bypass and anastomosis status: Secondary | ICD-10-CM | POA: Diagnosis not present

## 2018-01-04 DIAGNOSIS — N189 Chronic kidney disease, unspecified: Secondary | ICD-10-CM | POA: Diagnosis not present

## 2018-01-04 DIAGNOSIS — Z4682 Encounter for fitting and adjustment of non-vascular catheter: Secondary | ICD-10-CM | POA: Diagnosis not present

## 2018-01-04 DIAGNOSIS — E611 Iron deficiency: Secondary | ICD-10-CM | POA: Diagnosis present

## 2018-01-04 DIAGNOSIS — F17210 Nicotine dependence, cigarettes, uncomplicated: Secondary | ICD-10-CM | POA: Diagnosis present

## 2018-01-04 DIAGNOSIS — I5033 Acute on chronic diastolic (congestive) heart failure: Secondary | ICD-10-CM | POA: Diagnosis not present

## 2018-01-04 LAB — PROCALCITONIN: Procalcitonin: 0.62 ng/mL

## 2018-01-04 LAB — CBC WITH DIFFERENTIAL/PLATELET
ABS IMMATURE GRANULOCYTES: 0.41 10*3/uL — AB (ref 0.00–0.07)
Basophils Absolute: 0.1 10*3/uL (ref 0.0–0.1)
Basophils Relative: 1 %
Eosinophils Absolute: 0.1 10*3/uL (ref 0.0–0.5)
Eosinophils Relative: 1 %
HEMATOCRIT: 39.3 % (ref 36.0–46.0)
Hemoglobin: 11.4 g/dL — ABNORMAL LOW (ref 12.0–15.0)
IMMATURE GRANULOCYTES: 3 %
LYMPHS ABS: 5.2 10*3/uL — AB (ref 0.7–4.0)
Lymphocytes Relative: 41 %
MCH: 27.9 pg (ref 26.0–34.0)
MCHC: 29 g/dL — ABNORMAL LOW (ref 30.0–36.0)
MCV: 96.1 fL (ref 80.0–100.0)
MONOS PCT: 6 %
Monocytes Absolute: 0.8 10*3/uL (ref 0.1–1.0)
NEUTROS ABS: 6.3 10*3/uL (ref 1.7–7.7)
NEUTROS PCT: 48 %
Platelets: 465 10*3/uL — ABNORMAL HIGH (ref 150–400)
RBC: 4.09 MIL/uL (ref 3.87–5.11)
RDW: 15.5 % (ref 11.5–15.5)
WBC: 12.9 10*3/uL — ABNORMAL HIGH (ref 4.0–10.5)
nRBC: 0 % (ref 0.0–0.2)

## 2018-01-04 LAB — BLOOD GAS, ARTERIAL
Acid-Base Excess: 10.3 mmol/L — ABNORMAL HIGH (ref 0.0–2.0)
Bicarbonate: 35.4 mmol/L — ABNORMAL HIGH (ref 20.0–28.0)
Drawn by: 519031
FIO2: 50
LHR: 24 {breaths}/min
O2 SAT: 96 %
PATIENT TEMPERATURE: 98.6
PEEP: 5 cmH2O
PO2 ART: 82.7 mmHg — AB (ref 83.0–108.0)
VT: 400 mL
pCO2 arterial: 57.5 mmHg — ABNORMAL HIGH (ref 32.0–48.0)
pH, Arterial: 7.406 (ref 7.350–7.450)

## 2018-01-04 LAB — URINALYSIS, ROUTINE W REFLEX MICROSCOPIC
BILIRUBIN URINE: NEGATIVE
Glucose, UA: 150 mg/dL — AB
Hgb urine dipstick: NEGATIVE
Ketones, ur: NEGATIVE mg/dL
LEUKOCYTES UA: NEGATIVE
Nitrite: NEGATIVE
SPECIFIC GRAVITY, URINE: 1.015 (ref 1.005–1.030)
pH: 6 (ref 5.0–8.0)

## 2018-01-04 LAB — GLUCOSE, CAPILLARY
GLUCOSE-CAPILLARY: 137 mg/dL — AB (ref 70–99)
Glucose-Capillary: 176 mg/dL — ABNORMAL HIGH (ref 70–99)
Glucose-Capillary: 180 mg/dL — ABNORMAL HIGH (ref 70–99)
Glucose-Capillary: 269 mg/dL — ABNORMAL HIGH (ref 70–99)

## 2018-01-04 LAB — I-STAT ARTERIAL BLOOD GAS, ED
Acid-Base Excess: 10 mmol/L — ABNORMAL HIGH (ref 0.0–2.0)
BICARBONATE: 38.3 mmol/L — AB (ref 20.0–28.0)
O2 SAT: 100 %
PH ART: 7.317 — AB (ref 7.350–7.450)
PO2 ART: 318 mmHg — AB (ref 83.0–108.0)
Patient temperature: 98.6
TCO2: 41 mmol/L — AB (ref 22–32)
pCO2 arterial: 74.9 mmHg (ref 32.0–48.0)

## 2018-01-04 LAB — I-STAT CHEM 8, ED
BUN: 26 mg/dL — ABNORMAL HIGH (ref 8–23)
CHLORIDE: 96 mmol/L — AB (ref 98–111)
Calcium, Ion: 1.16 mmol/L (ref 1.15–1.40)
Creatinine, Ser: 1.3 mg/dL — ABNORMAL HIGH (ref 0.44–1.00)
Glucose, Bld: 246 mg/dL — ABNORMAL HIGH (ref 70–99)
HEMATOCRIT: 32 % — AB (ref 36.0–46.0)
Hemoglobin: 10.9 g/dL — ABNORMAL LOW (ref 12.0–15.0)
POTASSIUM: 3.9 mmol/L (ref 3.5–5.1)
SODIUM: 139 mmol/L (ref 135–145)
TCO2: 38 mmol/L — ABNORMAL HIGH (ref 22–32)

## 2018-01-04 LAB — COMPREHENSIVE METABOLIC PANEL
ALT: 90 U/L — AB (ref 0–44)
AST: 190 U/L — AB (ref 15–41)
Albumin: 2.8 g/dL — ABNORMAL LOW (ref 3.5–5.0)
Alkaline Phosphatase: 223 U/L — ABNORMAL HIGH (ref 38–126)
Anion gap: 8 (ref 5–15)
BUN: 18 mg/dL (ref 8–23)
CHLORIDE: 95 mmol/L — AB (ref 98–111)
CO2: 36 mmol/L — ABNORMAL HIGH (ref 22–32)
CREATININE: 1.26 mg/dL — AB (ref 0.44–1.00)
Calcium: 9.1 mg/dL (ref 8.9–10.3)
GFR calc Af Amer: 52 mL/min — ABNORMAL LOW (ref >60)
GFR, EST NON AFRICAN AMERICAN: 45 mL/min — AB (ref >60)
GLUCOSE: 335 mg/dL — AB (ref 70–99)
Potassium: 3.9 mmol/L (ref 3.5–5.1)
Sodium: 139 mmol/L (ref 135–145)
Total Bilirubin: 0.5 mg/dL (ref 0.3–1.2)
Total Protein: 6.8 g/dL (ref 6.5–8.1)

## 2018-01-04 LAB — MRSA PCR SCREENING: MRSA by PCR: NEGATIVE

## 2018-01-04 LAB — TROPONIN I
TROPONIN I: 0.1 ng/mL — AB (ref <0.03)
TROPONIN I: 0.09 ng/mL — AB (ref <0.03)
TROPONIN I: 0.06 ng/mL — AB (ref <0.03)

## 2018-01-04 LAB — SAMPLE TO BLOOD BANK

## 2018-01-04 LAB — I-STAT TROPONIN, ED: Troponin i, poc: 0.02 ng/mL (ref 0.00–0.08)

## 2018-01-04 LAB — I-STAT CG4 LACTIC ACID, ED: LACTIC ACID, VENOUS: 1.37 mmol/L (ref 0.5–1.9)

## 2018-01-04 LAB — BRAIN NATRIURETIC PEPTIDE: B NATRIURETIC PEPTIDE 5: 723.9 pg/mL — AB (ref 0.0–100.0)

## 2018-01-04 MED ORDER — FUROSEMIDE 10 MG/ML IJ SOLN
40.0000 mg | Freq: Once | INTRAMUSCULAR | Status: AC
  Administered 2018-01-04: 40 mg via INTRAVENOUS
  Filled 2018-01-04: qty 4

## 2018-01-04 MED ORDER — ACETAMINOPHEN 650 MG RE SUPP
650.0000 mg | Freq: Once | RECTAL | Status: AC
  Administered 2018-01-04: 650 mg via RECTAL
  Filled 2018-01-04: qty 1

## 2018-01-04 MED ORDER — FENTANYL 2500MCG IN NS 250ML (10MCG/ML) PREMIX INFUSION
0.0000 ug/h | INTRAVENOUS | Status: DC
  Administered 2018-01-04: 50 ug/h via INTRAVENOUS
  Filled 2018-01-04: qty 250

## 2018-01-04 MED ORDER — FLUDROCORTISONE ACETATE 0.1 MG PO TABS
0.1000 mg | ORAL_TABLET | Freq: Two times a day (BID) | ORAL | Status: DC
  Administered 2018-01-04 – 2018-01-10 (×13): 0.1 mg
  Filled 2018-01-04 (×13): qty 1

## 2018-01-04 MED ORDER — CHLORHEXIDINE GLUCONATE 0.12% ORAL RINSE (MEDLINE KIT)
15.0000 mL | Freq: Two times a day (BID) | OROMUCOSAL | Status: DC
  Administered 2018-01-04 – 2018-01-05 (×2): 15 mL via OROMUCOSAL

## 2018-01-04 MED ORDER — ETOMIDATE 2 MG/ML IV SOLN
INTRAVENOUS | Status: AC | PRN
  Administered 2018-01-04: 20 mg via INTRAVENOUS

## 2018-01-04 MED ORDER — FUROSEMIDE 10 MG/ML IJ SOLN
40.0000 mg | Freq: Four times a day (QID) | INTRAMUSCULAR | Status: AC
  Administered 2018-01-04 (×2): 40 mg via INTRAVENOUS
  Filled 2018-01-04 (×2): qty 4

## 2018-01-04 MED ORDER — PROPOFOL 1000 MG/100ML IV EMUL
5.0000 ug/kg/min | Freq: Once | INTRAVENOUS | Status: AC
  Administered 2018-01-04: 10 ug/kg/min via INTRAVENOUS

## 2018-01-04 MED ORDER — ATORVASTATIN CALCIUM 80 MG PO TABS
80.0000 mg | ORAL_TABLET | Freq: Every day | ORAL | Status: DC
  Administered 2018-01-04 – 2018-01-10 (×7): 80 mg
  Filled 2018-01-04 (×7): qty 1

## 2018-01-04 MED ORDER — SUCCINYLCHOLINE CHLORIDE 20 MG/ML IJ SOLN
INTRAMUSCULAR | Status: AC | PRN
  Administered 2018-01-04: 120 mg via INTRAVENOUS

## 2018-01-04 MED ORDER — VANCOMYCIN HCL IN DEXTROSE 1-5 GM/200ML-% IV SOLN: 1000.0000 mg | Freq: Once | INTRAVENOUS | Status: DC

## 2018-01-04 MED ORDER — METRONIDAZOLE IN NACL 5-0.79 MG/ML-% IV SOLN
500.0000 mg | Freq: Three times a day (TID) | INTRAVENOUS | Status: DC
  Administered 2018-01-04 (×2): 500 mg via INTRAVENOUS
  Filled 2018-01-04 (×3): qty 100

## 2018-01-04 MED ORDER — IPRATROPIUM-ALBUTEROL 0.5-2.5 (3) MG/3ML IN SOLN
3.0000 mL | Freq: Four times a day (QID) | RESPIRATORY_TRACT | Status: DC
  Administered 2018-01-04 – 2018-01-06 (×11): 3 mL via RESPIRATORY_TRACT
  Filled 2018-01-04 (×12): qty 3

## 2018-01-04 MED ORDER — WHITE PETROLATUM EX OINT
TOPICAL_OINTMENT | CUTANEOUS | Status: AC
  Administered 2018-01-04: 1
  Filled 2018-01-04: qty 28.35

## 2018-01-04 MED ORDER — SODIUM CHLORIDE 0.9 % IV SOLN
INTRAVENOUS | Status: DC
  Administered 2018-01-04: 1000 mL via INTRAVENOUS

## 2018-01-04 MED ORDER — VITAL HIGH PROTEIN PO LIQD
1000.0000 mL | ORAL | Status: DC
  Administered 2018-01-04: 1000 mL

## 2018-01-04 MED ORDER — MIDAZOLAM HCL 2 MG/2ML IJ SOLN: 2.0000 mg | INTRAMUSCULAR | Status: DC | PRN

## 2018-01-04 MED ORDER — FENTANYL CITRATE (PF) 100 MCG/2ML IJ SOLN: 50.0000 ug | Freq: Once | INTRAMUSCULAR | Status: DC

## 2018-01-04 MED ORDER — NOREPINEPHRINE 4 MG/250ML-% IV SOLN
INTRAVENOUS | Status: AC
  Administered 2018-01-04: 2 ug/min
  Filled 2018-01-04: qty 250

## 2018-01-04 MED ORDER — HEPARIN SODIUM (PORCINE) 5000 UNIT/ML IJ SOLN
5000.0000 [IU] | Freq: Three times a day (TID) | INTRAMUSCULAR | Status: DC
  Administered 2018-01-04 – 2018-01-10 (×19): 5000 [IU] via SUBCUTANEOUS
  Filled 2018-01-04 (×19): qty 1

## 2018-01-04 MED ORDER — FENTANYL 2500MCG IN NS 250ML (10MCG/ML) PREMIX INFUSION
25.0000 ug/h | INTRAVENOUS | Status: DC
  Administered 2018-01-04: 50 ug/h via INTRAVENOUS

## 2018-01-04 MED ORDER — VANCOMYCIN HCL 10 G IV SOLR
2000.0000 mg | Freq: Once | INTRAVENOUS | Status: AC
  Administered 2018-01-04: 2000 mg via INTRAVENOUS
  Filled 2018-01-04: qty 2000

## 2018-01-04 MED ORDER — COLLAGENASE 250 UNIT/GM EX OINT
TOPICAL_OINTMENT | Freq: Every day | CUTANEOUS | Status: DC
  Administered 2018-01-04: 1 via TOPICAL
  Administered 2018-01-05 – 2018-01-06 (×2): via TOPICAL
  Administered 2018-01-07: 1 via TOPICAL
  Administered 2018-01-08 – 2018-01-17 (×11): via TOPICAL
  Filled 2018-01-04: qty 30

## 2018-01-04 MED ORDER — BUDESONIDE 0.5 MG/2ML IN SUSP
0.5000 mg | Freq: Two times a day (BID) | RESPIRATORY_TRACT | Status: DC
  Administered 2018-01-04 – 2018-01-17 (×24): 0.5 mg via RESPIRATORY_TRACT
  Filled 2018-01-04 (×27): qty 2

## 2018-01-04 MED ORDER — ASPIRIN 81 MG PO CHEW
81.0000 mg | CHEWABLE_TABLET | Freq: Every day | ORAL | Status: DC
  Administered 2018-01-04 – 2018-01-10 (×7): 81 mg
  Filled 2018-01-04 (×8): qty 1

## 2018-01-04 MED ORDER — INSULIN ASPART 100 UNIT/ML ~~LOC~~ SOLN
0.0000 [IU] | SUBCUTANEOUS | Status: DC
  Administered 2018-01-04 – 2018-01-05 (×3): 4 [IU] via SUBCUTANEOUS
  Administered 2018-01-05: 15 [IU] via SUBCUTANEOUS
  Administered 2018-01-05: 11 [IU] via SUBCUTANEOUS
  Administered 2018-01-05: 15 [IU] via SUBCUTANEOUS
  Administered 2018-01-05: 11 [IU] via SUBCUTANEOUS
  Administered 2018-01-05: 4 [IU] via SUBCUTANEOUS
  Administered 2018-01-06: 7 [IU] via SUBCUTANEOUS
  Administered 2018-01-06: 4 [IU] via SUBCUTANEOUS

## 2018-01-04 MED ORDER — FENTANYL BOLUS VIA INFUSION
25.0000 ug | INTRAVENOUS | Status: DC | PRN
  Administered 2018-01-04 – 2018-01-05 (×2): 25 ug via INTRAVENOUS
  Filled 2018-01-04: qty 50

## 2018-01-04 MED ORDER — HYDROCORTISONE NA SUCCINATE PF 100 MG IJ SOLR
50.0000 mg | Freq: Four times a day (QID) | INTRAMUSCULAR | Status: DC
  Administered 2018-01-04 – 2018-01-05 (×4): 50 mg via INTRAVENOUS
  Filled 2018-01-04 (×4): qty 2

## 2018-01-04 MED ORDER — FAMOTIDINE 40 MG/5ML PO SUSR
20.0000 mg | Freq: Every day | ORAL | Status: DC
  Administered 2018-01-04 – 2018-01-11 (×6): 20 mg
  Filled 2018-01-04 (×9): qty 2.5

## 2018-01-04 MED ORDER — SODIUM CHLORIDE 0.9 % IV SOLN
2.0000 g | Freq: Once | INTRAVENOUS | Status: AC
  Administered 2018-01-04: 2 g via INTRAVENOUS
  Filled 2018-01-04: qty 2

## 2018-01-04 MED ORDER — MIDAZOLAM HCL 2 MG/2ML IJ SOLN
5.0000 mg | Freq: Once | INTRAMUSCULAR | Status: AC
  Administered 2018-01-04: 5 mg via INTRAVENOUS
  Filled 2018-01-04: qty 6

## 2018-01-04 MED ORDER — ORAL CARE MOUTH RINSE
15.0000 mL | OROMUCOSAL | Status: DC
  Administered 2018-01-04 – 2018-01-05 (×8): 15 mL via OROMUCOSAL

## 2018-01-04 MED ORDER — LEVOTHYROXINE SODIUM 75 MCG PO TABS
150.0000 ug | ORAL_TABLET | Freq: Every day | ORAL | Status: DC
  Administered 2018-01-04 – 2018-01-10 (×7): 150 ug
  Filled 2018-01-04: qty 1
  Filled 2018-01-04 (×3): qty 2
  Filled 2018-01-04: qty 1
  Filled 2018-01-04 (×2): qty 2
  Filled 2018-01-04: qty 1
  Filled 2018-01-04: qty 2

## 2018-01-04 MED ORDER — CLOPIDOGREL BISULFATE 75 MG PO TABS
75.0000 mg | ORAL_TABLET | Freq: Every day | ORAL | Status: DC
  Administered 2018-01-04 – 2018-01-11 (×8): 75 mg
  Filled 2018-01-04 (×9): qty 1

## 2018-01-04 MED ORDER — PROPOFOL 1000 MG/100ML IV EMUL
INTRAVENOUS | Status: AC
  Filled 2018-01-04: qty 100

## 2018-01-04 MED ORDER — FENTANYL BOLUS VIA INFUSION
50.0000 ug | INTRAVENOUS | Status: DC | PRN
  Administered 2018-01-04: 25 ug via INTRAVENOUS
  Filled 2018-01-04: qty 50

## 2018-01-04 NOTE — Progress Notes (Signed)
PCCM Interval Note  Chart reviewed and patient examined, was admitted this morning.  Plan to continue her current mechanical ventilation strategy, sedation.  Continue scheduled Lasix and follow chest x-ray for improvement with volume removal.  Tight blood pressure control.  Given the appearance of her infiltrates and in the absence of fever I think that we can stop empiric antibiotics and follow clinically.  Low threshold to restart if she develops fever or progressive leukocytosis etc.  Initiate the ICU hyperglycemia protocol today.  We will try spontaneous breathing trials if her pulmonary edema improves, will likely want to perform on low PEEP to assess for possible flash pulmonary edema prior to extubation.  Independent CC time 20 minutes  Baltazar Apo, MD, PhD 01/04/2018, 3:47 PM  Pulmonary and Critical Care [phone] or if no answer [phone]

## 2018-01-04 NOTE — ED Notes (Signed)
IV team starting line will collect 2nd set of blood cultures if unsuccessful, RN to notify NT for blood culture draw.

## 2018-01-04 NOTE — ED Provider Notes (Signed)
TIME SEEN: 12:59 AM  CHIEF COMPLAINT: Respiratory failure  HPI: Patient is a 62 year old female with history of CAD, CHF, chronic kidney disease, COPD, hypertension, diabetes, hyperlipidemia who is on home oxygen who presents to the emergency department EMS with shortness of breath, respiratory distress.  History is obtained from EMS.  Patient got up to have a bowel movement and began to feel short of breath.  DuoNeb given in route.  Patient mostly unresponsive upon arrival.  ROS: Level 5 caveat for respiratory distress and altered mental status  PAST MEDICAL HISTORY/PAST SURGICAL HISTORY:  Past Medical History:  Diagnosis Date  . Acute on chronic systolic heart failure exacerbation(HCC) 04/08/2016  . Arthritis   . CAD in native artery    a. Prior LAD stenting based on cath. b. RCA stenting 03/2016 x2.  . Chronic combined systolic and diastolic CHF (congestive heart failure) ([place])   . CKD (chronic kidney disease), stage II   . COPD (chronic obstructive pulmonary disease) ([place])   . Diabetes mellitus without complication ([place])   . Dyspnea   . Hashimoto's thyroiditis   . Hyperlipidemia   . Hypertension   . Myocardial infarction ([place])   . On home oxygen therapy    "2L; 24/7" (10/23/2017)  . Secondary adrenal insufficiency ([place])   . Thyroid disease   . Tobacco abuse     MEDICATIONS:  Prior to Admission medications   Medication Sig Start Date End Date Taking? Authorizing Provider  acetaminophen (TYLENOL) 325 MG tablet Take 2 tablets (650 mg total) by mouth every 4 (four) hours as needed for headache or mild pain. 10/14/17   Isaiah Serge, NP  albuterol [facility] HFA) 108 (90 Base) MCG/ACT inhaler Inhale 2 puffs into the lungs See admin instructions. Inhale 2 puffs into the lungs every 4-6 hours as needed for shortness of breath or wheezing 07/24/17   [provider]  ALPRAZolam Duanne Moron) 0.25 MG tablet Take 1 tablet (0.25 mg total) by mouth 3 (three) times daily as needed for anxiety  (Panic attack). 01/03/18   Ina Homes, MD  aspirin 81 MG chewable tablet Chew 1 tablet (81 mg total) by mouth daily. 10/14/17   Isaiah Serge, NP  atorvastatin (LIPITOR) 80 MG tablet Take 1 tablet (80 mg total) by mouth daily at 6 PM. 10/14/17   Isaiah Serge, NP  CALCIUM PO Take 1 tablet by mouth daily.    [provider]  Cholecalciferol (VITAMIN D3) 2000 units capsule Take 2,000 Units by mouth daily.     [provider]  clopidogrel (PLAVIX) 75 MG tablet Take 75 mg by mouth daily.    [provider]  DULoxetine (CYMBALTA) 60 MG capsule Take 60 mg by mouth daily.  12/05/16   [provider]  EMGALITY 120 MG/ML SOAJ Inject 120 mg into the skin every 30 (thirty) days.  07/30/17   [provider]  fludrocortisone (FLORINEF) 0.1 MG tablet Take 1 tablet (0.1 mg total) by mouth 2 (two) times daily. 10/26/17   Neva Seat, MD  HYDROcodone-acetaminophen (NORCO/VICODIN) 5-325 MG tablet Take 1 tablet by mouth every 6 (six) hours as needed for pain. 12/15/17   [provider]  hydrocortisone (CORTEF) 5 MG tablet Take 5-15 mg by mouth See admin instructions. Take 15 mg by mouth in the morning and 5 mg in the afternoon    [provider]  Insulin Aspart, w/Niacinamide, (FIASP FLEXTOUCH) 100 UNIT/ML SOPN Inject 2-14 Units into the skin See admin instructions. Use three times day  as needed before meals per sliding scale    [provider]  insulin degludec (TRESIBA FLEXTOUCH) 100 UNIT/ML SOPN FlexTouch Pen Inject 40 Units into the skin at bedtime.  08/28/16   [provider]  ipratropium-albuterol (DUONEB) 0.5-2.5 (3) MG/3ML SOLN Take 3 mLs by nebulization 4 (four) times daily.    [provider]  Iron-FA-B Cmp-C-Biot-Probiotic (FUSION PLUS) CAPS Take 1 capsule by mouth daily. 10/09/16   [provider]  levothyroxine (SYNTHROID, LEVOTHROID) 150 MCG tablet Take 150 mcg by mouth daily before breakfast.    [provider]  Magnesium 400 MG TABS Take 400 mg by mouth daily.     [provider]  metoprolol succinate (TOPROL-XL) 25 MG 24 hr tablet Take 0.5 tablets (12.5 mg total) by mouth daily. 10/27/17   Ledell Noss, MD  nitroGLYCERIN (NITROSTAT) 0.4 MG SL tablet Place 0.4 mg under the tongue every 5 (five) minutes as needed for chest pain.     [provider]  oxybutynin (DITROPAN XL) 15 MG 24 hr tablet Take 15 mg by mouth at bedtime.    [provider]  oxyCODONE (OXY IR/ROXICODONE) 5 MG immediate release tablet Take 5 mg by mouth every 4 (four) hours as needed for pain. 12/08/17   [provider]  polyethylene glycol (MIRALAX / GLYCOLAX) packet Take 17 g by mouth daily. 12/27/17   Masoudi, Elhamalsadat, MD  potassium chloride SA (K-DUR,KLOR-CON) 20 MEQ tablet Take 1 tablet (20 mEq total) by mouth daily. 03/14/17   Park Liter, MD  pramipexole (MIRAPEX) 1 MG tablet Take 1 mg by mouth 2 (two) times daily.    [provider]  promethazine (PHENERGAN) 12.5 MG tablet Take 12.5 mg by mouth every 6 (six) hours as needed for nausea. 12/12/17   [provider]  torsemide (DEMADEX) 20 MG tablet Take 2 tablets (40 mg total) by mouth 2 (two) times daily. 10/21/17   Alphonzo Grieve, MD  TRELEGY ELLIPTA 100-62.5-25 MCG/INH AEPB Inhale 1 puff into the lungs daily. 08/30/17   [provider]  TRULICITY 1.5 ER/7.4YC SOPN Inject 1.5 mg into the skin every Sunday.  10/10/16   [provider]    ALLERGIES:  Allergies  Allergen Reactions  . Hydroxychloroquine Shortness Of Breath, Nausea Only and Other (See Comments)    Dizziness (also)  . Donepezil Other (See Comments)    Dizziness, depression, and makes the patient feel "funny"  . Prednisone Other (See Comments)    Causes depression and suicidal thoughts  . Anticoagulant Cit Dext [Acd Formula A] Other (See Comments)    Unknown  . Bupropion Other (See Comments)    Suicidal thoughts  .  Metrizamide Other (See Comments)    (a non-ionic radiopaque contrast agent) "Blows the vein" and contrast gathers at the injected site's limb  . Varenicline Other (See Comments)    Suicidal thoughts  . Tape Rash and Other (See Comments)    Paper tape is preferred, PLEASE    SOCIAL HISTORY:  Social History   Tobacco Use  . Smoking status: Current Every Day Smoker    Packs/day: 0.75    Years: 44.00    Pack years: 33.00    Types: Cigarettes  . Smokeless tobacco: Never Used  Substance Use Topics  . Alcohol use: Yes    Alcohol/week: 7.0 standard drinks    Types: 7 Shots of liquor per week    FAMILY HISTORY: Family History  Problem Relation Age of Onset  . Stroke Mother   .  Diabetes Father   . Diabetes Sister   . Diabetes Sister   . Diabetes Son     EXAM: BP (!) 92/54   Pulse (!) 119   Temp (!) 101.2 F (38.4 C) (Rectal)   Resp (!) 24   Ht 5\' 2"  (1.575 m)   SpO2 100%   BMI 43.00 kg/m  CONSTITUTIONAL: She is awake and can blink her eyes but does not answer questions or follow commands.  She is in severe respiratory distress. HEAD: Normocephalic EYES: Conjunctivae clear, pupils appear equal, EOMI ENT: normal nose; moist mucous membranes NECK: Supple, no meningismus, no nuchal rigidity, no LAD  CARD: Regular and tachycardic; S1 and S2 appreciated; no murmurs, no clicks, no rubs, no gallops RESP: Diffuse expiratory wheezes, diminished aeration, no rhonchi or rales, respiratory distress, unable to answer questions, patient is not hypoxic on nonrebreather ABD/GI: Normal bowel sounds; non-distended; soft, non-tender, no rebound, no guarding, no peritoneal signs, no hepatosplenomegaly BACK:  The back appears normal and is non-tender to palpation, there is no CVA tenderness EXT: Normal ROM in all joints; non-tender to palpation; no edema; normal capillary refill; no cyanosis, no calf tenderness or swelling    SKIN: Normal color for age and race; warm; no rash NEURO: Opens her  eyes and will blink but does not answer questions or follow commands at this time.  MEDICAL DECISION MAKING: Patient here in severe respiratory distress.  Suspect COPD exacerbation versus CHF exacerbation versus pneumonia.  Given decline in her GCS, decision made to intubate patient upon arrival.  Will obtain labs, chest x-ray.  EKG shows sinus tachycardia.  She is intubated by Wyn Quaker, PA.  Please see her note for further details.  I was present for the entire procedure.  ED PROGRESS: Labs show respiratory acidosis.  She does appear to be compensating somewhat.  Rate on the ventilator is set to 24.  She does have a leukocytosis here of 12.9.  Troponin negative.  Lactate normal.  Rectal temp 102.1.  Will give broad-spectrum antibiotics.  Chest x-ray concerning for pulmonary edema.  Will give Lasix.  Will hold on IV fluids at this time given normal lactate, normal blood pressure when propofol is turned down.  I feel IV fluids could be harmful to her given she appears volume overloaded and has history of CHF.   She does have drop in her blood pressures with propofol.  Will change her to fentanyl and Versed.  3:18 AM Discussed patient's case with intensivist, Dr. Lucile Shutters.  I have recommended admission and patient (and family if present) agree with this plan. Admitting physician will place admission orders.   I reviewed all nursing notes, vitals, pertinent previous records, EKGs, lab and urine results, imaging (as available).     EKG Interpretation  Date/Time:  Thursday January 04 2018 00:56:45 EDT Ventricular Rate:  123 PR Interval:    QRS Duration: 91 QT Interval:  282 QTC Calculation: 404 R Axis:   88 Text Interpretation:  Sinus tachycardia Probable left atrial enlargement Borderline right axis deviation Low voltage, precordial leads Borderline T wave abnormalities Rate faster than previous Confirmed by Cheryn Lundquist, Cyril Mourning [phone]) on 01/04/2018 12:58:57 AM         CRITICAL  CARE Performed by: Cyril Mourning Conna Terada   Total critical care time: 55 minutes  Critical care time was exclusive of separately billable procedures and treating other patients.  Critical care was necessary to treat or prevent imminent or life-threatening deterioration.  Critical care was time spent personally by  me on the following activities: development of treatment plan with patient and/or surrogate as well as nursing, discussions with consultants, evaluation of patient's response to treatment, examination of patient, obtaining history from patient or surrogate, ordering and performing treatments and interventions, ordering and review of laboratory studies, ordering and review of radiographic studies, pulse oximetry and re-evaluation of patient's condition.    Lovie Agresta, Delice Bison, DO 01/04/18 [phone]

## 2018-01-04 NOTE — ED Notes (Signed)
Sample bad could not run chem 8. RN Nicki Reaper informed. Will attempt a redraw.

## 2018-01-04 NOTE — Progress Notes (Signed)
Patient was transported from the ED to room 2M03 without any apparent complications.

## 2018-01-04 NOTE — Progress Notes (Signed)
   Subjective/Chief Complaint: Intubated responsive   Objective: Vital signs in last 24 hours: Temp:  [98.1 F (36.7 C)-101.2 F (38.4 C)] 98.2 F (36.8 C) (10/10 1200) Pulse Rate:  [64-132] 73 (10/10 1200) Resp:  [17-30] 24 (10/10 1200) BP: (76-182)/(47-139) 93/55 (10/10 1200) SpO2:  [84 %-100 %] 100 % (10/10 1200) FiO2 (%):  [50 %-100 %] 50 % (10/10 1100) Weight:  [106.9 kg] 106.9 kg (10/10 0930) Last BM Date: 01/03/18  Intake/Output from previous day: 10/09 0701 - 10/10 0700 In: 746.6 [I.V.:46.6; IV Piggyback:700] Out: 40 [Urine:40] Intake/Output this shift: Total I/O In: 105 [I.V.:5; IV Piggyback:100] Out: 675 [Urine:275; Emesis/NG output:400]  GI soft, nontender wound healing by secondary intention, two shallow area on with pds suture knots no infection Lab Results:  Recent Labs    01/04/18 0055 01/04/18 0305  WBC 12.9*  --   HGB 11.4* 10.9*  HCT 39.3 32.0*  PLT 465*  --    BMET Recent Labs    01/03/18 0344 01/04/18 0055 01/04/18 0305  NA 140 139 139  K 4.1 3.9 3.9  CL 97* 95* 96*  CO2 38* 36*  --   GLUCOSE 108* 335* 246*  BUN 15 18 26*  CREATININE 1.05* 1.26* 1.30*  CALCIUM 8.5* 9.1  --    PT/INR No results for input(s): LABPROT, INR in the last 72 hours. ABG Recent Labs    01/04/18 0236 01/04/18 1109  PHART 7.317* 7.406  HCO3 38.3* 35.4*    Studies/Results: Dg Chest Portable 1 View  Result Date: 01/04/2018 CLINICAL DATA:  Intubation EXAM: PORTABLE CHEST 1 VIEW COMPARISON:  01/01/2018 chest radiograph FINDINGS: Endotracheal tube tip is 4 cm above the inferior margin of the carina. The orogastric tube courses beyond the field of view. Mild cardiomegaly is unchanged. Interstitial pulmonary edema has worsened from the prior study. No pneumothorax or pleural effusion. IMPRESSION: 1. Endotracheal tube tip 4 cm above the inferior margin of the carina. 2. Mild interstitial pulmonary edema has worsened from the prior study. Electronically Signed    By: Ulyses Jarred M.D.   On: 01/04/2018 01:48    Anti-infectives: Anti-infectives (From admission, onward)   Start     Dose/Rate Route Frequency Ordered Stop   01/04/18 0245  ceFEPIme (MAXIPIME) 2 g in sodium chloride 0.9 % 100 mL IVPB     2 g 200 mL/hr over 30 Minutes Intravenous  Once 01/04/18 0238 01/04/18 0429   01/04/18 0245  metroNIDAZOLE (FLAGYL) IVPB 500 mg     500 mg 100 mL/hr over 60 Minutes Intravenous Every 8 hours 01/04/18 0238     01/04/18 0245  vancomycin (VANCOCIN) IVPB 1000 mg/200 mL premix  Status:  Discontinued     1,000 mg 200 mL/hr over 60 Minutes Intravenous  Once 01/04/18 0238 01/04/18 0240   01/04/18 0245  vancomycin (VANCOCIN) 2,000 mg in sodium chloride 0.9 % 500 mL IVPB     2,000 mg 250 mL/hr over 120 Minutes Intravenous  Once 01/04/18 0241 01/04/18 0600      Assessment/Plan: S/p loa for sbo- Dr Coralie Keens at [place]  -care per ccm -wet to dry dressing changes bid -nothing more to do while here needs to keep f/u with Dr Lance Muss 01/04/2018

## 2018-01-04 NOTE — ED Notes (Signed)
CCM notified of BP -- will start Levophed--

## 2018-01-04 NOTE — Consult Note (Signed)
[facility] Nurse wound consult note Reason for Consult:abd full thickness wound non healing  Wound type:surgicalsurgical Pressure Injury POA: NA Measurement:two abd wounds, proximal wound 4cm x 4cm x 0.2cm filled with 100% hardened yellow exudate on top of yellow slough, epibole around all edges. Suture present on side of proximal wound, bedside RN to remove when dressing wound. Distal wound 2cm x 2cm x 0.2cm 10% yellow slough, 90% pink wound bed, wound edges have epibole Removed Unna Boots, will leave off while in hospital in bed on ventilator for fluid overload. Legs were swollen around boots. Legs elevated, heels floating. Right buttock has "pimple" like area questionably an ingrown hair. Head of bump fell off with cleaning, foam applied. Pt has multiple skin tears and scraps, has small dogs that have scratched her. Wound bed:see above Drainage (amount, consistency, odor)scant yellow exudate. Periwound:intact Dressing procedure/placement/frequency: I have provided nurses with orders for Santyl enzymatic ointment, topped with NS gauze to activate Santyl. Bedside nurse educated on need for saline on Santyl. Leave unna boots off for now until fluid level and respiratory improved, pt on ventilator prompted by fluid overload. Pt's nutritional level not optimal for wound healing, recommend Nutritional Consult, please order if you agree. We will not follow, but will remain available to this patient, to nursing, and the medical and/or surgical teams.  Please re-consult if we need to assist further.  Fara Olden, RN-C, WTA-C, [place] [facility] Associate

## 2018-01-04 NOTE — ED Provider Notes (Signed)
Procedure Name: Intubation Date/Time: 01/04/2018 2:36 AM Performed by: Lorin Glass, PA-C Pre-anesthesia Checklist: Patient identified, Patient being monitored, Emergency Drugs available, Timeout performed and Suction available Oxygen Delivery Method: Non-rebreather mask Preoxygenation: Pre-oxygenation with 100% oxygen Induction Type: Rapid sequence Ventilation: Mask ventilation without difficulty Laryngoscope Size: Glidescope Grade View: Grade II Tube size: 7.5 mm Number of attempts: 1 Placement Confirmation: ETT inserted through vocal cords under direct vision,  CO2 detector,  Breath sounds checked- equal and bilateral and Positive ETCO2 Secured at: 24 cm Tube secured with: ETT holder Dental Injury: Teeth and Oropharynx as per pre-operative assessment       Please see full note by Wyona Almas DO for full note   Lorin Glass, PA-C 01/04/18 0240

## 2018-01-04 NOTE — ED Notes (Signed)
ED Provider at bedside. 

## 2018-01-04 NOTE — Progress Notes (Signed)
CRITICAL VALUE ALERT  Critical Value:  Trop 0.10  Date & Time Notied:  4290 01/04/18  Provider Notified: Josephine Igo  Orders Received/Actions taken: no orders given

## 2018-01-04 NOTE — H&P (Signed)
NAME:  Crystal Yates, MRN:  [id number], DOB:  22-May-1955, LOS: 0 ADMISSION DATE:  01/04/2018, CONSULTATION DATE:  01/04/2018 REFERRING MD:  Dr. Leonides Schanz EDP, CHIEF COMPLAINT:  dyspnea   Brief History   62 year old female with multiple medical problems just discharged for CHF exacerbation. Now presenting with respiratory distress requiring intubation. Likely volume related.   Past Medical History  COPD, HFrEF, CKD, DM, SBO with recent surgery.   Significant Hospital Events   10/10 admit/ inutbated  Consults: date of consult/date signed off & final recs:    Procedures (surgical and bedside):  ETT 10/10 >  Significant Diagnostic Tests:    Micro Data:  Blood cx 10/10 > Sputum 10/10 >  Antimicrobials:  Cefepime 10/10 > Vanco 10/10  Subjective:  unable  Objective   Blood pressure (!) 92/58, pulse 70, temperature 98.4 F (36.9 C), resp. rate (!) 24, height 5\' 2"  (1.575 m), SpO2 100 %.    Vent Mode: PRVC FiO2 (%):  [60 %-100 %] 60 % Set Rate:  [24 bmp] 24 bmp Vt Set:  [400 mL] 400 mL PEEP:  [5 cmH20] 5 cmH20 Plateau Pressure:  [28 cmH20-30 cmH20] 28 cmH20   Intake/Output Summary (Last 24 hours) at 01/04/2018 0656 Last data filed at 01/04/2018 0600 Gross per 24 hour  Intake 746.64 ml  Output 40 ml  Net 706.64 ml   There were no vitals filed for this visit.  Examination: General: Morbidly obese female in NAD on vent.  HENT: [place]/AT, PERRL, no appreciable JVD Lungs: Clear on exam.  Cardiovascular: RRR, no MRG Abdomen: Soft, non-tender, non-distended Extremities: Marked lower extremity edema. Lower extremities with ACE wrap Neuro: Awake, comfortable on vent. Following commands   Resolved Hospital Problem list     Assessment & Plan:   Acute hypoxemic respiratory failure: likely due to heart failure. Was intubated more-so for airway protection as she was lethargic on presentation.  - full vent support - ABG - CXR - VAP bundle - She is more awake and  should be evaluated for extubation later on today.   Acute on chronic HFpEF: marked peripheral edema. Pulmonary edema on CXR. EF estimated 40% on LHC back in July, but subsequent echo with normal EF and grade 1 diastolic dysfunction.  - Aggressive IV diuresis, may need to add IV Bumex, worked better on last admission (last week) - Will likely need inpatient cardiology evaluation for long term CHF planning.  - Echo recent from July - Unfortunately having to hold metoprolol due to hypotension  COPD: doubt acute exacerbation - Scheduled nebs - Hold home trelegy - IV steroids for adrenal insufficiency  HCAP: less likely - Continue empiric antibiotis - Follow cultures - Check PCT - Low threshold to deescalate, DC antibiotics.   CKD - Follow BMP  CAD - Continue aspirin, plavix  Recent SBO - Recently seen by [facility] nurse and specific recommendations made, will ask them to see again.   DM - CBG monitoring and SSI  Adrenal insufficiency - stress dose hydrocortisone - Continue home florinef  Hypothyroid - Continue home synthroid  Disposition / Summary of Today's Plan 01/04/18   Admit to ICU on vent. Diurese. Treat for possible PNA, though doubt.     Diet: NPO Pain/Anxiety/Delirium protocol (if indicated): Fentanyl/versed VAP protocol (if indicated): yes DVT prophylaxis: heparin GI prophylaxis: Pepcid Hyperglycemia protocol: SSI Mobility: Bedrest Code Status: Full Family Communication: Husband  Labs   CBC: Recent Labs  Lab 12/29/17 0603 12/30/17 7408 12/31/17 1448 01/01/18 1856  01/04/18 0055 01/04/18 0305  WBC 8.0 8.9 8.9 8.5 12.9*  --   NEUTROABS  --   --   --   --  6.3  --   HGB 10.3* 9.9* 10.2* 9.8* 11.4* 10.9*  HCT 33.4* 32.4* 33.3* 33.1* 39.3 32.0*  MCV 93.8 94.2 94.1 94.6 96.1  --   PLT 263 278 318 293 465*  --     Basic Metabolic Panel: Recent Labs  Lab 12/30/17 0607 12/31/17 0826 01/01/18 0513 01/02/18 0655 01/03/18 0344 01/04/18 0055  01/04/18 0305  NA 141 139 139 141 140 139 139  K 3.6 3.5 4.3 3.1* 4.1 3.9 3.9  CL 97* 97* 99 97* 97* 95* 96*  CO2 35* 35* 35* 36* 38* 36*  --   GLUCOSE 126* 78 95 78 108* 335* 246*  BUN 17 15 18 13 15 18  26*  CREATININE 1.02* 0.95 0.92 0.99 1.05* 1.26* 1.30*  CALCIUM 8.8* 9.0 8.9 8.6* 8.5* 9.1  --   MG 1.8 2.0 1.9 1.5* 1.8  --   --    GFR: Estimated Creatinine Clearance: 51.5 mL/min (A) (by C-G formula based on SCr of 1.3 mg/dL (H)). Recent Labs  Lab 12/30/17 0607 12/31/17 0826 01/01/18 0513 01/04/18 0055 01/04/18 0306  WBC 8.9 8.9 8.5 12.9*  --   LATICACIDVEN  --   --   --   --  1.37    Liver Function Tests: Recent Labs  Lab 01/04/18 0055  AST 190*  ALT 90*  ALKPHOS 223*  BILITOT 0.5  PROT 6.8  ALBUMIN 2.8*   No results for input(s): LIPASE, AMYLASE in the last 168 hours. No results for input(s): AMMONIA in the last 168 hours.  ABG    Component Value Date/Time   PHART 7.317 (L) 01/04/2018 0236   PCO2ART 74.9 (HH) 01/04/2018 0236   PO2ART 318.0 (H) 01/04/2018 0236   HCO3 38.3 (H) 01/04/2018 0236   TCO2 38 (H) 01/04/2018 0305   O2SAT 100.0 01/04/2018 0236     Coagulation Profile: No results for input(s): INR, PROTIME in the last 168 hours.  Cardiac Enzymes: No results for input(s): CKTOTAL, CKMB, CKMBINDEX, TROPONINI in the last 168 hours.  HbA1C: Hgb A1c MFr Bld  Date/Time Value Ref Range Status  09/16/2017 10:13 PM 10.5 (H) 4.8 - 5.6 % Final    Comment:    (NOTE) Pre diabetes:          5.7%-6.4% Diabetes:              >6.4% Glycemic control for   <7.0% adults with diabetes   10/13/2016 12:15 AM 6.1 (H) 4.8 - 5.6 % Final    Comment:    (NOTE)         Pre-diabetes: 5.7 - 6.4         Diabetes: >6.4         Glycemic control for adults with diabetes: <7.0     CBG: Recent Labs  Lab 01/02/18 1137 01/02/18 1618 01/02/18 2106 01/03/18 0744 01/03/18 1130  GLUCAP 153* 216* 199* 194* 196*    Admitting History of Present Illness.   Patient  is encephalopathic and/or intubated. Therefore history has been obtained from chart review and family interview.  62 year old female with past medical history as below, which is significant for COPD, heart failure with reduced ejection fraction, coronary artery disease status post PCI, chronic kidney disease stage II, diabetes, adrenal insufficiency, and hypothyroidism.  She requires home oxygen for COPD and heart failure.  She also  recently underwent an abdominal surgery for small bowel obstruction secondary to adhesions, and has had a longitudinal abdominal wound which is been slow to heal.  More recently, she was admitted to [facility] from October 2 to October 9 with hypoxemic respiratory failure secondary to acute exacerbation of congestive heart failure.  Her weight at the time of admission was 10 kg higher than her reported dry weight.  She was treated with aggressive diuresis with IV Lasix which was eventually switched to IV Bumex with good response.  She never made it back to her dry weight but her symptoms were markedly improved prompting her discharge.  Then on October 10 in the early morning hours she awoke from sleep and attempted to ambulate to the restroom, but developed profound dyspnea.  Upon arrival to the emergency department she was essentially unresponsive and required intubation for airway protection.  Chest x-ray concerning for pulmonary edema.  She was given a dose of IV Lasix with little urinary output response.  Laboratory evaluation concerning for leukocytosis 12.9 prompting her to be started on broad-spectrum antibiotics. PCCM asked to admit.   Review of Systems:   unable  Past Medical History  She,  has a past medical history of Acute on chronic systolic heart failure exacerbation(HCC) (04/08/2016), Arthritis, CAD in native artery, Chronic combined systolic and diastolic CHF (congestive heart failure) ([place]), CKD (chronic kidney disease), stage II, COPD (chronic  obstructive pulmonary disease) ([place]), Diabetes mellitus without complication ([place]), Dyspnea, Hashimoto's thyroiditis, Hyperlipidemia, Hypertension, Myocardial infarction ([place]), On home oxygen therapy, Secondary adrenal insufficiency ([place]), Thyroid disease, and Tobacco abuse.   Surgical History    Past Surgical History:  Procedure Laterality Date  . ABDOMINAL SURGERY    . CESAREAN SECTION    . CHOLECYSTECTOMY    . COLON RESECTION    . CORONARY BALLOON ANGIOPLASTY N/A 10/13/2017   Procedure: CORONARY BALLOON ANGIOPLASTY;  Surgeon: Belva Crome, MD;  Location: [place] [facility];  Service: Cardiovascular;  Laterality: N/A;  . HERNIA MESH REMOVAL    . HERNIA REPAIR    . LEFT HEART CATH AND CORONARY ANGIOGRAPHY N/A 10/13/2016   Procedure: Left Heart Cath and Coronary Angiography;  Surgeon: Nelva Bush, MD;  Location: [place] [facility];  Service: Cardiovascular;  Laterality: N/A;  . RIGHT/LEFT HEART CATH AND CORONARY ANGIOGRAPHY N/A 10/13/2017   Procedure: RIGHT/LEFT HEART CATH AND CORONARY ANGIOGRAPHY;  Surgeon: Belva Crome, MD;  Location: [place] [facility];  Service: Cardiovascular;  Laterality: N/A;  . SHOULDER ARTHROSCOPY    . TUBAL LIGATION       Social History   Social History   Socioeconomic History  . Marital status: Married    Spouse name: Not on file  . Number of children: Not on file  . Years of education: Not on file  . Highest education level: Not on file  Occupational History  . Not on file  Social Needs  . Financial resource strain: Not on file  . Food insecurity:    Worry: Not on file    Inability: Not on file  . Transportation needs:    Medical: Not on file    Non-medical: Not on file  Tobacco Use  . Smoking status: Current Every Day Smoker    Packs/day: 0.75    Years: 44.00    Pack years: 33.00    Types: Cigarettes  . Smokeless tobacco: Never Used  Substance and Sexual Activity  . Alcohol use: Yes    Alcohol/week: 7.0 standard  drinks    Types:  7 Shots of liquor per week  . Drug use: Yes    Types: Marijuana    Comment: 10/23/2017 "had 1 ~ 1 month ago; nothing before or since"  . Sexual activity: Yes  Lifestyle  . Physical activity:    Days per week: Not on file    Minutes per session: Not on file  . Stress: Not on file  Relationships  . Social connections:    Talks on phone: Not on file    Gets together: Not on file    Attends religious service: Not on file    Active member of club or organization: Not on file    Attends meetings of clubs or organizations: Not on file    Relationship status: Not on file  . Intimate partner violence:    Fear of current or ex partner: Not on file    Emotionally abused: Not on file    Physically abused: Not on file    Forced sexual activity: Not on file  Other Topics Concern  . Not on file  Social History Narrative  . Not on file  ,  reports that she has been smoking cigarettes. She has a 33.00 pack-year smoking history. She has never used smokeless tobacco. She reports that she drinks about 7.0 standard drinks of alcohol per week. She reports that she has current or past drug history. Drug: Marijuana.   Family History   Her family history includes Diabetes in her father, sister, sister, and son; Stroke in her mother.   Allergies Allergies  Allergen Reactions  . Hydroxychloroquine Shortness Of Breath, Nausea Only and Other (See Comments)    Dizziness (also)  . Donepezil Other (See Comments)    Dizziness, depression, and makes the patient feel "funny"  . Prednisone Other (See Comments)    Causes depression and suicidal thoughts  . Anticoagulant Cit Dext [Acd Formula A] Other (See Comments)    Unknown  . Bupropion Other (See Comments)    Suicidal thoughts  . Metrizamide Other (See Comments)    (a non-ionic radiopaque contrast agent) "Blows the vein" and contrast gathers at the injected site's limb  . Varenicline Other (See Comments)    Suicidal thoughts  . Tape Rash and Other  (See Comments)    Paper tape is preferred, PLEASE     Home Medications  Prior to Admission medications   Medication Sig Start Date End Date Taking? Authorizing Provider  acetaminophen (TYLENOL) 325 MG tablet Take 2 tablets (650 mg total) by mouth every 4 (four) hours as needed for headache or mild pain. 10/14/17   Isaiah Serge, NP  albuterol [facility] HFA) 108 (90 Base) MCG/ACT inhaler Inhale 2 puffs into the lungs See admin instructions. Inhale 2 puffs into the lungs every 4-6 hours as needed for shortness of breath or wheezing 07/24/17   [provider]  ALPRAZolam Duanne Moron) 0.25 MG tablet Take 1 tablet (0.25 mg total) by mouth 3 (three) times daily as needed for anxiety (Panic attack). 01/03/18   Ina Homes, MD  aspirin 81 MG chewable tablet Chew 1 tablet (81 mg total) by mouth daily. 10/14/17   Isaiah Serge, NP  atorvastatin (LIPITOR) 80 MG tablet Take 1 tablet (80 mg total) by mouth daily at 6 PM. 10/14/17   Isaiah Serge, NP  CALCIUM PO Take 1 tablet by mouth daily.    [provider]  Cholecalciferol (VITAMIN D3) 2000 units capsule Take 2,000 Units by mouth daily.  [provider]  clopidogrel (PLAVIX) 75 MG tablet Take 75 mg by mouth daily.    [provider]  DULoxetine (CYMBALTA) 60 MG capsule Take 60 mg by mouth daily.  12/05/16   [provider]  EMGALITY 120 MG/ML SOAJ Inject 120 mg into the skin every 30 (thirty) days.  07/30/17   [provider]  fludrocortisone (FLORINEF) 0.1 MG tablet Take 1 tablet (0.1 mg total) by mouth 2 (two) times daily. 10/26/17   Neva Seat, MD  HYDROcodone-acetaminophen (NORCO/VICODIN) 5-325 MG tablet Take 1 tablet by mouth every 6 (six) hours as needed for pain. 12/15/17   [provider]  hydrocortisone (CORTEF) 5 MG tablet Take 5-15 mg by mouth See admin instructions. Take 15 mg by mouth in the morning and 5 mg in the afternoon    [provider]  Insulin Aspart,  w/Niacinamide, (FIASP FLEXTOUCH) 100 UNIT/ML SOPN Inject 2-14 Units into the skin See admin instructions. Use three times day as needed before meals per sliding scale    [provider]  insulin degludec (TRESIBA FLEXTOUCH) 100 UNIT/ML SOPN FlexTouch Pen Inject 40 Units into the skin at bedtime.  08/28/16   [provider]  ipratropium-albuterol (DUONEB) 0.5-2.5 (3) MG/3ML SOLN Take 3 mLs by nebulization 4 (four) times daily.    [provider]  Iron-FA-B Cmp-C-Biot-Probiotic (FUSION PLUS) CAPS Take 1 capsule by mouth daily. 10/09/16   [provider]  levothyroxine (SYNTHROID, LEVOTHROID) 150 MCG tablet Take 150 mcg by mouth daily before breakfast.    [provider]  Magnesium 400 MG TABS Take 400 mg by mouth daily.     [provider]  metoprolol succinate (TOPROL-XL) 25 MG 24 hr tablet Take 0.5 tablets (12.5 mg total) by mouth daily. 10/27/17   Ledell Noss, MD  nitroGLYCERIN (NITROSTAT) 0.4 MG SL tablet Place 0.4 mg under the tongue every 5 (five) minutes as needed for chest pain.     [provider]  oxybutynin (DITROPAN XL) 15 MG 24 hr tablet Take 15 mg by mouth at bedtime.    [provider]  oxyCODONE (OXY IR/ROXICODONE) 5 MG immediate release tablet Take 5 mg by mouth every 4 (four) hours as needed for pain. 12/08/17   [provider]  polyethylene glycol (MIRALAX / GLYCOLAX) packet Take 17 g by mouth daily. 12/27/17   Masoudi, Elhamalsadat, MD  potassium chloride SA (K-DUR,KLOR-CON) 20 MEQ tablet Take 1 tablet (20 mEq total) by mouth daily. 03/14/17   Park Liter, MD  pramipexole (MIRAPEX) 1 MG tablet Take 1 mg by mouth 2 (two) times daily.    [provider]  promethazine (PHENERGAN) 12.5 MG tablet Take 12.5 mg by mouth every 6 (six) hours as needed for nausea. 12/12/17   [provider]  torsemide (DEMADEX) 20 MG tablet Take 2 tablets (40 mg total) by mouth 2 (two) times daily. 10/21/17    Alphonzo Grieve, MD  TRELEGY ELLIPTA 100-62.5-25 MCG/INH AEPB Inhale 1 puff into the lungs daily. 08/30/17   [provider]  TRULICITY 1.5 QB/1.6XI SOPN Inject 1.5 mg into the skin every Sunday.  10/10/16   [provider]      Georgann Housekeeper, AGACNP-BC [place] Pager [phone] or [phone]  01/04/2018 6:56 AM

## 2018-01-04 NOTE — ED Triage Notes (Signed)
Pt arrives to ED from home with complaints of shortness of breath. EMS reports pt was at home tonight, used her walker to walk to the bathroom and became short of breath. Pt did two nebulizer tx at home and also took xanax for anexity. Ems gave pt duoneb in route. Pt came in on nonrebreather. Initial sats at home in 80s. 96% on NRB.

## 2018-01-04 NOTE — Progress Notes (Signed)
Initial Nutrition Assessment  DOCUMENTATION CODES:   Morbid obesity  INTERVENTION:    Vital High Protein at 60 ml/h (1440 ml per day)  Provides 1440 kcal, 126 gm protein, 1204 ml free water daily  NUTRITION DIAGNOSIS:   Inadequate oral intake related to inability to eat as evidenced by NPO status.  GOAL:   Provide needs based on ASPEN/SCCM guidelines  MONITOR:   Vent status, TF tolerance, Skin, I & O's  REASON FOR ASSESSMENT:   Ventilator, Consult Enteral/tube feeding initiation and management  ASSESSMENT:   62 yo female with PMH of HTN, DM, HLD, thyroid disease, CAD, CKD, adrenal insufficiency, CHF, COPD, on home oxygen who was admitted on 10/10 with respiratory distress requiring intubation.  Recent admission for CHF exacerbation. Just discharged 10/9. She was eating very well, consuming 100% of meals during last admission.   Recent surgery for SBO. Surgical wound is healing well per Surgery note today.   Received consult for assessment of nutrition status. Clarified consult reason with CCM resident; RD to order and manage TF.  Patient is currently intubated on ventilator support MV: 9 L/min Temp (24hrs), Avg:98.7 F (37.1 C), Min:98.1 F (36.7 C), Max:101.2 F (38.4 C)   Labs reviewed. CBG's: 137-176 Medications reviewed and include Lasix, Solu-cortef, Novolog.  Weight has been trending up per review of weight encounters.  NUTRITION - FOCUSED PHYSICAL EXAM:    Most Recent Value  Orbital Region  No depletion  Upper Arm Region  Unable to assess  Thoracic and Lumbar Region  No depletion  Buccal Region  No depletion  Temple Region  No depletion  Clavicle Bone Region  No depletion  Clavicle and Acromion Bone Region  No depletion  Scapular Bone Region  No depletion  Dorsal Hand  No depletion  Patellar Region  No depletion  Anterior Thigh Region  No depletion  Posterior Calf Region  No depletion  Edema (RD Assessment)  Moderate  Hair  Reviewed  Eyes   Unable to assess  Mouth  Unable to assess  Skin  Reviewed  Nails  Reviewed       Diet Order:   Diet Order            Diet NPO time specified  Diet effective now              EDUCATION NEEDS:   No education needs have been identified at this time  Skin:  Skin Assessment: Skin Integrity Issues: Skin Integrity Issues:: Stage II, Incisions Stage II: R buttocks Incisions: recent abdominal surgery--healing well per surgery notes  Last BM:  none documented this admission  Height:   Ht Readings from Last 1 Encounters:  01/04/18 5\' 2"  (1.575 m)    Weight:   Wt Readings from Last 1 Encounters:  01/04/18 106.9 kg    Ideal Body Weight:  50 kg  BMI:  Body mass index is 43.1 kg/m.  Estimated Nutritional Needs:   Kcal:  1175-1500  Protein:  125 gm  Fluid:  1.5-1.8 L    Molli Barrows, RD, LDN, CNSC Pager [phone] After Hours Pager [phone]

## 2018-01-04 NOTE — Sedation Documentation (Signed)
Color change noted. + breath sound bilaterally per MD

## 2018-01-05 ENCOUNTER — Inpatient Hospital Stay (HOSPITAL_COMMUNITY): Payer: Federal, State, Local not specified - PPO

## 2018-01-05 DIAGNOSIS — R0902 Hypoxemia: Secondary | ICD-10-CM | POA: Diagnosis not present

## 2018-01-05 DIAGNOSIS — J9602 Acute respiratory failure with hypercapnia: Secondary | ICD-10-CM | POA: Diagnosis not present

## 2018-01-05 DIAGNOSIS — I5042 Chronic combined systolic (congestive) and diastolic (congestive) heart failure: Secondary | ICD-10-CM | POA: Diagnosis not present

## 2018-01-05 DIAGNOSIS — J9601 Acute respiratory failure with hypoxia: Secondary | ICD-10-CM | POA: Diagnosis not present

## 2018-01-05 LAB — CBC
HCT: 29 % — ABNORMAL LOW (ref 36.0–46.0)
Hemoglobin: 8.9 g/dL — ABNORMAL LOW (ref 12.0–15.0)
MCH: 27.6 pg (ref 26.0–34.0)
MCHC: 30.7 g/dL (ref 30.0–36.0)
MCV: 89.8 fL (ref 80.0–100.0)
NRBC: 0 % (ref 0.0–0.2)
PLATELETS: 294 10*3/uL (ref 150–400)
RBC: 3.23 MIL/uL — AB (ref 3.87–5.11)
RDW: 15.6 % — AB (ref 11.5–15.5)
WBC: 9.2 10*3/uL (ref 4.0–10.5)

## 2018-01-05 LAB — BASIC METABOLIC PANEL
ANION GAP: 10 (ref 5–15)
BUN: 26 mg/dL — ABNORMAL HIGH (ref 8–23)
CALCIUM: 8.8 mg/dL — AB (ref 8.9–10.3)
CO2: 31 mmol/L (ref 22–32)
Chloride: 98 mmol/L (ref 98–111)
Creatinine, Ser: 1.28 mg/dL — ABNORMAL HIGH (ref 0.44–1.00)
GFR calc Af Amer: 51 mL/min — ABNORMAL LOW (ref >60)
GFR, EST NON AFRICAN AMERICAN: 44 mL/min — AB (ref >60)
Glucose, Bld: 321 mg/dL — ABNORMAL HIGH (ref 70–99)
Potassium: 3.4 mmol/L — ABNORMAL LOW (ref 3.5–5.1)
SODIUM: 139 mmol/L (ref 135–145)

## 2018-01-05 LAB — GLUCOSE, CAPILLARY
GLUCOSE-CAPILLARY: 329 mg/dL — AB (ref 70–99)
GLUCOSE-CAPILLARY: 267 mg/dL — AB (ref 70–99)
GLUCOSE-CAPILLARY: 236 mg/dL — AB (ref 70–99)
Glucose-Capillary: 302 mg/dL — ABNORMAL HIGH (ref 70–99)
Glucose-Capillary: 177 mg/dL — ABNORMAL HIGH (ref 70–99)
Glucose-Capillary: 194 mg/dL — ABNORMAL HIGH (ref 70–99)
Glucose-Capillary: 209 mg/dL — ABNORMAL HIGH (ref 70–99)

## 2018-01-05 LAB — PHOSPHORUS: PHOSPHORUS: 3 mg/dL (ref 2.5–4.6)

## 2018-01-05 LAB — MAGNESIUM: MAGNESIUM: 1.5 mg/dL — AB (ref 1.7–2.4)

## 2018-01-05 LAB — URINE CULTURE: CULTURE: NO GROWTH

## 2018-01-05 MED ORDER — HYDROCORTISONE NA SUCCINATE PF 100 MG IJ SOLR: 50.0000 mg | Freq: Two times a day (BID) | INTRAMUSCULAR | Status: DC

## 2018-01-05 MED ORDER — INSULIN GLARGINE 100 UNIT/ML ~~LOC~~ SOLN
20.0000 [IU] | Freq: Every day | SUBCUTANEOUS | Status: DC
  Administered 2018-01-05 – 2018-01-17 (×13): 20 [IU] via SUBCUTANEOUS
  Filled 2018-01-05 (×13): qty 0.2

## 2018-01-05 MED ORDER — POTASSIUM CHLORIDE CRYS ER 20 MEQ PO TBCR
40.0000 meq | EXTENDED_RELEASE_TABLET | Freq: Once | ORAL | Status: AC
  Administered 2018-01-05: 40 meq via ORAL
  Filled 2018-01-05: qty 2

## 2018-01-05 MED ORDER — OXYCODONE-ACETAMINOPHEN 5-325 MG PO TABS
1.0000 | ORAL_TABLET | ORAL | Status: DC | PRN
  Administered 2018-01-05 – 2018-01-17 (×11): 1
  Filled 2018-01-05 (×11): qty 1

## 2018-01-05 MED ORDER — HYDROCORTISONE NA SUCCINATE PF 100 MG IJ SOLR
50.0000 mg | Freq: Every day | INTRAMUSCULAR | Status: DC
  Administered 2018-01-05: 50 mg via INTRAVENOUS
  Filled 2018-01-05: qty 2

## 2018-01-05 MED ORDER — INSULIN ASPART 100 UNIT/ML ~~LOC~~ SOLN
2.0000 [IU] | Freq: Once | SUBCUTANEOUS | Status: AC
  Administered 2018-01-05: 2 [IU] via SUBCUTANEOUS

## 2018-01-05 MED ORDER — ORAL CARE MOUTH RINSE
15.0000 mL | Freq: Two times a day (BID) | OROMUCOSAL | Status: DC
  Administered 2018-01-06 – 2018-01-17 (×20): 15 mL via OROMUCOSAL

## 2018-01-05 MED ORDER — MAGNESIUM SULFATE 2 GM/50ML IV SOLN
2.0000 g | Freq: Once | INTRAVENOUS | Status: AC
  Administered 2018-01-05: 2 g via INTRAVENOUS
  Filled 2018-01-05: qty 50

## 2018-01-05 MED ORDER — FUROSEMIDE 10 MG/ML IJ SOLN
40.0000 mg | Freq: Two times a day (BID) | INTRAMUSCULAR | Status: AC
  Administered 2018-01-05 (×2): 40 mg via INTRAVENOUS
  Filled 2018-01-05 (×2): qty 4

## 2018-01-05 NOTE — Progress Notes (Signed)
Follow up - Critical Care Medicine Note  Patient Details:    62 year old female with multiple medical problems just discharged for CHF exacerbation. Who presented with respiratory distress requiring intubation.   Lines, Airways, Drains: Airway 7.5 mm (Active)  Secured at (cm) 23 cm 01/05/2018  3:38 AM  Measured From Lips 01/05/2018  3:38 AM  [place] 01/05/2018  3:38 AM  Secured By [employer] 01/05/2018  3:38 AM  Tube Holder Repositioned Yes 01/05/2018  3:38 AM  Cuff Pressure (cm H2O) 26 cm H2O 01/04/2018  7:24 PM  Site Condition Dry 01/05/2018 12:00 AM     NG/OG Tube Orogastric 16 Fr. Center mouth Xray;Aucultation Documented cm marking at nare/ corner of mouth 7 cm (Active)  Site Assessment Clean;Dry;Intact 01/04/2018  8:00 PM  Ongoing Placement Verification No change in cm markings or external length of tube from initial placement;No change in respiratory status;No acute changes, not attributed to clinical condition 01/04/2018  8:00 PM  Status Infusing tube feed 01/04/2018  8:00 PM  Drainage Appearance [place] 01/04/2018  1:23 AM     Urethral Catheter Freida Busman, RN Straight-tip;Temperature probe 14 Fr. (Active)  Indication for Insertion or Continuance of Catheter Unstable critical patients (first 24-48 hours) 01/04/2018  8:00 PM  Site Assessment Clean;Intact;Dry 01/04/2018  8:00 PM  Catheter Maintenance Bag below level of bladder;Catheter secured;Drainage bag/tubing not touching floor;No dependent loops;Seal intact 01/04/2018  8:00 PM  Collection Container Standard drainage bag 01/04/2018  8:00 PM  Securement Method Other (Comment) 01/04/2018  8:00 PM  Urinary Catheter Interventions Unclamped 01/04/2018  4:00 PM  Output (mL) 60 mL 01/05/2018  6:00 AM    Anti-infectives:  Anti-infectives (From admission, onward)   Start     Dose/Rate Route Frequency Ordered Stop   01/04/18 0245  ceFEPIme (MAXIPIME) 2 g in sodium chloride 0.9 % 100 mL IVPB     2 g 200 mL/hr  over 30 Minutes Intravenous  Once 01/04/18 0238 01/04/18 0429   01/04/18 0245  metroNIDAZOLE (FLAGYL) IVPB 500 mg  Status:  Discontinued     500 mg 100 mL/hr over 60 Minutes Intravenous Every 8 hours 01/04/18 0238 01/04/18 1545   01/04/18 0245  vancomycin (VANCOCIN) IVPB 1000 mg/200 mL premix  Status:  Discontinued     1,000 mg 200 mL/hr over 60 Minutes Intravenous  Once 01/04/18 0238 01/04/18 0240   01/04/18 0245  vancomycin (VANCOCIN) 2,000 mg in sodium chloride 0.9 % 500 mL IVPB     2,000 mg 250 mL/hr over 120 Minutes Intravenous  Once 01/04/18 0241 01/04/18 0600      Microbiology: Results for orders placed or performed during the hospital encounter of 01/04/18  MRSA PCR Screening     Status: None   Collection Time: 01/04/18  9:07 AM  Result Value Ref Range Status   MRSA by PCR NEGATIVE NEGATIVE Final    Comment:        The GeneXpert MRSA Assay (FDA approved for NASAL specimens only), is one component of a comprehensive MRSA colonization surveillance program. It is not intended to diagnose MRSA infection nor to guide or monitor treatment for MRSA infections. Performed at [facility], [place] [address] [place], [place] [address]   Culture, respiratory (tracheal aspirate)     Status: None (Preliminary result)   Collection Time: 01/04/18 11:33 AM  Result Value Ref Range Status   Specimen Description TRACHEAL ASPIRATE  Final   Special Requests NONE  Final   Gram Stain  Final    RARE WBC PRESENT, PREDOMINANTLY PMN FEW GRAM POSITIVE COCCI FEW GRAM POSITIVE RODS Performed at [facility], [place] [address], [place], [place] [address]    Culture PENDING  Incomplete   Report Status PENDING  Incomplete    Best Practice/Protocols:  VTE Prophylaxis: Heparin (SQ) GI Prophylaxis: Proton Pump Inhibitor Hyperglycemia (ICU)  Events: 10/10 Admitted to ICU  Studies: CXR 10/11 - LLL consolidation: atilectasis vs. PNA. Small left pleural  effusion  Consults: CCM General Surgery   Subjective:    Overnight Issues:  No acute events overnight. CBGs high in 200-300s.   Objective:  Vital signs for last 24 hours: Temp:  [97.9 F (36.6 C)-98.6 F (37 C)] 98.2 F (36.8 C) (10/11 0708) Pulse Rate:  [64-104] 96 (10/11 0708) Resp:  [14-30] 24 (10/11 0708) BP: (76-117)/(47-77) 115/61 (10/11 0708) SpO2:  [96 %-100 %] 100 % (10/11 0708) FiO2 (%):  [40 %-50 %] 40 % (10/11 0338) Weight:  [106.6 kg-106.9 kg] 106.6 kg (10/11 0500)  Hemodynamic parameters for last 24 hours:    Intake/Output from previous day: 10/10 0701 - 10/11 0700 In: 1060.6 [I.V.:280.6; NG/GT:580; IV Piggyback:200] Out: 1815 [Urine:1415; Emesis/NG output:400]  Intake/Output this shift: No intake/output data recorded.  Vent settings for last 24 hours: Vent Mode: PRVC FiO2 (%):  [40 %-50 %] 40 % Set Rate:  [24 bmp] 24 bmp Vt Set:  [400 mL] 400 mL PEEP:  [5 cmH20] 5 cmH20 Plateau Pressure:  [17 [id number] cmH20] 18 cmH20  Physical Exam:  General: alert and no respiratory distress Neuro: alert, oriented and nonfocal exam HEENT/Neck: no JVD and ETT WNL  Resp: diminished breath sounds LLL CVS: regular rate and rhythm, S1, S2 normal, no murmur, click, rub or gallop GI: healing abdominal wound, dressed Skin: no rash Extremities: edema 1+  Assessment/Plan:   NEURO  Altered Mental Status:  sedation. Low requirements of fentanyl   Plan:  - RASS goal 1, 0  - Fentanyl gtt, Versed - wean Fentanyl to lowest does necessar-   PULM  Acute on Chronic Respiratory Failure not due to CHF  Pneumonia less likely   Plan:  - Has had decreased FiO2/PEEP requirements - attempt SBT, possible extubation today - cont Vent support - decrease frequency of stress dose steroids to IV hydrocortisone 50 mg q12h  CARDIO  Congestive Heart Failure (diastolic)  Elevated troponin, likely demand ischemia. Trending down   Plan:  - IV lasis 80 mg BID - telemetry  RENAL   Actue Renal Failure (due to hypovolemia/decreased circulating volume and CHF)   Plan:  - Good UOP - trend Cr w/ BMET  GI  No acute issue   Plan:  - tube feeds while intubated - PPI prophyalxis  ID  Afebrile 24 hr. WBC stable. Was febrile on admission, with elevated procalcitonin. CXR has LLL consolidation for possible PNA.  Overall improving clinically.    Plan:  - Monitor for signs of infxn, especially PNA - follow cutlures - low threshold to initiate abx  HEME  Acute Anemia likely from volume overload in setting of Acute CHF exacerbation   Plan:  - follow CBC  ENDO Hypothyroidism (hypothalamic)  Diabetes Mellitisus Type II   Plan:  - cont home synthroid - SSIr - add levamir 20 units and 2 units tube feed covreage  Global Issues   Improved Respiratory status. Likely will be able to extubate.  Hemodynamically stable w/o pressors.    LOS: 1 day   Marny Lowenstein, MD, MS  FAMILY MEDICINE RESIDENT - PGY2  [place] 01/05/2018

## 2018-01-05 NOTE — Procedures (Signed)
Extubation Procedure Note  Patient Details:   Name: Crystal Yates DOB: November 17, 1955 MRN: [id number]   Airway Documentation:    Vent end date: 01/05/18 Vent end time: 1002   Evaluation  O2 sats: stable throughout Complications: No apparent complications Patient did tolerate procedure well. Bilateral Breath Sounds: Clear, Diminished   Yes   RT extubated patient to 3L [place] per MD order with RN at bedside. MD requested T-Bar trail before extubation for 10-15 minutes RT completed with no complications. Patient had a positive cuff leak. No stridor noted. Patient is sating 98% on 3L [place].   Vernona Rieger 01/05/2018, 10:11 AM

## 2018-01-06 ENCOUNTER — Inpatient Hospital Stay (HOSPITAL_COMMUNITY): Payer: Federal, State, Local not specified - PPO

## 2018-01-06 ENCOUNTER — Encounter (HOSPITAL_COMMUNITY): Payer: Self-pay

## 2018-01-06 DIAGNOSIS — J9601 Acute respiratory failure with hypoxia: Secondary | ICD-10-CM | POA: Diagnosis not present

## 2018-01-06 DIAGNOSIS — J984 Other disorders of lung: Secondary | ICD-10-CM | POA: Diagnosis not present

## 2018-01-06 LAB — BASIC METABOLIC PANEL
Anion gap: 6 (ref 5–15)
BUN: 33 mg/dL — AB (ref 8–23)
CALCIUM: 9.2 mg/dL (ref 8.9–10.3)
CO2: 36 mmol/L — ABNORMAL HIGH (ref 22–32)
CREATININE: 1.18 mg/dL — AB (ref 0.44–1.00)
Chloride: 99 mmol/L (ref 98–111)
GFR calc Af Amer: 56 mL/min — ABNORMAL LOW (ref >60)
GFR, EST NON AFRICAN AMERICAN: 48 mL/min — AB (ref >60)
Glucose, Bld: 129 mg/dL — ABNORMAL HIGH (ref 70–99)
POTASSIUM: 3 mmol/L — AB (ref 3.5–5.1)
SODIUM: 141 mmol/L (ref 135–145)

## 2018-01-06 LAB — CBC
HCT: 31.2 % — ABNORMAL LOW (ref 36.0–46.0)
Hemoglobin: 9.6 g/dL — ABNORMAL LOW (ref 12.0–15.0)
MCH: 27.7 pg (ref 26.0–34.0)
MCHC: 30.8 g/dL (ref 30.0–36.0)
MCV: 90.2 fL (ref 80.0–100.0)
PLATELETS: 322 10*3/uL (ref 150–400)
RBC: 3.46 MIL/uL — AB (ref 3.87–5.11)
RDW: 15.7 % — AB (ref 11.5–15.5)
WBC: 12.2 10*3/uL — ABNORMAL HIGH (ref 4.0–10.5)
nRBC: 0 % (ref 0.0–0.2)

## 2018-01-06 LAB — GLUCOSE, CAPILLARY
GLUCOSE-CAPILLARY: 166 mg/dL — AB (ref 70–99)
GLUCOSE-CAPILLARY: 96 mg/dL (ref 70–99)
GLUCOSE-CAPILLARY: 205 mg/dL — AB (ref 70–99)
GLUCOSE-CAPILLARY: 164 mg/dL — AB (ref 70–99)
Glucose-Capillary: 172 mg/dL — ABNORMAL HIGH (ref 70–99)

## 2018-01-06 LAB — CULTURE, RESPIRATORY W GRAM STAIN: Culture: NORMAL

## 2018-01-06 LAB — CULTURE, RESPIRATORY

## 2018-01-06 MED ORDER — FUROSEMIDE 10 MG/ML IJ SOLN
40.0000 mg | Freq: Two times a day (BID) | INTRAMUSCULAR | Status: AC
  Administered 2018-01-06 (×2): 40 mg via INTRAVENOUS
  Filled 2018-01-06 (×2): qty 4

## 2018-01-06 MED ORDER — HYDROCORTISONE 5 MG PO TABS
15.0000 mg | ORAL_TABLET | Freq: Every morning | ORAL | Status: DC
  Administered 2018-01-07 – 2018-01-17 (×11): 15 mg via ORAL
  Filled 2018-01-06 (×13): qty 1

## 2018-01-06 MED ORDER — INSULIN ASPART 100 UNIT/ML ~~LOC~~ SOLN
0.0000 [IU] | Freq: Three times a day (TID) | SUBCUTANEOUS | Status: DC
  Administered 2018-01-06: 3 [IU] via SUBCUTANEOUS
  Administered 2018-01-06 – 2018-01-07 (×3): 5 [IU] via SUBCUTANEOUS
  Administered 2018-01-08: 3 [IU] via SUBCUTANEOUS
  Administered 2018-01-08: 8 [IU] via SUBCUTANEOUS
  Administered 2018-01-08: 5 [IU] via SUBCUTANEOUS
  Administered 2018-01-09: 3 [IU] via SUBCUTANEOUS
  Administered 2018-01-09 – 2018-01-10 (×3): 2 [IU] via SUBCUTANEOUS
  Administered 2018-01-11: 3 [IU] via SUBCUTANEOUS
  Administered 2018-01-12: 5 [IU] via SUBCUTANEOUS
  Administered 2018-01-12: 8 [IU] via SUBCUTANEOUS
  Administered 2018-01-13 – 2018-01-16 (×7): 3 [IU] via SUBCUTANEOUS
  Administered 2018-01-16: 2 [IU] via SUBCUTANEOUS
  Administered 2018-01-17: 5 [IU] via SUBCUTANEOUS
  Administered 2018-01-17: 2 [IU] via SUBCUTANEOUS

## 2018-01-06 MED ORDER — POTASSIUM CHLORIDE CRYS ER 20 MEQ PO TBCR
40.0000 meq | EXTENDED_RELEASE_TABLET | Freq: Once | ORAL | Status: AC
  Administered 2018-01-06: 40 meq via ORAL
  Filled 2018-01-06: qty 2

## 2018-01-06 MED ORDER — METOPROLOL SUCCINATE ER 25 MG PO TB24
12.5000 mg | ORAL_TABLET | Freq: Every day | ORAL | Status: DC
  Administered 2018-01-06 – 2018-01-17 (×12): 12.5 mg via ORAL
  Filled 2018-01-06 (×13): qty 1

## 2018-01-06 MED ORDER — HYDROCORTISONE 5 MG PO TABS
5.0000 mg | ORAL_TABLET | Freq: Every evening | ORAL | Status: DC
  Administered 2018-01-06 – 2018-01-16 (×11): 5 mg via ORAL
  Filled 2018-01-06 (×13): qty 1

## 2018-01-06 NOTE — Care Management Note (Addendum)
Case Management Note  Patient Details  Name: Crystal Yates MRN: [id number] Date of Birth: [birth year]  Subjective/Objective:  Patient lives at home with spouse; PCP Dortha Kern, [place]; has private insurance with BCBS with prescription drug coverage; she is active with [facility] as prior to admission for [facility], nurses aide; will need to add HHPT to these services.  Will need to fax in to [facility].  10/16 [name] RN, BSN - RN to check ambulatory sats, pt to see patient today also.                 Action/Plan: NCM will follow for transition of care needs.   Expected Discharge Date:                  Expected Discharge Plan:  [place]  In-House Referral:     Discharge planning Services  CM Consult  Post Acute Care Choice:  Resumption of Svcs/PTA Provider Choice offered to:     DME Arranged:    DME Agency:     HH Arranged:  RN, Nurse's Aide [place] Agency:  [place]  Status of Service:  In process, will continue to follow  If discussed at Long Length of Stay Meetings, dates discussed:    Additional Comments:  [name], RN 01/06/2018, 12:37 PM

## 2018-01-06 NOTE — Evaluation (Signed)
Physical Therapy Evaluation Patient Details Name: Crystal Yates MRN: [id number] DOB: 11-Mar-1956 Today's Date: 01/06/2018   History of Present Illness  Pt adm with recurrent acute respiratory failure and acute on chronic heart failure. Intubated 10/10-10/11. PMH - chf, cad, dm, htn, obesity, copd, ckd, laparotomy for SBO  Clinical Impression  Pt admitted with above diagnosis and presents to PT with functional limitations due to deficits listed below (See PT problem list). Pt needs skilled PT to maximize independence and safety to allow discharge to home with intermittent support of family and HHPT. Pt motivated to maximize independence. Instructed pt to amb in the hallways with family or staff as well as with PT.     Follow Up Recommendations Home health PT;Supervision - Intermittent    Equipment Recommendations  None recommended by PT    Recommendations for Other Services       Precautions / Restrictions Precautions Precautions: None Restrictions Weight Bearing Restrictions: No      Mobility  Bed Mobility Overal bed mobility: Needs Assistance Bed Mobility: Supine to Sit     Supine to sit: Min assist     General bed mobility comments: Assist to bring legs off of bed and elevate trunk into sitting  Transfers Overall transfer level: Needs assistance Equipment used: 4-wheeled walker Transfers: Sit to/from Stand Sit to Stand: Min guard         General transfer comment: Assist for safety  Ambulation/Gait Ambulation/Gait assistance: Supervision Gait Distance (Feet): 120 Feet Assistive device: 4-wheeled walker Gait Pattern/deviations: Step-through pattern;Decreased stride length Gait velocity: decr Gait velocity interpretation: 1.31 - 2.62 ft/sec, indicative of limited community ambulator General Gait Details: Tolerated amb using 2L of O2. Dyspnea 2/4. Good use of rollator  Stairs            Wheelchair Mobility    Modified Rankin (Stroke  Patients Only)       Balance Overall balance assessment: Mild deficits observed, not formally tested                                           Pertinent Vitals/Pain Pain Assessment: No/denies pain    Home Living Family/patient expects to be discharged to:: Private residence Living Arrangements: Spouse/significant other Available Help at Discharge: Family;Available PRN/intermittently Type of Home: House Home Access: Stairs to enter Entrance Stairs-Rails: Left Entrance Stairs-Number of Steps: 4 Home Layout: One level Home Equipment: Clinical cytogeneticist - 4 wheels;Grab bars - tub/shower;Grab bars - toilet;Cane - single point      Prior Function Level of Independence: Needs assistance   Gait / Transfers Assistance Needed: modified independent with rollator and O2           Hand Dominance   Dominant Hand: Left    Extremity/Trunk Assessment   Upper Extremity Assessment Upper Extremity Assessment: Generalized weakness    Lower Extremity Assessment Lower Extremity Assessment: Generalized weakness       Communication   Communication: No difficulties  Cognition Arousal/Alertness: Awake/alert Behavior During Therapy: WFL for tasks assessed/performed Overall Cognitive Status: Within Functional Limits for tasks assessed                                        General Comments      Exercises     Assessment/Plan    PT Assessment  Patient needs continued PT services  PT Problem List Decreased strength;Decreased activity tolerance;Decreased balance;Decreased mobility       PT Treatment Interventions DME instruction;Gait training;Stair training;Functional mobility training;Therapeutic activities;Therapeutic exercise;Balance training;Patient/family education    PT Goals (Current goals can be found in the Care Plan section)  Acute Rehab PT Goals Patient Stated Goal: return home PT Goal Formulation: With patient/family Time For Goal  Achievement: 01/20/18 Potential to Achieve Goals: Good    Frequency Min 3X/week   Barriers to discharge Decreased caregiver support husband works    Co-evaluation               AM-PAC PT "6 Clicks" Daily Activity  Outcome Measure Difficulty turning over in bed (including adjusting bedclothes, sheets and blankets)?: A Little Difficulty moving from lying on back to sitting on the side of the bed? : Unable Difficulty sitting down on and standing up from a chair with arms (e.g., wheelchair, bedside commode, etc,.)?: Unable Help needed moving to and from a bed to chair (including a wheelchair)?: A Little Help needed walking in hospital room?: A Little Help needed climbing 3-5 steps with a railing? : A Little 6 Click Score: 14    End of Session Equipment Utilized During Treatment: Gait belt;Oxygen Activity Tolerance: Patient tolerated treatment well Patient left: in chair;with call bell/phone within reach;with family/visitor present Nurse Communication: Mobility status PT Visit Diagnosis: Other abnormalities of gait and mobility (R26.89)    Time: 1771-1657 PT Time Calculation (min) (ACUTE ONLY): 32 min   Charges:   PT Evaluation $PT Eval Moderate Complexity: 1 Mod PT Treatments $Gait Training: 8-22 mins        [place] Pager [phone] Office [place] 01/06/2018, 12:04 PM

## 2018-01-06 NOTE — Progress Notes (Addendum)
NAME:  Crystal Yates, MRN:  [id number], DOB:  1955-12-08, LOS: 2 ADMISSION DATE:  01/04/2018, CONSULTATION DATE:  01/04/2018 REFERRING MD:  Dr. Leonides Schanz EDP, CHIEF COMPLAINT:  dyspnea   Brief History   62 year old female with multiple medical problems just discharged for CHF exacerbation. Now presenting with respiratory distress requiring intubation. Likely volume related.   Past Medical History  COPD, HFrEF, CKD, DM, SBO with recent surgery.   Significant Hospital Events   10/10 admit/ inutbated-10/11 extubated   Consults: date of consult/date signed off & final recs:    Procedures (surgical and bedside):  ETT 10/10 >10/11  Significant Diagnostic Tests:    Micro Data:  Blood cx 10/10 > Sputum 10/10 > 10/10 Urine >>neg   Antimicrobials:  Cefepime 10/10 >10/11 Vanco 10/10>>10/11  Subjective:  Improved, extubated 10/11, feels much better O2 demands improved, sitting up in bed talking , husband at bedside   Objective   Blood pressure (!) 141/102, pulse 84, temperature 97.8 F (36.6 C), temperature source Oral, resp. rate (!) 22, height 5\' 2"  (1.575 m), weight 107.7 kg, SpO2 95 %.    FiO2 (%):  [40 %] 40 %   Intake/Output Summary (Last 24 hours) at 01/06/2018 0848 Last data filed at 01/06/2018 0600 Gross per 24 hour  Intake 162.45 ml  Output 1965 ml  Net -1802.55 ml   Filed Weights   01/04/18 0930 01/05/18 0500 01/06/18 0500  Weight: 106.9 kg 106.6 kg 107.7 kg    Examination: General: Morbidly obese female in no acute distress on oxygen  HENT: [place]/AT, PERRLA, no JVD  Lungs: Clear to auscultation Cardiovascular: RRR, no MRG  Abdomen: Soft, nontender, mid abdominal dressing intact clean and dry bowel sounds positive  Extremities: 1-2+ edema bilaterally  Neuro: Awake and alert x3 command no deficits noted    Resolved Hospital Problem list     Assessment & Plan:   Acute on chronic hypoxemic respiratory failure: likely due to heart failure.  Improved  extubated 1011.  Back to baseline oxygen at 2 L   Plan  Continue on O2 at 2lm Add IS    Acute on chronic HFpEF: marked peripheral edema. Pulmonary edema on CXR. EF estimated 40% on LHC back in July, but subsequent echo with normal EF and grade 1 diastolic dysfunction.  - Aggressive IV diuresis, with lasix  - Will likely need inpatient cardiology evaluation for long term CHF planning.  - Echo recent from July - restart metoprolol as b/p is improved  -check labs today   COPD: doubt acute exacerbation - Scheduled nebs - Hold home trelegy - continue on home florinef  HCAP: less likely - ABX off 10/11  - Follow cultures   CKD - Follow BMP  CAD - Continue aspirin, plavix  Recent SBO - Recently seen by [facility] nurse and specific recommendations made   DM - CBG monitoring and SSI  Adrenal insufficiency - - Continue home florinef  Hypothyroid - Continue home synthroid  Transaminitis   Plan  Tr LFT   Disposition / Summary of Today's Plan 01/06/18   Improved after extubation , transfer to tele , and to [facility]     Diet: Heart healthy  Pain/Anxiety/Delirium protocol (if indicated):na  VAP protocol (if indicated): na  DVT prophylaxis: heparin GI prophylaxis: Pepcid Hyperglycemia protocol: SSI Mobility: Act as tolerated Code Status: Full Family Communication: Husband  Labs   CBC: Recent Labs  Lab 12/31/17 0826 01/01/18 0513 01/04/18 0055 01/04/18 0305 01/05/18 0301  WBC 8.9  8.5 12.9*  --  9.2  NEUTROABS  --   --  6.3  --   --   HGB 10.2* 9.8* 11.4* 10.9* 8.9*  HCT 33.3* 33.1* 39.3 32.0* 29.0*  MCV 94.1 94.6 96.1  --  89.8  PLT 318 293 465*  --  220    Basic Metabolic Panel: Recent Labs  Lab 12/31/17 0826 01/01/18 0513 01/02/18 0655 01/03/18 0344 01/04/18 0055 01/04/18 0305 01/05/18 0301  NA 139 139 141 140 139 139 139  K 3.5 4.3 3.1* 4.1 3.9 3.9 3.4*  CL 97* 99 97* 97* 95* 96* 98  CO2 35* 35* 36* 38* 36*  --  31  GLUCOSE 78 95 78 108* 335* 246*  321*  BUN 15 18 13 15 18  26* 26*  CREATININE 0.95 0.92 0.99 1.05* 1.26* 1.30* 1.28*  CALCIUM 9.0 8.9 8.6* 8.5* 9.1  --  8.8*  MG 2.0 1.9 1.5* 1.8  --   --  1.5*  PHOS  --   --   --   --   --   --  3.0   GFR: Estimated Creatinine Clearance: 52.6 mL/min (A) (by C-G formula based on SCr of 1.28 mg/dL (H)). Recent Labs  Lab 12/31/17 0826 01/01/18 0513 01/04/18 0055 01/04/18 0306 01/04/18 0923 01/05/18 0301  PROCALCITON  --   --   --   --  0.62  --   WBC 8.9 8.5 12.9*  --   --  9.2  LATICACIDVEN  --   --   --  1.37  --   --     Liver Function Tests: Recent Labs  Lab 01/04/18 0055  AST 190*  ALT 90*  ALKPHOS 223*  BILITOT 0.5  PROT 6.8  ALBUMIN 2.8*   No results for input(s): LIPASE, AMYLASE in the last 168 hours. No results for input(s): AMMONIA in the last 168 hours.  ABG    Component Value Date/Time   PHART 7.406 01/04/2018 1109   PCO2ART 57.5 (H) 01/04/2018 1109   PO2ART 82.7 (L) 01/04/2018 1109   HCO3 35.4 (H) 01/04/2018 1109   TCO2 38 (H) 01/04/2018 0305   O2SAT 96.0 01/04/2018 1109     Coagulation Profile: No results for input(s): INR, PROTIME in the last 168 hours.  Cardiac Enzymes: Recent Labs  Lab 01/04/18 0923 01/04/18 1306 01/04/18 1957  TROPONINI 0.10* 0.09* 0.06*    HbA1C: Hgb A1c MFr Bld  Date/Time Value Ref Range Status  09/16/2017 10:13 PM 10.5 (H) 4.8 - 5.6 % Final    Comment:    (NOTE) Pre diabetes:          5.7%-6.4% Diabetes:              >6.4% Glycemic control for   <7.0% adults with diabetes   10/13/2016 12:15 AM 6.1 (H) 4.8 - 5.6 % Final    Comment:    (NOTE)         Pre-diabetes: 5.7 - 6.4         Diabetes: >6.4         Glycemic control for adults with diabetes: <7.0     CBG: Recent Labs  Lab 01/05/18 1758 01/05/18 1914 01/05/18 2320 01/06/18 0327 01/06/18 0703  GLUCAP 209* 267* 236* 166* 96    Admitting History of Present Illness.   Patient is encephalopathic and/or intubated. Therefore history has been  obtained from chart review and family interview.  62 year old female with past medical history as below, which is significant for COPD,  heart failure with reduced ejection fraction, coronary artery disease status post PCI, chronic kidney disease stage II, diabetes, adrenal insufficiency, and hypothyroidism.  She requires home oxygen for COPD and heart failure.  She also recently underwent an abdominal surgery for small bowel obstruction secondary to adhesions, and has had a longitudinal abdominal wound which is been slow to heal.  More recently, she was admitted to [facility] from October 2 to October 9 with hypoxemic respiratory failure secondary to acute exacerbation of congestive heart failure.  Her weight at the time of admission was 10 kg higher than her reported dry weight.  She was treated with aggressive diuresis with IV Lasix which was eventually switched to IV Bumex with good response.  She never made it back to her dry weight but her symptoms were markedly improved prompting her discharge.  Then on October 10 in the early morning hours she awoke from sleep and attempted to ambulate to the restroom, but developed profound dyspnea.  Upon arrival to the emergency department she was essentially unresponsive and required intubation for airway protection.  Chest x-ray concerning for pulmonary edema.  She was given a dose of IV Lasix with little urinary output response.  Laboratory evaluation concerning for leukocytosis 12.9 prompting her to be started on broad-spectrum antibiotics. PCCM asked to admit.      [facility]  [phone]   01/06/2018 8:48 AM

## 2018-01-07 ENCOUNTER — Inpatient Hospital Stay (HOSPITAL_COMMUNITY): Payer: Federal, State, Local not specified - PPO

## 2018-01-07 DIAGNOSIS — I509 Heart failure, unspecified: Secondary | ICD-10-CM | POA: Diagnosis not present

## 2018-01-07 DIAGNOSIS — J9602 Acute respiratory failure with hypercapnia: Secondary | ICD-10-CM | POA: Diagnosis not present

## 2018-01-07 DIAGNOSIS — I5042 Chronic combined systolic (congestive) and diastolic (congestive) heart failure: Secondary | ICD-10-CM

## 2018-01-07 DIAGNOSIS — J9601 Acute respiratory failure with hypoxia: Secondary | ICD-10-CM | POA: Diagnosis not present

## 2018-01-07 LAB — COMPREHENSIVE METABOLIC PANEL
ALBUMIN: 2.1 g/dL — AB (ref 3.5–5.0)
ALK PHOS: 100 U/L (ref 38–126)
ALT: 26 U/L (ref 0–44)
AST: 18 U/L (ref 15–41)
Anion gap: 9 (ref 5–15)
BILIRUBIN TOTAL: 0.5 mg/dL (ref 0.3–1.2)
BUN: 26 mg/dL — AB (ref 8–23)
CALCIUM: 8.9 mg/dL (ref 8.9–10.3)
CO2: 34 mmol/L — AB (ref 22–32)
CREATININE: 1.2 mg/dL — AB (ref 0.44–1.00)
Chloride: 98 mmol/L (ref 98–111)
GFR calc Af Amer: 55 mL/min — ABNORMAL LOW (ref >60)
GFR calc non Af Amer: 47 mL/min — ABNORMAL LOW (ref >60)
GLUCOSE: 117 mg/dL — AB (ref 70–99)
Potassium: 3.4 mmol/L — ABNORMAL LOW (ref 3.5–5.1)
SODIUM: 141 mmol/L (ref 135–145)
Total Protein: 5.7 g/dL — ABNORMAL LOW (ref 6.5–8.1)

## 2018-01-07 LAB — CBC
HCT: 30.4 % — ABNORMAL LOW (ref 36.0–46.0)
Hemoglobin: 9.2 g/dL — ABNORMAL LOW (ref 12.0–15.0)
MCH: 27.7 pg (ref 26.0–34.0)
MCHC: 30.3 g/dL (ref 30.0–36.0)
MCV: 91.6 fL (ref 80.0–100.0)
Platelets: 307 10*3/uL (ref 150–400)
RBC: 3.32 MIL/uL — ABNORMAL LOW (ref 3.87–5.11)
RDW: 15.7 % — AB (ref 11.5–15.5)
WBC: 10.3 10*3/uL (ref 4.0–10.5)
nRBC: 0 % (ref 0.0–0.2)

## 2018-01-07 LAB — GLUCOSE, CAPILLARY
GLUCOSE-CAPILLARY: 85 mg/dL (ref 70–99)
GLUCOSE-CAPILLARY: 232 mg/dL — AB (ref 70–99)
GLUCOSE-CAPILLARY: 227 mg/dL — AB (ref 70–99)
Glucose-Capillary: 323 mg/dL — ABNORMAL HIGH (ref 70–99)

## 2018-01-07 MED ORDER — DULOXETINE HCL 60 MG PO CPEP
60.0000 mg | ORAL_CAPSULE | Freq: Every day | ORAL | Status: DC
  Administered 2018-01-07 – 2018-01-17 (×11): 60 mg via ORAL
  Filled 2018-01-07 (×11): qty 1

## 2018-01-07 MED ORDER — IPRATROPIUM-ALBUTEROL 0.5-2.5 (3) MG/3ML IN SOLN
3.0000 mL | Freq: Three times a day (TID) | RESPIRATORY_TRACT | Status: DC
  Administered 2018-01-07 – 2018-01-09 (×7): 3 mL via RESPIRATORY_TRACT
  Filled 2018-01-07 (×7): qty 3

## 2018-01-07 MED ORDER — POTASSIUM CHLORIDE CRYS ER 20 MEQ PO TBCR
40.0000 meq | EXTENDED_RELEASE_TABLET | Freq: Once | ORAL | Status: AC
  Administered 2018-01-07: 40 meq via ORAL
  Filled 2018-01-07: qty 2

## 2018-01-07 MED ORDER — FUROSEMIDE 10 MG/ML IJ SOLN
40.0000 mg | Freq: Two times a day (BID) | INTRAMUSCULAR | Status: AC
  Administered 2018-01-07 (×2): 40 mg via INTRAVENOUS
  Filled 2018-01-07 (×2): qty 4

## 2018-01-07 NOTE — Progress Notes (Signed)
   Subjective: Patient is doing well this AM but continues to express concerns of insomnia and anxiety. She is having good diureses with the current regimen although her weight is unchanged. She is ambulating and tolerating PO intake. Discussed the plan to continue diureses and encouraged her to continue to ambulate. All questions and concerns addressed.   Objective: Vital signs in last 24 hours: Vitals:   01/06/18 2006 01/06/18 2304 01/07/18 0742 01/07/18 0748  BP:  (!) 111/56  (!) 141/125  Pulse:  83  89  Resp:  16  (!) 22  Temp:  98.5 F (36.9 C)  98.1 F (36.7 C)  TempSrc:  Oral  Oral  SpO2: 92% 97% 96% 100%  Weight:      Height:       General: Obese female in no acute distress Pulm: Distant breath sounds, faint bibasilar crackles  CV: RRR, no murmurs, no rubs  Abdomen: Soft, non-distended, minimal tenderness to palpation   Extremities: No LE edema but does have hip and sacral edema   Assessment/Plan:  Ms. Ken is a 62 y.o female with COPD, HFrEF, CKD, DM, and adrenal insufficiency who presented to the ED with acute on chronic hypercarbic hypoxic respiratory failure. She was subsequently intubated for airway protection and aggressively diuresed in the ICU. Further management as outlined below.   Acute on Chronic Hypercarbic Hypoxic Respiratory Failure  - 2/2 to anxiety vs hypervolemia  - Extubated >24 hours, having some hoarseness  - Remains 2L/min [place] which is her home oxygen requirement  - Diureses with IV furosemide 40 mg BID  - Continue Plumicort and duonebs  - Continue to ambulate patient   HFrEF CAD s/p PCI 10/13/17  - Continue to monitor volume status, appears improved but continues to have sacral and hip edema  - Continue diureses with IV furosemide 40 mg BID  - Continue metoprolol 12.5 mg QD - Continue ASA and plavix  - Continue atorvastatin   Adrenal Insufficiency  - Originally received stress dose steroids  - Continue Hydrocortisone 15 mg QAM and 5 mg  QPM - Continue Florinef 0.1 mg QD  DM  - CBGs at goal of <180  - Continue Lantus 20 units QD and SSI  CKD Stage III - Renal function stable  - Monitor closely in the setting of active diureses   Anxiety  - Continue Duloxetine  - Discontinue Xanax on admission   Dispo: Anticipated discharge in approximately 2-3 day(s).   Ina Homes, MD 01/07/2018, 8:46 AM

## 2018-01-08 DIAGNOSIS — E1122 Type 2 diabetes mellitus with diabetic chronic kidney disease: Secondary | ICD-10-CM

## 2018-01-08 DIAGNOSIS — I130 Hypertensive heart and chronic kidney disease with heart failure and stage 1 through stage 4 chronic kidney disease, or unspecified chronic kidney disease: Secondary | ICD-10-CM

## 2018-01-08 DIAGNOSIS — J9622 Acute and chronic respiratory failure with hypercapnia: Secondary | ICD-10-CM | POA: Diagnosis not present

## 2018-01-08 DIAGNOSIS — Z7951 Long term (current) use of inhaled steroids: Secondary | ICD-10-CM

## 2018-01-08 DIAGNOSIS — E8809 Other disorders of plasma-protein metabolism, not elsewhere classified: Secondary | ICD-10-CM

## 2018-01-08 DIAGNOSIS — N189 Chronic kidney disease, unspecified: Secondary | ICD-10-CM

## 2018-01-08 DIAGNOSIS — I5042 Chronic combined systolic (congestive) and diastolic (congestive) heart failure: Secondary | ICD-10-CM | POA: Diagnosis not present

## 2018-01-08 DIAGNOSIS — J9621 Acute and chronic respiratory failure with hypoxia: Secondary | ICD-10-CM | POA: Diagnosis not present

## 2018-01-08 DIAGNOSIS — Z7952 Long term (current) use of systemic steroids: Secondary | ICD-10-CM

## 2018-01-08 LAB — RENAL FUNCTION PANEL
ALBUMIN: 2.1 g/dL — AB (ref 3.5–5.0)
Anion gap: 6 (ref 5–15)
BUN: 24 mg/dL — AB (ref 8–23)
CO2: 34 mmol/L — ABNORMAL HIGH (ref 22–32)
CREATININE: 1.02 mg/dL — AB (ref 0.44–1.00)
Calcium: 8.6 mg/dL — ABNORMAL LOW (ref 8.9–10.3)
Chloride: 99 mmol/L (ref 98–111)
GFR calc Af Amer: >60 mL/min (ref >60)
GFR, EST NON AFRICAN AMERICAN: 58 mL/min — AB (ref >60)
GLUCOSE: 271 mg/dL — AB (ref 70–99)
PHOSPHORUS: 3.5 mg/dL (ref 2.5–4.6)
POTASSIUM: 3.5 mmol/L (ref 3.5–5.1)
SODIUM: 139 mmol/L (ref 135–145)

## 2018-01-08 LAB — GLUCOSE, CAPILLARY
GLUCOSE-CAPILLARY: 258 mg/dL — AB (ref 70–99)
GLUCOSE-CAPILLARY: 236 mg/dL — AB (ref 70–99)
Glucose-Capillary: 173 mg/dL — ABNORMAL HIGH (ref 70–99)
Glucose-Capillary: 200 mg/dL — ABNORMAL HIGH (ref 70–99)

## 2018-01-08 LAB — PROTEIN / CREATININE RATIO, URINE: CREATININE, URINE: 19.89 mg/dL

## 2018-01-08 LAB — MAGNESIUM: Magnesium: 1.6 mg/dL — ABNORMAL LOW (ref 1.7–2.4)

## 2018-01-08 MED ORDER — MAGNESIUM SULFATE 4 GM/100ML IV SOLN
4.0000 g | Freq: Once | INTRAVENOUS | Status: DC
  Filled 2018-01-08: qty 100

## 2018-01-08 MED ORDER — POTASSIUM CHLORIDE CRYS ER 20 MEQ PO TBCR
40.0000 meq | EXTENDED_RELEASE_TABLET | Freq: Once | ORAL | Status: AC
  Administered 2018-01-08: 40 meq via ORAL
  Filled 2018-01-08: qty 2

## 2018-01-08 MED ORDER — TORSEMIDE 20 MG PO TABS
40.0000 mg | ORAL_TABLET | Freq: Two times a day (BID) | ORAL | Status: DC
  Administered 2018-01-08 – 2018-01-11 (×7): 40 mg via ORAL
  Filled 2018-01-08 (×7): qty 2

## 2018-01-08 MED ORDER — INSULIN ASPART 100 UNIT/ML ~~LOC~~ SOLN
5.0000 [IU] | Freq: Three times a day (TID) | SUBCUTANEOUS | Status: DC
  Administered 2018-01-08 – 2018-01-12 (×13): 5 [IU] via SUBCUTANEOUS

## 2018-01-08 NOTE — Progress Notes (Addendum)
Subjective: Crystal Yates is doing better today. Mentions that she had one episode of SOB/panic attack this AM when sitting in the bad. She says that, with help of nurse and trying to breath slowly, she got it under control. Denies chest pain. No other complaints.  Objective:  Vital signs in last 24 hours: Vitals:   01/08/18 0723 01/08/18 0838 01/08/18 1342 01/08/18 1349  BP:  (!) 122/59    Pulse:  82 76 75  Resp:   17 17  Temp:  97.9 F (36.6 C)    TempSrc:  Oral    SpO2: 95% 99% 100%   Weight:      Height:      Physical Exam  Constitutional: She is oriented to person, place, and time and well-developed, well-nourished, and in no distress.  Cardiovascular: Normal rate, regular rhythm and normal heart sounds.  No murmur heard. Pulmonary/Chest: Effort normal. No respiratory distress on 3 li nasal O2. She has wheezes. She has bibasilar rales. And chonchi Abdominal: Soft. Bowel sounds are normal. There is no tenderness.  Musculoskeletal: She exhibits 3+ bilateral edema.  Neurological: She is alert and oriented to person, place, and time.  Psychiatric: Mood, affect and judgment normal.    Assessment/Plan:  Principal Problem:   Acute hypoxemic respiratory failure (HCC) Active Problems:   Adrenal insufficiency (HCC)   Type 2 diabetes mellitus without complication, with long-term current use of insulin (HCC)   Chronic combined systolic and diastolic CHF (congestive heart failure) ([place]) Crystal Yates is a 62 y.o female with COPD, HFrEF, CKD, DM, and adrenal insufficiency who presented to the ED with acute on chronic hypercarbic hypoxic respiratory failure. She was subsequently intubated for airway protection and aggressively diuresed in the ICU. Acute metabolic encephalopathy: Due to hypercapnia. Resolved.  SOB, Acute hypercapnic respiratory failure (Improved):  Currently extubate. Symptoms improved. O2 sat >92 with 3 li of nasal O2 at rest. Symptoms occurred 1 day after  discharge. Etiology of episodic shortness of breath and respiratory failure on this admission is unclear, Likely in setting of chronic CHF, COPD, morbid obesity and with probable hypoinflation with recent abdominal surgery, benzodiazepin use (? Only 2 Aplrazolam) and anxiety/panic attack.  -DC Cardiac monitoring -Respiratory therapy  CHF:  Last Echo on 10/19/2017: LVEF: 42-70%, grade 1 diastolic dysfunction Does not seem to be with acute exacerbation on this admission, particularly as the patient was stable and discharge a day before admission, no symptoms of acute ischemia that might have exacerbated it .  Has the chronic LE edema and faint bibasilar crackle despite IV diuretics on recent admissions. (Latter can be 2/2 atelectatics)  -Strict I/O -Weight daily -Switch IV Lasix to home dose of Torsemide (40 mg BID) today   Hypoalbuminemia:  Alb :2.1 Can also have a role in her persistent LE edema   -Urine Pr/Cr ratio -24 h urine Pr -Liver function test  CAD: No chest pain. Stable  -c/w ASA, Plavix, Lipitor,   Adrenal insufficiency: Received strees dose of steroid when arrived with respiratory failure -C/w home dose of Florinef and Hydrocortisine (Florinef 0.1 mg BID, Hydrocortisone 15 mg in AM and 5 mg in the evening)   Hypothyroidism: Last TSH: 2.2 three months ago. -Continue synthroid 150 mcg QD  DM: CBG today:  Ref Range & Units 11:54 07:52 1d ago  Glucose-Capillary 70 - 99 mg/dL 258High   173High   323High     -Start with 5 unit of Novolog 3 times daily with meal -C/w Moderate SSI and  Lantus 20 mg QD -Hypoglycemia protocol for CBG,70  HTN -c/w Metoprolol succinate 12.5 mg QD  COPD on chronic oxygen therapy. Currently on 3 li nasal O2 at hospital. -c/w Duoneb nebulizer 3 times daily -c/w Pulmicort  -Keep O2 % at least 92 %  Panic attack and anxiety: Patient had an episode of panic attack this AM but got it under control with working on her breathing patern  -Held  Alprazolam on this admission due to respiratory failure -C/w Cymbalta 60 mg QD   Back wound: improved -C/w Wound care  Code: Full Diet: Heart healthy, carb modified VTE ppx:  Hep IV fluid: 10 cc/h  Dispo: Anticipated discharge in approximately 2-3 days if remains stable and symptoms improvement.Patient has Hx of multiple recent admission and still high risk for re admission. Will keep her in hospital for next few days to optimize home medication dose to prevent or decrease rate of readmission.   Dewayne Hatch, MD 01/08/2018, 2:14 PM Pager: [phone]

## 2018-01-08 NOTE — Progress Notes (Signed)
Inpatient Diabetes Program Recommendations  AACE/ADA: New Consensus Statement on Inpatient Glycemic Control (2015)  Target Ranges:  Prepandial:   less than 140 mg/dL      Peak postprandial:   less than 180 mg/dL (1-2 hours)      Critically ill patients:  140 - 180 mg/dL   Lab Results  Component Value Date   GLUCAP 258 (H) 01/08/2018   HGBA1C 10.5 (H) 09/16/2017    Review of Glycemic Control Results for Crystal Yates, Crystal Yates (MRN [id number]) as of 01/08/2018 12:56  Ref. Range 01/07/2018 22:19 01/08/2018 07:52 01/08/2018 11:54  Glucose-Capillary Latest Ref Range: 70 - 99 mg/dL 323 (H) 173 (H) 258 (H)   Diabetes history: Type 2 DM Outpatient Diabetes medications: Tresiba 40 units QHs, Trulicity 1.5 mg Qwk Current orders for Inpatient glycemic control: Novolog 0-15 units TID, Lantus 20 units QD Cortef 5 mg QPM, 15 mg QAM Florinef 0.1 mg BID  Inpatient Diabetes Program Recommendations:    If post prandials continue to exceed 180 mg/dL, consider adding Novolog 4 units TID (assuming patient is consuming >50% of meal).    Thanks, Bronson Curb, MSN, RNC-OB Diabetes Coordinator [phone] (8a-5p)

## 2018-01-08 NOTE — Progress Notes (Signed)
Physical Therapy Progress Note  SATURATION QUALIFICATIONS: (This note is used to comply with regulatory documentation for home oxygen)  Patient Saturations on Room Air at Rest = Not tested  Patient Saturations on Room Air while Ambulating = Not tested  Patient Saturations on 3 Liters of oxygen while Ambulating = 80%  Please briefly explain why patient needs home oxygen: Pt is unable to maintain oxygen saturations >87% on 3L/min supplemental O2.   Rolinda Roan, PT, DPT Acute Rehabilitation Services Pager: [phone] Office: [phone]

## 2018-01-08 NOTE — Progress Notes (Signed)
Physical Therapy Treatment Patient Details Name: Crystal Yates MRN: [id number] DOB: 08/31/1955 Today's Date: 01/08/2018    History of Present Illness Pt adm with recurrent acute respiratory failure and acute on chronic heart failure. Intubated 10/10-10/11. PMH - chf, cad, dm, htn, obesity, copd, ckd, laparotomy for SBO    PT Comments    Pt progressing towards physical therapy goals. Was able to tolerate hallway ambulation on 3L/min supplemental O2, however sats decreasing as low as 80%. See gait details for more information. Pt reports she prefers rollator over RW if we can bring the rollator for gait training next session. Will continue to follow.    Follow Up Recommendations  Home health PT;Supervision - Intermittent     Equipment Recommendations  None recommended by PT    Recommendations for Other Services       Precautions / Restrictions Precautions Precautions: None Restrictions Weight Bearing Restrictions: No    Mobility  Bed Mobility Overal bed mobility: Needs Assistance Bed Mobility: Supine to Sit     Supine to sit: Min guard;HOB elevated     General bed mobility comments: Pt insistent on raising HOB up so pt essentially in long sitting. No assist required for pt to bring LE's off EOB and scoot out to get feet on floor.   Transfers Overall transfer level: Needs assistance Equipment used: Rolling walker (2 wheeled) Transfers: Sit to/from Stand Sit to Stand: Min guard         General transfer comment: Close guard for safety but no physical asssit required.   Ambulation/Gait Ambulation/Gait assistance: Min guard;Supervision Gait Distance (Feet): 120 Feet Assistive device: Rolling walker (2 wheeled) Gait Pattern/deviations: Step-through pattern;Decreased stride length Gait velocity: Decreased Gait velocity interpretation: 1.31 - 2.62 ft/sec, indicative of limited community ambulator General Gait Details: Ambulated on 3L/min supplemental O2.  Sats decreasing as low as 80%. Able to be improved with pursed-lip breathing but consistently around 86-88%. Increased O2 to 4L/min after return to room as pt was straining on the commode to have a BM and sats remained low. With increase to 4L/min sats improved to 93% in sitting.    Stairs             Wheelchair Mobility    Modified Rankin (Stroke Patients Only)       Balance Overall balance assessment: Mild deficits observed, not formally tested                                          Cognition Arousal/Alertness: Awake/alert Behavior During Therapy: WFL for tasks assessed/performed Overall Cognitive Status: Within Functional Limits for tasks assessed                                        Exercises      General Comments        Pertinent Vitals/Pain Pain Assessment: No/denies pain    Home Living                      Prior Function            PT Goals (current goals can now be found in the care plan section) Acute Rehab PT Goals Patient Stated Goal: return home PT Goal Formulation: With patient/family Time For Goal Achievement: 01/20/18 Potential to Achieve Goals: Good  Progress towards PT goals: Progressing toward goals    Frequency    Min 3X/week      PT Plan Current plan remains appropriate    Co-evaluation              AM-PAC PT "6 Clicks" Daily Activity  Outcome Measure  Difficulty turning over in bed (including adjusting bedclothes, sheets and blankets)?: A Little Difficulty moving from lying on back to sitting on the side of the bed? : Unable Difficulty sitting down on and standing up from a chair with arms (e.g., wheelchair, bedside commode, etc,.)?: Unable Help needed moving to and from a bed to chair (including a wheelchair)?: A Little Help needed walking in hospital room?: A Little Help needed climbing 3-5 steps with a railing? : A Little 6 Click Score: 14    End of Session  Equipment Utilized During Treatment: Gait belt;Oxygen Activity Tolerance: Patient tolerated treatment well Patient left: in chair;with call bell/phone within reach;with family/visitor present;with nursing/sitter in room(IV team) Nurse Communication: Mobility status PT Visit Diagnosis: Other abnormalities of gait and mobility (R26.89)     Time: 5183-4373 PT Time Calculation (min) (ACUTE ONLY): 36 min  Charges:  $Gait Training: 23-37 mins                     Rolinda Roan, PT, DPT Acute Rehabilitation Services Pager: [phone] Office: [place] 01/08/2018, 10:19 AM

## 2018-01-09 DIAGNOSIS — J9621 Acute and chronic respiratory failure with hypoxia: Secondary | ICD-10-CM | POA: Diagnosis not present

## 2018-01-09 DIAGNOSIS — I5033 Acute on chronic diastolic (congestive) heart failure: Secondary | ICD-10-CM

## 2018-01-09 DIAGNOSIS — I130 Hypertensive heart and chronic kidney disease with heart failure and stage 1 through stage 4 chronic kidney disease, or unspecified chronic kidney disease: Secondary | ICD-10-CM | POA: Diagnosis not present

## 2018-01-09 DIAGNOSIS — J9622 Acute and chronic respiratory failure with hypercapnia: Secondary | ICD-10-CM | POA: Diagnosis not present

## 2018-01-09 LAB — HEPATIC FUNCTION PANEL
ALK PHOS: 85 U/L (ref 38–126)
ALT: 20 U/L (ref 0–44)
AST: 20 U/L (ref 15–41)
Albumin: 2.2 g/dL — ABNORMAL LOW (ref 3.5–5.0)
Bilirubin, Direct: <0.1 mg/dL (ref 0.0–0.2)
TOTAL PROTEIN: 5.4 g/dL — AB (ref 6.5–8.1)
Total Bilirubin: 0.5 mg/dL (ref 0.3–1.2)

## 2018-01-09 LAB — CULTURE, BLOOD (ROUTINE X 2)
Culture: NO GROWTH
Culture: NO GROWTH
SPECIAL REQUESTS: ADEQUATE

## 2018-01-09 LAB — RENAL FUNCTION PANEL
ALBUMIN: 2.2 g/dL — AB (ref 3.5–5.0)
Anion gap: 9 (ref 5–15)
BUN: 18 mg/dL (ref 8–23)
CALCIUM: 8.7 mg/dL — AB (ref 8.9–10.3)
CO2: 42 mmol/L — AB (ref 22–32)
CREATININE: 1 mg/dL (ref 0.44–1.00)
Chloride: 94 mmol/L — ABNORMAL LOW (ref 98–111)
GFR, EST NON AFRICAN AMERICAN: 59 mL/min — AB (ref >60)
Glucose, Bld: 139 mg/dL — ABNORMAL HIGH (ref 70–99)
PHOSPHORUS: 3.5 mg/dL (ref 2.5–4.6)
Potassium: 3.2 mmol/L — ABNORMAL LOW (ref 3.5–5.1)
Sodium: 145 mmol/L (ref 135–145)

## 2018-01-09 LAB — GLUCOSE, CAPILLARY
GLUCOSE-CAPILLARY: 151 mg/dL — AB (ref 70–99)
GLUCOSE-CAPILLARY: 150 mg/dL — AB (ref 70–99)
Glucose-Capillary: 122 mg/dL — ABNORMAL HIGH (ref 70–99)
Glucose-Capillary: 118 mg/dL — ABNORMAL HIGH (ref 70–99)

## 2018-01-09 LAB — MAGNESIUM: MAGNESIUM: 1.5 mg/dL — AB (ref 1.7–2.4)

## 2018-01-09 MED ORDER — SALINE SPRAY 0.65 % NA SOLN
1.0000 | NASAL | Status: DC | PRN
  Filled 2018-01-09: qty 44

## 2018-01-09 MED ORDER — POTASSIUM CHLORIDE CRYS ER 20 MEQ PO TBCR
40.0000 meq | EXTENDED_RELEASE_TABLET | Freq: Once | ORAL | Status: AC
  Administered 2018-01-09: 40 meq via ORAL
  Filled 2018-01-09: qty 2

## 2018-01-09 MED ORDER — IPRATROPIUM-ALBUTEROL 0.5-2.5 (3) MG/3ML IN SOLN
3.0000 mL | RESPIRATORY_TRACT | Status: DC | PRN
  Administered 2018-01-13: 3 mL via RESPIRATORY_TRACT
  Filled 2018-01-09: qty 3

## 2018-01-09 MED ORDER — MAGNESIUM SULFATE 4 GM/100ML IV SOLN
4.0000 g | Freq: Once | INTRAVENOUS | Status: AC
  Administered 2018-01-09: 4 g via INTRAVENOUS
  Filled 2018-01-09: qty 100

## 2018-01-09 NOTE — Progress Notes (Signed)
RT NOTES: Instructed patient on pursed lip breathing technique per MD order. Patient able to perform without assistance and says it does help her. Answered all patient's questions about technique and her shortness of breath. She stated her sinus congestion may make it hard and would like to talk to the doctor about it. Notified nurse of patient's concern.

## 2018-01-09 NOTE — Progress Notes (Signed)
Subjective: She is doing well today. Did not have any episode of shortness of breath or panic attack since yesterday. We talked about possibility of discharging her today after respiratory therapy. She became tearful, saying that she is very concern about going home.  Objective:  Vital signs in last 24 hours: Vitals:   01/09/18 0033 01/09/18 0621 01/09/18 0741 01/09/18 0822  BP: 132/66   117/62  Pulse: 71   88  Resp: (!) 22   20  Temp: 97.6 F (36.4 C)   (!) 97.5 F (36.4 C)  TempSrc:    Oral  SpO2: 98%  96% 96%  Weight:  105.2 kg    Height:       Vital signs reviewed. Physical Exam  Constitutional: She is oriented to person, place, and time and well-developed, well-nourished, and in no distress. No distress.  Cardiovascular: Normal rate, regular rhythm and normal heart sounds.  No murmur heard. Pulmonary/Chest: Effort normal. She has wheezes.  Musculoskeletal: She exhibits edema.  Neurological: She is alert and oriented to person, place, and time.  She was tearful in middle of conversation.   Assessment/Plan:  Principal Problem:   Acute hypoxemic respiratory failure (HCC) Active Problems:   Adrenal insufficiency (HCC)   Type 2 diabetes mellitus without complication, with long-term current use of insulin (HCC)   Chronic combined systolic and diastolic CHF (congestive heart failure) (HCC)  SOB, Acute hypercapnic respiratory failure (Improved):  Symptoms improved. O2 sat >92 with 3 li of nasal O2 at rest. Denies any dyspnea attack today. Has tolerated ambulation with nasal O2 without sob. (How ever has had low O2 sat during ambulation, per PT note)  -Nasal O2 as needed to keep o2 sat>92% -Continue with IS -Monitor overnight and may discharge tomorrow. May discusee placement to SNF  -Respiratory therapy consult placed-->Instructed patient on pursed lip breathing technique per MD order. Patient able to perform without assistance and says it does help her  CHF:   Last Echo on 10/19/2017: LVEF: 76-54%, grade 1 diastolic dysfunction Does not seem to be with acute exacerbation on this admission, particularly as the patient was stable on discharge a day before this admission, no symptoms of acute ischemia that might have exacerbated it  Volume overload improved.    Is on PO Torsemide 40 mg BID.  I/O yesterday: net -3960 cc today Cr:1 K: 3.2 (Repleated) Mg: 1.5 (Repleted)  -Continue Torsemide 40 mg BID -BMP daily -Check Mg daily -Strict I/O -Weight daily   Hypoalbuminemia:  Alb :2.1 Can also have a role in her persistent LE edema  Urine Pr/Cr ratio: nl  -Canceled 24 h urine Pr -No further management  CAD: No chest pain. Stable  -c/w ASA, Plavix, Lipitor,   Adrenal insufficiency: Stable -C/w Florinef 0.1 mg BID and Hydrocortisone 15 mg in AM and 5 mg in the evening   Hypothyroidism: Last TSH: 2.2,  three months ago. -Continue synthroid 150 mcg QD  DM:Blood glucose today 139 CBG (last 3)   BMP Latest Ref Rng & Units 01/09/2018 01/08/2018 01/07/2018  Glucose 70 - 99 mg/dL 139(H) 271(H) 117(H)  BUN 8 - 23 mg/dL 18 24(H) 26(H)  Creatinine 0.44 - 1.00 mg/dL 1.00 1.02(H) 1.20(H)  BUN/Creat Ratio 12 - 28 - - -  Sodium 135 - 145 mmol/L 145 139 141  Potassium 3.5 - 5.1 mmol/L 3.2(L) 3.5 3.4(L)  Chloride 98 - 111 mmol/L 94(L) 99 98  CO2 22 - 32 mmol/L 42(H) 34(H) 34(H)  Calcium 8.9 - 10.3 mg/dL  8.7(L) 8.6(L) 8.9   -C/w with 5 unit of Novolog 3 times daily with meal -C/w Moderate SSI and Lantus 20 mg QD -Hypoglycemia protocol for CBG,70  HTN: BP 117/62 -c/w Metoprolol succinate 12.5 mg QD  COPD on chronic oxygen therapy. Currently on 3 li nasal O2 at hospital. -c/w Duoneb nebulizer 3 times daily -c/w Pulmicort  -Keep O2 % at least 92 %  Panic attack and anxiety: No panic attack since yesterday. Mentions that she could prevent an attack this AM by breathing through her nose. Respiratory therapy consulted and gave patient  some breathing exercise.  -Held Alprazolam on this admission due to respiratory failure -C/w Cymbalta 60 mg QD    Back wound and abdominal would 2/2 surgery  -C/w Wound care -Will consider home health for wound care   Code: Full Diet: Heart healthy, carb modified VTE ppx:  Hep IV fluid: 10 cc/h  Dispo: Anticipated discharge in approximately 1-2  days if remains stable and symptoms improvement.Patient has Hx of multiple recent admission and still high risk for re admission. Will keep her in hospital for next few days to optimize home medication dose to prevent or decrease rate of readmission.  Dewayne Hatch, MD 01/09/2018, 3:18 PM [phone]

## 2018-01-09 NOTE — Progress Notes (Signed)
PT Cancellation Note  Patient Details Name: Crystal Yates MRN: [id number] DOB: September 01, 1955   Cancelled Treatment:    Reason Eval/Treat Not Completed: Fatigue/lethargy limiting ability to participate; patient just back from walking close to 500' per nursing.  Will attempt again another day.   Reginia Naas 01/09/2018, 4:50 PM  Magda Kiel, [place] [phone] 01/09/2018

## 2018-01-10 DIAGNOSIS — E2740 Unspecified adrenocortical insufficiency: Secondary | ICD-10-CM | POA: Diagnosis not present

## 2018-01-10 DIAGNOSIS — J9622 Acute and chronic respiratory failure with hypercapnia: Secondary | ICD-10-CM | POA: Diagnosis not present

## 2018-01-10 DIAGNOSIS — J9621 Acute and chronic respiratory failure with hypoxia: Secondary | ICD-10-CM | POA: Diagnosis not present

## 2018-01-10 DIAGNOSIS — I110 Hypertensive heart disease with heart failure: Secondary | ICD-10-CM | POA: Diagnosis not present

## 2018-01-10 LAB — BASIC METABOLIC PANEL
ANION GAP: 8 (ref 5–15)
Anion gap: 14 (ref 5–15)
BUN: 14 mg/dL (ref 8–23)
BUN: 15 mg/dL (ref 8–23)
CALCIUM: 8.6 mg/dL — AB (ref 8.9–10.3)
CO2: 43 mmol/L — ABNORMAL HIGH (ref 22–32)
CO2: 38 mmol/L — ABNORMAL HIGH (ref 22–32)
CREATININE: 1.17 mg/dL — AB (ref 0.44–1.00)
CREATININE: 1.03 mg/dL — AB (ref 0.44–1.00)
Calcium: 8.5 mg/dL — ABNORMAL LOW (ref 8.9–10.3)
Chloride: 90 mmol/L — ABNORMAL LOW (ref 98–111)
Chloride: 89 mmol/L — ABNORMAL LOW (ref 98–111)
GFR calc Af Amer: >60 mL/min (ref >60)
GFR, EST AFRICAN AMERICAN: 57 mL/min — AB (ref >60)
GFR, EST NON AFRICAN AMERICAN: 49 mL/min — AB (ref >60)
GFR, EST NON AFRICAN AMERICAN: 57 mL/min — AB (ref >60)
GLUCOSE: 164 mg/dL — AB (ref 70–99)
Glucose, Bld: 108 mg/dL — ABNORMAL HIGH (ref 70–99)
Potassium: 2.2 mmol/L — CL (ref 3.5–5.1)
Potassium: 2.8 mmol/L — ABNORMAL LOW (ref 3.5–5.1)
SODIUM: 141 mmol/L (ref 135–145)
SODIUM: 141 mmol/L (ref 135–145)

## 2018-01-10 LAB — GLUCOSE, CAPILLARY
GLUCOSE-CAPILLARY: 116 mg/dL — AB (ref 70–99)
GLUCOSE-CAPILLARY: 136 mg/dL — AB (ref 70–99)
GLUCOSE-CAPILLARY: 98 mg/dL (ref 70–99)
Glucose-Capillary: 131 mg/dL — ABNORMAL HIGH (ref 70–99)
Glucose-Capillary: 196 mg/dL — ABNORMAL HIGH (ref 70–99)

## 2018-01-10 LAB — RENAL FUNCTION PANEL
Albumin: 2.3 g/dL — ABNORMAL LOW (ref 3.5–5.0)
Anion gap: 10 (ref 5–15)
BUN: 15 mg/dL (ref 8–23)
CALCIUM: 8.7 mg/dL — AB (ref 8.9–10.3)
CHLORIDE: 89 mmol/L — AB (ref 98–111)
CO2: 43 mmol/L — ABNORMAL HIGH (ref 22–32)
CREATININE: 1.04 mg/dL — AB (ref 0.44–1.00)
GFR calc Af Amer: >60 mL/min (ref >60)
GFR, EST NON AFRICAN AMERICAN: 56 mL/min — AB (ref >60)
Glucose, Bld: 150 mg/dL — ABNORMAL HIGH (ref 70–99)
Phosphorus: 2.8 mg/dL (ref 2.5–4.6)
Potassium: 2 mmol/L — CL (ref 3.5–5.1)
Sodium: 142 mmol/L (ref 135–145)

## 2018-01-10 LAB — MAGNESIUM: MAGNESIUM: 1.6 mg/dL — AB (ref 1.7–2.4)

## 2018-01-10 MED ORDER — POTASSIUM CHLORIDE CRYS ER 20 MEQ PO TBCR
40.0000 meq | EXTENDED_RELEASE_TABLET | ORAL | Status: AC
  Administered 2018-01-10 – 2018-01-11 (×3): 40 meq via ORAL
  Filled 2018-01-10 (×3): qty 2

## 2018-01-10 MED ORDER — POTASSIUM CHLORIDE 10 MEQ/100ML IV SOLN
10.0000 meq | INTRAVENOUS | Status: AC
  Administered 2018-01-10 – 2018-01-11 (×3): 10 meq via INTRAVENOUS
  Filled 2018-01-10 (×3): qty 100

## 2018-01-10 MED ORDER — FLUDROCORTISONE ACETATE 0.1 MG PO TABS
0.1000 mg | ORAL_TABLET | Freq: Every day | ORAL | Status: DC
  Administered 2018-01-11: 0.1 mg
  Filled 2018-01-10: qty 1

## 2018-01-10 MED ORDER — POTASSIUM CHLORIDE CRYS ER 20 MEQ PO TBCR
40.0000 meq | EXTENDED_RELEASE_TABLET | Freq: Once | ORAL | Status: AC
  Administered 2018-01-10: 40 meq via ORAL
  Filled 2018-01-10: qty 2

## 2018-01-10 MED ORDER — MAGNESIUM SULFATE 2 GM/50ML IV SOLN
2.0000 g | Freq: Once | INTRAVENOUS | Status: AC
  Administered 2018-01-10: 2 g via INTRAVENOUS
  Filled 2018-01-10: qty 50

## 2018-01-10 MED ORDER — POTASSIUM CHLORIDE 10 MEQ/100ML IV SOLN
10.0000 meq | INTRAVENOUS | Status: AC
  Administered 2018-01-10 (×6): 10 meq via INTRAVENOUS
  Filled 2018-01-10 (×6): qty 100

## 2018-01-10 MED ORDER — POTASSIUM CHLORIDE CRYS ER 20 MEQ PO TBCR
30.0000 meq | EXTENDED_RELEASE_TABLET | Freq: Every day | ORAL | Status: DC
  Administered 2018-01-10 – 2018-01-11 (×2): 30 meq via ORAL
  Filled 2018-01-10 (×2): qty 1

## 2018-01-10 NOTE — Clinical Social Work Note (Addendum)
CSW consulted for SNF placement however PT recommending HH and HH was discussed as disposition plan in rounds this AM. Pt is walking 200 feet. RNCM has set up [facility] for d/c. Clinical Social Worker will sign off for now as social work intervention is no longer needed. Please consult [place] again if new need arises.   Shelton Silvas A Halim Surrette 01/10/2018

## 2018-01-10 NOTE — Progress Notes (Signed)
Nutrition Follow Up  DOCUMENTATION CODES:   Morbid obesity  INTERVENTION:    Continue HH/Carbohydrate Modified diet  NEW NUTRITION DIAGNOSIS:   Increased nutrient needs related to acute illness as evidenced by estimated needs, ongoing  NEW GOAL:   Patient will meet greater than or equal to 90% of their needs, met  MONITOR:   PO intake, Labs, Skin, Weight trends, I & O's  ASSESSMENT:   62 yo female with PMH of HTN, DM, HLD, thyroid disease, CAD, CKD, adrenal insufficiency, CHF, COPD, on home oxygen who was admitted on 10/10 with respiratory distress requiring intubation.  Recent admission for CHF exacerbation. Discharged 10/9.  Eating well; PO intake 100% during last admission.   Recent surgery for SBO.  Surgical wound is healing well.  10/11 extubated, TF discontinued  Pt transferred out of [id number] to [facility] 10/12. Current PO intake excellent at 100% per flowsheets. Labs & medications reviewed. K 2.2 (L). Mg 1.6 (L). CBG's [id number].  Diet Order:   Diet Order            Diet heart healthy/carb modified Room service appropriate? Yes; Fluid consistency: Thin  Diet effective now             EDUCATION NEEDS:   No education needs have been identified at this time  Skin:  Skin Assessment: Skin Integrity Issues: Skin Integrity Issues:: Stage II, Incisions Stage II: R buttocks Incisions: recent abdominal surgery--healing well per surgery notes  Last BM:  10/16  Height:   Ht Readings from Last 1 Encounters:  01/04/18 '5\' 2"'  (1.575 m)   Weight:   Wt Readings from Last 1 Encounters:  01/09/18 105.2 kg   Ideal Body Weight:  50 kg  BMI:  Body mass index is 42.42 kg/m.  Estimated Nutritional Needs:   Kcal:  1800-2000  Protein:  90-105 gm  Fluid:  1.8-2.0 L  Arthur Holms, RD, LDN Pager #: [phone] After-Hours Pager #: [phone]

## 2018-01-10 NOTE — Progress Notes (Signed)
Physical Therapy Treatment Patient Details Name: Crystal Yates MRN: [id number] DOB: July 14, 1955 Today's Date: 01/10/2018    History of Present Illness Pt adm with recurrent acute respiratory failure and acute on chronic heart failure. Intubated 10/10-10/11. PMH - chf, cad, dm, htn, obesity, copd, ckd, laparotomy for SBO    PT Comments    Patient progressing with therapy, increasing ambulation distance and satting well on 2L with activity, which is her baseline.  Pt with use of rollator for seated breaks during walk due to deconditioning and fatigue, pt SpO2 and HR WNL. BP 112/74 after activity. Cont to rec HHPT.    Follow Up Recommendations  Home health PT;Supervision - Intermittent     Equipment Recommendations  None recommended by PT    Recommendations for Other Services       Precautions / Restrictions Precautions Precautions: None Restrictions Weight Bearing Restrictions: No    Mobility  Bed Mobility Overal bed mobility: Needs Assistance Bed Mobility: Supine to Sit     Supine to sit: Min guard;HOB elevated     General bed mobility comments: Pt insistent on raising HOB up so pt essentially in long sitting. No assist required for pt to bring LE's off EOB and scoot out to get feet on floor.   Transfers Overall transfer level: Needs assistance Equipment used: Rolling walker (2 wheeled) Transfers: Sit to/from Stand Sit to Stand: Min guard         General transfer comment: Close guard for safety but no physical asssit required.   Ambulation/Gait Ambulation/Gait assistance: Min guard;Supervision Gait Distance (Feet): 200 Feet(150' 50') Assistive device: Rolling walker (2 wheeled) Gait Pattern/deviations: Step-through pattern;Decreased stride length Gait velocity: decreased   General Gait Details: ambulating on 2L, DOE 1/4 by end of session, pt fatigued, requried 1 rest break. SpO2 and HR WNL   Stairs             Wheelchair Mobility     Modified Rankin (Stroke Patients Only)       Balance Overall balance assessment: Mild deficits observed, not formally tested                                          Cognition Arousal/Alertness: Awake/alert Behavior During Therapy: WFL for tasks assessed/performed Overall Cognitive Status: Within Functional Limits for tasks assessed                                        Exercises      General Comments        Pertinent Vitals/Pain Pain Assessment: No/denies pain    Home Living                      Prior Function            PT Goals (current goals can now be found in the care plan section) Acute Rehab PT Goals Patient Stated Goal: return home PT Goal Formulation: With patient/family Time For Goal Achievement: 01/20/18 Potential to Achieve Goals: Good Progress towards PT goals: Progressing toward goals    Frequency    Min 3X/week      PT Plan Current plan remains appropriate    Co-evaluation              AM-PAC PT "6 Clicks" Daily Activity  Outcome Measure  Difficulty turning over in bed (including adjusting bedclothes, sheets and blankets)?: A Little Difficulty moving from lying on back to sitting on the side of the bed? : Unable Difficulty sitting down on and standing up from a chair with arms (e.g., wheelchair, bedside commode, etc,.)?: Unable Help needed moving to and from a bed to chair (including a wheelchair)?: A Little Help needed walking in hospital room?: A Little Help needed climbing 3-5 steps with a railing? : A Little 6 Click Score: 14    End of Session Equipment Utilized During Treatment: Gait belt;Oxygen Activity Tolerance: Patient tolerated treatment well Patient left: in bed;with call bell/phone within reach;with family/visitor present Nurse Communication: Mobility status PT Visit Diagnosis: Other abnormalities of gait and mobility (R26.89)     Time: 3557-3220 PT Time  Calculation (min) (ACUTE ONLY): 50 min  Charges:  $Gait Training: 23-37 mins                     Reinaldo Berber, PT, DPT Acute Rehabilitation Services Pager: [phone] Office: [place] 01/10/2018, 1:51 PM

## 2018-01-10 NOTE — Progress Notes (Addendum)
Subjective: I saw and evaluated patient this morning.  She feels better, currently on 2 L of nasal O2, denies any shortness of breath.  Did not have panic attack since yesterday.  She asked about her low potassium.  All of her questions were addressed.  I also talked to her and her husband about their idea about sending her to SNF temporarily after discharge.   Objective:  Vital signs in last 24 hours: Vitals:   01/09/18 1951 01/10/18 0032 01/10/18 0734 01/10/18 0929  BP:  (!) 141/66  114/63  Pulse:  70  75  Resp:  20  18  Temp:  (!) 97.1 F (36.2 C)  (!) 97.5 F (36.4 C)  TempSrc:  Axillary  Oral  SpO2: 92% 98% 95% 97%  Weight:      Height:      General: Alert and oriented x3, in no acute distress on 2 L nasal oxygen CV: Normal S1 and S2, no murmur Lungs: she has some wheezing, no rales, some bibasilar rhonchi Abdomen: Soft, nontender normal bowel sounds Extremities: SCDs, 1+ bilateral lower extremity pitting edema Psychiatric: Normal mood and affect, normal behavior BMP Latest Ref Rng & Units 01/10/2018 01/10/2018 01/09/2018  Glucose 70 - 99 mg/dL 108(H) 150(H) 139(H)  BUN 8 - 23 mg/dL 14 15 18   Creatinine 0.44 - 1.00 mg/dL 1.17(H) 1.04(H) 1.00  BUN/Creat Ratio 12 - 28 - - -  Sodium 135 - 145 mmol/L 141 142 145  Potassium 3.5 - 5.1 mmol/L 2.2(LL) 2.0(LL) 3.2(L)  Chloride 98 - 111 mmol/L 90(L) 89(L) 94(L)  CO2 22 - 32 mmol/L 43(H) 43(H) 42(H)  Calcium 8.9 - 10.3 mg/dL 8.6(L) 8.7(L) 8.7(L)   Assessment/Plan:  Principal Problem:   Acute hypoxemic respiratory failure (HCC) Active Problems:   Adrenal insufficiency (HCC)   Type 2 diabetes mellitus without complication, with long-term current use of insulin (HCC)   Chronic combined systolic and diastolic CHF (congestive heart failure) (HCC)  SOB, Acute hypercapnic respiratory failure (Improved):  Symptoms significantly improved, Is on 2  liters nasal O2. patient denies shortness of breath. -C/w Torsemide 40 mg BID -BMP  daily -Nasal O2 as needed to keep o2 sat>92% -Continue with IS -Consult social worker for placement to SNF and talking to family (further conversation showed she is not very interested in that)  CHF: No need for can acute exacerbation.  She is getting diuresis.  Volume status is ok.  (lower extremity edema has been improved significantly). Net -1335 yesterday -Continue with torsemide 40 mg twice daily -BMP daily--> Hypokalemia today -Strict I/o -Daily weight  COPD: On chronic oxygen at home. Today oxygen requirement improved. -Continue with IS -Outpatient pulmonary rehab is recommended -c/w Duoneb nebulizer 3 times daily -c/w Pulmicort  -Keep O2 % at least 92 %  Hypokalemia: K:2 this AM (from 3.2 yesterday) repleted with 40 meq PO Kdur and is getting IV potassium 40 meq and then 20 meq. K three hours later was 2.2, is still getting IV potasium. Reports some weakness but no other symptoms. Mg:1.6, repleted  In setting of diuresis and hypomagnesimia, also high dose of Fludrocortisone therapy may be another reason. -Will monitor K and Mg this PM and replet as needed -Notify IM team if symptomatic -Decrease Florinef to 0.1 QD  HTN:  -c/w Metoprolol succinate 12.5 mg QD  Hypothyroidism: Stable  -Continue synthroid 150 mcg QD  DM:Blood glucose today 108.  Trolled  -C/w with 5 unit of Novolog 3 times daily with meal -C/w Moderate SSI  and Lantus 20 mg QD -Hypoglycemia protocol for CBG,70  CAD: No chest pain. Stable -c/w ASA, Plavix, Lipitor,  Adrenal insufficiency:  Was on Florinef 0.01 twice daily. Has hypokalemia, sodium 142, may need lower dose of Florinef -Change Florinef to 0.1 mg QD -Continue with hydrocortisone 15 mg in AM and 5 mg in the evening  Panic attackand anxiety:  Improved significantly.  No attack since 2 days ago. -Held Alprazolam on this admission due to respiratory failure -C/w Cymbalta 60 mg QD  Back wound and abdominal would 2/2  surgery -C/w Wound care -Will consider home health for wound care  Code:Full Diet: Heart healthy, carb modified VTE ppx: Hep IV fluid: 10 cc/h  Dispo: Anticipated discharge in approximately next day after electrolyte stability and if remain stable with O2 saturation  Dewayne Hatch, MD 01/10/2018, 4:03 PM Pager: [phone]

## 2018-01-10 NOTE — Progress Notes (Signed)
Pt went to bathroom to attempt to have a bowel movement. When done RN found pt had passed a significant amount of blood due to her hemrrhoids. RN will continue to monitor pt and report findings to night shift RN.

## 2018-01-10 NOTE — Progress Notes (Signed)
SATURATION QUALIFICATIONS: (This note is used to comply with regulatory documentation for home oxygen)  Patient Saturations on Room Air at Rest = 86%  Patient Saturations on Room Air while Ambulating = 80%  Patient Saturations on 2 Liters of oxygen while Ambulating = 96%  Please briefly explain why patient needs home oxygen: pt rapidly deoxygenates on room air and with minimal exertion

## 2018-01-10 NOTE — Care Management Note (Addendum)
Case Management Note  Patient Details  Name: Crystal Yates MRN: [id number] Date of Birth: 03-14-1956  Subjective/Objective:   Patient lives at home with spouse; PCPStein, [name], [place]; has private insurance with BCBS with prescription drug coverage; she is active with [facility] as prior to admission for [facility], nurses aide; will need to add HHPT to these services.  Will need to fax in to [facility].  10/16 [name] RN, BSN - RN to check ambulatory sats, pt to see patient today also.  Patient has home oxygen with [employer] in home patient at 2 liters so she will not need new oxygen.  She has a portable tank in her room. NCM faxed [facility] orders to [facility] on 10/16 for [place], [place], [place].                                Action/Plan: DC home when ready.  Expected Discharge Date:                  Expected Discharge Plan:  [place]  In-House Referral:     Discharge planning Services  CM Consult  Post Acute Care Choice:  Resumption of Svcs/PTA Provider Choice offered to:     DME Arranged:    DME Agency:     HH Arranged:  RN, Nurse's Aide, PT [place] Agency:  [facility]  Status of Service:  Completed, signed off  If discussed at [place] of Stay Meetings, dates discussed:    Additional Comments:  [name], RN 01/10/2018, 4:24 PM

## 2018-01-11 DIAGNOSIS — J9621 Acute and chronic respiratory failure with hypoxia: Secondary | ICD-10-CM | POA: Diagnosis not present

## 2018-01-11 DIAGNOSIS — E611 Iron deficiency: Secondary | ICD-10-CM | POA: Diagnosis present

## 2018-01-11 DIAGNOSIS — E2740 Unspecified adrenocortical insufficiency: Secondary | ICD-10-CM | POA: Diagnosis not present

## 2018-01-11 DIAGNOSIS — E119 Type 2 diabetes mellitus without complications: Secondary | ICD-10-CM | POA: Diagnosis not present

## 2018-01-11 DIAGNOSIS — J9622 Acute and chronic respiratory failure with hypercapnia: Secondary | ICD-10-CM | POA: Diagnosis not present

## 2018-01-11 LAB — RENAL FUNCTION PANEL
Albumin: 2.5 g/dL — ABNORMAL LOW (ref 3.5–5.0)
Anion gap: 8 (ref 5–15)
BUN: 10 mg/dL (ref 8–23)
CHLORIDE: 94 mmol/L — AB (ref 98–111)
CO2: 40 mmol/L — ABNORMAL HIGH (ref 22–32)
CREATININE: 1.05 mg/dL — AB (ref 0.44–1.00)
Calcium: 9 mg/dL (ref 8.9–10.3)
GFR calc Af Amer: >60 mL/min (ref >60)
GFR, EST NON AFRICAN AMERICAN: 56 mL/min — AB (ref >60)
Glucose, Bld: 127 mg/dL — ABNORMAL HIGH (ref 70–99)
Phosphorus: 2.9 mg/dL (ref 2.5–4.6)
Potassium: 3.3 mmol/L — ABNORMAL LOW (ref 3.5–5.1)
Sodium: 142 mmol/L (ref 135–145)

## 2018-01-11 LAB — HEMOGLOBIN AND HEMATOCRIT, BLOOD
HEMATOCRIT: 34.9 % — AB (ref 36.0–46.0)
HEMOGLOBIN: 10.6 g/dL — AB (ref 12.0–15.0)

## 2018-01-11 LAB — GLUCOSE, CAPILLARY
Glucose-Capillary: 111 mg/dL — ABNORMAL HIGH (ref 70–99)
Glucose-Capillary: 163 mg/dL — ABNORMAL HIGH (ref 70–99)
Glucose-Capillary: 88 mg/dL (ref 70–99)
Glucose-Capillary: 143 mg/dL — ABNORMAL HIGH (ref 70–99)

## 2018-01-11 LAB — CBC
HEMATOCRIT: 36.5 % (ref 36.0–46.0)
Hemoglobin: 10.9 g/dL — ABNORMAL LOW (ref 12.0–15.0)
MCH: 26.6 pg (ref 26.0–34.0)
MCHC: 29.9 g/dL — ABNORMAL LOW (ref 30.0–36.0)
MCV: 89 fL (ref 80.0–100.0)
PLATELETS: 318 10*3/uL (ref 150–400)
RBC: 4.1 MIL/uL (ref 3.87–5.11)
RDW: 15.1 % (ref 11.5–15.5)
WBC: 9.2 10*3/uL (ref 4.0–10.5)
nRBC: 0 % (ref 0.0–0.2)

## 2018-01-11 LAB — BASIC METABOLIC PANEL
ANION GAP: 10 (ref 5–15)
BUN: 13 mg/dL (ref 8–23)
CALCIUM: 9.4 mg/dL (ref 8.9–10.3)
CO2: 37 mmol/L — ABNORMAL HIGH (ref 22–32)
Chloride: 95 mmol/L — ABNORMAL LOW (ref 98–111)
Creatinine, Ser: 1.2 mg/dL — ABNORMAL HIGH (ref 0.44–1.00)
GFR, EST AFRICAN AMERICAN: 55 mL/min — AB (ref >60)
GFR, EST NON AFRICAN AMERICAN: 47 mL/min — AB (ref >60)
Glucose, Bld: 168 mg/dL — ABNORMAL HIGH (ref 70–99)
Potassium: 3.8 mmol/L (ref 3.5–5.1)
SODIUM: 142 mmol/L (ref 135–145)

## 2018-01-11 LAB — MAGNESIUM
MAGNESIUM: 1.7 mg/dL (ref 1.7–2.4)
Magnesium: 1.7 mg/dL (ref 1.7–2.4)

## 2018-01-11 LAB — IRON AND TIBC
Iron: 39 ug/dL (ref 28–170)
Saturation Ratios: 12 % (ref 10.4–31.8)
TIBC: 314 ug/dL (ref 250–450)
UIBC: 275 ug/dL

## 2018-01-11 LAB — FERRITIN: Ferritin: 49 ng/mL (ref 11–307)

## 2018-01-11 MED ORDER — CLOPIDOGREL BISULFATE 75 MG PO TABS
75.0000 mg | ORAL_TABLET | Freq: Every day | ORAL | Status: DC
  Administered 2018-01-12: 75 mg via ORAL
  Filled 2018-01-11: qty 1

## 2018-01-11 MED ORDER — TORSEMIDE 20 MG PO TABS
40.0000 mg | ORAL_TABLET | Freq: Every day | ORAL | Status: DC
  Filled 2018-01-11: qty 2

## 2018-01-11 MED ORDER — FLUDROCORTISONE ACETATE 0.1 MG PO TABS
0.1000 mg | ORAL_TABLET | Freq: Every day | ORAL | Status: DC
  Administered 2018-01-12 – 2018-01-16 (×5): 0.1 mg via ORAL
  Filled 2018-01-11 (×5): qty 1

## 2018-01-11 MED ORDER — ATORVASTATIN CALCIUM 80 MG PO TABS
80.0000 mg | ORAL_TABLET | Freq: Every day | ORAL | Status: DC
  Administered 2018-01-11 – 2018-01-16 (×6): 80 mg via ORAL
  Filled 2018-01-11 (×6): qty 1

## 2018-01-11 MED ORDER — MAGNESIUM OXIDE 400 (241.3 MG) MG PO TABS
400.0000 mg | ORAL_TABLET | Freq: Every day | ORAL | Status: DC
  Administered 2018-01-11 – 2018-01-17 (×7): 400 mg via ORAL
  Filled 2018-01-11 (×7): qty 1

## 2018-01-11 MED ORDER — LEVOTHYROXINE SODIUM 75 MCG PO TABS
150.0000 ug | ORAL_TABLET | Freq: Every day | ORAL | Status: DC
  Administered 2018-01-12 – 2018-01-17 (×5): 150 ug via ORAL
  Filled 2018-01-11 (×6): qty 2

## 2018-01-11 MED ORDER — SODIUM CHLORIDE 0.9 % IV SOLN
510.0000 mg | Freq: Once | INTRAVENOUS | Status: AC
  Administered 2018-01-11: 510 mg via INTRAVENOUS
  Filled 2018-01-11: qty 17

## 2018-01-11 MED ORDER — ASPIRIN 81 MG PO CHEW
81.0000 mg | CHEWABLE_TABLET | Freq: Every day | ORAL | Status: DC
  Administered 2018-01-12: 81 mg via ORAL
  Filled 2018-01-11: qty 1

## 2018-01-11 MED ORDER — PRO-STAT SUGAR FREE PO LIQD
30.0000 mL | Freq: Two times a day (BID) | ORAL | Status: DC
  Administered 2018-01-11 – 2018-01-17 (×13): 30 mL via ORAL
  Filled 2018-01-11 (×12): qty 30

## 2018-01-11 MED ORDER — MAGNESIUM 200 MG PO TABS
400.0000 mg | ORAL_TABLET | Freq: Every day | ORAL | Status: DC
  Filled 2018-01-11: qty 2

## 2018-01-11 MED ORDER — TORSEMIDE 20 MG PO TABS
20.0000 mg | ORAL_TABLET | Freq: Every day | ORAL | Status: DC
  Administered 2018-01-11: 20 mg via ORAL
  Filled 2018-01-11: qty 1

## 2018-01-11 NOTE — Progress Notes (Signed)
Physical Therapy Treatment Patient Details Name: Crystal Yates MRN: [id number] DOB: 1955-06-25 Today's Date: 01/11/2018    History of Present Illness Pt adm with recurrent acute respiratory failure and acute on chronic heart failure. Intubated 10/10-10/11. PMH - chf, cad, dm, htn, obesity, copd, ckd, laparotomy for SBO    PT Comments    Patient progressing with therapy, increased ambulation distance this visit with only 1 seated rest break. 2L remains above 90%, DOE 1/4. Will cont to follow and progress as tolerated, agree with HHPT at this time, with further progression may benefit from OP PT.     Follow Up Recommendations  Home health PT;Supervision - Intermittent     Equipment Recommendations  None recommended by PT    Recommendations for Other Services       Precautions / Restrictions Precautions Precautions: None Restrictions Weight Bearing Restrictions: No    Mobility  Bed Mobility Overal bed mobility: Needs Assistance Bed Mobility: Supine to Sit     Supine to sit: Min guard;HOB elevated     General bed mobility comments: Pt insistent on raising HOB up so pt essentially in long sitting. No assist required for pt to bring LE's off EOB and scoot out to get feet on floor.   Transfers Overall transfer level: Needs assistance Equipment used: Rolling walker (2 wheeled) Transfers: Sit to/from Stand Sit to Stand: Min guard         General transfer comment: Close guard for safety but no physical asssit required.   Ambulation/Gait Ambulation/Gait assistance: Min Gaffer (Feet): 250 Feet Assistive device: Rolling walker (2 wheeled) Gait Pattern/deviations: Step-through pattern;Decreased stride length Gait velocity: decreased   General Gait Details: increased distance, 2L DOE 1/4, 1x seated rest break   Stairs             Wheelchair Mobility    Modified Rankin (Stroke Patients Only)       Balance Overall  balance assessment: Mild deficits observed, not formally tested                                          Cognition Arousal/Alertness: Awake/alert Behavior During Therapy: WFL for tasks assessed/performed Overall Cognitive Status: Within Functional Limits for tasks assessed                                        Exercises      General Comments        Pertinent Vitals/Pain Pain Assessment: No/denies pain    Home Living                      Prior Function            PT Goals (current goals can now be found in the care plan section) Acute Rehab PT Goals Patient Stated Goal: return home PT Goal Formulation: With patient/family Time For Goal Achievement: 01/20/18 Potential to Achieve Goals: Good Progress towards PT goals: Progressing toward goals    Frequency    Min 3X/week      PT Plan Current plan remains appropriate    Co-evaluation              AM-PAC PT "6 Clicks" Daily Activity  Outcome Measure  Difficulty turning over in bed (including adjusting bedclothes, sheets and  blankets)?: A Little Difficulty moving from lying on back to sitting on the side of the bed? : Unable Difficulty sitting down on and standing up from a chair with arms (e.g., wheelchair, bedside commode, etc,.)?: Unable Help needed moving to and from a bed to chair (including a wheelchair)?: A Little Help needed walking in hospital room?: A Little Help needed climbing 3-5 steps with a railing? : A Little 6 Click Score: 14    End of Session Equipment Utilized During Treatment: Gait belt;Oxygen Activity Tolerance: Patient tolerated treatment well Patient left: in bed;with call bell/phone within reach;with family/visitor present Nurse Communication: Mobility status PT Visit Diagnosis: Other abnormalities of gait and mobility (R26.89)     Time: 1700-1733 PT Time Calculation (min) (ACUTE ONLY): 33 min  Charges:  $Gait Training: 23-37  mins                     Reinaldo Berber, PT, DPT Acute Rehabilitation Services Pager: [phone] Office: [place] 01/11/2018, 5:40 PM

## 2018-01-11 NOTE — Progress Notes (Addendum)
Subjective: Patient was seen and evaluated at bedside on morning rounds. Noticed blood in toilet after having bowel movement. (3 times since last night) Denies any abdominal pain, N/V. No pain at rectum when has bowel movement. Otherwise, patient mentions that she feels well. No sob.   Objective:  Vital signs in last 24 hours: Vitals:   01/11/18 0033 01/11/18 0440 01/11/18 0723 01/11/18 0747  BP: 119/69   124/60  Pulse: 72  71 66  Resp: 20  19 14   Temp: 97.9 F (36.6 C)   97.6 F (36.4 C)  TempSrc: Oral   Oral  SpO2: 96%  95% 97%  Weight:  100.1 kg    Height:       General: Alert and oriented x3, in no acute distress, on 3 L nasal O2 CV: RRR, no murmur Lungs: CTA, some mildly decrease breath sound at bases Abdomen: Soft, nontender, BS normal Musculoskeletal: Has some edema on hip, 1+ pitting edema lower extremities bilaterally. Psychiatric: Normal mood and affect, behavior is normal  BMP Latest Ref Rng & Units 01/11/2018 01/10/2018 01/10/2018  Glucose 70 - 99 mg/dL 127(H) 164(H) 108(H)  BUN 8 - 23 mg/dL 10 15 14   Creatinine 0.44 - 1.00 mg/dL 1.05(H) 1.03(H) 1.17(H)  BUN/Creat Ratio 12 - 28 - - -  Sodium 135 - 145 mmol/L 142 141 141  Potassium 3.5 - 5.1 mmol/L 3.3(L) 2.8(L) 2.2(LL)  Chloride 98 - 111 mmol/L 94(L) 89(L) 90(L)  CO2 22 - 32 mmol/L 40(H) 38(H) 43(H)  Calcium 8.9 - 10.3 mg/dL 9.0 8.5(L) 8.6(L)   Mg: 1.7  Assessment/Plan:  Principal Problem:   Acute on chronic respiratory failure with hypoxia and hypercapnia (HCC) Active Problems:   Adrenal insufficiency (HCC)   Type 2 diabetes mellitus without complication, with long-term current use of insulin (HCC)   Acute on chronic combined systolic and diastolic heart failure (HCC)  Acute on chronic respiratory failure with hypoxia and hypercapnia, O2 resuirement improved - Improving with diuresis   Adrenal insufficiency   reduced flornief down to 0.1mg  daily yesterday due to hypokalemia. -C/w Florinef 0.1  mg QD and Hydrocortisone 15 mg in AM and 5mg  in PM     Type 2 DM, Stable. BG today 127 -C/wwith 5 unit of Novolog 3 times daily with meal -C/w Moderate SSI and Lantus 20 mg QD -Hypoglycemia protocol for CBG,70   Blood per rectum: Reports previous history of hemorrhoid bleeding before at home.  Has had 3 episodes in hospital since yesterday.  Denies any abdominal pain, nausea vomiting, pain in the rectum bowel movement. Vital signs and hemoglobin is stable. Likely secondary to hemorrhoid bleeding from. -Hold Heparin  -SCD for VTE ppx -We will monitor hemoglobin and her symptoms today H &H at 3 PM  CHF: Better response with oral torsemide. LLE improved significantly, Lung exam has no crackle Today weight is 220 Ib that is satisfying as has passed the goal of 225 lbs. (not sure if I?O recorder correctly) -Decrease Torsemide to 20 BID -Monitor BMP -May discharge home today if hemorrhoid bleeding stable and hypokalemia resolves  Hypokalemia and hypomagnesmeia.  Had consistant hypokalemia yesterday despite repletion. Improved today to 3.3  - Check K and Mg this PM and replet as needed -C/w 30 meq K-dur  -Resume home Mg Ox 400 QD  HTN:BP today: 124/60 -c/w Metoprolol succinate 12.5 mg QD  Hypothyroidism: Stable -Continue synthroid 150 mcg QD  COPD: On chronic oxygen at home. Stable, is on 2-3 li nasal O2 at hospital. -  Continue with IS -Outpatient pulmonary rehab is recommended -c/w Duoneb nebulizer 3 times daily -c/w Pulmicort  -Keep O2 % at least 92 % -PT recommended home with home health   CAD: Asymptomatic and stable -c/w ASA, Plavix, Lipitor,  Panic attackand anxiety: Improved significantly.   Denies any more panic attack. -Held Alprazolam on this admission due to respiratory failure -C/w Cymbalta 60 mg QD  Code:Full Diet: Heart healthy, carb modified VTE ppx: Hep IV fluid: -  Dispo: Anticipated discharge today or tomorrow(s), if no more potasium and  hemoglobin drop and no significant hemorrhoid bleeding  Dewayne Hatch, MD 01/11/2018, 9:57 AM Pager: [phone]

## 2018-01-12 DIAGNOSIS — K625 Hemorrhage of anus and rectum: Secondary | ICD-10-CM

## 2018-01-12 DIAGNOSIS — E876 Hypokalemia: Secondary | ICD-10-CM

## 2018-01-12 DIAGNOSIS — J9621 Acute and chronic respiratory failure with hypoxia: Principal | ICD-10-CM

## 2018-01-12 DIAGNOSIS — F410 Panic disorder [episodic paroxysmal anxiety] without agoraphobia: Secondary | ICD-10-CM

## 2018-01-12 DIAGNOSIS — E039 Hypothyroidism, unspecified: Secondary | ICD-10-CM

## 2018-01-12 DIAGNOSIS — E2740 Unspecified adrenocortical insufficiency: Secondary | ICD-10-CM | POA: Diagnosis not present

## 2018-01-12 DIAGNOSIS — Z79899 Other long term (current) drug therapy: Secondary | ICD-10-CM

## 2018-01-12 DIAGNOSIS — I110 Hypertensive heart disease with heart failure: Secondary | ICD-10-CM

## 2018-01-12 DIAGNOSIS — Z79890 Hormone replacement therapy (postmenopausal): Secondary | ICD-10-CM

## 2018-01-12 DIAGNOSIS — Z955 Presence of coronary angioplasty implant and graft: Secondary | ICD-10-CM

## 2018-01-12 DIAGNOSIS — Z7982 Long term (current) use of aspirin: Secondary | ICD-10-CM

## 2018-01-12 DIAGNOSIS — Z8719 Personal history of other diseases of the digestive system: Secondary | ICD-10-CM

## 2018-01-12 DIAGNOSIS — I2510 Atherosclerotic heart disease of native coronary artery without angina pectoris: Secondary | ICD-10-CM

## 2018-01-12 DIAGNOSIS — I5043 Acute on chronic combined systolic (congestive) and diastolic (congestive) heart failure: Secondary | ICD-10-CM

## 2018-01-12 DIAGNOSIS — J9622 Acute and chronic respiratory failure with hypercapnia: Secondary | ICD-10-CM | POA: Diagnosis not present

## 2018-01-12 DIAGNOSIS — Z7902 Long term (current) use of antithrombotics/antiplatelets: Secondary | ICD-10-CM

## 2018-01-12 DIAGNOSIS — E119 Type 2 diabetes mellitus without complications: Secondary | ICD-10-CM

## 2018-01-12 DIAGNOSIS — Z794 Long term (current) use of insulin: Secondary | ICD-10-CM

## 2018-01-12 DIAGNOSIS — K5790 Diverticulosis of intestine, part unspecified, without perforation or abscess without bleeding: Secondary | ICD-10-CM

## 2018-01-12 DIAGNOSIS — J449 Chronic obstructive pulmonary disease, unspecified: Secondary | ICD-10-CM

## 2018-01-12 DIAGNOSIS — Z9981 Dependence on supplemental oxygen: Secondary | ICD-10-CM

## 2018-01-12 LAB — RENAL FUNCTION PANEL
ALBUMIN: 2.2 g/dL — AB (ref 3.5–5.0)
ANION GAP: 6 (ref 5–15)
BUN: 18 mg/dL (ref 8–23)
CALCIUM: 8.9 mg/dL (ref 8.9–10.3)
CO2: 39 mmol/L — ABNORMAL HIGH (ref 22–32)
Chloride: 95 mmol/L — ABNORMAL LOW (ref 98–111)
Creatinine, Ser: 1.24 mg/dL — ABNORMAL HIGH (ref 0.44–1.00)
GFR calc non Af Amer: 46 mL/min — ABNORMAL LOW (ref >60)
GFR, EST AFRICAN AMERICAN: 53 mL/min — AB (ref >60)
Glucose, Bld: 280 mg/dL — ABNORMAL HIGH (ref 70–99)
PHOSPHORUS: 3.4 mg/dL (ref 2.5–4.6)
POTASSIUM: 3.1 mmol/L — AB (ref 3.5–5.1)
SODIUM: 140 mmol/L (ref 135–145)

## 2018-01-12 LAB — GLUCOSE, CAPILLARY
GLUCOSE-CAPILLARY: 298 mg/dL — AB (ref 70–99)
GLUCOSE-CAPILLARY: 104 mg/dL — AB (ref 70–99)
GLUCOSE-CAPILLARY: 204 mg/dL — AB (ref 70–99)
Glucose-Capillary: 72 mg/dL (ref 70–99)

## 2018-01-12 LAB — CBC
HCT: 36.4 % (ref 36.0–46.0)
Hemoglobin: 10.9 g/dL — ABNORMAL LOW (ref 12.0–15.0)
MCH: 27 pg (ref 26.0–34.0)
MCHC: 29.9 g/dL — AB (ref 30.0–36.0)
MCV: 90.1 fL (ref 80.0–100.0)
PLATELETS: 273 10*3/uL (ref 150–400)
RBC: 4.04 MIL/uL (ref 3.87–5.11)
RDW: 15.3 % (ref 11.5–15.5)
WBC: 11.9 10*3/uL — ABNORMAL HIGH (ref 4.0–10.5)
nRBC: 0 % (ref 0.0–0.2)

## 2018-01-12 LAB — MAGNESIUM: Magnesium: 1.6 mg/dL — ABNORMAL LOW (ref 1.7–2.4)

## 2018-01-12 MED ORDER — POTASSIUM CHLORIDE CRYS ER 20 MEQ PO TBCR
40.0000 meq | EXTENDED_RELEASE_TABLET | ORAL | Status: AC
  Administered 2018-01-12 (×3): 40 meq via ORAL
  Filled 2018-01-12 (×3): qty 2

## 2018-01-12 MED ORDER — MAGNESIUM SULFATE 2 GM/50ML IV SOLN
2.0000 g | Freq: Once | INTRAVENOUS | Status: AC
  Administered 2018-01-12: 2 g via INTRAVENOUS
  Filled 2018-01-12: qty 50

## 2018-01-12 MED ORDER — TORSEMIDE 20 MG PO TABS
20.0000 mg | ORAL_TABLET | Freq: Every day | ORAL | Status: DC
  Administered 2018-01-12 – 2018-01-14 (×3): 20 mg via ORAL
  Filled 2018-01-12 (×2): qty 1

## 2018-01-12 MED ORDER — HYDROCORTISONE 2.5 % RE CREA
TOPICAL_CREAM | Freq: Two times a day (BID) | RECTAL | Status: DC
  Administered 2018-01-12: 1 via RECTAL
  Administered 2018-01-13 – 2018-01-16 (×7): via RECTAL
  Filled 2018-01-12: qty 28.35

## 2018-01-12 NOTE — Progress Notes (Deleted)
Internal Medicine Attending:   I saw and examined the patient. I reviewed the resident's note and I agree with the resident's findings and plan as documented in the resident's note.  Breathing improved, continues to diurese well despite lower torsemide dose. Will reduce to once a day, will need daily weight for today. New issues is repeat blood loss per rectum, this time not associated with defecation, concerning for repeat lower GI bleed, patient has hx of diverticulosis as well as recent surgery,  We will ask GI to come back and reevaluate.  Continue to trend CBC.  Difficult to predict D/C date with continuous ongoing issues, expect hopefully in next 2 days.

## 2018-01-12 NOTE — Consult Note (Signed)
Referring Provider: [facility]    Primary Care Physician:  Dortha Kern, [place] Primary Gastroenterologist:  Dr. Lyndel Safe  Reason for Consultation:   Recurrent lower Gi bleeding    ASSESSMENT AND PLAN:    62 yo female with painless rectal bleeding on plavix, asa and SQ heparin. Hgb stable at 10.9 this afternoon actually up from last night.  -could be hemorrhoidal bleeding. Diverticular hemorrhage remains possibility. She has had a colon resection so anastomotic bleed possible.  -similar scenario in late September, required 2 units. Presumed at that time to be a diverticular hemorrhage.  -Will Rx steroid cream in case hemorrhoidal but need to follow H+H and bleeding pattern closely.  -Will obtain last colonoscopy report (Dr. Lyndel Safe) -would like to put on clears for now in case intervention is needed.     HPI: Crystal Yates is a 62 y.o. female with multiple medical problems not limited to CAD /stenting in 2018 , combined systolic and diastolic heart failure , CKD 2 , COPD , and DM.  She has a history of perforated diverticulitis requiring resection in 2016.  Patient was admitted late September with painless rectal bleeding on Plavix and aspirin. Seen by [facility].  Patient required blood transfusion for drop in hgb from ~13 to 8.  Colonoscopy was reported to have been done ~ 3 years ago by Dr. Lyndel Safe in [place].  Bleeding was felt to be secondary to diverticular hemorrhage.  Colonoscopy was not repeated especially since patient had had bowel surgery a few weeks prior ( SBO requiring LOA) and wound VAC was apparently still in place. She has had multiple CBCs done since discharge and hemoglobin has stable in the mid 10 range range.   Patient readmitted on the 10th with acute respiratory failure, pulmonary edema, systolic CHF and AKI. During this admission she had recurrent painless rectal bleeding which stopped but has restarted a couple of days ago. Plavix and SQ heparin stopped  yesterday. Patient typically has one BM daily but today has had urge to have BM about 3 times. Each time there was blood but stool was brown. No associated rectal or abdominal pain.     Past Medical History:  Diagnosis Date  . Acute on chronic systolic heart failure exacerbation(HCC) 04/08/2016  . Arthritis   . CAD in native artery    a. Prior LAD stenting based on cath. b. RCA stenting 03/2016 x2.  . Chronic combined systolic and diastolic CHF (congestive heart failure) ([place])   . CKD (chronic kidney disease), stage II   . COPD (chronic obstructive pulmonary disease) ([place])   . Diabetes mellitus without complication ([place])   . Dyspnea   . Hashimoto's thyroiditis   . Hyperlipidemia   . Hypertension   . Myocardial infarction ([place])   . On home oxygen therapy    "2L; 24/7" (10/23/2017)  . Secondary adrenal insufficiency ([place])   . Thyroid disease   . Tobacco abuse     Past Surgical History:  Procedure Laterality Date  . ABDOMINAL SURGERY    . CESAREAN SECTION    . CHOLECYSTECTOMY    . COLON RESECTION    . CORONARY BALLOON ANGIOPLASTY N/A 10/13/2017   Procedure: CORONARY BALLOON ANGIOPLASTY;  Surgeon: Belva Crome, MD;  Location: [facility];  Service: Cardiovascular;  Laterality: N/A;  . HERNIA MESH REMOVAL    . HERNIA REPAIR    . LEFT HEART CATH AND CORONARY ANGIOGRAPHY N/A 10/13/2016   Procedure: Left Heart Cath and Coronary Angiography;  Surgeon: Nelva Bush, MD;  Location: [facility];  Service: Cardiovascular;  Laterality: N/A;  . RIGHT/LEFT HEART CATH AND CORONARY ANGIOGRAPHY N/A 10/13/2017   Procedure: RIGHT/LEFT HEART CATH AND CORONARY ANGIOGRAPHY;  Surgeon: Belva Crome, MD;  Location: [facility];  Service: Cardiovascular;  Laterality: N/A;  . SHOULDER ARTHROSCOPY    . TUBAL LIGATION      Prior to Admission medications   Medication Sig Start Date End Date Taking? Authorizing Provider  acetaminophen (TYLENOL) 325 MG tablet Take 2 tablets (650 mg  total) by mouth every 4 (four) hours as needed for headache or mild pain. 10/14/17  Yes Isaiah Serge, NP  albuterol (PROAIR HFA) 108 (90 Base) MCG/ACT inhaler Inhale 2 puffs into the lungs See admin instructions. Inhale 2 puffs into the lungs every 4-6 hours as needed for shortness of breath or wheezing 07/24/17  Yes [provider]  ALPRAZolam (XANAX) 0.25 MG tablet Take 1 tablet (0.25 mg total) by mouth 3 (three) times daily as needed for anxiety (Panic attack). 01/03/18  Yes Ina Homes, MD  aspirin 81 MG chewable tablet Chew 1 tablet (81 mg total) by mouth daily. 10/14/17  Yes Isaiah Serge, NP  atorvastatin (LIPITOR) 80 MG tablet Take 1 tablet (80 mg total) by mouth daily at 6 PM. 10/14/17  Yes Isaiah Serge, NP  CALCIUM PO Take 1 tablet by mouth daily.   Yes [provider]  Cholecalciferol (VITAMIN D3) 2000 units capsule Take 2,000 Units by mouth daily.    Yes [provider]  clopidogrel (PLAVIX) 75 MG tablet Take 75 mg by mouth daily.   Yes [provider]  DULoxetine (CYMBALTA) 60 MG capsule Take 60 mg by mouth daily.  12/05/16  Yes [provider]  EMGALITY 120 MG/ML SOAJ Inject 120 mg into the skin every 30 (thirty) days.  07/30/17  Yes [provider]  fludrocortisone (FLORINEF) 0.1 MG tablet Take 1 tablet (0.1 mg total) by mouth 2 (two) times daily. 10/26/17  Yes Neva Seat, MD  HYDROcodone-acetaminophen (NORCO/VICODIN) 5-325 MG tablet Take 1 tablet by mouth every 6 (six) hours as needed for pain. 12/15/17  Yes [provider]  hydrocortisone (CORTEF) 5 MG tablet Take 5-15 mg by mouth See admin instructions. Take 15 mg by mouth in the morning and 5 mg in the afternoon   Yes [provider]  Insulin Aspart, w/Niacinamide, (FIASP FLEXTOUCH) 100 UNIT/ML SOPN Inject 2-14 Units into the skin See admin instructions. Use three times day as needed before meals per sliding scale   Yes [provider]  insulin  degludec (TRESIBA FLEXTOUCH) 100 UNIT/ML SOPN FlexTouch Pen Inject 40 Units into the skin at bedtime.  08/28/16  Yes [provider]  ipratropium-albuterol (DUONEB) 0.5-2.5 (3) MG/3ML SOLN Take 3 mLs by nebulization 4 (four) times daily.   Yes [provider]  Iron-FA-B Cmp-C-Biot-Probiotic (FUSION PLUS) CAPS Take 1 capsule by mouth daily. 10/09/16  Yes [provider]  levothyroxine (SYNTHROID, LEVOTHROID) 150 MCG tablet Take 150 mcg by mouth daily before breakfast.   Yes [provider]  Magnesium 400 MG TABS Take 400 mg by mouth daily.    Yes [provider]  metoprolol succinate (TOPROL-XL) 25 MG 24 hr tablet Take 0.5 tablets (12.5 mg total) by mouth daily. 10/27/17  Yes Ledell Noss, MD  nitroGLYCERIN (NITROSTAT) 0.4 MG SL tablet Place 0.4 mg under the tongue every 5 (five) minutes as needed for chest pain.  Yes [provider]  oxybutynin (DITROPAN XL) 15 MG 24 hr tablet Take 15 mg by mouth at bedtime.   Yes [provider]  oxyCODONE (OXY IR/ROXICODONE) 5 MG immediate release tablet Take 5 mg by mouth every 4 (four) hours as needed for pain. 12/08/17  Yes [provider]  polyethylene glycol (MIRALAX / GLYCOLAX) packet Take 17 g by mouth daily. 12/27/17  Yes Masoudi, Elhamalsadat, MD  potassium chloride SA (K-DUR,KLOR-CON) 20 MEQ tablet Take 1 tablet (20 mEq total) by mouth daily. 03/14/17  Yes Park Liter, MD  pramipexole (MIRAPEX) 1 MG tablet Take 1 mg by mouth 2 (two) times daily.   Yes [provider]  promethazine (PHENERGAN) 12.5 MG tablet Take 12.5 mg by mouth every 6 (six) hours as needed for nausea. 12/12/17  Yes [provider]  torsemide (DEMADEX) 20 MG tablet Take 2 tablets (40 mg total) by mouth 2 (two) times daily. 10/21/17  Yes Alphonzo Grieve, MD  TRELEGY ELLIPTA 100-62.5-25 MCG/INH AEPB Inhale 1 puff into the lungs daily. 08/30/17  Yes [provider]  TRULICITY 1.5 VQ/2.5ZD SOPN  Inject 1.5 mg into the skin every Sunday.  10/10/16  Yes [provider]    Current Facility-Administered Medications  Medication Dose Route Frequency Provider Last Rate Last Dose  . 0.9 %  sodium chloride infusion   Intravenous Continuous Parrett, Tammy S, NP   Stopped at 01/05/18 1459  . aspirin chewable tablet 81 mg  81 mg Oral Daily Lucious Groves, DO   81 mg at 01/12/18 0919  . atorvastatin (LIPITOR) tablet 80 mg  80 mg Oral q1800 Lucious Groves, DO   80 mg at 01/11/18 1839  . budesonide (PULMICORT) nebulizer solution 0.5 mg  0.5 mg Nebulization BID Parrett, Tammy S, NP   0.5 mg at 01/12/18 0732  . clopidogrel (PLAVIX) tablet 75 mg  75 mg Oral Daily Joni Reining C, DO   75 mg at 01/12/18 0919  . collagenase (SANTYL) ointment   Topical Daily Parrett, Tammy S, NP      . DULoxetine (CYMBALTA) DR capsule 60 mg  60 mg Oral Daily Ina Homes, MD   60 mg at 01/12/18 0920  . feeding supplement (PRO-STAT SUGAR FREE 64) liquid 30 mL  30 mL Oral BID Katherine Roan, MD   30 mL at 01/12/18 0919  . fludrocortisone (FLORINEF) tablet 0.1 mg  0.1 mg Oral Daily Joni Reining C, DO   0.1 mg at 01/12/18 0919  . heparin injection 5,000 Units  5,000 Units Subcutaneous Q8H Parrett, Tammy S, NP   5,000 Units at 01/10/18 1550  . hydrocortisone (CORTEF) tablet 15 mg  15 mg Oral q morning - 10a Collene Gobble, MD   15 mg at 01/12/18 0918  . hydrocortisone (CORTEF) tablet 5 mg  5 mg Oral QPM Collene Gobble, MD   5 mg at 01/11/18 1839  . insulin aspart (novoLOG) injection 0-15 Units  0-15 Units Subcutaneous TID WC Parrett, Tammy S, NP   8 Units at 01/12/18 0917  . insulin aspart (novoLOG) injection 5 Units  5 Units Subcutaneous TID WC Masoudi, Elhamalsadat, MD   5 Units at 01/12/18 1241  . insulin glargine (LANTUS) injection 20 Units  20 Units Subcutaneous Daily Parrett, Tammy S, NP   20 Units at 01/12/18 0917  . ipratropium-albuterol (DUONEB) 0.5-2.5 (3) MG/3ML nebulizer solution 3 mL  3 mL  Nebulization Q4H PRN Lucious Groves, DO      .  levothyroxine (SYNTHROID, LEVOTHROID) tablet 150 mcg  150 mcg Oral QAC breakfast Joni Reining C, DO   150 mcg at 01/12/18 0919  . magnesium oxide (MAG-OX) tablet 400 mg  400 mg Oral Daily Joni Reining C, DO   400 mg at 01/12/18 0919  . MEDLINE mouth rinse  15 mL Mouth Rinse BID Parrett, Tammy S, NP   15 mL at 01/12/18 0921  . metoprolol succinate (TOPROL-XL) 24 hr tablet 12.5 mg  12.5 mg Oral Daily Parrett, Tammy S, NP   12.5 mg at 01/12/18 0920  . oxyCODONE-acetaminophen (PERCOCET/ROXICET) 5-325 MG per tablet 1 tablet  1 tablet Per Tube Q4H PRN Parrett, Tammy S, NP   1 tablet at 01/11/18 2011  . potassium chloride SA (K-DUR,KLOR-CON) CR tablet 40 mEq  40 mEq Oral Q4H Alphonzo Grieve, MD   40 mEq at 01/12/18 0919  . sodium chloride (OCEAN) 0.65 % nasal spray 1 spray  1 spray Each Nare PRN Masoudi, Elhamalsadat, MD      . torsemide (DEMADEX) tablet 20 mg  20 mg Oral Daily Katherine Roan, MD   20 mg at 01/12/18 0920    Allergies as of 01/04/2018 - Review Complete 01/04/2018  Allergen Reaction Noted  . Hydroxychloroquine Shortness Of Breath, Nausea Only, and Other (See Comments) 11/21/2013  . Donepezil Other (See Comments) 10/16/2013  . Prednisone Other (See Comments) 09/02/2013  . Anticoagulant cit dext [acd formula a] Other (See Comments) 11/03/2017  . Bupropion Other (See Comments) 07/15/2016  . Metrizamide Other (See Comments) 10/05/2016  . Varenicline Other (See Comments) 07/15/2016  . Tape Rash and Other (See Comments) 10/12/2016    Family History  Problem Relation Age of Onset  . Stroke Mother   . Diabetes Father   . Diabetes Sister   . Diabetes Sister   . Diabetes Son     Social History   Socioeconomic History  . Marital status: Married    Spouse name: Not on file  . Number of children: Not on file  . Years of education: Not on file  . Highest education level: Not on file  Occupational History  . Not on file  Social  Needs  . Financial resource strain: Not on file  . Food insecurity:    Worry: Not on file    Inability: Not on file  . Transportation needs:    Medical: Not on file    Non-medical: Not on file  Tobacco Use  . Smoking status: Current Every Day Smoker    Packs/day: 0.75    Years: 44.00    Pack years: 33.00    Types: Cigarettes  . Smokeless tobacco: Never Used  Substance and Sexual Activity  . Alcohol use: Yes    Alcohol/week: 7.0 standard drinks    Types: 7 Shots of liquor per week  . Drug use: Yes    Types: Marijuana    Comment: 10/23/2017 "had 1 ~ 1 month ago; nothing before or since"  . Sexual activity: Yes  Lifestyle  . Physical activity:    Days per week: Not on file    Minutes per session: Not on file  . Stress: Not on file  Relationships  . Social connections:    Talks on phone: Patient refused    Gets together: Patient refused    Attends religious service: Patient refused    Active member of club or organization: Patient refused    Attends meetings of clubs or organizations: Patient refused    Relationship status: Patient refused  .  Intimate partner violence:    Fear of current or ex partner: Patient refused    Emotionally abused: Patient refused    Physically abused: Patient refused    Forced sexual activity: Patient refused  Other Topics Concern  . Not on file  Social History Narrative  . Not on file    Review of Systems: All systems reviewed and negative except where noted in HPI.  Physical Exam: Vital signs in last 24 hours: Temp:  [97.9 F (36.6 C)-98.5 F (36.9 C)] 97.9 F (36.6 C) (10/18 0758) Pulse Rate:  [70-77] 70 (10/18 0758) Resp:  [19] 19 (10/18 0733) BP: (122-136)/(50-63) 124/63 (10/18 0758) SpO2:  [92 %-98 %] 94 % (10/18 0758) Last BM Date: 01/12/18 General:   Alert, obese female in NAD Psych:  Pleasant, cooperative. Normal mood and affect. Eyes:  Pupils equal, sclera clear, no icterus.   Conjunctiva pink. Ears:  Normal auditory  acuity. Nose:  No deformity, discharge,  or lesions. Neck:  Supple; no masses Lungs:  Clear throughout to auscultation.   No wheezes, crackles, or rhonchi.  Heart:  Regular rate and rhythm; no murmurs, no edema Abdomen:  Soft, non-distended, nontender, BS active, no palp mass    Rectal:  No evidence for fissure or culprit external hemorrhoids. A scant amount of watery red blood on exam  Msk:  Symmetrical without gross deformities. . Neurologic:  Alert and  oriented x4;  grossly normal neurologically. Skin:  Intact without significant lesions or rashes..   Intake/Output from previous day: 10/17 0701 - 10/18 0700 In: 247.1 [P.O.:240; IV Piggyback:7.1] Out: 2575 [Urine:2575] Intake/Output this shift: No intake/output data recorded.  Lab Results: Recent Labs    01/11/18 0622 01/11/18 1945 01/12/18 1331  WBC 9.2  --  11.9*  HGB 10.9* 10.6* 10.9*  HCT 36.5 34.9* 36.4  PLT 318  --  273   BMET Recent Labs    01/11/18 0622 01/11/18 1945 01/12/18 0237  NA 142 142 140  K 3.3* 3.8 3.1*  CL 94* 95* 95*  CO2 40* 37* 39*  GLUCOSE 127* 168* 280*  BUN 10 13 18   CREATININE 1.05* 1.20* 1.24*  CALCIUM 9.0 9.4 8.9   LFT Recent Labs    01/12/18 0237  ALBUMIN 2.2*     Studies/Results: No results found.   Tye Savoy, NP-C @  01/12/2018, 3:11 PM

## 2018-01-12 NOTE — Progress Notes (Addendum)
Subjective: Feels better. Reports some shortness of breath when was walking yesterday but it resolved. No c.p. Had some blood per rectum after bowel movement but she says that it is better. No abdominal pain. No dizziness. No more panic attack. *Addendum: Was paged in the after noon about blood per rectum without stool. No abdomina pain, nausea, dizziness with that. But patient is concern.  Objective:  Vital signs in last 24 hours: Vitals:   01/11/18 2338 01/12/18 0733 01/12/18 0758 01/12/18 1653  BP: 136/61  124/63 120/73  Pulse: 77 76 70 76  Resp: 19 19    Temp: 98.5 F (36.9 C)  97.9 F (36.6 C) 97.8 F (36.6 C)  TempSrc: Oral  Oral Oral  SpO2: 92% 94% 94% 98%  Weight:      Height:      Physical Exam: Vital sign reviewed Constitutional: She is oriented to person, place, and time. She appears well-developed and well-nourished. No distress.  HENT:  Head: Normocephalic and atraumatic.  Eyes: EOM are normal.  Cardiovascular: Normal rate, regular rhythm and normal heart sounds.  No murmur heard. Pulmonary/Chest: Effort normal. No respiratory distress. She has slight decreased breath sounds.  Abdominal: Soft. Bowel sounds are normal. There is no tenderness.  Musculoskeletal:       Left lower leg: She exhibits no tenderness.  Neurological: She is alert and oriented to person, place, and time.    Assessment/Plan:  Principal Problem:   Acute on chronic respiratory failure with hypoxia and hypercapnia (HCC) Active Problems:   Adrenal insufficiency (HCC)   Type 2 diabetes mellitus without complication, with long-term current use of insulin (HCC)   Acute on chronic combined systolic and diastolic heart failure (HCC)   Iron deficiency  Acute on chronic respiratory failure with hypoxia and hypercapnia, O2 resuirement improved. Is on 2 li nasal O2. Had some dyspnea when walked in the hall but it resolved in few seconds.    - Improving with diuresis -Keep O2 sat>92  Adrenal  insufficiency:   Has continuous hypokalemia -C/w lower dose of Florinef: 0.1 mg QD  -C/wHydrocortisone 15 mg in AM and 5mg  in PM   T2 DM,without complication: Stable. -C/wwith 5 unit of Novolog 3 times daily with meal -C/w Moderate SSI and Lantus 20 mg QD -Hypoglycemia protocol for CBG,70   Blood per rectum: Had blood loss per rectum today. (without stool) Denies any abdominal pain, nausea vomiting) Vital signs today and last hemoglobin yesterday is stable. Concerning for diverticu bleeding.   -Hold Heparin, ASA and Plavix -SCD for VTE ppx -GI consulted -CBC stat -monitor vs -GI consulted -Monitor CBC, transfusion goal Hb>8  Acute on Chronic combined systolic and diastolic heart failure: Significant improvement with Torsemide No weight recorder for today. But yesterday was 5 kg < last dry weight Yesterday net -2300 cc Still has hypokalemia despite ongoing potassium replacement -Decrease Torsemide to 20 QD today due to hypokalemia, Improved volume status and weight <previous dry weight -Monitor BMP -May discharge home today if hemorrhoid bleeding stable and hypokalemia resolves -Strict intake/output -Daily weight  Hypokalemia and hypomagnesmeia.  Had consistant hypokalemia yesterday despite repletion. Improved today to 3.3  - Check K and Mg this PM and replet as needed -C/w 30 meq K-dur  -Resume home Mg Ox 400 QD  HTN:BP today: 120/73 -c/w Metoprolol succinate 12.5 mg QD  Hypothyroidism:Stable -Continue synthroid 150 mcg QD  COPD:On chronic oxygen at home.Stable, is on 2-3 li nasal O2 at hospital. -Continue with IS -Outpatient pulmonary rehab  is recommended -c/w Duoneb nebulizer 3 times daily -c/w Pulmicort  -Keep O2 % at least 92 % -PT recommended home with home health   CAD s/p stent: Asymptomatic and stable -Held ASA, Plavix today due to rectal bleeding -c/w Lipitor  Panic attackand anxiety:No more panic attack. -Held Alprazolam  on this admission due to respiratory failure -C/w Cymbalta 60 mg QD  Iv f: none Diet: heart healthy VTE ppx: SCD Code: full  Dispo: Anticipated discharge in approximately 1-2 day(s). (Patients developed new rectal bleeding this is being worked up. It may change the discharge date)    Dewayne Hatch, MD 01/12/2018, 5:09 PM Pager: [phone]

## 2018-01-13 DIAGNOSIS — J9621 Acute and chronic respiratory failure with hypoxia: Secondary | ICD-10-CM | POA: Diagnosis not present

## 2018-01-13 DIAGNOSIS — J9622 Acute and chronic respiratory failure with hypercapnia: Secondary | ICD-10-CM | POA: Diagnosis not present

## 2018-01-13 DIAGNOSIS — K625 Hemorrhage of anus and rectum: Secondary | ICD-10-CM | POA: Diagnosis not present

## 2018-01-13 LAB — CBC
HCT: 31.2 % — ABNORMAL LOW (ref 36.0–46.0)
HCT: 32 % — ABNORMAL LOW (ref 36.0–46.0)
HEMOGLOBIN: 9.5 g/dL — AB (ref 12.0–15.0)
Hemoglobin: 9 g/dL — ABNORMAL LOW (ref 12.0–15.0)
MCH: 25.7 pg — ABNORMAL LOW (ref 26.0–34.0)
MCH: 26.5 pg (ref 26.0–34.0)
MCHC: 28.8 g/dL — AB (ref 30.0–36.0)
MCHC: 29.7 g/dL — ABNORMAL LOW (ref 30.0–36.0)
MCV: 89.1 fL (ref 80.0–100.0)
MCV: 89.4 fL (ref 80.0–100.0)
NRBC: 0.2 % (ref 0.0–0.2)
NRBC: 0.3 % — AB (ref 0.0–0.2)
PLATELETS: 252 10*3/uL (ref 150–400)
Platelets: 265 10*3/uL (ref 150–400)
RBC: 3.5 MIL/uL — ABNORMAL LOW (ref 3.87–5.11)
RBC: 3.58 MIL/uL — AB (ref 3.87–5.11)
RDW: 15.1 % (ref 11.5–15.5)
RDW: 15.5 % (ref 11.5–15.5)
WBC: 11.3 10*3/uL — ABNORMAL HIGH (ref 4.0–10.5)
WBC: 11.3 10*3/uL — AB (ref 4.0–10.5)

## 2018-01-13 LAB — GLUCOSE, CAPILLARY
GLUCOSE-CAPILLARY: 188 mg/dL — AB (ref 70–99)
GLUCOSE-CAPILLARY: 125 mg/dL — AB (ref 70–99)
GLUCOSE-CAPILLARY: 124 mg/dL — AB (ref 70–99)
GLUCOSE-CAPILLARY: 189 mg/dL — AB (ref 70–99)
GLUCOSE-CAPILLARY: 83 mg/dL (ref 70–99)

## 2018-01-13 LAB — BASIC METABOLIC PANEL
ANION GAP: 5 (ref 5–15)
BUN: 24 mg/dL — ABNORMAL HIGH (ref 8–23)
CO2: 37 mmol/L — AB (ref 22–32)
Calcium: 9.2 mg/dL (ref 8.9–10.3)
Chloride: 98 mmol/L (ref 98–111)
Creatinine, Ser: 1.13 mg/dL — ABNORMAL HIGH (ref 0.44–1.00)
GFR calc non Af Amer: 51 mL/min — ABNORMAL LOW (ref >60)
GFR, EST AFRICAN AMERICAN: 59 mL/min — AB (ref >60)
GLUCOSE: 102 mg/dL — AB (ref 70–99)
Potassium: 3.9 mmol/L (ref 3.5–5.1)
SODIUM: 140 mmol/L (ref 135–145)

## 2018-01-13 LAB — MAGNESIUM: Magnesium: 1.9 mg/dL (ref 1.7–2.4)

## 2018-01-13 MED ORDER — TORSEMIDE 20 MG PO TABS
20.0000 mg | ORAL_TABLET | Freq: Once | ORAL | Status: AC
  Administered 2018-01-13: 20 mg via ORAL
  Filled 2018-01-13: qty 1

## 2018-01-13 MED ORDER — PRAMIPEXOLE DIHYDROCHLORIDE 0.25 MG PO TABS
1.0000 mg | ORAL_TABLET | Freq: Two times a day (BID) | ORAL | Status: DC
  Administered 2018-01-13 – 2018-01-17 (×8): 1 mg via ORAL
  Filled 2018-01-13 (×8): qty 4

## 2018-01-13 MED ORDER — LORAZEPAM 2 MG/ML IJ SOLN
0.5000 mg | Freq: Once | INTRAMUSCULAR | Status: AC
  Administered 2018-01-13: 0.5 mg via INTRAVENOUS
  Filled 2018-01-13: qty 1

## 2018-01-13 MED ORDER — INSULIN ASPART 100 UNIT/ML ~~LOC~~ SOLN
5.0000 [IU] | Freq: Three times a day (TID) | SUBCUTANEOUS | Status: DC
  Administered 2018-01-13 – 2018-01-17 (×11): 5 [IU] via SUBCUTANEOUS

## 2018-01-13 NOTE — Progress Notes (Signed)
    Progress Note   Subjective  Patient developed respiratory distress overnight and went on BiPAP as of 4 AM Remains on BiPAP now Denies abdominal pain but does feel dyspneic Feels anxious, reminiscent of prior anxiety/panic attacks No further bleeding today but did have scant red blood with bowel movement last night per nursing    Objective   Vital signs in last 24 hours: Temp:  [97.6 F (36.4 C)-97.8 F (36.6 C)] 97.6 F (36.4 C) (10/19 0832) Pulse Rate:  [66-125] 71 (10/19 1118) Resp:  [17-55] 55 (10/19 1118) BP: (109-128)/(56-73) 111/56 (10/19 1118) SpO2:  [95 %-100 %] 98 % (10/19 1118) FiO2 (%):  [40 %] 40 % (10/19 0748) Last BM Date: 01/12/18 General:    white female lying in bed with BiPAP mask Heart:  Regular rate and rhythm; no murmurs Lungs: Coarse bilaterally Abdomen:  Soft, obese, nontender and nondistended. Normal bowel sounds. Extremities: Trace edema bilaterally Neurologic:  Alert and oriented,  grossly normal neurologically. Psych:  Cooperative. Normal mood and affect.  Intake/Output from previous day: 10/18 0701 - 10/19 0700 In: 600 [P.O.:600] Out: 1400 [Urine:1400] Intake/Output this shift: No intake/output data recorded.  Lab Results: Recent Labs    01/11/18 0622 01/11/18 1945 01/12/18 1331 01/13/18 0303  WBC 9.2  --  11.9* 11.3*  HGB 10.9* 10.6* 10.9* 9.0*  HCT 36.5 34.9* 36.4 31.2*  PLT 318  --  273 252   BMET Recent Labs    01/11/18 1945 01/12/18 0237 01/13/18 0303  NA 142 140 140  K 3.8 3.1* 3.9  CL 95* 95* 98  CO2 37* 39* 37*  GLUCOSE 168* 280* 102*  BUN 13 18 24*  CREATININE 1.20* 1.24* 1.13*  CALCIUM 9.4 8.9 9.2   LFT Recent Labs    01/12/18 0237  ALBUMIN 2.2*      Assessment / Plan:   62 year old female with CAD, CHF, COPD, CKD, diabetes, history of diverticulosis status post sigmoidectomy, history of SBO admitted with acute on chronic respiratory failure, CHF exacerbation and AKI we were consulted for rectal  bleeding and dropping hemoglobin  1. Rectal bleeding/hx of sigmoidectomy and diverticulosis --plan was to pursue colonoscopy perhaps tomorrow but this will not be feasible given respiratory distress this morning.  Patient remains on BiPAP Hemoglobin has declined to 9.0 without overt bleeding. Watch hemoglobin closely, may need blood transfusion We will plan for colonoscopy once respiratory status improves and patient stable for sedation Discussed with patient and her husband who is at bedside Continue to hold Plavix; stopped 01/11/2018   Principal Problem:   Acute on chronic respiratory failure with hypoxia and hypercapnia (HCC) Active Problems:   Adrenal insufficiency (HCC)   Type 2 diabetes mellitus without complication, with long-term current use of insulin (HCC)   Acute on chronic combined systolic and diastolic heart failure ([place])   Iron deficiency     LOS: 9 days   Lajuan Lines Willliam Pettet  01/13/2018, 11:57 AM

## 2018-01-13 NOTE — Progress Notes (Signed)
Physical Therapy Treatment Patient Details Name: Crystal Yates MRN: [id number] DOB: 1955/11/30 Today's Date: 01/13/2018    History of Present Illness Pt adm with recurrent acute respiratory failure and acute on chronic heart failure. Intubated 10/10-10/11. PMH - chf, cad, dm, htn, obesity, copd, ckd, laparotomy for SBO    PT Comments    Patient limited this visit due to fatigue, as she reports she had no sleep last night. Ambulating shorter distances this session, requiring 3L to remain above 90% SpO2. Will follow early this week to progress ambulation and attempt stair training.     Follow Up Recommendations  Home health PT;Supervision - Intermittent     Equipment Recommendations  None recommended by PT    Recommendations for Other Services       Precautions / Restrictions Precautions Precautions: None Restrictions Weight Bearing Restrictions: No    Mobility  Bed Mobility               General bed mobility comments: OOB at entry  Transfers Overall transfer level: Needs assistance Equipment used: Rolling walker (2 wheeled) Transfers: Sit to/from Stand Sit to Stand: Min guard         General transfer comment: Close guard for safety but no physical asssit required.   Ambulation/Gait Ambulation/Gait assistance: Min guard;Supervision Gait Distance (Feet): 70 Feet Assistive device: Rolling walker (2 wheeled) Gait Pattern/deviations: Step-through pattern;Decreased stride length Gait velocity: decreased   General Gait Details: decreased distance pt reports fatigue from last night. SpO2 88 on 2L with walking. DOE 1/4   Stairs             Wheelchair Mobility    Modified Rankin (Stroke Patients Only)       Balance                                            Cognition Arousal/Alertness: Awake/alert Behavior During Therapy: WFL for tasks assessed/performed Overall Cognitive Status: Within Functional Limits for tasks  assessed                                        Exercises      General Comments        Pertinent Vitals/Pain Pain Assessment: No/denies pain    Home Living                      Prior Function            PT Goals (current goals can now be found in the care plan section) Acute Rehab PT Goals Patient Stated Goal: return home PT Goal Formulation: With patient/family Time For Goal Achievement: 01/20/18 Potential to Achieve Goals: Good Progress towards PT goals: Progressing toward goals    Frequency    Min 3X/week      PT Plan Current plan remains appropriate    Co-evaluation              AM-PAC PT "6 Clicks" Daily Activity  Outcome Measure  Difficulty turning over in bed (including adjusting bedclothes, sheets and blankets)?: A Little Difficulty moving from lying on back to sitting on the side of the bed? : Unable Difficulty sitting down on and standing up from a chair with arms (e.g., wheelchair, bedside commode, etc,.)?: Unable Help needed moving to  and from a bed to chair (including a wheelchair)?: A Little Help needed walking in hospital room?: A Little Help needed climbing 3-5 steps with a railing? : A Little 6 Click Score: 14    End of Session Equipment Utilized During Treatment: Gait belt;Oxygen Activity Tolerance: Patient tolerated treatment well Patient left: in bed;with call bell/phone within reach;with family/visitor present Nurse Communication: Mobility status PT Visit Diagnosis: Other abnormalities of gait and mobility (R26.89)     Time: 3159-4585 PT Time Calculation (min) (ACUTE ONLY): 17 min  Charges:  $Gait Training: 8-22 mins                    Reinaldo Berber, PT, DPT Acute Rehabilitation Services Pager: [phone] Office: [place] 01/13/2018, 4:50 PM

## 2018-01-13 NOTE — Progress Notes (Signed)
Subjective: Patient was seen and evaluated at bedside on morning rounds. Overnight, she got short of breath around 5 AM, when she went to bathroom to pee.  put on BiPAP. She says she does not feel very well. Asks for some thing for her anxiety.  Objective:  Vital signs in last 24 hours: Vitals:   01/13/18 0458 01/13/18 0500 01/13/18 0748 01/13/18 0832  BP:    (!) 109/56  Pulse:  (!) 125 (!) 125 70  Resp:  (!) 24 (!) 21 20  Temp:    97.6 F (36.4 C)  TempSrc:    Oral  SpO2: 96% 96% 97% 100%  Weight:      Height:        BMP Latest Ref Rng & Units 01/13/2018 01/12/2018 01/11/2018  Glucose 70 - 99 mg/dL 102(H) 280(H) 168(H)  BUN 8 - 23 mg/dL 24(H) 18 13  Creatinine 0.44 - 1.00 mg/dL 1.13(H) 1.24(H) 1.20(H)  BUN/Creat Ratio 12 - 28 - - -  Sodium 135 - 145 mmol/L 140 140 142  Potassium 3.5 - 5.1 mmol/L 3.9 3.1(L) 3.8  Chloride 98 - 111 mmol/L 98 95(L) 95(L)  CO2 22 - 32 mmol/L 37(H) 39(H) 37(H)  Calcium 8.9 - 10.3 mg/dL 9.2 8.9 9.4   CBC Latest Ref Rng & Units 01/13/2018 01/12/2018 01/11/2018  WBC 4.0 - 10.5 K/uL 11.3(H) 11.9(H) -  Hemoglobin 12.0 - 15.0 g/dL 9.0(L) 10.9(L) 10.6(L)  Hematocrit 36.0 - 46.0 % 31.2(L) 36.4 34.9(L)  Platelets 150 - 400 K/uL 252 273 -   Physical exam: General: She is on BiPAP. Alet and oriented. CV: RRR, no murmur Lung and chest: Positioning for lung exam was limited due to being ob Bi PAP. Had some wheezing and some rale On ant chest exam Abdomen: Is soft and non tender. No tenderness. BS are present Extremities: 1+ bilateral pitting edema. Pulses are normal and palpable bilaterally Neurologic: Is alert and oriented x 3. No gross neurologic deficit Psychologic exam: Patient report being anxious   Assessment/Plan:  Principal Problem:   Acute on chronic respiratory failure with hypoxia and hypercapnia (HCC) Active Problems:   Adrenal insufficiency (HCC)   Type 2 diabetes mellitus without complication, with long-term current use of insulin  (HCC)   Acute on chronic combined systolic and diastolic heart failure (HCC)   Iron deficiency   Acute on chronic respiratory failure with hypoxia and hypercapnia:  With Hx of COPD, on O2 nasal at home, recent abdominal Hx (diaphragmatic weakness?) Was extubated and stable since early this AM when she developed shortness of breath when she went to bathroom to pee (similar to her panic attacks pattern at home).  Has been on BiPAP since this AM O2 sat is 100%.  Uncertain about what is the reason for her episodic shortness of breath and sudden drop of O2sat. How ever, per her Hx and night team report, and my today evaluation at bedside, beside chronic COPD, chronic hypercapnia and Hx of CHF, there is probably an anxiety-panic attack component involved.    -Ativan 0.5 mg IV -If feels better, will try to take her off of Bi-PAP and reevaluate her on nasal O2. -Cardiac monitoring  Blood per rectum: Concerning for diverticu bleeding vs hemorrhoid bleeding Reports minor bleeding per rectum without defication. Has passed gas but no stool. Vital stable, abdominal exam is normal. VS stable  Hb dropped to 9 today from 10.9 yesterday afternoon.  She will take benefit of getting colonoscopy.  Gi is on board. Planned to  do colonoscopy, when stable off the BiPAP.  -Keep Holding ASA and Plavix, and Heparin -Monitor CBC, transfusion goal Hb>8  Adrenal insufficiency:   -C/w Florinef: 0.1 mg QD -C/w Hydrocortisone 15 mg in AM and 5mg  in PM    B3HD,IXBOERQ complication:Stable. May adjust if remains NPO -C/wwith 5 unit of Novolog 3 times daily with meal  -C/w Moderate SSI and Lantus 20 mg QD -Hypoglycemia protocol for CBG,70   Acute on Chronic combined systolic and diastolic heart failure: Has some rale on ant chest exam.  Significant improvement with Torsemide. Dose decreased yesterday.  Has some rale on ant chest exam. No LEE. No weight recorded for last 2 days Yesterday net -1040  cc K normalized today  May need one extra dose of Torsemide.  -Will continue Torsemide with extra dose today -Monitor BMP -Strict intake/output -Daily weight (no weight in the chart in past few 2 days)  Hypokalemia and hypomagnesmeia.Normal today -Check K and Mg daily -C/w30 meq K-dur -C/w home Mg Ox 400 QD  HTN:BP today: 109/56 -c/w Metoprolol succinate 12.5 mg QD  Hypothyroidism:Stable -Continue synthroid 150 mcg QD  COPD:On BiPAP now due to sob overnight -Continue with IS after off of BiPAP -Outpatient pulmonary rehab is recommended -c/w Duoneb nebulizer 3 times daily and Pulmicort -PTrecommendedhome with home health after DC  CAD s/p stent:Asymptomatic and stable -Keeo holding ASA, Plavix today due to rectal bleeding -c/w Lipitor  Panic attackand anxiety: Reports that she is very anxcious. Had an episode last night - low dose of Ativan 0.5 mg IV once -C/w Cymbalta 60 mg QD  Iv f: none Diet: heart healthy VTE ppx: SCD Code: full  Dispo: Patient has new ongoing medical issues,  Anticipated discharge in approximately 2-3 day(s),  Dewayne Hatch, MD 01/13/2018, 10:40 AM Pager: [phone]

## 2018-01-13 NOTE — Plan of Care (Signed)
Discussed with patient plan of care for the evening, possible discharge and pain management with some teach back displayed.  Patient understood why she is getting a different medication for restless leg syndrome at this time.

## 2018-01-13 NOTE — Progress Notes (Signed)
Successfully toke the patient off of BiPAP and put her back on nasal O2, 30 min after giving 0.5 mg of Ativan I saw and evaluated patient after that. She is sleeping. In no acute distress. On 6 lit nasal O2, O2 sat is100% Husband is in the room and endorses that she has been doing well with nasal O2. Decreased the O2 level to 5 li and then 4 lit, O2 sat remained stable at 95% as I observed her for 10 minutes. Will monitor her.  Crystal Yates, Internal Medicine PGY-1

## 2018-01-13 NOTE — Progress Notes (Signed)
Patients family at bedside declined Pulmicort treatment and NIV at this time stating that he wanted her to rest right now and not be disturbed. No distress noted at this time.

## 2018-01-13 NOTE — Progress Notes (Addendum)
Paged by RN about patient in respiratory distress. Per RN and respiratory therapist was observed in severe agitation with diaphoresis and dyspnea with increased work of breathing and significant wheezing despite duoneb treatment at 5am. Was observed tachypneic with desaturation event down to mid 80s. On evaluation had bipap mask on. Patient describes episode of panic attack and anxiety while walking to the bathroom consistent with her prior episodes of panic attacks.   Gen: Anxious appearing with BiPap mask on HEENT: NCAT head, hearing intact, EOMI, PERRL, No nasal discharge, MMM Neck: supple, ROM intact, no JVD, no cervical adenopathy CV: Tachycardic, regular rhythm, S1, S2 normal, No rubs, no murmurs, no gallops Pulm: CTAB, no wheeze, no obvious rales Abd: Soft, BS+, NTND, No rebound, no guarding Extm: ROM intact, Peripheral pulses intact, no lower extremity edema  Respiratory distress 2/2 panic attack vs COPD exacerbation She had been diuresing well over last 3 days on torsemide w/ consistent weight loss. Not suspecting pulmonary edema. Most likely acute respiratory distress 2/2 panic attack in setting of multiple comorbidity. Had duoneb treatment x1 prior to episode. Put on BiPAP per respiratory therapy. Patient endorse improvement in anxiety with BiPAP. Will observe for now.

## 2018-01-13 NOTE — Progress Notes (Signed)
Patient had increased WOB plus became diaphoretic, placed on NIV at this time, tolerating well as documented.

## 2018-01-14 DIAGNOSIS — E2740 Unspecified adrenocortical insufficiency: Secondary | ICD-10-CM | POA: Diagnosis not present

## 2018-01-14 DIAGNOSIS — R319 Hematuria, unspecified: Secondary | ICD-10-CM

## 2018-01-14 DIAGNOSIS — K625 Hemorrhage of anus and rectum: Secondary | ICD-10-CM | POA: Diagnosis not present

## 2018-01-14 DIAGNOSIS — J9621 Acute and chronic respiratory failure with hypoxia: Secondary | ICD-10-CM | POA: Diagnosis not present

## 2018-01-14 DIAGNOSIS — J9622 Acute and chronic respiratory failure with hypercapnia: Secondary | ICD-10-CM | POA: Diagnosis not present

## 2018-01-14 DIAGNOSIS — Z960 Presence of urogenital implants: Secondary | ICD-10-CM

## 2018-01-14 LAB — BASIC METABOLIC PANEL
Anion gap: 8 (ref 5–15)
BUN: 23 mg/dL (ref 8–23)
CALCIUM: 9.1 mg/dL (ref 8.9–10.3)
CHLORIDE: 97 mmol/L — AB (ref 98–111)
CO2: 37 mmol/L — ABNORMAL HIGH (ref 22–32)
Creatinine, Ser: 1.09 mg/dL — ABNORMAL HIGH (ref 0.44–1.00)
GFR, EST NON AFRICAN AMERICAN: 53 mL/min — AB (ref >60)
GLUCOSE: 75 mg/dL (ref 70–99)
POTASSIUM: 3.3 mmol/L — AB (ref 3.5–5.1)
SODIUM: 142 mmol/L (ref 135–145)

## 2018-01-14 LAB — CBC
HEMATOCRIT: 31.8 % — AB (ref 36.0–46.0)
Hemoglobin: 9.6 g/dL — ABNORMAL LOW (ref 12.0–15.0)
MCH: 27 pg (ref 26.0–34.0)
MCHC: 30.2 g/dL (ref 30.0–36.0)
MCV: 89.6 fL (ref 80.0–100.0)
Platelets: 253 10*3/uL (ref 150–400)
RBC: 3.55 MIL/uL — AB (ref 3.87–5.11)
RDW: 15.4 % (ref 11.5–15.5)
WBC: 10.8 10*3/uL — AB (ref 4.0–10.5)
nRBC: 0.2 % (ref 0.0–0.2)

## 2018-01-14 LAB — URINALYSIS, ROUTINE W REFLEX MICROSCOPIC
Bilirubin Urine: NEGATIVE
Glucose, UA: NEGATIVE mg/dL
Ketones, ur: NEGATIVE mg/dL
Nitrite: POSITIVE — AB
Protein, ur: NEGATIVE mg/dL
RBC / HPF: >50 RBC/hpf — ABNORMAL HIGH (ref 0–5)
Specific Gravity, Urine: 1.006 (ref 1.005–1.030)
WBC, UA: >50 WBC/hpf — ABNORMAL HIGH (ref 0–5)
pH: 7 (ref 5.0–8.0)

## 2018-01-14 LAB — GLUCOSE, CAPILLARY
GLUCOSE-CAPILLARY: 72 mg/dL (ref 70–99)
GLUCOSE-CAPILLARY: 169 mg/dL — AB (ref 70–99)
Glucose-Capillary: 152 mg/dL — ABNORMAL HIGH (ref 70–99)
Glucose-Capillary: 167 mg/dL — ABNORMAL HIGH (ref 70–99)

## 2018-01-14 LAB — MAGNESIUM: Magnesium: 1.7 mg/dL (ref 1.7–2.4)

## 2018-01-14 MED ORDER — TORSEMIDE 20 MG PO TABS
20.0000 mg | ORAL_TABLET | Freq: Once | ORAL | Status: AC
  Administered 2018-01-14: 20 mg via ORAL
  Filled 2018-01-14: qty 1

## 2018-01-14 MED ORDER — POTASSIUM CHLORIDE CRYS ER 20 MEQ PO TBCR
40.0000 meq | EXTENDED_RELEASE_TABLET | Freq: Once | ORAL | Status: AC
  Administered 2018-01-14: 40 meq via ORAL
  Filled 2018-01-14: qty 2

## 2018-01-14 MED ORDER — POTASSIUM CHLORIDE CRYS ER 20 MEQ PO TBCR
40.0000 meq | EXTENDED_RELEASE_TABLET | Freq: Every day | ORAL | Status: DC
  Administered 2018-01-15: 40 meq via ORAL
  Filled 2018-01-14: qty 2

## 2018-01-14 MED ORDER — POTASSIUM CHLORIDE CRYS ER 20 MEQ PO TBCR
20.0000 meq | EXTENDED_RELEASE_TABLET | Freq: Two times a day (BID) | ORAL | Status: AC
  Administered 2018-01-14 (×2): 20 meq via ORAL
  Filled 2018-01-14 (×2): qty 1

## 2018-01-14 MED ORDER — TORSEMIDE 20 MG PO TABS
40.0000 mg | ORAL_TABLET | Freq: Every day | ORAL | Status: DC
  Administered 2018-01-15: 40 mg via ORAL
  Filled 2018-01-14 (×2): qty 2

## 2018-01-14 NOTE — Progress Notes (Signed)
We are consulted for rectal bleeding. Pt with respiratory distress yesterday requiring BiPap.  Now off BiPap. We will consider colonoscopy when medically appropriate.  We will check on the patient tomorrow. Please call with any questions for GI before Monday, 01/15/2018

## 2018-01-14 NOTE — Progress Notes (Addendum)
Patient refused tx at this time, no distress noted at this time. Patient refused NIV as well, no distress noted.

## 2018-01-14 NOTE — Progress Notes (Signed)
Paged for six run of Vtach on tele. Nurse states patient was awake and asymptomatic at the time. On visit to room Mrs. Farewell denies CP or current SOB. Two episodes of irregular rate also shown on tele without RVR with p-waves present. RRR on physical exam. Labs recently returned show hypokalemia of 3.3, likely secondary to torsemide.   - monitor and requested page if patient becomes symptomatic - replete K   Kiasha Bellin A, DO 01/14/2018, 3:55 AM Pager: [phone]

## 2018-01-14 NOTE — Progress Notes (Signed)
RN reported to MD that pt is starting to have blood in her urine. She stated she would follow up with if foley catheter should be removed. RN will continue to monitor.   Pt has also tolerated being off BiPAP so diet advanced to heart healthy.  Pt asked RN if she will have colonoscopy done tomorrow. RN reported that based off GI MD note it did not sound like it. RN encouraged pt to ask GI MD tomorrow plan / timeline for when it will be done. Pt verbalized understanding.   RN will continue to monitor pt.

## 2018-01-14 NOTE — Progress Notes (Signed)
Subjective: Patient seen and evaluated this morning. Is on nasal O2. No episode of panic attack or shortness of breath. Had 1 bowel movement this morning with no blood. No acute complaints.  Objective:  Vital signs in last 24 hours: Vitals:   01/14/18 0300 01/14/18 0724 01/14/18 0744 01/14/18 1147  BP: (!) 107/51  116/67 (!) 107/55  Pulse: 70 70 71 69  Resp: 18 18 19  (!) 24  Temp: 97.9 F (36.6 C)  98 F (36.7 C) 98 F (36.7 C)  TempSrc: Oral  Oral Oral  SpO2: 96% 95% 94% 98%  Weight:      Height:       Physical Exam  Constitutional: She is oriented to person, place, and time. She appears well-developed and well-nourished. No distress.  Eyes: EOM are normal.  Cardiovascular: Normal rate, regular rhythm and normal heart sounds.  No murmur heard. Pulmonary/Chest: On nasal O2.  Effort normal. Decreased breath sound. No respiratory distress. She has no wheezes. She has no rales.  Abdominal: Soft. Bowel sounds are normal. She exhibits no distension. Mild  tenderness.  Musculoskeletal: She exhibits 1+ bilateral pitting edema. Neurological: She is alert and oriented to person, place, and time.   Assessment/Plan:  Principal Problem:   Acute on chronic respiratory failure with hypoxia and hypercapnia (HCC) Active Problems:   Adrenal insufficiency (HCC)   Type 2 diabetes mellitus without complication, with long-term current use of insulin (HCC)   Acute on chronic combined systolic and diastolic heart failure (HCC)   Iron deficiency   Acute on chronic respiratory failure with hypoxia and hypercapnia:  With Hx of COPD, on O2 nasal at home, recent abdominal Hx (diaphragmatic weakness?)  Improvd. No SOB on nasal O2. Has be with O2 sat 95% on 3 li nasal O2 today. Uncertain about what is the reason for her episodic shortness of breath and sudden drop of O2sat. How ever, per her Hx and night team report, and my today evaluation at bedside, beside chronic COPD, chronic hypercapnia and  Hx of CHF, there is probably an anxiety-panic attack component involved.   -C/w Torsemide. Increased dose to 40 mg QD. -Cardiac monitoring  Blood per rectum: No more bleeding. Hb has been stable. Today Hb:9.6 Gi is on board and planned for colonoscopy may in following days if medically remains stable.  -Keep Holding ASA and Plavix, and Heparin -Monitor CBC, transfusion goal Hb>8 -f/u GI for colonoscopy plan  Adrenal insufficiency: Patient mentions that her endocrinologist decided not to put her Florinef. We have decreased the dose and recommend to f/u with him out patient.  -C/w Florinef:0.1 mg QD -C/w Hydrocortisone 15 mg in AM and 5mg  in PM  -F/u with endocrinologist outpatient for medications plan   [id number],[id number] complication:Stable. -C/wwith 5 unit of Novolog 3 times daily with meal  -C/w Moderate SSI and Lantus 20 mg QD -Hypoglycemia protocol for CBG,70   Acute on Chronic combined systolic and diastolic heart failure: Has some rale on ant chest exam.  Significant improvement with Torsemide.  Weight 220 lb (last dry weight 225) Almost euvolemic Yesterday net -860 cc(not sure if it is correct)  -Will continue Torsemide at 40 mg QD and reevaluate her tomorrow -Monitor BMP -Strict intake/output -Daily weight (no weight in the chart in past few 2 days)  Hypokalemia: likely 2/2 Torsemide K:3.1 today (repleted at night). Mg:1.6 -Check K and Mg daily -C/w30 meq K-dur -C/w home Mg Ox 400 QD  HTN:BP today: 107/55 -c/w Metoprolol succinate 12.5 mg QD  Hypothyroidism:Stable -Continue synthroid 150 mcg QD  COPD:On 2 lit nasal O2 at home. Stable -Continue with Duoneb nebulizer, Pulmicort and IS  -Outpatient pulmonary rehab is recommended -PTrecommendedhome with home health after DC  CADs/p stent:Asymptomatic and stable -Keeo holdingASA, Plavixtoday due to rectal bleeding -c/wLipitor  Panic attackand anxiety: Reports that she is  very anxcious. Had an episode last night - low dose of Ativan 0.5 mg IV once -C/w Cymbalta 60 mg QD  Iv f:none Diet: heart healthy VTE ppx:SCD Code:full  Dispo: Anticipated discharge in approximately 2-3 days  Dewayne Hatch, MD 01/14/2018, 2:25 PM Pager: [phone]

## 2018-01-15 DIAGNOSIS — K5730 Diverticulosis of large intestine without perforation or abscess without bleeding: Secondary | ICD-10-CM | POA: Diagnosis not present

## 2018-01-15 DIAGNOSIS — J9621 Acute and chronic respiratory failure with hypoxia: Secondary | ICD-10-CM | POA: Diagnosis not present

## 2018-01-15 DIAGNOSIS — K625 Hemorrhage of anus and rectum: Secondary | ICD-10-CM | POA: Diagnosis not present

## 2018-01-15 DIAGNOSIS — Z8719 Personal history of other diseases of the digestive system: Secondary | ICD-10-CM | POA: Diagnosis not present

## 2018-01-15 DIAGNOSIS — K642 Third degree hemorrhoids: Secondary | ICD-10-CM | POA: Diagnosis not present

## 2018-01-15 DIAGNOSIS — D120 Benign neoplasm of cecum: Secondary | ICD-10-CM | POA: Diagnosis not present

## 2018-01-15 DIAGNOSIS — Z980 Intestinal bypass and anastomosis status: Secondary | ICD-10-CM | POA: Diagnosis not present

## 2018-01-15 DIAGNOSIS — J9622 Acute and chronic respiratory failure with hypercapnia: Secondary | ICD-10-CM | POA: Diagnosis not present

## 2018-01-15 DIAGNOSIS — R310 Gross hematuria: Secondary | ICD-10-CM | POA: Diagnosis not present

## 2018-01-15 DIAGNOSIS — D123 Benign neoplasm of transverse colon: Secondary | ICD-10-CM | POA: Diagnosis not present

## 2018-01-15 DIAGNOSIS — K621 Rectal polyp: Secondary | ICD-10-CM | POA: Diagnosis not present

## 2018-01-15 LAB — CBC
HEMATOCRIT: 31.6 % — AB (ref 36.0–46.0)
HEMOGLOBIN: 9.3 g/dL — AB (ref 12.0–15.0)
MCH: 26.5 pg (ref 26.0–34.0)
MCHC: 29.4 g/dL — ABNORMAL LOW (ref 30.0–36.0)
MCV: 90 fL (ref 80.0–100.0)
NRBC: 0 % (ref 0.0–0.2)
Platelets: 244 10*3/uL (ref 150–400)
RBC: 3.51 MIL/uL — AB (ref 3.87–5.11)
RDW: 15.5 % (ref 11.5–15.5)
WBC: 11.4 10*3/uL — ABNORMAL HIGH (ref 4.0–10.5)

## 2018-01-15 LAB — BASIC METABOLIC PANEL
ANION GAP: 6 (ref 5–15)
BUN: 24 mg/dL — ABNORMAL HIGH (ref 8–23)
CHLORIDE: 98 mmol/L (ref 98–111)
CO2: 37 mmol/L — AB (ref 22–32)
Calcium: 9 mg/dL (ref 8.9–10.3)
Creatinine, Ser: 1.12 mg/dL — ABNORMAL HIGH (ref 0.44–1.00)
GFR calc non Af Amer: 52 mL/min — ABNORMAL LOW (ref >60)
GFR, EST AFRICAN AMERICAN: 60 mL/min — AB (ref >60)
Glucose, Bld: 162 mg/dL — ABNORMAL HIGH (ref 70–99)
POTASSIUM: 3.5 mmol/L (ref 3.5–5.1)
Sodium: 141 mmol/L (ref 135–145)

## 2018-01-15 LAB — GLUCOSE, CAPILLARY
GLUCOSE-CAPILLARY: 160 mg/dL — AB (ref 70–99)
GLUCOSE-CAPILLARY: 188 mg/dL — AB (ref 70–99)
GLUCOSE-CAPILLARY: 168 mg/dL — AB (ref 70–99)
GLUCOSE-CAPILLARY: 164 mg/dL — AB (ref 70–99)

## 2018-01-15 MED ORDER — POTASSIUM CHLORIDE CRYS ER 20 MEQ PO TBCR: 40.0000 meq | EXTENDED_RELEASE_TABLET | Freq: Every day | ORAL | Status: DC

## 2018-01-15 MED ORDER — PEG-KCL-NACL-NASULF-NA ASC-C 100 G PO SOLR
1.0000 | Freq: Once | ORAL | Status: DC
  Filled 2018-01-15 (×2): qty 1

## 2018-01-15 MED ORDER — POTASSIUM CHLORIDE CRYS ER 20 MEQ PO TBCR
40.0000 meq | EXTENDED_RELEASE_TABLET | Freq: Once | ORAL | Status: AC
  Administered 2018-01-15: 40 meq via ORAL
  Filled 2018-01-15: qty 2

## 2018-01-15 MED ORDER — POTASSIUM CHLORIDE CRYS ER 20 MEQ PO TBCR
40.0000 meq | EXTENDED_RELEASE_TABLET | Freq: Every day | ORAL | Status: DC
  Administered 2018-01-16 – 2018-01-17 (×2): 40 meq via ORAL
  Filled 2018-01-15 (×2): qty 2

## 2018-01-15 MED ORDER — BISACODYL 5 MG PO TBEC: 10.0000 mg | DELAYED_RELEASE_TABLET | Freq: Every day | ORAL | Status: DC | PRN

## 2018-01-15 MED ORDER — POLYETHYLENE GLYCOL 3350 17 G PO PACK
17.0000 g | PACK | Freq: Every day | ORAL | Status: DC
  Administered 2018-01-15: 17 g via ORAL
  Filled 2018-01-15 (×2): qty 1

## 2018-01-15 MED ORDER — POLYETHYLENE GLYCOL 3350 17 GM/SCOOP PO POWD
1.0000 | Freq: Once | ORAL | Status: AC
  Administered 2018-01-15: 255 g via ORAL
  Filled 2018-01-15: qty 255

## 2018-01-15 MED ORDER — POTASSIUM CHLORIDE CRYS ER 20 MEQ PO TBCR: 40.0000 meq | EXTENDED_RELEASE_TABLET | Freq: Two times a day (BID) | ORAL | Status: DC

## 2018-01-15 NOTE — Progress Notes (Signed)
     [facility] Progress Note   Chief Complaint:  Rectal bleeding    SUBJECTIVE:    feels fine today. Had normal BM without blood. No SOB / chest pain.    ASSESSMENT AND PLAN:   Painless rectal bleeding on plavix and SQ heparin. Admitted last month with same (requiring pRBCs) and felt to be diverticular. Colonoscopy not done as she was recovering from SBO with wound vac. -last colonoscopy was 2015 - tortuous colon, sigmoid diverticulosis. No polyps. Repeat colon in 2016 prior to colon resection (? For diverticular dz).  -we are planning on repeat colonoscopy. Patient developed respiratory distress over weekend, required Bipap. Now back on nasal 02. No SOB -Yesterday am she has six runs of Vtach (asymptomatic). K+ was 3.3, repleted.  -clears today, if stable throughout the day will plan for colonoscopy tomorrow  Normocytic anemia. Transfused in Sept. She presented with hgb of 10.9 this admit. With bleeding it has declined slowly to 9.3  OBJECTIVE:     Vital signs in last 24 hours: Temp:  [97.6 F (36.4 C)-98.8 F (37.1 C)] 97.6 F (36.4 C) (10/21 0756) Pulse Rate:  [69-75] 75 (10/21 0756) Resp:  [17-24] 17 (10/21 0756) BP: (107-144)/(54-74) 144/72 (10/21 0756) SpO2:  [93 %-99 %] 96 % (10/21 0756) Weight:  [97.9 kg] 97.9 kg (10/21 0500) Last BM Date: 01/14/18 General:   Alert female working on laptop in NAD EENT:  Normal hearing, non icteric sclera, conjunctive pink.  Heart:  Regular rate and rhythm; no lower extremity edema Pulm: nasal 02. Normal respiratory effort, lungs CTA bilaterally without wheezes or crackles. Abdomen:  Soft, nondistended, nontender.  Normal bowel sounds, no masses felt.     Neurologic:  Alert and  oriented x4;  grossly normal neurologically. Psych:  Pleasant, cooperative.  Normal mood and affect.   Intake/Output from previous day: 10/20 0701 - 10/21 0700 In: 840 [P.O.:840] Out: 2575 [Urine:2575] Intake/Output this shift: No  intake/output data recorded.  Lab Results: Recent Labs    01/13/18 1626 01/14/18 0220 01/15/18 0253  WBC 11.3* 10.8* 11.4*  HGB 9.5* 9.6* 9.3*  HCT 32.0* 31.8* 31.6*  PLT 265 253 244   BMET Recent Labs    01/13/18 0303 01/14/18 0220 01/15/18 0253  NA 140 142 141  K 3.9 3.3* 3.5  CL 98 97* 98  CO2 37* 37* 37*  GLUCOSE 102* 75 162*  BUN 24* 23 24*  CREATININE 1.13* 1.09* 1.12*  CALCIUM 9.2 9.1 9.0     Principal Problem:   Acute on chronic respiratory failure with hypoxia and hypercapnia (HCC) Active Problems:   Adrenal insufficiency (HCC)   Type 2 diabetes mellitus without complication, with long-term current use of insulin (HCC)   Acute on chronic combined systolic and diastolic heart failure (HCC)   Iron deficiency     LOS: 11 days   Tye Savoy ,NP 01/15/2018, 11:12 AM

## 2018-01-15 NOTE — Progress Notes (Signed)
Subjective: Crystal Yates is doing well.  No shortness of breath on nasal O2.  No panic attack.  No acute events overnight.   Objective:  Vital signs in last 24 hours: Vitals:   01/14/18 1900 01/14/18 2300 01/15/18 0300 01/15/18 0500  BP: (!) 110/54 123/61 135/74   Pulse: 73 73 70   Resp: (!) 21 (!) 21 18   Temp: 97.7 F (36.5 C) 98.8 F (37.1 C) 97.7 F (36.5 C)   TempSrc: Oral Oral Oral   SpO2: 94% 93% 99%   Weight:    97.9 kg  Height:       Physical Exam  Constitutional: Obese lady, she is oriented to person, place, and time. She appears well-developed and well-nourished.  HENT:  Head: Normocephalic and atraumatic.  Eyes: EOM are normal.  Neck: No JVD present.  Cardiovascular: Normal rate, regular rhythm, normal heart sounds and intact distal pulses.  No murmur heard. Pulmonary/Chest: Effort normal. No accessory muscle usage. No tachypnea. No respiratory distress. She has decreased breath sounds. She has no wheezes. She has no rhonchi. She has no rales.  Abdominal: Dressing of previous abdominal surgery is intact.  Abdomen is soft. Bowel sounds are normal. There is no tenderness.  Musculoskeletal:  She exhibits 1 + pitting edema in lower extremities. no tenderness.swelling Neurological: She is alert and oriented to person, place, and time.  Skin: Skin is warm and dry.  Psychiatric: She has a normal mood and affect. Her behavior is normal.   Assessment/Plan:  Principal Problem:   Acute on chronic respiratory failure with hypoxia and hypercapnia (HCC) Active Problems:   Adrenal insufficiency (HCC)   Type 2 diabetes mellitus without complication, with long-term current use of insulin (HCC)   Acute on chronic combined systolic and diastolic heart failure (HCC)   Iron deficiency  Acute on chronic respiratory failure with hypoxia and hypercapnia: Improved.   Shortness of breath: With Hx of COPD, on O2 nasal at home, recent abdominal surgery (diaphragmatic weakness?),   Acute on chronic CHF exacerbation and anxiety related Improvd diuresis torsemide. No SOB on nasal O2. Tolerated decreasing Nasal O2 flow from 3li to 2 lit today. Saturating remained at 95%  Today weight 215 lb (last dry weight to 20) -C/w Torsemide dose to 40 mg QD.  May adjust tomorrow -Continue cardiac monitoring -C/w nasal O2 at 2 li  Blood per rectum: No more bleeding. Hb has been stable. Hb stable (Today Hb:9.3) Probable plan for colonoscopy tomorrow -Keep Holding ASA and Plavix,and Heparin -Monitor CBC, transfusion goal Hb>8 -f/u GI for colonoscopy plan  Gross hematuria through Foley cath yesterday: U/A s positive for large leukocytes, large hemoglobin, bacteria.  -D/C cath -Urine culture  Acute on Chronic combined systolic and diastolic heart failure: Has some rale on ant chest exam. Significant improvement with Torsemide.  Weight 215lb today (last dry weight 225) Almost euvolemic on exam Yesterday net -extend 70 cc(not sure if it is correct)  -Will continue Torsemideat 40 mg QD and reevaluate her tomorrow -Monitor BMP -Strict intake/output -Daily weight(no weight in the chart in past few 2 days)    Hypokalemia: Normal K today. likely 2/2 Torsemide. Is getting daily K-dur  -Check BMPdaily -C/w30 meq K-dur -C/w homeMg Ox 400 QD  HTN:BP today:135/74 -c/w Metoprolol succinate 12.5 mg QD  Hypothyroidism:Stable -Continue synthroid 150 mcg QD  COPD:On 2 lit nasal O2 at home. Stable -Continue with Duoneb nebulizer, Pulmicort and IS -Outpatient pulmonary rehab is recommended -PTrecommendedhome with home healthafter DC  CADs/p  stent:Asymptomatic and stable -Keeo holdingASA, Plavixtoday due to rectal bleeding -c/wLipitor   Adrenal insufficiency: Patient mentions that her endocrinologist decided not to put her Florinef. We have decreased the dose and recommend to f/u with him out patient. Na:141, K:3.5  -C/w Florinef:0.1  mg QD -C/w Hydrocortisone 15 mg in AM and 5mg  in PM  -F/u with endocrinologist outpatient for medications plan   U3BD,HDIXBOE complication:Stable. -C/wwith 5 unit of Novolog 3 times daily with meal  -C/w Moderate SSI and Lantus 20 mg QD -Hypoglycemia protocol for CBG,70  Panic attackand anxiety:No more episode -C/w Cymbalta 60 mg QD  Iv f:none Diet: heart healthy VTE ppx:SCD Code:full   Dispo: Anticipated discharge in approximately 1-2 days  Dewayne Hatch, MD 01/15/2018, 6:15 AM Pager: [phone]

## 2018-01-15 NOTE — Progress Notes (Signed)
Physical Therapy Treatment Patient Details Name: Crystal Yates MRN: [id number] DOB: July 18, 1955 Today's Date: 01/15/2018    History of Present Illness Pt adm with recurrent acute respiratory failure and acute on chronic heart failure. Intubated 10/10-10/11. PMH - chf, cad, dm, htn, obesity, copd, ckd, laparotomy for SBO    PT Comments    Patient received in bed, pleasant and willing to participate in skilled PT session but agreeable to in room session only, stating "if I get SOB today, they won't do my colonoscopy tomorrow". Able to perform bed mobility with S, and functional transfers and gait with RW approximately 77f total in room with S as well, good awareness of safety and lines/leads noted. Spent quite a bit of time on the toilet attempting to have bowel movement and able to perform self care with S from PT. She was left up in the chair with all needs met this afternoon. She remains appropriate for skilled HHPT services upon discharge.     Follow Up Recommendations  Home health PT;Supervision - Intermittent     Equipment Recommendations  None recommended by PT    Recommendations for Other Services       Precautions / Restrictions Precautions Precautions: None Restrictions Weight Bearing Restrictions: No    Mobility  Bed Mobility Overal bed mobility: Needs Assistance Bed Mobility: Supine to Sit     Supine to sit: Supervision     General bed mobility comments: S for safety, no physical assist given   Transfers Overall transfer level: Needs assistance Equipment used: Rolling walker (2 wheeled) Transfers: Sit to/from Stand Sit to Stand: Supervision         General transfer comment: S for safety, no physical assist given   Ambulation/Gait Ambulation/Gait assistance: Supervision Gait Distance (Feet): 30 Feet Assistive device: Rolling walker (2 wheeled) Gait Pattern/deviations: Step-through pattern;Decreased stride length Gait velocity: decreased    General Gait Details: distance limited to in room, patient unwilling to go out in hallway due to stating "if I get SOB today, they won't do my colonscopy tomorrow"    Stairs             Wheelchair Mobility    Modified Rankin (Stroke Patients Only)       Balance Overall balance assessment: Mild deficits observed, not formally tested                                          Cognition Arousal/Alertness: Awake/alert Behavior During Therapy: WFL for tasks assessed/performed Overall Cognitive Status: Within Functional Limits for tasks assessed                                        Exercises      General Comments        Pertinent Vitals/Pain Pain Assessment: No/denies pain    Home Living                      Prior Function            PT Goals (current goals can now be found in the care plan section) Acute Rehab PT Goals Patient Stated Goal: return home PT Goal Formulation: With patient/family Time For Goal Achievement: 01/20/18 Potential to Achieve Goals: Good Progress towards PT goals: Progressing toward goals  Frequency    Min 3X/week      PT Plan Current plan remains appropriate    Co-evaluation              AM-PAC PT "6 Clicks" Daily Activity  Outcome Measure  Difficulty turning over in bed (including adjusting bedclothes, sheets and blankets)?: None Difficulty moving from lying on back to sitting on the side of the bed? : None Difficulty sitting down on and standing up from a chair with arms (e.g., wheelchair, bedside commode, etc,.)?: None Help needed moving to and from a bed to chair (including a wheelchair)?: A Little Help needed walking in hospital room?: A Little Help needed climbing 3-5 steps with a railing? : A Little 6 Click Score: 21    End of Session Equipment Utilized During Treatment: Oxygen Activity Tolerance: Patient tolerated treatment well Patient left: in chair;with call  bell/phone within reach   PT Visit Diagnosis: Other abnormalities of gait and mobility (R26.89)     Time: 1330-1415 PT Time Calculation (min) (ACUTE ONLY): 45 min  Charges:  $Gait Training: 8-22 mins $Therapeutic Activity: 23-37 mins                     Deniece Ree PT, DPT, CBIS  Supplemental Physical Therapist [place]    Pager [phone] Acute Rehab Office [phone]

## 2018-01-15 NOTE — H&P (View-Only) (Signed)
     [facility] Progress Note   Chief Complaint:  Rectal bleeding    SUBJECTIVE:    feels fine today. Had normal BM without blood. No SOB / chest pain.    ASSESSMENT AND PLAN:   Painless rectal bleeding on plavix and SQ heparin. Admitted last month with same (requiring pRBCs) and felt to be diverticular. Colonoscopy not done as she was recovering from SBO with wound vac. -last colonoscopy was 2015 - tortuous colon, sigmoid diverticulosis. No polyps. Repeat colon in 2016 prior to colon resection (? For diverticular dz).  -we are planning on repeat colonoscopy. Patient developed respiratory distress over weekend, required Bipap. Now back on nasal 02. No SOB -Yesterday am she has six runs of Vtach (asymptomatic). K+ was 3.3, repleted.  -clears today, if stable throughout the day will plan for colonoscopy tomorrow  Normocytic anemia. Transfused in Sept. She presented with hgb of 10.9 this admit. With bleeding it has declined slowly to 9.3  OBJECTIVE:     Vital signs in last 24 hours: Temp:  [97.6 F (36.4 C)-98.8 F (37.1 C)] 97.6 F (36.4 C) (10/21 0756) Pulse Rate:  [69-75] 75 (10/21 0756) Resp:  [17-24] 17 (10/21 0756) BP: (107-144)/(54-74) 144/72 (10/21 0756) SpO2:  [93 %-99 %] 96 % (10/21 0756) Weight:  [97.9 kg] 97.9 kg (10/21 0500) Last BM Date: 01/14/18 General:   Alert female working on laptop in NAD EENT:  Normal hearing, non icteric sclera, conjunctive pink.  Heart:  Regular rate and rhythm; no lower extremity edema Pulm: nasal 02. Normal respiratory effort, lungs CTA bilaterally without wheezes or crackles. Abdomen:  Soft, nondistended, nontender.  Normal bowel sounds, no masses felt.     Neurologic:  Alert and  oriented x4;  grossly normal neurologically. Psych:  Pleasant, cooperative.  Normal mood and affect.   Intake/Output from previous day: 10/20 0701 - 10/21 0700 In: 840 [P.O.:840] Out: 2575 [Urine:2575] Intake/Output this shift: No  intake/output data recorded.  Lab Results: Recent Labs    01/13/18 1626 01/14/18 0220 01/15/18 0253  WBC 11.3* 10.8* 11.4*  HGB 9.5* 9.6* 9.3*  HCT 32.0* 31.8* 31.6*  PLT 265 253 244   BMET Recent Labs    01/13/18 0303 01/14/18 0220 01/15/18 0253  NA 140 142 141  K 3.9 3.3* 3.5  CL 98 97* 98  CO2 37* 37* 37*  GLUCOSE 102* 75 162*  BUN 24* 23 24*  CREATININE 1.13* 1.09* 1.12*  CALCIUM 9.2 9.1 9.0     Principal Problem:   Acute on chronic respiratory failure with hypoxia and hypercapnia (HCC) Active Problems:   Adrenal insufficiency (HCC)   Type 2 diabetes mellitus without complication, with long-term current use of insulin (HCC)   Acute on chronic combined systolic and diastolic heart failure (HCC)   Iron deficiency     LOS: 11 days   Tye Savoy ,NP 01/15/2018, 11:12 AM

## 2018-01-16 ENCOUNTER — Inpatient Hospital Stay (HOSPITAL_COMMUNITY): Payer: Federal, State, Local not specified - PPO | Admitting: Certified Registered Nurse Anesthetist

## 2018-01-16 ENCOUNTER — Encounter (HOSPITAL_COMMUNITY): Payer: Self-pay | Admitting: Certified Registered Nurse Anesthetist

## 2018-01-16 ENCOUNTER — Encounter (HOSPITAL_COMMUNITY)
Admission: EM | Disposition: A | Discharge status: Home Health | Payer: Self-pay | Source: Home / Self Care | Attending: Internal Medicine

## 2018-01-16 DIAGNOSIS — K644 Residual hemorrhoidal skin tags: Secondary | ICD-10-CM

## 2018-01-16 DIAGNOSIS — K642 Third degree hemorrhoids: Secondary | ICD-10-CM

## 2018-01-16 DIAGNOSIS — K625 Hemorrhage of anus and rectum: Secondary | ICD-10-CM | POA: Diagnosis not present

## 2018-01-16 DIAGNOSIS — D128 Benign neoplasm of rectum: Secondary | ICD-10-CM | POA: Diagnosis not present

## 2018-01-16 DIAGNOSIS — K635 Polyp of colon: Secondary | ICD-10-CM

## 2018-01-16 DIAGNOSIS — K621 Rectal polyp: Secondary | ICD-10-CM

## 2018-01-16 DIAGNOSIS — D123 Benign neoplasm of transverse colon: Secondary | ICD-10-CM

## 2018-01-16 DIAGNOSIS — J9621 Acute and chronic respiratory failure with hypoxia: Secondary | ICD-10-CM | POA: Diagnosis not present

## 2018-01-16 DIAGNOSIS — K648 Other hemorrhoids: Secondary | ICD-10-CM

## 2018-01-16 DIAGNOSIS — Z980 Intestinal bypass and anastomosis status: Secondary | ICD-10-CM | POA: Diagnosis not present

## 2018-01-16 DIAGNOSIS — E2749 Other adrenocortical insufficiency: Secondary | ICD-10-CM

## 2018-01-16 DIAGNOSIS — D120 Benign neoplasm of cecum: Secondary | ICD-10-CM | POA: Diagnosis not present

## 2018-01-16 DIAGNOSIS — K5730 Diverticulosis of large intestine without perforation or abscess without bleeding: Secondary | ICD-10-CM

## 2018-01-16 DIAGNOSIS — J9622 Acute and chronic respiratory failure with hypercapnia: Secondary | ICD-10-CM | POA: Diagnosis not present

## 2018-01-16 HISTORY — PX: COLONOSCOPY WITH PROPOFOL: SHX5780

## 2018-01-16 HISTORY — PX: POLYPECTOMY: SHX5525

## 2018-01-16 LAB — GLUCOSE, CAPILLARY
GLUCOSE-CAPILLARY: 149 mg/dL — AB (ref 70–99)
GLUCOSE-CAPILLARY: 140 mg/dL — AB (ref 70–99)
Glucose-Capillary: 127 mg/dL — ABNORMAL HIGH (ref 70–99)
Glucose-Capillary: 195 mg/dL — ABNORMAL HIGH (ref 70–99)
Glucose-Capillary: 151 mg/dL — ABNORMAL HIGH (ref 70–99)

## 2018-01-16 LAB — CBC
HCT: 39 % (ref 36.0–46.0)
Hemoglobin: 11.3 g/dL — ABNORMAL LOW (ref 12.0–15.0)
MCH: 26.2 pg (ref 26.0–34.0)
MCHC: 29 g/dL — ABNORMAL LOW (ref 30.0–36.0)
MCV: 90.3 fL (ref 80.0–100.0)
NRBC: 0 % (ref 0.0–0.2)
Platelets: 275 10*3/uL (ref 150–400)
RBC: 4.32 MIL/uL (ref 3.87–5.11)
RDW: 15.7 % — ABNORMAL HIGH (ref 11.5–15.5)
WBC: 13 10*3/uL — AB (ref 4.0–10.5)

## 2018-01-16 LAB — MAGNESIUM: MAGNESIUM: 1.6 mg/dL — AB (ref 1.7–2.4)

## 2018-01-16 LAB — BASIC METABOLIC PANEL
Anion gap: 7 (ref 5–15)
BUN: 18 mg/dL (ref 8–23)
CALCIUM: 9.5 mg/dL (ref 8.9–10.3)
CHLORIDE: 97 mmol/L — AB (ref 98–111)
CO2: 36 mmol/L — ABNORMAL HIGH (ref 22–32)
CREATININE: 1.11 mg/dL — AB (ref 0.44–1.00)
GFR calc non Af Amer: 52 mL/min — ABNORMAL LOW (ref >60)
Glucose, Bld: 156 mg/dL — ABNORMAL HIGH (ref 70–99)
Potassium: 3.3 mmol/L — ABNORMAL LOW (ref 3.5–5.1)
SODIUM: 140 mmol/L (ref 135–145)

## 2018-01-16 SURGERY — COLONOSCOPY WITH PROPOFOL
Anesthesia: Monitor Anesthesia Care

## 2018-01-16 MED ORDER — PROPOFOL 10 MG/ML IV BOLUS
INTRAVENOUS | Status: DC | PRN
  Administered 2018-01-16 (×5): 20 mg via INTRAVENOUS

## 2018-01-16 MED ORDER — MAGNESIUM SULFATE 2 GM/50ML IV SOLN
2.0000 g | Freq: Once | INTRAVENOUS | Status: AC
  Administered 2018-01-16: 2 g via INTRAVENOUS
  Filled 2018-01-16: qty 50

## 2018-01-16 MED ORDER — TORSEMIDE 20 MG PO TABS
20.0000 mg | ORAL_TABLET | Freq: Every day | ORAL | Status: DC
  Administered 2018-01-17: 20 mg via ORAL
  Filled 2018-01-16: qty 1

## 2018-01-16 MED ORDER — LACTATED RINGERS IV SOLN
INTRAVENOUS | Status: DC
  Administered 2018-01-16: 07:00:00 via INTRAVENOUS

## 2018-01-16 MED ORDER — PROPOFOL 500 MG/50ML IV EMUL
INTRAVENOUS | Status: DC | PRN
  Administered 2018-01-16: 125 ug/kg/min via INTRAVENOUS

## 2018-01-16 SURGICAL SUPPLY — 22 items

## 2018-01-16 NOTE — Progress Notes (Signed)
Pt. Refused bipap.

## 2018-01-16 NOTE — Transfer of Care (Signed)
Immediate Anesthesia Transfer of Care Note  Patient: Crystal Yates  Procedure(s) Performed: COLONOSCOPY WITH PROPOFOL (N/A ) SCLEROTHERAPY POLYPECTOMY HEMOSTASIS CONTROL  Patient Location: Endoscopy Unit  Anesthesia Type:MAC  Level of Consciousness: awake and alert   Airway & Oxygen Therapy: Patient Spontanous Breathing and Patient connected to face mask oxygen  Post-op Assessment: Report given to RN and Post -op Vital signs reviewed and stable  Post vital signs: Reviewed and stable  Last Vitals:  Vitals Value Taken Time  BP    Temp    Pulse    Resp    SpO2      Last Pain:  Vitals:   01/16/18 0705  TempSrc: Oral  PainSc: 0-No pain      Patients Stated Pain Goal: 0 (09/38/18 2993)  Complications: No apparent anesthesia complications

## 2018-01-16 NOTE — Progress Notes (Signed)
OT Cancellation Note  Patient Details Name: Crystal Yates MRN: [id number] DOB: 14-Nov-1955   Cancelled Treatment:    Reason Eval/Treat Not Completed: Patient at procedure or test/ unavailable; will follow up for OT eval as schedule permits.  Lou Cal, OT Supplemental Rehabilitation Services Pager [phone] Office [phone]   Raymondo Band 01/16/2018, 7:47 AM

## 2018-01-16 NOTE — Discharge Summary (Addendum)
Name: Crystal Yates MRN: [id number] DOB: [birth year] 62 y.o. PCP: Dortha Kern, PA  Date of Admission: 01/04/2018 12:48 AM Date of Discharge: 01/17/2018 Attending Physician: No att. providers found  Discharge Diagnosis: Principal Problem:   Acute on chronic respiratory failure with hypoxia and hypercapnia ([place]) Active Problems:   Secondary adrenal insufficiency (HCC)   Type 2 diabetes mellitus without complication, with long-term current use of insulin (HCC)   Acute on chronic combined systolic and diastolic heart failure (HCC)   COPD without exacerbation (HCC)   CAD (coronary artery disease)   Iron deficiency   Internal and external bleeding hemorrhoids   Sessile colonic polyp   Diverticulosis of colon without hemorrhage    Discharge Medications: Allergies as of 01/17/2018      Reactions   Hydroxychloroquine Shortness Of Breath, Nausea Only, Other (See Comments)   Dizziness (also)   Donepezil Other (See Comments)   Dizziness, depression, and makes the patient feel "funny"   Prednisone Other (See Comments)   Causes depression and suicidal thoughts   Anticoagulant Cit Dext [acd Formula A] Other (See Comments)   Unknown   Bupropion Other (See Comments)   Suicidal thoughts   Metrizamide Other (See Comments)   (a non-ionic radiopaque contrast agent) "Blows the vein" and contrast gathers at the injected site's limb   Varenicline Other (See Comments)   Suicidal thoughts   Tape Rash, Other (See Comments)   Paper tape is preferred, PLEASE      Medication List    STOP taking these medications   HYDROcodone-acetaminophen 5-325 MG tablet Commonly known as:  NORCO/VICODIN     TAKE these medications   acetaminophen 325 MG tablet Commonly known as:  TYLENOL Take 2 tablets (650 mg total) by mouth every 4 (four) hours as needed for headache or mild pain.   ALPRAZolam 0.25 MG tablet Commonly known as:  XANAX Take 1 tablet (0.25 mg total) by mouth 3 (three)  times daily as needed for anxiety (Panic attack).   aspirin 81 MG chewable tablet Chew 1 tablet (81 mg total) by mouth daily.   atorvastatin 80 MG tablet Commonly known as:  LIPITOR Take 1 tablet (80 mg total) by mouth daily at 6 PM.   CALCIUM PO Take 1 tablet by mouth daily.   clopidogrel 75 MG tablet Commonly known as:  PLAVIX Take 1 tablet (75 mg total) by mouth daily.   collagenase ointment Commonly known as:  SANTYL Apply topically daily.   DULoxetine 60 MG capsule Commonly known as:  CYMBALTA Take 60 mg by mouth daily.   EMGALITY 120 MG/ML Soaj Generic drug:  Galcanezumab-gnlm Inject 120 mg into the skin every 30 (thirty) days.   FIASP FLEXTOUCH 100 UNIT/ML Sopn Generic drug:  Insulin Aspart (w/Niacinamide) Inject 2-14 Units into the skin See admin instructions. Use three times day as needed before meals per sliding scale   FUSION PLUS Caps Take 1 capsule by mouth daily.   hydrocortisone 2.5 % rectal cream Commonly known as:  ANUSOL-HC Place rectally 2 (two) times daily.   hydrocortisone 5 MG tablet Commonly known as:  CORTEF Take 5-15 mg by mouth See admin instructions. Take 15 mg by mouth in the morning and 5 mg in the afternoon   ipratropium-albuterol 0.5-2.5 (3) MG/3ML Soln Commonly known as:  DUONEB Take 3 mLs by nebulization 4 (four) times daily.   levothyroxine 150 MCG tablet Commonly known as:  SYNTHROID, LEVOTHROID Take 150 mcg by mouth daily before breakfast.   Magnesium  400 MG Tabs Take 400 mg by mouth daily.   metoprolol succinate 25 MG 24 hr tablet Commonly known as:  TOPROL-XL Take 0.5 tablets (12.5 mg total) by mouth daily.   nitroGLYCERIN 0.4 MG SL tablet Commonly known as:  NITROSTAT Place 0.4 mg under the tongue every 5 (five) minutes as needed for chest pain.   oxybutynin 15 MG 24 hr tablet Commonly known as:  DITROPAN XL Take 15 mg by mouth at bedtime.   oxyCODONE 5 MG immediate release tablet Commonly known as:  Oxy  IR/ROXICODONE Take 5 mg by mouth every 4 (four) hours as needed for pain.   polyethylene glycol packet Commonly known as:  MIRALAX / GLYCOLAX Take 17 g by mouth daily.   potassium chloride SA 20 MEQ tablet Commonly known as:  K-DUR,KLOR-CON Take 1 tablet (20 mEq total) by mouth daily. Take when you take Torsemide. What changed:  additional instructions   pramipexole 1 MG tablet Commonly known as:  MIRAPEX Take 1 mg by mouth 2 (two) times daily.   PROAIR HFA 108 (90 Base) MCG/ACT inhaler Generic drug:  albuterol Inhale 2 puffs into the lungs See admin instructions. Inhale 2 puffs into the lungs every 4-6 hours as needed for shortness of breath or wheezing   promethazine 12.5 MG tablet Commonly known as:  PHENERGAN Take 12.5 mg by mouth every 6 (six) hours as needed for nausea.   sodium chloride 0.65 % Soln nasal spray Commonly known as:  OCEAN Place 1 spray into both nostrils as needed for congestion.   torsemide 20 MG tablet Commonly known as:  DEMADEX Take 1 tablet (20 mg total) by mouth daily as needed. Dry weight 216 lb. Can take extra dose if you gain 3 lb in a day or 5 lb in 1 week What changed:    how much to take  when to take this  reasons to take this  additional instructions   TRELEGY ELLIPTA 100-62.5-25 MCG/INH Aepb Generic drug:  Fluticasone-Umeclidin-Vilant Inhale 1 puff into the lungs daily.   TRESIBA FLEXTOUCH 100 UNIT/ML Sopn FlexTouch Pen Generic drug:  insulin degludec Inject 40 Units into the skin at bedtime.   TRULICITY 1.5 EX/5.1ZG Sopn Generic drug:  Dulaglutide Inject 1.5 mg into the skin every Sunday.   Vitamin D3 2000 units capsule Take 2,000 Units by mouth daily.       Disposition and follow-up:    [name] was discharged from [facility] in Stable condition.  At the hospital follow up visit please address:  1. Acute on Chronic Heart failure: Patient had volume overload on admission, initially  given IV Furosemide with not optimal response. It then switched to Torsemide. She diuresed well and with Torsemide. Euvolemic on discharge with dry weight of 216 lb. Please evaluate for weight changes and volume status at follow up visit.  2 Secondary adrenal insufficieny: Patient was on Hydrocortisone 15 mg at AM and 5 m in the evening and Florinef 0.1 mg BID. Having secondary adrenal insufficiency, and per last treatment by her endocrinologist, she was supposed not to be on Florinef. (High dose of Florinef would also be a reason for ongoing hypokalemia and resistant volume overload) we tapered and DC'd Florinef at discharge.  3. Lower GI bleeding: Patient had few episodes of bright red blood per rectom. With past medical history of Hemorrhoid, and diverticulitis s/p bowel resection (2016)). She underwent colonoscopy on this admission and the  Rectal bleeding found to be due to hemorrhoid  at colonoscopy.  Bleeding stopped and stabled at discharge. Recommend out patient management.  4. Sessile polyps found and removed on colonoscopy. Repeat colonoscopy in 3 years  5. Anxiety/ panic attack: Seems to play a role in episodic shortness of breath for her.  We recommend outpatient therapy.  6. COPD, on nasal O2 at home during activity. At discharge, she saturated well at 94-96% on 2 Li nasal O2. Also will take benefit of home health pulmonary rehab and PT-OT. Please make sure patient use incentive spirometer.  7. Patient with history of CAD status post stent. Her ASA and Plavix held due to GI bleeding. She can resume ASA, Plavix at 01/20/2018.  However, please evaluate if she can continue only with aspirin, as her last stent was at January 2018  8. Asymptomatic UTI: Patient had urinary cath, had gross hematuria. U/A was positive for nitrate and leucocyte. Hematuria resolved after removing cath. She remained asymptomatic. Please f/u with repeating U/A and evaluation for symptoms.   9. CAD s/p stents:   Asymptomatic and stable during admission. Will hold ASA and Plavix for 2 more days after discharge due to GI bleeding. (Per cardiologist, Angioplasty procedure note on 10/13/2017: Dual antiplatelet therapy recommended indefinitely). Please reevaluate if further bleeding.  *  Labs / imaging needed at time of follow-up: BMP, U/A, BMP, CBC  *.  Pending labs/ test needing follow-up: None  Follow-up Appointments: [facility] Follow up.   Specialty:  Home Health Services Why:  HHRN, HHAIDE, HHPT Contact information: [address] [place] [address] [phone]        Dortha Kern, PA Follow up.   Specialty:  Physician Assistant Contact information: [address]  [address] [place] [address] [phone]        Park Liter, MD .   Specialty:  Cardiology Contact information: [place] [address] [facility] Course by problem list: 1. Respiratory failure & 2. Acute on chronic CHF Ms. Gruel is a 62 y/o female with past medical Hx. Of COPD (On O2 at home) , HFpEF, CKD III, DM, Secondary adrenal Insufficiency, who presented to ED due to difficulty breathing.   Patient presented with shortness of breath, was obtunded on arrival. Admitted for acute on chronic hypercarbic hypoxic respiratory failure. She subsequently admitted in ICU and intubated. She then improved, extubated and transferred to our service. She was volume overload on arrival. Her shortness of breath seemed to be in setting of COPD, acute on chronic CHF and with anxiety component. Her volume status improved with diuresis (She initially showed resistance to Lasix and then showed significant improvement in volume overlad after switching to Torsemide.She got euvolemic at discharge with dry weight of 216 lb.  3.COPD:  On 2 lit nasal O2 at home during activity.  She has been on pulmicort and duoneb that continued on this  admission. Her O2 requirement transiently increased during admission when she developed panic attack. She was on BiPAP for few hours and then came back to her baseline.  2. Anxiety panic attack: Look to play a role in episodic shortness of breath for her.  Her last desaturation and shortness of breath during admission that ended up BiPAP, responded well to Ativan she came back to herbaseline of saturation after Ativan.  3. Hypokalemia: Ms. Savary had ongoing hypokalemia during this admission despite daily potassium supplement and repletion. 2/2 aggressive diuresis as  well as initial high dose of Florinef. Improved with titrating down torsemide and tapering and discontinuing of Florinef.  4. lower GI bleeding: Patient hospitalization complicated with lower GI bleeding, drop in hemoglobin.  History of diverticula, hemorrhoid. Suspected bleeding cause was her hemorrhoids per colonoscopy. Her hemoglobin remaind stable around 10 discharge.      Discharge Vitals:   BP 133/72   Pulse 68   Temp 98.1 F (36.7 C) (Oral)   Resp 18   Ht 5\' 2"  (1.575 m)   Wt 98.3 kg   SpO2 96%   BMI 39.64 kg/m   Dry weight at discharge: 216 lb  Pertinent Labs, Studies, and Procedures:   Colonoscopy 01/16/2018  Non-thrombosed external hemorrhoids and non-thrombosed internal hemorrhoids  *One 9 mm polyp in the cecum, removed with mucosal resection. Resected and retrieved. *Four 3 to 5 mm polyps in the rectum, in the transverse colon and in the cecum, removed *Patent end-to-end colo-colonic anastomosis, characterized by healthy appearing mucosa. *Two diverticulae noted in the recto-sigmoid colon. *Non-bleeding non-thrombosed external and internal hemorrhoids  Discharge Instructions:  Thank you for allowing [place] taking care of you at [facility].  We are glad that you are doing better.  Please make sure to follow-up with your primary care next week as a scheduled for you to do the blood test and  follow-up with your medication.  As we talked before, please hold your aspirin and Plavix for next 2 days and then resume and follow-up with primary care or cardiologist decide about continuing Plavix. Please weight yourself daily at the same time, with same close and same scale.  If you gain more than 3 pounds in a day or 5 pounds a week, take your torsemide as well as potassium and talk to your doctor. He can call us back at [phone] if you have any questions or concern. As always, if develop any serious symptoms, please seek medical attention at the emergency room.  Thanks Dr. Myrtie Hawk Discharge Instructions    Face-to-face encounter (required for Medicare/Medicaid patients)   Complete by:  As directed    I [place] certify that this patient is under my care and that I, or a nurse practitioner or physician's assistant working with me, had a face-to-face encounter that meets the physician face-to-face encounter requirements with this patient on 01/17/2018. The encounter with the patient was in whole, or in part for the following medical condition(s) which is the primary reason for home health care (List medical condition): COPD, CHF, surgical wound   The encounter with the patient was in whole, or in part, for the following medical condition, which is the primary reason for home health care:  Heart failure, COPD, Surgical incision wound care, GI bleeding   I certify that, based on my findings, the following services are medically necessary home health services:   Physical therapy Nursing     Reason for Medically Necessary Home Health Services:   Skilled Nursing- Assessment and Observation of Wound Status Therapy- Gait Training, Transfer Training and Stair Training     My clinical findings support the need for the above services:  Shortness of breath with activity   Further, I certify that my clinical findings support that this patient is homebound due to:  Unable to leave home  safely without assistance   Home Health   Complete by:  As directed    To provide the following care/treatments:   PT OT        Signed: [place],  Ilee Randleman, MD 01/22/2018, 9:32 AM   Pager: [phone]

## 2018-01-16 NOTE — Progress Notes (Signed)
Subjective: Patient was seen and evaluated after colonoscopy. Is doing well. No abdominal pain, nausea or vomiting. No shortness of breath of nasal O2. We discussed her colonoscopy result and plan to monitor her for another day. She is agreeable.  Objective:  Vital signs in last 24 hours: Vitals:   01/16/18 0821 01/16/18 0830 01/16/18 0840 01/16/18 0858  BP: 133/60 (!) 130/47 109/67 110/67  Pulse: 83 65 61 64  Resp: (!) 23 18 18    Temp: 97.7 F (36.5 C)   (!) 97.5 F (36.4 C)  TempSrc: Oral   Oral  SpO2: 100% 100% 100% 98%  Weight:      Height:       Vital signs reviewd General: Obese lady, in no acute distress. CV: RRR, no murmur Lungs: Decreased breath sound, no rale, no wheeze Abdomen: Abdominal surgery site has dressing and is intact. Abdomen is soft and non tender Extremities: 1+ bilateral LEE, pulses are palpable bilaterally Neurology exam: Alert and oriented x 3 Psychology: Normal mood and affect  CBC Latest Ref Rng & Units 01/16/2018 01/15/2018 01/14/2018  WBC 4.0 - 10.5 K/uL 13.0(H) 11.4(H) 10.8(H)  Hemoglobin 12.0 - 15.0 g/dL 11.3(L) 9.3(L) 9.6(L)  Hematocrit 36.0 - 46.0 % 39.0 31.6(L) 31.8(L)  Platelets 150 - 400 K/uL 275 244 253   BMP Latest Ref Rng & Units 01/16/2018 01/15/2018 01/14/2018  Glucose 70 - 99 mg/dL 156(H) 162(H) 75  BUN 8 - 23 mg/dL 18 24(H) 23  Creatinine 0.44 - 1.00 mg/dL 1.11(H) 1.12(H) 1.09(H)  BUN/Creat Ratio 12 - 28 - - -  Sodium 135 - 145 mmol/L 140 141 142  Potassium 3.5 - 5.1 mmol/L 3.3(L) 3.5 3.3(L)  Chloride 98 - 111 mmol/L 97(L) 98 97(L)  CO2 22 - 32 mmol/L 36(H) 37(H) 37(H)  Calcium 8.9 - 10.3 mg/dL 9.5 9.0 9.1     Assessment/Plan:  Principal Problem:   Acute on chronic respiratory failure with hypoxia and hypercapnia (HCC) Active Problems:   Adrenal insufficiency (HCC)   Type 2 diabetes mellitus without complication, with long-term current use of insulin (HCC)   Acute on chronic combined systolic and diastolic heart  failure (HCC)   Iron deficiency  Acute on chronic respiratory failure with hypoxia and hypercapnia: Improved.   Shortness of breath: With Hx of COPD, on O2 nasal at home, recent abdominal surgery (diaphragmatic weakness?),  Acute on chronic CHF exacerbation and anxiety related.  Improvd with torsemide. In no respiratory distress on 2 lit nasal O2 now. Saturating remained at 95%  -C/w 40 mg Torsemide daily -Continue cardiac monitoring -C/w nasal O2 at 2 li, keep O2 sat>92%  Blood per rectum:No more bleeding reported. Hb has been stable. Hb stable (Today Hb:11.3) S/p colonoscopy today. That showed: s  Colonoscopy result: Non-thrombosed external hemorrhoids and non-thrombosed internal hemorrhoids  *One 9 mm polyp in the cecum, removed with mucosal resection. Resected and retrieved. *Four 3 to 5 mm polyps in the rectum, in the transverse colon and in the cecum, removed *Patent end-to-end colo-colonic anastomosis, characterized by healthy appearing mucosa. *Two diverticulae noted in the recto-sigmoid colon. *Non-bleeding non-thrombosed external and internal hemorrhoids  -Keep Holding ASA and Plavix,and Heparin for 3 more days -Monitor CBC, transfusion goal Hb>8 -Follow GI recommendation for long term follow up    Gross hematuria through Foley cath yesterday:  U/A s positive for large leukocytes, large hemoglobin, bacteria.  Cath removed. Asymptomatic.  -follow up Urine cultureresult  Acute on Chronic combined systolic and diastolic heart failure:  No chest  pain or SOB on nasal O2. Significant improvement with Torsemide.  Weight 216 lb today, was 215 yesterday  (last dry weight 225) Almost euvolemic on exam Yesterday net -8  -C/w Torsemide40 mg QD -Monitor BMP -Strict intake/output -Daily weight(no weight in the chart in past few 2 days)  Hypokalemia:  likely 2/2 Torsemide also adrenal insufficiency treatment . K today 3.3 Mg:1.6 repleted with 2 gr IV  Mg  -Check BMPdaily -C/w30 meq K-dur -C/w homeMg Ox 400 QD  HTN:BP today:109/67 -c/w Metoprolol succinate 12.5 mg QD  Hypothyroidism:Stable -Continue synthroid 150 mcg QD  COPD:On 2 lit nasal O2 at home and now. O2 sat: 95% Stable -Continue with Duoneb nebulizer,PulmicortandIS -Outpatient pulmonary rehab is recommended -PTrecommendedhome with home healthafter DC  CADs/p stent:Asymptomatic and stable -Keep holdingASA, Plavix for 3 more days - c/wLipitor  Adrenal insufficiency:  -C/w Florinef:0.1 mg QD -C/w Hydrocortisone 15 mg in AM and 5mg  in PM -F/u with endocrinologist outpatient for medications plan (Florinef)  E0FE,OFHQRFX complication:BG today: 588 Stable with Tx. -C/wwith 5 unit of Novolog 3 times daily with meal  -C/w Moderate SSI and Lantus 20 mg QD -Hypoglycemia protocol for CBG,70  Panic attackand anxiety:Controlled. No more episode -C/w Cymbalta 60 mg QD  Iv f:none Diet: heart healthy VTE ppx:SCD Code:full Dispo: Anticipated discharge tomorrow  Dewayne Hatch, MD 01/16/2018, 2:17 PM Pager: [phone]

## 2018-01-16 NOTE — Anesthesia Preprocedure Evaluation (Addendum)
Anesthesia Evaluation  Patient identified by MRN, date of birth, ID band Patient awake    Reviewed: Allergy & Precautions, NPO status , Patient's Chart, lab work & pertinent test results  Airway Mallampati: II  TM Distance: >3 FB Neck ROM: Full    Dental  (+) Edentulous Upper, Edentulous Lower   Pulmonary sleep apnea , COPD,  oxygen dependent, Current Smoker,    Pulmonary exam normal breath sounds clear to auscultation       Cardiovascular hypertension, Pt. on home beta blockers + angina + CAD, + Past MI, + Cardiac Stents and +CHF  Normal cardiovascular exam Rhythm:Regular Rate:Normal  ECG: ST, rate 123  ECHO: LV EF: 60% -   65%  CATH: Normal left main. Proximal to mid LAD stent with diffuse 30% restenosis. Proximal circumflex with an eccentric 50 to 65% stenosis depending upon review.  Progression at this site is noted when compared to one year ago when this region contained 30% narrowing.  FFR was not possible due to COPD and intolerance of adenosine. Dominant right coronary with proximal to distal stenting and overlap fashion, possibly with some region of stent within stent.  Distally there is an eccentric 70 to 75% stenosis that is clearly progressed when compared to one year ago when there was less than 50% narrowing. Mild pulmonary hypertension with mean PA pressure of 30 mmHg. Reduced LV systolic function with regional wall motion abnormality including severe hypokinesis of the inferobasal wall.  Estimated EF 40 to 45%.  LVEDP is 20 mmHg.  Mean pulmonary capillary wedge pressure is 20 mmHg. Successful balloon angioplasty of distal right coronary ISR reducing from 75% to 0% with TIMI grade III flow using a 3.5 mm [place] balloon x2 inflations.  Sees cardiologist   Neuro/Psych  Headaches, PSYCHIATRIC DISORDERS Depression    GI/Hepatic Neg liver ROS,   Endo/Other  diabetes, Insulin DependentHypothyroidism Morbid obesity   Renal/GU Renal disease     Musculoskeletal   Abdominal (+) + obese,   Peds  Hematology HLD   Anesthesia Other Findings GI bleed  Reproductive/Obstetrics                            Anesthesia Physical Anesthesia Plan  ASA: IV  Anesthesia Plan: MAC   Post-op Pain Management:    Induction: Intravenous  PONV Risk Score and Plan: 1 and Propofol infusion and Treatment may vary due to age or medical condition  Airway Management Planned: Natural Airway  Additional Equipment:   Intra-op Plan:   Post-operative Plan:   Informed Consent: I have reviewed the patients History and Physical, chart, labs and discussed the procedure including the risks, benefits and alternatives for the proposed anesthesia with the patient or authorized representative who has indicated his/her understanding and acceptance.   Dental advisory given  Plan Discussed with: CRNA  Anesthesia Plan Comments:        Anesthesia Quick Evaluation

## 2018-01-16 NOTE — Evaluation (Signed)
Occupational Therapy Evaluation Patient Details Name: Crystal Yates MRN: [id number] DOB: [birth year] Today's Date: 01/16/2018    History of Present Illness Pt adm with recurrent acute respiratory failure and acute on chronic heart failure. Intubated 10/10-10/11. PMH - chf, cad, dm, htn, obesity, copd, ckd, laparotomy for SBO   Clinical Impression   This 62 y/o female presents with the above. At baseline pt reports mod independence with ADLs and functional mobility using rollator. Pt currently requires setupA for UB ADL, minguard-minA for LB ADLs. Pt on supplemental O2 during session with VSS. Initiated education regarding energy conservation strategies during ADL and mobility tasks after return home. Pt will benefit from continued acute OT services and recommend follow up [facility] services after discharge to maximize her overall safety and independence with ADLs and mobility. Will follow.     Follow Up Recommendations  Home health OT;Supervision/Assistance - 24 hour(24hr initially)    Equipment Recommendations  None recommended by OT           Precautions / Restrictions Precautions Precautions: Fall Restrictions Weight Bearing Restrictions: No      Mobility Bed Mobility Overal bed mobility: Needs Assistance Bed Mobility: Supine to Sit;Sit to Supine     Supine to sit: Supervision;HOB elevated Sit to supine: Min guard;HOB elevated   General bed mobility comments: supervision-minguard for safety, increased effort when returning to supine, no physical assist required  Transfers Overall transfer level: Needs assistance Equipment used: None Transfers: Sit to/from Stand Sit to Stand: Supervision         General transfer comment: S for safety, no physical assist given     Balance Overall balance assessment: Mild deficits observed, not formally tested                                         ADL either performed or assessed with clinical judgement    ADL Overall ADL's : Needs assistance/impaired Eating/Feeding: Modified independent;Sitting   Grooming: Modified independent;Supervision/safety;Sitting   Upper Body Bathing: Supervision/ safety;Sitting   Lower Body Bathing: Minimal assistance;Sit to/from stand   Upper Body Dressing : Min guard;Sitting   Lower Body Dressing: Minimal assistance;Sit to/from stand Lower Body Dressing Details (indicate cue type and reason): pt reports she typically uses sock aide for LB dressing; reports she has reacher but doesn't always use for LB dressing task, though reports increased effort without use Toilet Transfer: Min Statistician Details (indicate cue type and reason): simulated in transfer to/from EOB - limited distance due to pt not currently on portable telemetry Toileting- Clothing Manipulation and Hygiene: Min guard;Sit to/from stand       Functional mobility during ADLs: Min guard General ADL Comments: initiated discussion regarding energy conservation after return home during ADL and functional mobility; pt verbalizing understanding, will benefit from continued review                         Pertinent Vitals/Pain Pain Assessment: No/denies pain     Hand Dominance Left   Extremity/Trunk Assessment Upper Extremity Assessment Upper Extremity Assessment: RUE deficits/detail RUE Deficits / Details: limited shoulder ROM at baseline due to previous injury (grossly 0-100*)   Lower Extremity Assessment Lower Extremity Assessment: Defer to PT evaluation       Communication Communication Communication: No difficulties   Cognition Arousal/Alertness: Awake/alert Behavior During Therapy: WFL for tasks assessed/performed Overall Cognitive Status: Within  Functional Limits for tasks assessed                                                Home Living Family/patient expects to be discharged to:: Private residence Living Arrangements:  Spouse/significant other Available Help at Discharge: Family;Available PRN/intermittently Type of Home: House Home Access: Stairs to enter [employer] of Steps: 4 Entrance Stairs-Rails: Left Home Layout: One level     Bathroom Shower/Tub: Walk-in shower;Tub/shower unit("walk-in tub")   Bathroom Toilet: Standard Bathroom Accessibility: Yes   Home Equipment: Clinical cytogeneticist - 4 wheels;Grab bars - tub/shower;Grab bars - toilet;Cane - single point;Adaptive equipment Adaptive Equipment: Sock aid;Reacher        Prior Functioning/Environment Level of Independence: Needs assistance  Gait / Transfers Assistance Needed: modified independent with rollator and O2 ADL's / Homemaking Assistance Needed: mod independent with ADLs using AE (sock aide) and DME            OT Problem List: Decreased activity tolerance;Decreased strength;Cardiopulmonary status limiting activity      OT Treatment/Interventions: Self-care/ADL training;Therapeutic exercise;DME and/or AE instruction;Therapeutic activities;Patient/family education    OT Goals(Current goals can be found in the care plan section) Acute Rehab OT Goals Patient Stated Goal: return home OT Goal Formulation: With patient Time For Goal Achievement: 01/30/18 Potential to Achieve Goals: Good  OT Frequency: Min 2X/week   Barriers to D/C:            Co-evaluation              AM-PAC PT "6 Clicks" Daily Activity     Outcome Measure Help from another person eating meals?: None Help from another person taking care of personal grooming?: None Help from another person toileting, which includes using toliet, bedpan, or urinal?: A Little Help from another person bathing (including washing, rinsing, drying)?: A Little Help from another person to put on and taking off regular upper body clothing?: None Help from another person to put on and taking off regular lower body clothing?: A Little 6 Click Score: 21   End of  Session Equipment Utilized During Treatment: Oxygen Nurse Communication: Mobility status  Activity Tolerance: Patient tolerated treatment well Patient left: in bed;with call bell/phone within reach  OT Visit Diagnosis: Muscle weakness (generalized) (M62.81)                Time: 2263-3354 OT Time Calculation (min): 31 min Charges:  OT General Charges $OT Visit: 1 Visit OT Evaluation $OT Eval Moderate Complexity: 1 Mod OT Treatments $Self Care/Home Management : 8-22 mins  Lou Cal, OT Supplemental Rehabilitation Services Pager [phone] Office [phone]   Raymondo Band 01/16/2018, 4:00 PM

## 2018-01-16 NOTE — Interval H&P Note (Signed)
History and Physical Interval Note:  01/16/2018 7:04 AM  Crystal Yates  has presented today for surgery, with the diagnosis of colonoscopy  The various methods of treatment have been discussed with the patient and family. After consideration of risks, benefits and other options for treatment, the patient has consented to  Procedure(s): COLONOSCOPY WITH PROPOFOL (N/A) as a surgical intervention .  The patient's history has been reviewed, patient examined, no change in status, stable for surgery.  I have reviewed the patient's chart and labs.  Questions were answered to the patient's satisfaction.    The risks and benefits of endoscopic evaluation were discussed with the patient; these include but are not limited to the risk of perforation, infection, bleeding, missed lesions, lack of diagnosis, severe illness requiring hospitalization, as well as anesthesia and sedation related illnesses.  The patient is agreeable to proceed.     [employer]

## 2018-01-16 NOTE — Op Note (Addendum)
[facility] Patient Name: Crystal Yates Procedure Date : 01/16/2018 MRN: [id number] Attending MD: Justice Britain , MD Date of Birth: 1955/09/22 CSN: [id number] Age: 62 Admit Type: Inpatient Procedure:                Colonoscopy Indications:              Hematochezia, Personal history of digestive                            disease, Follow-up endoscopy after surgery,                            Follow-up of diverticula Providers:                Justice Britain, MD, Baird Cancer, RN, Elspeth Cho Tech., Technician Referring MD:             Lajuan Lines. Pyrtle, MD, Dr. Heber Kearney Medicines:                Monitored Anesthesia Care Complications:            No immediate complications. Estimated Blood Loss:     Estimated blood loss was minimal. Procedure:                Pre-Anesthesia Assessment:                           - Prior to the procedure, a History and Physical                            was performed, and patient medications and                            allergies were reviewed. The patient's tolerance of                            previous anesthesia was also reviewed. The risks                            and benefits of the procedure and the sedation                            options and risks were discussed with the patient.                            All questions were answered, and informed consent                            was obtained. Prior Anticoagulants: The patient has                            taken Plavix (clopidogrel), last dose was 12 days  prior to procedure. ASA Grade Assessment: III - A                            patient with severe systemic disease. After                            reviewing the risks and benefits, the patient was                            deemed in satisfactory condition to undergo the                            procedure.                           After obtaining  informed consent, the colonoscope                            was passed under direct vision. Throughout the                            procedure, the patient's blood pressure, pulse, and                            oxygen saturations were monitored continuously. The                            CF-HQ190L ([id number]) Olympus adult colon was                            introduced through the anus and advanced to the 5                            cm into the ileum. The colonoscopy was performed                            without difficulty. The patient tolerated the                            procedure. The quality of the bowel preparation was                            good. Scope In: 4:62:86 AM Scope Out: 8:12:04 AM Scope Withdrawal Time: 0 hours 19 minutes 29 seconds  Total Procedure Duration: 0 hours 25 minutes 23 seconds  Findings:      The digital rectal exam findings include non-thrombosed external       hemorrhoids and non-thrombosed internal hemorrhoids. Pertinent negatives       include no palpable rectal lesions.      The terminal ileum and ileocecal valve appeared normal.      A 9 mm polyp was found in the cecum. The polyp was sessile. Preparations       were made for mucosal resection. Saline was injected to raise the       lesion. Cold snare mucosal resection was  performed. Resection and       retrieval were complete.      Four sessile polyps were found in the rectum, transverse colon and       cecum. The polyps were 3 to 5 mm in size. These polyps were removed with       a cold snare. Resection and retrieval were complete.      A non-bleeding superficial mucosal tear of uncertain etiology was found       in the ascending colon. This was small in size. Query a result of       barotrauma or from ascending colon retroflexion. Even thought the defect       was small and superficial, decision was made to close defect, one       hemostatic clip was successfully placed (MR conditional).  There was no       bleeding during, or at the end, of the procedure.      There was evidence of a prior end-to-end colo-colonic anastomosis in the       recto-sigmoid colon. This was patent and was characterized by healthy       appearing mucosa. The anastomosis was traversed. This was found at       approximately 20 cm.      Two small-mouthed diverticula were found in the recto-sigmoid colon near       region of anastomosis.      Normal mucosa was found in the entire colon otherwise.      Non-bleeding non-thrombosed external and internal hemorrhoids were found       during retroflexion, during perianal exam and during digital exam. The       hemorrhoids were Grade III (internal hemorrhoids that prolapse but       require manual reduction). Impression:               - Non-thrombosed external hemorrhoids and                            non-thrombosed internal hemorrhoids found on                            digital rectal exam.                           - The examined portion of the ileum was normal.                           - One 9 mm polyp in the cecum, removed with mucosal                            resection. Resected and retrieved.                           - Four 3 to 5 mm polyps in the rectum, in the                            transverse colon and in the cecum, removed with a                            cold snare. Resected and  retrieved.                           - Superficial mucosal rent noted in the ascending                            colon. Clip (MR conditional) was placed.                           - Patent end-to-end colo-colonic anastomosis,                            characterized by healthy appearing mucosa.                           - Two diverticulae noted in the recto-sigmoid colon.                           - Normal mucosa in the entire examined colon                            otherwise.                           - Non-bleeding non-thrombosed external and internal                             hemorrhoids. Recommendation:           - The patient will be observed post-procedure,                            until all discharge criteria are met.                           - Return patient to hospital ward for ongoing care.                           - Patient has a contact number available for                            emergencies. The signs and symptoms of potential                            delayed complications were discussed with the                            patient. Return to normal activities tomorrow.                            Written discharge instructions were provided to the                            patient.                           - Resume previous diet.                           -  Continue present medications.                           - Await pathology results.                           - Should be on Colace 1-2 times daily, Fiber                            supplementation 1-2 times daily, Miralax every                            other day or every day to maintain soft bowel                            movements.                           - Recent bleeding likely a result of                            internal/external hemorrhoids. If aggressive bowel                            regimen were to be in place and still having                            episodes of hematochezia, would consider the role                            of outpatient hemorrhoidal banding.                           - Repeat colonoscopy in 3 years for surveillance                            based on pathology results based on patient's                            medical comorbidities at the time.                           - If patient is to go back on anti-PLT therapy then                            would try and hold for at least 48-72 hours to                            decrease risk of post-polypectomy bleeding.                           - The findings and  recommendations were discussed                            with the  patient.                           - The findings and recommendations were discussed                            with the patient's family.                           - The findings and recommendations were discussed                            with the referring physician. Procedure Code(s):        --- Professional ---                           [phone], 59, Colonoscopy, flexible; with endoscopic                            mucosal resection                           [phone], Colonoscopy, flexible; with removal of                            tumor(s), polyp(s), or other lesion(s) by snare                            technique Diagnosis Code(s):        --- Professional ---                           K64.2, Third degree hemorrhoids                           K64.4, Residual hemorrhoidal skin tags                           K57.30, Diverticulosis of large intestine without                            perforation or abscess without bleeding                           D12.0, Benign neoplasm of cecum                           K62.1, Rectal polyp                           D12.3, Benign neoplasm of transverse colon (hepatic                            flexure or splenic flexure)                           S36.590A, Other injury of ascending [right] colon,  initial encounter                           Z98.0, Intestinal bypass and anastomosis status                           K92.1, Melena (includes Hematochezia)                           Z87.19, Personal history of other diseases of the                            digestive system                           Z09, Encounter for follow-up examination after                            completed treatment for conditions other than                            malignant neoplasm CPT copyright 2018 [employer]. All rights reserved. The codes documented in this report  are preliminary and upon coder review may  be revised to meet current compliance requirements. Justice Britain, MD 01/16/2018 8:38:37 AM Number of Addenda: 0

## 2018-01-17 DIAGNOSIS — J449 Chronic obstructive pulmonary disease, unspecified: Secondary | ICD-10-CM | POA: Diagnosis not present

## 2018-01-17 DIAGNOSIS — J9622 Acute and chronic respiratory failure with hypercapnia: Secondary | ICD-10-CM | POA: Diagnosis not present

## 2018-01-17 DIAGNOSIS — K649 Unspecified hemorrhoids: Secondary | ICD-10-CM

## 2018-01-17 DIAGNOSIS — F410 Panic disorder [episodic paroxysmal anxiety] without agoraphobia: Secondary | ICD-10-CM | POA: Diagnosis not present

## 2018-01-17 DIAGNOSIS — N390 Urinary tract infection, site not specified: Secondary | ICD-10-CM

## 2018-01-17 DIAGNOSIS — J9621 Acute and chronic respiratory failure with hypoxia: Secondary | ICD-10-CM | POA: Diagnosis not present

## 2018-01-17 LAB — BASIC METABOLIC PANEL
Anion gap: 8 (ref 5–15)
BUN: 19 mg/dL (ref 8–23)
CALCIUM: 9.6 mg/dL (ref 8.9–10.3)
CO2: 33 mmol/L — ABNORMAL HIGH (ref 22–32)
CREATININE: 1.21 mg/dL — AB (ref 0.44–1.00)
Chloride: 99 mmol/L (ref 98–111)
GFR calc Af Amer: 54 mL/min — ABNORMAL LOW (ref >60)
GFR, EST NON AFRICAN AMERICAN: 47 mL/min — AB (ref >60)
GLUCOSE: 223 mg/dL — AB (ref 70–99)
Potassium: 4.2 mmol/L (ref 3.5–5.1)
Sodium: 140 mmol/L (ref 135–145)

## 2018-01-17 LAB — CBC
HCT: 35.6 % — ABNORMAL LOW (ref 36.0–46.0)
Hemoglobin: 10.4 g/dL — ABNORMAL LOW (ref 12.0–15.0)
MCH: 26.5 pg (ref 26.0–34.0)
MCHC: 29.2 g/dL — AB (ref 30.0–36.0)
MCV: 90.6 fL (ref 80.0–100.0)
PLATELETS: 247 10*3/uL (ref 150–400)
RBC: 3.93 MIL/uL (ref 3.87–5.11)
RDW: 15.7 % — AB (ref 11.5–15.5)
WBC: 13.3 10*3/uL — ABNORMAL HIGH (ref 4.0–10.5)
nRBC: 0 % (ref 0.0–0.2)

## 2018-01-17 LAB — POTASSIUM: Potassium: 4.2 mmol/L (ref 3.5–5.1)

## 2018-01-17 LAB — GLUCOSE, CAPILLARY
Glucose-Capillary: 148 mg/dL — ABNORMAL HIGH (ref 70–99)
Glucose-Capillary: 214 mg/dL — ABNORMAL HIGH (ref 70–99)

## 2018-01-17 MED ORDER — SALINE SPRAY 0.65 % NA SOLN: 1.0000 | NASAL | 0 refills | Status: DC | PRN

## 2018-01-17 MED ORDER — CLOPIDOGREL BISULFATE 75 MG PO TABS: 75.0000 mg | ORAL_TABLET | Freq: Every day | ORAL | 0 refills | Status: AC

## 2018-01-17 MED ORDER — POTASSIUM CHLORIDE CRYS ER 20 MEQ PO TBCR: 20.0000 meq | EXTENDED_RELEASE_TABLET | Freq: Every day | ORAL | 3 refills | Status: DC

## 2018-01-17 MED ORDER — HYDROCORTISONE 2.5 % RE CREA: TOPICAL_CREAM | Freq: Two times a day (BID) | RECTAL | 0 refills | Status: DC

## 2018-01-17 MED ORDER — TORSEMIDE 20 MG PO TABS: 20.0000 mg | ORAL_TABLET | Freq: Every day | ORAL | 1 refills | Status: DC | PRN

## 2018-01-17 MED ORDER — COLLAGENASE 250 UNIT/GM EX OINT: TOPICAL_OINTMENT | Freq: Every day | CUTANEOUS | 0 refills | Status: DC

## 2018-01-17 MED ORDER — ASPIRIN 81 MG PO CHEW: 81.0000 mg | CHEWABLE_TABLET | Freq: Every day | ORAL | 2 refills | Status: DC

## 2018-01-17 NOTE — Anesthesia Postprocedure Evaluation (Signed)
Anesthesia Post Note  Patient: Crystal Yates  Procedure(s) Performed: COLONOSCOPY WITH PROPOFOL (N/A ) SCLEROTHERAPY POLYPECTOMY HEMOSTASIS CONTROL     Patient location during evaluation: PACU Anesthesia Type: MAC Level of consciousness: awake and alert Pain management: pain level controlled Vital Signs Assessment: post-procedure vital signs reviewed and stable Respiratory status: spontaneous breathing, nonlabored ventilation, respiratory function stable and patient connected to nasal cannula oxygen Cardiovascular status: stable and blood pressure returned to baseline Postop Assessment: no apparent nausea or vomiting Anesthetic complications: no    Last Vitals:  Vitals:   01/16/18 2300 01/17/18 0335  BP: (!) 123/53 (!) 124/57  Pulse: 71 80  Resp: 18 18  Temp: 36.7 C 36.4 C  SpO2: 97% 97%    Last Pain:  Vitals:   01/17/18 0622  TempSrc:   PainSc: 4                  Adonia Porada P Masin Shatto

## 2018-01-17 NOTE — Progress Notes (Signed)
Physical Therapy Treatment Patient Details Name: Crystal Yates MRN: [id number] DOB: Jul 13, 1955 Today's Date: 01/17/2018    History of Present Illness Pt adm with recurrent acute respiratory failure and acute on chronic heart failure. Intubated 10/10-10/11. PMH - chf, cad, dm, htn, obesity, copd, ckd, laparotomy for SBO    PT Comments    Patient progressing with therapy, demonstrating increased tolerance and independence with gait. rec HHPT upon d/c and progression to OP PT if needed. Patient reports feeling ready to return home. Supervision level for mobility today, satting well on 2L.    Follow Up Recommendations  Home health PT;Supervision - Intermittent     Equipment Recommendations  None recommended by PT    Recommendations for Other Services       Precautions / Restrictions Precautions Precautions: Fall Restrictions Weight Bearing Restrictions: No    Mobility  Bed Mobility Overal bed mobility: Modified Independent                Transfers Overall transfer level: Modified independent                  Ambulation/Gait Ambulation/Gait assistance: Supervision Gait Distance (Feet): 180 Feet Assistive device: Rolling walker (2 wheeled) Gait Pattern/deviations: Step-through pattern;Decreased stride length Gait velocity: decreased   General Gait Details: pt improved tolernece, satting well on 2L, DOE 1/4.    Stairs             Wheelchair Mobility    Modified Rankin (Stroke Patients Only)       Balance Overall balance assessment: Mild deficits observed, not formally tested                                          Cognition Arousal/Alertness: Awake/alert Behavior During Therapy: WFL for tasks assessed/performed Overall Cognitive Status: Within Functional Limits for tasks assessed                                        Exercises      General Comments        Pertinent Vitals/Pain Pain  Assessment: No/denies pain    Home Living                      Prior Function            PT Goals (current goals can now be found in the care plan section) Acute Rehab PT Goals Patient Stated Goal: return home PT Goal Formulation: With patient/family Time For Goal Achievement: 01/20/18 Progress towards PT goals: Progressing toward goals    Frequency    Min 3X/week      PT Plan Current plan remains appropriate    Co-evaluation              AM-PAC PT "6 Clicks" Daily Activity  Outcome Measure  Difficulty turning over in bed (including adjusting bedclothes, sheets and blankets)?: None Difficulty moving from lying on back to sitting on the side of the bed? : None Difficulty sitting down on and standing up from a chair with arms (e.g., wheelchair, bedside commode, etc,.)?: None Help needed moving to and from a bed to chair (including a wheelchair)?: A Little Help needed walking in hospital room?: A Little Help needed climbing 3-5 steps with a railing? : A Little 6  Click Score: 21    End of Session Equipment Utilized During Treatment: Oxygen Activity Tolerance: Patient tolerated treatment well Patient left: in chair;with call bell/phone within reach Nurse Communication: Mobility status PT Visit Diagnosis: Other abnormalities of gait and mobility (R26.89)     Time: 1011-1030 PT Time Calculation (min) (ACUTE ONLY): 19 min  Charges:  $Gait Training: 8-22 mins                     Reinaldo Berber, PT, DPT Acute Rehabilitation Services Pager: [phone] Office: [place] 01/17/2018, 12:32 PM

## 2018-01-17 NOTE — Progress Notes (Signed)
   Subjective: Patient was seen and evaluated at bedside on morning rounds. Had some bright blood per rectum. No dizziness. No abdominal pain, no pain or burning with peeing. Has no shortness of breath.    Objective:  Vital signs in last 24 hours: Vitals:   01/16/18 1909 01/16/18 2000 01/16/18 2300 01/17/18 0335  BP: (!) 111/50  (!) 123/53 (!) 124/57  Pulse: 67  71 80  Resp: 18  18 18   Temp: 97.6 F (36.4 C)  98.1 F (36.7 C) 97.6 F (36.4 C)  TempSrc: Oral  Oral Oral  SpO2: 98% 97% 97% 97%  Weight:    98.3 kg  Height:       Physical Exam  Constitutional: She is well-developed, well-nourished, and in no distress. No distress.  HENT:  Head: Normocephalic and atraumatic.  Cardiovascular: Normal rate, regular rhythm, normal heart sounds and intact distal pulses.  No murmur heard. No JVD Pulmonary/Chest: Effort normal. No respiratory distress. Decreased breath sound. She has no wheezes. She has no rales. She exhibits no tenderness.  Abdominal: Soft. Bowel sounds are normal. She exhibits no distension. Surgery incision dressing is intact Musculoskeletal: She exhibits 1 + bilateral pitting edema.  Skin: Skin is warm and dry.  Psychiatric: Normal mood and affect. Normal behavior   Assessment/Plan:  Principal Problem:   Acute on chronic respiratory failure with hypoxia and hypercapnia (HCC) Active Problems:   Adrenal insufficiency (HCC)   Type 2 diabetes mellitus without complication, with long-term current use of insulin (HCC)   Acute on chronic combined systolic and diastolic heart failure (HCC)   Iron deficiency   Acute on chronic respiratory failure with hypoxia and hypercapnia:Improved. Shortness of breath: In setting of COPD, Volume overload vs anxiety Improved. Has been saturating at 95% with 2 li nasal O2.  -Continue home O2 at home for activity as needed  Acute on Chronic combined systolic and diastolic heart failure:  Euvolemic on exam today despite  decreasing torsemide to 20 mg yesterday.  Dry weight today at discharge: 216 lb. -Recommend to take daily at home and follow the instruction about weight gain -Will DC today with 20 mg torsemide daily as needed as well as potassium with it, follow-up with PCP  Blood per rectum:Had another episode of bleeding. Otherwise asymptomatic. Likely secondary to hemorrhoid, (also per recent colonoscopy) Hemoglobin vital signs are stable. -Follow-up as outpatient evaluate for intervention for hemorrhoid  -Follow-up with PCP for CBC  Asymptomatic UTI: No hematuria after removing the catheter. Biotic needed at this time -Follow-up with PCP to reevaluate and repeat UA   Hypokalemia: Resolved -Continue potassium supplement on days that take torsemide -Follow-up with PCP for BMP  CADs/p stent:Asymptomatic and stable -RestartASA and Plavix 3 days.  Follow-up with primary care cardiologist to consider if patient can be off of Plavix as her last stent was done 1 year ago - c/wLipitor   COPD:stable. O2 sat: 95% Stable -Discharge to c/w Continue with Duoneb nebulizer,PulmicortandIS -Outpatient pulmonary rehab is recommended -Get home healthafter Discharge  [id number],[id number] complication:BG today: 101 Stable with Tx. -C/whome meds after discharge  HTN:BP today:Normotensive -Discharge with home meds   Secondary adrenal insufficiency: -Continue treatment with hydrocortisone 15 mg at a.m. at 5 mg p.m. (Florinef discontinued) -Follow-up with endocrinologist  Hypothyroidism:Stable -Continue synthroid 150 mcg QD  Panic attackand anxiety:Controlled. No more episode -C/w Cymbalta 60 mg QD -PRN alprazolam at home   Dispo: Discharge today  Dewayne Hatch, MD 01/17/2018, 6:20 AM Pager: [phone]

## 2018-01-17 NOTE — Discharge Instructions (Signed)
Thank you for allowing [place] taking care of you at [facility].  We are glad that you are doing better.  Please make sure to follow-up with your primary care next week as a scheduled for you to do the blood test and follow-up with your medication.  As we talked before, please hold your aspirin and Plavix for next 2 days and then resume and follow-up with primary care or cardiologist decide about continuing Plavix. Please weight yourself daily at the same time, with same close and same scale.  If you gain more than 3 pounds in a day or 5 pounds a week, take your torsemide as well as potassium and talk to your doctor. He can call us back at [phone] if you have any questions or concern. As always, if develop any serious symptoms, please seek medical attention at the emergency room.  Thanks Dr. Myrtie Hawk

## 2018-01-18 ENCOUNTER — Encounter (HOSPITAL_COMMUNITY): Payer: Self-pay | Admitting: Gastroenterology

## 2018-01-18 ENCOUNTER — Encounter: Payer: Self-pay | Admitting: Gastroenterology

## 2018-01-19 DIAGNOSIS — I10 Essential (primary) hypertension: Secondary | ICD-10-CM | POA: Diagnosis not present

## 2018-01-19 DIAGNOSIS — I200 Unstable angina: Secondary | ICD-10-CM | POA: Diagnosis not present

## 2018-01-19 DIAGNOSIS — G43019 Migraine without aura, intractable, without status migrainosus: Secondary | ICD-10-CM | POA: Diagnosis not present

## 2018-01-19 DIAGNOSIS — G4733 Obstructive sleep apnea (adult) (pediatric): Secondary | ICD-10-CM | POA: Diagnosis not present

## 2018-01-19 DIAGNOSIS — I472 Ventricular tachycardia: Secondary | ICD-10-CM | POA: Diagnosis not present

## 2018-01-19 DIAGNOSIS — S301XXD Contusion of abdominal wall, subsequent encounter: Secondary | ICD-10-CM | POA: Diagnosis not present

## 2018-01-19 DIAGNOSIS — J9601 Acute respiratory failure with hypoxia: Secondary | ICD-10-CM | POA: Diagnosis not present

## 2018-01-19 DIAGNOSIS — I255 Ischemic cardiomyopathy: Secondary | ICD-10-CM | POA: Diagnosis not present

## 2018-01-19 DIAGNOSIS — Z48815 Encounter for surgical aftercare following surgery on the digestive system: Secondary | ICD-10-CM | POA: Diagnosis not present

## 2018-01-19 DIAGNOSIS — M3500 Sicca syndrome, unspecified: Secondary | ICD-10-CM | POA: Diagnosis not present

## 2018-01-19 DIAGNOSIS — K922 Gastrointestinal hemorrhage, unspecified: Secondary | ICD-10-CM | POA: Diagnosis not present

## 2018-01-19 DIAGNOSIS — I2510 Atherosclerotic heart disease of native coronary artery without angina pectoris: Secondary | ICD-10-CM | POA: Diagnosis not present

## 2018-01-19 DIAGNOSIS — R1900 Intra-abdominal and pelvic swelling, mass and lump, unspecified site: Secondary | ICD-10-CM | POA: Diagnosis not present

## 2018-01-19 DIAGNOSIS — J449 Chronic obstructive pulmonary disease, unspecified: Secondary | ICD-10-CM | POA: Diagnosis not present

## 2018-01-19 DIAGNOSIS — F339 Major depressive disorder, recurrent, unspecified: Secondary | ICD-10-CM | POA: Diagnosis not present

## 2018-01-19 DIAGNOSIS — I5023 Acute on chronic systolic (congestive) heart failure: Secondary | ICD-10-CM | POA: Diagnosis not present

## 2018-01-21 DIAGNOSIS — I5023 Acute on chronic systolic (congestive) heart failure: Secondary | ICD-10-CM | POA: Diagnosis not present

## 2018-01-21 DIAGNOSIS — J449 Chronic obstructive pulmonary disease, unspecified: Secondary | ICD-10-CM | POA: Diagnosis not present

## 2018-01-21 DIAGNOSIS — I472 Ventricular tachycardia: Secondary | ICD-10-CM | POA: Diagnosis not present

## 2018-01-21 DIAGNOSIS — I255 Ischemic cardiomyopathy: Secondary | ICD-10-CM | POA: Diagnosis not present

## 2018-01-21 DIAGNOSIS — Z48815 Encounter for surgical aftercare following surgery on the digestive system: Secondary | ICD-10-CM | POA: Diagnosis not present

## 2018-01-21 DIAGNOSIS — R1900 Intra-abdominal and pelvic swelling, mass and lump, unspecified site: Secondary | ICD-10-CM | POA: Diagnosis not present

## 2018-01-21 DIAGNOSIS — G43019 Migraine without aura, intractable, without status migrainosus: Secondary | ICD-10-CM | POA: Diagnosis not present

## 2018-01-21 DIAGNOSIS — F339 Major depressive disorder, recurrent, unspecified: Secondary | ICD-10-CM | POA: Diagnosis not present

## 2018-01-21 DIAGNOSIS — K922 Gastrointestinal hemorrhage, unspecified: Secondary | ICD-10-CM | POA: Diagnosis not present

## 2018-01-21 DIAGNOSIS — S301XXD Contusion of abdominal wall, subsequent encounter: Secondary | ICD-10-CM | POA: Diagnosis not present

## 2018-01-21 DIAGNOSIS — I10 Essential (primary) hypertension: Secondary | ICD-10-CM | POA: Diagnosis not present

## 2018-01-21 DIAGNOSIS — G4733 Obstructive sleep apnea (adult) (pediatric): Secondary | ICD-10-CM | POA: Diagnosis not present

## 2018-01-21 DIAGNOSIS — I200 Unstable angina: Secondary | ICD-10-CM | POA: Diagnosis not present

## 2018-01-21 DIAGNOSIS — J9601 Acute respiratory failure with hypoxia: Secondary | ICD-10-CM | POA: Diagnosis not present

## 2018-01-21 DIAGNOSIS — I2510 Atherosclerotic heart disease of native coronary artery without angina pectoris: Secondary | ICD-10-CM | POA: Diagnosis not present

## 2018-01-21 DIAGNOSIS — M3500 Sicca syndrome, unspecified: Secondary | ICD-10-CM | POA: Diagnosis not present

## 2018-01-22 DIAGNOSIS — M3500 Sicca syndrome, unspecified: Secondary | ICD-10-CM | POA: Diagnosis not present

## 2018-01-22 DIAGNOSIS — K644 Residual hemorrhoidal skin tags: Secondary | ICD-10-CM | POA: Clinically undetermined

## 2018-01-22 DIAGNOSIS — G4733 Obstructive sleep apnea (adult) (pediatric): Secondary | ICD-10-CM | POA: Diagnosis not present

## 2018-01-22 DIAGNOSIS — K922 Gastrointestinal hemorrhage, unspecified: Secondary | ICD-10-CM | POA: Diagnosis not present

## 2018-01-22 DIAGNOSIS — Z48815 Encounter for surgical aftercare following surgery on the digestive system: Secondary | ICD-10-CM | POA: Diagnosis not present

## 2018-01-22 DIAGNOSIS — I472 Ventricular tachycardia: Secondary | ICD-10-CM | POA: Diagnosis not present

## 2018-01-22 DIAGNOSIS — G43019 Migraine without aura, intractable, without status migrainosus: Secondary | ICD-10-CM | POA: Diagnosis not present

## 2018-01-22 DIAGNOSIS — I200 Unstable angina: Secondary | ICD-10-CM | POA: Diagnosis not present

## 2018-01-22 DIAGNOSIS — R1900 Intra-abdominal and pelvic swelling, mass and lump, unspecified site: Secondary | ICD-10-CM | POA: Diagnosis not present

## 2018-01-22 DIAGNOSIS — I10 Essential (primary) hypertension: Secondary | ICD-10-CM | POA: Diagnosis not present

## 2018-01-22 DIAGNOSIS — S301XXD Contusion of abdominal wall, subsequent encounter: Secondary | ICD-10-CM | POA: Diagnosis not present

## 2018-01-22 DIAGNOSIS — F339 Major depressive disorder, recurrent, unspecified: Secondary | ICD-10-CM | POA: Diagnosis not present

## 2018-01-22 DIAGNOSIS — I255 Ischemic cardiomyopathy: Secondary | ICD-10-CM | POA: Diagnosis not present

## 2018-01-22 DIAGNOSIS — K635 Polyp of colon: Secondary | ICD-10-CM | POA: Diagnosis present

## 2018-01-22 DIAGNOSIS — J9601 Acute respiratory failure with hypoxia: Secondary | ICD-10-CM | POA: Diagnosis not present

## 2018-01-22 DIAGNOSIS — I2510 Atherosclerotic heart disease of native coronary artery without angina pectoris: Secondary | ICD-10-CM | POA: Diagnosis not present

## 2018-01-22 DIAGNOSIS — K5730 Diverticulosis of large intestine without perforation or abscess without bleeding: Secondary | ICD-10-CM | POA: Diagnosis present

## 2018-01-22 DIAGNOSIS — I5023 Acute on chronic systolic (congestive) heart failure: Secondary | ICD-10-CM | POA: Diagnosis not present

## 2018-01-22 DIAGNOSIS — K648 Other hemorrhoids: Secondary | ICD-10-CM

## 2018-01-22 DIAGNOSIS — J449 Chronic obstructive pulmonary disease, unspecified: Secondary | ICD-10-CM | POA: Diagnosis not present

## 2018-01-23 DIAGNOSIS — I10 Essential (primary) hypertension: Secondary | ICD-10-CM | POA: Diagnosis not present

## 2018-01-23 DIAGNOSIS — I200 Unstable angina: Secondary | ICD-10-CM | POA: Diagnosis not present

## 2018-01-23 DIAGNOSIS — F339 Major depressive disorder, recurrent, unspecified: Secondary | ICD-10-CM | POA: Diagnosis not present

## 2018-01-23 DIAGNOSIS — I5023 Acute on chronic systolic (congestive) heart failure: Secondary | ICD-10-CM | POA: Diagnosis not present

## 2018-01-23 DIAGNOSIS — I5022 Chronic systolic (congestive) heart failure: Secondary | ICD-10-CM | POA: Diagnosis not present

## 2018-01-23 DIAGNOSIS — K922 Gastrointestinal hemorrhage, unspecified: Secondary | ICD-10-CM | POA: Diagnosis not present

## 2018-01-23 DIAGNOSIS — Z48815 Encounter for surgical aftercare following surgery on the digestive system: Secondary | ICD-10-CM | POA: Diagnosis not present

## 2018-01-23 DIAGNOSIS — T81718A Complication of other artery following a procedure, not elsewhere classified, initial encounter: Secondary | ICD-10-CM | POA: Diagnosis not present

## 2018-01-23 DIAGNOSIS — S301XXD Contusion of abdominal wall, subsequent encounter: Secondary | ICD-10-CM | POA: Diagnosis not present

## 2018-01-23 DIAGNOSIS — G43019 Migraine without aura, intractable, without status migrainosus: Secondary | ICD-10-CM | POA: Diagnosis not present

## 2018-01-23 DIAGNOSIS — Z794 Long term (current) use of insulin: Secondary | ICD-10-CM | POA: Diagnosis not present

## 2018-01-23 DIAGNOSIS — J449 Chronic obstructive pulmonary disease, unspecified: Secondary | ICD-10-CM | POA: Diagnosis not present

## 2018-01-23 DIAGNOSIS — R1900 Intra-abdominal and pelvic swelling, mass and lump, unspecified site: Secondary | ICD-10-CM | POA: Diagnosis not present

## 2018-01-23 DIAGNOSIS — M3500 Sicca syndrome, unspecified: Secondary | ICD-10-CM | POA: Diagnosis not present

## 2018-01-23 DIAGNOSIS — G4733 Obstructive sleep apnea (adult) (pediatric): Secondary | ICD-10-CM | POA: Diagnosis not present

## 2018-01-23 DIAGNOSIS — I255 Ischemic cardiomyopathy: Secondary | ICD-10-CM | POA: Diagnosis not present

## 2018-01-23 DIAGNOSIS — F331 Major depressive disorder, recurrent, moderate: Secondary | ICD-10-CM | POA: Diagnosis not present

## 2018-01-23 DIAGNOSIS — I2510 Atherosclerotic heart disease of native coronary artery without angina pectoris: Secondary | ICD-10-CM | POA: Diagnosis not present

## 2018-01-23 DIAGNOSIS — I472 Ventricular tachycardia: Secondary | ICD-10-CM | POA: Diagnosis not present

## 2018-01-23 DIAGNOSIS — J9601 Acute respiratory failure with hypoxia: Secondary | ICD-10-CM | POA: Diagnosis not present

## 2018-01-23 DIAGNOSIS — E119 Type 2 diabetes mellitus without complications: Secondary | ICD-10-CM | POA: Diagnosis not present

## 2018-01-24 DIAGNOSIS — I10 Essential (primary) hypertension: Secondary | ICD-10-CM | POA: Diagnosis not present

## 2018-01-24 DIAGNOSIS — K922 Gastrointestinal hemorrhage, unspecified: Secondary | ICD-10-CM | POA: Diagnosis not present

## 2018-01-24 DIAGNOSIS — S301XXD Contusion of abdominal wall, subsequent encounter: Secondary | ICD-10-CM | POA: Diagnosis not present

## 2018-01-24 DIAGNOSIS — F339 Major depressive disorder, recurrent, unspecified: Secondary | ICD-10-CM | POA: Diagnosis not present

## 2018-01-24 DIAGNOSIS — J449 Chronic obstructive pulmonary disease, unspecified: Secondary | ICD-10-CM | POA: Diagnosis not present

## 2018-01-24 DIAGNOSIS — I472 Ventricular tachycardia: Secondary | ICD-10-CM | POA: Diagnosis not present

## 2018-01-24 DIAGNOSIS — R1900 Intra-abdominal and pelvic swelling, mass and lump, unspecified site: Secondary | ICD-10-CM | POA: Diagnosis not present

## 2018-01-24 DIAGNOSIS — M3500 Sicca syndrome, unspecified: Secondary | ICD-10-CM | POA: Diagnosis not present

## 2018-01-24 DIAGNOSIS — Z48815 Encounter for surgical aftercare following surgery on the digestive system: Secondary | ICD-10-CM | POA: Diagnosis not present

## 2018-01-24 DIAGNOSIS — I255 Ischemic cardiomyopathy: Secondary | ICD-10-CM | POA: Diagnosis not present

## 2018-01-24 DIAGNOSIS — I5023 Acute on chronic systolic (congestive) heart failure: Secondary | ICD-10-CM | POA: Diagnosis not present

## 2018-01-24 DIAGNOSIS — G43019 Migraine without aura, intractable, without status migrainosus: Secondary | ICD-10-CM | POA: Diagnosis not present

## 2018-01-24 DIAGNOSIS — J9601 Acute respiratory failure with hypoxia: Secondary | ICD-10-CM | POA: Diagnosis not present

## 2018-01-24 DIAGNOSIS — I200 Unstable angina: Secondary | ICD-10-CM | POA: Diagnosis not present

## 2018-01-24 DIAGNOSIS — I2510 Atherosclerotic heart disease of native coronary artery without angina pectoris: Secondary | ICD-10-CM | POA: Diagnosis not present

## 2018-01-24 DIAGNOSIS — G4733 Obstructive sleep apnea (adult) (pediatric): Secondary | ICD-10-CM | POA: Diagnosis not present

## 2018-01-26 ENCOUNTER — Institutional Professional Consult (permissible substitution): Payer: Federal, State, Local not specified - PPO | Admitting: Pulmonary Disease

## 2018-01-26 DIAGNOSIS — R1900 Intra-abdominal and pelvic swelling, mass and lump, unspecified site: Secondary | ICD-10-CM | POA: Diagnosis not present

## 2018-01-26 DIAGNOSIS — Z48815 Encounter for surgical aftercare following surgery on the digestive system: Secondary | ICD-10-CM | POA: Diagnosis not present

## 2018-01-26 DIAGNOSIS — J449 Chronic obstructive pulmonary disease, unspecified: Secondary | ICD-10-CM | POA: Diagnosis not present

## 2018-01-26 DIAGNOSIS — I2510 Atherosclerotic heart disease of native coronary artery without angina pectoris: Secondary | ICD-10-CM | POA: Diagnosis not present

## 2018-01-26 DIAGNOSIS — F339 Major depressive disorder, recurrent, unspecified: Secondary | ICD-10-CM | POA: Diagnosis not present

## 2018-01-26 DIAGNOSIS — G43019 Migraine without aura, intractable, without status migrainosus: Secondary | ICD-10-CM | POA: Diagnosis not present

## 2018-01-26 DIAGNOSIS — M3500 Sicca syndrome, unspecified: Secondary | ICD-10-CM | POA: Diagnosis not present

## 2018-01-26 DIAGNOSIS — I10 Essential (primary) hypertension: Secondary | ICD-10-CM | POA: Diagnosis not present

## 2018-01-26 DIAGNOSIS — I472 Ventricular tachycardia: Secondary | ICD-10-CM | POA: Diagnosis not present

## 2018-01-26 DIAGNOSIS — I255 Ischemic cardiomyopathy: Secondary | ICD-10-CM | POA: Diagnosis not present

## 2018-01-26 DIAGNOSIS — I200 Unstable angina: Secondary | ICD-10-CM | POA: Diagnosis not present

## 2018-01-26 DIAGNOSIS — G4733 Obstructive sleep apnea (adult) (pediatric): Secondary | ICD-10-CM | POA: Diagnosis not present

## 2018-01-26 DIAGNOSIS — I5023 Acute on chronic systolic (congestive) heart failure: Secondary | ICD-10-CM | POA: Diagnosis not present

## 2018-01-26 DIAGNOSIS — S301XXD Contusion of abdominal wall, subsequent encounter: Secondary | ICD-10-CM | POA: Diagnosis not present

## 2018-01-26 DIAGNOSIS — J9601 Acute respiratory failure with hypoxia: Secondary | ICD-10-CM | POA: Diagnosis not present

## 2018-01-26 DIAGNOSIS — K922 Gastrointestinal hemorrhage, unspecified: Secondary | ICD-10-CM | POA: Diagnosis not present

## 2018-01-29 DIAGNOSIS — S301XXD Contusion of abdominal wall, subsequent encounter: Secondary | ICD-10-CM | POA: Diagnosis not present

## 2018-01-29 DIAGNOSIS — I10 Essential (primary) hypertension: Secondary | ICD-10-CM | POA: Diagnosis not present

## 2018-01-29 DIAGNOSIS — K922 Gastrointestinal hemorrhage, unspecified: Secondary | ICD-10-CM | POA: Diagnosis not present

## 2018-01-29 DIAGNOSIS — R1900 Intra-abdominal and pelvic swelling, mass and lump, unspecified site: Secondary | ICD-10-CM | POA: Diagnosis not present

## 2018-01-29 DIAGNOSIS — G43019 Migraine without aura, intractable, without status migrainosus: Secondary | ICD-10-CM | POA: Diagnosis not present

## 2018-01-29 DIAGNOSIS — I2510 Atherosclerotic heart disease of native coronary artery without angina pectoris: Secondary | ICD-10-CM | POA: Diagnosis not present

## 2018-01-29 DIAGNOSIS — I255 Ischemic cardiomyopathy: Secondary | ICD-10-CM | POA: Diagnosis not present

## 2018-01-29 DIAGNOSIS — J9601 Acute respiratory failure with hypoxia: Secondary | ICD-10-CM | POA: Diagnosis not present

## 2018-01-29 DIAGNOSIS — F339 Major depressive disorder, recurrent, unspecified: Secondary | ICD-10-CM | POA: Diagnosis not present

## 2018-01-29 DIAGNOSIS — J449 Chronic obstructive pulmonary disease, unspecified: Secondary | ICD-10-CM | POA: Diagnosis not present

## 2018-01-29 DIAGNOSIS — G4733 Obstructive sleep apnea (adult) (pediatric): Secondary | ICD-10-CM | POA: Diagnosis not present

## 2018-01-29 DIAGNOSIS — I5023 Acute on chronic systolic (congestive) heart failure: Secondary | ICD-10-CM | POA: Diagnosis not present

## 2018-01-29 DIAGNOSIS — I200 Unstable angina: Secondary | ICD-10-CM | POA: Diagnosis not present

## 2018-01-29 DIAGNOSIS — I472 Ventricular tachycardia: Secondary | ICD-10-CM | POA: Diagnosis not present

## 2018-01-29 DIAGNOSIS — M3500 Sicca syndrome, unspecified: Secondary | ICD-10-CM | POA: Diagnosis not present

## 2018-01-29 DIAGNOSIS — Z48815 Encounter for surgical aftercare following surgery on the digestive system: Secondary | ICD-10-CM | POA: Diagnosis not present

## 2018-01-30 DIAGNOSIS — Z139 Encounter for screening, unspecified: Secondary | ICD-10-CM | POA: Diagnosis not present

## 2018-01-30 DIAGNOSIS — I200 Unstable angina: Secondary | ICD-10-CM | POA: Diagnosis not present

## 2018-01-30 DIAGNOSIS — Z48815 Encounter for surgical aftercare following surgery on the digestive system: Secondary | ICD-10-CM | POA: Diagnosis not present

## 2018-01-30 DIAGNOSIS — I10 Essential (primary) hypertension: Secondary | ICD-10-CM | POA: Diagnosis not present

## 2018-01-30 DIAGNOSIS — I472 Ventricular tachycardia: Secondary | ICD-10-CM | POA: Diagnosis not present

## 2018-01-30 DIAGNOSIS — K922 Gastrointestinal hemorrhage, unspecified: Secondary | ICD-10-CM | POA: Diagnosis not present

## 2018-01-30 DIAGNOSIS — G43019 Migraine without aura, intractable, without status migrainosus: Secondary | ICD-10-CM | POA: Diagnosis not present

## 2018-01-30 DIAGNOSIS — J310 Chronic rhinitis: Secondary | ICD-10-CM | POA: Diagnosis not present

## 2018-01-30 DIAGNOSIS — J449 Chronic obstructive pulmonary disease, unspecified: Secondary | ICD-10-CM | POA: Diagnosis not present

## 2018-01-30 DIAGNOSIS — F339 Major depressive disorder, recurrent, unspecified: Secondary | ICD-10-CM | POA: Diagnosis not present

## 2018-01-30 DIAGNOSIS — M3500 Sicca syndrome, unspecified: Secondary | ICD-10-CM | POA: Diagnosis not present

## 2018-01-30 DIAGNOSIS — I255 Ischemic cardiomyopathy: Secondary | ICD-10-CM | POA: Diagnosis not present

## 2018-01-30 DIAGNOSIS — R269 Unspecified abnormalities of gait and mobility: Secondary | ICD-10-CM | POA: Diagnosis not present

## 2018-01-30 DIAGNOSIS — R1900 Intra-abdominal and pelvic swelling, mass and lump, unspecified site: Secondary | ICD-10-CM | POA: Diagnosis not present

## 2018-01-30 DIAGNOSIS — I2510 Atherosclerotic heart disease of native coronary artery without angina pectoris: Secondary | ICD-10-CM | POA: Diagnosis not present

## 2018-01-30 DIAGNOSIS — S301XXD Contusion of abdominal wall, subsequent encounter: Secondary | ICD-10-CM | POA: Diagnosis not present

## 2018-01-30 DIAGNOSIS — G4733 Obstructive sleep apnea (adult) (pediatric): Secondary | ICD-10-CM | POA: Diagnosis not present

## 2018-01-30 DIAGNOSIS — I5023 Acute on chronic systolic (congestive) heart failure: Secondary | ICD-10-CM | POA: Diagnosis not present

## 2018-01-30 DIAGNOSIS — E1159 Type 2 diabetes mellitus with other circulatory complications: Secondary | ICD-10-CM | POA: Diagnosis not present

## 2018-01-30 DIAGNOSIS — J9601 Acute respiratory failure with hypoxia: Secondary | ICD-10-CM | POA: Diagnosis not present

## 2018-01-31 ENCOUNTER — Encounter: Payer: Self-pay | Admitting: Internal Medicine

## 2018-01-31 ENCOUNTER — Ambulatory Visit (INDEPENDENT_AMBULATORY_CARE_PROVIDER_SITE_OTHER): Payer: Federal, State, Local not specified - PPO | Admitting: Cardiology

## 2018-01-31 ENCOUNTER — Encounter: Payer: Self-pay | Admitting: Cardiology

## 2018-01-31 ENCOUNTER — Ambulatory Visit (INDEPENDENT_AMBULATORY_CARE_PROVIDER_SITE_OTHER)
Admission: RE | Admit: 2018-01-31 | Discharge: 2018-01-31 | Disposition: A | Discharge status: Home / Self Care | Payer: Federal, State, Local not specified - PPO | Source: Ambulatory Visit | Attending: Internal Medicine | Admitting: Internal Medicine

## 2018-01-31 ENCOUNTER — Ambulatory Visit (INDEPENDENT_AMBULATORY_CARE_PROVIDER_SITE_OTHER): Payer: Federal, State, Local not specified - PPO | Admitting: Internal Medicine

## 2018-01-31 VITALS — BP 120/68 | HR 103 | Ht 62.0 in | Wt 212.0 lb

## 2018-01-31 DIAGNOSIS — J9611 Chronic respiratory failure with hypoxia: Secondary | ICD-10-CM

## 2018-01-31 DIAGNOSIS — R0602 Shortness of breath: Secondary | ICD-10-CM | POA: Diagnosis not present

## 2018-01-31 DIAGNOSIS — J9601 Acute respiratory failure with hypoxia: Secondary | ICD-10-CM | POA: Diagnosis not present

## 2018-01-31 DIAGNOSIS — I5023 Acute on chronic systolic (congestive) heart failure: Secondary | ICD-10-CM

## 2018-01-31 DIAGNOSIS — K922 Gastrointestinal hemorrhage, unspecified: Secondary | ICD-10-CM | POA: Diagnosis not present

## 2018-01-31 DIAGNOSIS — K625 Hemorrhage of anus and rectum: Secondary | ICD-10-CM

## 2018-01-31 DIAGNOSIS — I472 Ventricular tachycardia: Secondary | ICD-10-CM | POA: Diagnosis not present

## 2018-01-31 DIAGNOSIS — J9612 Chronic respiratory failure with hypercapnia: Secondary | ICD-10-CM | POA: Diagnosis not present

## 2018-01-31 DIAGNOSIS — S301XXD Contusion of abdominal wall, subsequent encounter: Secondary | ICD-10-CM | POA: Diagnosis not present

## 2018-01-31 DIAGNOSIS — F17210 Nicotine dependence, cigarettes, uncomplicated: Secondary | ICD-10-CM

## 2018-01-31 DIAGNOSIS — I2510 Atherosclerotic heart disease of native coronary artery without angina pectoris: Secondary | ICD-10-CM | POA: Diagnosis not present

## 2018-01-31 DIAGNOSIS — J449 Chronic obstructive pulmonary disease, unspecified: Secondary | ICD-10-CM

## 2018-01-31 DIAGNOSIS — I255 Ischemic cardiomyopathy: Secondary | ICD-10-CM | POA: Diagnosis not present

## 2018-01-31 DIAGNOSIS — G43019 Migraine without aura, intractable, without status migrainosus: Secondary | ICD-10-CM | POA: Diagnosis not present

## 2018-01-31 DIAGNOSIS — D649 Anemia, unspecified: Secondary | ICD-10-CM

## 2018-01-31 DIAGNOSIS — M3500 Sicca syndrome, unspecified: Secondary | ICD-10-CM | POA: Diagnosis not present

## 2018-01-31 DIAGNOSIS — I10 Essential (primary) hypertension: Secondary | ICD-10-CM | POA: Diagnosis not present

## 2018-01-31 DIAGNOSIS — R05 Cough: Secondary | ICD-10-CM | POA: Diagnosis not present

## 2018-01-31 DIAGNOSIS — I200 Unstable angina: Secondary | ICD-10-CM | POA: Diagnosis not present

## 2018-01-31 DIAGNOSIS — Z48815 Encounter for surgical aftercare following surgery on the digestive system: Secondary | ICD-10-CM | POA: Diagnosis not present

## 2018-01-31 DIAGNOSIS — R1900 Intra-abdominal and pelvic swelling, mass and lump, unspecified site: Secondary | ICD-10-CM | POA: Diagnosis not present

## 2018-01-31 DIAGNOSIS — G4733 Obstructive sleep apnea (adult) (pediatric): Secondary | ICD-10-CM | POA: Diagnosis not present

## 2018-01-31 DIAGNOSIS — F339 Major depressive disorder, recurrent, unspecified: Secondary | ICD-10-CM | POA: Diagnosis not present

## 2018-01-31 MED ORDER — TIOTROPIUM BROMIDE-OLODATEROL 2.5-2.5 MCG/ACT IN AERS: 2.0000 | INHALATION_SPRAY | Freq: Every day | RESPIRATORY_TRACT | 0 refills | Status: DC

## 2018-01-31 MED ORDER — TIOTROPIUM BROMIDE-OLODATEROL 2.5-2.5 MCG/ACT IN AERS: 2.0000 | INHALATION_SPRAY | Freq: Every day | RESPIRATORY_TRACT | 11 refills | Status: DC

## 2018-01-31 MED ORDER — ALBUTEROL SULFATE HFA 108 (90 BASE) MCG/ACT IN AERS: INHALATION_SPRAY | RESPIRATORY_TRACT | 11 refills | Status: AC

## 2018-01-31 NOTE — Progress Notes (Signed)
Cardiology Office Note:    Date:  01/31/2018   ID:  Crystal Yates, Crystal Yates 1956-03-23, MRN [id number]  PCP:  Dortha Kern, [place]  Cardiologist:  Jenne Campus, MD    Referring MD: Dortha Kern, [place]   Chief Complaint  Patient presents with  . Hospitalization Follow-up  Doing better  History of Present Illness:    Crystal Yates is a 62 y.o. female with very complex past medical history.  That include coronary artery disease, both systolic and diastolic congestive heart failure, diabetes, dyslipidemia, COPD.  Recently she was in the hospital she was anemic she did have colonoscopy done which showed hemorrhoids that were bleeding her dual antiplatelet therapy has been withdrawn.  However upon discharge she was put back on Plavix.  In December she had balloon angioplasty of the right coronary artery therefore ideally should be on dual antiplatelet therapy.  However Crystal Yates seems to be doing well shortness of breath bothers her a lot there is no swelling of lower extremities she always sleeps in the recliner.  Her weight is actually done from 2019 at the time of discharge from the hospital to 2012 today.  Past Medical History:  Diagnosis Date  . Acute on chronic systolic heart failure exacerbation(HCC) 04/08/2016  . Arthritis   . CAD in native artery    a. Prior LAD stenting based on cath. b. RCA stenting 03/2016 x2.  . Chronic combined systolic and diastolic CHF (congestive heart failure) ([place])   . CKD (chronic kidney disease), stage II   . COPD (chronic obstructive pulmonary disease) ([place])   . Diabetes mellitus without complication ([place])   . Dyspnea   . Hashimoto's thyroiditis   . Hyperlipidemia   . Hypertension   . Myocardial infarction ([place])   . On home oxygen therapy    "2L; 24/7" (10/23/2017)  . Secondary adrenal insufficiency ([place])   . Thyroid disease   . Tobacco abuse     Past Surgical History:  Procedure Laterality Date  . ABDOMINAL SURGERY    .  CESAREAN SECTION    . CHOLECYSTECTOMY    . COLON RESECTION    . COLONOSCOPY WITH PROPOFOL N/A 01/16/2018   Procedure: COLONOSCOPY WITH PROPOFOL;  Surgeon: Rush Landmark Telford Nab., MD;  Location: [facility];  Service: Gastroenterology;  Laterality: N/A;  . CORONARY BALLOON ANGIOPLASTY N/A 10/13/2017   Procedure: CORONARY BALLOON ANGIOPLASTY;  Surgeon: Belva Crome, MD;  Location: [facility];  Service: Cardiovascular;  Laterality: N/A;  . HERNIA MESH REMOVAL    . HERNIA REPAIR    . LEFT HEART CATH AND CORONARY ANGIOGRAPHY N/A 10/13/2016   Procedure: Left Heart Cath and Coronary Angiography;  Surgeon: Nelva Bush, MD;  Location: [facility];  Service: Cardiovascular;  Laterality: N/A;  . POLYPECTOMY  01/16/2018   Procedure: POLYPECTOMY;  Surgeon: Rush Landmark Telford Nab., MD;  Location: [facility];  Service: Gastroenterology;;  . RIGHT/LEFT HEART CATH AND CORONARY ANGIOGRAPHY N/A 10/13/2017   Procedure: RIGHT/LEFT HEART CATH AND CORONARY ANGIOGRAPHY;  Surgeon: Belva Crome, MD;  Location: [facility];  Service: Cardiovascular;  Laterality: N/A;  . SHOULDER ARTHROSCOPY    . TUBAL LIGATION      Current Medications: Current Meds  Medication Sig  . acetaminophen (TYLENOL) 325 MG tablet Take 2 tablets (650 mg total) by mouth every 4 (four) hours as needed for headache or mild pain.  Marland Kitchen albuterol (PROAIR HFA) 108 (90 Base) MCG/ACT inhaler Inhale 2 puffs into the lungs See admin instructions.  Inhale 2 puffs into the lungs every 4-6 hours as needed for shortness of breath or wheezing  . aspirin 81 MG chewable tablet Chew 1 tablet (81 mg total) by mouth daily.  Marland Kitchen atorvastatin (LIPITOR) 80 MG tablet Take 1 tablet (80 mg total) by mouth daily at 6 PM.  . CALCIUM PO Take 1 tablet by mouth daily.  . Cholecalciferol (VITAMIN D3) 2000 units capsule Take 2,000 Units by mouth daily.   . clopidogrel (PLAVIX) 75 MG tablet Take 1 tablet (75 mg total) by mouth daily.  . DULoxetine  (CYMBALTA) 60 MG capsule Take 60 mg by mouth daily.   Marland Kitchen EMGALITY 120 MG/ML SOAJ Inject 120 mg into the skin every 30 (thirty) days.   . hydrocortisone (ANUSOL-HC) 2.5 % rectal cream Place rectally 2 (two) times daily.  . Insulin Aspart, w/Niacinamide, (FIASP FLEXTOUCH) 100 UNIT/ML SOPN Inject 2-14 Units into the skin See admin instructions. Use three times day as needed before meals per sliding scale  . insulin degludec (TRESIBA FLEXTOUCH) 100 UNIT/ML SOPN FlexTouch Pen Inject 40 Units into the skin at bedtime.   Marland Kitchen ipratropium-albuterol (DUONEB) 0.5-2.5 (3) MG/3ML SOLN Take 3 mLs by nebulization 4 (four) times daily.  . Iron-FA-B Cmp-C-Biot-Probiotic (FUSION PLUS) CAPS Take 1 capsule by mouth daily.  Marland Kitchen levothyroxine (SYNTHROID, LEVOTHROID) 150 MCG tablet Take 150 mcg by mouth daily before breakfast.  . Magnesium 400 MG TABS Take 400 mg by mouth daily.   . metoprolol succinate (TOPROL-XL) 25 MG 24 hr tablet Take 0.5 tablets (12.5 mg total) by mouth daily.  . nitroGLYCERIN (NITROSTAT) 0.4 MG SL tablet Place 0.4 mg under the tongue every 5 (five) minutes as needed for chest pain.   Marland Kitchen oxybutynin (DITROPAN XL) 15 MG 24 hr tablet Take 15 mg by mouth at bedtime.  . polyethylene glycol (MIRALAX / GLYCOLAX) packet Take 17 g by mouth daily.  . potassium chloride SA (K-DUR,KLOR-CON) 20 MEQ tablet Take 1 tablet (20 mEq total) by mouth daily. Take when you take Torsemide.  . pramipexole (MIRAPEX) 1 MG tablet Take 1 mg by mouth 2 (two) times daily.  . promethazine (PHENERGAN) 12.5 MG tablet Take 12.5 mg by mouth every 6 (six) hours as needed for nausea.  Marland Kitchen torsemide (DEMADEX) 20 MG tablet Take 1 tablet (20 mg total) by mouth daily as needed. Dry weight 216 lb. Can take extra dose if you gain 3 lb in a day or 5 lb in 1 week  . TRELEGY ELLIPTA 100-62.5-25 MCG/INH AEPB Inhale 1 puff into the lungs daily.  . TRULICITY 1.5 IP/3.8SN SOPN Inject 1.5 mg into the skin every Sunday.      Allergies:    Hydroxychloroquine; Donepezil; Prednisone; Anticoagulant cit dext [acd formula a]; Bupropion; Metrizamide; Varenicline; and Tape   Social History   Socioeconomic History  . Marital status: Married    Spouse name: Not on file  . Number of children: Not on file  . Years of education: Not on file  . Highest education level: Not on file  Occupational History  . Not on file  Social Needs  . Financial resource strain: Not on file  . Food insecurity:    Worry: Not on file    Inability: Not on file  . Transportation needs:    Medical: Not on file    Non-medical: Not on file  Tobacco Use  . Smoking status: Current Every Day Smoker    Packs/day: 0.75    Years: 44.00    Pack years: 33.00  Types: Cigarettes  . Smokeless tobacco: Never Used  Substance and Sexual Activity  . Alcohol use: Yes    Alcohol/week: 7.0 standard drinks    Types: 7 Shots of liquor per week  . Drug use: Yes    Types: Marijuana    Comment: 10/23/2017 "had 1 ~ 1 month ago; nothing before or since"  . Sexual activity: Yes  Lifestyle  . Physical activity:    Days per week: Not on file    Minutes per session: Not on file  . Stress: Not on file  Relationships  . Social connections:    Talks on phone: Patient refused    Gets together: Patient refused    Attends religious service: Patient refused    Active member of club or organization: Patient refused    Attends meetings of clubs or organizations: Patient refused    Relationship status: Patient refused  Other Topics Concern  . Not on file  Social History Narrative  . Not on file     Family History: The patient's family history includes Diabetes in her father, sister, sister, and son; Stroke in her mother. ROS:   Please see the history of present illness.    All 14 point review of systems negative except as described per history of present illness  EKGs/Labs/Other Studies Reviewed:    EKG today showed sinus tachycardia rate 102 biatrial enlargement  possible inferior MI  Recent Labs: 07/07/2017: NT-Pro BNP 789 09/16/2017: TSH 2.218 01/04/2018: B Natriuretic Peptide 723.9 01/09/2018: ALT 20 01/16/2018: Magnesium 1.6 01/17/2018: BUN 19; Creatinine, Ser 1.21; Hemoglobin 10.4; Platelets 247; Potassium 4.2; Sodium 140  Recent Lipid Panel    Component Value Date/Time   CHOL 122 10/13/2016 0037   TRIG 68 10/13/2016 0037   HDL 34 (L) 10/13/2016 0037   CHOLHDL 3.6 10/13/2016 0037   VLDL 14 10/13/2016 0037   LDLCALC 74 10/13/2016 0037    Physical Exam:    VS:  BP 120/68   Pulse (!) 103   Ht 5\' 2"  (1.575 m)   Wt 212 lb (96.2 kg)   SpO2 93% Comment: 2 lt of O2  BMI 38.78 kg/m     Wt Readings from Last 3 Encounters:  01/31/18 212 lb (96.2 kg)  01/17/18 216 lb 11.4 oz (98.3 kg)  01/03/18 235 lb 1.6 oz (106.6 kg)     GEN:  Well nourished, well developed in no acute distress HEENT: Normal NECK: No JVD; No carotid bruits LYMPHATICS: No lymphadenopathy CARDIAC: RRR, no murmurs, no rubs, no gallops RESPIRATORY:  Clear to auscultation without rales, wheezing or rhonchi  ABDOMEN: Soft, non-tender, non-distended MUSCULOSKELETAL:  No edema; No deformity  SKIN: Warm and dry LOWER EXTREMITIES: no swelling NEUROLOGIC:  Alert and oriented x 3 PSYCHIATRIC:  Normal affect   ASSESSMENT:    1. Essential hypertension   2. Acute on chronic HFrEF (heart failure with reduced ejection fraction) ([place])   3. Anemia, unspecified type   4. Gastrointestinal hemorrhage associated with anorectal source    PLAN:    In order of problems listed above:  1. Essential hypertension blood pressure well controlled continue present management. 2. Congestive heart failure.  Clinically her weight is less I do not see have any swelling I do not see any JVD.  However I am worried he may be having subclinical component of congestive heart failure therefore I will check a proBNP and based on that I will decide about diuretic therapy. 3. Anemia we will check her  CBC today.  So far she is taking only Plavix ideally should be Plavix and aspirin however will wait for results of CBC before making decision. 4. Bleeding hemorrhoids.  That being controlled she does not have any recent bleeding.   Medication Adjustments/Labs and Tests Ordered: Current medicines are reviewed at length with the patient today.  Concerns regarding medicines are outlined above.  Orders Placed This Encounter  Procedures  . CBC  . Pro b natriuretic peptide (BNP)   Medication changes: No orders of the defined types were placed in this encounter.   Signed, Park Liter, MD, [facility] 01/31/2018 12:22 PM    [place]

## 2018-01-31 NOTE — Patient Instructions (Signed)
Medication Instructions:  Your physician recommends that you continue on your current medications as directed. Please refer to the Current Medication list given to you today.  If you need a refill on your cardiac medications before your next appointment, please call your pharmacy.   Lab work: Your physician recommends that you return for lab work today: CBC, and BNP  If you have labs (blood work) drawn today and your tests are completely normal, you will receive your results only by: Marland Kitchen MyChart Message (if you have MyChart) OR . A paper copy in the mail If you have any lab test that is abnormal or we need to change your treatment, we will call you to review the results.  Testing/Procedures: None.   Follow-Up: At [facility], you and your health needs are our priority.  As part of our continuing mission to provide you with exceptional heart care, we have created designated Provider Care Teams.  These Care Teams include your primary Cardiologist (physician) and Advanced Practice Providers (APPs -  Physician Assistants and Nurse Practitioners) who all work together to provide you with the care you need, when you need it. You will need a follow up appointment in 1 months.  Please call our office 2 months in advance to schedule this appointment.  You may see Jenne Campus, MD or another member of our [place] Provider Team in [place]: Shirlee More, MD . Jyl Heinz, MD  Any Other Special Instructions Will Be Listed Below (If Applicable).

## 2018-01-31 NOTE — Progress Notes (Signed)
Crystal Yates, female    DOB: 16-Mar-1956,     MRN: [id number]   Brief patient profile:  15 yowf active smoker born in [place] and lived in [place] then [place] with heart dz freq stents breathing was good until around 2006  while in [place]  Started seeing Chodri for sob dx as copd/emphysema  then worse 2011 requiring some 02 since 2015  With likely GOLD III copd (with restrictive component) suggested on initial spirometry 01/31/2018   Self referred to pulmonary clinic 01/31/2018 p admit  Date of Admission: 01/04/2018 12:48 AM Date of Discharge: 01/17/2018 Attending Physician: No att. providers fou   Discharge Diagnosis: Principal Problem:   Acute on chronic respiratory failure with hypoxia and hypercapnia ([place]) Active Problems:   Secondary adrenal insufficiency ([place])   Type 2 diabetes mellitus without complication, with long-term current use of insulin (HCC)   Acute on chronic combined systolic and diastolic heart failure ([place])   COPD without exacerbation ([place])   CAD (coronary artery disease)   Iron deficiency   Internal and external bleeding hemorrhoids   Sessile colonic polyp   Diverticulosis of colon without hemorrhage   Echo 10/19/17 Study Conclusions  - Left ventricle: The cavity size was normal. There was mild focal   basal hypertrophy of the septum. Systolic function was normal.   The estimated ejection fraction was in the range of 60% to 65%.   Wall motion was normal; there were no regional wall motion   abnormalities. Doppler parameters are consistent with abnormal   left ventricular relaxation (grade 1 diastolic dysfunction).   Doppler parameters are consistent with indeterminate ventricular   filling pressure. - Aortic valve: Transvalvular velocity was within the normal range.   There was no stenosis. There was no regurgitation. - Mitral valve: Transvalvular velocity was within the normal range.   There was no evidence for stenosis. There was trivial  regurgitation. - Right ventricle: The cavity size was normal. Wall thickness was   normal. Systolic function was normal. - Tricuspid valve: There was trivial regurgitation.    01/31/2018  Pulmonary/ 1st office eval/Crystal Yates  Chief Complaint  Patient presents with  . Pulmonary Consult    Self referral. Pt states that she has COPD. She is on o2. She c/o SOB for the past 4 yrs, worse over the past 2 wks.   Dyspnea:  Can't do any walking at [employer] x 4 months - just room to room at home Cough: doe assoc with more cough than usual worse in am x 1 -2 tsp variably green x one year  Sleep: in recliner 30 degrees  SABA use: saba/ prednisone not much    No  obvious day to day or daytime variability or assoc excess/ purulent sputum or mucus plugs or hemoptysis or cp or chest tightness, subjective wheeze or overt sinus or hb symptoms.     Also denies any obvious fluctuation of symptoms with weather or environmental changes or other aggravating or alleviating factors except as outlined above   No unusual exposure hx or h/o childhood pna/ asthma or knowledge of premature birth.  Current Allergies, Complete Past Medical History, Past Surgical History, Family History, and Social History were reviewed in [employer] record.  ROS  The following are not active complaints unless bolded Hoarseness, sore throat, dysphagia, dental problems, itching, sneezing,  nasal congestion or discharge of excess mucus or purulent secretions, ear ache,   fever, chills, sweats, unintended wt loss or wt gain, classically pleuritic or  exertional cp,  orthopnea pnd or arm/hand swelling  or leg swelling, presyncope, palpitations, abdominal pain, anorexia, nausea, vomiting, diarrhea  or change in bowel habits or change in bladder habits, change in stools or change in urine, dysuria, hematuria,  rash, arthralgias, visual complaints, headache, numbness, weakness or ataxia or problems with walking or coordination,   change in mood or  memory.              Past Medical History:  Diagnosis Date  . Acute on chronic systolic heart failure exacerbation(HCC) 04/08/2016  . Arthritis   . CAD in native artery    a. Prior LAD stenting based on cath. b. RCA stenting 03/2016 x2.  . Chronic combined systolic and diastolic CHF (congestive heart failure) ([place])   . CKD (chronic kidney disease), stage II   . COPD (chronic obstructive pulmonary disease) ([place])   . Diabetes mellitus without complication ([place])   . Dyspnea   . Hashimoto's thyroiditis   . Hyperlipidemia   . Hypertension   . Myocardial infarction ([place])   . On home oxygen therapy    "2L; 24/7" (10/23/2017)  . Secondary adrenal insufficiency ([place])   . Thyroid disease   . Tobacco abuse     Outpatient Medications Prior to Visit  Medication Sig Dispense Refill  . acetaminophen (TYLENOL) 325 MG tablet Take 2 tablets (650 mg total) by mouth every 4 (four) hours as needed for headache or mild pain.    Marland Kitchen albuterol (PROAIR HFA) 108 (90 Base) MCG/ACT inhaler Inhale 2 puffs into the lungs See admin instructions. Inhale 2 puffs into the lungs every 4-6 hours as needed for shortness of breath or wheezing    . atorvastatin (LIPITOR) 80 MG tablet Take 1 tablet (80 mg total) by mouth daily at 6 PM. 30 tablet 6  . CALCIUM PO Take 1 tablet by mouth daily.    . Cholecalciferol (VITAMIN D3) 2000 units capsule Take 2,000 Units by mouth daily.     . clopidogrel (PLAVIX) 75 MG tablet Take 1 tablet (75 mg total) by mouth daily. 30 tablet 0  . DULoxetine (CYMBALTA) 60 MG capsule Take 60 mg by mouth daily.   0  . hydrocortisone (ANUSOL-HC) 2.5 % rectal cream Place rectally 2 (two) times daily. 30 g 0  . hydrocortisone (CORTEF) 5 MG tablet Take 10 mg by mouth 2 (two) times daily.    . insulin aspart (NOVOLOG) 100 UNIT/ML injection Inject into the skin 3 (three) times daily before meals.    . Insulin Aspart, w/Niacinamide, (FIASP FLEXTOUCH) 100 UNIT/ML SOPN Inject 2-14 Units  into the skin See admin instructions. Use three times day as needed before meals per sliding scale    . insulin degludec (TRESIBA FLEXTOUCH) 100 UNIT/ML SOPN FlexTouch Pen Inject 40 Units into the skin at bedtime.     Marland Kitchen ipratropium-albuterol (DUONEB) 0.5-2.5 (3) MG/3ML SOLN Take 3 mLs by nebulization 4 (four) times daily.    . Iron-FA-B Cmp-C-Biot-Probiotic (FUSION PLUS) CAPS Take 1 capsule by mouth daily.  5  . levothyroxine (SYNTHROID, LEVOTHROID) 150 MCG tablet Take 150 mcg by mouth daily before breakfast.    . Magnesium 400 MG TABS Take 400 mg by mouth daily.     . nitroGLYCERIN (NITROSTAT) 0.4 MG SL tablet Place 0.4 mg under the tongue every 5 (five) minutes as needed for chest pain.     . OXYGEN 2lpm 24/7  DME- [facility]    . polyethylene glycol (MIRALAX / GLYCOLAX) packet Take 17 g by mouth  daily. 14 each 0  . potassium chloride SA (K-DUR,KLOR-CON) 20 MEQ tablet Take 1 tablet (20 mEq total) by mouth daily. Take when you take Torsemide. 90 tablet 3  . pramipexole (MIRAPEX) 1 MG tablet Take 1 mg by mouth 2 (two) times daily.    . promethazine (PHENERGAN) 12.5 MG tablet Take 12.5 mg by mouth every 6 (six) hours as needed for nausea.  0  . Semaglutide,0.25 or 0.5MG /DOS, (OZEMPIC, 0.25 OR 0.5 MG/DOSE,) 2 MG/1.5ML SOPN Inject into the skin as directed.    . torsemide (DEMADEX) 20 MG tablet Take 40 mg by mouth 2 (two) times daily.    . TRELEGY ELLIPTA 100-62.5-25 MCG/INH AEPB Inhale 1 puff into the lungs daily.  3  . TRULICITY 1.5 QZ/0.0PQ SOPN Inject 1.5 mg into the skin every Sunday.   4  . aspirin 81 MG chewable tablet Chew 1 tablet (81 mg total) by mouth daily. 60 tablet 2  . EMGALITY 120 MG/ML SOAJ Inject 120 mg into the skin every 30 (thirty) days.   3  . metoprolol succinate (TOPROL-XL) 25 MG 24 hr tablet Take 0.5 tablets (12.5 mg total) by mouth daily. 30 tablet 2  . oxybutynin (DITROPAN XL) 15 MG 24 hr tablet Take 15 mg by mouth at bedtime.    . torsemide (DEMADEX) 20 MG tablet Take 1 tablet  (20 mg total) by mouth daily as needed. Dry weight 216 lb. Can take extra dose if you gain 3 lb in a day or 5 lb in 1 week 60 tablet 1   No facility-administered medications prior to visit.      Objective:     BP 112/70 (BP Location: Left Arm, Cuff Size: Normal)   Pulse (!) 107   Ht 5\' 2"  (1.575 m)   Wt 212 lb (96.2 kg)   SpO2 93%   BMI 38.78 kg/m   SpO2: 93 % O2 Type: Pulse O2 O2 Flow Rate (L/min): 2 L/min  POC    Hoarse amb wf nad at rest    HEENT: nl dentition / oropharynx. Nl external ear canals without cough reflex -  Mild bilateral non-specific turbinate edema     NECK :  without JVD/Nodes/TM/ nl carotid upstrokes bilaterally   LUNGS: no acc muscle use,  Mild barrel  contour chest wall with bilateral  Distant bs s audible wheeze and  without cough on insp or exp maneuver and mild  Hyperresonant  to  percussion bilaterally     CV:  RRR  no s3 or murmur or increase in P2, and 1+ pitting both LEs  ABD:  Quite obese   nontender with poor insp excursin/ pos late insp Hoover's  in the supine position. No bruits or organomegaly appreciated, bowel sounds nl  MS:   Nl gait/  ext warm without deformities, calf tenderness, cyanosis or clubbing No obvious joint restrictions   SKIN: warm and dry without lesions    NEURO:  alert, approp, nl sensorium with  no motor or cerebellar deficits apparent.         CXR PA and Lateral:   01/31/2018 :    I personally reviewed images and   impression as follows:  Copd/cm with non-specific increased markings improved vs prior studies         Assessment   COPD  GOLD III/ c/b hypoxemia hypercarbia  Spirometry 01/31/2018  FEV1 0.9 (39%)  Ratio 63 p am  trelegy - 01/31/2018  After extensive coaching inhaler device,  effectiveness =  75% (short ti) on smi > try stiolto x 2 week sample    DDX of  difficult airways management almost all start with A and  include Adherence, Ace Inhibitors, Acid Reflux, Active Sinus Disease, Alpha 1  Antitripsin deficiency, Anxiety masquerading as Airways dz,  ABPA,  Allergy(esp in young), Aspiration (esp in elderly), Adverse effects of meds,  Active smoking or vaping, A bunch of PE's (a small clot burden can't cause this syndrome unless there is already severe underlying pulm or vascular dz with poor reserve) plus two Bs  = Bronchiectasis and Beta blocker use..and one C= CHF   Adherence is always the initial "prime suspect" and is a multilayered concern that requires a "trust but verify" approach in every patient - starting with knowing how to use medications, especially inhalers, correctly, keeping up with refills and understanding the fundamental difference between maintenance and prns vs those medications only taken for a very short course and then stopped and not refilled.  - see smi teaching above - return  with all meds in hand using a trust but verify approach to confirm accurate Medication  Reconciliation The principal here is that until we are certain that the  patients are doing what we've asked, it makes no sense to ask them to do more.   Active smoking at the top of the usual list of suspects  ? Adverse effects of dpi with pseudowheeze/ hoarseness on exam > try off trelegy   ? Anxiety/depression/ deconditioning  > usually at the bottom of this list of usual suspects but should be much higher on this pt's based on H and P and note already on psychotropics and may interfere with adherence and also interpretation of response or lack thereof to symptom management which can be quite subjective.  ? Beta blocker effects > unlikely on relatively low doses of toprol   ? chf > still has peripheral edema but cxr edema much improved > diuresis as tol per Dr Raliegh Ip who is monitoring proBNP and cbc > done  01/31/2018 but pendng    >>> return in 4 weeks with full pfts      Chronic respiratory failure with hypoxia and hypercapnia ([place]) First placed on 02 2015 C03  01/17/18 =  33    Adequate  control on present rx, reviewed in detail with pt > no change in rx needed    >>>>Goal is to keep sats > 90% at all times but no reason to drive it into upper 83M in this setting   Cigarette smoker 4-5 min discussion re active cigarette smoking in addition to office E&M  Ask about tobacco use:   ongoing Advise quitting   I emphasized that although we never turn away smokers from the pulmonary clinic, we do ask that they understand that the recommendations that we make  won't work nearly as well in the presence of continued cigarette exposure. In fact, we may very well  reach a point where we can't promise to help the patient if he/she can't quit smoking. (We can and will promise to try to help, we just can't promise what we recommend will really work)  Assess willingness:  Not committed at this point Assist in quit attempt:  Per PCP when ready Arrange follow up:   Follow up per Primary Care planned        Total time devoted to counseling  > 50 % of initial 60 min office visit:  review case with pt/ discussion of options/alternatives/ personally  creating written customized instructions  in presence of pt  then going over those specific  Instructions directly with the pt including how to use all of the meds but in particular covering each new medication in detail and the difference between the maintenance= "automatic" meds and the prns using an action plan format for the latter (If this problem/symptom => do that organization reading Left to right).  Please see AVS from this visit for a full list of these instructions which I personally wrote for this pt and  are unique to this visit.   See device teaching which extended face to face time for this visit       Christinia Gully, MD 01/31/2018

## 2018-01-31 NOTE — Addendum Note (Signed)
Addended by: Jerl Santos R on: 01/31/2018 04:01 PM   Modules accepted: Orders

## 2018-01-31 NOTE — Patient Instructions (Addendum)
Plan A = Automatic =Stop trelegy and start stiolto 2 pffs each am    Work on inhaler technique:  relax and gently blow all the way out then take a nice smooth deep breath back in, triggering the inhaler at same time you start breathing in.  Hold for up to 5 seconds if you can.   Rinse and gargle with water when done      Plan B = Backup Only use your albuterol as a rescue medication to be used if you can't catch your breath by resting or doing a relaxed purse lip breathing pattern.  - The less you use it, the better it will work when you need it. - Ok to use the inhaler up to 2 puffs  every 4 hours if you must but call for appointment if use goes up over your usual need - Don't leave home without it !!  (think of it like the spare tire for your car)   Plan C = Crisis - only use your albuterol nebulizer if you first try Plan B and it fails to help > ok to use the nebulizer up to every 4 hours but if start needing it regularly call for immediate appointment   The key is to stop smoking completely before smoking completely stops you!     Please remember to go to the  x-ray department downstairs in the basement  for your tests - we will call you with the results when they are available.       Please schedule a follow up office visit in 4 weeks, sooner if needed  with all medications /inhalers/ solutions in hand so we can verify exactly what you are taking. This includes all medications from all doctors and over the counters

## 2018-02-01 ENCOUNTER — Encounter: Payer: Self-pay | Admitting: Internal Medicine

## 2018-02-01 DIAGNOSIS — F17210 Nicotine dependence, cigarettes, uncomplicated: Secondary | ICD-10-CM | POA: Insufficient documentation

## 2018-02-01 DIAGNOSIS — I2510 Atherosclerotic heart disease of native coronary artery without angina pectoris: Secondary | ICD-10-CM | POA: Diagnosis not present

## 2018-02-01 DIAGNOSIS — F339 Major depressive disorder, recurrent, unspecified: Secondary | ICD-10-CM | POA: Diagnosis not present

## 2018-02-01 DIAGNOSIS — R1900 Intra-abdominal and pelvic swelling, mass and lump, unspecified site: Secondary | ICD-10-CM | POA: Diagnosis not present

## 2018-02-01 DIAGNOSIS — I255 Ischemic cardiomyopathy: Secondary | ICD-10-CM | POA: Diagnosis not present

## 2018-02-01 DIAGNOSIS — I472 Ventricular tachycardia: Secondary | ICD-10-CM | POA: Diagnosis not present

## 2018-02-01 DIAGNOSIS — J449 Chronic obstructive pulmonary disease, unspecified: Secondary | ICD-10-CM | POA: Diagnosis not present

## 2018-02-01 DIAGNOSIS — I5023 Acute on chronic systolic (congestive) heart failure: Secondary | ICD-10-CM | POA: Diagnosis not present

## 2018-02-01 DIAGNOSIS — J9612 Chronic respiratory failure with hypercapnia: Secondary | ICD-10-CM

## 2018-02-01 DIAGNOSIS — I10 Essential (primary) hypertension: Secondary | ICD-10-CM | POA: Diagnosis not present

## 2018-02-01 DIAGNOSIS — G43019 Migraine without aura, intractable, without status migrainosus: Secondary | ICD-10-CM | POA: Diagnosis not present

## 2018-02-01 DIAGNOSIS — G4733 Obstructive sleep apnea (adult) (pediatric): Secondary | ICD-10-CM | POA: Diagnosis not present

## 2018-02-01 DIAGNOSIS — J9601 Acute respiratory failure with hypoxia: Secondary | ICD-10-CM | POA: Diagnosis not present

## 2018-02-01 DIAGNOSIS — K922 Gastrointestinal hemorrhage, unspecified: Secondary | ICD-10-CM | POA: Diagnosis not present

## 2018-02-01 DIAGNOSIS — S301XXD Contusion of abdominal wall, subsequent encounter: Secondary | ICD-10-CM | POA: Diagnosis not present

## 2018-02-01 DIAGNOSIS — J9611 Chronic respiratory failure with hypoxia: Secondary | ICD-10-CM | POA: Insufficient documentation

## 2018-02-01 DIAGNOSIS — M3500 Sicca syndrome, unspecified: Secondary | ICD-10-CM | POA: Diagnosis not present

## 2018-02-01 DIAGNOSIS — Z48815 Encounter for surgical aftercare following surgery on the digestive system: Secondary | ICD-10-CM | POA: Diagnosis not present

## 2018-02-01 DIAGNOSIS — I200 Unstable angina: Secondary | ICD-10-CM | POA: Diagnosis not present

## 2018-02-01 LAB — CBC
Hematocrit: 35.4 % (ref 34.0–46.6)
Hemoglobin: 11.1 g/dL (ref 11.1–15.9)
MCH: 27.7 pg (ref 26.6–33.0)
MCHC: 31.4 g/dL — ABNORMAL LOW (ref 31.5–35.7)
MCV: 88 fL (ref 79–97)
Platelets: 295 10*3/uL (ref 150–450)
RBC: 4.01 x10E6/uL (ref 3.77–5.28)
RDW: 17 % — ABNORMAL HIGH (ref 12.3–15.4)
WBC: 9.5 10*3/uL (ref 3.4–10.8)

## 2018-02-01 LAB — PRO B NATRIURETIC PEPTIDE: NT-PRO BNP: 1553 pg/mL — AB (ref 0–287)

## 2018-02-01 NOTE — Assessment & Plan Note (Addendum)
Spirometry 01/31/2018  FEV1 0.9 (39%)  Ratio 63 p am  trelegy - 01/31/2018  After extensive coaching inhaler device,  effectiveness =    75% (short ti) on smi > try stiolto x 2 week sample    DDX of  difficult airways management almost all start with A and  include Adherence, Ace Inhibitors, Acid Reflux, Active Sinus Disease, Alpha 1 Antitripsin deficiency, Anxiety masquerading as Airways dz,  ABPA,  Allergy(esp in young), Aspiration (esp in elderly), Adverse effects of meds,  Active smoking or vaping, A bunch of PE's (a small clot burden can't cause this syndrome unless there is already severe underlying pulm or vascular dz with poor reserve) plus two Bs  = Bronchiectasis and Beta blocker use..and one C= CHF   Adherence is always the initial "prime suspect" and is a multilayered concern that requires a "trust but verify" approach in every patient - starting with knowing how to use medications, especially inhalers, correctly, keeping up with refills and understanding the fundamental difference between maintenance and prns vs those medications only taken for a very short course and then stopped and not refilled.  - see smi teaching above - return  with all meds in hand using a trust but verify approach to confirm accurate Medication  Reconciliation The principal here is that until we are certain that the  patients are doing what we've asked, it makes no sense to ask them to do more.   Active smoking at the top of the usual list of suspects  ? Adverse effects of dpi with pseudowheeze/ hoarseness on exam > try off trelegy   ? Anxiety/depression/ deconditioning  > usually at the bottom of this list of usual suspects but should be much higher on this pt's based on H and P and note already on psychotropics and may interfere with adherence and also interpretation of response or lack thereof to symptom management which can be quite subjective.  ? Beta blocker effects > unlikely on relatively low doses of  toprol   ? chf > still has peripheral edema but cxr edema much improved > diuresis as tol per Dr Raliegh Ip who is monitoring proBNP and cbc > done  01/31/2018 but pendng    >>> return in 4 weeks with full pfts

## 2018-02-01 NOTE — Assessment & Plan Note (Signed)
First placed on 02 2015 C03  01/17/18 =  33    Adequate control on present rx, reviewed in detail with pt > no change in rx needed    >>>>Goal is to keep sats > 90% at all times but no reason to drive it into upper 89V in this setting

## 2018-02-01 NOTE — Assessment & Plan Note (Signed)
4-5 min discussion re active cigarette smoking in addition to office E&M  Ask about tobacco use:   ongoing Advise quitting   I emphasized that although we never turn away smokers from the pulmonary clinic, we do ask that they understand that the recommendations that we make  won't work nearly as well in the presence of continued cigarette exposure. In fact, we may very well  reach a point where we can't promise to help the patient if he/she can't quit smoking. (We can and will promise to try to help, we just can't promise what we recommend will really work)  Assess willingness:  Not committed at this point Assist in quit attempt:  Per PCP when ready Arrange follow up:   Follow up per Primary Care planned        Total time devoted to counseling  > 50 % of initial 60 min office visit:  review case with pt/ discussion of options/alternatives/ personally creating written customized instructions  in presence of pt  then going over those specific  Instructions directly with the pt including how to use all of the meds but in particular covering each new medication in detail and the difference between the maintenance= "automatic" meds and the prns using an action plan format for the latter (If this problem/symptom => do that organization reading Left to right).  Please see AVS from this visit for a full list of these instructions which I personally wrote for this pt and  are unique to this visit.   See device teaching which extended face to face time for this visit

## 2018-02-02 ENCOUNTER — Telehealth: Payer: Self-pay | Admitting: Internal Medicine

## 2018-02-02 ENCOUNTER — Other Ambulatory Visit: Payer: Self-pay | Admitting: Cardiology

## 2018-02-02 ENCOUNTER — Telehealth: Payer: Self-pay | Admitting: Cardiology

## 2018-02-02 DIAGNOSIS — S301XXD Contusion of abdominal wall, subsequent encounter: Secondary | ICD-10-CM | POA: Diagnosis not present

## 2018-02-02 DIAGNOSIS — J449 Chronic obstructive pulmonary disease, unspecified: Secondary | ICD-10-CM | POA: Diagnosis not present

## 2018-02-02 DIAGNOSIS — I255 Ischemic cardiomyopathy: Secondary | ICD-10-CM | POA: Diagnosis not present

## 2018-02-02 DIAGNOSIS — J9601 Acute respiratory failure with hypoxia: Secondary | ICD-10-CM | POA: Diagnosis not present

## 2018-02-02 DIAGNOSIS — I2510 Atherosclerotic heart disease of native coronary artery without angina pectoris: Secondary | ICD-10-CM | POA: Diagnosis not present

## 2018-02-02 DIAGNOSIS — I10 Essential (primary) hypertension: Secondary | ICD-10-CM | POA: Diagnosis not present

## 2018-02-02 DIAGNOSIS — I472 Ventricular tachycardia: Secondary | ICD-10-CM | POA: Diagnosis not present

## 2018-02-02 DIAGNOSIS — I200 Unstable angina: Secondary | ICD-10-CM | POA: Diagnosis not present

## 2018-02-02 DIAGNOSIS — I5042 Chronic combined systolic (congestive) and diastolic (congestive) heart failure: Secondary | ICD-10-CM

## 2018-02-02 DIAGNOSIS — M3500 Sicca syndrome, unspecified: Secondary | ICD-10-CM | POA: Diagnosis not present

## 2018-02-02 DIAGNOSIS — Z48815 Encounter for surgical aftercare following surgery on the digestive system: Secondary | ICD-10-CM | POA: Diagnosis not present

## 2018-02-02 DIAGNOSIS — I5023 Acute on chronic systolic (congestive) heart failure: Secondary | ICD-10-CM | POA: Diagnosis not present

## 2018-02-02 DIAGNOSIS — F339 Major depressive disorder, recurrent, unspecified: Secondary | ICD-10-CM | POA: Diagnosis not present

## 2018-02-02 DIAGNOSIS — R1900 Intra-abdominal and pelvic swelling, mass and lump, unspecified site: Secondary | ICD-10-CM | POA: Diagnosis not present

## 2018-02-02 DIAGNOSIS — K922 Gastrointestinal hemorrhage, unspecified: Secondary | ICD-10-CM | POA: Diagnosis not present

## 2018-02-02 DIAGNOSIS — G4733 Obstructive sleep apnea (adult) (pediatric): Secondary | ICD-10-CM | POA: Diagnosis not present

## 2018-02-02 DIAGNOSIS — G43019 Migraine without aura, intractable, without status migrainosus: Secondary | ICD-10-CM | POA: Diagnosis not present

## 2018-02-02 MED ORDER — TORSEMIDE 20 MG PO TABS: ORAL_TABLET | ORAL | 1 refills | Status: DC

## 2018-02-02 NOTE — Telephone Encounter (Signed)
Pt will drive to [place] to be with her son who is having surgery. Pt would like to know if MW is okay with her traveling and what recs he would give. Thanks.

## 2018-02-02 NOTE — Telephone Encounter (Signed)
Patient informed of lab results. Informed to take torsemide 60 mg in the am and 40 mg in the pm daily. She will also have labs repeated on Monday 02/05/18. Also informed patient that it is okay with Dr. Agustin Cree for patient to go to [place]. She verbally understands.

## 2018-02-02 NOTE — Telephone Encounter (Signed)
Called pt and advised message from the provider. Pt understood and verbalized understanding. Nothing further is needed.    

## 2018-02-02 NOTE — Telephone Encounter (Signed)
Patient called wanting to know if Dr Raliegh Ip thinks she will be ok to drive out to [place] for a few weeks.  Her son is having his leg amputated and she ants to be there.  Please call her back on her cell [phone]

## 2018-02-02 NOTE — Telephone Encounter (Signed)
Fine from my perspective as long as not smoking on the trip!

## 2018-02-05 ENCOUNTER — Telehealth: Payer: Self-pay | Admitting: Cardiology

## 2018-02-05 DIAGNOSIS — I255 Ischemic cardiomyopathy: Secondary | ICD-10-CM | POA: Diagnosis not present

## 2018-02-05 DIAGNOSIS — R1900 Intra-abdominal and pelvic swelling, mass and lump, unspecified site: Secondary | ICD-10-CM | POA: Diagnosis not present

## 2018-02-05 DIAGNOSIS — J449 Chronic obstructive pulmonary disease, unspecified: Secondary | ICD-10-CM | POA: Diagnosis not present

## 2018-02-05 DIAGNOSIS — I5023 Acute on chronic systolic (congestive) heart failure: Secondary | ICD-10-CM | POA: Diagnosis not present

## 2018-02-05 DIAGNOSIS — K922 Gastrointestinal hemorrhage, unspecified: Secondary | ICD-10-CM | POA: Diagnosis not present

## 2018-02-05 DIAGNOSIS — J9601 Acute respiratory failure with hypoxia: Secondary | ICD-10-CM | POA: Diagnosis not present

## 2018-02-05 DIAGNOSIS — S301XXD Contusion of abdominal wall, subsequent encounter: Secondary | ICD-10-CM | POA: Diagnosis not present

## 2018-02-05 DIAGNOSIS — G4733 Obstructive sleep apnea (adult) (pediatric): Secondary | ICD-10-CM | POA: Diagnosis not present

## 2018-02-05 DIAGNOSIS — G43019 Migraine without aura, intractable, without status migrainosus: Secondary | ICD-10-CM | POA: Diagnosis not present

## 2018-02-05 DIAGNOSIS — I200 Unstable angina: Secondary | ICD-10-CM | POA: Diagnosis not present

## 2018-02-05 DIAGNOSIS — I2510 Atherosclerotic heart disease of native coronary artery without angina pectoris: Secondary | ICD-10-CM | POA: Diagnosis not present

## 2018-02-05 DIAGNOSIS — Z48815 Encounter for surgical aftercare following surgery on the digestive system: Secondary | ICD-10-CM | POA: Diagnosis not present

## 2018-02-05 DIAGNOSIS — I5042 Chronic combined systolic (congestive) and diastolic (congestive) heart failure: Secondary | ICD-10-CM | POA: Diagnosis not present

## 2018-02-05 DIAGNOSIS — M3500 Sicca syndrome, unspecified: Secondary | ICD-10-CM | POA: Diagnosis not present

## 2018-02-05 DIAGNOSIS — I472 Ventricular tachycardia: Secondary | ICD-10-CM | POA: Diagnosis not present

## 2018-02-05 DIAGNOSIS — I10 Essential (primary) hypertension: Secondary | ICD-10-CM | POA: Diagnosis not present

## 2018-02-05 DIAGNOSIS — F339 Major depressive disorder, recurrent, unspecified: Secondary | ICD-10-CM | POA: Diagnosis not present

## 2018-02-05 LAB — BASIC METABOLIC PANEL
BUN/Creatinine Ratio: 17 (ref 12–28)
BUN: 22 mg/dL (ref 8–27)
CALCIUM: 9.4 mg/dL (ref 8.7–10.3)
CO2: 29 mmol/L (ref 20–29)
Chloride: 96 mmol/L (ref 96–106)
Creatinine, Ser: 1.28 mg/dL — ABNORMAL HIGH (ref 0.57–1.00)
GFR calc Af Amer: 52 mL/min/{1.73_m2} — ABNORMAL LOW (ref >59)
GFR, EST NON AFRICAN AMERICAN: 45 mL/min/{1.73_m2} — AB (ref >59)
GLUCOSE: 169 mg/dL — AB (ref 65–99)
POTASSIUM: 4.1 mmol/L (ref 3.5–5.2)
Sodium: 143 mmol/L (ref 134–144)

## 2018-02-05 NOTE — Telephone Encounter (Signed)
Patient reports lower extremity swelling while in office today. Dr. Agustin Cree aware. Per Dr. Agustin Cree will wait for lab work and advise once it results. Patient verbally understands and she will call the office back if things change or get worse.

## 2018-02-05 NOTE — Telephone Encounter (Signed)
Patient came in for labs this am and states that she is swelling in lower extremeties but not gaining any weight, please call and advise.

## 2018-02-06 NOTE — Progress Notes (Signed)
Spoke with pt and notified of results per Dr. Wert. Pt verbalized understanding and denied any questions. 

## 2018-02-07 DIAGNOSIS — G4733 Obstructive sleep apnea (adult) (pediatric): Secondary | ICD-10-CM | POA: Diagnosis not present

## 2018-02-07 DIAGNOSIS — M3500 Sicca syndrome, unspecified: Secondary | ICD-10-CM | POA: Diagnosis not present

## 2018-02-07 DIAGNOSIS — K922 Gastrointestinal hemorrhage, unspecified: Secondary | ICD-10-CM | POA: Diagnosis not present

## 2018-02-07 DIAGNOSIS — G43019 Migraine without aura, intractable, without status migrainosus: Secondary | ICD-10-CM | POA: Diagnosis not present

## 2018-02-07 DIAGNOSIS — S301XXD Contusion of abdominal wall, subsequent encounter: Secondary | ICD-10-CM | POA: Diagnosis not present

## 2018-02-07 DIAGNOSIS — I472 Ventricular tachycardia: Secondary | ICD-10-CM | POA: Diagnosis not present

## 2018-02-07 DIAGNOSIS — I10 Essential (primary) hypertension: Secondary | ICD-10-CM | POA: Diagnosis not present

## 2018-02-07 DIAGNOSIS — J9601 Acute respiratory failure with hypoxia: Secondary | ICD-10-CM | POA: Diagnosis not present

## 2018-02-07 DIAGNOSIS — I200 Unstable angina: Secondary | ICD-10-CM | POA: Diagnosis not present

## 2018-02-07 DIAGNOSIS — F339 Major depressive disorder, recurrent, unspecified: Secondary | ICD-10-CM | POA: Diagnosis not present

## 2018-02-07 DIAGNOSIS — R1900 Intra-abdominal and pelvic swelling, mass and lump, unspecified site: Secondary | ICD-10-CM | POA: Diagnosis not present

## 2018-02-07 DIAGNOSIS — I255 Ischemic cardiomyopathy: Secondary | ICD-10-CM | POA: Diagnosis not present

## 2018-02-07 DIAGNOSIS — J449 Chronic obstructive pulmonary disease, unspecified: Secondary | ICD-10-CM | POA: Diagnosis not present

## 2018-02-07 DIAGNOSIS — I2510 Atherosclerotic heart disease of native coronary artery without angina pectoris: Secondary | ICD-10-CM | POA: Diagnosis not present

## 2018-02-07 DIAGNOSIS — I5023 Acute on chronic systolic (congestive) heart failure: Secondary | ICD-10-CM | POA: Diagnosis not present

## 2018-02-07 DIAGNOSIS — Z48815 Encounter for surgical aftercare following surgery on the digestive system: Secondary | ICD-10-CM | POA: Diagnosis not present

## 2018-02-13 DIAGNOSIS — F5105 Insomnia due to other mental disorder: Secondary | ICD-10-CM | POA: Diagnosis not present

## 2018-02-13 DIAGNOSIS — J9611 Chronic respiratory failure with hypoxia: Secondary | ICD-10-CM | POA: Diagnosis not present

## 2018-02-13 DIAGNOSIS — E118 Type 2 diabetes mellitus with unspecified complications: Secondary | ICD-10-CM | POA: Diagnosis not present

## 2018-02-13 DIAGNOSIS — F331 Major depressive disorder, recurrent, moderate: Secondary | ICD-10-CM | POA: Diagnosis not present

## 2018-02-20 DIAGNOSIS — R2689 Other abnormalities of gait and mobility: Secondary | ICD-10-CM | POA: Diagnosis not present

## 2018-02-20 DIAGNOSIS — M6281 Muscle weakness (generalized): Secondary | ICD-10-CM | POA: Diagnosis not present

## 2018-02-24 DIAGNOSIS — J449 Chronic obstructive pulmonary disease, unspecified: Secondary | ICD-10-CM | POA: Diagnosis not present

## 2018-02-28 ENCOUNTER — Ambulatory Visit (INDEPENDENT_AMBULATORY_CARE_PROVIDER_SITE_OTHER): Payer: Federal, State, Local not specified - PPO | Admitting: Internal Medicine

## 2018-02-28 ENCOUNTER — Encounter: Payer: Self-pay | Admitting: Internal Medicine

## 2018-02-28 VITALS — BP 122/74 | HR 54 | Ht 62.0 in | Wt 215.0 lb

## 2018-02-28 DIAGNOSIS — J9612 Chronic respiratory failure with hypercapnia: Secondary | ICD-10-CM

## 2018-02-28 DIAGNOSIS — J9611 Chronic respiratory failure with hypoxia: Secondary | ICD-10-CM

## 2018-02-28 DIAGNOSIS — J449 Chronic obstructive pulmonary disease, unspecified: Secondary | ICD-10-CM

## 2018-02-28 DIAGNOSIS — F17210 Nicotine dependence, cigarettes, uncomplicated: Secondary | ICD-10-CM

## 2018-02-28 MED ORDER — ALBUTEROL SULFATE (2.5 MG/3ML) 0.083% IN NEBU: 2.5000 mg | INHALATION_SOLUTION | Freq: Four times a day (QID) | RESPIRATORY_TRACT | 12 refills | Status: AC | PRN

## 2018-02-28 NOTE — Progress Notes (Signed)
Crystal Yates, female    DOB: Feb 26, 1956,     MRN: [id number]    Brief patient profile:  71 yowf active smoker born in [place] and lived in [place] then [place] with heart dz freq stents breathing was good until around 2006  while in [place]  Started seeing Crystal Yates for sob dx as copd/emphysema  then worse 2011 requiring some 02 since 2015  With likely GOLD III copd (with restrictive component) suggested on initial spirometry 01/31/2018   Self referred to pulmonary clinic 01/31/2018 p admit  Date of Admission: 01/04/2018 12:48 AM Date of Discharge: 01/17/2018 Attending Physician: No att. providers fou   Discharge Diagnosis: Principal Problem:   Acute on chronic respiratory failure with hypoxia and hypercapnia ([place]) Active Problems:   Secondary adrenal insufficiency ([place])   Type 2 diabetes mellitus without complication, with long-term current use of insulin (HCC)   Acute on chronic combined systolic and diastolic heart failure ([place])   COPD without exacerbation ([place])   CAD (coronary artery disease)   Iron deficiency   Internal and external bleeding hemorrhoids   Sessile colonic polyp   Diverticulosis of colon without hemorrhage   Echo 10/19/17 Study Conclusions  - Left ventricle: The cavity size was normal. There was mild focal   basal hypertrophy of the septum. Systolic function was normal.   The estimated ejection fraction was in the range of 60% to 65%.   Wall motion was normal; there were no regional wall motion   abnormalities. Doppler parameters are consistent with abnormal   left ventricular relaxation (grade 1 diastolic dysfunction).   Doppler parameters are consistent with indeterminate ventricular   filling pressure. - Aortic valve: Transvalvular velocity was within the normal range.   There was no stenosis. There was no regurgitation. - Mitral valve: Transvalvular velocity was within the normal range.   There was no evidence for stenosis. There was trivial  regurgitation. - Right ventricle: The cavity size was normal. Wall thickness was   normal. Systolic function was normal. - Tricuspid valve: There was trivial regurgitation.    01/31/2018  Pulmonary/ 1st office eval/Crystal Yates  Chief Complaint  Patient presents with  . Pulmonary Consult    Self referral. Pt states that she has COPD. She is on o2. She c/o SOB for the past 4 yrs, worse over the past 2 wks.   Dyspnea:  Can't do any walking at [employer] x 4 months - just room to room at home Cough: doe assoc with more cough than usual worse in am x 1 -2 tsp variably green x one year  Sleep: in recliner 30 degrees  SABA use: saba/ prednisone not much  rec Plan A = Automatic =Stop trelegy and start stiolto 2 pffs each am  Work on inhaler technique:  relax and gently blow all the way out then take a nice smooth deep breath back in, triggering the inhaler at same time you start breathing in.  Hold for up to 5 seconds if you can.   Rinse and gargle with water when done Plan B = Backup Only use your albuterol as a rescue medication  Plan C = Crisis - only use your albuterol nebulizer if you first try Plan B and it fails to help > ok to use the nebulizer up to every 4 hours but if start needing it regularly call for immediate appointment The key is to stop smoking completely before smoking completely stops you!   Please remember to go to the  x-ray department  downstairs in the basement  for your tests - we will call you with the results when they are available.   02/28/2018  f/u ov/Crystal Yates re:  Copd gold III maint on stiolto Chief Complaint  Patient presents with  . Follow-up    Breathing is not doing well today. She states she has good and bad days. She states she rarely uses her albuterol inhaler but she uses her neb 3 x per wk on average. She is still smoking,   Dyspnea:  Able to do 100 ft then sob = MMRC3 = can't walk 100 yards even at a slow pace at a flat grade s stopping due to sob  MMRC3 = can't walk  100 yards even at a slow pace at a flat grade s stopping due to sob   Cough: was better, some worse day of ov but only min mucoid sputum  Sleeping: in recliner x 30 degrees x years  SABA use: rarely 02: 2lpm 24/7    No obvious day to day or daytime variability or assoc excess/ purulent sputum or mucus plugs or hemoptysis or cp or chest tightness, subjective wheeze or overt sinus or hb symptoms.   Sleeping  without nocturnal  or early am exacerbation  of respiratory  c/o's or need for noct saba. Also denies any obvious fluctuation of symptoms with weather or environmental changes or other aggravating or alleviating factors except as outlined above   No unusual exposure hx or h/o childhood pna/ asthma or knowledge of premature birth.  Current Allergies, Complete Past Medical History, Past Surgical History, Family History, and Social History were reviewed in [employer] record.  ROS  The following are not active complaints unless bolded Hoarseness, sore throat, dysphagia, dental problems, itching, sneezing,  nasal congestion or discharge of excess mucus or purulent secretions, ear ache,   fever, chills, sweats, unintended wt loss or wt gain, classically pleuritic or exertional cp,  orthopnea pnd or arm/hand swelling  or leg swelling, presyncope, palpitations, abdominal pain, anorexia, nausea, vomiting, diarrhea  or change in bowel habits or change in bladder habits, change in stools or change in urine, dysuria, hematuria,  rash, arthralgias, visual complaints, headache, numbness, weakness or ataxia or problems with walking or coordination,  change in mood or  memory.        Current Meds  Medication Sig  . acetaminophen (TYLENOL) 325 MG tablet Take 2 tablets (650 mg total) by mouth every 4 (four) hours as needed for headache or mild pain.  Marland Kitchen albuterol (PROAIR HFA) 108 (90 Base) MCG/ACT inhaler Inhale 2 puffs into the lungs every 4-6 hours as needed for shortness of breath or  wheezing  . atorvastatin (LIPITOR) 80 MG tablet Take 1 tablet (80 mg total) by mouth daily at 6 PM.  . CALCIUM PO Take 1 tablet by mouth daily.  . Cholecalciferol (VITAMIN D3) 2000 units capsule Take 2,000 Units by mouth daily.   . clopidogrel (PLAVIX) 75 MG tablet Take 1 tablet (75 mg total) by mouth daily.  . DULoxetine (CYMBALTA) 60 MG capsule Take 60 mg by mouth daily.   . hydrocortisone (ANUSOL-HC) 2.5 % rectal cream Place rectally 2 (two) times daily.  . hydrocortisone (CORTEF) 5 MG tablet Take 10 mg by mouth 2 (two) times daily.  . insulin aspart (NOVOLOG) 100 UNIT/ML injection Inject into the skin 3 (three) times daily before meals.  . Insulin Aspart, w/Niacinamide, (FIASP FLEXTOUCH) 100 UNIT/ML SOPN Inject 2-14 Units into the skin See admin instructions.  Use three times day as needed before meals per sliding scale  . insulin degludec (TRESIBA FLEXTOUCH) 100 UNIT/ML SOPN FlexTouch Pen Inject 40 Units into the skin at bedtime.   . Iron-FA-B Cmp-C-Biot-Probiotic (FUSION PLUS) CAPS Take 1 capsule by mouth daily.  Marland Kitchen levothyroxine (SYNTHROID, LEVOTHROID) 150 MCG tablet Take 150 mcg by mouth daily before breakfast.  . Magnesium 400 MG TABS Take 400 mg by mouth daily.   . nitroGLYCERIN (NITROSTAT) 0.4 MG SL tablet Place 0.4 mg under the tongue every 5 (five) minutes as needed for chest pain.   . OXYGEN 2lpm 24/7  DME- AHP  . polyethylene glycol (MIRALAX / GLYCOLAX) packet Take 17 g by mouth daily.  . potassium chloride SA (K-DUR,KLOR-CON) 20 MEQ tablet Take 1 tablet (20 mEq total) by mouth daily. Take when you take Torsemide.  . pramipexole (MIRAPEX) 1 MG tablet Take 1 mg by mouth 2 (two) times daily.  . promethazine (PHENERGAN) 12.5 MG tablet Take 12.5 mg by mouth every 6 (six) hours as needed for nausea.  . Semaglutide,0.25 or 0.5MG /DOS, (OZEMPIC, 0.25 OR 0.5 MG/DOSE,) 2 MG/1.5ML SOPN Inject into the skin as directed.  . Tiotropium Bromide-Olodaterol (STIOLTO RESPIMAT) 2.5-2.5 MCG/ACT AERS  Inhale 2 puffs into the lungs daily.  Marland Kitchen torsemide (DEMADEX) 20 MG tablet Take 60 mg in the morning and 40 mg in the evening daily.  . TRULICITY 1.5 PJ/0.9TO SOPN Inject 1.5 mg into the skin every Sunday.   Marland Kitchen   ipratropium-albuterol (DUONEB) 0.5-2.5 (3) MG/3ML SOLN Take 3 mLs by nebulization 4 (four) times daily.  .                                Objective:       Wt Readings from Last 3 Encounters:  02/28/18 215 lb (97.5 kg)  01/31/18 212 lb (96.2 kg)  01/31/18 212 lb (96.2 kg)     Vital signs reviewed - Note on arrival 02 sats  96% on 2lpm pulsed       slt hoarse amb wf nad   HEENT: nl dentition / oropharynx. Nl external ear canals without cough reflex -  Mild bilateral non-specific turbinate edema     NECK :  without JVD/Nodes/TM/ nl carotid upstrokes bilaterally   LUNGS: no acc muscle use,  Mild/mod barrel  contour chest wall with bilateral  Distant bs s audible wheeze and  without cough on insp or exp maneuver and mild  Hyperresonant  to  percussion bilaterally     CV:  RRR  no s3 or murmur or increase in P2, and  1+ sym lower ext pitting edema   ABD:  soft and nontender with pos late insp Hoover's  in the supine position. No bruits or organomegaly appreciated, bowel sounds nl  MS:   Nl gait/  ext warm without deformities, calf tenderness, cyanosis or clubbing No obvious joint restrictions   SKIN: warm and dry without lesions    NEURO:  alert, approp, nl sensorium with  no motor or cerebellar deficits apparent.                 Assessment

## 2018-02-28 NOTE — Patient Instructions (Addendum)
Please remember to go to the lab department   for your tests - we will call you with the results when they are available.  The key is to stop smoking completely before smoking completely stops you!   Please schedule a follow up visit in 6 months but call sooner if needed

## 2018-03-01 ENCOUNTER — Encounter: Payer: Self-pay | Admitting: Internal Medicine

## 2018-03-01 NOTE — Assessment & Plan Note (Signed)
First placed on 02 2015 HC03  01/17/18 =  33    Adequate control on present rx, reviewed in detail with pt > no change in rx needed  = 2lpm 24/7

## 2018-03-01 NOTE — Assessment & Plan Note (Signed)
Spirometry 01/31/2018  FEV1 0.9 (39%)  Ratio 63 p am  trelegy - 01/31/2018  After extensive coaching inhaler device,  effectiveness =    75% (short ti) on smi > try stiolto x 2 week sample > improved sltly 02/28/2018  - alpha one AT screening sent   - The proper method of use, as well as anticipated side effects, of a metered-dose inhaler are discussed and demonstrated to the patient.      Pt is Group B in terms of symptom/risk and laba/lama therefore appropriate rx at this point unless exac occur which case can either go back to trelegy or change to symbicort/spiriva to add approved ICS in either case.    Key is completely stopping smoking in meantime (see separate a/p)

## 2018-03-01 NOTE — Assessment & Plan Note (Signed)
4-5 min discussion re active cigarette smoking in addition to office E&M  Ask about tobacco use:   ongoing Advise quitting   I took an extended  opportunity with this patient to outline the consequences of continued cigarette use  in airway disorders based on all the data we have from the multiple national lung health studies (perfomed over decades at millions of dollars in cost)  indicating that smoking cessation, not choice of inhalers or physicians, is the most important aspect of her care.   Assess willingness:  Not committed at this point Assist in quit attempt:  Per PCP when ready Arrange follow up:   Follow up per Primary Care planned      I had an extended discussion with the patient and husband reviewing all relevant studies completed to date and  lasting 15 to 20 minutes of a 25 minute visit    See device teaching which extended face to face time for this visit.  Each maintenance medication was reviewed in detail including emphasizing most importantly the difference between maintenance and prns and under what circumstances the prns are to be triggered using an action plan format that is not reflected in the computer generated alphabetically organized AVS which I have not found useful in most complex patients, especially with respiratory illnesses  Please see AVS for specific instructions unique to this visit that I personally wrote and verbalized to the the pt in detail and then reviewed with pt  by my nurse highlighting any  changes in therapy recommended at today's visit to their plan of care.

## 2018-03-07 ENCOUNTER — Ambulatory Visit: Payer: Federal, State, Local not specified - PPO | Admitting: Cardiology

## 2018-03-07 LAB — ALPHA-1 ANTITRYPSIN PHENOTYPE: A-1 Antitrypsin, Ser: 148 mg/dL (ref 83–199)

## 2018-03-07 NOTE — Progress Notes (Signed)
Left detailed msg with results

## 2018-03-12 DIAGNOSIS — K080 Exfoliation of teeth due to systemic causes: Secondary | ICD-10-CM | POA: Diagnosis not present

## 2018-03-15 ENCOUNTER — Ambulatory Visit (INDEPENDENT_AMBULATORY_CARE_PROVIDER_SITE_OTHER): Payer: Federal, State, Local not specified - PPO | Admitting: Cardiology

## 2018-03-15 ENCOUNTER — Encounter: Payer: Self-pay | Admitting: Cardiology

## 2018-03-15 VITALS — BP 116/62 | HR 90 | Ht 62.0 in | Wt 214.8 lb

## 2018-03-15 DIAGNOSIS — I255 Ischemic cardiomyopathy: Secondary | ICD-10-CM

## 2018-03-15 DIAGNOSIS — M1712 Unilateral primary osteoarthritis, left knee: Secondary | ICD-10-CM | POA: Diagnosis not present

## 2018-03-15 DIAGNOSIS — I10 Essential (primary) hypertension: Secondary | ICD-10-CM

## 2018-03-15 DIAGNOSIS — I25118 Atherosclerotic heart disease of native coronary artery with other forms of angina pectoris: Secondary | ICD-10-CM

## 2018-03-15 DIAGNOSIS — I5042 Chronic combined systolic (congestive) and diastolic (congestive) heart failure: Secondary | ICD-10-CM

## 2018-03-15 DIAGNOSIS — S8002XA Contusion of left knee, initial encounter: Secondary | ICD-10-CM | POA: Diagnosis not present

## 2018-03-15 DIAGNOSIS — I25119 Atherosclerotic heart disease of native coronary artery with unspecified angina pectoris: Secondary | ICD-10-CM

## 2018-03-15 NOTE — Patient Instructions (Signed)
Medication Instructions:  Your physician recommends that you continue on your current medications as directed. Please refer to the Current Medication list given to you today.  If you need a refill on your cardiac medications before your next appointment, please call your pharmacy.   Lab work: Your physician recommends that you return for lab work today: Bnp, bmp  If you have labs (blood work) drawn today and your tests are completely normal, you will receive your results only by: Marland Kitchen MyChart Message (if you have MyChart) OR . A paper copy in the mail If you have any lab test that is abnormal or we need to change your treatment, we will call you to review the results.  Testing/Procedures: None.   Follow-Up: At [facility], you and your health needs are our priority.  As part of our continuing mission to provide you with exceptional heart care, we have created designated Provider Care Teams.  These Care Teams include your primary Cardiologist (physician) and Advanced Practice Providers (APPs -  Physician Assistants and Nurse Practitioners) who all work together to provide you with the care you need, when you need it. You will need a follow up appointment in 3 months.  Please call our office 2 months in advance to schedule this appointment.  You may see Jenne Campus, MD or another member of our [place] Provider Team in [place]: Shirlee More, MD . Jyl Heinz, MD  Any Other Special Instructions Will Be Listed Below (If Applicable).

## 2018-03-15 NOTE — Progress Notes (Signed)
Cardiology Office Note:    Date:  03/15/2018   ID:  Mashawn, [place] 04/11/1955, MRN [id number]  PCP:  Dortha Kern, [place]  Cardiologist:  Jenne Campus, MD    Referring MD: Dortha Kern, [place]   Chief Complaint  Patient presents with  . 1 month follow up  Doing well  History of Present Illness:    Reneta Niehaus is a 62 y.o. female with congestive heart failure, coronary artery disease, COPD.  Comes today to office follow-up overall seems to be doing well she is in good spirits.  Described to have some exertional shortness of breath as usual weight is stable  Past Medical History:  Diagnosis Date  . Acute on chronic systolic heart failure exacerbation(HCC) 04/08/2016  . Arthritis   . CAD in native artery    a. Prior LAD stenting based on cath. b. RCA stenting 03/2016 x2.  . Chronic combined systolic and diastolic CHF (congestive heart failure) ([place])   . CKD (chronic kidney disease), stage II   . COPD (chronic obstructive pulmonary disease) ([place])   . Diabetes mellitus without complication ([place])   . Dyspnea   . Hashimoto's thyroiditis   . Hyperlipidemia   . Hypertension   . Myocardial infarction ([place])   . On home oxygen therapy    "2L; 24/7" (10/23/2017)  . Secondary adrenal insufficiency ([place])   . Thyroid disease   . Tobacco abuse     Past Surgical History:  Procedure Laterality Date  . ABDOMINAL SURGERY    . CESAREAN SECTION    . CHOLECYSTECTOMY    . COLON RESECTION    . COLONOSCOPY WITH PROPOFOL N/A 01/16/2018   Procedure: COLONOSCOPY WITH PROPOFOL;  Surgeon: Rush Landmark Telford Nab., MD;  Location: [facility];  Service: Gastroenterology;  Laterality: N/A;  . CORONARY BALLOON ANGIOPLASTY N/A 10/13/2017   Procedure: CORONARY BALLOON ANGIOPLASTY;  Surgeon: Belva Crome, MD;  Location: [facility];  Service: Cardiovascular;  Laterality: N/A;  . HERNIA MESH REMOVAL    . HERNIA REPAIR    . LEFT HEART CATH AND CORONARY ANGIOGRAPHY N/A  10/13/2016   Procedure: Left Heart Cath and Coronary Angiography;  Surgeon: Nelva Bush, MD;  Location: [facility];  Service: Cardiovascular;  Laterality: N/A;  . POLYPECTOMY  01/16/2018   Procedure: POLYPECTOMY;  Surgeon: Rush Landmark Telford Nab., MD;  Location: [facility];  Service: Gastroenterology;;  . RIGHT/LEFT HEART CATH AND CORONARY ANGIOGRAPHY N/A 10/13/2017   Procedure: RIGHT/LEFT HEART CATH AND CORONARY ANGIOGRAPHY;  Surgeon: Belva Crome, MD;  Location: [facility];  Service: Cardiovascular;  Laterality: N/A;  . SHOULDER ARTHROSCOPY    . TUBAL LIGATION      Current Medications: Current Meds  Medication Sig  . acetaminophen (TYLENOL) 325 MG tablet Take 2 tablets (650 mg total) by mouth every 4 (four) hours as needed for headache or mild pain.  Marland Kitchen albuterol (PROAIR HFA) 108 (90 Base) MCG/ACT inhaler Inhale 2 puffs into the lungs every 4-6 hours as needed for shortness of breath or wheezing  . albuterol (PROVENTIL) (2.5 MG/3ML) 0.083% nebulizer solution Take 3 mLs (2.5 mg total) by nebulization every 6 (six) hours as needed for wheezing or shortness of breath.  Marland Kitchen atorvastatin (LIPITOR) 80 MG tablet Take 1 tablet (80 mg total) by mouth daily at 6 PM.  . CALCIUM PO Take 1 tablet by mouth daily.  . Cholecalciferol (VITAMIN D3) 2000 units capsule Take 2,000 Units by mouth daily.   . clopidogrel (PLAVIX) 75  MG tablet Take 1 tablet (75 mg total) by mouth daily.  . DULoxetine (CYMBALTA) 60 MG capsule Take 60 mg by mouth daily.   . hydrocortisone (ANUSOL-HC) 2.5 % rectal cream Place rectally 2 (two) times daily.  . hydrocortisone (CORTEF) 5 MG tablet Take 10 mg by mouth 2 (two) times daily.  . insulin aspart (NOVOLOG) 100 UNIT/ML FlexPen Inject into the skin 3 (three) times daily before meals.   . Insulin Aspart, w/Niacinamide, (FIASP FLEXTOUCH) 100 UNIT/ML SOPN Inject 2-14 Units into the skin See admin instructions. Use three times day as needed before meals per sliding  scale  . insulin degludec (TRESIBA FLEXTOUCH) 100 UNIT/ML SOPN FlexTouch Pen Inject 40 Units into the skin at bedtime.   . Iron-FA-B Cmp-C-Biot-Probiotic (FUSION PLUS) CAPS Take 1 capsule by mouth daily.  Marland Kitchen levothyroxine (SYNTHROID, LEVOTHROID) 150 MCG tablet Take 150 mcg by mouth daily before breakfast.  . Magnesium 400 MG TABS Take 400 mg by mouth daily.   . nitroGLYCERIN (NITROSTAT) 0.4 MG SL tablet Place 0.4 mg under the tongue every 5 (five) minutes as needed for chest pain.   . OXYGEN 2lpm 24/7  DME- AHP  . polyethylene glycol (MIRALAX / GLYCOLAX) packet Take 17 g by mouth daily.  . potassium chloride SA (K-DUR,KLOR-CON) 20 MEQ tablet Take 1 tablet (20 mEq total) by mouth daily. Take when you take Torsemide.  . pramipexole (MIRAPEX) 1 MG tablet Take 1 mg by mouth 2 (two) times daily.  . Semaglutide,0.25 or 0.5MG /DOS, (OZEMPIC, 0.25 OR 0.5 MG/DOSE,) 2 MG/1.5ML SOPN Inject into the skin as directed.  . Tiotropium Bromide-Olodaterol (STIOLTO RESPIMAT) 2.5-2.5 MCG/ACT AERS Inhale 2 puffs into the lungs daily.  Marland Kitchen torsemide (DEMADEX) 20 MG tablet Take 60 mg in the morning and 40 mg in the evening daily.  . TRULICITY 1.5 MP/5.3IR SOPN Inject 1.5 mg into the skin every Sunday.      Allergies:   Hydroxychloroquine; Donepezil; Prednisone; Anticoagulant cit dext [acd formula a]; Bupropion; Metrizamide; Varenicline; and Tape   Social History   Socioeconomic History  . Marital status: Married    Spouse name: Not on file  . Number of children: Not on file  . Years of education: Not on file  . Highest education level: Not on file  Occupational History  . Not on file  Social Needs  . Financial resource strain: Not on file  . Food insecurity:    Worry: Not on file    Inability: Not on file  . Transportation needs:    Medical: Not on file    Non-medical: Not on file  Tobacco Use  . Smoking status: Current Every Day Smoker    Packs/day: 0.75    Years: 44.00    Pack years: 33.00    Types:  Cigarettes  . Smokeless tobacco: Never Used  Substance and Sexual Activity  . Alcohol use: Yes    Alcohol/week: 7.0 standard drinks    Types: 7 Shots of liquor per week  . Drug use: Yes    Types: Marijuana    Comment: 10/23/2017 "had 1 ~ 1 month ago; nothing before or since"  . Sexual activity: Yes  Lifestyle  . Physical activity:    Days per week: Not on file    Minutes per session: Not on file  . Stress: Not on file  Relationships  . Social connections:    Talks on phone: Patient refused    Gets together: Patient refused    Attends religious service: Patient refused  Active member of club or organization: Patient refused    Attends meetings of clubs or organizations: Patient refused    Relationship status: Patient refused  Other Topics Concern  . Not on file  Social History Narrative  . Not on file     Family History: The patient's family history includes Diabetes in her father, sister, sister, and son; Stroke in her mother. ROS:   Please see the history of present illness.    All 14 point review of systems negative except as described per history of present illness  EKGs/Labs/Other Studies Reviewed:      Recent Labs: 09/16/2017: TSH 2.218 01/04/2018: B Natriuretic Peptide 723.9 01/09/2018: ALT 20 01/16/2018: Magnesium 1.6 01/31/2018: Hemoglobin 11.1; NT-Pro BNP 1,553; Platelets 295 02/05/2018: BUN 22; Creatinine, Ser 1.28; Potassium 4.1; Sodium 143  Recent Lipid Panel    Component Value Date/Time   CHOL 122 10/13/2016 0037   TRIG 68 10/13/2016 0037   HDL 34 (L) 10/13/2016 0037   CHOLHDL 3.6 10/13/2016 0037   VLDL 14 10/13/2016 0037   LDLCALC 74 10/13/2016 0037    Physical Exam:    VS:  BP 116/62   Pulse 90   Ht 5\' 2"  (1.575 m)   Wt 214 lb 12.8 oz (97.4 kg)   SpO2 93%   BMI 39.29 kg/m     Wt Readings from Last 3 Encounters:  03/15/18 214 lb 12.8 oz (97.4 kg)  02/28/18 215 lb (97.5 kg)  01/31/18 212 lb (96.2 kg)     GEN:  Well nourished, well  developed in no acute distress HEENT: Normal NECK: No JVD; No carotid bruits LYMPHATICS: No lymphadenopathy CARDIAC: RRR, no murmurs, no rubs, no gallops RESPIRATORY:  Clear to auscultation without rales, wheezing or rhonchi  ABDOMEN: Soft, non-tender, non-distended MUSCULOSKELETAL:  No edema; No deformity  SKIN: Warm and dry LOWER EXTREMITIES: no swelling NEUROLOGIC:  Alert and oriented x 3 PSYCHIATRIC:  Normal affect   ASSESSMENT:    1. Ischemic cardiomyopathy   2. Chronic combined systolic and diastolic congestive heart failure ([place])   3. Essential hypertension   4. Coronary artery disease of native artery of native heart with stable angina pectoris ([place])   5. Coronary artery disease involving native coronary artery of native heart with angina pectoris ([place])    PLAN:    In order of problems listed above:  1. Coronary artery disease stable on appropriate medications which I will continue. 2. Congestive heart failure clinically seems to be compensated I will ask you to have Chem-7 as well as proBNP 3. COPD followed by pulmonary.  I would continue present medications.  Again proBNP and Chem-7 will be checked we may be forced to adjust her diuretic.   Medication Adjustments/Labs and Tests Ordered: Current medicines are reviewed at length with the patient today.  Concerns regarding medicines are outlined above.  Orders Placed This Encounter  Procedures  . Pro b natriuretic peptide (BNP)  . Basic metabolic panel   Medication changes: No orders of the defined types were placed in this encounter.   Signed, Park Liter, MD, [facility], [place] 03/15/2018 10:34 AM    [place]

## 2018-03-16 LAB — BASIC METABOLIC PANEL
BUN/Creatinine Ratio: 19 (ref 12–28)
BUN: 22 mg/dL (ref 8–27)
CHLORIDE: 96 mmol/L (ref 96–106)
CO2: 29 mmol/L (ref 20–29)
Calcium: 9.4 mg/dL (ref 8.7–10.3)
Creatinine, Ser: 1.13 mg/dL — ABNORMAL HIGH (ref 0.57–1.00)
GFR calc non Af Amer: 52 mL/min/{1.73_m2} — ABNORMAL LOW (ref >59)
GFR, EST AFRICAN AMERICAN: 60 mL/min/{1.73_m2} (ref >59)
Glucose: 75 mg/dL (ref 65–99)
Potassium: 3.6 mmol/L (ref 3.5–5.2)
SODIUM: 140 mmol/L (ref 134–144)

## 2018-03-16 LAB — PRO B NATRIURETIC PEPTIDE: NT-Pro BNP: 858 pg/mL — ABNORMAL HIGH (ref 0–287)

## 2018-03-23 DIAGNOSIS — R262 Difficulty in walking, not elsewhere classified: Secondary | ICD-10-CM | POA: Diagnosis not present

## 2018-03-23 DIAGNOSIS — J9610 Chronic respiratory failure, unspecified whether with hypoxia or hypercapnia: Secondary | ICD-10-CM | POA: Diagnosis not present

## 2018-03-26 DIAGNOSIS — J449 Chronic obstructive pulmonary disease, unspecified: Secondary | ICD-10-CM | POA: Diagnosis not present

## 2018-04-05 DIAGNOSIS — E119 Type 2 diabetes mellitus without complications: Secondary | ICD-10-CM | POA: Diagnosis not present

## 2018-04-09 DIAGNOSIS — E2740 Unspecified adrenocortical insufficiency: Secondary | ICD-10-CM | POA: Diagnosis not present

## 2018-04-09 DIAGNOSIS — E890 Postprocedural hypothyroidism: Secondary | ICD-10-CM | POA: Diagnosis not present

## 2018-04-17 DIAGNOSIS — E890 Postprocedural hypothyroidism: Secondary | ICD-10-CM | POA: Diagnosis not present

## 2018-04-17 DIAGNOSIS — J449 Chronic obstructive pulmonary disease, unspecified: Secondary | ICD-10-CM | POA: Diagnosis not present

## 2018-04-17 DIAGNOSIS — E119 Type 2 diabetes mellitus without complications: Secondary | ICD-10-CM | POA: Diagnosis not present

## 2018-04-17 DIAGNOSIS — I5022 Chronic systolic (congestive) heart failure: Secondary | ICD-10-CM | POA: Diagnosis not present

## 2018-04-20 ENCOUNTER — Ambulatory Visit: Payer: Federal, State, Local not specified - PPO | Admitting: Internal Medicine

## 2018-04-20 ENCOUNTER — Ambulatory Visit (INDEPENDENT_AMBULATORY_CARE_PROVIDER_SITE_OTHER)
Admission: RE | Admit: 2018-04-20 | Discharge: 2018-04-20 | Disposition: A | Discharge status: Home / Self Care | Payer: Federal, State, Local not specified - PPO | Source: Ambulatory Visit | Attending: Internal Medicine | Admitting: Internal Medicine

## 2018-04-20 ENCOUNTER — Encounter: Payer: Self-pay | Admitting: Internal Medicine

## 2018-04-20 VITALS — BP 138/68 | HR 102 | Ht 62.0 in | Wt 219.4 lb

## 2018-04-20 DIAGNOSIS — J9611 Chronic respiratory failure with hypoxia: Secondary | ICD-10-CM

## 2018-04-20 DIAGNOSIS — J449 Chronic obstructive pulmonary disease, unspecified: Secondary | ICD-10-CM | POA: Diagnosis not present

## 2018-04-20 DIAGNOSIS — F17210 Nicotine dependence, cigarettes, uncomplicated: Secondary | ICD-10-CM | POA: Diagnosis not present

## 2018-04-20 DIAGNOSIS — J9612 Chronic respiratory failure with hypercapnia: Secondary | ICD-10-CM | POA: Diagnosis not present

## 2018-04-20 MED ORDER — METHYLPREDNISOLONE ACETATE 80 MG/ML IJ SUSP
120.0000 mg | Freq: Once | INTRAMUSCULAR | Status: AC
  Administered 2018-04-20: 120 mg via INTRAMUSCULAR

## 2018-04-20 NOTE — Progress Notes (Signed)
Spoke with pt and notified of results per Dr. Wert. Pt verbalized understanding and denied any questions. 

## 2018-04-20 NOTE — Progress Notes (Signed)
Crystal Yates, female    DOB: 1956/02/12,     MRN: [id number]    Brief patient profile:  3 yowf active smoker born in [place] and lived in [place] then [place] with heart dz freq stents breathing was good until around 2006  while in [place]  Started seeing Chodri for sob dx as copd/emphysema  then worse 2011 requiring some 02 since 2015  With likely GOLD III copd (with restrictive component) suggested on initial spirometry 01/31/2018   Self referred to pulmonary clinic 01/31/2018 p admit  Date of Admission: 01/04/2018 12:48 AM Date of Discharge: 01/17/2018 Attending Physician: No att. providers fou   Discharge Diagnosis: Principal Problem:   Acute on chronic respiratory failure with hypoxia and hypercapnia ([place]) Active Problems:   Secondary adrenal insufficiency ([place])   Type 2 diabetes mellitus without complication, with long-term current use of insulin ([facility])   Acute on chronic combined systolic and diastolic heart failure ([place])   COPD without exacerbation ([place])   CAD (coronary artery disease)   Iron deficiency   Internal and external bleeding hemorrhoids   Sessile colonic polyp   Diverticulosis of colon without hemorrhage   Echo 10/19/17 Study Conclusions  - Left ventricle: The cavity size was normal. There was mild focal   basal hypertrophy of the septum. Systolic function was normal.   The estimated ejection fraction was in the range of 60% to 65%.   Wall motion was normal; there were no regional wall motion   abnormalities. Doppler parameters are consistent with abnormal   left ventricular relaxation (grade 1 diastolic dysfunction).   Doppler parameters are consistent with indeterminate ventricular   filling pressure. - Aortic valve: Transvalvular velocity was within the normal range.   There was no stenosis. There was no regurgitation. - Mitral valve: Transvalvular velocity was within the normal range.   There was no evidence for stenosis. There was trivial  regurgitation. - Right ventricle: The cavity size was normal. Wall thickness was   normal. Systolic function was normal. - Tricuspid valve: There was trivial regurgitation.    01/31/2018  Pulmonary/ 1st office eval/Crystal Yates  Chief Complaint  Patient presents with  . Pulmonary Consult    Self referral. Pt states that she has COPD. She is on o2. She c/o SOB for the past 4 yrs, worse over the past 2 wks.   Dyspnea:  Can't do any walking at [employer] x 4 months - just room to room at home Cough: doe assoc with more cough than usual worse in am x 1 -2 tsp variably green x one year  Sleep: in recliner 30 degrees  SABA use: saba/ prednisone not much  rec Plan A = Automatic =Stop trelegy and start stiolto 2 pffs each am  Work on inhaler technique:  relax and gently blow all the way out then take a nice smooth deep breath back in, triggering the inhaler at same time you start breathing in.  Hold for up to 5 seconds if you can.   Rinse and gargle with water when done Plan B = Backup Only use your albuterol as a rescue medication  Plan C = Crisis - only use your albuterol nebulizer if you first try Plan B and it fails to help > ok to use the nebulizer up to every 4 hours but if start needing it regularly call for immediate appointment The key is to stop smoking completely before smoking completely stops you!   Please remember to go to the  x-ray department  downstairs in the basement  for your tests - we will call you with the results when they are available.   02/28/2018  f/u ov/Crystal Yates re:  Copd gold III maint on stiolto Chief Complaint  Patient presents with  . Follow-up    Breathing is not doing well today. She states she has good and bad days. She states she rarely uses her albuterol inhaler but she uses her neb 3 x per wk on average. She is still smoking,   Dyspnea:  Able to do 100 ft then sob = MMRC3 = can't walk 100 yards even at a slow pace at a flat grade s stopping due to sob  MMRC3 = can't walk  100 yards even at a slow pace at a flat grade s stopping due to sob   Cough: was better, some worse day of ov but only min mucoid sputum  Sleeping: in recliner x 30 degrees x years  SABA use: rarely 02: 2lpm 24/7  rec Check alpha one > MM No change medication    04/20/2018  f/u ov/Crystal Yates re:  COPD GOLD III criteria / confused with names of meds Chief Complaint  Patient presents with  . Acute Visit    Increased SOB since ran out of trelegy about 2 wks ago. She is using her albuterol inhaler 1-3 x per day. She uses neb 3 x per wk on average. She gets winded just walking a few ft. She is sometimes SOB just sitting. She has been coughing up some yellow sputum over the past 2 days.   Dyspnea:  More trouble walking [employer] barely get in door/ uses scooter, gets let off at door  Cough: minimal slt yellow x 2 days  Sleeping: 45 degrees in recliner SABA use: 2-3 x day, neb qod at most, hfa poor see a/p 02: 2-3lpm    No obvious day to day or daytime variability or assoc e  mucus plugs or hemoptysis or cp or chest tightness, subjective wheeze or overt sinus or hb symptoms.   Sleeping ok though in recliner  without nocturnal  or early am exacerbation  of respiratory  c/o's or need for noct saba. Also denies any obvious fluctuation of symptoms with weather or environmental changes or other aggravating or alleviating factors except as outlined above   No unusual exposure hx or h/o childhood pna/ asthma or knowledge of premature birth.  Current Allergies, Complete Past Medical History, Past Surgical History, Family History, and Social History were reviewed in [employer] [employer] record.  ROS  The following are not active complaints unless bolded Hoarseness, sore throat, dysphagia, dental problems, itching, sneezing,  nasal congestion or discharge of excess mucus or purulent secretions, ear ache,   fever, chills, sweats, unintended wt loss or wt gain, classically pleuritic or exertional  cp,  orthopnea pnd or arm/hand swelling  or leg swelling- slt better , presyncope, palpitations, abdominal pain, anorexia, nausea, vomiting, diarrhea  or change in bowel habits or change in bladder habits, change in stools or change in urine, dysuria, hematuria,  rash, arthralgias, visual complaints, headache, numbness, weakness or ataxia or problems with walking or coordination,  change in mood or  memory.        Current Meds - - NOTE:   Unable to verify as accurately reflecting what pt takes     Medication Sig  . acetaminophen (TYLENOL) 325 MG tablet Take 2 tablets (650 mg total) by mouth every 4 (four) hours as needed for headache or mild  pain.  . albuterol (PROAIR HFA) 108 (90 Base) MCG/ACT inhaler Inhale 2 puffs into the lungs every 4-6 hours as needed for shortness of breath or wheezing  . albuterol (PROVENTIL) (2.5 MG/3ML) 0.083% nebulizer solution Take 3 mLs (2.5 mg total) by nebulization every 6 (six) hours as needed for wheezing or shortness of breath.  Marland Kitchen atorvastatin (LIPITOR) 80 MG tablet Take 1 tablet (80 mg total) by mouth daily at 6 PM.  . CALCIUM PO Take 1 tablet by mouth daily.  . Cholecalciferol (VITAMIN D3) 2000 units capsule Take 2,000 Units by mouth daily.   . clopidogrel (PLAVIX) 75 MG tablet Take 1 tablet (75 mg total) by mouth daily.  . DULoxetine (CYMBALTA) 60 MG capsule Take 60 mg by mouth daily.   . hydrocortisone (ANUSOL-HC) 2.5 % rectal cream Place rectally 2 (two) times daily.  . hydrocortisone (CORTEF) 5 MG tablet Take 10 mg by mouth 2 (two) times daily.  . insulin aspart (NOVOLOG) 100 UNIT/ML FlexPen Inject into the skin 3 (three) times daily before meals.   . Insulin Aspart, w/Niacinamide, (FIASP FLEXTOUCH) 100 UNIT/ML SOPN Inject 2-14 Units into the skin See admin instructions. Use three times day as needed before meals per sliding scale  . insulin degludec (TRESIBA FLEXTOUCH) 100 UNIT/ML SOPN FlexTouch Pen Inject 40 Units into the skin at bedtime.   . Iron-FA-B  Cmp-C-Biot-Probiotic (FUSION PLUS) CAPS Take 1 capsule by mouth daily.  Marland Kitchen levothyroxine (SYNTHROID, LEVOTHROID) 150 MCG tablet Take 150 mcg by mouth daily before breakfast.  . Magnesium 400 MG TABS Take 400 mg by mouth daily.   . nitroGLYCERIN (NITROSTAT) 0.4 MG SL tablet Place 0.4 mg under the tongue every 5 (five) minutes as needed for chest pain.   . OXYGEN 2lpm 24/7  DME- [facility]  . polyethylene glycol (MIRALAX / GLYCOLAX) packet Take 17 g by mouth daily.  . potassium chloride SA (K-DUR,KLOR-CON) 20 MEQ tablet Take 1 tablet (20 mEq total) by mouth daily. Take when you take Torsemide.  . pramipexole (MIRAPEX) 1 MG tablet Take 1 mg by mouth 2 (two) times daily.  . Semaglutide,0.25 or 0.5MG /DOS, (OZEMPIC, 0.25 OR 0.5 MG/DOSE,) 2 MG/1.5ML SOPN Inject into the skin as directed.  . Tiotropium Bromide-Olodaterol (STIOLTO RESPIMAT) 2.5-2.5 MCG/ACT AERS Inhale 2 puffs into the lungs daily.  Marland Kitchen torsemide (DEMADEX) 20 MG tablet Take 60 mg in the morning and 40 mg in the evening daily.  . TRULICITY 1.5 FO/2.7XA SOPN Inject 1.5 mg into the skin every Sunday.                 Objective:      04/20/2018      21 9  02/28/18 215 lb (97.5 kg)  01/31/18 212 lb (96.2 kg)  01/31/18 212 lb (96.2 kg)    Amb talkative wf nad   Vital signs reviewed - Note on arrival 02 sats  91% on 2lpm POC      HEENT: nl dentition / oropharynx. Nl external ear canals without cough reflex -  Mild bilateral non-specific turbinate edema     NECK :  without JVD/Nodes/TM/ nl carotid upstrokes bilaterally   LUNGS: no acc muscle use,  Mod barrel  contour chest wall with bilateral  Distant bs s audible wheeze and  without cough on insp or exp maneuver and mod  Hyperresonant  to  percussion bilaterally     CV:  RRR  no s3 or murmur or increase in P2, and trace sym bilateral LE pitting edema  ABD:  Mod obese/ but soft and nontender with pos mid insp Hoover's  in the supine position. No bruits or organomegaly appreciated, bowel  sounds nl  MS:   Nl gait/  ext warm without deformities, calf tenderness, cyanosis or clubbing No obvious joint restrictions   SKIN: warm and dry without lesions    NEURO:  alert, approp, nl sensorium with  no motor or cerebellar deficits apparent.           CXR PA and Lateral:   04/20/2018 :    I personally reviewed images and agree with radiology impression as follows:    No active cardiopulmonary disease.       Assessment

## 2018-04-20 NOTE — Patient Instructions (Addendum)
The key is to stop smoking completely before smoking completely stops you!  Work on inhaler technique:  relax and gently blow all the way out then take a nice smooth deep breath back in, triggering the inhaler at same time you start breathing in.  Hold for up to 5 seconds if you can. Blow out thru nose. Rinse and gargle with water when done  depomedrol 120 mg IM today   No change in inhalers for now   Please remember to go to the  x-ray department  for your tests - we will call you with the results when they are available    Please schedule a follow up office visit in 4 weeks, sooner if needed  with all medications /inhalers/ solutions in hand so we can verify exactly what you are taking. This includes all medications from all doctors and over the counters - late add: call and see what maint rx she has  Now ? stiolto or trelegy? And if she can afford whichever she has thru her plan and if not return to see NP with formulary to work this out

## 2018-04-22 ENCOUNTER — Encounter: Payer: Self-pay | Admitting: Internal Medicine

## 2018-04-22 NOTE — Assessment & Plan Note (Signed)
4-5 min discussion re active cigarette smoking in addition to office E&M  Ask about tobacco use:   ongoing Advise quitting   I emphasized that although we never turn away smokers from the pulmonary clinic, even when they can't afford they're pulmonary meds but still purchase cigarettes,  we do ask that they understand that the recommendations that we make  won't work nearly as well in the presence of continued cigarette exposure. In fact, we may very well  reach a point where we can't promise to help the patient if he/she can't quit smoking. (We can and will promise to try to help, we just can't promise what we recommend will really work)  Assess willingness:  Not committed at this point Assist in quit attempt:  Per PCP when ready Arrange follow up:   Follow up per Primary Care planned  For smoking cessation classes call [phone]     I had an extended discussion with the patient  reviewing all relevant studies completed to date and  lasting 15 to 20 minutes of a 25 minute visit  which included directly observing ambulatory 02 saturation study documented in a/p section of  today's  office note.  See device teaching which also  extended face to face time for this visit   Each maintenance medication was reviewed in detail including most importantly the difference between maintenance and prns and under what circumstances the prns are to be triggered using an action plan format that is not reflected in the computer generated alphabetically organized AVS.     Please see AVS for specific instructions unique to this visit that I personally wrote and verbalized to the the pt in detail and then reviewed with pt  by my nurse highlighting any changes in therapy recommended at today's visit .

## 2018-04-22 NOTE — Assessment & Plan Note (Addendum)
First placed on 02 2015 HC03  01/17/18 =  33  HCO3  03/15/18 = 29 c/w improving hypercarbia - 04/20/2018   Walked 2lpm pulsed  3 laps @  approx 216ft each @ slow pace  stopped due to  End of study, no desat    Adequate control on present rx, reviewed in detail with pt > no change in rx needed  = 2lpm 24/7

## 2018-04-22 NOTE — Assessment & Plan Note (Addendum)
Active smoker Spirometry 01/31/2018  FEV1 0.9 (39%)  Ratio 63 p am  trelegy - 01/31/2018  After extensive coaching inhaler device,  effectiveness =    75% (short ti) on smi > try stiolto x 2 week sample > improved sltly 02/28/2018  - alpha one AT screening 02/28/2018   MM  Level 148  - Spirometry 04/20/2018  FEV1 1.1 (46%)  Ratio 67  Off rx  - 04/20/2018  After extensive coaching inhaler device,  effectiveness =    50% at most    She actually has more restrictive changes than obstructive but very little reserve either way so rec max rx with trelegy if can get access to it:  Advised:  formulary restrictions will be an ongoing challenge for the forseable future and I would be happy to pick an alternative if the pt will first  provide me a list of them -  pt  will need to return here for training for any new device that is required eg dpi vs hfa vs respimat.    In the meantime we can always provide samples so that the patient never runs out of any needed respiratory medications.   Given depomedrol 120 mg IM today for mild flare.   Return in 4 weeks with all meds in hand using a trust but verify approach to confirm accurate Medication  Reconciliation The principal here is that until we are certain that the  patients are doing what we've asked, it makes no sense to ask them to do more.

## 2018-04-23 ENCOUNTER — Telehealth: Payer: Self-pay | Admitting: *Deleted

## 2018-04-23 NOTE — Telephone Encounter (Signed)
-----   Message from Tanda Rockers, MD sent at 04/22/2018  7:14 AM EST ----- call and see what maint rx she has  Now ? stiolto or trelegy? And if she can afford whichever she has thru her plan and if not return to see NP with formulary to work this out

## 2018-04-23 NOTE — Telephone Encounter (Signed)
LMTCB

## 2018-04-24 NOTE — Telephone Encounter (Signed)
Pt is calling back [phone]

## 2018-04-24 NOTE — Telephone Encounter (Signed)
If this is a brand new problem ie unfamiliar territory she's never had then will need to be evaluated for it as we did not address it at ov.    If she has had it before related to her breathing then should try rescue inhaler and if can't get comfortable will need to be seen rather than attempt to treat it over the phone.

## 2018-04-24 NOTE — Telephone Encounter (Signed)
Spoke with pt. She is currently taking Stiolto. Pt states that she using a coupon to cover this medication. While speaking to the pt she states that she is having chest tightness. Describes it as "having a band around her chest." She would like MW's recommendations.  MW - please advise. Thanks.

## 2018-04-24 NOTE — Telephone Encounter (Signed)
LMTCB

## 2018-04-24 NOTE — Telephone Encounter (Signed)
Called and spoke with Patient.  Dr Melvyn Novas recommendations given.  Understanding stated.  Offered to schedule OV with Dr Melvyn Novas, or one of our NP's.  She refused to schedule OV.  Patient stated that she would call back and schedule OV if she felt like she needed to.

## 2018-04-25 DIAGNOSIS — S301XXS Contusion of abdominal wall, sequela: Secondary | ICD-10-CM | POA: Diagnosis not present

## 2018-04-25 DIAGNOSIS — K432 Incisional hernia without obstruction or gangrene: Secondary | ICD-10-CM | POA: Diagnosis not present

## 2018-04-25 DIAGNOSIS — S301XXD Contusion of abdominal wall, subsequent encounter: Secondary | ICD-10-CM | POA: Diagnosis not present

## 2018-04-25 DIAGNOSIS — E119 Type 2 diabetes mellitus without complications: Secondary | ICD-10-CM | POA: Diagnosis not present

## 2018-04-26 DIAGNOSIS — J449 Chronic obstructive pulmonary disease, unspecified: Secondary | ICD-10-CM | POA: Diagnosis not present

## 2018-05-12 ENCOUNTER — Other Ambulatory Visit: Payer: Self-pay

## 2018-05-12 ENCOUNTER — Encounter (HOSPITAL_COMMUNITY): Payer: Self-pay | Admitting: Emergency Medicine

## 2018-05-12 ENCOUNTER — Emergency Department (HOSPITAL_COMMUNITY)
Admission: EM | Admit: 2018-05-12 | Discharge: 2018-05-13 | Disposition: A | Discharge status: Home / Self Care | Payer: Federal, State, Local not specified - PPO | Attending: Emergency Medicine | Admitting: Emergency Medicine

## 2018-05-12 DIAGNOSIS — Z794 Long term (current) use of insulin: Secondary | ICD-10-CM | POA: Insufficient documentation

## 2018-05-12 DIAGNOSIS — F17210 Nicotine dependence, cigarettes, uncomplicated: Secondary | ICD-10-CM | POA: Diagnosis not present

## 2018-05-12 DIAGNOSIS — I5042 Chronic combined systolic (congestive) and diastolic (congestive) heart failure: Secondary | ICD-10-CM | POA: Insufficient documentation

## 2018-05-12 DIAGNOSIS — N390 Urinary tract infection, site not specified: Secondary | ICD-10-CM | POA: Diagnosis not present

## 2018-05-12 DIAGNOSIS — I130 Hypertensive heart and chronic kidney disease with heart failure and stage 1 through stage 4 chronic kidney disease, or unspecified chronic kidney disease: Secondary | ICD-10-CM | POA: Insufficient documentation

## 2018-05-12 DIAGNOSIS — E119 Type 2 diabetes mellitus without complications: Secondary | ICD-10-CM | POA: Diagnosis not present

## 2018-05-12 DIAGNOSIS — J449 Chronic obstructive pulmonary disease, unspecified: Secondary | ICD-10-CM | POA: Diagnosis not present

## 2018-05-12 DIAGNOSIS — Z79899 Other long term (current) drug therapy: Secondary | ICD-10-CM | POA: Diagnosis not present

## 2018-05-12 DIAGNOSIS — N183 Chronic kidney disease, stage 3 (moderate): Secondary | ICD-10-CM | POA: Diagnosis not present

## 2018-05-12 DIAGNOSIS — R1084 Generalized abdominal pain: Secondary | ICD-10-CM | POA: Diagnosis not present

## 2018-05-12 DIAGNOSIS — N200 Calculus of kidney: Secondary | ICD-10-CM | POA: Diagnosis not present

## 2018-05-12 LAB — COMPREHENSIVE METABOLIC PANEL
ALBUMIN: 3 g/dL — AB (ref 3.5–5.0)
ALT: 19 U/L (ref 0–44)
AST: 22 U/L (ref 15–41)
Alkaline Phosphatase: 92 U/L (ref 38–126)
Anion gap: 11 (ref 5–15)
BUN: 27 mg/dL — AB (ref 8–23)
CO2: 27 mmol/L (ref 22–32)
CREATININE: 1.16 mg/dL — AB (ref 0.44–1.00)
Calcium: 8.9 mg/dL (ref 8.9–10.3)
Chloride: 96 mmol/L — ABNORMAL LOW (ref 98–111)
GFR calc Af Amer: 58 mL/min — ABNORMAL LOW (ref >60)
GFR calc non Af Amer: 50 mL/min — ABNORMAL LOW (ref >60)
GLUCOSE: 164 mg/dL — AB (ref 70–99)
Potassium: 3.6 mmol/L (ref 3.5–5.1)
SODIUM: 134 mmol/L — AB (ref 135–145)
Total Bilirubin: 0.5 mg/dL (ref 0.3–1.2)
Total Protein: 7.3 g/dL (ref 6.5–8.1)

## 2018-05-12 LAB — CBC WITH DIFFERENTIAL/PLATELET
Abs Immature Granulocytes: 0.06 10*3/uL (ref 0.00–0.07)
Basophils Absolute: 0 10*3/uL (ref 0.0–0.1)
Basophils Relative: 0 %
Eosinophils Absolute: 0.1 10*3/uL (ref 0.0–0.5)
Eosinophils Relative: 1 %
HCT: 43.4 % (ref 36.0–46.0)
Hemoglobin: 13.6 g/dL (ref 12.0–15.0)
Immature Granulocytes: 1 %
Lymphocytes Relative: 19 %
Lymphs Abs: 2.1 10*3/uL (ref 0.7–4.0)
MCH: 27.1 pg (ref 26.0–34.0)
MCHC: 31.3 g/dL (ref 30.0–36.0)
MCV: 86.6 fL (ref 80.0–100.0)
Monocytes Absolute: 0.7 10*3/uL (ref 0.1–1.0)
Monocytes Relative: 6 %
Neutro Abs: 8.2 10*3/uL — ABNORMAL HIGH (ref 1.7–7.7)
Neutrophils Relative %: 73 %
Platelets: 252 10*3/uL (ref 150–400)
RBC: 5.01 MIL/uL (ref 3.87–5.11)
RDW: 16.5 % — ABNORMAL HIGH (ref 11.5–15.5)
WBC: 11.2 10*3/uL — ABNORMAL HIGH (ref 4.0–10.5)
nRBC: 0 % (ref 0.0–0.2)

## 2018-05-12 LAB — URINALYSIS, ROUTINE W REFLEX MICROSCOPIC
Bilirubin Urine: NEGATIVE
Glucose, UA: NEGATIVE mg/dL
Ketones, ur: NEGATIVE mg/dL
Nitrite: NEGATIVE
Protein, ur: NEGATIVE mg/dL
Specific Gravity, Urine: 1.011 (ref 1.005–1.030)
pH: 5 (ref 5.0–8.0)

## 2018-05-12 LAB — LIPASE, BLOOD: Lipase: 59 U/L — ABNORMAL HIGH (ref 11–51)

## 2018-05-12 LAB — BRAIN NATRIURETIC PEPTIDE: B Natriuretic Peptide: 140.3 pg/mL — ABNORMAL HIGH (ref 0.0–100.0)

## 2018-05-12 LAB — TROPONIN I: Troponin I: 0.03 ng/mL (ref <0.03)

## 2018-05-12 MED ORDER — MORPHINE SULFATE (PF) 4 MG/ML IV SOLN
4.0000 mg | Freq: Once | INTRAVENOUS | Status: AC
  Administered 2018-05-12: 4 mg via INTRAVENOUS
  Filled 2018-05-12: qty 1

## 2018-05-12 MED ORDER — ONDANSETRON HCL 4 MG/2ML IJ SOLN
4.0000 mg | Freq: Once | INTRAMUSCULAR | Status: AC
  Administered 2018-05-12: 4 mg via INTRAVENOUS
  Filled 2018-05-12: qty 2

## 2018-05-12 MED ORDER — SODIUM CHLORIDE 0.9 % IV BOLUS
500.0000 mL | Freq: Once | INTRAVENOUS | Status: AC
  Administered 2018-05-12: 500 mL via INTRAVENOUS

## 2018-05-12 NOTE — ED Notes (Signed)
Patient transported to CT 

## 2018-05-12 NOTE — ED Triage Notes (Signed)
Pt states she has mesh in her stomach from diverticulitis surgery (2016) her stomach is tight and swollen, reddened around surgery scar. Pt reports tightness and pressure in stomach for the last 2 weeks.

## 2018-05-12 NOTE — ED Notes (Signed)
ED Provider at bedside. 

## 2018-05-12 NOTE — ED Provider Notes (Signed)
[facility] EMERGENCY DEPARTMENT Provider Note   CSN: [id number] Arrival date & time: 05/12/18  2132     History   Chief Complaint Chief Complaint  Patient presents with  . Abdominal Pain    HPI Sydni Elizarraraz is a 63 y.o. female.  She is here with a complaint of mid abdominal pain that is been going on for a couple of weeks.  She says her stomach feels tight and swollen and she is afraid that it is her hernia mesh that is infected again.  She is a history of bowel obstructions.  She is feeling nauseous but no vomiting.  She had a bowel movement today.  No blood from above or below.  No fevers.  She rates the pain as 8 out of 10.  She said she saw her surgeon last week but she does not have much faith in him.  She said she has a lawsuit against the [employer].  She has baseline shortness of breath and has COPD.  No chest pain no urinary symptoms.  The history is provided by the patient.  Abdominal Pain  Pain location:  Epigastric Pain quality: pressure   Pain radiates to:  Does not radiate Pain severity now: 8/10. Onset quality:  Gradual Duration:  2 weeks Timing:  Constant Progression:  Worsening Chronicity:  Recurrent Context: previous surgery   Context: not recent travel, not sick contacts, not suspicious food intake and not trauma   Relieved by:  None tried Worsened by:  Movement and palpation Ineffective treatments:  Bowel activity Associated symptoms: constipation, nausea and shortness of breath   Associated symptoms: no anorexia, no chest pain, no cough, no diarrhea, no dysuria, no fever, no hematemesis, no hematochezia, no hematuria, no melena, no sore throat and no vomiting   Risk factors: multiple surgeries and obesity     Past Medical History:  Diagnosis Date  . Acute on chronic systolic heart failure exacerbation(HCC) 04/08/2016  . Arthritis   . CAD in native artery    a. Prior LAD stenting based on cath. b. RCA stenting 03/2016 x2.   . Chronic combined systolic and diastolic CHF (congestive heart failure) ([place])   . CKD (chronic kidney disease), stage II   . COPD (chronic obstructive pulmonary disease) ([place])   . Diabetes mellitus without complication ([place])   . Dyspnea   . Hashimoto's thyroiditis   . Hyperlipidemia   . Hypertension   . Myocardial infarction ([place])   . On home oxygen therapy    "2L; 24/7" (10/23/2017)  . Secondary adrenal insufficiency ([place])   . Thyroid disease   . Tobacco abuse     Patient Active Problem List   Diagnosis Date Noted  . Chronic respiratory failure with hypoxia and hypercapnia ([place]) 02/01/2018  . Cigarette smoker 02/01/2018  . COPD  GOLD III/ c/b hypoxemia hypercarbia  01/31/2018  . Internal and external bleeding hemorrhoids 01/22/2018  . Sessile colonic polyp 01/22/2018  . Diverticulosis of colon without hemorrhage 01/22/2018  . Iron deficiency 01/11/2018  . Acute on chronic respiratory failure with hypoxia and hypercapnia ([place]) 01/04/2018  . Acute respiratory failure with hypoxia and hypercapnia (HCC)   . AKI (acute kidney injury) ([place])   . Respiratory crackles at left lung base   . Morbid obesity ([place]) 12/29/2017  . Acute on chronic heart failure ([place]) 12/27/2017  . Hyperglycemia   . Coronary stent restenosis due to progression of disease   . Pseudoaneurysm following procedure ([place])   . Pseudoaneurysm (  [place]) 10/17/2017  . S/P angioplasty for ISR of dRCA 10/13/17 10/14/2017  . CAD (coronary artery disease) 10/13/2017  . Right upper lobe pneumonia ([place]) 05/06/2017  . Pneumonia due to respiratory syncytial virus (RSV) 04/21/2017  . Entrapment, radial nerve, right 03/31/2017  . COPD without exacerbation ([place]) 10/14/2016  . Acute on chronic combined systolic and diastolic heart failure ([place]) 10/13/2016  . Chest tightness or pressure 10/12/2016  . Dyspnea on exertion 09/29/2016  . History of coronary artery disease 09/29/2016  . OAB (overactive bladder) 09/21/2016  . Neck pain  09/08/2016  . Paresthesia of arm 09/08/2016  . Flank pain 08/31/2016  . Neuralgia of right upper extremity 04/26/2016  . Coronary artery disease 04/16/2016  . Lymphocele after surgical procedure 04/16/2016  . Ventricular tachycardia (paroxysmal) ([place]) 04/09/2016  . Acute on chronic HFrEF (heart failure with reduced ejection fraction) ([place]) 04/08/2016  . Ischemic cardiomyopathy 04/08/2016  . Type 2 diabetes mellitus without complication, with long-term current use of insulin ([place]) 04/08/2016  . Anemia, blood loss 09/16/2015  . GI bleeding 09/16/2015  . Chest pain 05/22/2015  . Hypothyroid 05/22/2015  . Status post hernia repair 01/29/2015  . Surgical wound breakdown 01/29/2015  . Hernia with strangulation 01/18/2015  . Tobacco abuse 01/18/2015  . Recurrent incisional hernia with incarceration 01/15/2015  . Sleep apnea 01/15/2015  . S/P drug eluting coronary stent placement 10/16/2014  . Anemia 11/21/2013  . Unstable angina ([place]) 11/21/2013  . Atherosclerotic heart disease of native coronary artery without angina pectoris 11/21/2013  . Cardiomyopathy ([place]) 11/21/2013  . Congestive heart failure ([place]) 11/21/2013  . Diabetes mellitus with no complication ([place]) 09/18/7626  . Edema 11/21/2013  . Empty sella syndrome ([place]) 11/21/2013  . Hepatitis 11/21/2013  . Hyperlipemia 11/21/2013  . Hypertension 11/21/2013  . Kidney cyst, acquired 11/21/2013  . Fat necrosis 11/21/2013  . Pulmonary emphysema ([place]) 11/21/2013  . Sjogrens syndrome ([place]) 11/21/2013  . Thyroid disorder 11/21/2013  . Nicotine dependence, uncomplicated 31/51/7616  . Major depressive disorder, recurrent episode ([place]) 09/17/2013  . Displacement of lumbar intervertebral disc 06/14/2013  . Intractable migraine without aura 06/14/2013  . Mild cognitive impairment 06/14/2013  . Polyneuropathy in diabetes ([place]) 06/14/2013  . Secondary adrenal insufficiency ([place]) 04/12/2011  . DDD (degenerative disc disease), lumbosacral  01/11/2008  . Thoracic or lumbosacral neuritis or radiculitis 01/11/2008    Past Surgical History:  Procedure Laterality Date  . ABDOMINAL SURGERY    . CESAREAN SECTION    . CHOLECYSTECTOMY    . COLON RESECTION    . COLONOSCOPY WITH PROPOFOL N/A 01/16/2018   Procedure: COLONOSCOPY WITH PROPOFOL;  Surgeon: Rush Landmark Telford Nab., MD;  Location: [facility];  Service: Gastroenterology;  Laterality: N/A;  . CORONARY BALLOON ANGIOPLASTY N/A 10/13/2017   Procedure: CORONARY BALLOON ANGIOPLASTY;  Surgeon: Belva Crome, MD;  Location: [facility];  Service: Cardiovascular;  Laterality: N/A;  . HERNIA MESH REMOVAL    . HERNIA REPAIR    . LEFT HEART CATH AND CORONARY ANGIOGRAPHY N/A 10/13/2016   Procedure: Left Heart Cath and Coronary Angiography;  Surgeon: Nelva Bush, MD;  Location: [place] [facility] [facility];  Service: Cardiovascular;  Laterality: N/A;  . POLYPECTOMY  01/16/2018   Procedure: POLYPECTOMY;  Surgeon: Rush Landmark Telford Nab., MD;  Location: [facility];  Service: Gastroenterology;;  . RIGHT/LEFT HEART CATH AND CORONARY ANGIOGRAPHY N/A 10/13/2017   Procedure: RIGHT/LEFT HEART CATH AND CORONARY ANGIOGRAPHY;  Surgeon: Belva Crome, MD;  Location: [facility];  Service: Cardiovascular;  Laterality:  N/A;  . SHOULDER ARTHROSCOPY    . TUBAL LIGATION       OB History   No obstetric history on file.      Home Medications    Prior to Admission medications   Medication Sig Start Date End Date Taking? Authorizing Provider  acetaminophen (TYLENOL) 325 MG tablet Take 2 tablets (650 mg total) by mouth every 4 (four) hours as needed for headache or mild pain. 10/14/17   Isaiah Serge, NP  albuterol [facility] HFA) [phone] Base) MCG/ACT inhaler Inhale 2 puffs into the lungs every 4-6 hours as needed for shortness of breath or wheezing 01/31/18   Tanda Rockers, MD  albuterol (PROVENTIL) (2.5 MG/3ML) 0.083% nebulizer solution Take 3 mLs (2.5 mg total) by nebulization every 6  (six) hours as needed for wheezing or shortness of breath. 02/28/18   Tanda Rockers, MD  atorvastatin (LIPITOR) 80 MG tablet Take 1 tablet (80 mg total) by mouth daily at 6 PM. 10/14/17   Isaiah Serge, NP  CALCIUM PO Take 1 tablet by mouth daily.    [provider]  Cholecalciferol (VITAMIN D3) 2000 units capsule Take 2,000 Units by mouth daily.     [provider]  clopidogrel (PLAVIX) 75 MG tablet Take 1 tablet (75 mg total) by mouth daily. 01/20/18   Masoudi, Dorthula Rue, MD  DULoxetine (CYMBALTA) 60 MG capsule Take 60 mg by mouth daily.  12/05/16   [provider]  hydrocortisone (ANUSOL-HC) 2.5 % rectal cream Place rectally 2 (two) times daily. 01/17/18   Masoudi, Elhamalsadat, MD  hydrocortisone (CORTEF) 5 MG tablet Take 10 mg by mouth 2 (two) times daily.    [provider]  insulin aspart (NOVOLOG) 100 UNIT/ML FlexPen Inject into the skin 3 (three) times daily before meals.     [provider]  Insulin Aspart, w/Niacinamide, (FIASP FLEXTOUCH) 100 UNIT/ML SOPN Inject 2-14 Units into the skin See admin instructions. Use three times day as needed before meals per sliding scale    [provider]  insulin degludec (TRESIBA FLEXTOUCH) 100 UNIT/ML SOPN FlexTouch Pen Inject 40 Units into the skin at bedtime.  08/28/16   [provider]  Iron-FA-B Cmp-C-Biot-Probiotic (FUSION PLUS) CAPS Take 1 capsule by mouth daily. 10/09/16   [provider]  levothyroxine (SYNTHROID, LEVOTHROID) 150 MCG tablet Take 150 mcg by mouth daily before breakfast.    [provider]  Magnesium 400 MG TABS Take 400 mg by mouth daily.     [provider]  nitroGLYCERIN (NITROSTAT) 0.4 MG SL tablet Place 0.4 mg under the tongue every 5 (five) minutes as needed for chest pain.     [provider]  OXYGEN 2lpm 24/7  DME- [facility]    [provider]  polyethylene glycol (MIRALAX / GLYCOLAX) packet Take 17 g by mouth daily.  12/27/17   Masoudi, Elhamalsadat, MD  potassium chloride SA (K-DUR,KLOR-CON) 20 MEQ tablet Take 1 tablet (20 mEq total) by mouth daily. Take when you take Torsemide. 01/17/18   Masoudi, Dorthula Rue, MD  pramipexole (MIRAPEX) 1 MG tablet Take 1 mg by mouth 2 (two) times daily.    [provider]  Semaglutide,0.25 or 0.5MG /DOS, (OZEMPIC, 0.25 OR 0.5 MG/DOSE,) 2 MG/1.5ML SOPN Inject into the skin as directed.    [provider]  Tiotropium Bromide-Olodaterol (STIOLTO RESPIMAT) 2.5-2.5 MCG/ACT AERS Inhale 2 puffs into the lungs daily. 01/31/18   Tanda Rockers, MD  torsemide (DEMADEX) 20 MG tablet Take 60 mg in  the morning and 40 mg in the evening daily. 02/02/18   Park Liter, MD  TRULICITY 1.5 ZO/1.0RU SOPN Inject 1.5 mg into the skin every Sunday.  10/10/16   [provider]    Family History Family History  Problem Relation Age of Onset  . Stroke Mother   . Diabetes Father   . Diabetes Sister   . Diabetes Sister   . Diabetes Son     Social History Social History   Tobacco Use  . Smoking status: Current Every Day Smoker    Packs/day: 0.75    Years: 44.00    Pack years: 33.00    Types: Cigarettes  . Smokeless tobacco: Never Used  Substance Use Topics  . Alcohol use: Yes    Alcohol/week: 7.0 standard drinks    Types: 7 Shots of liquor per week  . Drug use: Yes    Types: Marijuana    Comment: 10/23/2017 "had 1 ~ 1 month ago; nothing before or since"     Allergies   Hydroxychloroquine; Donepezil; Prednisone; Anticoagulant cit dext [acd formula a]; Bupropion; Metrizamide; Varenicline; and Tape   Review of Systems Review of Systems  Constitutional: Negative for fever.  HENT: Negative for sore throat.   Eyes: Negative for visual disturbance.  Respiratory: Positive for shortness of breath. Negative for cough.   Cardiovascular: Negative for chest pain.  Gastrointestinal: Positive for abdominal pain, constipation and nausea. Negative for  anorexia, diarrhea, hematemesis, hematochezia, melena and vomiting.  Genitourinary: Negative for dysuria and hematuria.  Musculoskeletal: Negative for neck pain.  Skin: Negative for rash.  Neurological: Negative for syncope.     Physical Exam Updated Vital Signs BP 101/64   Pulse 92   Temp 97.9 F (36.6 C) (Oral)   Resp 16   SpO2 96%   Physical Exam Vitals signs and nursing note reviewed.  Constitutional:      General: She is not in acute distress.    Appearance: She is well-developed.  HENT:     Head: Normocephalic and atraumatic.  Eyes:     Conjunctiva/sclera: Conjunctivae normal.  Neck:     Musculoskeletal: Neck supple.  Cardiovascular:     Rate and Rhythm: Normal rate and regular rhythm.     Heart sounds: Normal heart sounds. No murmur.  Pulmonary:     Effort: Pulmonary effort is normal. No respiratory distress.     Breath sounds: Wheezing (scattered) present.  Abdominal:     Palpations: Abdomen is soft.     Tenderness: There is generalized abdominal tenderness and tenderness in the epigastric area. There is no guarding or rebound.     Hernia: A hernia is present. Hernia is present in the umbilical area and ventral area.  Musculoskeletal:        General: No tenderness.     Right lower leg: No edema.     Left lower leg: No edema.  Skin:    General: Skin is warm and dry.     Capillary Refill: Capillary refill takes less than 2 seconds.  Neurological:     General: No focal deficit present.     Mental Status: She is alert and oriented to person, place, and time.      ED Treatments / Results  Labs (all labs ordered are listed, but only abnormal results are displayed) Labs Reviewed  CBC WITH DIFFERENTIAL/PLATELET - Abnormal; Notable for the following components:      Result Value   WBC 11.2 (*)    RDW 16.5 (*)  Neutro Abs 8.2 (*)    All other components within normal limits  COMPREHENSIVE METABOLIC PANEL - Abnormal; Notable for the following components:     Sodium 134 (*)    Chloride 96 (*)    Glucose, Bld 164 (*)    BUN 27 (*)    Creatinine, Ser 1.16 (*)    Albumin 3.0 (*)    GFR calc non Af Amer 50 (*)    GFR calc Af Amer 58 (*)    All other components within normal limits  LIPASE, BLOOD - Abnormal; Notable for the following components:   Lipase 59 (*)    All other components within normal limits  URINALYSIS, ROUTINE W REFLEX MICROSCOPIC - Abnormal; Notable for the following components:   Hgb urine dipstick SMALL (*)    Leukocytes,Ua TRACE (*)    Bacteria, UA RARE (*)    All other components within normal limits  TROPONIN I - Abnormal; Notable for the following components:   Troponin I 0.03 (*)    All other components within normal limits  BRAIN NATRIURETIC PEPTIDE - Abnormal; Notable for the following components:   B Natriuretic Peptide 140.3 (*)    All other components within normal limits    EKG EKG Interpretation  Date/Time:  Saturday May 12 2018 22:20:20 EST Ventricular Rate:  99 PR Interval:    QRS Duration: 116 QT Interval:  375 QTC Calculation: 482 R Axis:   64 Text Interpretation:  Normal sinus rhythm Nonspecific intraventricular conduction delay Low voltage, precordial leads Borderline T abnormalities, diffuse leads similar to prior 10/19 Confirmed by Aletta Edouard [phone]) on 05/12/2018 10:31:14 PM   Radiology Ct Abdomen Pelvis W Contrast  Result Date: 05/13/2018 CLINICAL DATA:  Acute generalized abdomen pain EXAM: CT ABDOMEN AND PELVIS WITH CONTRAST TECHNIQUE: Multidetector CT imaging of the abdomen and pelvis was performed using the standard protocol following bolus administration of intravenous contrast. CONTRAST:  131mL OMNIPAQUE IOHEXOL 300 MG/ML  SOLN COMPARISON:  December 24, 2017 FINDINGS: Lower chest: The heart size is enlarged. Atelectasis of posterior lung bases are noted. Hepatobiliary: No focal liver abnormality is seen. Status post cholecystectomy. No biliary dilatation. Pancreas:  Unremarkable. No pancreatic ductal dilatation or surrounding inflammatory changes. Spleen: Normal in size without focal abnormality. Adrenals/Urinary Tract: Bilateral adrenal glands are normal. 1 mm nonobstructing stones identified in lower pole right kidney. There is no hydronephrosis bilaterally. Minimal bilateral perinephric stranding is identified chronic. The bladder is normal. Stomach/Bowel: Stomach is within normal limits. The appendix is not seen but no inflammation is noted around cecum. There is prior sigmoid surgery. No evidence of bowel wall thickening, distention, or inflammatory changes. In the mid abdomen, the bowel loops abutting the skin. The patient has a history of hernia repair and hernia mesh removal. Vascular/Lymphatic: Aortic atherosclerosis. No enlarged abdominal or pelvic lymph nodes. Reproductive: Uterus and bilateral adnexa are unremarkable. Other: None. Musculoskeletal: No acute abnormality. IMPRESSION: No acute abnormality is identified in the abdomen and pelvis. In the mid abdomen, the bowel loops abut the skin. The patient has a history of hernia repair with hernia mesh removal. Nonobstructing stone identified in the right kidney. Prior cholecystectomy. Electronically Signed   By: Abelardo Diesel M.D.   On: 05/13/2018 00:40    Procedures Procedures (including critical care time)  Medications Ordered in ED Medications  morphine 4 MG/ML injection 4 mg (has no administration in time range)  sodium chloride 0.9 % bolus 500 mL (has no administration in time range)  ondansetron (ZOFRAN) injection  4 mg (has no administration in time range)     Initial Impression / Assessment and Plan / ED Course  I have reviewed the triage vital signs and the nursing notes.  Pertinent labs & imaging results that were available during my care of the patient were reviewed by me and considered in my medical decision making (see chart for details).  Clinical Course as of May 13 1114  Sat May 12, 9626  2731 63 year old female with multiple abdominal surgeries including bowel obstruction and bowel resection.  She is had a hernia repair and revisions of this.  She is had some chronic issues with this since then.  She is complaining of worsening abdominal pain and increased redness at her ventral hernia site.  She is not clinically obstructed.  She has diffuse tenderness.  She is getting some labs and a CAT scan and receiving some pain medicine fluids and nausea medicine.  Disposition per results of CAT scan and testing.   [MB]  2339 Signed out to Dr. Betsey Holiday with CT pending.  Disposition per results of CT.   [MB]    Clinical Course User Index [MB] Hayden Rasmussen, MD    Final Clinical Impressions(s) / ED Diagnoses   Final diagnoses:  Generalized abdominal pain  Lower urinary tract infectious disease    ED Discharge Orders         Ordered    HYDROcodone-acetaminophen (NORCO/VICODIN) 5-325 MG tablet  Every 4 hours PRN     05/13/18 0134    cephALEXin (KEFLEX) 500 MG capsule  2 times daily     05/13/18 0134           Hayden Rasmussen, MD 05/13/18 [phone]

## 2018-05-13 ENCOUNTER — Emergency Department (HOSPITAL_COMMUNITY): Payer: Federal, State, Local not specified - PPO

## 2018-05-13 DIAGNOSIS — N200 Calculus of kidney: Secondary | ICD-10-CM | POA: Diagnosis not present

## 2018-05-13 MED ORDER — CEPHALEXIN 500 MG PO CAPS: 500.0000 mg | ORAL_CAPSULE | Freq: Two times a day (BID) | ORAL | 0 refills | Status: DC

## 2018-05-13 MED ORDER — HYDROCODONE-ACETAMINOPHEN 5-325 MG PO TABS: 1.0000 | ORAL_TABLET | ORAL | 0 refills | Status: DC | PRN

## 2018-05-13 MED ORDER — MORPHINE SULFATE (PF) 4 MG/ML IV SOLN
4.0000 mg | Freq: Once | INTRAVENOUS | Status: AC
  Administered 2018-05-13: 4 mg via INTRAVENOUS
  Filled 2018-05-13: qty 1

## 2018-05-13 MED ORDER — IOHEXOL 300 MG/ML  SOLN
100.0000 mL | Freq: Once | INTRAMUSCULAR | Status: AC | PRN
  Administered 2018-05-13: 100 mL via INTRAVENOUS

## 2018-05-13 NOTE — ED Provider Notes (Signed)
Patient signed out to me by Dr. Melina Copa to follow-up on CT scan.  Patient has a history of multiple abdominal surgeries and bowel obstructions.  She has been experiencing abdominal pain.  CT has been performed and does not show any acute abnormality.  No inflammatory changes, no sign of obstruction.  Patient comfortable after second dose of morphine.  Urinalysis does have bacteria, esterase, 6-10 white cells.  Will empirically treat for infection.  Patient taking Tylenol at home, will give 2-day supply of Vicodin.  Follow-up with her primary doctor.   Orpah Greek, MD 05/13/18 0120

## 2018-05-13 NOTE — ED Notes (Signed)
Patient verbalizes understanding of discharge instructions. Opportunity for questioning and answers were provided. Armband removed by staff, pt discharged from ED in wheelchair.  

## 2018-05-13 NOTE — ED Notes (Signed)
ED Provider at bedside. 

## 2018-05-16 DIAGNOSIS — K432 Incisional hernia without obstruction or gangrene: Secondary | ICD-10-CM | POA: Diagnosis not present

## 2018-05-21 ENCOUNTER — Ambulatory Visit: Payer: Federal, State, Local not specified - PPO | Admitting: Internal Medicine

## 2018-05-21 ENCOUNTER — Encounter: Payer: Self-pay | Admitting: Internal Medicine

## 2018-05-21 DIAGNOSIS — F17210 Nicotine dependence, cigarettes, uncomplicated: Secondary | ICD-10-CM

## 2018-05-21 DIAGNOSIS — J9611 Chronic respiratory failure with hypoxia: Secondary | ICD-10-CM | POA: Diagnosis not present

## 2018-05-21 DIAGNOSIS — J449 Chronic obstructive pulmonary disease, unspecified: Secondary | ICD-10-CM

## 2018-05-21 DIAGNOSIS — J9612 Chronic respiratory failure with hypercapnia: Secondary | ICD-10-CM | POA: Diagnosis not present

## 2018-05-21 NOTE — Patient Instructions (Addendum)
Plan A = Automatic = stioloto 2 pffs each am   Work on inhaler technique:  relax and gently blow all the way out then take a nice smooth deep breath back in, triggering the inhaler at same time you start breathing in.  Hold for up to 5 seconds if you can. Rinse and gargle with water when done.    Plan B = Backup Only use your albuterol inhaler as a rescue medication to be used if you can't catch your breath by resting or doing a relaxed purse lip breathing pattern.  - The less you use it, the better it will work when you need it. - Ok to use the inhaler up to 2 puffs  every 4 hours if you must but call for appointment if use goes up over your usual need - Don't leave home without it !!  (think of it like the spare tire for your car)   Plan C = Crisis - only use your albuterol nebulizer if you first try Plan B and it fails to help > ok to use the nebulizer up to every 4 hours but if start needing it regularly call for immediate appointment   Please schedule a follow up visit in 6 months but call sooner if needed

## 2018-05-21 NOTE — Progress Notes (Signed)
Crystal Yates, female    DOB: Jul 18, 1955,     MRN: [id number]    Brief patient profile:  76 yowf active smoker born in [place] and lived in [place] then [place] with heart dz freq stents breathing was good until around 2006  while in [place]  Started seeing Chodri for sob dx as copd/emphysema  then worse 2011 requiring some 02 since 2015  With likely GOLD III copd (with restrictive component) suggested on initial spirometry 01/31/2018   Self referred to pulmonary clinic 01/31/2018 p admit  Date of Admission: 01/04/2018 12:48 AM Date of Discharge: 01/17/2018 Attending Physician: No att. providers fou   Discharge Diagnosis: Principal Problem:   Acute on chronic respiratory failure with hypoxia and hypercapnia ([facility]) Active Problems:   Secondary adrenal insufficiency ([place])   Type 2 diabetes mellitus without complication, with long-term current use of insulin ([facility])   Acute on chronic combined systolic and diastolic heart failure ([place])   COPD without exacerbation ([place])   CAD (coronary artery disease)   Iron deficiency   Internal and external bleeding hemorrhoids   Sessile colonic polyp   Diverticulosis of colon without hemorrhage   Echo 10/19/17 Study Conclusions  - Left ventricle: The cavity size was normal. There was mild focal   basal hypertrophy of the septum. Systolic function was normal.   The estimated ejection fraction was in the range of 60% to 65%.   Wall motion was normal; there were no regional wall motion   abnormalities. Doppler parameters are consistent with abnormal   left ventricular relaxation (grade 1 diastolic dysfunction).   Doppler parameters are consistent with indeterminate ventricular   filling pressure. - Aortic valve: Transvalvular velocity was within the normal range.   There was no stenosis. There was no regurgitation. - Mitral valve: Transvalvular velocity was within the normal range.   There was no evidence for stenosis. There was trivial  regurgitation. - Right ventricle: The cavity size was normal. Wall thickness was   normal. Systolic function was normal. - Tricuspid valve: There was trivial regurgitation.    01/31/2018  Pulmonary/ 1st office eval/Yahir Tavano  Chief Complaint  Patient presents with  . Pulmonary Consult    Self referral. Pt states that she has COPD. She is on o2. She c/o SOB for the past 4 yrs, worse over the past 2 wks.   Dyspnea:  Can't do any walking at [employer] x 4 months - just room to room at home Cough: doe assoc with more cough than usual worse in am x 1 -2 tsp variably green x one year  Sleep: in recliner 30 degrees  SABA use: saba/ prednisone not much  rec Plan A = Automatic =Stop trelegy and start stiolto 2 pffs each am  Work on inhaler technique:  relax and gently blow all the way out then take a nice smooth deep breath back in, triggering the inhaler at same time you start breathing in.  Hold for up to 5 seconds if you can.   Rinse and gargle with water when done Plan B = Backup Only use your albuterol as a rescue medication  Plan C = Crisis - only use your albuterol nebulizer if you first try Plan B and it fails to help > ok to use the nebulizer up to every 4 hours but if start needing it regularly call for immediate appointment The key is to stop smoking completely before smoking completely stops you!   Please remember to go to the  x-ray department  downstairs in the basement  for your tests - we will call you with the results when they are available.   02/28/2018  f/u ov/Dam Ashraf re:  Copd gold III maint on stiolto Chief Complaint  Patient presents with  . Follow-up    Breathing is not doing well today. She states she has good and bad days. She states she rarely uses her albuterol inhaler but she uses her neb 3 x per wk on average. She is still smoking,   Dyspnea:  Able to do 100 ft then sob = MMRC3 = can't walk 100 yards even at a slow pace at a flat grade s stopping due to sob  MMRC3 = can't walk  100 yards even at a slow pace at a flat grade s stopping due to sob   Cough: was better, some worse day of ov but only min mucoid sputum  Sleeping: in recliner x 30 degrees x years  SABA use: rarely 02: 2lpm 24/7  rec Check alpha one > MM No change medication    04/20/2018  f/u ov/Alyissa Whidbee re:  COPD GOLD III criteria / confused with names of meds Chief Complaint  Patient presents with  . Acute Visit    Increased SOB since ran out of trelegy about 2 wks ago. She is using her albuterol inhaler 1-3 x per day. She uses neb 3 x per wk on average. She gets winded just walking a few ft. She is sometimes SOB just sitting. She has been coughing up some yellow sputum over the past 2 days.   Dyspnea:  More trouble walking [employer] barely get in door/ uses scooter, gets let off at door  Cough: minimal slt yellow x 2 days  Sleeping: 45 degrees in recliner SABA use: 2-3 x day, neb qod at most, hfa poor see a/p 02: 2-3lpm  rec The key is to stop smoking completely before smoking completely stops you! Work on inhaler technique:  depomedrol 120 mg IM today  Please schedule a follow up office visit in 4 weeks, sooner if needed  with all medications /inhalers/ solutions in hand so we can verify exactly what you are taking. This includes all medications from all doctors and over the counters   05/21/2018  f/u ov/Katieann Hungate re:  GOLD III/   still smoking but did bring most meds  Chief Complaint  Patient presents with  . Follow-up    Increased SOB off and on over the past wk.  She states she gets winded and then loses bladder control.  She is using her albuterol inhaler about 4 x per day and neb maybe once per wk.  Dyspnea:  MMRC3 = can't walk 100 yards even at a slow pace at a flat grade s stopping due to sob   Cough:  Not as much except in am has chest congestion/ rattling but min sputum production   Sleeping: 30 degreees on bed  SABA use: as above  02: 2lpm 24/7   No obvious day to day or daytime variability  or assoc excess/ purulent sputum or mucus plugs or hemoptysis or cp or chest tightness, subjective wheeze or overt sinus or hb symptoms.   Sleeping as above  without nocturnal   exacerbation of respiratory  c/o's or need for noct saba. Also denies any obvious fluctuation of symptoms with weather or environmental changes or other aggravating or alleviating factors except as outlined above   No unusual exposure hx or h/o childhood pna/ asthma or knowledge of premature birth.  Current Allergies, Complete Past Medical History, Past Surgical History, Family History, and Social History were reviewed in [employer] [employer] record.  ROS  The following are not active complaints unless bolded Hoarseness, sore throat, dysphagia, dental problems, itching, sneezing,  nasal congestion or discharge of excess mucus or purulent secretions, ear ache,   fever, chills, sweats, unintended wt loss or wt gain, classically pleuritic or exertional cp,  orthopnea pnd or arm/hand swelling  or leg swelling, presyncope, palpitations, abdominal pain, anorexia, nausea, vomiting, diarrhea  or change in bowel habits or change in bladder habits, change in stools or change in urine, dysuria, hematuria,  rash, arthralgias, visual complaints, headache, numbness, weakness or ataxia or problems with walking or coordination,  change in mood or  memory.        Current Meds  Medication Sig  . acetaminophen (TYLENOL) 325 MG tablet Take 2 tablets (650 mg total) by mouth every 4 (four) hours as needed for headache or mild pain.  Marland Kitchen albuterol (PROAIR HFA) 108 (90 Base) MCG/ACT inhaler Inhale 2 puffs into the lungs every 4-6 hours as needed for shortness of breath or wheezing  . albuterol (PROVENTIL) (2.5 MG/3ML) 0.083% nebulizer solution Take 3 mLs (2.5 mg total) by nebulization every 6 (six) hours as needed for wheezing or shortness of breath.  Marland Kitchen atorvastatin (LIPITOR) 80 MG tablet Take 1 tablet (80 mg total) by mouth daily at 6  PM.  . CALCIUM PO Take 1 tablet by mouth daily.  . cephALEXin (KEFLEX) 500 MG capsule Take 1 capsule (500 mg total) by mouth 2 (two) times daily.  . Cholecalciferol (VITAMIN D3) 2000 units capsule Take 2,000 Units by mouth daily.   . clopidogrel (PLAVIX) 75 MG tablet Take 1 tablet (75 mg total) by mouth daily.  . DULoxetine (CYMBALTA) 60 MG capsule Take 60 mg by mouth daily.   . Galcanezumab-gnlm (EMGALITY) 120 MG/ML SOSY 1 injection per month  . HYDROcodone-acetaminophen (NORCO/VICODIN) 5-325 MG tablet Take 1 tablet by mouth every 4 (four) hours as needed for moderate pain.  . hydrocortisone (ANUSOL-HC) 2.5 % rectal cream Place rectally 2 (two) times daily.  . hydrocortisone (CORTEF) 5 MG tablet Take 10 mg by mouth 2 (two) times daily.  . insulin degludec (TRESIBA FLEXTOUCH) 100 UNIT/ML SOPN FlexTouch Pen Inject 40 Units into the skin at bedtime.   . Iron-FA-B Cmp-C-Biot-Probiotic (FUSION PLUS) CAPS Take 1 capsule by mouth daily.  Marland Kitchen levothyroxine (SYNTHROID, LEVOTHROID) 150 MCG tablet Take 150 mcg by mouth daily before breakfast.  . Magnesium 400 MG TABS Take 400 mg by mouth daily.   . nitroGLYCERIN (NITROSTAT) 0.4 MG SL tablet Place 0.4 mg under the tongue every 5 (five) minutes as needed for chest pain.   . OXYGEN 2lpm 24/7  DME- [facility]  . polyethylene glycol (MIRALAX / GLYCOLAX) packet Take 17 g by mouth daily. (Patient taking differently: Take 17 g by mouth daily as needed. )  . potassium chloride SA (K-DUR,KLOR-CON) 20 MEQ tablet Take 1 tablet (20 mEq total) by mouth daily. Take when you take Torsemide.  . pramipexole (MIRAPEX) 1 MG tablet Take 1 mg by mouth 2 (two) times daily.  . Suvorexant (BELSOMRA) 20 MG TABS Take 1 capsule by mouth at bedtime.  . Tiotropium Bromide-Olodaterol (STIOLTO RESPIMAT) 2.5-2.5 MCG/ACT AERS Inhale 2 puffs into the lungs daily.  Marland Kitchen torsemide (DEMADEX) 20 MG tablet Take 60 mg in the morning and 40 mg in the evening daily.  . TRULICITY 1.5 ZM/6.2HU SOPN Inject 1.5  mg  into the skin every Sunday.                     Objective:     05/21/2018       219   04/20/2018      21 9  02/28/18 215 lb (97.5 kg)  01/31/18 212 lb (96.2 kg)  01/31/18 212 lb (96.2 kg)    amn mod obese wf nad using rollator   Vital signs reviewed - Note on arrival 02 sats  96% on 2lpm POC       HEENT: Full dentures/ nl oropharynx. Nl external ear canals without cough reflex -  Mod bilateral non-specific turbinate edema     NECK :  without JVD/Nodes/TM/ nl carotid upstrokes bilaterally   LUNGS: no acc muscle use,  Mod barrel  contour chest wall with bilateral insp/exp rhonchi  without cough on insp or exp maneuver and mod  Hyperresonant  to  percussion bilaterally     CV:  RRR  no s3 or murmur or increase in P2, and no edema   ABD:  soft and nontender with pos mid  insp Hoover's  in the supine position. No bruits or organomegaly appreciated, bowel sounds nl  MS:   Nl gait/  ext warm without deformities, calf tenderness, cyanosis or clubbing No obvious joint restrictions   SKIN: warm and dry without lesions    NEURO:  alert, approp, nl sensorium with  no motor or cerebellar deficits apparent.             Assessment

## 2018-05-26 DIAGNOSIS — J449 Chronic obstructive pulmonary disease, unspecified: Secondary | ICD-10-CM | POA: Diagnosis not present

## 2018-05-27 ENCOUNTER — Encounter: Payer: Self-pay | Admitting: Internal Medicine

## 2018-05-27 NOTE — Assessment & Plan Note (Signed)
First placed on 04/2013 HC03  01/17/18 =  33  HCO3  03/15/18 = 29 c/w improving hypercarbia - 04/20/2018   Walked 2lpm pulsed  3 laps @  approx 2101ft each @ slow pace  stopped due to  End of study, no desat     Adequate control on present rx, reviewed in detail with pt > no change in rx needed

## 2018-05-27 NOTE — Assessment & Plan Note (Signed)

## 2018-05-27 NOTE — Assessment & Plan Note (Addendum)
Active smoker Spirometry 01/31/2018  FEV1 0.9 (39%)  Ratio 63 p am  trelegy - 01/31/2018  After extensive coaching inhaler device,  effectiveness =    75% (short ti) on smi > try stiolto x 2 week sample > improved sltly 02/28/2018  - alpha one AT screening 02/28/2018   MM  Level 148  - Spirometry 04/20/2018  FEV1 1.1 (46%)  Ratio 67  Off rx   05/21/2018  After extensive coaching inhaler device,  effectiveness =    90%    Pt continues to be  Group B in terms of symptom/risk and laba/lama therefore appropriate rx at this point >>>  Continue stiolto and work on maintaining good inhalations of meds and less inhalation of cigs (see separate a/p)

## 2018-06-03 ENCOUNTER — Emergency Department (HOSPITAL_COMMUNITY)
Admission: EM | Admit: 2018-06-03 | Discharge: 2018-06-03 | Disposition: A | Discharge status: Home / Self Care | Payer: Federal, State, Local not specified - PPO | Attending: Emergency Medicine | Admitting: Emergency Medicine

## 2018-06-03 ENCOUNTER — Other Ambulatory Visit: Payer: Self-pay

## 2018-06-03 ENCOUNTER — Encounter (HOSPITAL_COMMUNITY): Payer: Self-pay | Admitting: Emergency Medicine

## 2018-06-03 ENCOUNTER — Emergency Department (HOSPITAL_COMMUNITY): Payer: Federal, State, Local not specified - PPO

## 2018-06-03 DIAGNOSIS — R079 Chest pain, unspecified: Secondary | ICD-10-CM | POA: Diagnosis not present

## 2018-06-03 DIAGNOSIS — J40 Bronchitis, not specified as acute or chronic: Secondary | ICD-10-CM | POA: Diagnosis not present

## 2018-06-03 DIAGNOSIS — Z7902 Long term (current) use of antithrombotics/antiplatelets: Secondary | ICD-10-CM | POA: Diagnosis not present

## 2018-06-03 DIAGNOSIS — Z794 Long term (current) use of insulin: Secondary | ICD-10-CM | POA: Insufficient documentation

## 2018-06-03 DIAGNOSIS — Z79899 Other long term (current) drug therapy: Secondary | ICD-10-CM | POA: Diagnosis not present

## 2018-06-03 DIAGNOSIS — N182 Chronic kidney disease, stage 2 (mild): Secondary | ICD-10-CM | POA: Diagnosis not present

## 2018-06-03 DIAGNOSIS — R600 Localized edema: Secondary | ICD-10-CM | POA: Diagnosis not present

## 2018-06-03 DIAGNOSIS — I2510 Atherosclerotic heart disease of native coronary artery without angina pectoris: Secondary | ICD-10-CM | POA: Diagnosis not present

## 2018-06-03 DIAGNOSIS — I252 Old myocardial infarction: Secondary | ICD-10-CM | POA: Diagnosis not present

## 2018-06-03 DIAGNOSIS — E1122 Type 2 diabetes mellitus with diabetic chronic kidney disease: Secondary | ICD-10-CM | POA: Diagnosis not present

## 2018-06-03 DIAGNOSIS — Z9981 Dependence on supplemental oxygen: Secondary | ICD-10-CM | POA: Insufficient documentation

## 2018-06-03 DIAGNOSIS — I5042 Chronic combined systolic (congestive) and diastolic (congestive) heart failure: Secondary | ICD-10-CM | POA: Diagnosis not present

## 2018-06-03 DIAGNOSIS — F17210 Nicotine dependence, cigarettes, uncomplicated: Secondary | ICD-10-CM | POA: Insufficient documentation

## 2018-06-03 DIAGNOSIS — I130 Hypertensive heart and chronic kidney disease with heart failure and stage 1 through stage 4 chronic kidney disease, or unspecified chronic kidney disease: Secondary | ICD-10-CM | POA: Diagnosis not present

## 2018-06-03 DIAGNOSIS — R05 Cough: Secondary | ICD-10-CM | POA: Diagnosis not present

## 2018-06-03 DIAGNOSIS — R0602 Shortness of breath: Secondary | ICD-10-CM | POA: Diagnosis not present

## 2018-06-03 LAB — CBC
HCT: 43.1 % (ref 36.0–46.0)
Hemoglobin: 13.3 g/dL (ref 12.0–15.0)
MCH: 27.9 pg (ref 26.0–34.0)
MCHC: 30.9 g/dL (ref 30.0–36.0)
MCV: 90.5 fL (ref 80.0–100.0)
Platelets: 249 10*3/uL (ref 150–400)
RBC: 4.76 MIL/uL (ref 3.87–5.11)
RDW: 18 % — ABNORMAL HIGH (ref 11.5–15.5)
WBC: 11.5 10*3/uL — ABNORMAL HIGH (ref 4.0–10.5)
nRBC: 0 % (ref 0.0–0.2)

## 2018-06-03 LAB — BASIC METABOLIC PANEL
Anion gap: 8 (ref 5–15)
BUN: 18 mg/dL (ref 8–23)
CO2: 28 mmol/L (ref 22–32)
Calcium: 9.7 mg/dL (ref 8.9–10.3)
Chloride: 102 mmol/L (ref 98–111)
Creatinine, Ser: 1.07 mg/dL — ABNORMAL HIGH (ref 0.44–1.00)
GFR calc Af Amer: >60 mL/min (ref >60)
GFR calc non Af Amer: 56 mL/min — ABNORMAL LOW (ref >60)
Glucose, Bld: 270 mg/dL — ABNORMAL HIGH (ref 70–99)
Potassium: 4.5 mmol/L (ref 3.5–5.1)
Sodium: 138 mmol/L (ref 135–145)

## 2018-06-03 LAB — I-STAT TROPONIN, ED: Troponin i, poc: 0.04 ng/mL (ref 0.00–0.08)

## 2018-06-03 LAB — BRAIN NATRIURETIC PEPTIDE: B Natriuretic Peptide: 331 pg/mL — ABNORMAL HIGH (ref 0.0–100.0)

## 2018-06-03 MED ORDER — FUROSEMIDE 10 MG/ML IJ SOLN
40.0000 mg | Freq: Once | INTRAMUSCULAR | Status: AC
  Administered 2018-06-03: 40 mg via INTRAVENOUS
  Filled 2018-06-03: qty 4

## 2018-06-03 MED ORDER — MORPHINE SULFATE (PF) 4 MG/ML IV SOLN
4.0000 mg | Freq: Once | INTRAVENOUS | Status: AC
  Administered 2018-06-03: 4 mg via INTRAVENOUS
  Filled 2018-06-03: qty 1

## 2018-06-03 MED ORDER — IPRATROPIUM-ALBUTEROL 0.5-2.5 (3) MG/3ML IN SOLN
3.0000 mL | Freq: Once | RESPIRATORY_TRACT | Status: AC
  Administered 2018-06-03: 3 mL via RESPIRATORY_TRACT
  Filled 2018-06-03: qty 3

## 2018-06-03 MED ORDER — ALBUTEROL SULFATE (2.5 MG/3ML) 0.083% IN NEBU
5.0000 mg | INHALATION_SOLUTION | Freq: Once | RESPIRATORY_TRACT | Status: AC
  Administered 2018-06-03: 5 mg via RESPIRATORY_TRACT
  Filled 2018-06-03: qty 6

## 2018-06-03 MED ORDER — PREDNISONE 20 MG PO TABS: ORAL_TABLET | ORAL | 0 refills | Status: DC

## 2018-06-03 MED ORDER — BENZONATATE 100 MG PO CAPS: 100.0000 mg | ORAL_CAPSULE | Freq: Three times a day (TID) | ORAL | 0 refills | Status: DC

## 2018-06-03 MED ORDER — METHYLPREDNISOLONE SODIUM SUCC 125 MG IJ SOLR
125.0000 mg | Freq: Once | INTRAMUSCULAR | Status: AC
  Administered 2018-06-03: 125 mg via INTRAVENOUS
  Filled 2018-06-03: qty 2

## 2018-06-03 NOTE — ED Notes (Signed)
Patient ambulated without difficulty. O2 saturation dropped to 97% on baseline 2L. Pt reports shortness of breath while ambulating but vitals remain stable. PA Gertie Fey made aware.

## 2018-06-03 NOTE — Discharge Instructions (Signed)
Your symptoms are suggestive of bronchitis.  Please take steroid as prescribe, take cough medication as needed and continue using your albuterol at home as needed.  Please monitor your blood sugar carefully when on prednisone as it may cause a spike.  Follow up with your doctor for further care.  Return if your condition worsen or if you have any concerns.

## 2018-06-03 NOTE — ED Notes (Signed)
ED Provider at bedside. 

## 2018-06-03 NOTE — ED Notes (Signed)
Patient transported to X-ray 

## 2018-06-03 NOTE — ED Triage Notes (Signed)
The patient said she started having trouble breathing three days ago and it has gotten  progressively worse.  She noticed she is swelling and has gained three pounds in the last 24hrs.  She says she does have CHF.  She has tried neb treatments and the inhalers and nothing is working.  Has productive cough that is yellow for last seven days.  She rates her pain on her right side comes around to the front.

## 2018-06-03 NOTE — ED Notes (Signed)
Patient returned from xray.

## 2018-06-03 NOTE — ED Notes (Signed)
Patient verbalizes understanding of discharge instructions. Opportunity for questioning and answers were provided. Armband removed by staff, pt discharged from ED.  

## 2018-06-03 NOTE — ED Provider Notes (Signed)
[facility] EMERGENCY DEPARTMENT Provider Note   CSN: [id number] Arrival date & time: 06/03/18  1138    History   Chief Complaint Chief Complaint  Patient presents with  . Chest Pain  . Shortness of Breath    HPI Crystal Yates is a 63 y.o. female.     The history is provided by the patient and medical records. No language interpreter was used.  Chest Pain  Associated symptoms: shortness of breath   Shortness of Breath  Associated symptoms: chest pain      63 year old female with history of CAD status post PCI, COPD, CKD, hypertension, on home oxygen presenting complaining difficulty breathing.  Patient report for the past 2 days she has noticed increased fluid gain approximately 3 pounds which is more noticeable in her lower extremities as well as abdomen.  She also increased difficulty breathing especially with exertion.  She complains of wheezing, chest tightness and feeling weak.  Symptoms are moderate in severity.  She denies any associated fever, productive cough, hemoptysis, diaphoresis, nausea or vomiting.  She wears oxygen at home at 2 L but has to increase her oxygen consumption.  She denies any medication changes.  She is currently taking fluid pills.  She reported having a mesh to her abdomen and has developed a seroma which has caused her pain.  This is not new.  Patient does admits to tobacco use and smokes at least a pack a day.   Past Medical History:  Diagnosis Date  . Acute on chronic systolic heart failure exacerbation(HCC) 04/08/2016  . Arthritis   . CAD in native artery    a. Prior LAD stenting based on cath. b. RCA stenting 03/2016 x2.  . Chronic combined systolic and diastolic CHF (congestive heart failure) ([place])   . CKD (chronic kidney disease), stage II   . COPD (chronic obstructive pulmonary disease) ([place])   . Diabetes mellitus without complication ([place])   . Dyspnea   . Hashimoto's thyroiditis   . Hyperlipidemia   .  Hypertension   . Myocardial infarction ([place])   . On home oxygen therapy    "2L; 24/7" (10/23/2017)  . Secondary adrenal insufficiency ([place])   . Thyroid disease   . Tobacco abuse     Patient Active Problem List   Diagnosis Date Noted  . Chronic respiratory failure with hypoxia and hypercapnia ([place]) 02/01/2018  . Cigarette smoker 02/01/2018  . COPD  GOLD III/ c/b hypoxemia hypercarbia  01/31/2018  . Internal and external bleeding hemorrhoids 01/22/2018  . Sessile colonic polyp 01/22/2018  . Diverticulosis of colon without hemorrhage 01/22/2018  . Iron deficiency 01/11/2018  . Acute on chronic respiratory failure with hypoxia and hypercapnia ([place]) 01/04/2018  . Acute respiratory failure with hypoxia and hypercapnia (HCC)   . AKI (acute kidney injury) ([place])   . Respiratory crackles at left lung base   . Morbid obesity ([place]) 12/29/2017  . Acute on chronic heart failure ([place]) 12/27/2017  . Hyperglycemia   . Coronary stent restenosis due to progression of disease   . Pseudoaneurysm following procedure ([place])   . Pseudoaneurysm ([place]) 10/17/2017  . S/P angioplasty for ISR of dRCA 10/13/17 10/14/2017  . CAD (coronary artery disease) 10/13/2017  . Right upper lobe pneumonia ([place]) 05/06/2017  . Pneumonia due to respiratory syncytial virus (RSV) 04/21/2017  . Entrapment, radial nerve, right 03/31/2017  . COPD without exacerbation ([place]) 10/14/2016  . Acute on chronic combined systolic and diastolic heart failure ([place]) 10/13/2016  . Chest  tightness or pressure 10/12/2016  . Dyspnea on exertion 09/29/2016  . History of coronary artery disease 09/29/2016  . OAB (overactive bladder) 09/21/2016  . Neck pain 09/08/2016  . Paresthesia of arm 09/08/2016  . Flank pain 08/31/2016  . Neuralgia of right upper extremity 04/26/2016  . Coronary artery disease 04/16/2016  . Lymphocele after surgical procedure 04/16/2016  . Ventricular tachycardia (paroxysmal) ([place]) 04/09/2016  . Acute on chronic HFrEF  (heart failure with reduced ejection fraction) ([place]) 04/08/2016  . Ischemic cardiomyopathy 04/08/2016  . Type 2 diabetes mellitus without complication, with long-term current use of insulin ([place]) 04/08/2016  . Anemia, blood loss 09/16/2015  . GI bleeding 09/16/2015  . Chest pain 05/22/2015  . Hypothyroid 05/22/2015  . Status post hernia repair 01/29/2015  . Surgical wound breakdown 01/29/2015  . Hernia with strangulation 01/18/2015  . Tobacco abuse 01/18/2015  . Recurrent incisional hernia with incarceration 01/15/2015  . Sleep apnea 01/15/2015  . S/P drug eluting coronary stent placement 10/16/2014  . Anemia 11/21/2013  . Unstable angina ([place]) 11/21/2013  . Atherosclerotic heart disease of native coronary artery without angina pectoris 11/21/2013  . Cardiomyopathy ([place]) 11/21/2013  . Congestive heart failure ([place]) 11/21/2013  . Diabetes mellitus with no complication ([place]) 40/12/2723  . Edema 11/21/2013  . Empty sella syndrome ([place]) 11/21/2013  . Hepatitis 11/21/2013  . Hyperlipemia 11/21/2013  . Hypertension 11/21/2013  . Kidney cyst, acquired 11/21/2013  . Fat necrosis 11/21/2013  . Pulmonary emphysema ([place]) 11/21/2013  . Sjogrens syndrome ([place]) 11/21/2013  . Thyroid disorder 11/21/2013  . Nicotine dependence, uncomplicated 36/64/4034  . Major depressive disorder, recurrent episode ([place]) 09/17/2013  . Displacement of lumbar intervertebral disc 06/14/2013  . Intractable migraine without aura 06/14/2013  . Mild cognitive impairment 06/14/2013  . Polyneuropathy in diabetes ([place]) 06/14/2013  . Secondary adrenal insufficiency ([place]) 04/12/2011  . DDD (degenerative disc disease), lumbosacral 01/11/2008  . Thoracic or lumbosacral neuritis or radiculitis 01/11/2008    Past Surgical History:  Procedure Laterality Date  . ABDOMINAL SURGERY    . CESAREAN SECTION    . CHOLECYSTECTOMY    . COLON RESECTION    . COLONOSCOPY WITH PROPOFOL N/A 01/16/2018   Procedure: COLONOSCOPY WITH  PROPOFOL;  Surgeon: Rush Landmark Telford Nab., MD;  Location: [facility];  Service: Gastroenterology;  Laterality: N/A;  . CORONARY BALLOON ANGIOPLASTY N/A 10/13/2017   Procedure: CORONARY BALLOON ANGIOPLASTY;  Surgeon: Belva Crome, MD;  Location: [facility];  Service: Cardiovascular;  Laterality: N/A;  . HERNIA MESH REMOVAL    . HERNIA REPAIR    . LEFT HEART CATH AND CORONARY ANGIOGRAPHY N/A 10/13/2016   Procedure: Left Heart Cath and Coronary Angiography;  Surgeon: Nelva Bush, MD;  Location: [facility];  Service: Cardiovascular;  Laterality: N/A;  . POLYPECTOMY  01/16/2018   Procedure: POLYPECTOMY;  Surgeon: Rush Landmark Telford Nab., MD;  Location: [facility];  Service: Gastroenterology;;  . RIGHT/LEFT HEART CATH AND CORONARY ANGIOGRAPHY N/A 10/13/2017   Procedure: RIGHT/LEFT HEART CATH AND CORONARY ANGIOGRAPHY;  Surgeon: Belva Crome, MD;  Location: [facility];  Service: Cardiovascular;  Laterality: N/A;  . SHOULDER ARTHROSCOPY    . TUBAL LIGATION       OB History   No obstetric history on file.      Home Medications    Prior to Admission medications   Medication Sig Start Date End Date Taking? Authorizing Provider  acetaminophen (TYLENOL) 325 MG tablet Take 2 tablets (650 mg total) by mouth every 4 (  four) hours as needed for headache or mild pain. 10/14/17   Isaiah Serge, NP  albuterol [facility] HFA) [phone] Base) MCG/ACT inhaler Inhale 2 puffs into the lungs every 4-6 hours as needed for shortness of breath or wheezing 01/31/18   Tanda Rockers, MD  albuterol (PROVENTIL) (2.5 MG/3ML) 0.083% nebulizer solution Take 3 mLs (2.5 mg total) by nebulization every 6 (six) hours as needed for wheezing or shortness of breath. 02/28/18   Tanda Rockers, MD  atorvastatin (LIPITOR) 80 MG tablet Take 1 tablet (80 mg total) by mouth daily at 6 PM. 10/14/17   Isaiah Serge, NP  CALCIUM PO Take 1 tablet by mouth daily.    [provider]  cephALEXin  (KEFLEX) 500 MG capsule Take 1 capsule (500 mg total) by mouth 2 (two) times daily. 05/13/18   Orpah Greek, MD  Cholecalciferol (VITAMIN D3) 2000 units capsule Take 2,000 Units by mouth daily.     [provider]  clopidogrel (PLAVIX) 75 MG tablet Take 1 tablet (75 mg total) by mouth daily. 01/20/18   Masoudi, Dorthula Rue, MD  DULoxetine (CYMBALTA) 60 MG capsule Take 60 mg by mouth daily.  12/05/16   [provider]  Galcanezumab-gnlm (EMGALITY) 120 MG/ML SOSY 1 injection per month    [provider]  HYDROcodone-acetaminophen (NORCO/VICODIN) 5-325 MG tablet Take 1 tablet by mouth every 4 (four) hours as needed for moderate pain. 05/13/18   Orpah Greek, MD  hydrocortisone (ANUSOL-HC) 2.5 % rectal cream Place rectally 2 (two) times daily. 01/17/18   Masoudi, Elhamalsadat, MD  hydrocortisone (CORTEF) 5 MG tablet Take 10 mg by mouth 2 (two) times daily.    [provider]  insulin degludec (TRESIBA FLEXTOUCH) 100 UNIT/ML SOPN FlexTouch Pen Inject 40 Units into the skin at bedtime.  08/28/16   [provider]  Iron-FA-B Cmp-C-Biot-Probiotic (FUSION PLUS) CAPS Take 1 capsule by mouth daily. 10/09/16   [provider]  levothyroxine (SYNTHROID, LEVOTHROID) 150 MCG tablet Take 150 mcg by mouth daily before breakfast.    [provider]  Magnesium 400 MG TABS Take 400 mg by mouth daily.     [provider]  nitroGLYCERIN (NITROSTAT) 0.4 MG SL tablet Place 0.4 mg under the tongue every 5 (five) minutes as needed for chest pain.     [provider]  OXYGEN 2lpm 24/7  DME- [facility]    [provider]  polyethylene glycol (MIRALAX / GLYCOLAX) packet Take 17 g by mouth daily. Patient taking differently: Take 17 g by mouth daily as needed.  12/27/17   Masoudi, Elhamalsadat, MD  potassium chloride SA (K-DUR,KLOR-CON) 20 MEQ tablet Take 1 tablet (20 mEq total) by mouth daily. Take when you take Torsemide. 01/17/18    Masoudi, Dorthula Rue, MD  pramipexole (MIRAPEX) 1 MG tablet Take 1 mg by mouth 2 (two) times daily.    [provider]  Suvorexant (BELSOMRA) 20 MG TABS Take 1 capsule by mouth at bedtime.    [provider]  Tiotropium Bromide-Olodaterol (STIOLTO RESPIMAT) 2.5-2.5 MCG/ACT AERS Inhale 2 puffs into the lungs daily. 01/31/18   Tanda Rockers, MD  torsemide (DEMADEX) 20 MG tablet Take 60 mg in the morning and 40 mg in the evening daily. 02/02/18   Park Liter, MD  TRULICITY 1.5 YH/0.6CB SOPN Inject 1.5 mg into the skin every Sunday.  10/10/16   [provider]    Family History Family History  Problem Relation Age of Onset  .  Stroke Mother   . Diabetes Father   . Diabetes Sister   . Diabetes Sister   . Diabetes Son     Social History Social History   Tobacco Use  . Smoking status: Current Every Day Smoker    Packs/day: 0.75    Years: 44.00    Pack years: 33.00    Types: Cigarettes  . Smokeless tobacco: Never Used  Substance Use Topics  . Alcohol use: Yes    Alcohol/week: 7.0 standard drinks    Types: 7 Shots of liquor per week  . Drug use: Yes    Types: Marijuana    Comment: 10/23/2017 "had 1 ~ 1 month ago; nothing before or since"     Allergies   Hydroxychloroquine; Donepezil; Prednisone; Anticoagulant cit dext [acd formula a]; Bupropion; Metrizamide; Varenicline; and Tape   Review of Systems Review of Systems  Respiratory: Positive for shortness of breath.   Cardiovascular: Positive for chest pain.  All other systems reviewed and are negative.    Physical Exam Updated Vital Signs BP 115/61   Pulse 99   Temp 97.8 F (36.6 C) (Oral)   Resp 20   Ht 5\' 2"  (1.575 m)   Wt 97.5 kg   SpO2 99%   BMI 39.32 kg/m   Physical Exam Vitals signs and nursing note reviewed.  Constitutional:      General: She is not in acute distress.    Comments: Chronically ill appearing female in no acute discomfort  HENT:     Head: Atraumatic.      Comments: Poor dentition Eyes:     Conjunctiva/sclera: Conjunctivae normal.  Neck:     Musculoskeletal: Neck supple.  Cardiovascular:     Rate and Rhythm: Tachycardia present.  Pulmonary:     Breath sounds: Normal breath sounds.     Comments: Expiratory wheezes with decreased breath sounds throughout Abdominal:     Palpations: Abdomen is soft.     Comments: Small wound noted to mid upper abdomen with dressing in place, no signs of infection.  It is mildly tender to palpation.  Musculoskeletal:     Right lower leg: Edema present.     Left lower leg: Edema present.     Comments: 1+ pitting edema to bilateral lower extremities extending to mid shin.  Skin:    Findings: No rash.  Neurological:     Mental Status: She is alert.      ED Treatments / Results  Labs (all labs ordered are listed, but only abnormal results are displayed) Labs Reviewed  BASIC METABOLIC PANEL - Abnormal; Notable for the following components:      Result Value   Glucose, Bld 270 (*)    Creatinine, Ser 1.07 (*)    GFR calc non Af Amer 56 (*)    All other components within normal limits  CBC - Abnormal; Notable for the following components:   WBC 11.5 (*)    RDW 18.0 (*)    All other components within normal limits  BRAIN NATRIURETIC PEPTIDE - Abnormal; Notable for the following components:   B Natriuretic Peptide 331.0 (*)    All other components within normal limits  I-STAT TROPONIN, ED    EKG EKG Interpretation  Date/Time:  Sunday June 03 2018 11:48:01 EDT Ventricular Rate:  98 PR Interval:  134 QRS Duration: 92 QT Interval:  374 QTC Calculation: 477 R Axis:   62 Text Interpretation:  Normal sinus rhythm Biatrial enlargement Anterior infarct , age undetermined Abnormal ECG No  significant change since last tracing Confirmed by Dorie Rank [phone]) on 06/03/2018 11:51:26 AM   Radiology Dg Chest 2 View  Result Date: 06/03/2018 CLINICAL DATA:  Difficulty breathing for 3 days.  Productive cough.  EXAM: CHEST - 2 VIEW COMPARISON:  April 20, 2018 FINDINGS: The cardiomediastinal silhouette is stable. Haziness over the left base is favored represent overlapping soft tissues, perhaps with underlying atelectasis. No focal infiltrate, nodule, or mass. No other acute abnormalities. IMPRESSION: No acute abnormalities. Electronically Signed   By: Dorise Bullion III M.D   On: 06/03/2018 15:12    Procedures Procedures (including critical care time)  Medications Ordered in ED Medications  ipratropium-albuterol (DUONEB) 0.5-2.5 (3) MG/3ML nebulizer solution 3 mL (has no administration in time range)  furosemide (LASIX) injection 40 mg (has no administration in time range)  albuterol (PROVENTIL) (2.5 MG/3ML) 0.083% nebulizer solution 5 mg (5 mg Nebulization Given 06/03/18 1203)     Initial Impression / Assessment and Plan / ED Course  I have reviewed the triage vital signs and the nursing notes.  Pertinent labs & imaging results that were available during my care of the patient were reviewed by me and considered in my medical decision making (see chart for details).        BP 115/61   Pulse 99   Temp 97.8 F (36.6 C) (Oral)   Resp 20   Ht 5\' 2"  (1.575 m)   Wt 97.5 kg   SpO2 99%   BMI 39.32 kg/m    Final Clinical Impressions(s) / ED Diagnoses   Final diagnoses:  Bronchitis    ED Discharge Orders         Ordered    predniSONE (DELTASONE) 20 MG tablet     06/03/18 1613    benzonatate (TESSALON) 100 MG capsule  Every 8 hours     06/03/18 1613         3:20 PM Patient with history of COPD and CHF presenting complaining worsening shortness of breath.  She is on home oxygen 2 L.  She does have some evidence of fluid retention likely secondary to CHF.  Today, her BNP is 331, chest x-ray unremarkable EKG and troponin unremarkable and labs are reassuring. Her sxs suggestive of bronchitis.  It given one dose of Lasix 40mg  via IV.  Pt d/c home with prednisone, albuterol and cough  medication as treatment for bronchitis. Lung exam improves with duonebs.    4:12 PM O2 sats 97% when ambulate.  Pt stable for discharge.    Domenic Moras, PA-C 06/03/18 1615    Virgel Manifold, MD 06/04/18 1026

## 2018-06-11 DIAGNOSIS — R300 Dysuria: Secondary | ICD-10-CM | POA: Diagnosis not present

## 2018-06-11 DIAGNOSIS — K625 Hemorrhage of anus and rectum: Secondary | ICD-10-CM | POA: Diagnosis not present

## 2018-06-14 DIAGNOSIS — K648 Other hemorrhoids: Secondary | ICD-10-CM | POA: Diagnosis not present

## 2018-06-14 DIAGNOSIS — K625 Hemorrhage of anus and rectum: Secondary | ICD-10-CM | POA: Diagnosis not present

## 2018-06-18 ENCOUNTER — Telehealth: Payer: Self-pay | Admitting: Emergency Medicine

## 2018-06-18 NOTE — Telephone Encounter (Signed)
   Primary Cardiologist:  Jenne Campus, MD   Patient contacted.  History reviewed.  No symptoms to suggest any unstable cardiac conditions.  Based on discussion, with current pandemic situation, we will be postponing this appointment for [facility].  If symptoms change, she has been instructed to contact our office.   Routing to C19 CANCEL pool for tracking (P CV DIV CV19 CANCEL) and assigning priority (1 = 4-6 wks, 2 = 6-12 wks, 3 = >12 wks).  Ashok Norris, RN  06/18/2018 3:14 PM         .

## 2018-06-19 ENCOUNTER — Ambulatory Visit: Payer: Federal, State, Local not specified - PPO | Admitting: Cardiology

## 2018-06-25 DIAGNOSIS — J449 Chronic obstructive pulmonary disease, unspecified: Secondary | ICD-10-CM | POA: Diagnosis not present

## 2018-06-27 ENCOUNTER — Telehealth: Payer: Self-pay | Admitting: Cardiology

## 2018-06-27 DIAGNOSIS — Z9049 Acquired absence of other specified parts of digestive tract: Secondary | ICD-10-CM

## 2018-06-27 NOTE — Telephone Encounter (Signed)
Patient had called wanting to speak directly with Crystal Yates. Returned her call to gather information about her needs. States she would like Crystal. [name] to help her if at all possible  get a surgical consult for a chronic gastrointestinal wound.     Patient with history of colon resection with multiple post-operative complications including failed mesh implant, subsequent explantation and multiple recurrent post-operative infections.    She is now with abdominal seroma and hernia, was scheduled to have repaired at [facility] and surgery was cancelled due to Covid-19 pandemic.      Two weeks ago she was seen by a gasatroenterologist 2 weeks ago who recommended that something should be done regarding the seroma which has stretched the skin very thin and seems close to rupturing.     Patient is hoping that Crystal. [name] can somehow help her with a referral to a [facility] surgeon or in any other way.  Pls adivse

## 2018-06-27 NOTE — Telephone Encounter (Signed)
She if I will be able to help her.  I think the easiest way to approach this is to simply do referral to surgery in [place] should be able to talk to her over the phone and try to find out was the urgency of this procedure is.

## 2018-06-27 NOTE — Telephone Encounter (Signed)
Just let me know if I need to follow-up in any way.

## 2018-06-29 NOTE — Addendum Note (Signed)
Addended by: Austin Miles on: 06/29/2018 02:57 PM   Modules accepted: Orders

## 2018-06-29 NOTE — Telephone Encounter (Signed)
Crystal Yates calling back about referral to see if it has been done. Please advise.

## 2018-06-29 NOTE — Telephone Encounter (Signed)
Dr. Raliegh Ip, would you like me to set up a call so you can speak directly with Judeen Hammans?

## 2018-06-29 NOTE — Telephone Encounter (Signed)
Informed patient that referral was placed to [facility] for evaluation of abdominal wound and that she should be contacted by someone in that department within approximately 1 week. Instructed to let our office know if she doesn't hear from someone in that time frame. Patient veralizes understanding of above.

## 2018-06-29 NOTE — Telephone Encounter (Signed)
Can we schedule Crystal Yates to see surgeons from [facility]?

## 2018-07-09 ENCOUNTER — Telehealth: Payer: Self-pay | Admitting: Internal Medicine

## 2018-07-09 DIAGNOSIS — J449 Chronic obstructive pulmonary disease, unspecified: Secondary | ICD-10-CM

## 2018-07-09 MED ORDER — AZITHROMYCIN 250 MG PO TABS: 250.0000 mg | ORAL_TABLET | ORAL | 0 refills | Status: DC

## 2018-07-09 MED ORDER — METHYLPREDNISOLONE 8 MG PO TABS: 8.0000 mg | ORAL_TABLET | Freq: Every day | ORAL | 0 refills | Status: DC

## 2018-07-09 NOTE — Telephone Encounter (Signed)
Sorry to hear that:  Can try zpak and medrol 8mg    take  4 each am x 2 days,   2 each am x 2 days,  1 each am x 2 days and stop but if not responding needs to go to ER at [facility] which is set up to answer questions re COVID-19 activity and may be admitted there as a rule out until test back or treated at home  (aware of prednisone "allergy" = poor tolerance)

## 2018-07-09 NOTE — Telephone Encounter (Signed)
Spoke with the pt and notified of recs per MW  She verbalized understanding  Rxs sent to [facility]

## 2018-07-09 NOTE — Telephone Encounter (Signed)
Primary Pulmonologist: Dr. Christinia Gully Last office visit and with whom: 05/21/2018 What do we see them for (pulmonary problems): COPD GOLD III  Reason for call: Pt states has SOB w/ exertion, wheezing, cough w/ yellow-white mucus, running low-grade fever 99.4, diarrhea for the last 2 days. States she has difficulty doing deep breathing techniques. Normally on 2L O2 all the time but has had to increase to 3L.Pt states she is using rescue inhaler more often due to symptoms. She is also taking Stiolto and ipratropium, but states all her inhalers and breathing treatment are non-therapeutic.   In the last month, have you been in contact with someone who was confirmed or suspected to have Conoravirus / COVID-19?  No  Do you have any of the following symptoms developed in the last 30 days? Fever: Low-grade 99.4* Cough: Yes Shortness of breath: Yes  When did your symptoms start?  2 days ago   If the patient has a fever, what is the last reading?  (use n/a if patient denies fever)  99.4*F . IF THE PATIENT STATES THEY DO NOT OWN A THERMOMETER, THEY MUST GO AND PURCHASE ONE When did the fever start?: in the morning 07/09/2018 Have you taken any medication to suppress a fever (ie Ibuprofen, Aleve, Tylenol)?: Tylenol  Dr. Melvyn Novas, please advise. Thank you.

## 2018-07-10 ENCOUNTER — Other Ambulatory Visit: Payer: Self-pay

## 2018-07-10 ENCOUNTER — Emergency Department (HOSPITAL_COMMUNITY): Payer: Federal, State, Local not specified - PPO

## 2018-07-10 ENCOUNTER — Encounter (HOSPITAL_COMMUNITY): Payer: Self-pay | Admitting: *Deleted

## 2018-07-10 ENCOUNTER — Observation Stay (HOSPITAL_COMMUNITY)
Admission: EM | Admit: 2018-07-10 | Discharge: 2018-07-11 | Disposition: A | Discharge status: Home / Self Care | Payer: Federal, State, Local not specified - PPO | Attending: Family Medicine | Admitting: Family Medicine

## 2018-07-10 DIAGNOSIS — F1290 Cannabis use, unspecified, uncomplicated: Secondary | ICD-10-CM | POA: Insufficient documentation

## 2018-07-10 DIAGNOSIS — E063 Autoimmune thyroiditis: Secondary | ICD-10-CM | POA: Insufficient documentation

## 2018-07-10 DIAGNOSIS — E785 Hyperlipidemia, unspecified: Secondary | ICD-10-CM | POA: Diagnosis present

## 2018-07-10 DIAGNOSIS — E1129 Type 2 diabetes mellitus with other diabetic kidney complication: Secondary | ICD-10-CM | POA: Diagnosis present

## 2018-07-10 DIAGNOSIS — F17210 Nicotine dependence, cigarettes, uncomplicated: Secondary | ICD-10-CM | POA: Diagnosis not present

## 2018-07-10 DIAGNOSIS — R10819 Abdominal tenderness, unspecified site: Secondary | ICD-10-CM | POA: Diagnosis not present

## 2018-07-10 DIAGNOSIS — Z7902 Long term (current) use of antithrombotics/antiplatelets: Secondary | ICD-10-CM | POA: Insufficient documentation

## 2018-07-10 DIAGNOSIS — J9622 Acute and chronic respiratory failure with hypercapnia: Secondary | ICD-10-CM | POA: Insufficient documentation

## 2018-07-10 DIAGNOSIS — I252 Old myocardial infarction: Secondary | ICD-10-CM | POA: Insufficient documentation

## 2018-07-10 DIAGNOSIS — F1090 Alcohol use, unspecified, uncomplicated: Secondary | ICD-10-CM

## 2018-07-10 DIAGNOSIS — Z20828 Contact with and (suspected) exposure to other viral communicable diseases: Secondary | ICD-10-CM | POA: Diagnosis not present

## 2018-07-10 DIAGNOSIS — R0602 Shortness of breath: Secondary | ICD-10-CM | POA: Diagnosis not present

## 2018-07-10 DIAGNOSIS — Z823 Family history of stroke: Secondary | ICD-10-CM | POA: Insufficient documentation

## 2018-07-10 DIAGNOSIS — Z79899 Other long term (current) drug therapy: Secondary | ICD-10-CM | POA: Diagnosis not present

## 2018-07-10 DIAGNOSIS — E11649 Type 2 diabetes mellitus with hypoglycemia without coma: Secondary | ICD-10-CM | POA: Insufficient documentation

## 2018-07-10 DIAGNOSIS — Z03818 Encounter for observation for suspected exposure to other biological agents ruled out: Secondary | ICD-10-CM | POA: Diagnosis not present

## 2018-07-10 DIAGNOSIS — I2510 Atherosclerotic heart disease of native coronary artery without angina pectoris: Secondary | ICD-10-CM | POA: Diagnosis not present

## 2018-07-10 DIAGNOSIS — Z794 Long term (current) use of insulin: Secondary | ICD-10-CM | POA: Insufficient documentation

## 2018-07-10 DIAGNOSIS — J441 Chronic obstructive pulmonary disease with (acute) exacerbation: Secondary | ICD-10-CM | POA: Diagnosis present

## 2018-07-10 DIAGNOSIS — I5032 Chronic diastolic (congestive) heart failure: Secondary | ICD-10-CM | POA: Insufficient documentation

## 2018-07-10 DIAGNOSIS — Z789 Other specified health status: Secondary | ICD-10-CM

## 2018-07-10 DIAGNOSIS — R109 Unspecified abdominal pain: Secondary | ICD-10-CM | POA: Diagnosis present

## 2018-07-10 DIAGNOSIS — Z716 Tobacco abuse counseling: Secondary | ICD-10-CM | POA: Insufficient documentation

## 2018-07-10 DIAGNOSIS — Z20822 Contact with and (suspected) exposure to covid-19: Secondary | ICD-10-CM | POA: Diagnosis present

## 2018-07-10 DIAGNOSIS — E875 Hyperkalemia: Secondary | ICD-10-CM

## 2018-07-10 DIAGNOSIS — Z888 Allergy status to other drugs, medicaments and biological substances status: Secondary | ICD-10-CM | POA: Insufficient documentation

## 2018-07-10 DIAGNOSIS — Z7289 Other problems related to lifestyle: Secondary | ICD-10-CM | POA: Insufficient documentation

## 2018-07-10 DIAGNOSIS — R6889 Other general symptoms and signs: Secondary | ICD-10-CM

## 2018-07-10 DIAGNOSIS — E2740 Unspecified adrenocortical insufficiency: Secondary | ICD-10-CM | POA: Diagnosis not present

## 2018-07-10 DIAGNOSIS — Z9981 Dependence on supplemental oxygen: Secondary | ICD-10-CM | POA: Diagnosis not present

## 2018-07-10 DIAGNOSIS — R1084 Generalized abdominal pain: Secondary | ICD-10-CM | POA: Diagnosis not present

## 2018-07-10 DIAGNOSIS — Z833 Family history of diabetes mellitus: Secondary | ICD-10-CM | POA: Insufficient documentation

## 2018-07-10 DIAGNOSIS — E1122 Type 2 diabetes mellitus with diabetic chronic kidney disease: Secondary | ICD-10-CM | POA: Diagnosis not present

## 2018-07-10 DIAGNOSIS — E039 Hypothyroidism, unspecified: Secondary | ICD-10-CM | POA: Diagnosis present

## 2018-07-10 DIAGNOSIS — Z955 Presence of coronary angioplasty implant and graft: Secondary | ICD-10-CM | POA: Insufficient documentation

## 2018-07-10 DIAGNOSIS — Z79890 Hormone replacement therapy (postmenopausal): Secondary | ICD-10-CM | POA: Insufficient documentation

## 2018-07-10 DIAGNOSIS — I130 Hypertensive heart and chronic kidney disease with heart failure and stage 1 through stage 4 chronic kidney disease, or unspecified chronic kidney disease: Secondary | ICD-10-CM | POA: Diagnosis not present

## 2018-07-10 DIAGNOSIS — F339 Major depressive disorder, recurrent, unspecified: Secondary | ICD-10-CM | POA: Diagnosis present

## 2018-07-10 DIAGNOSIS — J9621 Acute and chronic respiratory failure with hypoxia: Principal | ICD-10-CM | POA: Diagnosis present

## 2018-07-10 DIAGNOSIS — E162 Hypoglycemia, unspecified: Secondary | ICD-10-CM | POA: Diagnosis present

## 2018-07-10 DIAGNOSIS — I5042 Chronic combined systolic (congestive) and diastolic (congestive) heart failure: Secondary | ICD-10-CM | POA: Diagnosis present

## 2018-07-10 DIAGNOSIS — N182 Chronic kidney disease, stage 2 (mild): Secondary | ICD-10-CM

## 2018-07-10 DIAGNOSIS — R918 Other nonspecific abnormal finding of lung field: Secondary | ICD-10-CM | POA: Insufficient documentation

## 2018-07-10 DIAGNOSIS — N183 Chronic kidney disease, stage 3 unspecified: Secondary | ICD-10-CM | POA: Diagnosis present

## 2018-07-10 DIAGNOSIS — I4581 Long QT syndrome: Secondary | ICD-10-CM | POA: Diagnosis not present

## 2018-07-10 LAB — RESPIRATORY PANEL BY PCR

## 2018-07-10 LAB — CBC WITH DIFFERENTIAL/PLATELET
Abs Immature Granulocytes: 0.06 10*3/uL (ref 0.00–0.07)
Basophils Absolute: 0 10*3/uL (ref 0.0–0.1)
Basophils Relative: 0 %
Eosinophils Absolute: 0.3 10*3/uL (ref 0.0–0.5)
Eosinophils Relative: 2 %
HCT: 39.4 % (ref 36.0–46.0)
Hemoglobin: 12.3 g/dL (ref 12.0–15.0)
Immature Granulocytes: 1 %
Lymphocytes Relative: 18 %
Lymphs Abs: 2 10*3/uL (ref 0.7–4.0)
MCH: 29.9 pg (ref 26.0–34.0)
MCHC: 31.2 g/dL (ref 30.0–36.0)
MCV: 95.6 fL (ref 80.0–100.0)
Monocytes Absolute: 0.7 10*3/uL (ref 0.1–1.0)
Monocytes Relative: 6 %
Neutro Abs: 8.1 10*3/uL — ABNORMAL HIGH (ref 1.7–7.7)
Neutrophils Relative %: 73 %
Platelets: 204 10*3/uL (ref 150–400)
RBC: 4.12 MIL/uL (ref 3.87–5.11)
RDW: 15.6 % — ABNORMAL HIGH (ref 11.5–15.5)
WBC: 11.2 10*3/uL — ABNORMAL HIGH (ref 4.0–10.5)
nRBC: 0 % (ref 0.0–0.2)

## 2018-07-10 LAB — COMPREHENSIVE METABOLIC PANEL
ALT: 27 U/L (ref 0–44)
AST: 57 U/L — ABNORMAL HIGH (ref 15–41)
Albumin: 3 g/dL — ABNORMAL LOW (ref 3.5–5.0)
Alkaline Phosphatase: 99 U/L (ref 38–126)
Anion gap: 6 (ref 5–15)
BUN: 15 mg/dL (ref 8–23)
CO2: 29 mmol/L (ref 22–32)
Calcium: 9.3 mg/dL (ref 8.9–10.3)
Chloride: 103 mmol/L (ref 98–111)
Creatinine, Ser: 1.15 mg/dL — ABNORMAL HIGH (ref 0.44–1.00)
GFR calc Af Amer: 59 mL/min — ABNORMAL LOW (ref >60)
GFR calc non Af Amer: 51 mL/min — ABNORMAL LOW (ref >60)
Glucose, Bld: 58 mg/dL — ABNORMAL LOW (ref 70–99)
Potassium: 6.2 mmol/L — ABNORMAL HIGH (ref 3.5–5.1)
Sodium: 138 mmol/L (ref 135–145)
Total Bilirubin: 1.3 mg/dL — ABNORMAL HIGH (ref 0.3–1.2)
Total Protein: 6.8 g/dL (ref 6.5–8.1)

## 2018-07-10 LAB — C-REACTIVE PROTEIN: CRP: <0.8 mg/dL (ref <1.0)

## 2018-07-10 LAB — CBG MONITORING, ED: Glucose-Capillary: 77 mg/dL (ref 70–99)

## 2018-07-10 LAB — URINALYSIS, ROUTINE W REFLEX MICROSCOPIC
Bilirubin Urine: NEGATIVE
Glucose, UA: NEGATIVE mg/dL
Hgb urine dipstick: NEGATIVE
Ketones, ur: NEGATIVE mg/dL
Nitrite: NEGATIVE
Protein, ur: NEGATIVE mg/dL
Specific Gravity, Urine: 1.015 (ref 1.005–1.030)
pH: 5 (ref 5.0–8.0)

## 2018-07-10 LAB — GLUCOSE, CAPILLARY
Glucose-Capillary: 176 mg/dL — ABNORMAL HIGH (ref 70–99)
Glucose-Capillary: 222 mg/dL — ABNORMAL HIGH (ref 70–99)
Glucose-Capillary: 220 mg/dL — ABNORMAL HIGH (ref 70–99)
Glucose-Capillary: 322 mg/dL — ABNORMAL HIGH (ref 70–99)

## 2018-07-10 LAB — INFLUENZA PANEL BY PCR (TYPE A & B)
Influenza A By PCR: NEGATIVE
Influenza B By PCR: NEGATIVE

## 2018-07-10 LAB — LACTATE DEHYDROGENASE: LDH: 142 U/L (ref 98–192)

## 2018-07-10 LAB — POTASSIUM: Potassium: 4.2 mmol/L (ref 3.5–5.1)

## 2018-07-10 LAB — BRAIN NATRIURETIC PEPTIDE: B Natriuretic Peptide: 202.8 pg/mL — ABNORMAL HIGH (ref 0.0–100.0)

## 2018-07-10 LAB — HIV ANTIBODY (ROUTINE TESTING W REFLEX): HIV Screen 4th Generation wRfx: NONREACTIVE

## 2018-07-10 LAB — LIPASE, BLOOD: Lipase: 29 U/L (ref 11–51)

## 2018-07-10 LAB — SARS CORONAVIRUS 2 BY RT PCR (HOSPITAL ORDER, PERFORMED IN ~~LOC~~ HOSPITAL LAB): SARS Coronavirus 2: NEGATIVE

## 2018-07-10 MED ORDER — PANTOPRAZOLE SODIUM 40 MG PO TBEC
40.0000 mg | DELAYED_RELEASE_TABLET | Freq: Every day | ORAL | Status: DC
  Administered 2018-07-10 – 2018-07-11 (×2): 40 mg via ORAL
  Filled 2018-07-10 (×2): qty 1

## 2018-07-10 MED ORDER — LORAZEPAM 1 MG PO TABS: 1.0000 mg | ORAL_TABLET | Freq: Four times a day (QID) | ORAL | Status: DC | PRN

## 2018-07-10 MED ORDER — ORAL CARE MOUTH RINSE
15.0000 mL | Freq: Two times a day (BID) | OROMUCOSAL | Status: DC
  Administered 2018-07-10 – 2018-07-11 (×2): 15 mL via OROMUCOSAL

## 2018-07-10 MED ORDER — VITAMIN D3 25 MCG (1000 UNIT) PO TABS
2000.0000 [IU] | ORAL_TABLET | Freq: Every day | ORAL | Status: DC
  Administered 2018-07-10 – 2018-07-11 (×2): 2000 [IU] via ORAL
  Filled 2018-07-10 (×2): qty 2

## 2018-07-10 MED ORDER — ADULT MULTIVITAMIN W/MINERALS CH
1.0000 | ORAL_TABLET | Freq: Every day | ORAL | Status: DC
  Administered 2018-07-10 – 2018-07-11 (×2): 1 via ORAL
  Filled 2018-07-10 (×2): qty 1

## 2018-07-10 MED ORDER — HYDROCORTISONE 2.5 % RE CREA: 1.0000 "application " | TOPICAL_CREAM | Freq: Every day | RECTAL | Status: DC | PRN

## 2018-07-10 MED ORDER — HYDROCORTISONE 5 MG PO TABS: 5.0000 mg | ORAL_TABLET | ORAL | Status: DC

## 2018-07-10 MED ORDER — ALBUTEROL SULFATE (2.5 MG/3ML) 0.083% IN NEBU
5.0000 mg | INHALATION_SOLUTION | Freq: Once | RESPIRATORY_TRACT | Status: AC
  Administered 2018-07-10: 5 mg via RESPIRATORY_TRACT
  Filled 2018-07-10: qty 6

## 2018-07-10 MED ORDER — IPRATROPIUM BROMIDE HFA 17 MCG/ACT IN AERS
2.0000 | INHALATION_SPRAY | Freq: Every day | RESPIRATORY_TRACT | Status: DC
  Administered 2018-07-10 – 2018-07-11 (×5): 2 via RESPIRATORY_TRACT
  Filled 2018-07-10: qty 12.9

## 2018-07-10 MED ORDER — INSULIN ASPART 100 UNIT/ML ~~LOC~~ SOLN
0.0000 [IU] | Freq: Every day | SUBCUTANEOUS | Status: DC
  Administered 2018-07-10: 4 [IU] via SUBCUTANEOUS

## 2018-07-10 MED ORDER — CALCIUM CARBONATE 1250 (500 CA) MG PO TABS
ORAL_TABLET | Freq: Every day | ORAL | Status: DC
  Administered 2018-07-10: 1250 mg via ORAL
  Filled 2018-07-10 (×3): qty 1

## 2018-07-10 MED ORDER — FOLIC ACID 1 MG PO TABS
1.0000 mg | ORAL_TABLET | Freq: Every day | ORAL | Status: DC
  Administered 2018-07-10 – 2018-07-11 (×2): 1 mg via ORAL
  Filled 2018-07-10 (×2): qty 1

## 2018-07-10 MED ORDER — ONDANSETRON HCL 4 MG/2ML IJ SOLN: 4.0000 mg | Freq: Three times a day (TID) | INTRAMUSCULAR | Status: DC | PRN

## 2018-07-10 MED ORDER — FUROSEMIDE 40 MG PO TABS
60.0000 mg | ORAL_TABLET | Freq: Every day | ORAL | Status: DC
  Administered 2018-07-10: 60 mg via ORAL
  Filled 2018-07-10: qty 2

## 2018-07-10 MED ORDER — ATORVASTATIN CALCIUM 20 MG PO TABS
20.0000 mg | ORAL_TABLET | Freq: Every evening | ORAL | Status: DC
  Administered 2018-07-10: 20 mg via ORAL
  Filled 2018-07-10: qty 1

## 2018-07-10 MED ORDER — ALBUTEROL (5 MG/ML) CONTINUOUS INHALATION SOLN
10.0000 mg/h | INHALATION_SOLUTION | RESPIRATORY_TRACT | Status: DC
  Administered 2018-07-10: 10 mg/h via RESPIRATORY_TRACT
  Filled 2018-07-10: qty 20

## 2018-07-10 MED ORDER — ORAL CARE MOUTH RINSE: 15.0000 mL | Freq: Two times a day (BID) | OROMUCOSAL | Status: DC

## 2018-07-10 MED ORDER — IPRATROPIUM BROMIDE HFA 17 MCG/ACT IN AERS: 2.0000 | INHALATION_SPRAY | RESPIRATORY_TRACT | Status: DC

## 2018-07-10 MED ORDER — METHYLPREDNISOLONE SODIUM SUCC 125 MG IJ SOLR
60.0000 mg | Freq: Three times a day (TID) | INTRAMUSCULAR | Status: DC
  Administered 2018-07-10 – 2018-07-11 (×5): 60 mg via INTRAVENOUS
  Filled 2018-07-10 (×5): qty 2

## 2018-07-10 MED ORDER — DEXTROSE 50 % IV SOLN: 50.0000 mL | INTRAVENOUS | Status: DC | PRN

## 2018-07-10 MED ORDER — VITAMIN B-1 100 MG PO TABS
100.0000 mg | ORAL_TABLET | Freq: Every day | ORAL | Status: DC
  Administered 2018-07-10 – 2018-07-11 (×2): 100 mg via ORAL
  Filled 2018-07-10 (×2): qty 1

## 2018-07-10 MED ORDER — LORAZEPAM 2 MG/ML IJ SOLN: 0.0000 mg | Freq: Two times a day (BID) | INTRAMUSCULAR | Status: DC

## 2018-07-10 MED ORDER — AZITHROMYCIN 250 MG PO TABS
250.0000 mg | ORAL_TABLET | Freq: Every day | ORAL | Status: DC
  Administered 2018-07-10 – 2018-07-11 (×2): 250 mg via ORAL
  Filled 2018-07-10 (×2): qty 1

## 2018-07-10 MED ORDER — ALBUTEROL SULFATE HFA 108 (90 BASE) MCG/ACT IN AERS
2.0000 | INHALATION_SPRAY | RESPIRATORY_TRACT | Status: DC | PRN
  Administered 2018-07-11: 2 via RESPIRATORY_TRACT
  Filled 2018-07-10: qty 6.7

## 2018-07-10 MED ORDER — METHYLPREDNISOLONE SODIUM SUCC 125 MG IJ SOLR
125.0000 mg | Freq: Once | INTRAMUSCULAR | Status: AC
  Administered 2018-07-10: 125 mg via INTRAVENOUS
  Filled 2018-07-10: qty 2

## 2018-07-10 MED ORDER — FUROSEMIDE 10 MG/ML IJ SOLN
40.0000 mg | Freq: Two times a day (BID) | INTRAMUSCULAR | Status: DC
  Administered 2018-07-10 – 2018-07-11 (×2): 40 mg via INTRAVENOUS
  Filled 2018-07-10 (×2): qty 4

## 2018-07-10 MED ORDER — FUSION PLUS PO CAPS: 1.0000 | ORAL_CAPSULE | Freq: Every day | ORAL | Status: DC

## 2018-07-10 MED ORDER — HYDROCORTISONE 5 MG PO TABS: 5.0000 mg | ORAL_TABLET | Freq: Every day | ORAL | Status: DC

## 2018-07-10 MED ORDER — LORAZEPAM 2 MG/ML IJ SOLN: 0.0000 mg | Freq: Four times a day (QID) | INTRAMUSCULAR | Status: DC

## 2018-07-10 MED ORDER — LEVOTHYROXINE SODIUM 150 MCG PO TABS
150.0000 ug | ORAL_TABLET | Freq: Every day | ORAL | Status: DC
  Administered 2018-07-10 – 2018-07-11 (×2): 150 ug via ORAL
  Filled 2018-07-10: qty 3
  Filled 2018-07-10 (×2): qty 1

## 2018-07-10 MED ORDER — MORPHINE SULFATE (PF) 4 MG/ML IV SOLN
6.0000 mg | Freq: Once | INTRAVENOUS | Status: AC
  Administered 2018-07-10: 6 mg via INTRAVENOUS
  Filled 2018-07-10: qty 2

## 2018-07-10 MED ORDER — CALCIUM CARBONATE 1250 (500 CA) MG PO TABS
1.0000 | ORAL_TABLET | Freq: Every day | ORAL | Status: DC
  Administered 2018-07-11: 500 mg via ORAL
  Filled 2018-07-10: qty 1

## 2018-07-10 MED ORDER — DM-GUAIFENESIN ER 30-600 MG PO TB12: 1.0000 | ORAL_TABLET | Freq: Two times a day (BID) | ORAL | Status: DC | PRN

## 2018-07-10 MED ORDER — DULOXETINE HCL 30 MG PO CPEP
60.0000 mg | ORAL_CAPSULE | Freq: Every day | ORAL | Status: DC
  Administered 2018-07-10 – 2018-07-11 (×2): 60 mg via ORAL
  Filled 2018-07-10 (×2): qty 2

## 2018-07-10 MED ORDER — CLOPIDOGREL BISULFATE 75 MG PO TABS
75.0000 mg | ORAL_TABLET | Freq: Every day | ORAL | Status: DC
  Administered 2018-07-10: 75 mg via ORAL
  Filled 2018-07-10: qty 1

## 2018-07-10 MED ORDER — LORAZEPAM 2 MG/ML IJ SOLN
0.0000 mg | Freq: Four times a day (QID) | INTRAMUSCULAR | Status: DC
  Administered 2018-07-10: 1 mg via INTRAVENOUS
  Filled 2018-07-10: qty 1

## 2018-07-10 MED ORDER — MAGNESIUM OXIDE 400 (241.3 MG) MG PO TABS
400.0000 mg | ORAL_TABLET | Freq: Every day | ORAL | Status: DC
  Administered 2018-07-10 – 2018-07-11 (×2): 400 mg via ORAL
  Filled 2018-07-10 (×2): qty 1

## 2018-07-10 MED ORDER — ACETAMINOPHEN 500 MG PO TABS: 500.0000 mg | ORAL_TABLET | Freq: Four times a day (QID) | ORAL | Status: DC | PRN

## 2018-07-10 MED ORDER — IOHEXOL 300 MG/ML  SOLN
30.0000 mL | Freq: Once | INTRAMUSCULAR | Status: AC | PRN
  Administered 2018-07-11: 30 mL via ORAL

## 2018-07-10 MED ORDER — IPRATROPIUM BROMIDE 0.02 % IN SOLN
0.5000 mg | Freq: Once | RESPIRATORY_TRACT | Status: AC
  Administered 2018-07-10: 0.5 mg via RESPIRATORY_TRACT
  Filled 2018-07-10: qty 2.5

## 2018-07-10 MED ORDER — NICOTINE 21 MG/24HR TD PT24
21.0000 mg | MEDICATED_PATCH | Freq: Every day | TRANSDERMAL | Status: DC
  Administered 2018-07-10: 21 mg via TRANSDERMAL
  Filled 2018-07-10 (×2): qty 1

## 2018-07-10 MED ORDER — NITROGLYCERIN 0.4 MG SL SUBL: 0.4000 mg | SUBLINGUAL_TABLET | SUBLINGUAL | Status: DC | PRN

## 2018-07-10 MED ORDER — IPRATROPIUM BROMIDE HFA 17 MCG/ACT IN AERS
2.0000 | INHALATION_SPRAY | RESPIRATORY_TRACT | Status: DC
  Administered 2018-07-10 (×2): 2 via RESPIRATORY_TRACT
  Filled 2018-07-10: qty 12.9

## 2018-07-10 MED ORDER — ENOXAPARIN SODIUM 40 MG/0.4ML ~~LOC~~ SOLN
40.0000 mg | SUBCUTANEOUS | Status: DC
  Administered 2018-07-10 – 2018-07-11 (×2): 40 mg via SUBCUTANEOUS
  Filled 2018-07-10 (×2): qty 0.4

## 2018-07-10 MED ORDER — SUVOREXANT 20 MG PO TABS: 20.0000 mg | ORAL_TABLET | Freq: Every day | ORAL | Status: DC

## 2018-07-10 MED ORDER — THIAMINE HCL 100 MG/ML IJ SOLN: 100.0000 mg | Freq: Every day | INTRAMUSCULAR | Status: DC

## 2018-07-10 MED ORDER — LORAZEPAM 2 MG/ML IJ SOLN: 1.0000 mg | Freq: Four times a day (QID) | INTRAMUSCULAR | Status: DC | PRN

## 2018-07-10 MED ORDER — HYDRALAZINE HCL 20 MG/ML IJ SOLN: 5.0000 mg | INTRAMUSCULAR | Status: DC | PRN

## 2018-07-10 MED ORDER — MORPHINE SULFATE (PF) 2 MG/ML IV SOLN: 0.5000 mg | INTRAVENOUS | Status: DC | PRN

## 2018-07-10 MED ORDER — INSULIN ASPART 100 UNIT/ML ~~LOC~~ SOLN
0.0000 [IU] | Freq: Three times a day (TID) | SUBCUTANEOUS | Status: DC
  Administered 2018-07-10 (×2): 3 [IU] via SUBCUTANEOUS
  Administered 2018-07-10: 2 [IU] via SUBCUTANEOUS
  Administered 2018-07-11: 9 [IU] via SUBCUTANEOUS
  Administered 2018-07-11: 3 [IU] via SUBCUTANEOUS

## 2018-07-10 MED ORDER — HYDROCORTISONE 5 MG PO TABS
15.0000 mg | ORAL_TABLET | Freq: Every day | ORAL | Status: DC
  Administered 2018-07-10: 15 mg via ORAL
  Filled 2018-07-10: qty 1

## 2018-07-10 NOTE — ED Notes (Signed)
purwick in place for urine

## 2018-07-10 NOTE — ED Notes (Signed)
Called rt for continuous neb in route

## 2018-07-10 NOTE — Progress Notes (Signed)
Progress note: This is a no charge note as patient admitted earlier today, by Dr. Dwana Melena, note, imaging, labs were reviewed, as well chart and previous history was reviewed, patient presents with CV history ablation, complaints of shortness of breath, he is on IV steroids, concern for COVID-19 given her dyspnea, but COVID came back negative, discussed with ID, can discontinue precautions, appears some volume overload, will start on IV Lasix, on my exam today, abdominal pain has almost resolved, no indication for CT imaging, patient is hungry and wants to eat, will DC CT abdomen pelvis will start oral diet and continue to monitor clinically -Patient sleeping comfortably, wakes up and answer questions appropriately -Had some wheezing bilaterally -Abdomen soft, nontender, bowel sounds present  Crystal Climes MD

## 2018-07-10 NOTE — ED Notes (Signed)
ED TO INPATIENT HANDOFF REPORT  ED Nurse Name and Phone #: [phone] [address] Name/Age/Gender Crystal Yates 64 y.o. female Room/Bed: WA12/WA12  Code Status   Code Status: Full Code  Home/SNF/Other Home Patient oriented to: situation Is this baseline? Yes   Triage Complete: Triage complete  Chief Complaint shortness of breath  Triage Note Pt c/o "increased SOB, received erythromycin from Dr. Melvyn Novas.  He told me if I didn't get better I should come to the hospital.  I told him I had diarrhea a little bit yesterday.  I have belly pain where I have the mesh and maybe that's part of the problem."   Allergies Allergies  Allergen Reactions  . Hydroxychloroquine Shortness Of Breath, Nausea Only and Other (See Comments)    Dizziness  . Donepezil Other (See Comments)    Dizziness, depression, and makes the patient feel "funny"  . Prednisone Other (See Comments)    Causes depression and suicidal thoughts  . Anticoagulant Cit Dext [Acd Formula A] Other (See Comments)    Unknown  . Bupropion Other (See Comments)    Suicidal thoughts  . Metrizamide Other (See Comments)    (a non-ionic radiopaque contrast agent) "Blows the vein" and contrast gathers at the injected site's limb  . Varenicline Other (See Comments)    Suicidal thoughts  . Tape Rash and Other (See Comments)    Paper tape is preferred, PLEASE    Level of Care/Admitting Diagnosis ED Disposition    ED Disposition Condition [facility]: [place] [[id number]]  Level of Care: Telemetry [5]  Admit to tele based on following criteria: Other see comments  Comments: SOB  Diagnosis: COPD exacerbation [facility]) [[id number]]  Admitting Physician: Ivor Costa [[id number]]  Attending Physician: Ivor Costa [phone]  Estimated length of stay: past midnight tomorrow  Certification:: I certify this patient will need inpatient services for at least 2 midnights  Bed request comments: COVID19 rule out,  low risk  PT Class (Do Not Modify): Inpatient [101]  PT Acc Code (Do Not Modify): Private [1]       B Medical/Surgery History Past Medical History:  Diagnosis Date  . Acute on chronic systolic heart failure exacerbation(HCC) 04/08/2016  . Arthritis   . CAD in native artery    a. Prior LAD stenting based on cath. b. RCA stenting 03/2016 x2.  . Chronic combined systolic and diastolic CHF (congestive heart failure) ([place])   . CKD (chronic kidney disease), stage II   . COPD (chronic obstructive pulmonary disease) ([place])   . Diabetes mellitus without complication ([place])   . Dyspnea   . Hashimoto's thyroiditis   . Hyperlipidemia   . Hypertension   . Myocardial infarction ([place])   . On home oxygen therapy    "2L; 24/7" (10/23/2017)  . Secondary adrenal insufficiency ([place])   . Thyroid disease   . Tobacco abuse    Past Surgical History:  Procedure Laterality Date  . ABDOMINAL SURGERY    . CESAREAN SECTION    . CHOLECYSTECTOMY    . COLON RESECTION    . COLONOSCOPY WITH PROPOFOL N/A 01/16/2018   Procedure: COLONOSCOPY WITH PROPOFOL;  Surgeon: Rush Landmark Telford Nab., MD;  Location: [place];  Service: Gastroenterology;  Laterality: N/A;  . CORONARY BALLOON ANGIOPLASTY N/A 10/13/2017   Procedure: CORONARY BALLOON ANGIOPLASTY;  Surgeon: Belva Crome, MD;  Location: [place] [facility];  Service: Cardiovascular;  Laterality: N/A;  . HERNIA MESH REMOVAL    .  HERNIA REPAIR    . LEFT HEART CATH AND CORONARY ANGIOGRAPHY N/A 10/13/2016   Procedure: Left Heart Cath and Coronary Angiography;  Surgeon: Nelva Bush, MD;  Location: [place] [facility];  Service: Cardiovascular;  Laterality: N/A;  . POLYPECTOMY  01/16/2018   Procedure: POLYPECTOMY;  Surgeon: Rush Landmark Telford Nab., MD;  Location: [place];  Service: Gastroenterology;;  . RIGHT/LEFT HEART CATH AND CORONARY ANGIOGRAPHY N/A 10/13/2017   Procedure: RIGHT/LEFT HEART CATH AND CORONARY ANGIOGRAPHY;  Surgeon: Belva Crome, MD;   Location: [place] [facility];  Service: Cardiovascular;  Laterality: N/A;  . SHOULDER ARTHROSCOPY    . TUBAL LIGATION       A IV Location/Drains/Wounds Patient Lines/Drains/Airways Status   Active Line/Drains/Airways    Name:   Placement date:   Placement time:   Site:   Days:   Peripheral IV 06/03/18 Left Antecubital   06/03/18    1636    Antecubital   37   Peripheral IV 07/10/18 Left Antecubital   07/10/18    0545    Antecubital   less than 1   External Urinary Catheter   06/03/18    1415    -   37   Wound / Incision (Open or Dehisced) 12/24/17 Other (Comment) Buttocks Right Stage II   12/24/17    1651    Buttocks   198   Wound / Incision (Open or Dehisced) 12/24/17 Other (Comment) Abdomen Anterior post surgical bowel resection    12/24/17    1656    Abdomen   198          Intake/Output Last 24 hours No intake or output data in the 24 hours ending 07/10/18 0748  Labs/Imaging Results for orders placed or performed during the hospital encounter of 07/10/18 (from the past 48 hour(s))  CBC with Differential     Status: Abnormal   Collection Time: 07/10/18  2:06 AM  Result Value Ref Range   WBC 11.2 (H) 4.0 - 10.5 K/uL   RBC 4.12 3.87 - 5.11 MIL/uL   Hemoglobin 12.3 12.0 - 15.0 g/dL   HCT 39.4 36.0 - 46.0 %   MCV 95.6 80.0 - 100.0 fL   MCH 29.9 26.0 - 34.0 pg   MCHC 31.2 30.0 - 36.0 g/dL   RDW 15.6 (H) 11.5 - 15.5 %   Platelets 204 150 - 400 K/uL   nRBC 0.0 0.0 - 0.2 %   Neutrophils Relative % 73 %   Neutro Abs 8.1 (H) 1.7 - 7.7 K/uL   Lymphocytes Relative 18 %   Lymphs Abs 2.0 0.7 - 4.0 K/uL   Monocytes Relative 6 %   Monocytes Absolute 0.7 0.1 - 1.0 K/uL   Eosinophils Relative 2 %   Eosinophils Absolute 0.3 0.0 - 0.5 K/uL   Basophils Relative 0 %   Basophils Absolute 0.0 0.0 - 0.1 K/uL   Immature Granulocytes 1 %   Abs Immature Granulocytes 0.06 0.00 - 0.07 K/uL    Comment: Performed at [facility], [place] [address], [place], [place] [address]   Comprehensive metabolic panel     Status: Abnormal   Collection Time: 07/10/18  2:06 AM  Result Value Ref Range   Sodium 138 135 - 145 mmol/L   Potassium 6.2 (H) 3.5 - 5.1 mmol/L   Chloride 103 98 - 111 mmol/L   CO2 29 22 - 32 mmol/L   Glucose, Bld 58 (L) 70 - 99 mg/dL   BUN 15 8 -  23 mg/dL   Creatinine, Ser 1.15 (H) 0.44 - 1.00 mg/dL   Calcium 9.3 8.9 - 10.3 mg/dL   Total Protein 6.8 6.5 - 8.1 g/dL   Albumin 3.0 (L) 3.5 - 5.0 g/dL   AST 57 (H) 15 - 41 U/L   ALT 27 0 - 44 U/L   Alkaline Phosphatase 99 38 - 126 U/L   Total Bilirubin 1.3 (H) 0.3 - 1.2 mg/dL   GFR calc non Af Amer 51 (L) >60 mL/min   GFR calc Af Amer 59 (L) >60 mL/min   Anion gap 6 5 - 15    Comment: Performed at [facility], [place] [address] [place], [place] [address]  Brain natriuretic peptide     Status: Abnormal   Collection Time: 07/10/18  2:06 AM  Result Value Ref Range   B Natriuretic Peptide 202.8 (H) 0.0 - 100.0 pg/mL    Comment: Performed at [facility], [place] [address], [place], [place] [address]  Lipase, blood     Status: None   Collection Time: 07/10/18  2:06 AM  Result Value Ref Range   Lipase 29 11 - 51 U/L    Comment: Performed at [facility], [place] [address] [place], [place] [address]  Potassium     Status: None   Collection Time: 07/10/18  5:15 AM  Result Value Ref Range   Potassium 4.2 3.5 - 5.1 mmol/L    Comment: DELTA CHECK NOTED Performed at [place] [address] [place], [place] [address]   CBG monitoring, ED     Status: None   Collection Time: 07/10/18  6:05 AM  Result Value Ref Range   Glucose-Capillary 77 70 - 99 mg/dL   Comment 1 Notify RN   Lactate dehydrogenase     Status: None   Collection Time: 07/10/18  7:00 AM  Result Value Ref Range   LDH 142 98 - 192 U/L    Comment: Performed at [facility], [place] [address] [place], [place] [address]   Dg Chest 2 View  Result  Date: 07/10/2018 CLINICAL DATA:  Increasing shortness of Breath EXAM: CHEST - 2 VIEW COMPARISON:  06/03/2018 FINDINGS: Cardiac shadow is stable. Aortic calcifications are noted and stable. Lungs are well aerated bilaterally. Mild scarring is again seen in left base. No focal infiltrate or effusion is noted. No bony abnormality is noted. Postsurgical changes in the cervical spine are noted. IMPRESSION: Mild left basilar scarring is noted. Electronically Signed   By: Inez Catalina M.D.   On: 07/10/2018 01:03    Pending Labs Unresulted Labs (From admission, onward)    Start     Ordered   07/10/18 0624  C-reactive protein  Once,   R     07/10/18 1308   07/10/18 0623  SARS Coronavirus 2 [facility] order, Performed in [facility] hospital lab)  (Novel Coronavirus, [facility] Order) with precautions panel)  Once,   R     07/10/18 0622   07/10/18 0617  Culture, sputum-assessment  Once,   R     07/10/18 0616   07/10/18 0617  Culture, blood (routine x 2) Call MD if unable to obtain prior to antibiotics being given  BLOOD CULTURE X 2,   R    Comments:  If blood cultures drawn in Emergency Department - Do not draw and cancel order    07/10/18 0616   07/10/18 0615  HIV antibody  Once,   R  07/10/18 0616   07/10/18 0531  Influenza panel by PCR (type A & B)  Once,   R     07/10/18 0530   07/10/18 0531  Respiratory Panel by PCR  (Pediatric Respiratory Virus Panel w droplet and contact precautions)  Once,   R     07/10/18 0530   07/10/18 0206  Urinalysis, Routine w reflex microscopic  ONCE - STAT,   STAT     07/10/18 0207          Vitals/Pain Today's Vitals   07/10/18 0310 07/10/18 0436 07/10/18 0437 07/10/18 0700  BP: (!) 103/56  116/71 129/70  Pulse: 93  92 93  Resp: 13  17 15   Temp: 98.6 F (37 C)  98.4 F (36.9 C) 98.6 F (37 C)  TempSrc: Oral  Oral Oral  SpO2: 100%  94% 95%  Weight:      Height:      PainSc: 7  5  5  7      Isolation Precautions Airborne and Contact  precautions  Medications Medications  albuterol (PROVENTIL,VENTOLIN) solution continuous neb (10 mg/hr Nebulization New Bag/Given 07/10/18 0536)  dextrose 50 % solution 50 mL (has no administration in time range)  dextrose 50 % solution 50 mL (has no administration in time range)  albuterol (PROVENTIL HFA;VENTOLIN HFA) 108 (90 Base) MCG/ACT inhaler 2 puff (has no administration in time range)  dextromethorphan-guaiFENesin (MUCINEX DM) 30-600 MG per 12 hr tablet 1 tablet (has no administration in time range)  methylPREDNISolone sodium succinate (SOLU-MEDROL) 125 mg/2 mL injection 60 mg (60 mg Intravenous Given 07/10/18 0659)  nicotine (NICODERM CQ - dosed in mg/24 hours) patch 21 mg (has no administration in time range)  LORazepam (ATIVAN) tablet 1 mg (has no administration in time range)    Or  LORazepam (ATIVAN) injection 1 mg (has no administration in time range)  thiamine (VITAMIN B-1) tablet 100 mg (has no administration in time range)    Or  thiamine (B-1) injection 100 mg (has no administration in time range)  folic acid (FOLVITE) tablet 1 mg (has no administration in time range)  multivitamin with minerals tablet 1 tablet (has no administration in time range)  LORazepam (ATIVAN) injection 0-4 mg (1 mg Intravenous Given 07/10/18 0659)    Followed by  LORazepam (ATIVAN) injection 0-4 mg (has no administration in time range)  morphine 2 MG/ML injection 0.5 mg (has no administration in time range)  ipratropium (ATROVENT HFA) inhaler 2 puff (has no administration in time range)  azithromycin (ZITHROMAX) tablet 250-500 mg (has no administration in time range)  atorvastatin (LIPITOR) tablet 20 mg (has no administration in time range)  furosemide (LASIX) tablet 60 mg (has no administration in time range)  nitroGLYCERIN (NITROSTAT) SL tablet 0.4 mg (has no administration in time range)  DULoxetine (CYMBALTA) DR capsule 60 mg (has no administration in time range)  Suvorexant TABS 20 mg (has no  administration in time range)  hydrocortisone (CORTEF) tablet 5-15 mg (has no administration in time range)  levothyroxine (SYNTHROID, LEVOTHROID) tablet 150 mcg (has no administration in time range)  clopidogrel (PLAVIX) tablet 75 mg (has no administration in time range)  Fusion Plus CAPS 1 capsule (has no administration in time range)  Calcium (has no administration in time range)  Vitamin D3 2,000 Units (has no administration in time range)  magnesium oxide (MAG-OX) tablet 400 mg (has no administration in time range)  hydrocortisone (ANUSOL-HC) 2.5 % rectal cream 1 application (has no administration in time range)  enoxaparin (LOVENOX) injection 40 mg (has no administration in time range)  ondansetron (ZOFRAN) injection 4 mg (has no administration in time range)  hydrALAZINE (APRESOLINE) injection 5 mg (has no administration in time range)  acetaminophen (TYLENOL) tablet 500 mg (has no administration in time range)  insulin aspart (novoLOG) injection 0-9 Units (has no administration in time range)  insulin aspart (novoLOG) injection 0-5 Units (has no administration in time range)  iohexol (OMNIPAQUE) 300 MG/ML solution 30 mL (has no administration in time range)  morphine 4 MG/ML injection 6 mg (6 mg Intravenous Given 07/10/18 0255)  methylPREDNISolone sodium succinate (SOLU-MEDROL) 125 mg/2 mL injection 125 mg (125 mg Intravenous Given 07/10/18 0304)  albuterol (PROVENTIL) (2.5 MG/3ML) 0.083% nebulizer solution 5 mg (5 mg Nebulization Given 07/10/18 0306)  ipratropium (ATROVENT) nebulizer solution 0.5 mg (0.5 mg Nebulization Given 07/10/18 0306)  ipratropium (ATROVENT) nebulizer solution 0.5 mg (0.5 mg Nebulization Given 07/10/18 0436)  albuterol (PROVENTIL) (2.5 MG/3ML) 0.083% nebulizer solution 5 mg (5 mg Nebulization Given 07/10/18 0436)    Mobility walks Low fall risk   Focused Assessments Pulmonary Assessment Handoff:  Lung sounds:   O2 Device: Nasal Cannula O2 Flow Rate (L/min):  3 L/min      R Recommendations: See Admitting Provider Note  Report given to:   Additional Notes:

## 2018-07-10 NOTE — ED Notes (Signed)
Asked for urine  

## 2018-07-10 NOTE — ED Provider Notes (Signed)
[facility] DEPT Provider Note   CSN: [id number] Arrival date & time: 07/10/18  0006    History   Chief Complaint Chief Complaint  Patient presents with  . Shortness of Breath    HPI Crystal Yates is a 63 y.o. female.     Patient with complicated medical history including CAD, CKD (stage II), O2 dependent COPD (2L X 24 hours) in a continuous smoker, adrenal insufficiency, HTN, HLD, Hashimoto's thyroiditis, DM, sCHF (ef 60-65% 09/2017) presents with progressive SOB over the last 2-3 days. She reports low grade temperature. No nausea, vomiting. No chest pain. She is followed by Dr. Melvyn Novas who started her on Zithromax 2 days ago but she continued to get worse, prompting ED encounter. She has a nebulizer at home and has been using it twice daily x 2-3 days (Albuterol and Atrovent), where she states she normally does not need to use it at all. She has also increased her oxygen to 3L secondary to SOB as well. She has noticed "some" swelling in the LE's but reports this is usually intermittent. No recent changes in medications. She states a recent appointment was cancelled due to the current pandemic that was scheduled with surgery to recheck a remote ventral hernia repair with mesh that she feels has been increasingly painful, with increasing pressure in the abdomen. Her pain in this area is also worse. She reports that it has been discussed that this problem may be contributing to her breathing problems. She and her husband have been isolating at home, however, husband does not wear a mask when he makes weekly grocery store visits.   The history is provided by the patient. No language interpreter was used.  Shortness of Breath  Associated symptoms: abdominal pain, cough, fever ("Low Grade") and wheezing   Associated symptoms: no vomiting     Past Medical History:  Diagnosis Date  . Acute on chronic systolic heart failure exacerbation(HCC) 04/08/2016  .  Arthritis   . CAD in native artery    a. Prior LAD stenting based on cath. b. RCA stenting 03/2016 x2.  . Chronic combined systolic and diastolic CHF (congestive heart failure) ([place])   . CKD (chronic kidney disease), stage II   . COPD (chronic obstructive pulmonary disease) ([place])   . Diabetes mellitus without complication ([place])   . Dyspnea   . Hashimoto's thyroiditis   . Hyperlipidemia   . Hypertension   . Myocardial infarction ([place])   . On home oxygen therapy    "2L; 24/7" (10/23/2017)  . Secondary adrenal insufficiency ([place])   . Thyroid disease   . Tobacco abuse     Patient Active Problem List   Diagnosis Date Noted  . Chronic respiratory failure with hypoxia and hypercapnia ([place]) 02/01/2018  . Cigarette smoker 02/01/2018  . COPD  GOLD III/ c/b hypoxemia hypercarbia  01/31/2018  . Internal and external bleeding hemorrhoids 01/22/2018  . Sessile colonic polyp 01/22/2018  . Diverticulosis of colon without hemorrhage 01/22/2018  . Iron deficiency 01/11/2018  . Acute on chronic respiratory failure with hypoxia and hypercapnia ([place]) 01/04/2018  . Acute respiratory failure with hypoxia and hypercapnia (HCC)   . AKI (acute kidney injury) ([place])   . Respiratory crackles at left lung base   . Morbid obesity ([place]) 12/29/2017  . Acute on chronic heart failure ([place]) 12/27/2017  . Hyperglycemia   . Coronary stent restenosis due to progression of disease   . Pseudoaneurysm following procedure ([place])   . Pseudoaneurysm ([place]) 10/17/2017  .  S/P angioplasty for ISR of dRCA 10/13/17 10/14/2017  . CAD (coronary artery disease) 10/13/2017  . Right upper lobe pneumonia ([place]) 05/06/2017  . Pneumonia due to respiratory syncytial virus (RSV) 04/21/2017  . Entrapment, radial nerve, right 03/31/2017  . COPD without exacerbation ([place]) 10/14/2016  . Acute on chronic combined systolic and diastolic heart failure ([place]) 10/13/2016  . Chest tightness or pressure 10/12/2016  . Dyspnea on exertion 09/29/2016   . History of coronary artery disease 09/29/2016  . OAB (overactive bladder) 09/21/2016  . Neck pain 09/08/2016  . Paresthesia of arm 09/08/2016  . Flank pain 08/31/2016  . Neuralgia of right upper extremity 04/26/2016  . Coronary artery disease 04/16/2016  . Lymphocele after surgical procedure 04/16/2016  . Ventricular tachycardia (paroxysmal) ([place]) 04/09/2016  . Acute on chronic HFrEF (heart failure with reduced ejection fraction) ([place]) 04/08/2016  . Ischemic cardiomyopathy 04/08/2016  . Type 2 diabetes mellitus without complication, with long-term current use of insulin ([place]) 04/08/2016  . Anemia, blood loss 09/16/2015  . GI bleeding 09/16/2015  . Chest pain 05/22/2015  . Hypothyroid 05/22/2015  . Status post hernia repair 01/29/2015  . Surgical wound breakdown 01/29/2015  . Hernia with strangulation 01/18/2015  . Tobacco abuse 01/18/2015  . Recurrent incisional hernia with incarceration 01/15/2015  . Sleep apnea 01/15/2015  . S/P drug eluting coronary stent placement 10/16/2014  . Anemia 11/21/2013  . Unstable angina ([place]) 11/21/2013  . Atherosclerotic heart disease of native coronary artery without angina pectoris 11/21/2013  . Cardiomyopathy ([place]) 11/21/2013  . Congestive heart failure ([place]) 11/21/2013  . Diabetes mellitus with no complication ([place]) 83/66/2947  . Edema 11/21/2013  . Empty sella syndrome ([place]) 11/21/2013  . Hepatitis 11/21/2013  . Hyperlipemia 11/21/2013  . Hypertension 11/21/2013  . Kidney cyst, acquired 11/21/2013  . Fat necrosis 11/21/2013  . Pulmonary emphysema ([place]) 11/21/2013  . Sjogrens syndrome ([place]) 11/21/2013  . Thyroid disorder 11/21/2013  . Nicotine dependence, uncomplicated 65/46/5035  . Major depressive disorder, recurrent episode ([place]) 09/17/2013  . Displacement of lumbar intervertebral disc 06/14/2013  . Intractable migraine without aura 06/14/2013  . Mild cognitive impairment 06/14/2013  . Polyneuropathy in diabetes ([place]) 06/14/2013   . Secondary adrenal insufficiency ([place]) 04/12/2011  . DDD (degenerative disc disease), lumbosacral 01/11/2008  . Thoracic or lumbosacral neuritis or radiculitis 01/11/2008    Past Surgical History:  Procedure Laterality Date  . ABDOMINAL SURGERY    . CESAREAN SECTION    . CHOLECYSTECTOMY    . COLON RESECTION    . COLONOSCOPY WITH PROPOFOL N/A 01/16/2018   Procedure: COLONOSCOPY WITH PROPOFOL;  Surgeon: Rush Landmark Telford Nab., MD;  Location: [facility];  Service: Gastroenterology;  Laterality: N/A;  . CORONARY BALLOON ANGIOPLASTY N/A 10/13/2017   Procedure: CORONARY BALLOON ANGIOPLASTY;  Surgeon: Belva Crome, MD;  Location: [facility];  Service: Cardiovascular;  Laterality: N/A;  . HERNIA MESH REMOVAL    . HERNIA REPAIR    . LEFT HEART CATH AND CORONARY ANGIOGRAPHY N/A 10/13/2016   Procedure: Left Heart Cath and Coronary Angiography;  Surgeon: Nelva Bush, MD;  Location: [facility];  Service: Cardiovascular;  Laterality: N/A;  . POLYPECTOMY  01/16/2018   Procedure: POLYPECTOMY;  Surgeon: Rush Landmark Telford Nab., MD;  Location: [facility];  Service: Gastroenterology;;  . RIGHT/LEFT HEART CATH AND CORONARY ANGIOGRAPHY N/A 10/13/2017   Procedure: RIGHT/LEFT HEART CATH AND CORONARY ANGIOGRAPHY;  Surgeon: Belva Crome, MD;  Location: [facility];  Service: Cardiovascular;  Laterality: N/A;  . SHOULDER  ARTHROSCOPY    . TUBAL LIGATION       OB History   No obstetric history on file.      Home Medications    Prior to Admission medications   Medication Sig Start Date End Date Taking? Authorizing Provider  acetaminophen (TYLENOL) 325 MG tablet Take 2 tablets (650 mg total) by mouth every 4 (four) hours as needed for headache or mild pain. Patient taking differently: Take 1,000 mg by mouth every 4 (four) hours as needed for headache or mild pain.  10/14/17   Isaiah Serge, NP  albuterol [facility] HFA) 108 [phone] Base) MCG/ACT inhaler Inhale 2 puffs into the  lungs every 4-6 hours as needed for shortness of breath or wheezing 01/31/18   Tanda Rockers, MD  albuterol (PROVENTIL) (2.5 MG/3ML) 0.083% nebulizer solution Take 3 mLs (2.5 mg total) by nebulization every 6 (six) hours as needed for wheezing or shortness of breath. 02/28/18   Tanda Rockers, MD  atorvastatin (LIPITOR) 80 MG tablet Take 1 tablet (80 mg total) by mouth daily at 6 PM. 10/14/17   Isaiah Serge, NP  azithromycin (ZITHROMAX) 250 MG tablet Take 1 tablet (250 mg total) by mouth as directed. 07/09/18   Tanda Rockers, MD  benzonatate (TESSALON) 100 MG capsule Take 1 capsule (100 mg total) by mouth every 8 (eight) hours. 06/03/18   Domenic Moras, PA-C  CALCIUM PO Take 1 tablet by mouth daily.    [provider]  cephALEXin (KEFLEX) 500 MG capsule Take 1 capsule (500 mg total) by mouth 2 (two) times daily. 05/13/18   Orpah Greek, MD  Cholecalciferol (VITAMIN D3) 2000 units capsule Take 2,000 Units by mouth daily.     [provider]  clopidogrel (PLAVIX) 75 MG tablet Take 1 tablet (75 mg total) by mouth daily. 01/20/18   Masoudi, Dorthula Rue, MD  DULoxetine (CYMBALTA) 60 MG capsule Take 60 mg by mouth daily.  12/05/16   [provider]  Galcanezumab-gnlm (EMGALITY) 120 MG/ML SOSY Inject 120 mg into the skin every 30 (thirty) days.     [provider]  HYDROcodone-acetaminophen (NORCO/VICODIN) 5-325 MG tablet Take 1 tablet by mouth every 4 (four) hours as needed for moderate pain. Patient not taking: Reported on 06/03/2018 05/13/18   Orpah Greek, MD  hydrocortisone (ANUSOL-HC) 2.5 % rectal cream Place rectally 2 (two) times daily. Patient taking differently: Place 1 application rectally as needed for hemorrhoids or anal itching.  01/17/18   Masoudi, Elhamalsadat, MD  hydrocortisone (CORTEF) 5 MG tablet Take 5-15 mg by mouth See admin instructions. Take 15 mg in the morning and 5 mg in the evening    [provider]  insulin degludec  (TRESIBA FLEXTOUCH) 100 UNIT/ML SOPN FlexTouch Pen Inject 40 Units into the skin at bedtime.  08/28/16   [provider]  Iron-FA-B Cmp-C-Biot-Probiotic (FUSION PLUS) CAPS Take 1 capsule by mouth at bedtime.  10/09/16   [provider]  levothyroxine (SYNTHROID, LEVOTHROID) 150 MCG tablet Take 150 mcg by mouth daily before breakfast.    [provider]  Magnesium 400 MG TABS Take 400 mg by mouth at bedtime.     [provider]  methylPREDNISolone (MEDROL) 8 MG tablet Take 1 tablet (8 mg total) by mouth daily. 07/09/18   Tanda Rockers, MD  nitroGLYCERIN (NITROSTAT) 0.4 MG SL tablet Place 0.4 mg under the tongue every 5 (five) minutes as needed for chest pain.     [provider]  OXYGEN Place 2 L into the nose continuous.     [provider]  polyethylene glycol (MIRALAX / GLYCOLAX) packet Take 17 g by mouth daily. Patient taking differently: Take 17 g by mouth daily as needed.  12/27/17   Masoudi, Elhamalsadat, MD  potassium chloride SA (K-DUR,KLOR-CON) 20 MEQ tablet Take 1 tablet (20 mEq total) by mouth daily. Take when you take Torsemide. 01/17/18   Masoudi, Elhamalsadat, MD  Suvorexant (BELSOMRA) 20 MG TABS Take 20 mg by mouth at bedtime.     [provider]  Tiotropium Bromide-Olodaterol (STIOLTO RESPIMAT) 2.5-2.5 MCG/ACT AERS Inhale 2 puffs into the lungs daily. 01/31/18   Tanda Rockers, MD  torsemide (DEMADEX) 20 MG tablet Take 60 mg in the morning and 40 mg in the evening daily. Patient taking differently: Take 60 mg by mouth daily. . 02/02/18   Park Liter, MD  TRULICITY 1.5 WC/5.8NI SOPN Inject 1.5 mg into the skin every Sunday.  10/10/16   [provider]    Family History Family History  Problem Relation Age of Onset  . Stroke Mother   . Diabetes Father   . Diabetes Sister   . Diabetes Sister   . Diabetes Son     Social History Social History   Tobacco Use  . Smoking status: Current Every Day Smoker     Packs/day: 0.75    Years: 44.00    Pack years: 33.00    Types: Cigarettes  . Smokeless tobacco: Never Used  Substance Use Topics  . Alcohol use: Yes    Alcohol/week: 7.0 standard drinks    Types: 7 Shots of liquor per week  . Drug use: Yes    Types: Marijuana    Comment: 10/23/2017 "had 1 ~ 1 month ago; nothing before or since"     Allergies   Hydroxychloroquine; Donepezil; Prednisone; Anticoagulant cit dext [acd formula a]; Bupropion; Metrizamide; Varenicline; and Tape   Review of Systems Review of Systems  Constitutional: Positive for fever ("Low Grade"). Negative for chills.  HENT: Negative.   Respiratory: Positive for cough, shortness of breath and wheezing.   Cardiovascular: Negative.   Gastrointestinal: Positive for abdominal pain. Negative for vomiting.  Musculoskeletal: Negative.   Skin: Negative.   Neurological: Negative.  Negative for weakness.     Physical Exam Updated Vital Signs BP 132/73 (BP Location: Left Arm)   Pulse 96   Temp 98 F (36.7 C) (Oral)   Resp (!) 21   Ht 5\' 2"  (1.575 m)   Wt 103 kg   SpO2 98%   BMI 41.52 kg/m   Physical Exam Vitals signs and nursing note reviewed.  Constitutional:      General: She is not in acute distress.    Appearance: She is well-developed. She is ill-appearing.  HENT:     Head: Normocephalic.  Neck:     Musculoskeletal: Normal range of motion and neck supple.  Cardiovascular:     Rate and Rhythm: Normal rate and regular rhythm.  Pulmonary:     Effort: Pulmonary effort is normal.     Breath sounds: Decreased breath sounds and wheezing present.     Comments: Prolonged expirations. Abdominal:     General: Bowel sounds are normal.     Palpations: Abdomen is soft.     Tenderness: There is abdominal tenderness. There is no guarding or rebound.     Comments: Obese abdomen with central tenderness. There is a healed midline surgical incisional scar with a pinpoint area of drainage (  not new per patient). There  is an adjacent abdominal wall hernia present that is soft.   Musculoskeletal: Normal range of motion.  Skin:    General: Skin is warm and dry.     Findings: No rash.  Neurological:     Mental Status: She is alert.     Cranial Nerves: No cranial nerve deficit.      ED Treatments / Results  Labs (all labs ordered are listed, but only abnormal results are displayed) Labs Reviewed - No data to display  EKG EKG Interpretation  Date/Time:  Tuesday July 10 2018 00:16:43 EDT Ventricular Rate:  95 PR Interval:    QRS Duration: 107 QT Interval:  391 QTC Calculation: 492 R Axis:   72 Text Interpretation:  Sinus rhythm Ventricular premature complex Biatrial enlargement Borderline T wave abnormalities Borderline prolonged QT interval When compared with ECG of 06/03/2018, No significant change was found Confirmed by Delora Fuel ([id number]) on 07/10/2018 12:29:51 AM   Radiology Dg Chest 2 View  Result Date: 07/10/2018 CLINICAL DATA:  Increasing shortness of Breath EXAM: CHEST - 2 VIEW COMPARISON:  06/03/2018 FINDINGS: Cardiac shadow is stable. Aortic calcifications are noted and stable. Lungs are well aerated bilaterally. Mild scarring is again seen in left base. No focal infiltrate or effusion is noted. No bony abnormality is noted. Postsurgical changes in the cervical spine are noted. IMPRESSION: Mild left basilar scarring is noted. Electronically Signed   By: Inez Catalina M.D.   On: 07/10/2018 01:03    Procedures Procedures (including critical care time)  Medications Ordered in ED Medications - No data to display   Initial Impression / Assessment and Plan / ED Course  I have reviewed the triage vital signs and the nursing notes.  Pertinent labs & imaging results that were available during my care of the patient were reviewed by me and considered in my medical decision making (see chart for details).    Rickey Farrier was evaluated in the Emergency Department on 07/10/18 for the  symptoms described in the history of present illness. She was evaluated in the context of the global COVID-19 pandemic, which necessitated consideration that the patient might be at risk for infection with the SARS-CoV-2 virus that causes COVID-19. Institutional protocols and algorithms that pertain to the evaluation of patients at risk for COVID-19 are in a state of rapid change based on information released by regulatory bodies including the CDC and federal and state organizations. These policies and algorithms were followed during the patient' scare in the ED.       Patient to ED with progressive SOB over 2-3 days, increased oxygen requirement. History of COPD, started on medrol dosepack and zithromax 1 day ago and seeing no improvement.   She appears relatively comfortable. She is maintaining 93% O2 saturations on 3 L, normally on 2 L. CXR does not show infiltrates, no fever in the ED.   IV solumedrol dose provided, with nebulizer treatment for suspected COPD exacerbation. She is seen and evaluated by Dr. Roxanne Mins.  She has had nebulizers (Albuterol and Atrovent) x 2 and IV solumedrol and does not feel she is improving. Will start continuous nebulizer. Do not feel that she will improve enough to be considered appropriate for discharge home and will require hospital treatment for stabilization.   Final Clinical Impressions(s) / ED Diagnoses   Final diagnoses:  None   1. COPD exacerbation  ED Discharge Orders    None       Charlann Lange, [place]  09/25/14 0109    Delora Fuel, MD 32/35/57 [phone]

## 2018-07-10 NOTE — ED Notes (Signed)
ED TO INPATIENT HANDOFF REPORT  ED Nurse Name and Phone #: Maren [place] ed [phone]  S Name/Age/Gender Crystal Yates 63 y.o. female Room/Bed: WA12/WA12  Code Status   Code Status: Full Code  Home/SNF/Other Home Patient oriented to: self, place, time and situation Is this baseline? Yes   Triage Complete: Triage complete  Chief Complaint shortness of breath  Triage Note Pt c/o "increased SOB, received erythromycin from Dr. Melvyn Novas.  He told me if I didn't get better I should come to the hospital.  I told him I had diarrhea a little bit yesterday.  I have belly pain where I have the mesh and maybe that's part of the problem."   Allergies Allergies  Allergen Reactions  . Hydroxychloroquine Shortness Of Breath, Nausea Only and Other (See Comments)    Dizziness  . Donepezil Other (See Comments)    Dizziness, depression, and makes the patient feel "funny"  . Prednisone Other (See Comments)    Causes depression and suicidal thoughts  . Anticoagulant Cit Dext [Acd Formula A] Other (See Comments)    Unknown  . Bupropion Other (See Comments)    Suicidal thoughts  . Metrizamide Other (See Comments)    (a non-ionic radiopaque contrast agent) "Blows the vein" and contrast gathers at the injected site's limb  . Varenicline Other (See Comments)    Suicidal thoughts  . Tape Rash and Other (See Comments)    Paper tape is preferred, PLEASE    Level of Care/Admitting Diagnosis ED Disposition    ED Disposition Condition [facility] Area: [place] [[id number]]  Level of Care: Telemetry [5]  Admit to tele based on following criteria: Other see comments  Comments: SOB  Diagnosis: COPD exacerbation [facility]) [[id number]]  Admitting Physician: Ivor Costa [[id number]]  Attending Physician: Ivor Costa [phone]  Estimated length of stay: past midnight tomorrow  Certification:: I certify this patient will need inpatient services for at least 2 midnights  Bed request  comments: COVID19 rule out, low risk  PT Class (Do Not Modify): Inpatient [101]  PT Acc Code (Do Not Modify): Private [1]       B Medical/Surgery History Past Medical History:  Diagnosis Date  . Acute on chronic systolic heart failure exacerbation(HCC) 04/08/2016  . Arthritis   . CAD in native artery    a. Prior LAD stenting based on cath. b. RCA stenting 03/2016 x2.  . Chronic combined systolic and diastolic CHF (congestive heart failure) ([place])   . CKD (chronic kidney disease), stage II   . COPD (chronic obstructive pulmonary disease) ([place])   . Diabetes mellitus without complication ([place])   . Dyspnea   . Hashimoto's thyroiditis   . Hyperlipidemia   . Hypertension   . Myocardial infarction ([place])   . On home oxygen therapy    "2L; 24/7" (10/23/2017)  . Secondary adrenal insufficiency ([place])   . Thyroid disease   . Tobacco abuse    Past Surgical History:  Procedure Laterality Date  . ABDOMINAL SURGERY    . CESAREAN SECTION    . CHOLECYSTECTOMY    . COLON RESECTION    . COLONOSCOPY WITH PROPOFOL N/A 01/16/2018   Procedure: COLONOSCOPY WITH PROPOFOL;  Surgeon: Rush Landmark Telford Nab., MD;  Location: [place];  Service: Gastroenterology;  Laterality: N/A;  . CORONARY BALLOON ANGIOPLASTY N/A 10/13/2017   Procedure: CORONARY BALLOON ANGIOPLASTY;  Surgeon: Belva Crome, MD;  Location: [place] [facility];  Service: Cardiovascular;  Laterality: N/A;  .  HERNIA MESH REMOVAL    . HERNIA REPAIR    . LEFT HEART CATH AND CORONARY ANGIOGRAPHY N/A 10/13/2016   Procedure: Left Heart Cath and Coronary Angiography;  Surgeon: Nelva Bush, MD;  Location: [place] [facility];  Service: Cardiovascular;  Laterality: N/A;  . POLYPECTOMY  01/16/2018   Procedure: POLYPECTOMY;  Surgeon: Rush Landmark Telford Nab., MD;  Location: [place];  Service: Gastroenterology;;  . RIGHT/LEFT HEART CATH AND CORONARY ANGIOGRAPHY N/A 10/13/2017   Procedure: RIGHT/LEFT HEART CATH AND CORONARY ANGIOGRAPHY;   Surgeon: Belva Crome, MD;  Location: [place] [facility];  Service: Cardiovascular;  Laterality: N/A;  . SHOULDER ARTHROSCOPY    . TUBAL LIGATION       A IV Location/Drains/Wounds Patient Lines/Drains/Airways Status   Active Line/Drains/Airways    Name:   Placement date:   Placement time:   Site:   Days:   Peripheral IV 06/03/18 Left Antecubital   06/03/18    1636    Antecubital   37   Peripheral IV 07/10/18 Left Antecubital   07/10/18    0545    Antecubital   less than 1   External Urinary Catheter   06/03/18    1415    -   37   Wound / Incision (Open or Dehisced) 12/24/17 Other (Comment) Buttocks Right Stage II   12/24/17    1651    Buttocks   198   Wound / Incision (Open or Dehisced) 12/24/17 Other (Comment) Abdomen Anterior post surgical bowel resection    12/24/17    1656    Abdomen   198          Intake/Output Last 24 hours No intake or output data in the 24 hours ending 07/10/18 0702  Labs/Imaging Results for orders placed or performed during the hospital encounter of 07/10/18 (from the past 48 hour(s))  CBC with Differential     Status: Abnormal   Collection Time: 07/10/18  2:06 AM  Result Value Ref Range   WBC 11.2 (H) 4.0 - 10.5 K/uL   RBC 4.12 3.87 - 5.11 MIL/uL   Hemoglobin 12.3 12.0 - 15.0 g/dL   HCT 39.4 36.0 - 46.0 %   MCV 95.6 80.0 - 100.0 fL   MCH 29.9 26.0 - 34.0 pg   MCHC 31.2 30.0 - 36.0 g/dL   RDW 15.6 (H) 11.5 - 15.5 %   Platelets 204 150 - 400 K/uL   nRBC 0.0 0.0 - 0.2 %   Neutrophils Relative % 73 %   Neutro Abs 8.1 (H) 1.7 - 7.7 K/uL   Lymphocytes Relative 18 %   Lymphs Abs 2.0 0.7 - 4.0 K/uL   Monocytes Relative 6 %   Monocytes Absolute 0.7 0.1 - 1.0 K/uL   Eosinophils Relative 2 %   Eosinophils Absolute 0.3 0.0 - 0.5 K/uL   Basophils Relative 0 %   Basophils Absolute 0.0 0.0 - 0.1 K/uL   Immature Granulocytes 1 %   Abs Immature Granulocytes 0.06 0.00 - 0.07 K/uL    Comment: Performed at [facility], [place] [address] [place], [place] [address]  Comprehensive metabolic panel     Status: Abnormal   Collection Time: 07/10/18  2:06 AM  Result Value Ref Range   Sodium 138 135 - 145 mmol/L   Potassium 6.2 (H) 3.5 - 5.1 mmol/L   Chloride 103 98 - 111 mmol/L   CO2 29 22 - 32 mmol/L   Glucose, Bld 58 (L) 70 - 99  mg/dL   BUN 15 8 - 23 mg/dL   Creatinine, Ser 1.15 (H) 0.44 - 1.00 mg/dL   Calcium 9.3 8.9 - 10.3 mg/dL   Total Protein 6.8 6.5 - 8.1 g/dL   Albumin 3.0 (L) 3.5 - 5.0 g/dL   AST 57 (H) 15 - 41 U/L   ALT 27 0 - 44 U/L   Alkaline Phosphatase 99 38 - 126 U/L   Total Bilirubin 1.3 (H) 0.3 - 1.2 mg/dL   GFR calc non Af Amer 51 (L) >60 mL/min   GFR calc Af Amer 59 (L) >60 mL/min   Anion gap 6 5 - 15    Comment: Performed at [facility], [place] [address] [place], [place] [address]  Brain natriuretic peptide     Status: Abnormal   Collection Time: 07/10/18  2:06 AM  Result Value Ref Range   B Natriuretic Peptide 202.8 (H) 0.0 - 100.0 pg/mL    Comment: Performed at [facility], [place] [address], [place], [place] [address]  Lipase, blood     Status: None   Collection Time: 07/10/18  2:06 AM  Result Value Ref Range   Lipase 29 11 - 51 U/L    Comment: Performed at [facility], [place] [address] [place], [place] [address]  Potassium     Status: None   Collection Time: 07/10/18  5:15 AM  Result Value Ref Range   Potassium 4.2 3.5 - 5.1 mmol/L    Comment: DELTA CHECK NOTED Performed at [place] [address], [place], [place] [address]   CBG monitoring, ED     Status: None   Collection Time: 07/10/18  6:05 AM  Result Value Ref Range   Glucose-Capillary 77 70 - 99 mg/dL   Comment 1 Notify RN    Dg Chest 2 View  Result Date: 07/10/2018 CLINICAL DATA:  Increasing shortness of Breath EXAM: CHEST - 2 VIEW COMPARISON:  06/03/2018 FINDINGS: Cardiac shadow is stable. Aortic calcifications are noted and stable. Lungs are well  aerated bilaterally. Mild scarring is again seen in left base. No focal infiltrate or effusion is noted. No bony abnormality is noted. Postsurgical changes in the cervical spine are noted. IMPRESSION: Mild left basilar scarring is noted. Electronically Signed   By: Inez Catalina M.D.   On: 07/10/2018 01:03    Pending Labs Unresulted Labs (From admission, onward)    Start     Ordered   07/10/18 0624  C-reactive protein  Once,   R     07/10/18 0623   07/10/18 0624  Lactate dehydrogenase  Add-on,   R     07/10/18 0623   07/10/18 0623  SARS Coronavirus 2 [facility] order, Performed in [facility] hospital lab)  (Novel Coronavirus, [facility] Order) with precautions panel)  Once,   R     07/10/18 0622   07/10/18 0617  Culture, sputum-assessment  Once,   R     07/10/18 0616   07/10/18 0617  Culture, blood (routine x 2) Call MD if unable to obtain prior to antibiotics being given  BLOOD CULTURE X 2,   R    Comments:  If blood cultures drawn in Emergency Department - Do not draw and cancel order    07/10/18 0616   07/10/18 0615  HIV antibody  Once,   R     07/10/18 0616   07/10/18 0531  Influenza panel by PCR (type A & B)  Once,  R     07/10/18 0530   07/10/18 0531  Respiratory Panel by PCR  (Pediatric Respiratory Virus Panel w droplet and contact precautions)  Once,   R     07/10/18 0530   07/10/18 0206  Urinalysis, Routine w reflex microscopic  ONCE - STAT,   STAT     07/10/18 0207          Vitals/Pain Today's Vitals   07/10/18 0310 07/10/18 0436 07/10/18 0437 07/10/18 0700  BP: (!) 103/56  116/71 129/70  Pulse: 93  92 93  Resp: 13  17 15   Temp: 98.6 F (37 C)  98.4 F (36.9 C) 98.6 F (37 C)  TempSrc: Oral  Oral Oral  SpO2: 100%  94% 95%  Weight:      Height:      PainSc: 7  5  5  7      Isolation Precautions Airborne and Contact precautions  Medications Medications  albuterol (PROVENTIL,VENTOLIN) solution continuous neb (10 mg/hr Nebulization New Bag/Given 07/10/18  0536)  dextrose 50 % solution 50 mL (has no administration in time range)  dextrose 50 % solution 50 mL (has no administration in time range)  albuterol (PROVENTIL HFA;VENTOLIN HFA) 108 (90 Base) MCG/ACT inhaler 2 puff (has no administration in time range)  dextromethorphan-guaiFENesin (MUCINEX DM) 30-600 MG per 12 hr tablet 1 tablet (has no administration in time range)  methylPREDNISolone sodium succinate (SOLU-MEDROL) 125 mg/2 mL injection 60 mg (60 mg Intravenous Given 07/10/18 0659)  nicotine (NICODERM CQ - dosed in mg/24 hours) patch 21 mg (has no administration in time range)  LORazepam (ATIVAN) tablet 1 mg (has no administration in time range)    Or  LORazepam (ATIVAN) injection 1 mg (has no administration in time range)  thiamine (VITAMIN B-1) tablet 100 mg (has no administration in time range)    Or  thiamine (B-1) injection 100 mg (has no administration in time range)  folic acid (FOLVITE) tablet 1 mg (has no administration in time range)  multivitamin with minerals tablet 1 tablet (has no administration in time range)  LORazepam (ATIVAN) injection 0-4 mg (1 mg Intravenous Given 07/10/18 0659)    Followed by  LORazepam (ATIVAN) injection 0-4 mg (has no administration in time range)  morphine 2 MG/ML injection 0.5 mg (has no administration in time range)  ipratropium (ATROVENT HFA) inhaler 2 puff (has no administration in time range)  azithromycin (ZITHROMAX) tablet 250-500 mg (has no administration in time range)  atorvastatin (LIPITOR) tablet 20 mg (has no administration in time range)  furosemide (LASIX) tablet 60 mg (has no administration in time range)  nitroGLYCERIN (NITROSTAT) SL tablet 0.4 mg (has no administration in time range)  DULoxetine (CYMBALTA) DR capsule 60 mg (has no administration in time range)  Suvorexant TABS 20 mg (has no administration in time range)  hydrocortisone (CORTEF) tablet 5-15 mg (has no administration in time range)  levothyroxine (SYNTHROID,  LEVOTHROID) tablet 150 mcg (has no administration in time range)  clopidogrel (PLAVIX) tablet 75 mg (has no administration in time range)  Fusion Plus CAPS 1 capsule (has no administration in time range)  Calcium (has no administration in time range)  Vitamin D3 2,000 Units (has no administration in time range)  magnesium oxide (MAG-OX) tablet 400 mg (has no administration in time range)  hydrocortisone (ANUSOL-HC) 2.5 % rectal cream 1 application (has no administration in time range)  enoxaparin (LOVENOX) injection 40 mg (has no administration in time range)  ondansetron (ZOFRAN) injection 4 mg (has  no administration in time range)  hydrALAZINE (APRESOLINE) injection 5 mg (has no administration in time range)  acetaminophen (TYLENOL) tablet 500 mg (has no administration in time range)  insulin aspart (novoLOG) injection 0-9 Units (has no administration in time range)  insulin aspart (novoLOG) injection 0-5 Units (has no administration in time range)  iohexol (OMNIPAQUE) 300 MG/ML solution 30 mL (has no administration in time range)  morphine 4 MG/ML injection 6 mg (6 mg Intravenous Given 07/10/18 0255)  methylPREDNISolone sodium succinate (SOLU-MEDROL) 125 mg/2 mL injection 125 mg (125 mg Intravenous Given 07/10/18 0304)  albuterol (PROVENTIL) (2.5 MG/3ML) 0.083% nebulizer solution 5 mg (5 mg Nebulization Given 07/10/18 0306)  ipratropium (ATROVENT) nebulizer solution 0.5 mg (0.5 mg Nebulization Given 07/10/18 0306)  ipratropium (ATROVENT) nebulizer solution 0.5 mg (0.5 mg Nebulization Given 07/10/18 0436)  albuterol (PROVENTIL) (2.5 MG/3ML) 0.083% nebulizer solution 5 mg (5 mg Nebulization Given 07/10/18 0436)    Mobility {Mobility uses walker or power wheel chair   Focused Assessments Renal Assessment Handoff:  Hemodialysis Schedule:  Last Hemodialysis date and time:    Restricted appendage:  , Pulmonary Assessment Handoff:  Lung sounds:   O2 Device: Nasal Cannula O2 Flow Rate  (L/min): 3 L/min      R Recommendations: See Admitting Provider Note  Report given to:   Additional Notes:

## 2018-07-10 NOTE — H&P (Signed)
History and Physical    Crystal Yates TDD:[id number] DOB: March 27, 1956 DOA: 07/10/2018  Referring MD/NP/PA:   PCP: Dortha Kern, PA   Patient coming from:  The patient is coming from home.  At baseline, pt is independent for most of ADL.        Chief Complaint: SOB, cough and abdominal pain  HPI: Crystal Yates is a 63 y.o. female with medical history significant of COPD on 2 L nasal cannula oxygen, hypertension, hyperlipidemia, diabetes mellitus, hypothyroidism, depression, CHF, CAD, CKD-2, tobacco abuse, adrenal insufficiency, alcohol use, who presents with shortness of breath, cough and abdominal pain.  Patient states that she has been having shortness of breath and cough for several days, which has been progressively getting worse.  She coughs up little yellow-colored sputum.  Denies fever or chills.  Patient states that she has chest pain, but pointing to the central abdomen, actually she does not have chest pain.  Her abdominal pain is constant, 8 out of 10 in severity, located in central abdomen, sharp, nonradiating.  She states that she has history of hernia repair, and her current abdominal pain is located in the same area.  She has nausea, no vomiting.  She states that she has intermittent loose stool bowel movement, but she is taking MiraLAX at home.  Denies symptoms of UTI or unilateral weakness.  Patient was seen by her pulmonologist, Dr. Marlene Lard and was given prescription of azithromycin on 4/13, without significant help. Of note, she is using 2 L nasal cannula oxygen at home, but now has increased oxygen requirement up to 3 L.  Her oxygen saturation 94% on 3 L oxygen now.  She states that her husband needs to go outside to work, but states that her husband does not seem to have COVID-19.  She does not have contact with person who is known to have COVID-19.  ED Course: pt was found to have WBC 11.2, BMP to 2.8, lipase 29, potassium 6.2 with hemolysis-->4.2 on repeated  K level, stable renal function, temperature 98.4, heart rate 80-90s, tachypnea, chest x-ray showed mild left sided scarring without infiltration.  Patient is admitted to telemetry bed as inpatient.  Review of Systems:   General: no fevers, chills, no body weight gain, has fatigue HEENT: no blurry vision, hearing changes or sore throat Respiratory: has dyspnea, coughing, wheezing CV: no chest pain, no palpitations GI: has nausea,, abdominal pain, diarrhea, no constipation, vomiting GU: no dysuria, burning on urination, increased urinary frequency, hematuria  Ext: has mild leg edema Neuro: no unilateral weakness, numbness, or tingling, no vision change or hearing loss Skin: no rash, no skin tear. MSK: No muscle spasm, no deformity, no limitation of range of movement in spin Heme: No easy bruising.  Travel history: No recent long distant travel.  Allergy:  Allergies  Allergen Reactions  . Hydroxychloroquine Shortness Of Breath, Nausea Only and Other (See Comments)    Dizziness  . Donepezil Other (See Comments)    Dizziness, depression, and makes the patient feel "funny"  . Prednisone Other (See Comments)    Causes depression and suicidal thoughts  . Anticoagulant Cit Dext [Acd Formula A] Other (See Comments)    Unknown  . Bupropion Other (See Comments)    Suicidal thoughts  . Metrizamide Other (See Comments)    (a non-ionic radiopaque contrast agent) "Blows the vein" and contrast gathers at the injected site's limb  . Varenicline Other (See Comments)    Suicidal thoughts  . Tape Rash and Other (  See Comments)    Paper tape is preferred, PLEASE    Past Medical History:  Diagnosis Date  . Acute on chronic systolic heart failure exacerbation(HCC) 04/08/2016  . Arthritis   . CAD in native artery    a. Prior LAD stenting based on cath. b. RCA stenting 03/2016 x2.  . Chronic combined systolic and diastolic CHF (congestive heart failure) ([place])   . CKD (chronic kidney disease), stage  II   . COPD (chronic obstructive pulmonary disease) ([place])   . Diabetes mellitus without complication ([place])   . Dyspnea   . Hashimoto's thyroiditis   . Hyperlipidemia   . Hypertension   . Myocardial infarction ([place])   . On home oxygen therapy    "2L; 24/7" (10/23/2017)  . Secondary adrenal insufficiency ([place])   . Thyroid disease   . Tobacco abuse     Past Surgical History:  Procedure Laterality Date  . ABDOMINAL SURGERY    . CESAREAN SECTION    . CHOLECYSTECTOMY    . COLON RESECTION    . COLONOSCOPY WITH PROPOFOL N/A 01/16/2018   Procedure: COLONOSCOPY WITH PROPOFOL;  Surgeon: Rush Landmark Telford Nab., MD;  Location: [facility];  Service: Gastroenterology;  Laterality: N/A;  . CORONARY BALLOON ANGIOPLASTY N/A 10/13/2017   Procedure: CORONARY BALLOON ANGIOPLASTY;  Surgeon: Belva Crome, MD;  Location: [facility];  Service: Cardiovascular;  Laterality: N/A;  . HERNIA MESH REMOVAL    . HERNIA REPAIR    . LEFT HEART CATH AND CORONARY ANGIOGRAPHY N/A 10/13/2016   Procedure: Left Heart Cath and Coronary Angiography;  Surgeon: Nelva Bush, MD;  Location: [facility];  Service: Cardiovascular;  Laterality: N/A;  . POLYPECTOMY  01/16/2018   Procedure: POLYPECTOMY;  Surgeon: Rush Landmark Telford Nab., MD;  Location: [facility];  Service: Gastroenterology;;  . RIGHT/LEFT HEART CATH AND CORONARY ANGIOGRAPHY N/A 10/13/2017   Procedure: RIGHT/LEFT HEART CATH AND CORONARY ANGIOGRAPHY;  Surgeon: Belva Crome, MD;  Location: [facility];  Service: Cardiovascular;  Laterality: N/A;  . SHOULDER ARTHROSCOPY    . TUBAL LIGATION      Social History:  reports that she has been smoking cigarettes. She has a 33.00 pack-year smoking history. She has never used smokeless tobacco. She reports current alcohol use of about 7.0 standard drinks of alcohol per week. She reports current drug use. Drug: Marijuana.  Family History:  Family History  Problem Relation Age of Onset  .  Stroke Mother   . Diabetes Father   . Diabetes Sister   . Diabetes Sister   . Diabetes Son      Prior to Admission medications   Medication Sig Start Date End Date Taking? Authorizing Provider  acetaminophen (TYLENOL) 500 MG tablet Take 1,500 mg by mouth every 6 (six) hours as needed for mild pain.   Yes [provider]  albuterol (PROAIR HFA) 108 (90 Base) MCG/ACT inhaler Inhale 2 puffs into the lungs every 4-6 hours as needed for shortness of breath or wheezing 01/31/18  Yes Tanda Rockers, MD  albuterol (PROVENTIL) (2.5 MG/3ML) 0.083% nebulizer solution Take 3 mLs (2.5 mg total) by nebulization every 6 (six) hours as needed for wheezing or shortness of breath. 02/28/18  Yes Tanda Rockers, MD  atorvastatin (LIPITOR) 20 MG tablet Take 20 mg by mouth every evening. 06/18/18  Yes [provider]  azithromycin (ZITHROMAX) 250 MG tablet Take 1 tablet (250 mg total) by mouth as directed. Patient taking differently: Take 250-500 mg by mouth  See admin instructions. Take 500 mg by mouth on day 1 then take 250 mg by mouth on day 2-5 07/09/18  Yes Tanda Rockers, MD  CALCIUM PO Take 1 tablet by mouth daily.   Yes [provider]  Cholecalciferol (VITAMIN D3) 2000 units capsule Take 2,000 Units by mouth daily.    Yes [provider]  clopidogrel (PLAVIX) 75 MG tablet Take 1 tablet (75 mg total) by mouth daily. Patient taking differently: Take 75 mg by mouth at bedtime.  01/20/18  Yes Masoudi, Elhamalsadat, MD  DULoxetine (CYMBALTA) 60 MG capsule Take 60 mg by mouth daily.  12/05/16  Yes [provider]  furosemide (LASIX) 20 MG tablet Take 60 mg by mouth daily.  06/18/18  Yes [provider]  Galcanezumab-gnlm (EMGALITY) 120 MG/ML SOSY Inject 120 mg into the skin every 30 (thirty) days.    Yes [provider]  hydrocortisone (ANUSOL-HC) 2.5 % rectal cream Place rectally 2 (two) times daily. Patient taking differently: Place 1 application  rectally daily as needed for hemorrhoids or anal itching.  01/17/18  Yes Masoudi, Elhamalsadat, MD  hydrocortisone (CORTEF) 5 MG tablet Take 5-15 mg by mouth See admin instructions. Take 15 mg in the morning and 5 mg in the evening   Yes [provider]  insulin degludec (TRESIBA FLEXTOUCH) 100 UNIT/ML SOPN FlexTouch Pen Inject 42 Units into the skin at bedtime.  08/28/16  Yes [provider]  Iron-FA-B Cmp-C-Biot-Probiotic (FUSION PLUS) CAPS Take 1 capsule by mouth at bedtime.  10/09/16  Yes [provider]  levothyroxine (SYNTHROID, LEVOTHROID) 150 MCG tablet Take 150 mcg by mouth daily before breakfast.   Yes [provider]  MAGNESIUM-OXIDE 400 (241.3 Mg) MG tablet Take 400 mg by mouth daily. 06/18/18  Yes [provider]  methylPREDNISolone (MEDROL) 8 MG tablet Take 1 tablet (8 mg total) by mouth daily. 07/09/18  Yes Tanda Rockers, MD  nitroGLYCERIN (NITROSTAT) 0.4 MG SL tablet Place 0.4 mg under the tongue every 5 (five) minutes as needed for chest pain.    Yes [provider]  OXYGEN Place 2 L into the nose continuous.    Yes [provider]  polyethylene glycol (MIRALAX / GLYCOLAX) packet Take 17 g by mouth daily. Patient taking differently: Take 17 g by mouth daily as needed for mild constipation.  12/27/17  Yes Masoudi, Elhamalsadat, MD  potassium chloride SA (K-DUR,KLOR-CON) 20 MEQ tablet Take 1 tablet (20 mEq total) by mouth daily. Take when you take Torsemide. 01/17/18  Yes Masoudi, Elhamalsadat, MD  Suvorexant (BELSOMRA) 20 MG TABS Take 20 mg by mouth at bedtime.    Yes [provider]  Tiotropium Bromide-Olodaterol (STIOLTO RESPIMAT) 2.5-2.5 MCG/ACT AERS Inhale 2 puffs into the lungs daily. 01/31/18  Yes Tanda Rockers, MD  TRULICITY 1.5 HU/7.6LY SOPN Inject 1.5 mg into the skin every Sunday.  10/10/16  Yes [provider]  acetaminophen (TYLENOL) 325 MG tablet Take 2 tablets (650 mg total) by mouth every 4  (four) hours as needed for headache or mild pain. Patient not taking: Reported on 07/10/2018 10/14/17   Isaiah Serge, NP  atorvastatin (LIPITOR) 80 MG tablet Take 1 tablet (80 mg total) by mouth daily at 6 PM. Patient not taking: Reported on 07/10/2018 10/14/17   Isaiah Serge, NP  benzonatate (TESSALON) 100 MG capsule Take 1 capsule (100 mg total) by mouth every 8 (eight) hours. Patient not taking: Reported on 07/10/2018 06/03/18   Domenic Moras, PA-C  cephALEXin (KEFLEX) 500 MG capsule Take 1 capsule (500 mg total) by mouth 2 (two) times daily. Patient not taking: Reported on 07/10/2018 05/13/18   Orpah Greek, MD  HYDROcodone-acetaminophen (NORCO/VICODIN) 5-325 MG tablet Take 1 tablet by mouth every 4 (four) hours as needed for moderate pain. Patient not taking: Reported on 06/03/2018 05/13/18   Orpah Greek, MD  torsemide (DEMADEX) 20 MG tablet Take 60 mg in the morning and 40 mg in the evening daily. Patient not taking: Reported on 07/10/2018 02/02/18   Park Liter, MD    Physical Exam: Vitals:   07/10/18 0300 07/10/18 0310 07/10/18 0437 07/10/18 0700  BP:  (!) 103/56 116/71 129/70  Pulse: 89 93 92 93  Resp: 17 13 17 15   Temp:  98.6 F (37 C) 98.4 F (36.9 C) 98.6 F (37 C)  TempSrc:  Oral Oral Oral  SpO2: 100% 100% 94% 95%  Weight:      Height:       General: Not in acute distress HEENT:       Eyes: PERRL, EOMI, no scleral icterus.       ENT: No discharge from the ears and nose, no pharynx injection, no tonsillar enlargement.        Neck: No JVD, no bruit, no mass felt. Heme: No neck lymph node enlargement. Cardiac: S1/S2, RRR, No murmurs, No gallops or rubs. Respiratory: has diffused wheezing bilaterally GI: Soft, nondistended, has tenderness in central abdomen. Has a old large surgical scar, no rebound pain, no organomegaly, BS present. GU: No hematuria Ext: has trace leg edema bilaterally. 2+DP/PT pulse bilaterally. Musculoskeletal: No joint  deformities, No joint redness or warmth, no limitation of ROM in spin. Skin: No rashes.  Neuro: Alert, oriented X3, cranial nerves II-XII grossly intact, moves all extremities normally. Psych: Patient is not psychotic, no suicidal or hemocidal ideation.   Labs on Admission: I have personally reviewed following labs and imaging studies  CBC: Recent Labs  Lab 07/10/18 0206  WBC 11.2*  NEUTROABS 8.1*  HGB 12.3  HCT 39.4  MCV 95.6  PLT 678   Basic Metabolic Panel: Recent Labs  Lab 07/10/18 0206 07/10/18 0515  NA 138  --   K 6.2* 4.2  CL 103  --   CO2 29  --   GLUCOSE 58*  --   BUN 15  --   CREATININE 1.15*  --   CALCIUM 9.3  --    GFR: Estimated Creatinine Clearance: 56.4 mL/min (A) (by C-G formula based on SCr of 1.15 mg/dL (H)). Liver Function Tests: Recent Labs  Lab 07/10/18 0206  AST 57*  ALT 27  ALKPHOS 99  BILITOT 1.3*  PROT 6.8  ALBUMIN 3.0*   Recent Labs  Lab 07/10/18 0206  LIPASE 29   No results for input(s): AMMONIA in the last 168 hours. Coagulation Profile: No results for input(s): INR, PROTIME in the last 168 hours. Cardiac Enzymes: No results for input(s): CKTOTAL, CKMB, CKMBINDEX, TROPONINI in the last 168 hours. BNP (last 3 results) Recent Labs    01/31/18 1230 03/15/18 1025  PROBNP 1,553* 858*   HbA1C: No results for input(s): HGBA1C in the last 72 hours. CBG: Recent Labs  Lab 07/10/18 0605  GLUCAP 77   Lipid Profile: No results for input(s): CHOL, HDL, LDLCALC, TRIG, CHOLHDL, LDLDIRECT in the last 72 hours. Thyroid Function Tests: No results for input(s): TSH, T4TOTAL, FREET4, T3FREE, THYROIDAB in the last 72 hours. Anemia Panel: No results for input(s): VITAMINB12, FOLATE,  FERRITIN, TIBC, IRON, RETICCTPCT in the last 72 hours. Urine analysis:    Component Value Date/Time   COLORURINE YELLOW 05/12/2018 2217   APPEARANCEUR CLEAR 05/12/2018 2217   LABSPEC 1.011 05/12/2018 2217   PHURINE 5.0 05/12/2018 2217   GLUCOSEU  NEGATIVE 05/12/2018 2217   HGBUR SMALL (A) 05/12/2018 2217   [place] NEGATIVE 05/12/2018 2217   [place] NEGATIVE 05/12/2018 2217   PROTEINUR NEGATIVE 05/12/2018 2217   NITRITE NEGATIVE 05/12/2018 2217   LEUKOCYTESUR TRACE (A) 05/12/2018 2217   Sepsis Labs: @LABRCNTIP (procalcitonin:4,lacticidven:4) )No results found for this or any previous visit (from the past 240 hour(s)).   Radiological Exams on Admission: Dg Chest 2 View  Result Date: 07/10/2018 CLINICAL DATA:  Increasing shortness of Breath EXAM: CHEST - 2 VIEW COMPARISON:  06/03/2018 FINDINGS: Cardiac shadow is stable. Aortic calcifications are noted and stable. Lungs are well aerated bilaterally. Mild scarring is again seen in left base. No focal infiltrate or effusion is noted. No bony abnormality is noted. Postsurgical changes in the cervical spine are noted. IMPRESSION: Mild left basilar scarring is noted. Electronically Signed   By: Inez Catalina M.D.   On: 07/10/2018 01:03     EKG: Independently reviewed.  Not done in ED, will get one.   Assessment/Plan Principal Problem:   Acute on chronic respiratory failure with hypoxia and hypercapnia (HCC) Active Problems:   Adrenal insufficiency (HCC)   Hyperlipemia   Hypothyroid   Major depressive disorder, recurrent episode (HCC)   CAD (coronary artery disease)   Cigarette smoker   Hypoglycemia   Abdominal pain   Alcohol use   Type II diabetes mellitus with renal manifestations (HCC)   Chronic kidney disease (CKD), stage II (mild)   Chronic diastolic CHF (congestive heart failure) (HCC)   COPD exacerbation (HCC)   Suspected Covid-19 Virus Infection   Acute on chronic respiratory failure with hypoxia and hypercapnia due to COPD exacerbation: Patient has a worsening shortness of breath, productive cough, wheezing on auscultation, clinically consistent with COPD exacerbation.  Chest x-ray did not show infiltration.  Since patient has increased oxygen requirement and did  not respond to outpatient antibiotic treatment, will rule out COVID-19.  -will admit patient to telemetry bed  -Atrovent and prn albuterol inhaler -Solu-Medrol 60 mg IV tid (pt has hx of adrenal insufficiency, will have to use steroid. Tis also serve as stress dose) -continue azithromycin -Mucinex for cough  -Incentive spirometry -Follow up sputum culture, respiratory virus panel, Flu pcr -Nasal cannula oxygen as needed to maintain O2 saturation 93% or greater  Adrenal insufficiency: Patient is taking Cortef and methylprednisolone at home -will continue Cortef -Hold methylprednisolone -Patient is on Solu-Medrol 60 mg 3 times daily which is also for COPD exacerbation  Hyperlipemia: -lipitor  Hypothyroid: -continue Synthroid  Hx of CAD (coronary artery disease): Patient states that she has chest pain, but he pointed to the central abdomen, actually she seems to have abdominal pain rather than chest pain. -Continue Plavix, PRN nitroglycerin and Lipitor  Tobacco abuse and Alcohol use: -Did counseling about importance of quitting smoking and drinking alcohol -Nicotine patch -CIWA protocol  Hypoglycemia: Blood sugar 58, possibly due to decreased oral intake and continuation of insulin Tresiba and Trulicity -As needed G40 -check CBG q1h x 3  Type II diabetes mellitus with renal manifestations: Last A1c 10.5 on 09/16/17, poorly controled. Patient is taking [place] and Trulicity at home.  Patient has hypoglycemia currently. -Hold Tyler Aas -SSI  Abdominal pain: Patient has central abdominal pain.  Etiology is not  clear.  Lipase normal 29. -PRN Zofran for nausea and morphine for pain -Follow-up CT abdomen/pelvis -keep pt NPO until CT-abd/pelvis is done   Chronic kidney disease (CKD), stage II (mild): stable.  Baseline creatinine 1.0-1.1.  Her creatinine is 1.15, BUN 15. -Follow-up renal function by BMP  Chronic diastolic CHF (congestive heart failure) ([place]): 2D echo on 10/19/2017  showed EF 60-65% with grade 1 diastolic dysfunction.  Patient has trace leg edema, but no pulmonary edema chest x-ray.  Said to have seem to be compensated. To continue home dose Lasix.    Suspected Covid-19 Virus Infection: pt has worsening shortness of breath, did not respond to outpatient antibiotic treatment.  She has increased oxygen demand, will check COVID19.  Physician PPE: I used Capr, gown, glove Patient PPE: mask   Fever: no Cough: no SOB: yes URI symptoms: yes GI symptoms: yes Travel: no Sick contacts: no CBC: leukopenia, lymphopenia-->no BMP: increased BUN/Cr=15/1.15 LFTs: increased AST/ALT/Tbili-->no CRP, LDH: increased ?-->pending Procalcitonin: low-->not down CXR: hazy bilateral peripheral opacities-->no CT chest: GGO, consolidation, crazy paving-->not done  COVID subjective risk assessment: low risk COVID Testing: indicated per current ID/[place]era guidelines-->yes Precaution: Droplet and contact      Inpatient status:  # Patient requires inpatient status due to high intensity of service, high risk for further deterioration and high frequency of surveillance required.  I certify that at the point of admission it is my clinical judgment that the patient will require inpatient hospital care spanning beyond 2 midnights from the point of admission.  . This patient has multiple chronic comorbidities including COPD on 2 L nasal cannula oxygen, hypertension, hyperlipidemia, diabetes mellitus, hypothyroidism, depression, CHF, CAD, CKD-2, tobacco abuse, adrenal insufficiency, alcohol use .  Marland Kitchen Now patient has presenting with COPD exacerbation and abdominal pain . The worrisome physical exam findings include diffuse wheezing on auscultation and abdominal tenderness in the central abdomen . Current medical needs: please see my assessment and plan . Predictability of an adverse outcome (risk): Patient has multiple comorbidities, now presents with COPD exacerbation, which  did not response to outpatient antibiotic treatment.  Patient has increased oxygen requirement.  Will need to rule out COVID-19 now.  Patient also has abdominal pain, pending CT abdomen/pelvis for further evaluation treatment.  Due to multiple comorbidities, patient is at high risk for deteriorating.  Will need to be treated in hospital for at least 2 days.      DVT ppx: SQ Lovenox Code Status: Full code Family Communication: None at bed side.  Disposition Plan:  Anticipate discharge back to previous home environment Consults called: none Admission status:   Inpatient/tele      Date of Service 07/10/2018    [facility] [facility]   If 7PM-7AM, please contact night-coverage [email or web address] Password [facility] 07/10/2018, 7:03 AM

## 2018-07-10 NOTE — ED Notes (Signed)
Bed: WA07 Expected date:  Expected time:  Means of arrival:  Comments: 

## 2018-07-10 NOTE — ED Notes (Signed)
cbg 77

## 2018-07-10 NOTE — ED Triage Notes (Addendum)
Pt c/o "increased SOB, received erythromycin from Dr. Melvyn Novas.  He told me if I didn't get better I should come to the hospital.  I told him I had diarrhea a little bit yesterday.  I have belly pain where I have the mesh and maybe that's part of the problem."

## 2018-07-10 NOTE — Progress Notes (Signed)
PHARMACIST - PHYSICIAN ORDER COMMUNICATION  CONCERNING: P&T Medication Policy on Herbal Medications  DESCRIPTION:  This patient's order for: Fusion Plus capsules (Combination of Iron/Folic acid/B complex/Biotin/Probiotic)  has been noted. Patient already has a daily Multivitamin ordered.   This product(s) is classified as an "herbal" or natural product. Due to a lack of definitive safety studies or FDA approval, nonstandard manufacturing practices, plus the potential risk of unknown drug-drug interactions while on inpatient medications, the Pharmacy and Therapeutics Committee does not permit the use of "herbal" or natural products of this type within [facility].   ACTION TAKEN: The pharmacy department is unable to verify this order at this time and your patient has been informed of this safety policy. Please reevaluate patient's clinical condition at discharge and address if the herbal or natural product(s) should be resumed at that time.  Minda Ditto PharmD Pager [phone] 07/10/2018, 8:55 AM

## 2018-07-10 NOTE — ED Notes (Signed)
Bed: FX58 Expected date:  Expected time:  Means of arrival:  Comments: 110 am door closed

## 2018-07-11 ENCOUNTER — Inpatient Hospital Stay (HOSPITAL_COMMUNITY): Payer: Federal, State, Local not specified - PPO

## 2018-07-11 DIAGNOSIS — I5032 Chronic diastolic (congestive) heart failure: Secondary | ICD-10-CM | POA: Diagnosis not present

## 2018-07-11 DIAGNOSIS — J441 Chronic obstructive pulmonary disease with (acute) exacerbation: Secondary | ICD-10-CM | POA: Diagnosis not present

## 2018-07-11 DIAGNOSIS — E2740 Unspecified adrenocortical insufficiency: Secondary | ICD-10-CM

## 2018-07-11 DIAGNOSIS — J9621 Acute and chronic respiratory failure with hypoxia: Secondary | ICD-10-CM | POA: Diagnosis not present

## 2018-07-11 DIAGNOSIS — E039 Hypothyroidism, unspecified: Secondary | ICD-10-CM

## 2018-07-11 DIAGNOSIS — N200 Calculus of kidney: Secondary | ICD-10-CM | POA: Diagnosis not present

## 2018-07-11 LAB — BLOOD CULTURE ID PANEL (REFLEXED)

## 2018-07-11 LAB — COMPREHENSIVE METABOLIC PANEL
ALT: 17 U/L (ref 0–44)
AST: 17 U/L (ref 15–41)
Albumin: 2.6 g/dL — ABNORMAL LOW (ref 3.5–5.0)
Alkaline Phosphatase: 90 U/L (ref 38–126)
Anion gap: 7 (ref 5–15)
BUN: 31 mg/dL — ABNORMAL HIGH (ref 8–23)
CO2: 27 mmol/L (ref 22–32)
Calcium: 8.7 mg/dL — ABNORMAL LOW (ref 8.9–10.3)
Chloride: 100 mmol/L (ref 98–111)
Creatinine, Ser: 1.11 mg/dL — ABNORMAL HIGH (ref 0.44–1.00)
GFR calc Af Amer: >60 mL/min (ref >60)
GFR calc non Af Amer: 53 mL/min — ABNORMAL LOW (ref >60)
Glucose, Bld: 378 mg/dL — ABNORMAL HIGH (ref 70–99)
Potassium: 4.8 mmol/L (ref 3.5–5.1)
Sodium: 134 mmol/L — ABNORMAL LOW (ref 135–145)
Total Bilirubin: 0.5 mg/dL (ref 0.3–1.2)
Total Protein: 6.2 g/dL — ABNORMAL LOW (ref 6.5–8.1)

## 2018-07-11 LAB — CBC
HCT: 36.6 % (ref 36.0–46.0)
Hemoglobin: 11.3 g/dL — ABNORMAL LOW (ref 12.0–15.0)
MCH: 29.9 pg (ref 26.0–34.0)
MCHC: 30.9 g/dL (ref 30.0–36.0)
MCV: 96.8 fL (ref 80.0–100.0)
Platelets: 164 10*3/uL (ref 150–400)
RBC: 3.78 MIL/uL — ABNORMAL LOW (ref 3.87–5.11)
RDW: 15.2 % (ref 11.5–15.5)
WBC: 12 10*3/uL — ABNORMAL HIGH (ref 4.0–10.5)
nRBC: 0 % (ref 0.0–0.2)

## 2018-07-11 LAB — GLUCOSE, CAPILLARY
Glucose-Capillary: 371 mg/dL — ABNORMAL HIGH (ref 70–99)
Glucose-Capillary: 214 mg/dL — ABNORMAL HIGH (ref 70–99)
Glucose-Capillary: 251 mg/dL — ABNORMAL HIGH (ref 70–99)

## 2018-07-11 MED ORDER — IOHEXOL 300 MG/ML  SOLN: 30.0000 mL | Freq: Once | INTRAMUSCULAR | Status: DC | PRN

## 2018-07-11 MED ORDER — ACETAMINOPHEN 500 MG PO TABS: 500.0000 mg | ORAL_TABLET | Freq: Four times a day (QID) | ORAL | 0 refills | Status: AC | PRN

## 2018-07-11 MED ORDER — FUROSEMIDE 40 MG PO TABS: 60.0000 mg | ORAL_TABLET | Freq: Every day | ORAL | Status: DC

## 2018-07-11 NOTE — Progress Notes (Signed)
Flutter ordered to assist in Pulmonary Toilet/Sputum Expectoration-Pt. followed by Dr. Lyla Son.

## 2018-07-11 NOTE — Consult Note (Signed)
   [facility] CM Inpatient Consult   07/11/2018  Crystal Yates February 12, 1956 [id number]   Referral received from inpatient [facility] for possible engagement with [name] services due to patient unplanned readmission risk score, 53%, extreme. Chart review reveals patient is followed by Dr. Dortha Kern as primary physician.   Attempted to contact patient by telephone with no answer at this time. Will attempt to speak with patient at a later time. Will continue to follow for progression and disposition plan.  Of note, [facility] Care Management services does not replace or interfere with any services that are arranged by inpatient case management or social work.  Netta Cedars, MSN, [facility] Liaison Nurse Mobile Phone [phone]  Toll free office [phone]

## 2018-07-11 NOTE — Discharge Summary (Addendum)
Physician Discharge Summary  Crystal Yates ZSW:[id number] DOB: 03-22-1956 DOA: 07/10/2018  PCP: Dortha Kern, PA  Admit date: 07/10/2018 Discharge date: 07/11/2018  Admitted From: Home Disposition: Home  Recommendations for Outpatient Follow-up:  1. Follow up with PCP in 1 week 2. Please obtain BMP/CBC in one week 3. Please follow up on the following pending results: None  Home Health: None Equipment/Devices: None  Discharge Condition: Stable CODE STATUS: Full code Diet recommendation: Heart healthy/carb modified   Brief/Interim Summary:  Admission HPI written by Ivor Costa, MD   Chief Complaint: SOB, cough and abdominal pain  HPI: Crystal Yates is a 63 y.o. female with medical history significant of COPD on 2 L nasal cannula oxygen, hypertension, hyperlipidemia, diabetes mellitus, hypothyroidism, depression, CHF, CAD, CKD-2, tobacco abuse, adrenal insufficiency, alcohol use, who presents with shortness of breath, cough and abdominal pain.  Patient states that she has been having shortness of breath and cough for several days, which has been progressively getting worse.  She coughs up little yellow-colored sputum.  Denies fever or chills.  Patient states that she has chest pain, but pointing to the central abdomen, actually she does not have chest pain.  Her abdominal pain is constant, 8 out of 10 in severity, located in central abdomen, sharp, nonradiating.  She states that she has history of hernia repair, and her current abdominal pain is located in the same area.  She has nausea, no vomiting.  She states that she has intermittent loose stool bowel movement, but she is taking MiraLAX at home.  Denies symptoms of UTI or unilateral weakness.  Patient was seen by her pulmonologist, Dr. Marlene Lard and was given prescription of azithromycin on 4/13, without significant help. Of note, she is using 2 L nasal cannula oxygen at home, but now has increased oxygen  requirement up to 3 L.  Her oxygen saturation 94% on 3 L oxygen now.  She states that her husband needs to go outside to work, but states that her husband does not seem to have COVID-19.  She does not have contact with person who is known to have COVID-19.  ED Course: pt was found to have WBC 11.2, BMP to 2.8, lipase 29, potassium 6.2 with hemolysis-->4.2 on repeated K level, stable renal function, temperature 98.4, heart rate 80-90s, tachypnea, chest x-ray showed mild left sided scarring without infiltration.  Patient is admitted to telemetry bed as inpatient.   Hospital course:  Acute on chronic respiratory failure with hypoxia/hypercapnia Secondary to COPD exacerbation. Back to baseline oxygen prior to discharge. Initial concern for possible COVID-19 infection. Test negative. Symptoms improved with treatment of COPD exacerbation. Low suspicion for COVID-19 infection on discharge.  COPD exacerbation Improved with steroids. Discharged with instructions to continue outpatient regimen of azithromycin, Medrol and bronchodilators  Adrenal insufficiency Continued Cortef  Hyperlipidemia Lipitor  Hypothyroidism Continue Synthroid  History of CAD Continue Plavix  Tobacco abuse Counseled on admission. Nicotine patch while inpatient  Alcohol use On CIWA while inpatient  Hypoglycemia Transient. Thought secondary to poor oral intake. Resolved.  Diabetes mellitus, type II Continue home regimen of Tresiba and Trulicity  Abdominal pain CT abdomen/pelvis unremarkable. Outpatient follow-up  CKD stage II Stable.  Chronic diastolic CHF Stable. Continue lasix.  **Addendum** Incidental 6 mm nodular opacity at the left lung base noted on CT abdomen/pelvis and will need surveillance.  Discharge Diagnoses:  Principal Problem:   Acute on chronic respiratory failure with hypoxia and hypercapnia (HCC) Active Problems:   Adrenal insufficiency (  [place])   Hyperlipemia   Hypothyroid   Major  depressive disorder, recurrent episode (HCC)   CAD (coronary artery disease)   Cigarette smoker   Hypoglycemia   Abdominal pain   Alcohol use   Type II diabetes mellitus with renal manifestations (HCC)   Chronic kidney disease (CKD), stage II (mild)   Chronic diastolic CHF (congestive heart failure) (HCC)   COPD exacerbation ([place])   Suspected Covid-19 Virus Infection    Discharge Instructions  Discharge Instructions    Call MD for:  difficulty breathing, headache or visual disturbances   Complete by:  As directed    Diet - low sodium heart healthy   Complete by:  As directed    Increase activity slowly   Complete by:  As directed      Allergies as of 07/11/2018      Reactions   Hydroxychloroquine Shortness Of Breath, Nausea Only, Other (See Comments)   Dizziness   Donepezil Other (See Comments)   Dizziness, depression, and makes the patient feel "funny"   Prednisone Other (See Comments)   Causes depression and suicidal thoughts   Anticoagulant Cit Dext [acd Formula A] Other (See Comments)   Unknown   Bupropion Other (See Comments)   Suicidal thoughts   Metrizamide Other (See Comments)   (a non-ionic radiopaque contrast agent) "Blows the vein" and contrast gathers at the injected site's limb   Varenicline Other (See Comments)   Suicidal thoughts   Tape Rash, Other (See Comments)   Paper tape is preferred, PLEASE      Medication List    STOP taking these medications   cephALEXin 500 MG capsule Commonly known as:  KEFLEX   HYDROcodone-acetaminophen 5-325 MG tablet Commonly known as:  NORCO/VICODIN   torsemide 20 MG tablet Commonly known as:  DEMADEX     TAKE these medications   acetaminophen 500 MG tablet Commonly known as:  TYLENOL Take 1 tablet (500 mg total) by mouth every 6 (six) hours as needed for mild pain. What changed:    how much to take  Another medication with the same name was removed. Continue taking this medication, and follow the  directions you see here.   albuterol 108 (90 Base) MCG/ACT inhaler Commonly known as:  ProAir HFA Inhale 2 puffs into the lungs every 4-6 hours as needed for shortness of breath or wheezing   albuterol (2.5 MG/3ML) 0.083% nebulizer solution Commonly known as:  PROVENTIL Take 3 mLs (2.5 mg total) by nebulization every 6 (six) hours as needed for wheezing or shortness of breath.   atorvastatin 20 MG tablet Commonly known as:  LIPITOR Take 20 mg by mouth every evening. What changed:  Another medication with the same name was removed. Continue taking this medication, and follow the directions you see here.   azithromycin 250 MG tablet Commonly known as:  ZITHROMAX Take 1 tablet (250 mg total) by mouth as directed. What changed:    how much to take  when to take this  additional instructions   Belsomra 20 MG Tabs Generic drug:  Suvorexant Take 20 mg by mouth at bedtime.   benzonatate 100 MG capsule Commonly known as:  TESSALON Take 1 capsule (100 mg total) by mouth every 8 (eight) hours.   CALCIUM PO Take 1 tablet by mouth daily.   clopidogrel 75 MG tablet Commonly known as:  PLAVIX Take 1 tablet (75 mg total) by mouth daily. What changed:  when to take this   DULoxetine 60 MG capsule  Commonly known as:  CYMBALTA Take 60 mg by mouth daily.   Emgality 120 MG/ML Sosy Generic drug:  Galcanezumab-gnlm Inject 120 mg into the skin every 30 (thirty) days.   furosemide 20 MG tablet Commonly known as:  LASIX Take 60 mg by mouth daily.   Fusion Plus Caps Take 1 capsule by mouth at bedtime.   hydrocortisone 2.5 % rectal cream Commonly known as:  ANUSOL-HC Place rectally 2 (two) times daily. What changed:    how much to take  when to take this  reasons to take this   hydrocortisone 5 MG tablet Commonly known as:  CORTEF Take 5-15 mg by mouth See admin instructions. Take 15 mg in the morning and 5 mg in the evening   levothyroxine 150 MCG tablet Commonly known  as:  SYNTHROID, LEVOTHROID Take 150 mcg by mouth daily before breakfast.   MAGnesium-Oxide 400 (241.3 Mg) MG tablet Generic drug:  magnesium oxide Take 400 mg by mouth daily.   methylPREDNISolone 8 MG tablet Commonly known as:  Medrol Take 1 tablet (8 mg total) by mouth daily.   nitroGLYCERIN 0.4 MG SL tablet Commonly known as:  NITROSTAT Place 0.4 mg under the tongue every 5 (five) minutes as needed for chest pain.   OXYGEN Place 2 L into the nose continuous.   polyethylene glycol 17 g packet Commonly known as:  MIRALAX / GLYCOLAX Take 17 g by mouth daily. What changed:    when to take this  reasons to take this   potassium chloride SA 20 MEQ tablet Commonly known as:  K-DUR,KLOR-CON Take 1 tablet (20 mEq total) by mouth daily. Take when you take Torsemide.   Tiotropium Bromide-Olodaterol 2.5-2.5 MCG/ACT Aers Commonly known as:  Stiolto Respimat Inhale 2 puffs into the lungs daily.   Tyler Aas FlexTouch 100 UNIT/ML Sopn FlexTouch Pen Generic drug:  insulin degludec Inject 42 Units into the skin at bedtime.   Trulicity 1.5 DG/3.8VF Sopn Generic drug:  Dulaglutide Inject 1.5 mg into the skin every Sunday.   Vitamin D3 50 MCG (2000 UT) capsule Take 2,000 Units by mouth daily.      Follow-up Information    Dortha Kern, [place]. Schedule an appointment as soon as possible for a visit in 1 week(s).   Specialty:  Physician Assistant Contact information: [address]  [address] [place] [address] [phone]          Allergies  Allergen Reactions   Hydroxychloroquine Shortness Of Breath, Nausea Only and Other (See Comments)    Dizziness   Donepezil Other (See Comments)    Dizziness, depression, and makes the patient feel "funny"   Prednisone Other (See Comments)    Causes depression and suicidal thoughts   Anticoagulant Cit Dext [Acd Formula A] Other (See Comments)    Unknown   Bupropion Other (See Comments)    Suicidal thoughts   Metrizamide Other  (See Comments)    (a non-ionic radiopaque contrast agent) "Blows the vein" and contrast gathers at the injected site's limb   Varenicline Other (See Comments)    Suicidal thoughts   Tape Rash and Other (See Comments)    Paper tape is preferred, PLEASE    Consultations:  None   Procedures/Studies: Ct Abdomen Pelvis Wo Contrast  Result Date: 07/11/2018 CLINICAL DATA:  Abdominal pain EXAM: CT ABDOMEN AND PELVIS WITHOUT CONTRAST TECHNIQUE: Multidetector CT imaging of the abdomen and pelvis was performed following the standard protocol without IV contrast. COMPARISON:  CT abdomen pelvis 05/13/2018 FINDINGS: LOWER CHEST:  6 mm nodular opacity at the left lung base. Bibasilar atelectasis. HEPATOBILIARY: The hepatic contours and density are normal. There is no intra- or extrahepatic biliary dilatation. Status post cholecystectomy. PANCREAS: The pancreatic parenchymal contours are normal and there is no ductal dilatation. There is no peripancreatic fluid collection. SPLEEN: Normal. ADRENALS/URINARY TRACT: --Adrenal glands: Normal. --Right kidney/ureter: No hydronephrosis, nephroureterolithiasis, perinephric stranding or solid renal mass. --Left kidney/ureter: Single nonobstructing renal calculus measuring 4 mm. No hydronephrosis, perinephric stranding or solid renal mass. --Urinary bladder: Normal for degree of distention STOMACH/BOWEL: --Stomach/Duodenum: There is no hiatal hernia or other gastric abnormality. The duodenal course and caliber are normal. --Small bowel: There is a ventral abdominal hernia that contains a small loop of unobstructed small bowel. --Colon: No focal abnormality. --Appendix: Not visualized. No right lower quadrant inflammation or free fluid. VASCULAR/LYMPHATIC: There is aortic atherosclerosis without hemodynamically significant stenosis. No abdominal or pelvic lymphadenopathy. REPRODUCTIVE: Normal uterus and ovaries. MUSCULOSKELETAL. No bony spinal canal stenosis or focal osseous  abnormality. OTHER: No radiopaque hernia repair mesh is visualized. IMPRESSION: 1. No acute abnormality of the abdomen or pelvis. 2. Nonobstructive left nephrolithiasis measuring 4 mm. 3. Ventral abdominal hernia containing a small loop of unobstructed small bowel, unchanged. No radiopaque hernia repair mesh. 4. 6 mm nodular opacity at the left lung base, not visible previously. Non-contrast chest CT at 6-12 months is recommended. If the nodule is stable at time of repeat CT, then future CT at 18-24 months (from today's scan) is considered optional for low-risk patients, but is recommended for high-risk patients. This recommendation follows the consensus statement: Guidelines for Management of Incidental Pulmonary Nodules Detected on CT Images: From the Fleischner Society 2017; Radiology 2017; 284:228-243. Electronically Signed   By: Ulyses Jarred M.D.   On: 07/11/2018 16:37   Dg Chest 2 View  Result Date: 07/10/2018 CLINICAL DATA:  Increasing shortness of Breath EXAM: CHEST - 2 VIEW COMPARISON:  06/03/2018 FINDINGS: Cardiac shadow is stable. Aortic calcifications are noted and stable. Lungs are well aerated bilaterally. Mild scarring is again seen in left base. No focal infiltrate or effusion is noted. No bony abnormality is noted. Postsurgical changes in the cervical spine are noted. IMPRESSION: Mild left basilar scarring is noted. Electronically Signed   By: Inez Catalina M.D.   On: 07/10/2018 01:03      Subjective: Feeling better today. Breathing better. Some abdominal pain.  Discharge Exam: Vitals:   07/11/18 0752 07/11/18 1240  BP:    Pulse:  97  Resp:  18  Temp:  97.9 F (36.6 C)  SpO2: 96% 97%   Vitals:   07/11/18 0018 07/11/18 0527 07/11/18 0752 07/11/18 1240  BP:  126/70    Pulse:  98  97  Resp:  16  18  Temp:  97.7 F (36.5 C)  97.9 F (36.6 C)  TempSrc:  Oral  Oral  SpO2: 95% 98% 96% 97%  Weight:      Height:        General: Pt is alert, awake, not in acute  distress Cardiovascular: RRR, S1/S2 +, no rubs, no gallops Respiratory: CTA bilaterally, no wheezing, no rhonchi Abdominal: Soft, mild upper abdominal tenderness, ND, bowel sounds + Extremities: no edema, no cyanosis    The results of significant diagnostics from this hospitalization (including imaging, microbiology, ancillary and laboratory) are listed below for reference.     Microbiology: Recent Results (from the past 240 hour(s))  Culture, blood (routine x 2) Call MD if unable to obtain prior to  antibiotics being given     Status: None (Preliminary result)   Collection Time: 07/10/18  6:22 AM  Result Value Ref Range Status   Specimen Description   Final    BLOOD LEFT ANTECUBITAL Performed at [facility] [address], [place], [place] [address]    Special Requests   Final    BOTTLES DRAWN AEROBIC AND ANAEROBIC Blood Culture adequate volume Performed at [facility] [address], [place], [place] [address]    Culture   Final    NO GROWTH 1 DAY Performed at [facility], [place] [address], [place], [place] [address]    Report Status PENDING  Incomplete  Culture, blood (routine x 2) Call MD if unable to obtain prior to antibiotics being given     Status: None (Preliminary result)   Collection Time: 07/10/18  7:20 AM  Result Value Ref Range Status   Specimen Description   Final    BLOOD LEFT HAND Performed at [facility] [address] [place], [place] [address]    Special Requests   Final    BOTTLES DRAWN AEROBIC AND ANAEROBIC Blood Culture adequate volume Performed at [facility] [address] [place], [place] [address]    Culture  Setup Time   Final    ANAEROBIC BOTTLE ONLY GRAM POSITIVE COCCI CRITICAL RESULT CALLED TO, READ BACK BY AND VERIFIED WITH: Novella Rob [id number] 07/11/2018 Mena Goes Performed at [facility], [place] [address] [place], [place] [address]    Culture  GRAM POSITIVE COCCI  Final   Report Status PENDING  Incomplete  Blood Culture ID Panel (Reflexed)     Status: Abnormal   Collection Time: 07/10/18  7:20 AM  Result Value Ref Range Status   Enterococcus species NOT DETECTED NOT DETECTED Final   Listeria monocytogenes NOT DETECTED NOT DETECTED Final   Staphylococcus species DETECTED (A) NOT DETECTED Final    Comment: Methicillin (oxacillin) susceptible coagulase negative staphylococcus. Possible blood culture contaminant (unless isolated from more than one blood culture draw or clinical case suggests pathogenicity). No antibiotic treatment is indicated for blood  culture contaminants. CRITICAL RESULT CALLED TO, READ BACK BY AND VERIFIED WITH: B. GREEN,PHARMD [id number] 07/11/2018 T. TYSOR    Staphylococcus aureus (BCID) NOT DETECTED NOT DETECTED Final   Methicillin resistance NOT DETECTED NOT DETECTED Final   Streptococcus species NOT DETECTED NOT DETECTED Final   Streptococcus agalactiae NOT DETECTED NOT DETECTED Final   Streptococcus pneumoniae NOT DETECTED NOT DETECTED Final   Streptococcus pyogenes NOT DETECTED NOT DETECTED Final   Acinetobacter baumannii NOT DETECTED NOT DETECTED Final   Enterobacteriaceae species NOT DETECTED NOT DETECTED Final   Enterobacter cloacae complex NOT DETECTED NOT DETECTED Final   Escherichia coli NOT DETECTED NOT DETECTED Final   Klebsiella oxytoca NOT DETECTED NOT DETECTED Final   Klebsiella pneumoniae NOT DETECTED NOT DETECTED Final   Proteus species NOT DETECTED NOT DETECTED Final   Serratia marcescens NOT DETECTED NOT DETECTED Final   Haemophilus influenzae NOT DETECTED NOT DETECTED Final   Neisseria meningitidis NOT DETECTED NOT DETECTED Final   Pseudomonas aeruginosa NOT DETECTED NOT DETECTED Final   Candida albicans NOT DETECTED NOT DETECTED Final   Candida glabrata NOT DETECTED NOT DETECTED Final   Candida krusei NOT DETECTED NOT DETECTED Final   Candida parapsilosis NOT DETECTED NOT DETECTED Final    Candida tropicalis NOT DETECTED NOT DETECTED Final    Comment: Performed at [facility], [address], [place], [place] [address]  Respiratory Panel by PCR     Status: None   Collection Time: 07/10/18  9:30 AM  Result Value Ref Range Status   Adenovirus NOT DETECTED NOT DETECTED Final   Coronavirus 229E NOT DETECTED NOT DETECTED Final    Comment: (NOTE) The Coronavirus on the Respiratory Panel, DOES NOT test for the novel  Coronavirus (2019 nCoV)    Coronavirus HKU1 NOT DETECTED NOT DETECTED Final   Coronavirus NL63 NOT DETECTED NOT DETECTED Final   Coronavirus OC43 NOT DETECTED NOT DETECTED Final   Metapneumovirus NOT DETECTED NOT DETECTED Final   Rhinovirus / Enterovirus NOT DETECTED NOT DETECTED Final   Influenza A NOT DETECTED NOT DETECTED Final   Influenza B NOT DETECTED NOT DETECTED Final   Parainfluenza Virus 1 NOT DETECTED NOT DETECTED Final   Parainfluenza Virus 2 NOT DETECTED NOT DETECTED Final   Parainfluenza Virus 3 NOT DETECTED NOT DETECTED Final   Parainfluenza Virus 4 NOT DETECTED NOT DETECTED Final   Respiratory Syncytial Virus NOT DETECTED NOT DETECTED Final   Bordetella pertussis NOT DETECTED NOT DETECTED Final   Chlamydophila pneumoniae NOT DETECTED NOT DETECTED Final   Mycoplasma pneumoniae NOT DETECTED NOT DETECTED Final    Comment: Performed at [facility], [place]. [address], [place], [place] [address]  SARS Coronavirus 2 [facility] order, Performed in [facility] hospital lab)     Status: None   Collection Time: 07/10/18  9:30 AM  Result Value Ref Range Status   SARS Coronavirus 2 NEGATIVE NEGATIVE Final    Comment: (NOTE) If result is NEGATIVE SARS-CoV-2 target nucleic acids are NOT DETECTED. The SARS-CoV-2 RNA is generally detectable in upper and lower  respiratory specimens during the acute phase of infection. The lowest  concentration of SARS-CoV-2 viral copies this assay can detect is 250  copies / mL. A negative result does not preclude  SARS-CoV-2 infection  and should not be used as the sole basis for treatment or other  patient management decisions.  A negative result may occur with  improper specimen collection / handling, submission of specimen other  than nasopharyngeal swab, presence of viral mutation(s) within the  areas targeted by this assay, and inadequate number of viral copies  (<250 copies / mL). A negative result must be combined with clinical  observations, patient history, and epidemiological information. If result is POSITIVE SARS-CoV-2 target nucleic acids are DETECTED. The SARS-CoV-2 RNA is generally detectable in upper and lower  respiratory specimens dur ing the acute phase of infection.  Positive  results are indicative of active infection with SARS-CoV-2.  Clinical  correlation with patient history and other diagnostic information is  necessary to determine patient infection status.  Positive results do  not rule out bacterial infection or co-infection with other viruses. If result is PRESUMPTIVE POSTIVE SARS-CoV-2 nucleic acids MAY BE PRESENT.   A presumptive positive result was obtained on the submitted specimen  and confirmed on repeat testing.  While 2019 novel coronavirus  (SARS-CoV-2) nucleic acids may be present in the submitted sample  additional confirmatory testing may be necessary for epidemiological  and / or clinical management purposes  to differentiate between  SARS-CoV-2 and other Sarbecovirus currently known to infect humans.  If clinically indicated additional testing with an alternate test  methodology [phone]) is advised. The SARS-CoV-2 RNA is generally  detectable in upper and lower respiratory sp ecimens during the acute  phase of infection. The expected result is Negative. Fact  Sheet for Patients:  StrictlyIdeas.no Fact Sheet for Healthcare Providers: BankingDealers.co.za This test is not yet approved or cleared by the  [place] FDA and has been authorized for detection and/or diagnosis of SARS-CoV-2 by FDA under an Emergency Use Authorization (EUA).  This EUA will remain in effect (meaning this test can be used) for the duration of the COVID-19 declaration under Section 564(b)(1) of the Act, 21 U.S.C. section 360bbb-3(b)(1), unless the authorization is terminated or revoked sooner. Performed at [facility], [place] [address] [place], [place] [address]      Labs: BNP (last 3 results) Recent Labs    05/12/18 2216 06/03/18 1159 07/10/18 0206  BNP 140.3* 331.0* 258.5*   Basic Metabolic Panel: Recent Labs  Lab 07/10/18 0206 07/10/18 0515 07/11/18 0531  NA 138  --  134*  K 6.2* 4.2 4.8  CL 103  --  100  CO2 29  --  27  GLUCOSE 58*  --  378*  BUN 15  --  31*  CREATININE 1.15*  --  1.11*  CALCIUM 9.3  --  8.7*   Liver Function Tests: Recent Labs  Lab 07/10/18 0206 07/11/18 0531  AST 57* 17  ALT 27 17  ALKPHOS 99 90  BILITOT 1.3* 0.5  PROT 6.8 6.2*  ALBUMIN 3.0* 2.6*   Recent Labs  Lab 07/10/18 0206  LIPASE 29   No results for input(s): AMMONIA in the last 168 hours. CBC: Recent Labs  Lab 07/10/18 0206 07/11/18 0531  WBC 11.2* 12.0*  NEUTROABS 8.1*  --   HGB 12.3 11.3*  HCT 39.4 36.6  MCV 95.6 96.8  PLT 204 164   Cardiac Enzymes: No results for input(s): CKTOTAL, CKMB, CKMBINDEX, TROPONINI in the last 168 hours. BNP: Invalid input(s): POCBNP CBG: Recent Labs  Lab 07/10/18 1625 07/10/18 2128 07/11/18 0749 07/11/18 1200 07/11/18 1621  GLUCAP 220* 322* 371* 214* 251*   D-Dimer No results for input(s): DDIMER in the last 72 hours. Hgb A1c No results for input(s): HGBA1C in the last 72 hours. Lipid Profile No results for input(s): CHOL, HDL, LDLCALC, TRIG, CHOLHDL, LDLDIRECT in the last 72 hours. Thyroid function studies No results for input(s): TSH, T4TOTAL, T3FREE, THYROIDAB in the last 72 hours.  Invalid input(s): FREET3 Anemia work up No  results for input(s): VITAMINB12, FOLATE, FERRITIN, TIBC, IRON, RETICCTPCT in the last 72 hours. Urinalysis    Component Value Date/Time   COLORURINE YELLOW 07/10/2018 1644   APPEARANCEUR HAZY (A) 07/10/2018 1644   LABSPEC 1.015 07/10/2018 1644   PHURINE 5.0 07/10/2018 1644   GLUCOSEU NEGATIVE 07/10/2018 1644   HGBUR NEGATIVE 07/10/2018 [place] 07/10/2018 [place] 07/10/2018 1644   PROTEINUR NEGATIVE 07/10/2018 1644   NITRITE NEGATIVE 07/10/2018 1644   LEUKOCYTESUR MODERATE (A) 07/10/2018 1644   Sepsis Labs Invalid input(s): PROCALCITONIN,  WBC,  LACTICIDVEN Microbiology Recent Results (from the past 240 hour(s))  Culture, blood (routine x 2) Call MD if unable to obtain prior to antibiotics being given     Status: None (Preliminary result)   Collection Time: 07/10/18  6:22 AM  Result Value Ref Range Status   Specimen Description   Final    BLOOD LEFT ANTECUBITAL Performed at [facility] [place], [place] [address], [place], [place] [address]    Special Requests   Final    BOTTLES DRAWN AEROBIC AND ANAEROBIC Blood Culture adequate volume Performed at [facility] [address], [place], [place] [address]  Culture   Final    NO GROWTH 1 DAY Performed at [facility], [place] [address] [place], [place] [address]    Report Status PENDING  Incomplete  Culture, blood (routine x 2) Call MD if unable to obtain prior to antibiotics being given     Status: None (Preliminary result)   Collection Time: 07/10/18  7:20 AM  Result Value Ref Range Status   Specimen Description   Final    BLOOD LEFT HAND Performed at [facility] [address] [place], [place] [address]    Special Requests   Final    BOTTLES DRAWN AEROBIC AND ANAEROBIC Blood Culture adequate volume Performed at [facility] [address], [place], [place] [address]    Culture  Setup Time   Final     ANAEROBIC BOTTLE ONLY GRAM POSITIVE COCCI CRITICAL RESULT CALLED TO, READ BACK BY AND VERIFIED WITH: Novella Rob [phone] 07/11/2018 Mena Goes Performed at [facility], [place] [address], [place], [place] [address]    Culture GRAM POSITIVE COCCI  Final   Report Status PENDING  Incomplete  Blood Culture ID Panel (Reflexed)     Status: Abnormal   Collection Time: 07/10/18  7:20 AM  Result Value Ref Range Status   Enterococcus species NOT DETECTED NOT DETECTED Final   Listeria monocytogenes NOT DETECTED NOT DETECTED Final   Staphylococcus species DETECTED (A) NOT DETECTED Final    Comment: Methicillin (oxacillin) susceptible coagulase negative staphylococcus. Possible blood culture contaminant (unless isolated from more than one blood culture draw or clinical case suggests pathogenicity). No antibiotic treatment is indicated for blood  culture contaminants. CRITICAL RESULT CALLED TO, READ BACK BY AND VERIFIED WITH: B. GREEN,PHARMD [id number] 07/11/2018 T. TYSOR    Staphylococcus aureus (BCID) NOT DETECTED NOT DETECTED Final   Methicillin resistance NOT DETECTED NOT DETECTED Final   Streptococcus species NOT DETECTED NOT DETECTED Final   Streptococcus agalactiae NOT DETECTED NOT DETECTED Final   Streptococcus pneumoniae NOT DETECTED NOT DETECTED Final   Streptococcus pyogenes NOT DETECTED NOT DETECTED Final   Acinetobacter baumannii NOT DETECTED NOT DETECTED Final   Enterobacteriaceae species NOT DETECTED NOT DETECTED Final   Enterobacter cloacae complex NOT DETECTED NOT DETECTED Final   Escherichia coli NOT DETECTED NOT DETECTED Final   Klebsiella oxytoca NOT DETECTED NOT DETECTED Final   Klebsiella pneumoniae NOT DETECTED NOT DETECTED Final   Proteus species NOT DETECTED NOT DETECTED Final   Serratia marcescens NOT DETECTED NOT DETECTED Final   Haemophilus influenzae NOT DETECTED NOT DETECTED Final   Neisseria meningitidis NOT DETECTED NOT DETECTED Final   Pseudomonas aeruginosa NOT  DETECTED NOT DETECTED Final   Candida albicans NOT DETECTED NOT DETECTED Final   Candida glabrata NOT DETECTED NOT DETECTED Final   Candida krusei NOT DETECTED NOT DETECTED Final   Candida parapsilosis NOT DETECTED NOT DETECTED Final   Candida tropicalis NOT DETECTED NOT DETECTED Final    Comment: Performed at [facility], [address], [place], [place] [address]  Respiratory Panel by PCR     Status: None   Collection Time: 07/10/18  9:30 AM  Result Value Ref Range Status   Adenovirus NOT DETECTED NOT DETECTED Final   Coronavirus 229E NOT DETECTED NOT DETECTED Final    Comment: (NOTE) The Coronavirus on the Respiratory Panel, DOES NOT test for the novel  Coronavirus (2019 nCoV)    Coronavirus HKU1 NOT DETECTED NOT DETECTED Final   Coronavirus NL63 NOT DETECTED  NOT DETECTED Final   Coronavirus OC43 NOT DETECTED NOT DETECTED Final   Metapneumovirus NOT DETECTED NOT DETECTED Final   Rhinovirus / Enterovirus NOT DETECTED NOT DETECTED Final   Influenza A NOT DETECTED NOT DETECTED Final   Influenza B NOT DETECTED NOT DETECTED Final   Parainfluenza Virus 1 NOT DETECTED NOT DETECTED Final   Parainfluenza Virus 2 NOT DETECTED NOT DETECTED Final   Parainfluenza Virus 3 NOT DETECTED NOT DETECTED Final   Parainfluenza Virus 4 NOT DETECTED NOT DETECTED Final   Respiratory Syncytial Virus NOT DETECTED NOT DETECTED Final   Bordetella pertussis NOT DETECTED NOT DETECTED Final   Chlamydophila pneumoniae NOT DETECTED NOT DETECTED Final   Mycoplasma pneumoniae NOT DETECTED NOT DETECTED Final    Comment: Performed at [facility], [place] [address], [place], [place] [address]  SARS Coronavirus 2 [facility] order, Performed in [facility] hospital lab)     Status: None   Collection Time: 07/10/18  9:30 AM  Result Value Ref Range Status   SARS Coronavirus 2 NEGATIVE NEGATIVE Final    Comment: (NOTE) If result is NEGATIVE SARS-CoV-2 target nucleic acids are NOT DETECTED. The SARS-CoV-2 RNA  is generally detectable in upper and lower  respiratory specimens during the acute phase of infection. The lowest  concentration of SARS-CoV-2 viral copies this assay can detect is 250  copies / mL. A negative result does not preclude SARS-CoV-2 infection  and should not be used as the sole basis for treatment or other  patient management decisions.  A negative result may occur with  improper specimen collection / handling, submission of specimen other  than nasopharyngeal swab, presence of viral mutation(s) within the  areas targeted by this assay, and inadequate number of viral copies  (<250 copies / mL). A negative result must be combined with clinical  observations, patient history, and epidemiological information. If result is POSITIVE SARS-CoV-2 target nucleic acids are DETECTED. The SARS-CoV-2 RNA is generally detectable in upper and lower  respiratory specimens dur ing the acute phase of infection.  Positive  results are indicative of active infection with SARS-CoV-2.  Clinical  correlation with patient history and other diagnostic information is  necessary to determine patient infection status.  Positive results do  not rule out bacterial infection or co-infection with other viruses. If result is PRESUMPTIVE POSTIVE SARS-CoV-2 nucleic acids MAY BE PRESENT.   A presumptive positive result was obtained on the submitted specimen  and confirmed on repeat testing.  While 2019 novel coronavirus  (SARS-CoV-2) nucleic acids may be present in the submitted sample  additional confirmatory testing may be necessary for epidemiological  and / or clinical management purposes  to differentiate between  SARS-CoV-2 and other Sarbecovirus currently known to infect humans.  If clinically indicated additional testing with an alternate test  methodology [phone]) is advised. The SARS-CoV-2 RNA is generally  detectable in upper and lower respiratory sp ecimens during the acute  phase of  infection. The expected result is Negative. Fact Sheet for Patients:  StrictlyIdeas.no Fact Sheet for Healthcare Providers: BankingDealers.co.za This test is not yet approved or cleared by the [place] FDA and has been authorized for detection and/or diagnosis of SARS-CoV-2 by FDA under an Emergency Use Authorization (EUA).  This EUA will remain in effect (meaning this test can be used) for the duration of the COVID-19 declaration under Section 564(b)(1) of the Act, 21 U.S.C. section 360bbb-3(b)(1), unless the authorization is terminated or revoked sooner. Performed at [facility],  [address], [place], [place] [address]     SIGNED:   Cordelia Poche, MD [facility] 07/11/2018, 5:47 PM

## 2018-07-11 NOTE — Discharge Instructions (Addendum)
Crystal Yates,  You were in the hospital for a COPD exacerbation. Please continue your previously prescribed azithromycin and Medrol prescribed by your pulmonologist.

## 2018-07-11 NOTE — Progress Notes (Signed)
PHARMACY - PHYSICIAN COMMUNICATION CRITICAL VALUE ALERT - BLOOD CULTURE IDENTIFICATION (BCID)  Crystal Yates is an 63 y.o. female who presented to [facility] on 07/10/2018 with a chief complaint of SOB, cough and abdominal pain  Assessment:  respirataory (include suspected source if known) BCID 1 of 4 bottles staph species- likely contaminant WBCs = 11.2, temp = 97.6  Name of physician (or Provider) Contacted: Tylene Fantasia, NP  Current antibiotics: Zmax 250 mg daily  Changes to prescribed antibiotics recommended:  No changes  Results for orders placed or performed during the hospital encounter of 07/10/18  Blood Culture ID Panel (Reflexed) (Collected: 07/10/2018  7:20 AM)  Result Value Ref Range   Enterococcus species NOT DETECTED NOT DETECTED   Listeria monocytogenes NOT DETECTED NOT DETECTED   Staphylococcus species DETECTED (A) NOT DETECTED   Staphylococcus aureus (BCID) NOT DETECTED NOT DETECTED   Methicillin resistance NOT DETECTED NOT DETECTED   Streptococcus species NOT DETECTED NOT DETECTED   Streptococcus agalactiae NOT DETECTED NOT DETECTED   Streptococcus pneumoniae NOT DETECTED NOT DETECTED   Streptococcus pyogenes NOT DETECTED NOT DETECTED   Acinetobacter baumannii NOT DETECTED NOT DETECTED   Enterobacteriaceae species NOT DETECTED NOT DETECTED   Enterobacter cloacae complex NOT DETECTED NOT DETECTED   Escherichia coli NOT DETECTED NOT DETECTED   Klebsiella oxytoca NOT DETECTED NOT DETECTED   Klebsiella pneumoniae NOT DETECTED NOT DETECTED   Proteus species NOT DETECTED NOT DETECTED   Serratia marcescens NOT DETECTED NOT DETECTED   Haemophilus influenzae NOT DETECTED NOT DETECTED   Neisseria meningitidis NOT DETECTED NOT DETECTED   Pseudomonas aeruginosa NOT DETECTED NOT DETECTED   Candida albicans NOT DETECTED NOT DETECTED   Candida glabrata NOT DETECTED NOT DETECTED   Candida krusei NOT DETECTED NOT DETECTED   Candida parapsilosis NOT DETECTED NOT  DETECTED   Candida tropicalis NOT DETECTED NOT DETECTED    Dorrene German 07/11/2018  3:53 AM

## 2018-07-11 NOTE — Progress Notes (Signed)
Inpatient Diabetes Program Recommendations  AACE/ADA: New Consensus Statement on Inpatient Glycemic Control (2015)  Target Ranges:  Prepandial:   less than 140 mg/dL      Peak postprandial:   less than 180 mg/dL (1-2 hours)      Critically ill patients:  140 - 180 mg/dL   Lab Results  Component Value Date   GLUCAP 371 (H) 07/11/2018   HGBA1C 10.5 (H) 09/16/2017    Review of Glycemic Control  Diabetes history: DM2 Outpatient Diabetes medications: Tresiba 42 units QHS, Trulicity 1.5 mg weekly on Sundays Current orders for Inpatient glycemic control: Novolog 0-9 units tidwc and hs  Blood sugars over past 24H - 77-322 mg/dL   Inpatient Diabetes Program Recommendations:     Add half home basal insulin - Lantus 21 units QHS Add Novolog 3 units tidwc for meal coverage insulin Needs updated HgbA1C to assess glycemic control PTA.  Continue to follow while inpatient.   Thank you. Lorenda Peck, RD, LDN, CDE Inpatient Diabetes Coordinator [phone]

## 2018-07-11 NOTE — Telephone Encounter (Signed)
Left voicemail to return call to schedule virtual visit

## 2018-07-13 ENCOUNTER — Other Ambulatory Visit: Payer: Self-pay

## 2018-07-13 NOTE — Consult Note (Signed)
[facility] Follow up Note:  Called Ms. Crystal Yates to explain [facility] [employer] services and to verify with patient current primary physician as listed in health record. Patient states that primary provider is Crystal Yates. Physician is not affiliated with [facility]. Thanked patient for speaking with me and verifying information. [facility] does not follow at this time.

## 2018-07-14 LAB — CULTURE, BLOOD (ROUTINE X 2): Special Requests: ADEQUATE

## 2018-07-15 LAB — CULTURE, BLOOD (ROUTINE X 2)
Culture: NO GROWTH
Special Requests: ADEQUATE

## 2018-07-19 ENCOUNTER — Other Ambulatory Visit: Payer: Self-pay

## 2018-07-19 ENCOUNTER — Emergency Department (HOSPITAL_COMMUNITY): Payer: Federal, State, Local not specified - PPO

## 2018-07-19 ENCOUNTER — Telehealth: Payer: Self-pay

## 2018-07-19 ENCOUNTER — Inpatient Hospital Stay (HOSPITAL_COMMUNITY)
Admission: EM | Admit: 2018-07-19 | Discharge: 2018-07-22 | DRG: 291 | Disposition: A | Discharge status: Home / Self Care | Payer: Federal, State, Local not specified - PPO | Attending: Family Medicine | Admitting: Family Medicine

## 2018-07-19 ENCOUNTER — Encounter (HOSPITAL_COMMUNITY): Payer: Self-pay | Admitting: Emergency Medicine

## 2018-07-19 DIAGNOSIS — J441 Chronic obstructive pulmonary disease with (acute) exacerbation: Secondary | ICD-10-CM | POA: Diagnosis present

## 2018-07-19 DIAGNOSIS — Z6841 Body Mass Index (BMI) 40.0 and over, adult: Secondary | ICD-10-CM

## 2018-07-19 DIAGNOSIS — J9602 Acute respiratory failure with hypercapnia: Secondary | ICD-10-CM

## 2018-07-19 DIAGNOSIS — Z9981 Dependence on supplemental oxygen: Secondary | ICD-10-CM

## 2018-07-19 DIAGNOSIS — Z955 Presence of coronary angioplasty implant and graft: Secondary | ICD-10-CM

## 2018-07-19 DIAGNOSIS — E785 Hyperlipidemia, unspecified: Secondary | ICD-10-CM | POA: Diagnosis present

## 2018-07-19 DIAGNOSIS — I2510 Atherosclerotic heart disease of native coronary artery without angina pectoris: Secondary | ICD-10-CM | POA: Diagnosis not present

## 2018-07-19 DIAGNOSIS — E119 Type 2 diabetes mellitus without complications: Secondary | ICD-10-CM | POA: Diagnosis not present

## 2018-07-19 DIAGNOSIS — R0602 Shortness of breath: Secondary | ICD-10-CM | POA: Diagnosis not present

## 2018-07-19 DIAGNOSIS — J449 Chronic obstructive pulmonary disease, unspecified: Secondary | ICD-10-CM | POA: Diagnosis present

## 2018-07-19 DIAGNOSIS — E872 Acidosis: Secondary | ICD-10-CM | POA: Diagnosis present

## 2018-07-19 DIAGNOSIS — Z79899 Other long term (current) drug therapy: Secondary | ICD-10-CM

## 2018-07-19 DIAGNOSIS — Z7902 Long term (current) use of antithrombotics/antiplatelets: Secondary | ICD-10-CM

## 2018-07-19 DIAGNOSIS — I5031 Acute diastolic (congestive) heart failure: Secondary | ICD-10-CM | POA: Diagnosis not present

## 2018-07-19 DIAGNOSIS — T380X5A Adverse effect of glucocorticoids and synthetic analogues, initial encounter: Secondary | ICD-10-CM | POA: Diagnosis present

## 2018-07-19 DIAGNOSIS — I252 Old myocardial infarction: Secondary | ICD-10-CM

## 2018-07-19 DIAGNOSIS — I25118 Atherosclerotic heart disease of native coronary artery with other forms of angina pectoris: Secondary | ICD-10-CM | POA: Diagnosis not present

## 2018-07-19 DIAGNOSIS — I248 Other forms of acute ischemic heart disease: Secondary | ICD-10-CM | POA: Diagnosis not present

## 2018-07-19 DIAGNOSIS — I130 Hypertensive heart and chronic kidney disease with heart failure and stage 1 through stage 4 chronic kidney disease, or unspecified chronic kidney disease: Principal | ICD-10-CM | POA: Diagnosis present

## 2018-07-19 DIAGNOSIS — Z79890 Hormone replacement therapy (postmenopausal): Secondary | ICD-10-CM

## 2018-07-19 DIAGNOSIS — G4700 Insomnia, unspecified: Secondary | ICD-10-CM | POA: Diagnosis present

## 2018-07-19 DIAGNOSIS — J9601 Acute respiratory failure with hypoxia: Secondary | ICD-10-CM | POA: Diagnosis present

## 2018-07-19 DIAGNOSIS — E669 Obesity, unspecified: Secondary | ICD-10-CM | POA: Diagnosis present

## 2018-07-19 DIAGNOSIS — E063 Autoimmune thyroiditis: Secondary | ICD-10-CM | POA: Diagnosis present

## 2018-07-19 DIAGNOSIS — Z794 Long term (current) use of insulin: Secondary | ICD-10-CM

## 2018-07-19 DIAGNOSIS — R0603 Acute respiratory distress: Secondary | ICD-10-CM | POA: Diagnosis not present

## 2018-07-19 DIAGNOSIS — I509 Heart failure, unspecified: Secondary | ICD-10-CM

## 2018-07-19 DIAGNOSIS — R0600 Dyspnea, unspecified: Secondary | ICD-10-CM | POA: Diagnosis not present

## 2018-07-19 DIAGNOSIS — F17210 Nicotine dependence, cigarettes, uncomplicated: Secondary | ICD-10-CM | POA: Diagnosis present

## 2018-07-19 DIAGNOSIS — Z9049 Acquired absence of other specified parts of digestive tract: Secondary | ICD-10-CM | POA: Diagnosis not present

## 2018-07-19 DIAGNOSIS — D72828 Other elevated white blood cell count: Secondary | ICD-10-CM | POA: Diagnosis present

## 2018-07-19 DIAGNOSIS — I5043 Acute on chronic combined systolic (congestive) and diastolic (congestive) heart failure: Secondary | ICD-10-CM | POA: Diagnosis not present

## 2018-07-19 DIAGNOSIS — E1122 Type 2 diabetes mellitus with diabetic chronic kidney disease: Secondary | ICD-10-CM | POA: Diagnosis present

## 2018-07-19 DIAGNOSIS — Z833 Family history of diabetes mellitus: Secondary | ICD-10-CM

## 2018-07-19 DIAGNOSIS — E2740 Unspecified adrenocortical insufficiency: Secondary | ICD-10-CM | POA: Diagnosis present

## 2018-07-19 DIAGNOSIS — R32 Unspecified urinary incontinence: Secondary | ICD-10-CM | POA: Diagnosis present

## 2018-07-19 DIAGNOSIS — N183 Chronic kidney disease, stage 3 (moderate): Secondary | ICD-10-CM | POA: Diagnosis present

## 2018-07-19 DIAGNOSIS — Z7952 Long term (current) use of systemic steroids: Secondary | ICD-10-CM

## 2018-07-19 DIAGNOSIS — F419 Anxiety disorder, unspecified: Secondary | ICD-10-CM | POA: Diagnosis present

## 2018-07-19 DIAGNOSIS — I110 Hypertensive heart disease with heart failure: Secondary | ICD-10-CM | POA: Diagnosis not present

## 2018-07-19 LAB — COMPREHENSIVE METABOLIC PANEL
ALT: 31 U/L (ref 0–44)
AST: 25 U/L (ref 15–41)
Albumin: 2.7 g/dL — ABNORMAL LOW (ref 3.5–5.0)
Alkaline Phosphatase: 85 U/L (ref 38–126)
Anion gap: 11 (ref 5–15)
BUN: 16 mg/dL (ref 8–23)
CO2: 28 mmol/L (ref 22–32)
Calcium: 8.9 mg/dL (ref 8.9–10.3)
Chloride: 102 mmol/L (ref 98–111)
Creatinine, Ser: 1.28 mg/dL — ABNORMAL HIGH (ref 0.44–1.00)
GFR calc Af Amer: 52 mL/min — ABNORMAL LOW (ref >60)
GFR calc non Af Amer: 44 mL/min — ABNORMAL LOW (ref >60)
Glucose, Bld: 179 mg/dL — ABNORMAL HIGH (ref 70–99)
Potassium: 4.2 mmol/L (ref 3.5–5.1)
Sodium: 141 mmol/L (ref 135–145)
Total Bilirubin: 0.4 mg/dL (ref 0.3–1.2)
Total Protein: 5.8 g/dL — ABNORMAL LOW (ref 6.5–8.1)

## 2018-07-19 LAB — BRAIN NATRIURETIC PEPTIDE: B Natriuretic Peptide: 671.6 pg/mL — ABNORMAL HIGH (ref 0.0–100.0)

## 2018-07-19 LAB — CBC WITH DIFFERENTIAL/PLATELET
Abs Immature Granulocytes: 0.07 10*3/uL (ref 0.00–0.07)
Basophils Absolute: 0 10*3/uL (ref 0.0–0.1)
Basophils Relative: 0 %
Eosinophils Absolute: 0.1 10*3/uL (ref 0.0–0.5)
Eosinophils Relative: 1 %
HCT: 36.4 % (ref 36.0–46.0)
Hemoglobin: 11.6 g/dL — ABNORMAL LOW (ref 12.0–15.0)
Immature Granulocytes: 1 %
Lymphocytes Relative: 15 %
Lymphs Abs: 1.7 10*3/uL (ref 0.7–4.0)
MCH: 30.1 pg (ref 26.0–34.0)
MCHC: 31.9 g/dL (ref 30.0–36.0)
MCV: 94.3 fL (ref 80.0–100.0)
Monocytes Absolute: 0.8 10*3/uL (ref 0.1–1.0)
Monocytes Relative: 7 %
Neutro Abs: 8.3 10*3/uL — ABNORMAL HIGH (ref 1.7–7.7)
Neutrophils Relative %: 76 %
Platelets: 228 10*3/uL (ref 150–400)
RBC: 3.86 MIL/uL — ABNORMAL LOW (ref 3.87–5.11)
RDW: 14.9 % (ref 11.5–15.5)
WBC: 11 10*3/uL — ABNORMAL HIGH (ref 4.0–10.5)
nRBC: 0 % (ref 0.0–0.2)

## 2018-07-19 LAB — TROPONIN I: Troponin I: 0.03 ng/mL (ref <0.03)

## 2018-07-19 MED ORDER — ALBUTEROL SULFATE (2.5 MG/3ML) 0.083% IN NEBU
2.5000 mg | INHALATION_SOLUTION | Freq: Four times a day (QID) | RESPIRATORY_TRACT | Status: DC | PRN
  Administered 2018-07-20: 2.5 mg via RESPIRATORY_TRACT
  Filled 2018-07-19: qty 3

## 2018-07-19 MED ORDER — ATORVASTATIN CALCIUM 10 MG PO TABS
20.0000 mg | ORAL_TABLET | Freq: Every evening | ORAL | Status: DC
  Administered 2018-07-20 – 2018-07-21 (×2): 20 mg via ORAL
  Filled 2018-07-19 (×2): qty 2

## 2018-07-19 MED ORDER — ADULT MULTIVITAMIN W/MINERALS CH
1.0000 | ORAL_TABLET | Freq: Every day | ORAL | Status: DC
  Administered 2018-07-20 – 2018-07-21 (×2): 1 via ORAL
  Filled 2018-07-19 (×2): qty 1

## 2018-07-19 MED ORDER — POTASSIUM CHLORIDE CRYS ER 20 MEQ PO TBCR
20.0000 meq | EXTENDED_RELEASE_TABLET | Freq: Every day | ORAL | Status: DC
  Administered 2018-07-20 – 2018-07-22 (×3): 20 meq via ORAL
  Filled 2018-07-19 (×3): qty 1

## 2018-07-19 MED ORDER — SODIUM CHLORIDE 0.9 % IV SOLN: 250.0000 mL | INTRAVENOUS | Status: DC | PRN

## 2018-07-19 MED ORDER — INSULIN ASPART 100 UNIT/ML ~~LOC~~ SOLN
0.0000 [IU] | Freq: Every day | SUBCUTANEOUS | Status: DC
  Administered 2018-07-20 (×2): 3 [IU] via SUBCUTANEOUS
  Administered 2018-07-21: 2 [IU] via SUBCUTANEOUS

## 2018-07-19 MED ORDER — POLYETHYLENE GLYCOL 3350 17 G PO PACK: 17.0000 g | PACK | Freq: Every day | ORAL | Status: DC | PRN

## 2018-07-19 MED ORDER — LEVOTHYROXINE SODIUM 75 MCG PO TABS
150.0000 ug | ORAL_TABLET | Freq: Every day | ORAL | Status: DC
  Administered 2018-07-21 – 2018-07-22 (×2): 150 ug via ORAL
  Filled 2018-07-19 (×2): qty 2

## 2018-07-19 MED ORDER — ACETAMINOPHEN 650 MG RE SUPP: 650.0000 mg | Freq: Four times a day (QID) | RECTAL | Status: DC | PRN

## 2018-07-19 MED ORDER — ALBUTEROL SULFATE HFA 108 (90 BASE) MCG/ACT IN AERS
4.0000 | INHALATION_SPRAY | Freq: Once | RESPIRATORY_TRACT | Status: AC
  Administered 2018-07-19: 4 via RESPIRATORY_TRACT
  Filled 2018-07-19: qty 6.7

## 2018-07-19 MED ORDER — METHYLPREDNISOLONE SODIUM SUCC 125 MG IJ SOLR
80.0000 mg | Freq: Three times a day (TID) | INTRAMUSCULAR | Status: DC
  Administered 2018-07-20 (×2): 80 mg via INTRAVENOUS
  Filled 2018-07-19 (×2): qty 2

## 2018-07-19 MED ORDER — NITROGLYCERIN 0.4 MG SL SUBL
0.4000 mg | SUBLINGUAL_TABLET | SUBLINGUAL | Status: DC | PRN
  Administered 2018-07-20 (×2): 0.4 mg via SUBLINGUAL
  Filled 2018-07-19: qty 1

## 2018-07-19 MED ORDER — FUROSEMIDE 10 MG/ML IJ SOLN
60.0000 mg | Freq: Two times a day (BID) | INTRAMUSCULAR | Status: DC
  Administered 2018-07-20 – 2018-07-22 (×5): 60 mg via INTRAVENOUS
  Filled 2018-07-19 (×5): qty 6

## 2018-07-19 MED ORDER — INSULIN ASPART 100 UNIT/ML ~~LOC~~ SOLN
0.0000 [IU] | Freq: Three times a day (TID) | SUBCUTANEOUS | Status: DC
  Administered 2018-07-20: 17:00:00 5 [IU] via SUBCUTANEOUS
  Administered 2018-07-20: 3 [IU] via SUBCUTANEOUS
  Administered 2018-07-20: 12:00:00 7 [IU] via SUBCUTANEOUS
  Administered 2018-07-21 (×4): 2 [IU] via SUBCUTANEOUS
  Administered 2018-07-22: 1 [IU] via SUBCUTANEOUS

## 2018-07-19 MED ORDER — INSULIN GLARGINE 100 UNIT/ML ~~LOC~~ SOLN
42.0000 [IU] | Freq: Every day | SUBCUTANEOUS | Status: DC
  Administered 2018-07-20 – 2018-07-21 (×3): 42 [IU] via SUBCUTANEOUS
  Filled 2018-07-19 (×4): qty 0.42

## 2018-07-19 MED ORDER — CLOPIDOGREL BISULFATE 75 MG PO TABS
75.0000 mg | ORAL_TABLET | Freq: Every day | ORAL | Status: DC
  Administered 2018-07-20 – 2018-07-21 (×2): 75 mg via ORAL
  Filled 2018-07-19 (×2): qty 1

## 2018-07-19 MED ORDER — MAGNESIUM OXIDE 400 (241.3 MG) MG PO TABS
400.0000 mg | ORAL_TABLET | Freq: Every day | ORAL | Status: DC
  Administered 2018-07-20 – 2018-07-22 (×3): 400 mg via ORAL
  Filled 2018-07-19 (×3): qty 1

## 2018-07-19 MED ORDER — HEPARIN SODIUM (PORCINE) 5000 UNIT/ML IJ SOLN: 5000.0000 [IU] | Freq: Three times a day (TID) | INTRAMUSCULAR | Status: DC

## 2018-07-19 MED ORDER — NITROGLYCERIN 0.4 MG SL SUBL: 0.4000 mg | SUBLINGUAL_TABLET | SUBLINGUAL | 3 refills | Status: AC | PRN

## 2018-07-19 MED ORDER — SODIUM CHLORIDE 0.9% FLUSH: 3.0000 mL | INTRAVENOUS | Status: DC | PRN

## 2018-07-19 MED ORDER — SODIUM CHLORIDE 0.9% FLUSH
3.0000 mL | Freq: Two times a day (BID) | INTRAVENOUS | Status: DC
  Administered 2018-07-20 – 2018-07-22 (×6): 3 mL via INTRAVENOUS

## 2018-07-19 MED ORDER — ACETAMINOPHEN 325 MG PO TABS: 650.0000 mg | ORAL_TABLET | Freq: Four times a day (QID) | ORAL | Status: DC | PRN

## 2018-07-19 MED ORDER — DULOXETINE HCL 60 MG PO CPEP
60.0000 mg | ORAL_CAPSULE | Freq: Every day | ORAL | Status: DC
  Administered 2018-07-20 – 2018-07-22 (×3): 60 mg via ORAL
  Filled 2018-07-19 (×3): qty 1

## 2018-07-19 MED ORDER — UMECLIDINIUM BROMIDE 62.5 MCG/INH IN AEPB
1.0000 | INHALATION_SPRAY | Freq: Every day | RESPIRATORY_TRACT | Status: DC
  Administered 2018-07-22: 09:00:00 1 via RESPIRATORY_TRACT
  Filled 2018-07-19: qty 7

## 2018-07-19 MED ORDER — FUROSEMIDE 10 MG/ML IJ SOLN
40.0000 mg | Freq: Once | INTRAMUSCULAR | Status: AC
  Administered 2018-07-19: 23:00:00 40 mg via INTRAVENOUS
  Filled 2018-07-19: qty 4

## 2018-07-19 MED ORDER — FUROSEMIDE 10 MG/ML IJ SOLN
40.0000 mg | Freq: Once | INTRAMUSCULAR | Status: AC
  Administered 2018-07-20: 40 mg via INTRAVENOUS
  Filled 2018-07-19: qty 4

## 2018-07-19 NOTE — Telephone Encounter (Signed)
Call the patient and make sure she has nitroglycerin handy.  Please give her an appointment to see me tomorrow by video conference at 2:45 PM.  If her chest pain worsens she should know to go to the nearest emergency room

## 2018-07-19 NOTE — ED Provider Notes (Signed)
[facility] EMERGENCY DEPARTMENT Provider Note   CSN: [id number] Arrival date & time: 07/19/18  1926    History   Chief Complaint Chief Complaint  Patient presents with  . Chest Pain    HPI Crystal Yates is a 63 y.o. female.     HPI Crystal Bloodworth Lafranceis a 63 y.o.femalewith medical history significant ofCOPD on 2 L nasal cannula oxygen, hypertension, hyperlipidemia, diabetes mellitus, hypothyroidism, depression, CHF, CAD, CKD-2, tobacco abuse, adrenal insufficiency, alcohol use  From Cath report 09/2017   Normal left main.  Proximal to mid LAD stent with diffuse 30% restenosis.  Proximal circumflex with an eccentric 50 to 65% stenosis depending upon review..  Progression at this site is noted when compared to one year ago when this region contained 30% narrowing.  FFR was not possible due to COPD and intolerance of adenosine.  Dominant right coronary with proximal to distal stenting and overlap fashion, possibly with some region of stent within stent.  Distally there is an eccentric 70 to 75% stenosis that is clearly progressed when compared to one year ago when there was less than 50% narrowing.  Mild pulmonary hypertension with mean PA pressure of 30 mmHg.  Reduced LV systolic function with regional wall motion abnormality including severe hypokinesis of the inferobasal wall.  Estimated EF 40 to 45%.  LVEDP is 20 mmHg.  Mean pulmonary capillary wedge pressure is 20 mmHg.  Successful balloon angioplasty of distal right coronary ISR reducing from 75% to 0% with TIMI grade III flow using a 3.5 mm [place] balloon x2 inflations.  Patient presents to the emergency department for several days of worsening shortness of breath having recently been discharged from the hospital several weeks ago.  She states she has had weight gain of approximately 10 pounds with bilateral lower extremity edema.  Denies any fevers at home and has occasional chest pain  that is consistent with her baseline.  Recently switched diuretics to Lasix 20 mg and she does not believe that this is working for her.  No changes in bowel or bladder habits that she is aware of.  Appetite has been normal. Past Medical History:  Diagnosis Date  . Acute on chronic systolic heart failure exacerbation(HCC) 04/08/2016  . Arthritis   . CAD in native artery    a. Prior LAD stenting based on cath. b. RCA stenting 03/2016 x2.  . Chronic combined systolic and diastolic CHF (congestive heart failure) ([place])   . CKD (chronic kidney disease), stage II   . COPD (chronic obstructive pulmonary disease) ([place])   . Diabetes mellitus without complication ([place])   . Dyspnea   . Hashimoto's thyroiditis   . Hyperlipidemia   . Hypertension   . Myocardial infarction ([place])   . On home oxygen therapy    "2L; 24/7" (10/23/2017)  . Secondary adrenal insufficiency ([place])   . Thyroid disease   . Tobacco abuse     Patient Active Problem List   Diagnosis Date Noted  . Hypoglycemia 07/10/2018  . Abdominal pain 07/10/2018  . Alcohol use 07/10/2018  . Type II diabetes mellitus with renal manifestations ([place]) 07/10/2018  . Chronic kidney disease (CKD), stage II (mild) 07/10/2018  . Chronic diastolic CHF (congestive heart failure) ([place]) 07/10/2018  . COPD exacerbation ([place]) 07/10/2018  . Suspected Covid-19 Virus Infection 07/10/2018  . Chronic respiratory failure with hypoxia and hypercapnia ([place]) 02/01/2018  . Cigarette smoker 02/01/2018  . COPD  GOLD III/ c/b hypoxemia hypercarbia  01/31/2018  . Internal  and external bleeding hemorrhoids 01/22/2018  . Sessile colonic polyp 01/22/2018  . Diverticulosis of colon without hemorrhage 01/22/2018  . Iron deficiency 01/11/2018  . Acute on chronic respiratory failure with hypoxia and hypercapnia ([place]) 01/04/2018  . Acute respiratory failure with hypoxia and hypercapnia (HCC)   . AKI (acute kidney injury) ([place])   . Respiratory crackles at left lung base    . Morbid obesity ([place]) 12/29/2017  . Acute on chronic heart failure ([place]) 12/27/2017  . Hyperglycemia   . Coronary stent restenosis due to progression of disease   . Pseudoaneurysm following procedure ([place])   . Pseudoaneurysm ([place]) 10/17/2017  . S/P angioplasty for ISR of dRCA 10/13/17 10/14/2017  . CAD (coronary artery disease) 10/13/2017  . Right upper lobe pneumonia ([place]) 05/06/2017  . Pneumonia due to respiratory syncytial virus (RSV) 04/21/2017  . Entrapment, radial nerve, right 03/31/2017  . COPD without exacerbation ([place]) 10/14/2016  . Acute on chronic combined systolic and diastolic heart failure ([place]) 10/13/2016  . Chest tightness or pressure 10/12/2016  . Dyspnea on exertion 09/29/2016  . History of coronary artery disease 09/29/2016  . OAB (overactive bladder) 09/21/2016  . Neck pain 09/08/2016  . Paresthesia of arm 09/08/2016  . Flank pain 08/31/2016  . Neuralgia of right upper extremity 04/26/2016  . Coronary artery disease 04/16/2016  . Lymphocele after surgical procedure 04/16/2016  . Ventricular tachycardia (paroxysmal) ([place]) 04/09/2016  . Acute on chronic HFrEF (heart failure with reduced ejection fraction) ([place]) 04/08/2016  . Ischemic cardiomyopathy 04/08/2016  . Type 2 diabetes mellitus without complication, with long-term current use of insulin ([place]) 04/08/2016  . Anemia, blood loss 09/16/2015  . GI bleeding 09/16/2015  . Chest pain 05/22/2015  . Hypothyroid 05/22/2015  . Status post hernia repair 01/29/2015  . Surgical wound breakdown 01/29/2015  . Hernia with strangulation 01/18/2015  . Tobacco abuse 01/18/2015  . Recurrent incisional hernia with incarceration 01/15/2015  . Sleep apnea 01/15/2015  . S/P drug eluting coronary stent placement 10/16/2014  . Adrenal insufficiency ([place]) 11/21/2013  . Anemia 11/21/2013  . Unstable angina ([place]) 11/21/2013  . Atherosclerotic heart disease of native coronary artery without angina pectoris 11/21/2013  .  Cardiomyopathy ([place]) 11/21/2013  . Congestive heart failure ([place]) 11/21/2013  . Diabetes mellitus with no complication ([place]) 86/76/7209  . Edema 11/21/2013  . Empty sella syndrome ([place]) 11/21/2013  . Hepatitis 11/21/2013  . Hyperlipemia 11/21/2013  . Hypertension 11/21/2013  . Kidney cyst, acquired 11/21/2013  . Fat necrosis 11/21/2013  . Pulmonary emphysema ([place]) 11/21/2013  . Sjogrens syndrome ([place]) 11/21/2013  . Thyroid disorder 11/21/2013  . Nicotine dependence, uncomplicated 47/11/6281  . Major depressive disorder, recurrent episode ([place]) 09/17/2013  . Displacement of lumbar intervertebral disc 06/14/2013  . Intractable migraine without aura 06/14/2013  . Mild cognitive impairment 06/14/2013  . Polyneuropathy in diabetes ([place]) 06/14/2013  . Secondary adrenal insufficiency ([place]) 04/12/2011  . DDD (degenerative disc disease), lumbosacral 01/11/2008  . Thoracic or lumbosacral neuritis or radiculitis 01/11/2008    Past Surgical History:  Procedure Laterality Date  . ABDOMINAL SURGERY    . CESAREAN SECTION    . CHOLECYSTECTOMY    . COLON RESECTION    . COLONOSCOPY WITH PROPOFOL N/A 01/16/2018   Procedure: COLONOSCOPY WITH PROPOFOL;  Surgeon: Rush Landmark Telford Nab., MD;  Location: [facility];  Service: Gastroenterology;  Laterality: N/A;  . CORONARY BALLOON ANGIOPLASTY N/A 10/13/2017   Procedure: CORONARY BALLOON ANGIOPLASTY;  Surgeon: Belva Crome, MD;  Location: [facility];  Service:  Cardiovascular;  Laterality: N/A;  . HERNIA MESH REMOVAL    . HERNIA REPAIR    . LEFT HEART CATH AND CORONARY ANGIOGRAPHY N/A 10/13/2016   Procedure: Left Heart Cath and Coronary Angiography;  Surgeon: Nelva Bush, MD;  Location: [facility];  Service: Cardiovascular;  Laterality: N/A;  . POLYPECTOMY  01/16/2018   Procedure: POLYPECTOMY;  Surgeon: Rush Landmark Telford Nab., MD;  Location: [facility];  Service: Gastroenterology;;  . RIGHT/LEFT HEART CATH AND CORONARY  ANGIOGRAPHY N/A 10/13/2017   Procedure: RIGHT/LEFT HEART CATH AND CORONARY ANGIOGRAPHY;  Surgeon: Belva Crome, MD;  Location: [facility];  Service: Cardiovascular;  Laterality: N/A;  . SHOULDER ARTHROSCOPY    . TUBAL LIGATION       OB History   No obstetric history on file.      Home Medications    Prior to Admission medications   Medication Sig Start Date End Date Taking? Authorizing Provider  acetaminophen (TYLENOL) 500 MG tablet Take 1 tablet (500 mg total) by mouth every 6 (six) hours as needed for mild pain. 07/11/18   Mariel Aloe, MD  albuterol (PROAIR HFA) 108 (90 Base) MCG/ACT inhaler Inhale 2 puffs into the lungs every 4-6 hours as needed for shortness of breath or wheezing 01/31/18   Tanda Rockers, MD  albuterol (PROVENTIL) (2.5 MG/3ML) 0.083% nebulizer solution Take 3 mLs (2.5 mg total) by nebulization every 6 (six) hours as needed for wheezing or shortness of breath. 02/28/18   Tanda Rockers, MD  atorvastatin (LIPITOR) 20 MG tablet Take 20 mg by mouth every evening. 06/18/18   [provider]  azithromycin (ZITHROMAX) 250 MG tablet Take 1 tablet (250 mg total) by mouth as directed. Patient taking differently: Take 250-500 mg by mouth See admin instructions. Take 500 mg by mouth on day 1 then take 250 mg by mouth on day 2-5 07/09/18   Tanda Rockers, MD  benzonatate (TESSALON) 100 MG capsule Take 1 capsule (100 mg total) by mouth every 8 (eight) hours. Patient not taking: Reported on 07/10/2018 06/03/18   Domenic Moras, PA-C  CALCIUM PO Take 1 tablet by mouth daily.    [provider]  Cholecalciferol (VITAMIN D3) 2000 units capsule Take 2,000 Units by mouth daily.     [provider]  clopidogrel (PLAVIX) 75 MG tablet Take 1 tablet (75 mg total) by mouth daily. Patient taking differently: Take 75 mg by mouth at bedtime.  01/20/18   Masoudi, Dorthula Rue, MD  DULoxetine (CYMBALTA) 60 MG capsule Take 60 mg by mouth daily.  12/05/16   [provider]  furosemide (LASIX) 20 MG tablet Take 60 mg by mouth daily.  06/18/18   [provider]  Galcanezumab-gnlm (EMGALITY) 120 MG/ML SOSY Inject 120 mg into the skin every 30 (thirty) days.     [provider]  hydrocortisone (ANUSOL-HC) 2.5 % rectal cream Place rectally 2 (two) times daily. Patient taking differently: Place 1 application rectally daily as needed for hemorrhoids or anal itching.  01/17/18   Masoudi, Elhamalsadat, MD  hydrocortisone (CORTEF) 5 MG tablet Take 5-15 mg by mouth See admin instructions. Take 15 mg in the morning and 5 mg in the evening    [provider]  insulin degludec (TRESIBA FLEXTOUCH) 100 UNIT/ML SOPN FlexTouch Pen Inject 42 Units into the skin at bedtime.  08/28/16   [provider]  Iron-FA-B Cmp-C-Biot-Probiotic (FUSION PLUS) CAPS Take 1 capsule by mouth at bedtime.  10/09/16   [provider]  levothyroxine (SYNTHROID, LEVOTHROID) 150 MCG tablet Take 150 mcg by mouth daily before breakfast.    [provider]  MAGNESIUM-OXIDE 400 (241.3 Mg) MG tablet Take 400 mg by mouth daily. 06/18/18   [provider]  methylPREDNISolone (MEDROL) 8 MG tablet Take 1 tablet (8 mg total) by mouth daily. 07/09/18   Tanda Rockers, MD  nitroGLYCERIN (NITROSTAT) 0.4 MG SL tablet Place 1 tablet (0.4 mg total) under the tongue every 5 (five) minutes as needed for chest pain. 07/19/18   Revankar, Reita Cliche, MD  OXYGEN Place 2 L into the nose continuous.     [provider]  polyethylene glycol (MIRALAX / GLYCOLAX) packet Take 17 g by mouth daily. Patient taking differently: Take 17 g by mouth daily as needed for mild constipation.  12/27/17   Masoudi, Elhamalsadat, MD  potassium chloride SA (K-DUR,KLOR-CON) 20 MEQ tablet Take 1 tablet (20 mEq total) by mouth daily. Take when you take Torsemide. 01/17/18   Masoudi, Elhamalsadat, MD  Suvorexant (BELSOMRA) 20 MG TABS Take 20 mg by mouth at bedtime.     [provider]  Tiotropium Bromide-Olodaterol (STIOLTO RESPIMAT) 2.5-2.5 MCG/ACT AERS Inhale 2 puffs into the lungs daily. 01/31/18   Tanda Rockers, MD  TRULICITY 1.5 PP/2.9JJ SOPN Inject 1.5 mg into the skin every Sunday.  10/10/16   [provider]    Family History Family History  Problem Relation Age of Onset  . Stroke Mother   . Diabetes Father   . Diabetes Sister   . Diabetes Sister   . Diabetes Son     Social History Social History   Tobacco Use  . Smoking status: Current Every Day Smoker    Packs/day: 0.75    Years: 44.00    Pack years: 33.00    Types: Cigarettes  . Smokeless tobacco: Never Used  Substance Use Topics  . Alcohol use: Yes    Alcohol/week: 7.0 standard drinks    Types: 7 Shots of liquor per week  . Drug use: Yes    Types: Marijuana    Comment: 10/23/2017 "had 1 ~ 1 month ago; nothing before or since"     Allergies   Hydroxychloroquine; Donepezil; Prednisone; Anticoagulant cit dext [acd formula a]; Bupropion; Metrizamide; Varenicline; and Tape   Review of Systems Review of Systems  Constitutional: Positive for activity change and fatigue. Negative for appetite change, chills and fever.  HENT: Negative for ear pain and sore throat.   Eyes: Negative for pain and visual disturbance.  Respiratory: Positive for shortness of breath. Negative for cough.   Cardiovascular: Positive for chest pain. Negative for palpitations.  Gastrointestinal: Negative for abdominal pain and vomiting.  Endocrine: Negative for polyphagia and polyuria.  Genitourinary: Negative for difficulty urinating, dysuria and hematuria.  Musculoskeletal: Negative for arthralgias and back pain.  Skin: Negative for color change and rash.  Allergic/Immunologic: Negative for immunocompromised state.  Neurological: Negative for seizures and syncope.  All other systems reviewed and are negative.    Physical Exam Updated Vital Signs BP (!) 131/91 (BP Location: Right Arm)    Pulse (!) 104   Temp 98 F (36.7 C) (Oral)   Resp (!) 21   Ht 5\' 2"  (1.575 m)   Wt 102.5 kg   SpO2 100%   BMI 41.34 kg/m   Physical Exam Vitals signs and nursing note reviewed.  Constitutional:      Appearance: She is well-developed. She is not ill-appearing or diaphoretic.  HENT:  Head: Normocephalic and atraumatic.  Eyes:     Extraocular Movements: Extraocular movements intact.     Conjunctiva/sclera: Conjunctivae normal.     Pupils: Pupils are equal, round, and reactive to light.  Neck:     Musculoskeletal: Neck supple.  Cardiovascular:     Rate and Rhythm: Regular rhythm. Tachycardia present.     Heart sounds: No murmur.  Pulmonary:     Effort: Pulmonary effort is normal. No respiratory distress.     Breath sounds: Examination of the right-middle field reveals decreased breath sounds. Examination of the left-middle field reveals decreased breath sounds. Examination of the right-lower field reveals decreased breath sounds. Examination of the left-lower field reveals decreased breath sounds. Decreased breath sounds present.  Abdominal:     Palpations: Abdomen is soft.     Tenderness: There is no abdominal tenderness.  Musculoskeletal:     Right lower leg: Edema present.     Left lower leg: Edema present.  Skin:    General: Skin is warm and dry.  Neurological:     Mental Status: She is alert.      ED Treatments / Results  Labs (all labs ordered are listed, but only abnormal results are displayed) Labs Reviewed - No data to display  EKG None  Radiology No results found.  Procedures Procedures (including critical care time)  Medications Ordered in ED Medications - No data to display   Initial Impression / Assessment and Plan / ED Course  I have reviewed the triage vital signs and the nursing notes.  Pertinent labs & imaging results that were available during my care of the patient were reviewed by me and considered in my medical decision making (see  chart for details).        63 year old woman presents to the emergency department hemodynamically stable for ongoing shortness of breath and occasional chest pain for the last several days.  Has a history of coronary artery disease with recent angioplasty 1 year ago.  Does not currently have chest pain but is feeling short of breath.  Signs and symptoms most consistent with volume overload secondary to known heart failure with reduced ejection fraction.  BNP elevated today at greater than 600 and given her obesity likely under represents the true value.  10 pounds of weight gain since she was discharged from the hospital per patient report.  Given 40 mg of IV Lasix in the setting of home dose 20 mg Lasix that does not appear to be working for her.  Creatinine slightly elevated compared to discharge but possibly related to heart failure and diuresing is indicated in this condition.  Not appear to have signs consistent with sepsis or pneumonia.  No URI symptoms.  Unlikely to be infected with COVID at this time.  Final Clinical Impressions(s) / ED Diagnoses   Final diagnoses:  Shortness of breath  Acute on chronic heart failure, unspecified heart failure type [facility])    ED Discharge Orders    None       Andee Poles, MD 07/19/18 2223    Blanchie Dessert, MD 07/20/18 2107

## 2018-07-19 NOTE — H&P (Addendum)
TRH H&P    Patient Demographics:    Crystal Yates, is a 63 y.o. female  MRN: [id number]  DOB - 25-Jan-1956  Admit Date - 07/19/2018  Referring MD/NP/PA:   Dr. Guillermina [place]  Outpatient Primary MD for the patient is Dortha Kern, [place]  Patient coming from:  home  Chief complaint-  Dyspnea, weight gain   HPI:    Crystal Yates  is a 63 y.o. female,w Copd on home o2, CHF (systolic/diastolic), CAD, Dm2, Adrenal insufficiency, Tobacco abuse, apparently presents with c/o of dyspnea for the past week , worse today, + orthopnea, and 10lbs of weight gain per pt, along with lower ext edema.  Pt notes that she has had some vague intermittent chest tightness without radiation for the past 2 weeks.  Pt denies fever, chills, cough, palp, n/v, abd pain, diarrhea, brbpr, dysuria, hematuria.  Pt notes that she was recently admitted for dyspnea and her torsemide was changed to lasix upon discharge.  She contacted cardiology today, and has an appointment for tomorrow but decided couldn't wait. Pt denies any recent travel, sick exposure,  She has been self isolating.   In ED,  T 98 P 93-104  R 21, Bp 131/91, most recent 109/59 pox 94-100% on o2 [place]  Wbc 11.0, hgb 11.6, Plt 228 Na 141, K 4.2,  Bun 16, Creatinne 1.28 (baseline 1.15) Alb 2.7 Ast 25, Alt 31, Alk phos 85, T. Bili 0.4  BNP 671.6 (prev 202.8) Trop 0.03  (prev 0.03)  CXR IMPRESSION: Negative for acute cardiopulmonary disease  Pt will be admitted for refractory dyspnea secondary to CHF (acute) diastolic     Review of systems:    In addition to the HPI above,  No Fever-chills, No Headache, No changes with Vision or hearing, No problems swallowing food or Liquids, No  Cough   No Abdominal pain, No Nausea or Vomiting, bowel movements are regular, No Blood in stool or Urine, No dysuria, No new skin rashes or bruises, No new joints pains-aches,  No new  weakness, tingling, numbness in any extremity, No recent weight gain or loss, No polyuria, polydypsia or polyphagia, No significant Mental Stressors.  All other systems reviewed and are negative.    Past History of the following :    Past Medical History:  Diagnosis Date   Acute on chronic systolic heart failure exacerbation(HCC) 04/08/2016   Arthritis    CAD in native artery    a. Prior LAD stenting based on cath. b. RCA stenting 03/2016 x2.   Chronic combined systolic and diastolic CHF (congestive heart failure) (HCC)    CKD (chronic kidney disease), stage II    COPD (chronic obstructive pulmonary disease) (HCC)    Diabetes mellitus without complication ([place])    Dyspnea    Hashimoto's thyroiditis    Hyperlipidemia    Hypertension    Myocardial infarction ([place])    On home oxygen therapy    "2L; 24/7" (10/23/2017)   Secondary adrenal insufficiency (HCC)    Thyroid disease    Tobacco abuse  Past Surgical History:  Procedure Laterality Date   ABDOMINAL SURGERY     CESAREAN SECTION     CHOLECYSTECTOMY     COLON RESECTION     COLONOSCOPY WITH PROPOFOL N/A 01/16/2018   Procedure: COLONOSCOPY WITH PROPOFOL;  Surgeon: Rush Landmark Telford Nab., MD;  Location: [place];  Service: Gastroenterology;  Laterality: N/A;   CORONARY BALLOON ANGIOPLASTY N/A 10/13/2017   Procedure: CORONARY BALLOON ANGIOPLASTY;  Surgeon: Belva Crome, MD;  Location: [facility] [place] [facility];  Service: Cardiovascular;  Laterality: N/A;   HERNIA MESH REMOVAL     HERNIA REPAIR     LEFT HEART CATH AND CORONARY ANGIOGRAPHY N/A 10/13/2016   Procedure: Left Heart Cath and Coronary Angiography;  Surgeon: Nelva Bush, MD;  Location: [place] [facility];  Service: Cardiovascular;  Laterality: N/A;   POLYPECTOMY  01/16/2018   Procedure: POLYPECTOMY;  Surgeon: Rush Landmark Telford Nab., MD;  Location: [facility];  Service: Gastroenterology;;   RIGHT/LEFT HEART CATH AND CORONARY  ANGIOGRAPHY N/A 10/13/2017   Procedure: RIGHT/LEFT HEART CATH AND CORONARY ANGIOGRAPHY;  Surgeon: Belva Crome, MD;  Location: [place] [facility];  Service: Cardiovascular;  Laterality: N/A;   SHOULDER ARTHROSCOPY     TUBAL LIGATION        Social History:      Social History   Tobacco Use   Smoking status: Current Every Day Smoker    Packs/day: 1.00    Years: 44.00    Pack years: 44.00    Types: Cigarettes   Smokeless tobacco: Never Used  Substance Use Topics   Alcohol use: Yes    Alcohol/week: 7.0 standard drinks    Types: 7 Shots of liquor per week       Family History :     Family History  Problem Relation Age of Onset   Stroke Mother    Diabetes Father    Diabetes Sister    Diabetes Sister    Diabetes Son        Home Medications:   Prior to Admission medications   Medication Sig Start Date End Date Taking? Authorizing Provider  acetaminophen (TYLENOL) 500 MG tablet Take 1 tablet (500 mg total) by mouth every 6 (six) hours as needed for mild pain. 07/11/18  Yes Mariel Aloe, MD  albuterol (PROAIR HFA) 108 (90 Base) MCG/ACT inhaler Inhale 2 puffs into the lungs every 4-6 hours as needed for shortness of breath or wheezing 01/31/18  Yes Tanda Rockers, MD  albuterol (PROVENTIL) (2.5 MG/3ML) 0.083% nebulizer solution Take 3 mLs (2.5 mg total) by nebulization every 6 (six) hours as needed for wheezing or shortness of breath. 02/28/18  Yes Tanda Rockers, MD  atorvastatin (LIPITOR) 20 MG tablet Take 20 mg by mouth every evening. 06/18/18  Yes [provider]  CALCIUM PO Take 1 tablet by mouth daily.   Yes [provider]  Cholecalciferol (VITAMIN D3) 2000 units capsule Take 2,000 Units by mouth daily.    Yes [provider]  clopidogrel (PLAVIX) 75 MG tablet Take 1 tablet (75 mg total) by mouth daily. Patient taking differently: Take 75 mg by mouth at bedtime.  01/20/18  Yes Masoudi, Elhamalsadat, MD  DULoxetine (CYMBALTA) 60 MG  capsule Take 60 mg by mouth daily.  12/05/16  Yes [provider]  furosemide (LASIX) 20 MG tablet Take 60 mg by mouth daily.  06/18/18  Yes [provider]  Galcanezumab-gnlm (EMGALITY) 120 MG/ML SOSY Inject 120 mg into the skin every 30 (thirty) days.  Yes [provider]  hydrocortisone (CORTEF) 5 MG tablet Take 5-15 mg by mouth See admin instructions. Take 15 mg in the morning and 5 mg in the evening   Yes [provider]  insulin degludec (TRESIBA FLEXTOUCH) 100 UNIT/ML SOPN FlexTouch Pen Inject 42 Units into the skin at bedtime.  08/28/16  Yes [provider]  Iron-FA-B Cmp-C-Biot-Probiotic (FUSION PLUS) CAPS Take 1 capsule by mouth at bedtime.  10/09/16  Yes [provider]  levothyroxine (SYNTHROID, LEVOTHROID) 150 MCG tablet Take 150 mcg by mouth daily before breakfast.   Yes [provider]  MAGNESIUM-OXIDE 400 (241.3 Mg) MG tablet Take 400 mg by mouth daily. 06/18/18  Yes [provider]  nitroGLYCERIN (NITROSTAT) 0.4 MG SL tablet Place 1 tablet (0.4 mg total) under the tongue every 5 (five) minutes as needed for chest pain. 07/19/18  Yes Revankar, Reita Cliche, MD  OXYGEN Place 2 L into the nose continuous.    Yes [provider]  polyethylene glycol (MIRALAX / GLYCOLAX) packet Take 17 g by mouth daily. Patient taking differently: Take 17 g by mouth daily as needed for mild constipation.  12/27/17  Yes Masoudi, Elhamalsadat, MD  potassium chloride SA (K-DUR,KLOR-CON) 20 MEQ tablet Take 1 tablet (20 mEq total) by mouth daily. Take when you take Torsemide. 01/17/18  Yes Masoudi, Elhamalsadat, MD  Suvorexant (BELSOMRA) 20 MG TABS Take 20 mg by mouth at bedtime.    Yes [provider]  Tiotropium Bromide-Olodaterol (STIOLTO RESPIMAT) 2.5-2.5 MCG/ACT AERS Inhale 2 puffs into the lungs daily. 01/31/18  Yes Tanda Rockers, MD  TRULICITY 1.5 DX/8.3JA SOPN Inject 1.5 mg into the skin every Sunday.  10/10/16  Yes [provider]  azithromycin (ZITHROMAX) 250 MG tablet Take 1 tablet (250 mg total) by mouth as directed. Patient not taking: Reported on 07/19/2018 07/09/18   Tanda Rockers, MD  benzonatate (TESSALON) 100 MG capsule Take 1 capsule (100 mg total) by mouth every 8 (eight) hours. Patient not taking: Reported on 07/10/2018 06/03/18   Domenic Moras, PA-C  hydrocortisone (ANUSOL-HC) 2.5 % rectal cream Place rectally 2 (two) times daily. Patient taking differently: Place 1 application rectally daily as needed for hemorrhoids or anal itching.  01/17/18   Masoudi, Elhamalsadat, MD  methylPREDNISolone (MEDROL) 8 MG tablet Take 1 tablet (8 mg total) by mouth daily. Patient not taking: Reported on 07/19/2018 07/09/18   Tanda Rockers, MD     Allergies:     Allergies  Allergen Reactions   Hydroxychloroquine Shortness Of Breath, Nausea Only and Other (See Comments)    Dizziness   Donepezil Other (See Comments)    Dizziness, depression, and makes the patient feel "funny"   Prednisone Other (See Comments)    Causes depression and suicidal thoughts   Anticoagulant Cit Dext [Acd Formula A] Other (See Comments)    Unknown   Bupropion Other (See Comments)    Suicidal thoughts   Metrizamide Other (See Comments)    (a non-ionic radiopaque contrast agent) "Blows the vein" and contrast gathers at the injected site's limb   Varenicline Other (See Comments)    Suicidal thoughts   Tape Rash and Other (See Comments)    Paper tape is preferred, PLEASE     Physical Exam:   Vitals  Blood pressure (!) 109/59, pulse 93, temperature 98 F (36.7 C), temperature source Oral, resp. rate (!) 21, height _0  (1.575 m), weight 102.5 kg, SpO2 97 %.  1.  General: axox3  2.  Psychiatric: euthymic  3. Neurologic: cn2-12 intact, reflexes 2+ symmetric, diffuse with no clonus, motor 5/5 in all 4 ext  4. HEENMT:  Anicteric, pupils 1.70m symmetric, direct, consensual, near intact Slight jvd  5. Respiratory  : Slight decrease in bs bilateral base, slight crackes left base, slight bilateral exp wheezing  6. Cardiovascular : rrr s1, s2, no m/g/r  7. Gastrointestinal:  Abd: obese, nt, nd, +bs  8. Skin:  Ext :  1+ bilateral lower ext edema, no rash  9.Musculoskeletal:  Good ROM    Data Review:    CBC Recent Labs  Lab 07/19/18 2058  WBC 11.0*  HGB 11.6*  HCT 36.4  PLT 228  MCV 94.3  MCH 30.1  MCHC 31.9  RDW 14.9  LYMPHSABS 1.7  MONOABS 0.8  EOSABS 0.1  BASOSABS 0.0   ------------------------------------------------------------------------------------------------------------------  Results for orders placed or performed during the hospital encounter of 07/19/18 (from the past 48 hour(s))  Brain natriuretic peptide     Status: Abnormal   Collection Time: 07/19/18  8:58 PM  Result Value Ref Range   B Natriuretic Peptide 671.6 (H) 0.0 - 100.0 pg/mL    Comment: [facility]formed at [facility] [place] [id number] Comprehensive metabolic panel     Status: Abnormal   Collection Time: 07/19/18  8:58 PM  Result Value Ref Range   Sodium 141 135 - 145 mmol/L   Potassium 4.2 3.5 - 5.1 mmol/L   Chloride 102 98 - 111 mmol/L   CO2 28 22 - 32 mmol/L   Glucose, Bld 179 (H) 70 - 99 mg/dL   BUN 16 8 - 23 mg/dL   Creatinine, Ser 1.28 (H) 0.44 - 1.00 mg/dL   Calcium 8.9 8.9 - 10.3 mg/dL   Total Protein 5.8 (L) 6.5 - 8.1 g/dL   Albumin 2.7 (L) 3.5 - 5.0 g/dL   AST 25 15 - 41 U/L   ALT 31 0 - 44 U/L   Alkaline Phosphatase 85 38 - 126 U/L   Total Bilirubin 0.4 0.3 - 1.2 mg/dL   GFR calc non Af Amer 44 (L) >60 mL/min   GFR calc Af Amer 52 (L) >60 mL/min   Anion gap 11 5 - 15    Comment: Performed at [facility], [place] [id number] CBC with Differential     Status: Abnormal   Collection Time: 07/19/18  8:58 PM  Result Value Ref Range   WBC 11.0 (H) 4.0 - 10.5 K/uL   RBC 3.86 (L) 3.87 - 5.11 MIL/uL   Hemoglobin 11.6 (L) 12.0 -  15.0 g/dL   HCT 36.4 36.0 - 46.0 %   MCV 94.3 80.0 - 100.0 fL   MCH 30.1 26.0 - 34.0 pg   MCHC 31.9 30.0 - 36.0 g/dL   RDW 14.9 11.5 - 15.5 %   Platelets 228 150 - 400 K/uL   nRBC 0.0 0.0 - 0.2 %   Neutrophils Relative % 76 %   Neutro Abs 8.3 (H) 1.7 - 7.7 K/uL   Lymphocytes Relative 15 %   Lymphs Abs 1.7 0.7 - 4.0 K/uL   Monocytes Relative 7 %   Monocytes Absolute 0.8 0.1 - 1.0 K/uL   Eosinophils Relative 1 %   Eosinophils Absolute 0.1 0.0 - 0.5 K/uL   Basophils Relative 0 %   Basophils Absolute 0.0 0.0 - 0.1 K/uL   Immature Granulocytes 1 %   Abs Immature Granulocytes 0.07 0.00 -  0.07 K/uL    Comment: Performed at [facility], [place] [address], [place], [place] [address]  Troponin I - Now Then Q3H     Status: Abnormal   Collection Time: 07/19/18  8:58 PM  Result Value Ref Range   Troponin I 0.03 (HH) <0.03 ng/mL    Comment: CRITICAL RESULT CALLED TO, READ BACK BY AND VERIFIED WITH: [facility] 07/19/18 2159 [id number] Performed at [facility], [place] [address], [place],  [address]     Chemistries  Recent Labs  Lab 07/19/18 2058  NA 141  K 4.2  CL 102  CO2 28  GLUCOSE 179*  BUN 16  CREATININE 1.28*  CALCIUM 8.9  AST 25  ALT 31  ALKPHOS 85  BILITOT 0.4   ------------------------------------------------------------------------------------------------------------------  ------------------------------------------------------------------------------------------------------------------ GFR: Estimated Creatinine Clearance: 50.5 mL/min (A) (by C-G formula based on SCr of 1.28 mg/dL (H)). Liver Function Tests: Recent Labs  Lab 07/19/18 2058  AST 25  ALT 31  ALKPHOS 85  BILITOT 0.4  PROT 5.8*  ALBUMIN 2.7*   No results for input(s): LIPASE, AMYLASE in the last 168 hours. No results for input(s): AMMONIA in the last 168 hours. Coagulation Profile: No results for input(s): INR, PROTIME in the last 168 hours. Cardiac Enzymes: Recent Labs  Lab  07/19/18 2058  TROPONINI 0.03*   BNP (last 3 results) Recent Labs    01/31/18 1230 03/15/18 1025  PROBNP 1,553* 858*   HbA1C: No results for input(s): HGBA1C in the last 72 hours. CBG: No results for input(s): GLUCAP in the last 168 hours. Lipid Profile: No results for input(s): CHOL, HDL, LDLCALC, TRIG, CHOLHDL, LDLDIRECT in the last 72 hours. Thyroid Function Tests: No results for input(s): TSH, T4TOTAL, FREET4, T3FREE, THYROIDAB in the last 72 hours. Anemia Panel: No results for input(s): VITAMINB12, FOLATE, FERRITIN, TIBC, IRON, RETICCTPCT in the last 72 hours.  --------------------------------------------------------------------------------------------------------------- Urine analysis:    Component Value Date/Time   COLORURINE YELLOW 07/10/2018 1644   APPEARANCEUR HAZY (A) 07/10/2018 1644   LABSPEC 1.015 07/10/2018 1644   PHURINE 5.0 07/10/2018 1644   GLUCOSEU NEGATIVE 07/10/2018 1644   HGBUR NEGATIVE 07/10/2018 1644   BILIRUBINUR NEGATIVE 07/10/2018 1644   KETONESUR NEGATIVE 07/10/2018 1644   PROTEINUR NEGATIVE 07/10/2018 1644   NITRITE NEGATIVE 07/10/2018 1644   LEUKOCYTESUR MODERATE (A) 07/10/2018 1644      Imaging Results:    Dg Chest Portable 1 View  Result Date: 07/19/2018 CLINICAL DATA:  63 year old female with shortness of breath EXAM: PORTABLE CHEST 1 VIEW COMPARISON:  07/10/2018 FINDINGS: Cardiomediastinal silhouette unchanged in size and contour. Calcifications of the aortic arch. No pneumothorax. No pleural effusion. No confluent airspace disease. Changes of prior PTC I. Surgical changes of the cervical region. No displaced fracture IMPRESSION: Negative for acute cardiopulmonary disease Electronically Signed   By: Corrie Mckusick D.O.   On: 07/19/2018 20:27    ekg nsr at 100, nl axis, nl int, no st-t changes c/w ischemia   Assessment & Plan:    Principal Problem:   Dyspnea Active Problems:   Adrenal insufficiency (HCC)   Coronary artery disease    Diabetes mellitus with no complication (HCC)   Acute diastolic CHF (congestive heart failure) (HCC)  Dyspnea secondary to Acute CHF (diastolic), ddx Copd exacerbation Tele Trop I q6h x3 Check cardiac echo Start Lasix 83m iv x1 in addition to 485miv x1 in ED Start Lasix 6058mv bid  CAD Cont Plavix Cont Lipitor  Copd on home o2  Solumedrol 63m iv q8h Zithromax 5083miv qday Cont Stiolto Cont Albuterol  H/o pulmonary nodule 42m10mn left lower lung on CT 07/11/2018 Needs repeat CT scan  in 6 -12 months  Dm2 fsbs ac and qhs, ISS Cont Levemir 41 units [place] qhs Hold Trulicity, uses on Sunday  Hypothyroidism, recent TSH wnl Cont Levothyroxine 150 micrograms po qday  Insomnia Cont Belsomra  Anxiety/ Arthritis Cont Cymbalta  Adrenal insufficiency HOLD Hydrocortisone 7m83m qam and 5mg 35mqpm => use solumedrol 80mg 342m8h  Tobacco use counselled on smoking cessation x 3 minutes Nicotine patch prn  ADDENDUM Hypoxic to 70's% Change to progressive Bipap per respiratory protocol Check ABG  DVT Prophylaxis-   Lovenox - SCDs   AM Labs Ordered, also please review Full Orders  Family Communication: Admission, patients condition and plan of care including tests being ordered have been discussed with the patient who indicate understanding and agree with the plan and Code Status.  Code Status:   FULL CODE  Admission status: Observation: Based on patients clinical presentation and evaluation of above clinical data, I have made determination that patient meets Observation criteria at this time.  Pt failed oral diuretics, and will need iv lasix and close monitoring due to hx of renal insufficiency, depending upon how she responds to diuresis may require inpatient admission.  Time spent in minutes : 70   Mando Blatz Jani Graveln 07/19/2018 at 11:32 PM

## 2018-07-19 NOTE — ED Notes (Signed)
ED TO INPATIENT HANDOFF REPORT  ED Nurse Name and Phone #: Caprice Kluver [phone]  S Name/Age/Gender Crystal Yates 63 y.o. female Room/Bed: 024C/024C  Code Status   Code Status: Prior  Home/SNF/Other Home Patient oriented to: self, place, time and situation Is this baseline? Yes   Triage Complete: Triage complete  Chief Complaint sob  Triage Note Pt c/o chest pain off and on for a couple of days but has since developed [facility]. SHOB occurs while sitting and increases while walking just a few feet. Pt felt like she was going to pass out earlier this afternoon. Pt denies LOC, abdominal pain,and  N/V/D.   Allergies Allergies  Allergen Reactions  . Hydroxychloroquine Shortness Of Breath, Nausea Only and Other (See Comments)    Dizziness  . Donepezil Other (See Comments)    Dizziness, depression, and makes the patient feel "funny"  . Prednisone Other (See Comments)    Causes depression and suicidal thoughts  . Anticoagulant Cit Dext [Acd Formula A] Other (See Comments)    Unknown  . Bupropion Other (See Comments)    Suicidal thoughts  . Metrizamide Other (See Comments)    (a non-ionic radiopaque contrast agent) "Blows the vein" and contrast gathers at the injected site's limb  . Varenicline Other (See Comments)    Suicidal thoughts  . Tape Rash and Other (See Comments)    Paper tape is preferred, PLEASE    Level of Care/Admitting Diagnosis ED Disposition    ED Disposition Condition [facility] Area: [place] [[id number]]  Level of Care: Telemetry Medical [104]  I expect the patient will be discharged within 24 hours: No (not a candidate for 5C-Observation unit)  Covid Evaluation: N/A  Diagnosis: Dyspnea [710626]  Admitting Physician: Jani Gravel [[id number]]  Attending Physician: Jani Gravel [[id number]]  PT Class (Do Not Modify): Observation [104]  PT Acc Code (Do Not Modify): Observation [10022]       B Medical/Surgery History Past  Medical History:  Diagnosis Date  . Acute on chronic systolic heart failure exacerbation(HCC) 04/08/2016  . Arthritis   . CAD in native artery    a. Prior LAD stenting based on cath. b. RCA stenting 03/2016 x2.  . Chronic combined systolic and diastolic CHF (congestive heart failure) ([place])   . CKD (chronic kidney disease), stage II   . COPD (chronic obstructive pulmonary disease) ([place])   . Diabetes mellitus without complication ([place])   . Dyspnea   . Hashimoto's thyroiditis   . Hyperlipidemia   . Hypertension   . Myocardial infarction ([place])   . On home oxygen therapy    "2L; 24/7" (10/23/2017)  . Secondary adrenal insufficiency ([place])   . Thyroid disease   . Tobacco abuse    Past Surgical History:  Procedure Laterality Date  . ABDOMINAL SURGERY    . CESAREAN SECTION    . CHOLECYSTECTOMY    . COLON RESECTION    . COLONOSCOPY WITH PROPOFOL N/A 01/16/2018   Procedure: COLONOSCOPY WITH PROPOFOL;  Surgeon: Rush Landmark Telford Nab., MD;  Location: [place];  Service: Gastroenterology;  Laterality: N/A;  . CORONARY BALLOON ANGIOPLASTY N/A 10/13/2017   Procedure: CORONARY BALLOON ANGIOPLASTY;  Surgeon: Belva Crome, MD;  Location: [place] [facility];  Service: Cardiovascular;  Laterality: N/A;  . HERNIA MESH REMOVAL    . HERNIA REPAIR    . LEFT HEART CATH AND CORONARY ANGIOGRAPHY N/A 10/13/2016   Procedure: Left Heart Cath and Coronary Angiography;  Surgeon: Nelva Bush,  MD;  Location: [place] [facility];  Service: Cardiovascular;  Laterality: N/A;  . POLYPECTOMY  01/16/2018   Procedure: POLYPECTOMY;  Surgeon: Rush Landmark Telford Nab., MD;  Location: [place];  Service: Gastroenterology;;  . RIGHT/LEFT HEART CATH AND CORONARY ANGIOGRAPHY N/A 10/13/2017   Procedure: RIGHT/LEFT HEART CATH AND CORONARY ANGIOGRAPHY;  Surgeon: Belva Crome, MD;  Location: [place] [facility];  Service: Cardiovascular;  Laterality: N/A;  . SHOULDER ARTHROSCOPY    . TUBAL LIGATION       A IV  Location/Drains/Wounds Patient Lines/Drains/Airways Status   Active Line/Drains/Airways    Name:   Placement date:   Placement time:   Site:   Days:   Wound / Incision (Open or Dehisced) 12/24/17 Other (Comment) Buttocks Right Stage II   12/24/17    1651    Buttocks   207   Wound / Incision (Open or Dehisced) 12/24/17 Other (Comment) Abdomen Anterior post surgical bowel resection    12/24/17    1656    Abdomen   207          Intake/Output Last 24 hours No intake or output data in the 24 hours ending 07/19/18 2256  Labs/Imaging Results for orders placed or performed during the hospital encounter of 07/19/18 (from the past 48 hour(s))  Brain natriuretic peptide     Status: Abnormal   Collection Time: 07/19/18  8:58 PM  Result Value Ref Range   B Natriuretic Peptide 671.6 (H) 0.0 - 100.0 pg/mL    Comment: Performed at [facility], [address], [place], [place] [address]  Comprehensive metabolic panel     Status: Abnormal   Collection Time: 07/19/18  8:58 PM  Result Value Ref Range   Sodium 141 135 - 145 mmol/L   Potassium 4.2 3.5 - 5.1 mmol/L   Chloride 102 98 - 111 mmol/L   CO2 28 22 - 32 mmol/L   Glucose, Bld 179 (H) 70 - 99 mg/dL   BUN 16 8 - 23 mg/dL   Creatinine, Ser 1.28 (H) 0.44 - 1.00 mg/dL   Calcium 8.9 8.9 - 10.3 mg/dL   Total Protein 5.8 (L) 6.5 - 8.1 g/dL   Albumin 2.7 (L) 3.5 - 5.0 g/dL   AST 25 15 - 41 U/L   ALT 31 0 - 44 U/L   Alkaline Phosphatase 85 38 - 126 U/L   Total Bilirubin 0.4 0.3 - 1.2 mg/dL   GFR calc non Af Amer 44 (L) >60 mL/min   GFR calc Af Amer 52 (L) >60 mL/min   Anion gap 11 5 - 15    Comment: Performed at [facility] [address], [place], [place] [address]  CBC with Differential     Status: Abnormal   Collection Time: 07/19/18  8:58 PM  Result Value Ref Range   WBC 11.0 (H) 4.0 - 10.5 K/uL   RBC 3.86 (L) 3.87 - 5.11 MIL/uL   Hemoglobin 11.6 (L) 12.0 - 15.0 g/dL   HCT 36.4 36.0 - 46.0 %   MCV 94.3 80.0 - 100.0 fL    MCH 30.1 26.0 - 34.0 pg   MCHC 31.9 30.0 - 36.0 g/dL   RDW 14.9 11.5 - 15.5 %   Platelets 228 150 - 400 K/uL   nRBC 0.0 0.0 - 0.2 %   Neutrophils Relative % 76 %   Neutro Abs 8.3 (H) 1.7 - 7.7 K/uL   Lymphocytes Relative 15 %   Lymphs Abs 1.7 0.7 -  4.0 K/uL   Monocytes Relative 7 %   Monocytes Absolute 0.8 0.1 - 1.0 K/uL   Eosinophils Relative 1 %   Eosinophils Absolute 0.1 0.0 - 0.5 K/uL   Basophils Relative 0 %   Basophils Absolute 0.0 0.0 - 0.1 K/uL   Immature Granulocytes 1 %   Abs Immature Granulocytes 0.07 0.00 - 0.07 K/uL    Comment: Performed at [facility], [place] [address] [place], [place] [address]  Troponin I - Now Then Q3H     Status: Abnormal   Collection Time: 07/19/18  8:58 PM  Result Value Ref Range   Troponin I 0.03 (HH) <0.03 ng/mL    Comment: CRITICAL RESULT CALLED TO, READ BACK BY AND VERIFIED WITH: Javari Bufkin [facility] 07/19/18 2159 WAYK Performed at [facility], [place] [address] [place], [place] [address]    Dg Chest Portable 1 View  Result Date: 07/19/2018 CLINICAL DATA:  63 year old female with shortness of breath EXAM: PORTABLE CHEST 1 VIEW COMPARISON:  07/10/2018 FINDINGS: Cardiomediastinal silhouette unchanged in size and contour. Calcifications of the aortic arch. No pneumothorax. No pleural effusion. No confluent airspace disease. Changes of prior PTC I. Surgical changes of the cervical region. No displaced fracture IMPRESSION: Negative for acute cardiopulmonary disease Electronically Signed   By: Corrie Mckusick D.O.   On: 07/19/2018 20:27    Pending Labs Unresulted Labs (From admission, onward)    Start     Ordered   07/19/18 2005  Troponin I - Now Then Q3H  Now then every 3 hours,   R     07/19/18 2005          Vitals/Pain Today's Vitals   07/19/18 1941 07/19/18 1942 07/19/18 1945 07/19/18 2000  BP:  (!) 131/91  131/90  Pulse:  (!) 104  (!) 104  Resp:  (!) 21  19  Temp:  98 F (36.7 C)    TempSrc:  Oral    SpO2: 100% 100%  100%   Weight:   102.5 kg   Height:   _0  (1.575 m)   PainSc:   7      Isolation Precautions No active isolations  Medications Medications  furosemide (LASIX) injection 40 mg (has no administration in time range)  albuterol (VENTOLIN HFA) 108 (90 Base) MCG/ACT inhaler 4 puff (4 puffs Inhalation Given 07/19/18 2019)    Mobility walks High fall risk   Focused Assessments Cardiac Assessment Handoff:  Cardiac Rhythm: Normal sinus rhythm Lab Results  Component Value Date   TROPONINI 0.03 (HH) 07/19/2018   Lab Results  Component Value Date   DDIMER 0.35 09/17/2017   Does the Patient currently have chest pain? No     R Recommendations: See Admitting Provider Note  Report given to:   Additional Notes:

## 2018-07-19 NOTE — ED Notes (Signed)
Report given to receiving nurse

## 2018-07-19 NOTE — ED Triage Notes (Signed)
Pt c/o chest pain off and on for a couple of days but has since developed [facility]. SHOB occurs while sitting and increases while walking just a few feet. Pt felt like she was going to pass out earlier this afternoon. Pt denies LOC, abdominal pain,and  N/V/D.

## 2018-07-19 NOTE — Telephone Encounter (Signed)
Patient still having SOB but aware of s/s to go to emergency room. Patient added to Dr. Docia Furl schedule for virtual visit tomorrow at 1545 and refill for nitroglycerin sent.

## 2018-07-19 NOTE — ED Notes (Signed)
Pt is up for ambulation inside the room. Pt reports dizziness and shortness of breath after 2 minutes of walking. Pt assisted back to bed. Respiration rate is 22 at time.

## 2018-07-19 NOTE — Telephone Encounter (Signed)
Patient reporting recurrent swelling to lower extremities and difficulty walking more than a few steps without shortness of breath. Uses 2 L O2 continuously, current O2 sat 94%. Weight gain of 6 pounds over last 4-5 days, weight 226#, 122/79, HR 87-111. Recently hospitalization due to COPD exacerbation, discharged several days ago. She denies cough, arrhythmias or her typical chest pain. She is experiencing tightness below her breasts bilaterally, describes like a squeezing, worse with bending over, leaning or stretching back. She has had 2 upper abdominal surgeries with mesh placement and thinks the tighteness is from that. It has occurred off and on for 1 month with a max intensity of 7-8/10. She has previously taken nitroglycerin for this and it didn't help much .It feels different from her usual chest pain which is in the mid chest.  Currently taking lasix 60 mg daily, plavix and a statin. Taking meds as ordered. She has run out of nitroglycerin, advised to get this refilled soon.   pls advise

## 2018-07-20 ENCOUNTER — Telehealth: Payer: Self-pay | Admitting: *Deleted

## 2018-07-20 ENCOUNTER — Inpatient Hospital Stay (HOSPITAL_COMMUNITY): Payer: Federal, State, Local not specified - PPO

## 2018-07-20 ENCOUNTER — Observation Stay (HOSPITAL_COMMUNITY): Payer: Federal, State, Local not specified - PPO

## 2018-07-20 ENCOUNTER — Encounter (HOSPITAL_COMMUNITY): Payer: Self-pay | Admitting: General Practice

## 2018-07-20 ENCOUNTER — Other Ambulatory Visit: Payer: Self-pay

## 2018-07-20 ENCOUNTER — Telehealth: Payer: Federal, State, Local not specified - PPO | Admitting: Cardiology

## 2018-07-20 DIAGNOSIS — I252 Old myocardial infarction: Secondary | ICD-10-CM | POA: Diagnosis not present

## 2018-07-20 DIAGNOSIS — Z9981 Dependence on supplemental oxygen: Secondary | ICD-10-CM | POA: Diagnosis not present

## 2018-07-20 DIAGNOSIS — J9602 Acute respiratory failure with hypercapnia: Secondary | ICD-10-CM | POA: Diagnosis not present

## 2018-07-20 DIAGNOSIS — Z7902 Long term (current) use of antithrombotics/antiplatelets: Secondary | ICD-10-CM | POA: Diagnosis not present

## 2018-07-20 DIAGNOSIS — Z6841 Body Mass Index (BMI) 40.0 and over, adult: Secondary | ICD-10-CM | POA: Diagnosis not present

## 2018-07-20 DIAGNOSIS — Z955 Presence of coronary angioplasty implant and graft: Secondary | ICD-10-CM | POA: Diagnosis not present

## 2018-07-20 DIAGNOSIS — I248 Other forms of acute ischemic heart disease: Secondary | ICD-10-CM | POA: Diagnosis present

## 2018-07-20 DIAGNOSIS — J9601 Acute respiratory failure with hypoxia: Secondary | ICD-10-CM | POA: Diagnosis not present

## 2018-07-20 DIAGNOSIS — Z9049 Acquired absence of other specified parts of digestive tract: Secondary | ICD-10-CM | POA: Diagnosis not present

## 2018-07-20 DIAGNOSIS — I5043 Acute on chronic combined systolic (congestive) and diastolic (congestive) heart failure: Secondary | ICD-10-CM | POA: Diagnosis not present

## 2018-07-20 DIAGNOSIS — I5031 Acute diastolic (congestive) heart failure: Secondary | ICD-10-CM | POA: Diagnosis not present

## 2018-07-20 DIAGNOSIS — R0600 Dyspnea, unspecified: Secondary | ICD-10-CM

## 2018-07-20 DIAGNOSIS — Z79899 Other long term (current) drug therapy: Secondary | ICD-10-CM | POA: Diagnosis not present

## 2018-07-20 DIAGNOSIS — R0603 Acute respiratory distress: Secondary | ICD-10-CM | POA: Diagnosis not present

## 2018-07-20 DIAGNOSIS — I130 Hypertensive heart and chronic kidney disease with heart failure and stage 1 through stage 4 chronic kidney disease, or unspecified chronic kidney disease: Secondary | ICD-10-CM | POA: Diagnosis not present

## 2018-07-20 DIAGNOSIS — Z794 Long term (current) use of insulin: Secondary | ICD-10-CM | POA: Diagnosis not present

## 2018-07-20 DIAGNOSIS — Z7952 Long term (current) use of systemic steroids: Secondary | ICD-10-CM | POA: Diagnosis not present

## 2018-07-20 DIAGNOSIS — I2510 Atherosclerotic heart disease of native coronary artery without angina pectoris: Secondary | ICD-10-CM | POA: Diagnosis not present

## 2018-07-20 DIAGNOSIS — R0602 Shortness of breath: Secondary | ICD-10-CM | POA: Diagnosis present

## 2018-07-20 DIAGNOSIS — F17210 Nicotine dependence, cigarettes, uncomplicated: Secondary | ICD-10-CM | POA: Diagnosis present

## 2018-07-20 DIAGNOSIS — R609 Edema, unspecified: Secondary | ICD-10-CM

## 2018-07-20 DIAGNOSIS — Z79890 Hormone replacement therapy (postmenopausal): Secondary | ICD-10-CM | POA: Diagnosis not present

## 2018-07-20 DIAGNOSIS — J449 Chronic obstructive pulmonary disease, unspecified: Secondary | ICD-10-CM | POA: Diagnosis present

## 2018-07-20 DIAGNOSIS — J441 Chronic obstructive pulmonary disease with (acute) exacerbation: Secondary | ICD-10-CM | POA: Diagnosis present

## 2018-07-20 DIAGNOSIS — N183 Chronic kidney disease, stage 3 (moderate): Secondary | ICD-10-CM | POA: Diagnosis present

## 2018-07-20 DIAGNOSIS — E872 Acidosis: Secondary | ICD-10-CM | POA: Diagnosis present

## 2018-07-20 DIAGNOSIS — E2740 Unspecified adrenocortical insufficiency: Secondary | ICD-10-CM | POA: Diagnosis present

## 2018-07-20 DIAGNOSIS — E785 Hyperlipidemia, unspecified: Secondary | ICD-10-CM | POA: Diagnosis present

## 2018-07-20 DIAGNOSIS — Z833 Family history of diabetes mellitus: Secondary | ICD-10-CM | POA: Diagnosis not present

## 2018-07-20 LAB — GLUCOSE, CAPILLARY
Glucose-Capillary: 231 mg/dL — ABNORMAL HIGH (ref 70–99)
Glucose-Capillary: 249 mg/dL — ABNORMAL HIGH (ref 70–99)
Glucose-Capillary: 268 mg/dL — ABNORMAL HIGH (ref 70–99)
Glucose-Capillary: 284 mg/dL — ABNORMAL HIGH (ref 70–99)
Glucose-Capillary: 313 mg/dL — ABNORMAL HIGH (ref 70–99)
Glucose-Capillary: 336 mg/dL — ABNORMAL HIGH (ref 70–99)

## 2018-07-20 LAB — BLOOD GAS, ARTERIAL
Acid-Base Excess: 1.7 mmol/L (ref 0.0–2.0)
Acid-base deficit: 2.7 mmol/L — ABNORMAL HIGH (ref 0.0–2.0)
Bicarbonate: 27.5 mmol/L (ref 20.0–28.0)
Bicarbonate: 27.6 mmol/L (ref 20.0–28.0)
Delivery systems: POSITIVE
Delivery systems: POSITIVE
Drawn by: 51133
Drawn by: 51133
Expiratory PAP: 10
Expiratory PAP: 8
FIO2: 1
FIO2: 60
Inspiratory PAP: 16
Inspiratory PAP: 18
O2 Saturation: 95.9 %
O2 Saturation: 97.8 %
Patient temperature: 98.6
Patient temperature: 98.8
RATE: 10 resp/min
pCO2 arterial: 113 mmHg (ref 32.0–48.0)
pCO2 arterial: 58.2 mmHg — ABNORMAL HIGH (ref 32.0–48.0)
pH, Arterial: 7.019 — CL (ref 7.350–7.450)
pH, Arterial: 7.297 — ABNORMAL LOW (ref 7.350–7.450)
pO2, Arterial: 208 mmHg — ABNORMAL HIGH (ref 83.0–108.0)
pO2, Arterial: 93.6 mmHg (ref 83.0–108.0)

## 2018-07-20 LAB — COMPREHENSIVE METABOLIC PANEL
ALT: 86 U/L — ABNORMAL HIGH (ref 0–44)
AST: 125 U/L — ABNORMAL HIGH (ref 15–41)
Albumin: 2.8 g/dL — ABNORMAL LOW (ref 3.5–5.0)
Alkaline Phosphatase: 173 U/L — ABNORMAL HIGH (ref 38–126)
Anion gap: 11 (ref 5–15)
BUN: 20 mg/dL (ref 8–23)
CO2: 27 mmol/L (ref 22–32)
Calcium: 8.9 mg/dL (ref 8.9–10.3)
Chloride: 102 mmol/L (ref 98–111)
Creatinine, Ser: 1.37 mg/dL — ABNORMAL HIGH (ref 0.44–1.00)
GFR calc Af Amer: 47 mL/min — ABNORMAL LOW (ref >60)
GFR calc non Af Amer: 41 mL/min — ABNORMAL LOW (ref >60)
Glucose, Bld: 303 mg/dL — ABNORMAL HIGH (ref 70–99)
Potassium: 4.7 mmol/L (ref 3.5–5.1)
Sodium: 140 mmol/L (ref 135–145)
Total Bilirubin: 0.4 mg/dL (ref 0.3–1.2)
Total Protein: 6.4 g/dL — ABNORMAL LOW (ref 6.5–8.1)

## 2018-07-20 LAB — TROPONIN I
Troponin I: 0.04 ng/mL (ref <0.03)
Troponin I: 0.05 ng/mL (ref <0.03)
Troponin I: 0.04 ng/mL (ref <0.03)

## 2018-07-20 LAB — CBC
HCT: 39.3 % (ref 36.0–46.0)
Hemoglobin: 12.5 g/dL (ref 12.0–15.0)
MCH: 29.6 pg (ref 26.0–34.0)
MCHC: 31.8 g/dL (ref 30.0–36.0)
MCV: 93.1 fL (ref 80.0–100.0)
Platelets: 264 10*3/uL (ref 150–400)
RBC: 4.22 MIL/uL (ref 3.87–5.11)
RDW: 14.8 % (ref 11.5–15.5)
WBC: 27.2 10*3/uL — ABNORMAL HIGH (ref 4.0–10.5)
nRBC: 0 % (ref 0.0–0.2)

## 2018-07-20 LAB — ECHOCARDIOGRAM LIMITED
Height: 62 in
Weight: 3555.58 oz

## 2018-07-20 LAB — BLOOD GAS, VENOUS
Acid-Base Excess: 5.4 mmol/L — ABNORMAL HIGH (ref 0.0–2.0)
Bicarbonate: 29.9 mmol/L — ABNORMAL HIGH (ref 20.0–28.0)
O2 Content: 2 L/min
O2 Saturation: 69 %
Patient temperature: 98.6
pCO2, Ven: 48.4 mmHg (ref 44.0–60.0)
pH, Ven: 7.408 (ref 7.250–7.430)
pO2, Ven: 36.2 mmHg (ref 32.0–45.0)

## 2018-07-20 MED ORDER — METHYLPREDNISOLONE SODIUM SUCC 40 MG IJ SOLR
40.0000 mg | Freq: Two times a day (BID) | INTRAMUSCULAR | Status: AC
  Administered 2018-07-20 – 2018-07-21 (×3): 40 mg via INTRAVENOUS
  Filled 2018-07-20 (×3): qty 1

## 2018-07-20 MED ORDER — ENOXAPARIN SODIUM 120 MG/0.8ML ~~LOC~~ SOLN
1.0000 mg/kg | Freq: Two times a day (BID) | SUBCUTANEOUS | Status: DC
  Administered 2018-07-20: 100 mg via SUBCUTANEOUS
  Filled 2018-07-20: qty 0.68

## 2018-07-20 MED ORDER — SODIUM CHLORIDE 0.9 % IV SOLN
500.0000 mg | Freq: Every day | INTRAVENOUS | Status: DC
  Administered 2018-07-20: 500 mg via INTRAVENOUS
  Filled 2018-07-20 (×2): qty 500

## 2018-07-20 MED ORDER — OXYCODONE HCL 5 MG PO TABS
5.0000 mg | ORAL_TABLET | ORAL | Status: DC | PRN
  Administered 2018-07-20 – 2018-07-21 (×3): 5 mg via ORAL
  Filled 2018-07-20 (×3): qty 1

## 2018-07-20 MED ORDER — ENOXAPARIN SODIUM 60 MG/0.6ML ~~LOC~~ SOLN: 50.0000 mg | Freq: Every day | SUBCUTANEOUS | Status: DC

## 2018-07-20 MED ORDER — CHLORHEXIDINE GLUCONATE 0.12 % MT SOLN
15.0000 mL | Freq: Two times a day (BID) | OROMUCOSAL | Status: DC
  Administered 2018-07-20 – 2018-07-22 (×5): 15 mL via OROMUCOSAL
  Filled 2018-07-20 (×5): qty 15

## 2018-07-20 MED ORDER — ORAL CARE MOUTH RINSE
15.0000 mL | Freq: Two times a day (BID) | OROMUCOSAL | Status: DC
  Administered 2018-07-20 (×2): 15 mL via OROMUCOSAL

## 2018-07-20 MED ORDER — MORPHINE SULFATE (PF) 2 MG/ML IV SOLN
2.0000 mg | INTRAVENOUS | Status: DC | PRN
  Administered 2018-07-20: 2 mg via INTRAVENOUS
  Filled 2018-07-20: qty 1

## 2018-07-20 MED ORDER — PERFLUTREN LIPID MICROSPHERE
1.0000 mL | INTRAVENOUS | Status: AC | PRN
  Administered 2018-07-20: 4 mL via INTRAVENOUS
  Filled 2018-07-20: qty 10

## 2018-07-20 NOTE — Progress Notes (Signed)
Patient ID: Crystal Yates, female   DOB: [birth year], 63 y.o.   MRN: [id number]   Xcover ABG improved, will try to keep o2 sat around 90% d/w respiratory therapy. Pt opens eyes and follows commands, appreciate critical care input,  Transfer to Stepdown.

## 2018-07-20 NOTE — Progress Notes (Signed)
Inpatient Diabetes Program Recommendations  AACE/ADA: New Consensus Statement on Inpatient Glycemic Control (2015)  Target Ranges:  Prepandial:   less than 140 mg/dL      Peak postprandial:   less than 180 mg/dL (1-2 hours)      Critically ill patients:  140 - 180 mg/dL   Lab Results  Component Value Date   GLUCAP 313 (H) 07/20/2018   HGBA1C 10.5 (H) 09/16/2017    Review of Glycemic Control  Diabetes history: type 2 Outpatient Diabetes medications: Tresiba 42 units every HS, Trulicity 1.5 mg weekly Current orders for Inpatient glycemic control: Lantus 42 units every HS, Novolog SENSITIVE TID & HS  Inpatient Diabetes Program Recommendations:   Noted that blood sugars continue to be elevated due to steroids. Recommend increasing Novolog correction scale to RESISTANT (0-20 units) TID & HS scale. If postprandial blood sugars continue to be elevated, consider adding Novolog 4 units TID as meal coverage if eating at least 50% of meal.   Harvel Ricks RN BSN CDE Diabetes Coordinator Pager: [phone]  8am-5pm

## 2018-07-20 NOTE — Telephone Encounter (Signed)
Pt is at [facility] right now with Shortness of Breath and chest pressure. Wanted Dr. Geraldo Pitter to know why missed visit today.

## 2018-07-20 NOTE — Progress Notes (Signed)
Complained of chest tightness scale 7/10 bp-124/62 nitro x3 given without relief. Stat ekg done result seen by md. Stat troponin  done pending result.  Still with chest pain scale -6 Md aware with order. Morphine 2mg  iv given. Continue to monitor.

## 2018-07-20 NOTE — Progress Notes (Signed)
Pt admitted to rm 3E20 from ED, as soon as pt was transferred to bed from stretcher, pt's  very short of breath and tachypneic, pt's oxygen sat was 79% on 3l [place]. Pt placed on non rebreather and oxygen sat went up to 88%. Rapid Response Nurse dave informed and came to assessed the patient, Dr. Maudie Mercury informed and order to put pt on Bipap and transfer to progressive care. Pt will be transfer to 2C11, report given to [facility].

## 2018-07-20 NOTE — Consult Note (Signed)
Cardiology Consultation:   Patient ID: Crystal Yates MRN: [id number]; DOB: Oct 18, 1955  Admit date: 07/19/2018 Date of Consult: 07/20/2018  Primary Care Provider: Dortha Kern, PA Primary Cardiologist: Jenne Campus, MD    Patient Profile:   Crystal Yates is a 63 y.o. female with a hx of diabetes mellitus, hypertension, hyperlipidemia, COPD (home o2 dependent), chronic stage II kidney disease who is being seen today for the evaluation of acute on chronic diastolic congestive heart failure and chest pain at the request of A Fayrene Helper, MD.  History of Present Illness:   Patient has had prior PCI. Ejection fraction moderate to severely reduced.  Cardiac catheterization July 2019 showed 50 to 65% proximal circumflex, distal 70 to 75% right coronary artery, ejection fraction 40 to 45%.  Patient had PCI of the right coronary artery.  Patient recently discharged following admission for COPD flare.  Patient states that for the past 1 month she has had worsening dyspnea on exertion.  In the past 2 weeks she has noticed increased pedal edema left greater than right which is her normal.  She has gained 6 pounds over the past 2 weeks.  She denies orthopnea.  She also has chest tightness that can last all day.  It increases with inspiration, lying flat and also sitting up.  She states it is unlike her previous angina.  She feels some of this tightness when she is volume overloaded.  She was admitted and diuresed.  Cardiology asked to evaluate.  Past Medical History:  Diagnosis Date   Acute on chronic systolic heart failure exacerbation(HCC) 04/08/2016   Arthritis    CAD in native artery    a. Prior LAD stenting based on cath. b. RCA stenting 03/2016 x2.   Chronic combined systolic and diastolic CHF (congestive heart failure) (HCC)    CKD (chronic kidney disease), stage II    COPD (chronic obstructive pulmonary disease) (HCC)    Diabetes mellitus without complication  ([place])    Hashimoto's thyroiditis    Hyperlipidemia    Hypertension    Myocardial infarction ([place])    On home oxygen therapy    "2L; 24/7" (10/23/2017)   Secondary adrenal insufficiency (HCC)    Thyroid disease    Tobacco abuse     Past Surgical History:  Procedure Laterality Date   ABDOMINAL SURGERY     CESAREAN SECTION     CHOLECYSTECTOMY     COLON RESECTION     COLONOSCOPY WITH PROPOFOL N/A 01/16/2018   Procedure: COLONOSCOPY WITH PROPOFOL;  Surgeon: Irving Copas., MD;  Location: [facility] ENDOSCOPY;  Service: Gastroenterology;  Laterality: N/A;   CORONARY BALLOON ANGIOPLASTY N/A 10/13/2017   Procedure: CORONARY BALLOON ANGIOPLASTY;  Surgeon: Belva Crome, MD;  Location: [facility];  Service: Cardiovascular;  Laterality: N/A;   HERNIA MESH REMOVAL     HERNIA REPAIR     LEFT HEART CATH AND CORONARY ANGIOGRAPHY N/A 10/13/2016   Procedure: Left Heart Cath and Coronary Angiography;  Surgeon: Nelva Bush, MD;  Location: [facility];  Service: Cardiovascular;  Laterality: N/A;   POLYPECTOMY  01/16/2018   Procedure: POLYPECTOMY;  Surgeon: Rush Landmark Telford Nab., MD;  Location: [facility] ENDOSCOPY;  Service: Gastroenterology;;   RIGHT/LEFT HEART CATH AND CORONARY ANGIOGRAPHY N/A 10/13/2017   Procedure: RIGHT/LEFT HEART CATH AND CORONARY ANGIOGRAPHY;  Surgeon: Belva Crome, MD;  Location: [facility];  Service: Cardiovascular;  Laterality: N/A;   SHOULDER ARTHROSCOPY     TUBAL LIGATION  Inpatient Medications: Scheduled Meds:  atorvastatin  20 mg Oral QPM   chlorhexidine  15 mL Mouth Rinse BID   clopidogrel  75 mg Oral QHS   DULoxetine  60 mg Oral Daily   [START ON 07/21/2018] enoxaparin (LOVENOX) injection  50 mg Subcutaneous QHS   furosemide  60 mg Intravenous BID   insulin aspart  0-5 Units Subcutaneous QHS   insulin aspart  0-9 Units Subcutaneous TID WC   insulin glargine  42 Units Subcutaneous QHS   levothyroxine   150 mcg Oral QAC breakfast   magnesium oxide  400 mg Oral Daily   mouth rinse  15 mL Mouth Rinse q12n4p   methylPREDNISolone (SOLU-MEDROL) injection  40 mg Intravenous Q12H   multivitamin with minerals  1 tablet Oral QHS   potassium chloride SA  20 mEq Oral Daily   sodium chloride flush  3 mL Intravenous Q12H   umeclidinium bromide  1 puff Inhalation Daily   Continuous Infusions:  sodium chloride     PRN Meds: sodium chloride, acetaminophen **OR** acetaminophen, albuterol, morphine injection, nitroGLYCERIN, oxyCODONE, polyethylene glycol, sodium chloride flush  Allergies:    Allergies  Allergen Reactions   Hydroxychloroquine Shortness Of Breath, Nausea Only and Other (See Comments)    Dizziness   Donepezil Other (See Comments)    Dizziness, depression, and makes the patient feel "funny"   Prednisone Other (See Comments)    Causes depression and suicidal thoughts   Anticoagulant Cit Dext [Acd Formula A] Other (See Comments)    Unknown   Bupropion Other (See Comments)    Suicidal thoughts   Metrizamide Other (See Comments)    (a non-ionic radiopaque contrast agent) "Blows the vein" and contrast gathers at the injected site's limb   Varenicline Other (See Comments)    Suicidal thoughts   Tape Rash and Other (See Comments)    Paper tape is preferred, PLEASE    Social History:   Social History   Socioeconomic History   Marital status: Married    Spouse name: Not on file   Number of children: 4   Years of education: Not on file   Highest education level: Not on file  Occupational History   Not on file  Social Needs   Financial resource strain: Not on file   Food insecurity:    Worry: Not on file    Inability: Not on file   Transportation needs:    Medical: Not on file    Non-medical: Not on file  Tobacco Use   Smoking status: Current Every Day Smoker    Packs/day: 1.00    Years: 44.00    Pack years: 44.00    Types: Cigarettes    Smokeless tobacco: Never Used  Substance and Sexual Activity   Alcohol use: Yes    Alcohol/week: 7.0 standard drinks    Types: 7 Shots of liquor per week   Drug use: Not Currently    Types: Marijuana    Comment: 10/23/2017 "had 1 ~ 1 month ago; nothing before or since"   Sexual activity: Yes  Lifestyle   Physical activity:    Days per week: Not on file    Minutes per session: Not on file   Stress: Not on file  Relationships   Social connections:    Talks on phone: Patient refused    Gets together: Patient refused    Attends religious service: Patient refused    Active member of club or organization: Patient refused    Attends meetings  of clubs or organizations: Patient refused    Relationship status: Patient refused   Intimate partner violence:    Fear of current or ex partner: Patient refused    Emotionally abused: Patient refused    Physically abused: Patient refused    Forced sexual activity: Patient refused  Other Topics Concern   Not on file  Social History Narrative   Not on file    Family History:    Family History  Problem Relation Age of Onset   Stroke Mother    Diabetes Father    Diabetes Sister    Diabetes Sister    Diabetes Son      ROS:  Please see the history of present illness.  Patient denies fevers, chills, productive cough or hemoptysis. All other ROS reviewed and negative.     Physical Exam/Data:   Vitals:   07/20/18 0747 07/20/18 1100 07/20/18 1104 07/20/18 1520  BP:  (!) 108/53  117/61  Pulse: 78 100  98  Resp: 15 15  (!) 24  Temp:  98 F (36.7 C)  98 F (36.7 C)  TempSrc:  Oral  Oral  SpO2: 100% 93% 95% 96%  Weight:      Height:        Intake/Output Summary (Last 24 hours) at 07/20/2018 1818 Last data filed at 07/20/2018 1500 Gross per 24 hour  Intake 493 ml  Output 400 ml  Net 93 ml   Last 3 Weights 07/20/2018 07/19/2018 07/10/2018  Weight (lbs) 222 lb 3.6 oz 226 lb 229 lb 4.5 oz  Weight (kg) 100.8 kg 102.513 kg  104 kg     Body mass index is 40.65 kg/m.  General:  Well nourished, obese, chronically ill appearing in no acute distress HEENT: normal Neck: suupple Endocrine:  No thryomegaly Vascular: No carotid bruits; FA pulses 2+ bilaterally without bruits  Cardiac:  normal S1, S2; RRR; no murmur  Lungs: Diminished breath sounds throughout with mild rhonchi. Abd: soft, nontender, incisional hernia Ext: 1+ edema Musculoskeletal:  No deformities, BUE and BLE strength normal and equal Skin: warm and dry  Neuro:  CNs 2-12 intact, no focal abnormalities noted Psych:  Normal affect   EKG:  The EKG was personally reviewed and demonstrates: Sinus rhythm with PVCs, inferior infarct and nonspecific ST changes. Telemetry:  Telemetry was personally reviewed and demonstrates: Sinus with PVCs  Laboratory Data:  Chemistry Recent Labs  Lab 07/19/18 2058 07/20/18 0153  NA 141 140  K 4.2 4.7  CL 102 102  CO2 28 27  GLUCOSE 179* 303*  BUN 16 20  CREATININE 1.28* 1.37*  CALCIUM 8.9 8.9  GFRNONAA 44* 41*  GFRAA 52* 47*  ANIONGAP 11 11    Recent Labs  Lab 07/19/18 2058 07/20/18 0153  PROT 5.8* 6.4*  ALBUMIN 2.7* 2.8*  AST 25 125*  ALT 31 86*  ALKPHOS 85 173*  BILITOT 0.4 0.4   Hematology Recent Labs  Lab 07/19/18 2058 07/20/18 0153  WBC 11.0* 27.2*  RBC 3.86* 4.22  HGB 11.6* 12.5  HCT 36.4 39.3  MCV 94.3 93.1  MCH 30.1 29.6  MCHC 31.9 31.8  RDW 14.9 14.8  PLT 228 264   Cardiac Enzymes Recent Labs  Lab 07/19/18 2058 07/20/18 0153 07/20/18 1220 07/20/18 1644  TROPONINI 0.03* 0.04* 0.05* 0.04*   BNP Recent Labs  Lab 07/19/18 2058  BNP 671.6*    Radiology/Studies:  Dg Chest Portable 1 View  Result Date: 07/19/2018 CLINICAL DATA:  63 year old female with shortness of  breath EXAM: PORTABLE CHEST 1 VIEW COMPARISON:  07/10/2018 FINDINGS: Cardiomediastinal silhouette unchanged in size and contour. Calcifications of the aortic arch. No pneumothorax. No pleural effusion. No  confluent airspace disease. Changes of prior PTC I. Surgical changes of the cervical region. No displaced fracture IMPRESSION: Negative for acute cardiopulmonary disease Electronically Signed   By: Corrie Mckusick D.O.   On: 07/19/2018 20:27   Vas [place] Lower Extremity Venous (dvt)  Result Date: 07/20/2018  Lower Venous Study Indications: Edema.  Performing Technologist: Abram Sander RVS  Examination Guidelines: A complete evaluation includes B-mode imaging, spectral Doppler, color Doppler, and power Doppler as needed of all accessible portions of each vessel. Bilateral testing is considered an integral part of a complete examination. Limited examinations for reoccurring indications may be performed as noted.  +-----+---------------+---------+-----------+----------+-------+  RIGHT Compressibility Phasicity Spontaneity Properties Summary  +-----+---------------+---------+-----------+----------+-------+  CFV   Full            Yes       Yes                             +-----+---------------+---------+-----------+----------+-------+   +---------+---------------+---------+-----------+----------+--------------+  LEFT      Compressibility Phasicity Spontaneity Properties Summary         +---------+---------------+---------+-----------+----------+--------------+  CFV       Full            Yes       Yes                                    +---------+---------------+---------+-----------+----------+--------------+  SFJ       Full                                                             +---------+---------------+---------+-----------+----------+--------------+  FV Prox   Full                                                             +---------+---------------+---------+-----------+----------+--------------+  FV Mid    Full                                                             +---------+---------------+---------+-----------+----------+--------------+  FV Distal Full                                                              +---------+---------------+---------+-----------+----------+--------------+  PFV       Full                                                             +---------+---------------+---------+-----------+----------+--------------+  POP       Full            Yes       Yes                                    +---------+---------------+---------+-----------+----------+--------------+  PTV       Full                                                             +---------+---------------+---------+-----------+----------+--------------+  PERO                                                       Not visualized  +---------+---------------+---------+-----------+----------+--------------+     Summary: Right: No evidence of common femoral vein obstruction. Left: There is no evidence of deep vein thrombosis in the lower extremity. However, portions of this examination were limited- see technologist comments above. No cystic structure found in the popliteal fossa.  *See table(s) above for measurements and observations. Electronically signed by Monica Martinez MD on 07/20/2018 at 5:01:05 PM.    Final     Assessment and Plan:   1. Acute on chronic combined systolic/diastolic congestive heart failure-patient appears to be mildly volume overloaded.  I agree with continuing Lasix at 60 mg IV twice daily.  Follow renal function closely.  Needs fluid restriction and low-sodium diet.  Patient's echocardiogram shows ejection fraction 40 to 45%.  Previous echocardiogram in July 2019 interpreted as normal but cardiac catheterization at that time showed ejection fraction 40 to 45%.  Also previous echoes prior to July 2019 typically in the 40 to 45% range.  I do not think there has been a change in LV function.  We will not treat with beta-blockade given severity of lung disease.  Blood pressure is borderline.  Could consider adding low-dose ARB in the future if blood pressure allows and renal function stable with  diuresis. 2. Chest pain-patient describes tightness that increases with lying flat and sitting up and inspiration.  It is almost continuous.  Her electrocardiogram shows no acute ST changes.  Her troponins are flat and not consistent with acute coronary syndrome.  If follow-up enzymes negative I would not pursue further ischemia evaluation. 3. COPD-home O2 dependent.  This is likely contributing to dyspnea as well.  Pulmonary toilet per primary care. 4. Tobacco abuse-patient counseled on discontinuing. 5. Coronary artery disease-continue Plavix and statin. 6. Chronic stage III kidney disease-follow renal function closely with diuresis. 7. Hyperlipidemia-continue statin. 8. Adrenal insufficiency  For questions or updates, please contact [place] Please consult [email or web address] for contact info under     Signed, Kirk Ruths, MD  07/20/2018 6:18 PM

## 2018-07-20 NOTE — Significant Event (Addendum)
Rapid Response Event Note Nursing staff called for acute desaturations to 70s  Overview: Time Called: 2341 Arrival Time: 2344 Event Type: Respiratory  Initial Focused Assessment: Upon arrival, Ms. Longley is not following commands or responsive to painful stimuli, corneals intact, bilateral pupils dilated and responsive at 5. Pt spontaneously breathing RR 30s, diaphoretic and BBS very tight with little anterior air movement, possible wheezing. Pt was on NRB mask at 15L with sats 85%. Reportedly, pt was independently ambulating from the stretcher to the bed on 3L [place] when she went into distress. Then the pt was placed on NRB mask.  Pt placed on Bipap per [place] RRT at 16/8 at 100% with sats 100%. TV 300. Dr. Maudie Mercury notified and came to bedside.  Pt began to awake some after being on the BIPAP for 15-20 mins and eventually was able to answer questions and follow commands. Dr. Maudie Mercury consulted PCCM. PCCM did not feel like patient required ICU or intubation at this time. Repeat ABG at 0130. Plan to remain on Bipap and transfer to progressive care.   Initial ABG right at initiation of BIPAP: 7.01/113/208/27. HR 120s ST, 118/90, RR 30s  ABG 0130- 7.29/58/94/27.5.  HR 120 ST, 132/71, RR 38 with sats 98% on 40% FIO2.   Interventions: -Stat ABG -Stat BIPAP per RRT -Stat Albuterol Neb -Stat PIV  -Stat 12 lead EKG -Solumedrol-given per order -Lasix-given per order   Event Summary: Name of Physician Notified: dr Maudie Mercury at 2344    at    Outcome: Transferred (Comment) Pt transferred to 2C03  Call ended 0215   Madelynn Done

## 2018-07-20 NOTE — Progress Notes (Signed)
Lower extremity venous has been completed.   Preliminary results in CV Proc.   Abram Sander 07/20/2018 10:23 AM

## 2018-07-20 NOTE — Progress Notes (Signed)
eLink Physician-Brief Progress Note Patient Name: Crystal Yates DOB: [birth year] MRN: [id number]   Date of Service  07/20/2018  HPI/Events of Note  Patient with somnolence and iatrogenic hypercapnic respiratory failure after being placed on high FiO2, now more alert and interactive per Dr. Maudie Mercury. ABG was drawn within 5 minutes of being placed on BIPAP and does not reflect current clinical status. Dr. Maudie Mercury wanted to know if ICU transfer was appropriate.  eICU Interventions  I recommended transfer to step down on the BIPAP and recheck ABG at 1 hour mark on BIPAP as long as patient continues to improve clinically. Avoid all sedating medications, limit O2 supplementation to the lowest amount consistent with O2 saturation of 88-90 %.        [place] U Ogan 07/20/2018, 1:16 AM

## 2018-07-20 NOTE — Progress Notes (Addendum)
PROGRESS NOTE    Crystal Yates  MBE:[id number] DOB: [birth year] DOA: 07/19/2018 PCP: Dortha Kern, PA   Brief Narrative:  Per HPI  Crystal Yates  is Crystal Yates 63 y.o. female,w Copd on home o2, CHF (systolic/diastolic), CAD, Dm2, Adrenal insufficiency, Tobacco abuse, apparently presents with c/o of dyspnea for the past week , worse today, + orthopnea, and 10lbs of weight gain per pt, along with lower ext edema.  Pt notes that she has had some vague intermittent chest tightness without radiation for the past 2 weeks.  Pt denies fever, chills, cough, palp, n/v, abd pain, diarrhea, brbpr, dysuria, hematuria.  Pt notes that she was recently admitted for dyspnea and her torsemide was changed to lasix upon discharge.  She contacted cardiology today, and has an appointment for tomorrow but decided couldn't wait. Pt denies any recent travel, sick exposure,  She has been self isolating.   In ED,  T 98 P 93-104  R 21, Bp 131/91, most recent 109/59 pox 94-100% on o2 [place]  Wbc 11.0, hgb 11.6, Plt 228 Na 141, K 4.2,  Bun 16, Creatinne 1.28 (baseline 1.15) Alb 2.7 Ast 25, Alt 31, Alk phos 85, T. Bili 0.4  BNP 671.6 (prev 202.8) Trop 0.03  (prev 0.03)  CXR IMPRESSION: Negative for acute cardiopulmonary disease  Pt will be admitted for refractory dyspnea secondary to CHF (acute) diastolic  Assessment & Plan:   Principal Problem:   Dyspnea Active Problems:   Adrenal insufficiency (HCC)   Coronary artery disease   Diabetes mellitus with no complication (HCC)   Acute respiratory failure with hypercapnia (HCC)   Acute diastolic CHF (congestive heart failure) (HCC)   Acute Hypoxic Respiratory Failure secondary to Heart Failure Exacerbation  Possible Contribution from COPD exacerbation: EF 60-65% with grade 1 dd in 09/2017 Repeat echo pending She tells me she was previously taking 60 mg of torsemide daily (on admission H&P from that admission, it says she took 60 in the morning and 40 in  the evening, will need to clarify), but discharged from the last hospitalization only on 60 mg lasix daily Continue telemetry Check cardiac echo Continue lasix 60 IV BID, continue to monitor I/O, may need increased dose Will need to be discharged on torsemide or higher lasix dose.  I/O, daily weights  Required bipap last night for hypercarbia Of note, tested negative for COVID on 4/14  Wt Readings from Last 3 Encounters:  07/20/18 100.8 kg  07/10/18 104 kg  06/03/18 97.5 kg   Copd on home o2 (2 L [place])  Acute Hypercarbic Respiratory Failure - Required bipap last night for hypercarbia with acidemia She's not wheezing on exam and is comfortable on bipap Solumedrol 9m iv q8h -> wean Will d/c azithromycin given lack of wheezing and mildly prolonged QTC Cont Stiolto Cont Albuterol Wean O2 for goal 88-92%   Abnormal EKG  Mild Troponin Elevation:  T wave inversions in I, aVL.  Appears different than priors.   Will repeat EKG Follow echo Suspect mild troponin elevation is demand Will need outpatient cards follow up  Addendum: Atypical Chest Pain: called to bedside due to atypical CP.  Pt notes she's had on/off CP for the past few weeks.  Reproducible with palpation.  Did not change with nitro.  EKG as noted above with t wave inversions in I and aVL as well as Q waves in II, III, aVF.  Troponin low and flat.  Echo with newly decreased EF with hypokinesis of L ventricular, basal mid inferior  wall and inferolateral wall (see report) Given atypical CP with newly decreased EF and WMA, have consulted cardiology  CAD Cont Plavix Cont Lipitor  H/o pulmonary nodule 44m in left lower lung on CT 07/11/2018 Needs repeat CT scan  in 6 -12 months  Dm2 SSI Cont Levemir 41 units [place] qhs Hold Trulicity, uses on 'Sunday  Hypothyroidism, recent TSH wnl Cont Levothyroxine 150 micrograms po qday  Insomnia Cont Belsomra  Anxiety/ Arthritis Cont Cymbalta  Adrenal insufficiency HOLD  Hydrocortisone 15mg po qam and 5mg po qpm => use solumedrol 80mg iv q8h -> wean  Tobacco use counselled on smoking cessation x 3 minutes Nicotine patch prn  Leukocytosis: 2/2 steroids, follow  Elevated Liver Enzymes: continue to monitor. Follow acute hepatitis panel.  DVT prophylaxis: lovenox Code Status: full  Family Communication: none at bedside Disposition Plan: pending  Consultants:   EICU  Procedures:  none  Antimicrobials:  Anti-infectives (From admission, onward)   Start     Dose/Rate Route Frequency Ordered Stop   07/20/18 0130  azithromycin (ZITHROMAX) 500 mg in sodium chloride 0.9 % 250 mL IVPB  Status:  Discontinued     50' 0 mg 250 mL/hr over 60 Minutes Intravenous Daily at bedtime 07/20/18 0111 07/20/18 1121     Subjective: Feels better on bipap this morning. Asking for something to drink. Says she was discharged on 60 mg lasix.  Previously was taking 60 mg torsemide.  Objective: Vitals:   07/20/18 0700 07/20/18 0727 07/20/18 0747 07/20/18 1104  BP: 114/63     Pulse: 91 91 78   Resp: '18 18 15   ' Temp: 98 F (36.7 C)     TempSrc: Axillary     SpO2: 100% 100% 100% 95%  Weight:      Height:        Intake/Output Summary (Last 24 hours) at 07/20/2018 1108 Last data filed at 07/20/2018 0700 Gross per 24 hour  Intake 253 ml  Output 0 ml  Net 253 ml   Filed Weights   07/19/18 1945 07/20/18 0216  Weight: 102.5 kg 100.8 kg    Examination:  General exam: Appears calm and comfortable on bipap. Respiratory system: Clear to auscultation. Respiratory effort normal. Cardiovascular system: S1 & S2 heard, RRR.  Gastrointestinal system: Abdomen is nondistended, soft and nontender.  Central nervous system: Alert and oriented. No focal neurological deficits. Extremities: bilateral LEE 1-2+, L>R Skin: No rashes, lesions or ulcers Psychiatry: Judgement and insight appear normal. Mood & affect appropriate.     Data Reviewed: I have personally reviewed  following labs and imaging studies  CBC: Recent Labs  Lab 07/19/18 2058 07/20/18 0153  WBC 11.0* 27.2*  NEUTROABS 8.3*  --   HGB 11.6* 12.5  HCT 36.4 39.3  MCV 94.3 93.1  PLT 228 2117  Basic Metabolic Panel: Recent Labs  Lab 07/19/18 2058 07/20/18 0153  NA 141 140  K 4.2 4.7  CL 102 102  CO2 28 27  GLUCOSE 179* 303*  BUN 16 20  CREATININE 1.28* 1.37*  CALCIUM 8.9 8.9   GFR: Estimated Creatinine Clearance: 46.7 mL/min (Hadriel Northup) (by C-G formula based on SCr of 1.37 mg/dL (H)). Liver Function Tests: Recent Labs  Lab 07/19/18 2058 07/20/18 0153  AST 25 125*  ALT 31 86*  ALKPHOS 85 173*  BILITOT 0.4 0.4  PROT 5.8* 6.4*  ALBUMIN 2.7* 2.8*   No results for input(s): LIPASE, AMYLASE in the last 168 hours. No results for input(s): AMMONIA in the last 168 hours.  Coagulation Profile: No results for input(s): INR, PROTIME in the last 168 hours. Cardiac Enzymes: Recent Labs  Lab 07/19/18 2058 07/20/18 0153  TROPONINI 0.03* 0.04*   BNP (last 3 results) Recent Labs    01/31/18 1230 03/15/18 1025  PROBNP 1,553* 858*   HbA1C: No results for input(s): HGBA1C in the last 72 hours. CBG: Recent Labs  Lab 07/20/18 0016 07/20/18 0218 07/20/18 0624  GLUCAP 336* 284* 231*   Lipid Profile: No results for input(s): CHOL, HDL, LDLCALC, TRIG, CHOLHDL, LDLDIRECT in the last 72 hours. Thyroid Function Tests: No results for input(s): TSH, T4TOTAL, FREET4, T3FREE, THYROIDAB in the last 72 hours. Anemia Panel: No results for input(s): VITAMINB12, FOLATE, FERRITIN, TIBC, IRON, RETICCTPCT in the last 72 hours. Sepsis Labs: No results for input(s): PROCALCITON, LATICACIDVEN in the last 168 hours.  No results found for this or any previous visit (from the past 240 hour(s)).       Radiology Studies: Dg Chest Portable 1 View  Result Date: 07/19/2018 CLINICAL DATA:  63 year old female with shortness of breath EXAM: PORTABLE CHEST 1 VIEW COMPARISON:  07/10/2018 FINDINGS:  Cardiomediastinal silhouette unchanged in size and contour. Calcifications of the aortic arch. No pneumothorax. No pleural effusion. No confluent airspace disease. Changes of prior PTC I. Surgical changes of the cervical region. No displaced fracture IMPRESSION: Negative for acute cardiopulmonary disease Electronically Signed   By: Corrie Mckusick D.O.   On: 07/19/2018 20:27   Vas [place] Lower Extremity Venous (dvt)  Result Date: 07/20/2018  Lower Venous Study Indications: Edema.  Performing Technologist: Abram Sander RVS  Examination Guidelines: Kiyanna Biegler complete evaluation includes B-mode imaging, spectral Doppler, color Doppler, and power Doppler as needed of all accessible portions of each vessel. Bilateral testing is considered an integral part of Valen Mascaro complete examination. Limited examinations for reoccurring indications may be performed as noted.  +-----+---------------+---------+-----------+----------+-------+ RIGHTCompressibilityPhasicitySpontaneityPropertiesSummary +-----+---------------+---------+-----------+----------+-------+ CFV  Full           Yes      Yes                          +-----+---------------+---------+-----------+----------+-------+   +---------+---------------+---------+-----------+----------+--------------+ LEFT     CompressibilityPhasicitySpontaneityPropertiesSummary        +---------+---------------+---------+-----------+----------+--------------+ CFV      Full           Yes      Yes                                 +---------+---------------+---------+-----------+----------+--------------+ SFJ      Full                                                        +---------+---------------+---------+-----------+----------+--------------+ FV Prox  Full                                                        +---------+---------------+---------+-----------+----------+--------------+ FV Mid   Full                                                         +---------+---------------+---------+-----------+----------+--------------+  FV DistalFull                                                        +---------+---------------+---------+-----------+----------+--------------+ PFV      Full                                                        +---------+---------------+---------+-----------+----------+--------------+ POP      Full           Yes      Yes                                 +---------+---------------+---------+-----------+----------+--------------+ PTV      Full                                                        +---------+---------------+---------+-----------+----------+--------------+ PERO                                                  Not visualized +---------+---------------+---------+-----------+----------+--------------+     Summary: Right: No evidence of common femoral vein obstruction. Left: There is no evidence of deep vein thrombosis in the lower extremity. However, portions of this examination were limited- see technologist comments above. No cystic structure found in the popliteal fossa.  *See table(s) above for measurements and observations.    Preliminary         Scheduled Meds: . atorvastatin  20 mg Oral QPM  . chlorhexidine  15 mL Mouth Rinse BID  . clopidogrel  75 mg Oral QHS  . DULoxetine  60 mg Oral Daily  . furosemide  60 mg Intravenous BID  . insulin aspart  0-5 Units Subcutaneous QHS  . insulin aspart  0-9 Units Subcutaneous TID WC  . insulin glargine  42 Units Subcutaneous QHS  . levothyroxine  150 mcg Oral QAC breakfast  . magnesium oxide  400 mg Oral Daily  . mouth rinse  15 mL Mouth Rinse q12n4p  . methylPREDNISolone (SOLU-MEDROL) injection  80 mg Intravenous Q8H  . multivitamin with minerals  1 tablet Oral QHS  . potassium chloride SA  20 mEq Oral Daily  . sodium chloride flush  3 mL Intravenous Q12H  . umeclidinium bromide  1 puff Inhalation Daily   Continuous  Infusions: . sodium chloride    . azithromycin Stopped (07/20/18 0331)     LOS: 0 days    Time spent: over 30 min    Fayrene Helper, MD [facility] Pager AMION  If 7PM-7AM, please contact night-coverage [email or web address] Password [email or web address] 07/20/2018, 11:08 AM

## 2018-07-20 NOTE — Progress Notes (Signed)
Pt resting comfortably off bipap at this time. RT will continue to monitor as needed.

## 2018-07-20 NOTE — Progress Notes (Signed)
XCOVER Called due to patient being sob as well as Abnormal ABG Pt had ABG about 5-10 minutes after being placed on Bipap Pt received solumedrol as well as albuterol neb but has not yet received 2nd dose of IV Lasix  Exam: T afebrile 98 P 93-130 Bp    Heent: anicteric, pupils 1.79mm symmetric, pt responsive verbally Neck: slight jvd Heart: tachy s1, s2,  Lung: decrease in bs bilateral base, bilateral exp wheezing Abd: soft, obese Ext: no c/c  1+ edema  PH 7.019 Co2 113, pO2 208  A/P Acute respiratory failure secondary to acute CHF (diastolic), Copd exacerbation Cont Bipap,  Repeat Abg in 15-30 minutes Lasix 40mg  iv x1 Continue solumedrol 80mg  iv tid Cont spiriva Cont albuterol Cont Lasix 60mg  iv bid Repeat CXR in AM D/w critical care physician EICU, recommended stepdown for now, if further decompensation they will accept patient to ICU  Troponin elevation Dc [place] heparin (pt has not yet received) Lovenox 1mg / kg [place] bid Continue to cycle troponin Check cardiac echo   Critical care time 30 minutes

## 2018-07-21 DIAGNOSIS — I2510 Atherosclerotic heart disease of native coronary artery without angina pectoris: Secondary | ICD-10-CM

## 2018-07-21 DIAGNOSIS — I5031 Acute diastolic (congestive) heart failure: Secondary | ICD-10-CM

## 2018-07-21 LAB — GLUCOSE, CAPILLARY
Glucose-Capillary: 194 mg/dL — ABNORMAL HIGH (ref 70–99)
Glucose-Capillary: 157 mg/dL — ABNORMAL HIGH (ref 70–99)
Glucose-Capillary: 190 mg/dL — ABNORMAL HIGH (ref 70–99)
Glucose-Capillary: 202 mg/dL — ABNORMAL HIGH (ref 70–99)

## 2018-07-21 LAB — URINALYSIS, ROUTINE W REFLEX MICROSCOPIC
Bilirubin Urine: NEGATIVE
Glucose, UA: NEGATIVE mg/dL
Hgb urine dipstick: NEGATIVE
Ketones, ur: NEGATIVE mg/dL
Nitrite: NEGATIVE
Protein, ur: NEGATIVE mg/dL
Specific Gravity, Urine: 1.009 (ref 1.005–1.030)
pH: 7 (ref 5.0–8.0)

## 2018-07-21 LAB — BASIC METABOLIC PANEL
Anion gap: 12 (ref 5–15)
BUN: 34 mg/dL — ABNORMAL HIGH (ref 8–23)
CO2: 28 mmol/L (ref 22–32)
Calcium: 8.9 mg/dL (ref 8.9–10.3)
Chloride: 97 mmol/L — ABNORMAL LOW (ref 98–111)
Creatinine, Ser: 1.28 mg/dL — ABNORMAL HIGH (ref 0.44–1.00)
GFR calc Af Amer: 52 mL/min — ABNORMAL LOW (ref >60)
GFR calc non Af Amer: 44 mL/min — ABNORMAL LOW (ref >60)
Glucose, Bld: 177 mg/dL — ABNORMAL HIGH (ref 70–99)
Potassium: 4.6 mmol/L (ref 3.5–5.1)
Sodium: 137 mmol/L (ref 135–145)

## 2018-07-21 LAB — CBC
HCT: 38.1 % (ref 36.0–46.0)
Hemoglobin: 12.3 g/dL (ref 12.0–15.0)
MCH: 30 pg (ref 26.0–34.0)
MCHC: 32.3 g/dL (ref 30.0–36.0)
MCV: 92.9 fL (ref 80.0–100.0)
Platelets: 245 10*3/uL (ref 150–400)
RBC: 4.1 MIL/uL (ref 3.87–5.11)
RDW: 14.8 % (ref 11.5–15.5)
WBC: 18.7 10*3/uL — ABNORMAL HIGH (ref 4.0–10.5)
nRBC: 0 % (ref 0.0–0.2)

## 2018-07-21 LAB — TROPONIN I
Troponin I: 0.04 ng/mL (ref <0.03)
Troponin I: 0.04 ng/mL (ref <0.03)

## 2018-07-21 LAB — HEPATITIS PANEL, ACUTE
HCV Ab: <0.1 s/co ratio (ref 0.0–0.9)
Hep A IgM: NEGATIVE
Hep B C IgM: NEGATIVE
Hepatitis B Surface Ag: NEGATIVE

## 2018-07-21 MED ORDER — HYDROCORTISONE 5 MG PO TABS
5.0000 mg | ORAL_TABLET | Freq: Every day | ORAL | Status: DC
  Filled 2018-07-21: qty 1

## 2018-07-21 MED ORDER — HYDROXYZINE HCL 25 MG PO TABS
25.0000 mg | ORAL_TABLET | Freq: Three times a day (TID) | ORAL | Status: DC | PRN
  Administered 2018-07-21 (×2): 25 mg via ORAL
  Filled 2018-07-21 (×2): qty 1

## 2018-07-21 MED ORDER — HYDROCORTISONE 5 MG PO TABS
15.0000 mg | ORAL_TABLET | Freq: Every day | ORAL | Status: DC
  Administered 2018-07-22: 15 mg via ORAL
  Filled 2018-07-21: qty 1

## 2018-07-21 MED ORDER — PREDNISONE 20 MG PO TABS: 40.0000 mg | ORAL_TABLET | Freq: Every day | ORAL | Status: DC

## 2018-07-21 MED ORDER — HYDROCORTISONE 5 MG PO TABS: 5.0000 mg | ORAL_TABLET | ORAL | Status: DC

## 2018-07-21 MED ORDER — ENOXAPARIN SODIUM 40 MG/0.4ML ~~LOC~~ SOLN
40.0000 mg | Freq: Every day | SUBCUTANEOUS | Status: DC
  Administered 2018-07-21: 40 mg via SUBCUTANEOUS
  Filled 2018-07-21: qty 0.4

## 2018-07-21 NOTE — Progress Notes (Signed)
Progress Note  Patient Name: Crystal Yates Date of Encounter: 07/21/2018  Primary Cardiologist: Jenne Campus, MD   Subjective   Recent increasing pedal edema gaining of 6 pounds over the past 2 weeks.  Chest tightness that seem to be worse with inspiration unlike previous angina.  Her breathing is improved but it still not at baseline.  She does state that she used to take torsemide at home and this seemed to work better for her than Lasix.  Perhaps on discharge we can switch her back to torsemide.  Inpatient Medications    Scheduled Meds:  atorvastatin  20 mg Oral QPM   chlorhexidine  15 mL Mouth Rinse BID   clopidogrel  75 mg Oral QHS   DULoxetine  60 mg Oral Daily   enoxaparin (LOVENOX) injection  40 mg Subcutaneous QHS   furosemide  60 mg Intravenous BID   insulin aspart  0-5 Units Subcutaneous QHS   insulin aspart  0-9 Units Subcutaneous TID WC   insulin glargine  42 Units Subcutaneous QHS   levothyroxine  150 mcg Oral QAC breakfast   magnesium oxide  400 mg Oral Daily   mouth rinse  15 mL Mouth Rinse q12n4p   methylPREDNISolone (SOLU-MEDROL) injection  40 mg Intravenous Q12H   multivitamin with minerals  1 tablet Oral QHS   potassium chloride SA  20 mEq Oral Daily   sodium chloride flush  3 mL Intravenous Q12H   umeclidinium bromide  1 puff Inhalation Daily   Continuous Infusions:  sodium chloride     PRN Meds: sodium chloride, acetaminophen **OR** acetaminophen, albuterol, morphine injection, nitroGLYCERIN, oxyCODONE, polyethylene glycol, sodium chloride flush   Vital Signs    Vitals:   07/21/18 0000 07/21/18 0302 07/21/18 0500 07/21/18 0816  BP:  139/81  119/69  Pulse: 84 88 96 91  Resp: 17 18 (!) 22 (!) 26  Temp:  97.9 F (36.6 C)  97.9 F (36.6 C)  TempSrc:  Oral  Oral  SpO2: 97% 95% 97% 97%  Weight:  103.3 kg    Height:        Intake/Output Summary (Last 24 hours) at 07/21/2018 0913 Last data filed at 07/21/2018  0818 Gross per 24 hour  Intake 1200 ml  Output 550 ml  Net 650 ml   Last 3 Weights 07/21/2018 07/20/2018 07/19/2018  Weight (lbs) 227 lb 11.8 oz 222 lb 3.6 oz 226 lb  Weight (kg) 103.3 kg 100.8 kg 102.513 kg      Telemetry    NSR - Personally Reviewed  ECG    NSR - Personally Reviewed  Physical Exam   GEN: No acute distress.   Neck: No JVD Cardiac: RRR, no murmurs, rubs, or gallops.  Respiratory: Clear to auscultation bilaterally. GI: Soft, nontender, non-distended  MS: No edema; No deformity. Neuro:  Nonfocal  Psych: Normal affect   Labs    Chemistry Recent Labs  Lab 07/19/18 2058 07/20/18 0153  NA 141 140  K 4.2 4.7  CL 102 102  CO2 28 27  GLUCOSE 179* 303*  BUN 16 20  CREATININE 1.28* 1.37*  CALCIUM 8.9 8.9  PROT 5.8* 6.4*  ALBUMIN 2.7* 2.8*  AST 25 125*  ALT 31 86*  ALKPHOS 85 173*  BILITOT 0.4 0.4  GFRNONAA 44* 41*  GFRAA 52* 47*  ANIONGAP 11 11     Hematology Recent Labs  Lab 07/19/18 2058 07/20/18 0153  WBC 11.0* 27.2*  RBC 3.86* 4.22  HGB 11.6* 12.5  HCT  36.4 39.3  MCV 94.3 93.1  MCH 30.1 29.6  MCHC 31.9 31.8  RDW 14.9 14.8  PLT 228 264    Cardiac Enzymes Recent Labs  Lab 07/20/18 1220 07/20/18 1644 07/20/18 2308 07/21/18 0537  TROPONINI 0.05* 0.04* 0.04* 0.04*   No results for input(s): TROPIPOC in the last 168 hours.   BNP Recent Labs  Lab 07/19/18 2058  BNP 671.6*     DDimer No results for input(s): DDIMER in the last 168 hours.   Radiology    Dg Chest Portable 1 View  Result Date: 07/19/2018 CLINICAL DATA:  63 year old female with shortness of breath EXAM: PORTABLE CHEST 1 VIEW COMPARISON:  07/10/2018 FINDINGS: Cardiomediastinal silhouette unchanged in size and contour. Calcifications of the aortic arch. No pneumothorax. No pleural effusion. No confluent airspace disease. Changes of prior PTC I. Surgical changes of the cervical region. No displaced fracture IMPRESSION: Negative for acute cardiopulmonary disease  Electronically Signed   By: Corrie Mckusick D.O.   On: 07/19/2018 20:27   Vas [place] Lower Extremity Venous (dvt)  Result Date: 07/20/2018  Lower Venous Study Indications: Edema.  Performing Technologist: Abram Sander RVS  Examination Guidelines: A complete evaluation includes B-mode imaging, spectral Doppler, color Doppler, and power Doppler as needed of all accessible portions of each vessel. Bilateral testing is considered an integral part of a complete examination. Limited examinations for reoccurring indications may be performed as noted.  +-----+---------------+---------+-----------+----------+-------+  RIGHT Compressibility Phasicity Spontaneity Properties Summary  +-----+---------------+---------+-----------+----------+-------+  CFV   Full            Yes       Yes                             +-----+---------------+---------+-----------+----------+-------+   +---------+---------------+---------+-----------+----------+--------------+  LEFT      Compressibility Phasicity Spontaneity Properties Summary         +---------+---------------+---------+-----------+----------+--------------+  CFV       Full            Yes       Yes                                    +---------+---------------+---------+-----------+----------+--------------+  SFJ       Full                                                             +---------+---------------+---------+-----------+----------+--------------+  FV Prox   Full                                                             +---------+---------------+---------+-----------+----------+--------------+  FV Mid    Full                                                             +---------+---------------+---------+-----------+----------+--------------+  FV Distal Full                                                             +---------+---------------+---------+-----------+----------+--------------+  PFV       Full                                                              +---------+---------------+---------+-----------+----------+--------------+  POP       Full            Yes       Yes                                    +---------+---------------+---------+-----------+----------+--------------+  PTV       Full                                                             +---------+---------------+---------+-----------+----------+--------------+  PERO                                                       Not visualized  +---------+---------------+---------+-----------+----------+--------------+     Summary: Right: No evidence of common femoral vein obstruction. Left: There is no evidence of deep vein thrombosis in the lower extremity. However, portions of this examination were limited- see technologist comments above. No cystic structure found in the popliteal fossa.  *See table(s) above for measurements and observations. Electronically signed by Monica Martinez MD on 07/20/2018 at 5:01:05 PM.    Final     Cardiac Studies   EF on echocardiogram previously 40 to 45%. Catheterization prior PCI right coronary artery  Patient Profile     63 y.o. female with acute on chronic diastolic heart failure symptoms, atypical chest pain, COPD, hypertension, hyperlipidemia, diabetes  Assessment & Plan    Acute on chronic combined systolic and diastolic heart failure - IV Lasix 60 mg twice a day.  Let us continue this for now.  EF 40 to 45% appears consistent even though prior echocardiogram showed a more normal EF, was likely still in this range. -Avoiding beta-blocker because of lung disease.  Blood pressure borderline.  If able in the future low-dose angiotensin receptor blocker may be helpful.  Coronary artery disease - Prior RCA PCI.  Plavix, statin.  Secondary prevention.  Atypical chest pain - No ischemic changes on ECG troponins are low and flat consistent with demand ischemia in the setting of her shortness of breath.  No further ischemic evaluation  COPD -O2  dependent at home.  Contributing factor.  Tobacco use - Continue to encourage cessation.  Adrenal insufficiency -Per primary team        For questions or updates,  please contact [place] Please consult [email or web address] for contact info under        Signed, Candee Furbish, MD  07/21/2018, 9:13 AM

## 2018-07-21 NOTE — Progress Notes (Signed)
CCMD called at 1408 to notify RN of pt w/9beats of vtach.  RN to bedside for patient assessment.  Pt reports she just felt pressure, as if someone was squeezing her from behind.  VSS -BP 128/92; HR 95; RR16; sats 98% on O2 @ 2L/[place].  MD paged.  Dr. Margaretann Loveless, cardiology, returned call; no new orders received.  Will continue to monitor and notify physician if any other issues present.

## 2018-07-21 NOTE — Progress Notes (Signed)
**Note De-Identified vi Obfusction** PROGRESS NOTE    Crystal Yates  BWG:[id number] DOB: 1955-07-31 DOA: 07/19/2018 PCP: Dorth Kern, PA   Brief Nrrtive:  Per HPI  Crystal Yates  is  63 y.o. femle,w Copd on home o2, CHF (systolic/distolic), CAD, Dm2, Adrenl insufficiency, Tobcco buse, pprently presents with c/o of dyspne for the pst week , worse tody, + orthopne, nd 10lbs of weight gin per pt, long with lower ext edem.  Pt notes tht she hs hd some vgue intermittent chest tightness without rdition for the pst 2 weeks.  Pt denies fever, chills, cough, plp, n/v, bd pin, dirrhe, brbpr, dysuri, hemturi.  Pt notes tht she ws recently dmitted for dyspne nd her torsemide ws chnged to lsix upon dischrge.  She contcted crdiology tody, nd hs n ppointment for tomorrow but decided couldn't wit. Pt denies ny recent trvel, sick exposure,  She hs been self isolting.   In ED,  T 98 P 93-104  R 21, Bp 131/91, most recent 109/59 pox 94-100% on o2 [place]  Wbc 11.0, hgb 11.6, Plt 228 N 141, K 4.2,  Bun 16, Cretinne 1.28 (bseline 1.15) Alb 2.7 Ast 25, Alt 31, Alk phos 85, T. Bili 0.4  BNP 671.6 (prev 202.8) Trop 0.03  (prev 0.03)  CXR IMPRESSION: Negtive for cute crdiopulmonry disese  Pt will be dmitted for refrctory dyspne secondry to CHF (cute) distolic  Assessment & Pln:   Principl Problem:   Dyspne Active Problems:   Adrenl insufficiency (HCC)   Coronry rtery disese   Dibetes mellitus with no compliction (HCC)   Acute respirtory filure with hypercpni (HCC)   Acute distolic CHF (congestive hert filure) (HCC)   Acute Hypoxic Respirtory Filure secondry to Hert Filure Excerbtion  Possible Contribution from COPD excerbtion: EF 60-65% with grde 1 dd in 09/2017 Repet echo with severe hypokinesis of L ventriculr, bsl-mid inferior wll nd inferolterl wll.  EF 40-45%.  Distolic dysfunction.  See report.  She tells me  she ws previously tking 60 mg of torsemide dily, but dischrged from the lst hospitliztion only on 60 mg lsix dily Continue telemetry Check crdic echo Continue lsix 60 IV BID, continue to monitor I/O, my need incresed dose Will need to be dischrged on torsemide or higher lsix dose.  I/O, dily weights  Of note, tested negtive for COVID on 4/14 LLE [place] negtive for DVT  Wt [place] from Lst 3 Encounters:  07/21/18 103.3 kg  07/10/18 104 kg  06/03/18 97.5 kg   Copd on home o2 (2 L [place])  Acute Hypercrbic Respirtory Filure - Required bipp on dmission for hypercrbi with cidemi She's not wheezing on exm nd is comfortble t this time Solumedrol -> home hydrocortisone 4/26 Will d/c zithromycin given lck of wheezing nd mildly prolonged QTC Cont Stiolto Cont Albuterol Wen O2 for gol 88-92%   Abnorml EKG  Mild Troponin Elevtion  Atypicl Chest Pin:  T wve inversions in I, VL.  Appers different thn priors.   Will repet EKG Follow echo (s bove) Troponin low nd flt Crds c/s given c/o CP with echo with decresed EF nd wm.  Per crds, no further ischemic evl.  Per crds, low suspicion for chnge in LV function s prior echos hd EF in 40% rnge. Will need outptient crds follow up  CAD Cont Plvix Cont Lipitor  H/o pulmonry nodule 71m in left lower lung on CT 07/11/2018 Needs repet CT scn  in 6 -12 months  Dm2 SSI Cont Levemir 41 units Cmp [place] qhs Hold **Note De-Identified vi Obfusction** Trulicity, uses on '[place]  Hypothyroidism, recent TSH wnl Cont Levothyroxine 150 microgrms po qdy  Insomni Cont Belsomr  nxiety/ rthritis Cont Cymblt dd hydroxyzine for nxiety this M  drenl insufficiency HOLD Hydrocortisone 15mg po qm nd 5mg po qpm => use solumedrol 80mg iv q8h -> wen -> home hydrocortisone 4/26  Tobcco use counselled on smoking cesstion x 3 minutes Nicotine ptch prn  Leukocytosis: 2/2 steroids, follow  Elevted Liver Enzymes:  continue to monitor. Follow cute heptitis pnel (negtive).  Urinry Incontinence: follow up outptient, follow U  DVT prophylxis: lovenox Code Sttus: full  Fmily Communiction: none t bedside Disposition Pln: pending  Consultnts:   EICU  Procedures:  none  ntimicrobils:  nti-infectives (From dmission, onwrd)   Strt     Dose/Rte Route Frequency Ordered Stop   07/20/18 0130  zithromycin (ZITHROMX) 500 mg in sodium chloride 0.9 % 250 mL IVPB  Sttus:  Discontinued     50' 0 mg 250 mL/hr over 60 Minutes Intrvenous Dily t bedtime 07/20/18 0111 07/20/18 1121     Subjective: sking for something for nxiety SOB when up nd bout. Hd incontinence erlier, this is chronic.  Objective: Vitls:   07/21/18 0302 07/21/18 0500 07/21/18 0816 07/21/18 1104  BP: 139/81  119/69 112/76  Pulse: 88 96 91 88  Resp: 18 (!) 22 (!) 26 20  Temp: 97.9 F (36.6 C)  97.9 F (36.6 C) 97.9 F (36.6 C)  TempSrc: Orl  Orl Orl  SpO2: 95% 97% 97% 98%  Weight: 103.3 kg     Height:        Intke/Output Summry (Lst 24 hours) t 07/21/2018 1421 Lst dt filed t 07/21/2018 1300 Gross per 24 hour  Intke 1200 ml  Output 150 ml  Net 1050 ml   Filed Weights   07/19/18 1945 07/20/18 0216 07/21/18 0302  Weight: 102.5 kg 100.8 kg 103.3 kg    Exmintion:  Generl: No cute distress. Crdiovsculr: Hert sounds show  regulr rte, nd rhythm. Lungs: Cler to usculttion bilterlly bdomen: Soft, nontender, nondistended  Neurologicl: lert nd oriented 3. Moves ll extremities 4 . Crnil nerves II through XII grossly intct. Skin: Wrm nd dry. No rshes or lesions. Extremities: L>R LEE Psychitric: Mood nd ffect re nxious. Insight nd judgment re pproprite.   Dt Reviewed: I hve personlly reviewed following lbs nd imging studies  CBC: Recent Lbs  Lb 07/19/18 2058 07/20/18 0153 07/21/18 1006  WBC 11.0* 27.2* 18.7*  NEUTROBS 8.3*  --   --    HGB 11.6* 12.5 12.3  HCT 36.4 39.3 38.1  MCV 94.3 93.1 92.9  PLT 228 264 022   Bsic Metbolic Pnel: Recent Lbs  Lb 07/19/18 2058 07/20/18 0153 07/21/18 1006  N 141 140 137  K 4.2 4.7 4.6  CL 102 102 97*  CO2 '28 27 28  ' GLUCOSE 179* 303* 177*  BUN 16 20 34*  CRETININE 1.28* 1.37* 1.28*  CLCIUM 8.9 8.9 8.9   GFR: Estimted Cretinine Clernce: 50.7 mL/min () (by [place] formul bsed on SCr of 1.28 mg/dL (H)). Liver Function Tests: Recent Lbs  Lb 07/19/18 2058 07/20/18 0153  ST 25 125*  LT 31 86*  LKPHOS 85 173*  BILITOT 0.4 0.4  PROT 5.8* 6.4*  LBUMIN 2.7* 2.8*   No results for input(s): LIPSE, MYLSE in the lst 168 hours. No results for input(s): MMONI in the lst 168 hours. Cogultion Profile: No results for input(s): INR, PROTIME in the lst 168 hours. Crdic Enzymes: Recent Lbs **Note De-Identified vi Obfusction** Lb 07/20/18 0153 07/20/18 1220 07/20/18 1644 07/20/18 2308 07/21/18 0537  TROPONINI 0.04* 0.05* 0.04* 0.04* 0.04*   BNP (lst 3 results) Recent Lbs    01/31/18 1230 03/15/18 1025  PROBNP 1,553* 858*   Hb1C: No results for input(s): HGB1C in the lst 72 hours. CBG: Recent Lbs  Lb 07/20/18 1124 07/20/18 1619 07/20/18 2116 07/21/18 0545 07/21/18 1107  GLUCP 313* 268* 249* 194* 157*   Lipid Profile: No results for input(s): CHOL, HDL, LDLCLC, TRIG, CHOLHDL, LDLDIRECT in the lst 72 hours. Thyroid Function Tests: No results for input(s): TSH, T4TOTL, FREET4, T3FREE, THYROIDB in the lst 72 hours. nemi Pnel: No results for input(s): VITMINB12, FOLTE, FERRITIN, TIBC, IRON, RETICCTPCT in the lst 72 hours. Sepsis Lbs: No results for input(s): PROCLCITON, LTICCIDVEN in the lst 168 hours.  No results found for this or ny previous visit (from the pst 240 hour(s)).       Rdiology Studies: Dg Chest Portble 1 View  Result Dte: 07/19/2018 CLINICL DT:  50 yer old femle with shortness of breth EXM: PORTBLE CHEST 1 VIEW  COMPRISON:  07/10/2018 FINDINGS: Crdiomedistinl silhouette unchnged in size nd contour. Clcifictions of the ortic rch. No pneumothorx. No pleurl effusion. No confluent irspce disese. Chnges of prior PTC I. Surgicl chnges of the cervicl region. No displced frcture IMPRESSION: Negtive for cute crdiopulmonry disese Electroniclly Signed   By: Corrie Mckusick D.O.   On: 07/19/2018 20:27   Vs [place] Lower Extremity Venous (dvt)  Result Dte: 07/20/2018  Lower Venous Study Indictions: [place].  Performing Technologist: brm Snder RVS  Exmintion Guidelines:  complete evlution includes B-mode imging, spectrl Doppler, color Doppler, nd power Doppler s needed of ll ccessible portions of ech vessel. Bilterl testing is considered n integrl prt of  complete exmintion. Limited exmintions for reoccurring indictions my be performed s noted.  +-----+---------------+---------+-----------+----------+-------+ RIGHTCompressibilityPhsicitySpontneityPropertiesSummry +-----+---------------+---------+-----------+----------+-------+ CFV  Full           Yes      Yes                          +-----+---------------+---------+-----------+----------+-------+   +---------+---------------+---------+-----------+----------+--------------+ LEFT     CompressibilityPhsicitySpontneityPropertiesSummry        +---------+---------------+---------+-----------+----------+--------------+ CFV      Full           Yes      Yes                                 +---------+---------------+---------+-----------+----------+--------------+ SFJ      Full                                                        +---------+---------------+---------+-----------+----------+--------------+ FV Prox  Full                                                        +---------+---------------+---------+-----------+----------+--------------+ FV Mid   Full                                                         +---------+---------------+---------+-----------+----------+--------------+ FV DistalFull                                                        +---------+---------------+---------+-----------+----------+--------------+ PFV      Full                                                        +---------+---------------+---------+-----------+----------+--------------+ POP      Full           Yes      Yes                                 +---------+---------------+---------+-----------+----------+--------------+ PTV      Full                                                        +---------+---------------+---------+-----------+----------+--------------+ PERO                                                  Not visualized +---------+---------------+---------+-----------+----------+--------------+     Summary: Right: No evidence of common femoral vein obstruction. Left: There is no evidence of deep vein thrombosis in the lower extremity. However, portions of this examination were limited- see technologist comments above. No cystic structure found in the popliteal fossa.  *See table(s) above for measurements and observations. Electronically signed by Monica Martinez MD on 07/20/2018 at 5:01:05 PM.    Final         Scheduled Meds: . atorvastatin  20 mg Oral QPM  . chlorhexidine  15 mL Mouth Rinse BID  . clopidogrel  75 mg Oral QHS  . DULoxetine  60 mg Oral Daily  . enoxaparin (LOVENOX) injection  40 mg Subcutaneous QHS  . furosemide  60 mg Intravenous BID  . insulin aspart  0-5 Units Subcutaneous QHS  . insulin aspart  0-9 Units Subcutaneous TID WC  . insulin glargine  42 Units Subcutaneous QHS  . levothyroxine  150 mcg Oral QAC breakfast  . magnesium oxide  400 mg Oral Daily  . mouth rinse  15 mL Mouth Rinse q12n4p  . methylPREDNISolone (SOLU-MEDROL) injection  40 mg Intravenous Q12H  . multivitamin with minerals  1 tablet Oral QHS  .  potassium chloride SA  20 mEq Oral Daily  . sodium chloride flush  3 mL Intravenous Q12H  . umeclidinium bromide  1 puff Inhalation Daily   Continuous Infusions: . sodium chloride       LOS: 1 day    Time spent: over 30 min    Fayrene Helper, MD [facility] Pager AMION  If 7PM-7AM, please contact night-coverage [email or web address] Password [email or web address] 07/21/2018, 2:21 PM

## 2018-07-22 LAB — CBC
HCT: 36.8 % (ref 36.0–46.0)
Hemoglobin: 11.9 g/dL — ABNORMAL LOW (ref 12.0–15.0)
MCH: 30.1 pg (ref 26.0–34.0)
MCHC: 32.3 g/dL (ref 30.0–36.0)
MCV: 93.2 fL (ref 80.0–100.0)
Platelets: 230 10*3/uL (ref 150–400)
RBC: 3.95 MIL/uL (ref 3.87–5.11)
RDW: 15 % (ref 11.5–15.5)
WBC: 14.7 10*3/uL — ABNORMAL HIGH (ref 4.0–10.5)
nRBC: 0 % (ref 0.0–0.2)

## 2018-07-22 LAB — COMPREHENSIVE METABOLIC PANEL
ALT: 42 U/L (ref 0–44)
AST: 22 U/L (ref 15–41)
Albumin: 2.6 g/dL — ABNORMAL LOW (ref 3.5–5.0)
Alkaline Phosphatase: 110 U/L (ref 38–126)
Anion gap: 9 (ref 5–15)
BUN: 34 mg/dL — ABNORMAL HIGH (ref 8–23)
CO2: 33 mmol/L — ABNORMAL HIGH (ref 22–32)
Calcium: 9.4 mg/dL (ref 8.9–10.3)
Chloride: 99 mmol/L (ref 98–111)
Creatinine, Ser: 1.23 mg/dL — ABNORMAL HIGH (ref 0.44–1.00)
GFR calc Af Amer: 54 mL/min — ABNORMAL LOW (ref >60)
GFR calc non Af Amer: 47 mL/min — ABNORMAL LOW (ref >60)
Glucose, Bld: 108 mg/dL — ABNORMAL HIGH (ref 70–99)
Potassium: 4.5 mmol/L (ref 3.5–5.1)
Sodium: 141 mmol/L (ref 135–145)
Total Bilirubin: 0.4 mg/dL (ref 0.3–1.2)
Total Protein: 6.3 g/dL — ABNORMAL LOW (ref 6.5–8.1)

## 2018-07-22 LAB — GLUCOSE, CAPILLARY: Glucose-Capillary: 149 mg/dL — ABNORMAL HIGH (ref 70–99)

## 2018-07-22 LAB — MAGNESIUM: Magnesium: 2 mg/dL (ref 1.7–2.4)

## 2018-07-22 MED ORDER — TORSEMIDE 20 MG PO TABS: 80.0000 mg | ORAL_TABLET | Freq: Every day | ORAL | 0 refills | Status: DC

## 2018-07-22 MED ORDER — TORSEMIDE 20 MG PO TABS: 60.0000 mg | ORAL_TABLET | Freq: Every day | ORAL | Status: DC

## 2018-07-22 NOTE — Plan of Care (Signed)

## 2018-07-22 NOTE — Progress Notes (Signed)
Explained and discussed discharge instructions with patient. Prescription sent electronically to [facility] in [place]. Pt went home with husband and belongings.

## 2018-07-22 NOTE — Progress Notes (Signed)
Progress Note  Patient Name: Crystal Yates Date of Encounter: 07/22/2018  Primary Cardiologist: Jenne Campus, MD   Subjective   Recent increasing pedal edema gaining of 6 pounds over the past 2 weeks.  Chest tightness that seemed to be worse with inspiration unlike previous angina.  Doing well this morning.  Breathing is much improved.  No chest pain.  Eager to go home.  Inpatient Medications    Scheduled Meds: . atorvastatin  20 mg Oral QPM  . chlorhexidine  15 mL Mouth Rinse BID  . clopidogrel  75 mg Oral QHS  . DULoxetine  60 mg Oral Daily  . enoxaparin (LOVENOX) injection  40 mg Subcutaneous QHS  . hydrocortisone  15 mg Oral Q breakfast   And  . hydrocortisone  5 mg Oral Q supper  . insulin aspart  0-5 Units Subcutaneous QHS  . insulin aspart  0-9 Units Subcutaneous TID WC  . insulin glargine  42 Units Subcutaneous QHS  . levothyroxine  150 mcg Oral QAC breakfast  . magnesium oxide  400 mg Oral Daily  . mouth rinse  15 mL Mouth Rinse q12n4p  . multivitamin with minerals  1 tablet Oral QHS  . potassium chloride SA  20 mEq Oral Daily  . sodium chloride flush  3 mL Intravenous Q12H  . torsemide  60 mg Oral Daily  . umeclidinium bromide  1 puff Inhalation Daily   Continuous Infusions: . sodium chloride     PRN Meds: sodium chloride, acetaminophen **OR** acetaminophen, albuterol, hydrOXYzine, morphine injection, nitroGLYCERIN, oxyCODONE, polyethylene glycol, sodium chloride flush   Vital Signs    Vitals:   07/22/18 0000 07/22/18 0300 07/22/18 0400 07/22/18 0747  BP: 119/66 128/82 113/74 (!) 119/92  Pulse: 69 90 72 95  Resp: 13 16 14  (!) 38  Temp:  97.8 F (36.6 C)  98.2 F (36.8 C)  TempSrc:  Oral  Oral  SpO2: 98% 98% 99% 99%  Weight:  100.9 kg    Height:        Intake/Output Summary (Last 24 hours) at 07/22/2018 0903 Last data filed at 07/22/2018 0759 Gross per 24 hour  Intake 960 ml  Output 1600 ml  Net -640 ml   Last 3 Weights  07/22/2018 07/21/2018 07/20/2018  Weight (lbs) 222 lb 7.1 oz 227 lb 11.8 oz 222 lb 3.6 oz  Weight (kg) 100.9 kg 103.3 kg 100.8 kg      Telemetry    NSR, rare PVC. Occasional triplet, Short 8 beat run of intermittent ectopic beats.  - Personally Reviewed  ECG    NSR - Personally Reviewed  Physical Exam   GEN: No acute distress.   Neck: No JVD Cardiac: RRR, no murmurs, rubs, or gallops.  Respiratory: Clear to auscultation bilaterally. GI: Soft, nontender, non-distended  MS: No edema; No deformity. Neuro:  Nonfocal  Psych: Normal affect   Labs    Chemistry Recent Labs  Lab 07/19/18 2058 07/20/18 0153 07/21/18 1006 07/22/18 0237  NA 141 140 137 141  K 4.2 4.7 4.6 4.5  CL 102 102 97* 99  CO2 28 27 28  33*  GLUCOSE 179* 303* 177* 108*  BUN 16 20 34* 34*  CREATININE 1.28* 1.37* 1.28* 1.23*  CALCIUM 8.9 8.9 8.9 9.4  PROT 5.8* 6.4*  --  6.3*  ALBUMIN 2.7* 2.8*  --  2.6*  AST 25 125*  --  22  ALT 31 86*  --  42  ALKPHOS 85 173*  --  110  BILITOT 0.4 0.4  --  0.4  GFRNONAA 44* 41* 44* 47*  GFRAA 52* 47* 52* 54*  ANIONGAP 11 11 12 9      Hematology Recent Labs  Lab 07/20/18 0153 07/21/18 1006 07/22/18 0237  WBC 27.2* 18.7* 14.7*  RBC 4.22 4.10 3.95  HGB 12.5 12.3 11.9*  HCT 39.3 38.1 36.8  MCV 93.1 92.9 93.2  MCH 29.6 30.0 30.1  MCHC 31.8 32.3 32.3  RDW 14.8 14.8 15.0  PLT 264 245 230    Cardiac Enzymes Recent Labs  Lab 07/20/18 1220 07/20/18 1644 07/20/18 2308 07/21/18 0537  TROPONINI 0.05* 0.04* 0.04* 0.04*   No results for input(s): TROPIPOC in the last 168 hours.   BNP Recent Labs  Lab 07/19/18 2058  BNP 671.6*     DDimer No results for input(s): DDIMER in the last 168 hours.   Radiology    Vas [place] Lower Extremity Venous (dvt)  Result Date: 07/20/2018  Lower Venous Study Indications: Edema.  Performing Technologist: Abram Sander RVS  Examination Guidelines: A complete evaluation includes B-mode imaging, spectral Doppler, color Doppler, and  power Doppler as needed of all accessible portions of each vessel. Bilateral testing is considered an integral part of a complete examination. Limited examinations for reoccurring indications may be performed as noted.  +-----+---------------+---------+-----------+----------+-------+ RIGHTCompressibilityPhasicitySpontaneityPropertiesSummary +-----+---------------+---------+-----------+----------+-------+ CFV  Full           Yes      Yes                          +-----+---------------+---------+-----------+----------+-------+   +---------+---------------+---------+-----------+----------+--------------+ LEFT     CompressibilityPhasicitySpontaneityPropertiesSummary        +---------+---------------+---------+-----------+----------+--------------+ CFV      Full           Yes      Yes                                 +---------+---------------+---------+-----------+----------+--------------+ SFJ      Full                                                        +---------+---------------+---------+-----------+----------+--------------+ FV Prox  Full                                                        +---------+---------------+---------+-----------+----------+--------------+ FV Mid   Full                                                        +---------+---------------+---------+-----------+----------+--------------+ FV DistalFull                                                        +---------+---------------+---------+-----------+----------+--------------+ PFV      Full                                                        +---------+---------------+---------+-----------+----------+--------------+  POP      Full           Yes      Yes                                 +---------+---------------+---------+-----------+----------+--------------+ PTV      Full                                                         +---------+---------------+---------+-----------+----------+--------------+ PERO                                                  Not visualized +---------+---------------+---------+-----------+----------+--------------+     Summary: Right: No evidence of common femoral vein obstruction. Left: There is no evidence of deep vein thrombosis in the lower extremity. However, portions of this examination were limited- see technologist comments above. No cystic structure found in the popliteal fossa.  *See table(s) above for measurements and observations. Electronically signed by Monica Martinez MD on 07/20/2018 at 5:01:05 PM.    Final     Cardiac Studies   EF on echocardiogram previously 40 to 45%. Catheterization prior PCI right coronary artery.  No new studies  Patient Profile     63 y.o. female with acute on chronic diastolic heart failure symptoms, atypical chest pain, COPD, hypertension, hyperlipidemia, diabetes  Assessment & Plan    Acute on chronic combined systolic and diastolic heart failure - IV Lasix 60 mg twice a day.   EF 40 to 45% appears consistent even though prior echocardiogram showed a more normal EF, was likely still in this range. -Avoiding beta-blocker because of lung disease.  Blood pressure borderline.  If able in the future low-dose angiotensin receptor blocker may be helpful. - Creatinine 1.23. - Okay with discharge but instead of torsemide 60 mg once a day let us give her torsemide 80 mg once a day  Coronary artery disease - Prior RCA PCI.  Plavix, statin.  Secondary prevention.  Overall stable.  Nonsustained VT - Few ectopic beats noted on telemetry.  Minimally reduced ejection fraction.  No symptoms.  Continue with aggressive secondary prevention.  Atypical chest pain - No ischemic changes on ECG troponins are low and flat consistent with demand ischemia in the setting of her shortness of breath.  No further ischemic evaluation unless symptoms were to  progress.  For now continue with optimal medical therapy.  COPD -O2 dependent at home.  Contributing factor to her shortness of breath.  Tobacco use - Continue to encourage cessation.  Adrenal insufficiency -Per primary team  I am comfortable with her being discharged home.  Torsemide 80 mg once a day would be reasonable. We will set up a close follow-up.  It would be helpful to obtain a basic metabolic profile in 2 weeks as well if possible.      For questions or updates, please contact [place] Please consult [email or web address] for contact info under        Signed, Candee Furbish, MD  07/22/2018, 9:03 AM

## 2018-07-22 NOTE — Discharge Summary (Signed)
Physician Discharge Summary  Crystal Yates NTI:[id number] DOB: 1955-05-30 DOA: 07/19/2018  PCP: Dortha Kern, PA  Admit date: 07/19/2018 Discharge date: 07/22/2018  Time spent: 35 minutes  Recommendations for Outpatient Follow-up:  1. Follow up outpatient CBC/CMP 2. Follow up volume status/torsemide dose/kidney function  3. Consider ARB going forward (on hold due to soft BP)  Discharge Diagnoses:  Principal Problem:   Dyspnea Active Problems:   Adrenal insufficiency (HCC)   Coronary artery disease   Diabetes mellitus with no complication (HCC)   Acute respiratory failure with hypercapnia (HCC)   Acute diastolic CHF (congestive heart failure) ([place])   Discharge Condition: stable  Diet recommendation: heart healthy  Filed Weights   07/20/18 0216 07/21/18 0302 07/22/18 0300  Weight: 100.8 kg 103.3 kg 100.9 kg    History of present illness:  Per HPI SherryLafranceis a63 y.o.female,w Copd on home o2, CHF (systolic/diastolic), CAD, Dm2, Adrenal insufficiency, Tobacco abuse, apparently presents with c/o of dyspnea for the past week , worse today, + orthopnea, and 10lbs of weight gain per pt, along with lower ext edema. Pt notes that she has had some vague intermittent chest tightness without radiation for the past 2 weeks. Pt denies fever, chills, cough, palp, n/v, abd pain, diarrhea, brbpr, dysuria, hematuria. Pt notes that she was recently admitted for dyspnea and her torsemide was changed to lasix upon discharge. She contacted cardiology today, and has an appointment for tomorrow but decided couldn't wait. Pt denies any recent travel, sick exposure, She has been self isolating.   In ED,  T 98 P 93-104 R 21, Bp 131/91, most recent 109/59 pox 94-100% on o2 [place]  Wbc 11.0, hgb 11.6, Plt 228 Na 141, K 4.2, Bun 16, Creatinne 1.28 (baseline 1.15) Alb 2.7 Ast 25, Alt 31, Alk phos 85, T. Bili 0.4  BNP 671.6 (prev 202.8) Trop 0.03 (prev  0.03)  CXR IMPRESSION: Negative for acute cardiopulmonary disease  Pt will be admitted for refractory dyspnea secondary to CHF (acute)diastolic  She was admitted for a heart failure exacerbation.  She improved with IV lasix.  Cardiology was c/s due to concern of CP and abnormal echo, but felt findings were chronic.   See below for additional details.  Hospital Course:  Acute Hypoxic Respiratory Failure secondary to Heart Failure Exacerbation  Possible Contribution from COPD exacerbation: EF 60-65% with grade 1 dd in 09/2017 Repeat echo with severe hypokinesis of L ventricular, basal-mid inferior wall and inferolateral wall.  EF 40-45%.  Diastolic dysfunction.  See report.  She tells me she was previously taking 60 mg of torsemide daily, but discharged from the last hospitalization only on 60 mg lasix daily Continue telemetry Check cardiac echo Continue lasix 60 IV BID, continue to monitor I/O, may need increased dose Discharged on 80 mg torsemide daily I/O, daily weights  Of note, tested negative for COVID on 4/14 LLE [place] negative for DVT  Copd on home o2 (2 L [place])  Acute Hypercarbic Respiratory Failure - Required bipap on admission for hypercarbia with acidemia She's not wheezing on exam and is comfortable at this time Solumedrol -> home hydrocortisone 4/26 Will d/c azithromycin given lack of wheezing and mildly prolonged QTC Cont Stiolto Cont Albuterol Wean O2 for goal 88-92%   Abnormal EKG  Mild Troponin Elevation  Atypical Chest Pain:  T wave inversions in I, aVL.  Appears different than priors.   Will repeat EKG Follow echo (as above) Troponin low and flat Cards c/s given c/o CP with echo with decreased  EF and wma.  Per cards, no further ischemic eval.  Per cards, low suspicion for change in LV function as prior echos had EF in 40% range. Will need outpatient cards follow up  CAD Cont Plavix Cont Lipitor  H/o pulmonary nodule 32m in left lower lung on CT  07/11/2018 Needs repeat CT scan in 6 -12 months  Dm2 Resume home meds  Hypothyroidism, recent TSH wnl Cont Levothyroxine 150 micrograms po qday  Insomnia Cont Belsomra  Anxiety/ Arthritis Cont Cymbalta Add hydroxyzine for anxiety this AM  Adrenal insufficiency HOLDHydrocortisone 124mpo qam and 53m23mo qpm => use solumedrol 91m553m q8h -> wean -> home hydrocortisone 4/26  Tobacco use counselled on smoking cessation x 3 minutes Nicotine patch prn  Leukocytosis: 2/2 steroids, follow  Elevated Liver Enzymes: continue to monitor. Follow acute hepatitis panel (negative).  Urinary Incontinence: follow up outpatient, follow UA ([place])   Procedures: Echo IMPRESSIONS    1. Severe hypokinesis of the left ventricular, basal-mid inferior wall and inferolateral wall.  2. The left ventricle has mild-moderately reduced systolic function, with an ejection fraction of 40-45%. Left ventricular diastolic Doppler parameters are consistent with impaired relaxation. Left ventricular diffuse hypokinesis.  3. Left atrial size was not assessed.  4. The aortic valve is tricuspid.  5. The aortic arch is normal in size and structure.  6. Shunt cannot be excluded on the basis of these images.  Consultations:  cards  Discharge Exam: Vitals:   07/22/18 0400 07/22/18 0747  BP: 113/74 (!) 119/92  Pulse: 72 95  Resp: 14 (!) 38  Temp:  98.2 F (36.8 C)  SpO2: 99% 99%   Feels well Wants to go home  General: No acute distress. Cardiovascular: Heart sounds show a regular rate, and rhythm.  Lungs: Clear to auscultation bilaterally  Abdomen: Soft, nontender, nondistended  Neurological: Alert and oriented 3. Moves all extremities 4. Cranial nerves II through XII grossly intact. Skin: Warm and dry. No rashes or lesions. Extremities: No clubbing or cyanosis. L>R LEE Psychiatric: Mood and affect are normal. Insight and judgment are stable.  Discharge Instructions   Discharge  Instructions    (HEART FAILURE PATIENTS) Call MD:  Anytime you have any of the following symptoms: 1) 3 pound weight gain in 24 hours or 5 pounds in 1 week 2) shortness of breath, with or without a dry hacking cough 3) swelling in the hands, feet or stomach 4) if you have to sleep on extra pillows at night in order to breathe.   Complete by:  As directed    Call MD for:  difficulty breathing, headache or visual disturbances   Complete by:  As directed    Call MD for:  extreme fatigue   Complete by:  As directed    Call MD for:  hives   Complete by:  As directed    Call MD for:  persistant dizziness or light-headedness   Complete by:  As directed    Call MD for:  persistant nausea and vomiting   Complete by:  As directed    Call MD for:  redness, tenderness, or signs of infection (pain, swelling, redness, odor or green/yellow discharge around incision site)   Complete by:  As directed    Call MD for:  severe uncontrolled pain   Complete by:  As directed    Call MD for:  temperature >100.4   Complete by:  As directed    Diet - low sodium heart healthy   Complete  by:  As directed    Discharge instructions   Complete by:  As directed    You were seen for a heart failure exacerbation.  You've improved with lasix.  We are planning to discharge you on 80 mg of torsemide daily.  Check your weights daily.  Please call your PCP or cardiology if your weight increases more than 2-3 lbs in 1 day or more than 5 lbs in 1 week.  You should have repeat labs in 1-2 weeks.  You should discuss another blood pressure medication with your PCP as an outpatient (losartan or a medication like losartan), which can be helpful in the setting of heart failure.  Your cardiologist or PCP can start this depending on your blood pressure and kidney function in the future.  There was a lung nodule on your CT scan from a few weeks ago.  Please follow up with your PCP.  You'll need repeat CT imaging in 6 months.  Return  for new, recurrent, or worsening symptoms.  Please ask your PCP to request records from this hospitalization so they know what was done and what the next steps will be.   Heart Failure patients record your daily weight using the same scale at the same time of day   Complete by:  As directed    Increase activity slowly   Complete by:  As directed    STOP any activity that causes chest pain, shortness of breath, dizziness, sweating, or exessive weakness   Complete by:  As directed      Allergies as of 07/22/2018      Reactions   Hydroxychloroquine Shortness Of Breath, Nausea Only, Other (See Comments)   Dizziness   Donepezil Other (See Comments)   Dizziness, depression, and makes the patient feel "funny"   Prednisone Other (See Comments)   Causes depression and suicidal thoughts   Anticoagulant Cit Dext [acd Formula A] Other (See Comments)   Unknown   Bupropion Other (See Comments)   Suicidal thoughts   Metrizamide Other (See Comments)   (a non-ionic radiopaque contrast agent) "Blows the vein" and contrast gathers at the injected site's limb   Varenicline Other (See Comments)   Suicidal thoughts   Tape Rash, Other (See Comments)   Paper tape is preferred, PLEASE      Medication List    STOP taking these medications   azithromycin 250 MG tablet Commonly known as:  ZITHROMAX   furosemide 20 MG tablet Commonly known as:  LASIX     TAKE these medications   acetaminophen 500 MG tablet Commonly known as:  TYLENOL Take 1 tablet (500 mg total) by mouth every 6 (six) hours as needed for mild pain.   albuterol 108 (90 Base) MCG/ACT inhaler Commonly known as:  ProAir HFA Inhale 2 puffs into the lungs every 4-6 hours as needed for shortness of breath or wheezing   albuterol (2.5 MG/3ML) 0.083% nebulizer solution Commonly known as:  PROVENTIL Take 3 mLs (2.5 mg total) by nebulization every 6 (six) hours as needed for wheezing or shortness of breath.   atorvastatin 20 MG  tablet Commonly known as:  LIPITOR Take 20 mg by mouth every evening.   Belsomra 20 MG Tabs Generic drug:  Suvorexant Take 20 mg by mouth at bedtime.   benzonatate 100 MG capsule Commonly known as:  TESSALON Take 1 capsule (100 mg total) by mouth every 8 (eight) hours.   CALCIUM PO Take 1 tablet by mouth daily.   clopidogrel 75  MG tablet Commonly known as:  PLAVIX Take 1 tablet (75 mg total) by mouth daily. What changed:  when to take this   DULoxetine 60 MG capsule Commonly known as:  CYMBALTA Take 60 mg by mouth daily.   Emgality 120 MG/ML Sosy Generic drug:  Galcanezumab-gnlm Inject 120 mg into the skin every 30 (thirty) days.   Fusion Plus Caps Take 1 capsule by mouth at bedtime.   hydrocortisone 2.5 % rectal cream Commonly known as:  ANUSOL-HC Place rectally 2 (two) times daily. What changed:    how much to take  when to take this  reasons to take this   hydrocortisone 5 MG tablet Commonly known as:  CORTEF Take 5-15 mg by mouth See admin instructions. Take 15 mg in the morning and 5 mg in the evening   levothyroxine 150 MCG tablet Commonly known as:  SYNTHROID Take 150 mcg by mouth daily before breakfast.   MAGnesium-Oxide 400 (241.3 Mg) MG tablet Generic drug:  magnesium oxide Take 400 mg by mouth daily.   methylPREDNISolone 8 MG tablet Commonly known as:  Medrol Take 1 tablet (8 mg total) by mouth daily.   nitroGLYCERIN 0.4 MG SL tablet Commonly known as:  NITROSTAT Place 1 tablet (0.4 mg total) under the tongue every 5 (five) minutes as needed for chest pain.   OXYGEN Place 2 L into the nose continuous.   polyethylene glycol 17 g packet Commonly known as:  MIRALAX / GLYCOLAX Take 17 g by mouth daily. What changed:    when to take this  reasons to take this   potassium chloride SA 20 MEQ tablet Commonly known as:  K-DUR Take 1 tablet (20 mEq total) by mouth daily. Take when you take Torsemide.   Tiotropium Bromide-Olodaterol  2.5-2.5 MCG/ACT Aers Commonly known as:  Stiolto Respimat Inhale 2 puffs into the lungs daily.   torsemide 20 MG tablet Commonly known as:  DEMADEX Take 4 tablets (80 mg total) by mouth daily for 30 days.   Tyler Aas FlexTouch 100 UNIT/ML Sopn FlexTouch Pen Generic drug:  insulin degludec Inject 42 Units into the skin at bedtime.   Trulicity 1.5 IW/9.7LG Sopn Generic drug:  Dulaglutide Inject 1.5 mg into the skin every Sunday.   Vitamin D3 50 MCG (2000 UT) capsule Take 2,000 Units by mouth daily.      Allergies  Allergen Reactions  . Hydroxychloroquine Shortness Of Breath, Nausea Only and Other (See Comments)    Dizziness  . Donepezil Other (See Comments)    Dizziness, depression, and makes the patient feel "funny"  . Prednisone Other (See Comments)    Causes depression and suicidal thoughts  . Anticoagulant Cit Dext [Acd Formula A] Other (See Comments)    Unknown  . Bupropion Other (See Comments)    Suicidal thoughts  . Metrizamide Other (See Comments)    (a non-ionic radiopaque contrast agent) "Blows the vein" and contrast gathers at the injected site's limb  . Varenicline Other (See Comments)    Suicidal thoughts  . Tape Rash and Other (See Comments)    Paper tape is preferred, PLEASE      The results of significant diagnostics from this hospitalization (including imaging, microbiology, ancillary and laboratory) are listed below for reference.    Significant Diagnostic Studies: Ct Abdomen Pelvis Wo Contrast  Result Date: 07/11/2018 CLINICAL DATA:  Abdominal pain EXAM: CT ABDOMEN AND PELVIS WITHOUT CONTRAST TECHNIQUE: Multidetector CT imaging of the abdomen and pelvis was performed following the standard protocol without IV  contrast. COMPARISON:  CT abdomen pelvis 05/13/2018 FINDINGS: LOWER CHEST: 6 mm nodular opacity at the left lung base. Bibasilar atelectasis. HEPATOBILIARY: The hepatic contours and density are normal. There is no intra- or extrahepatic biliary  dilatation. Status post cholecystectomy. PANCREAS: The pancreatic parenchymal contours are normal and there is no ductal dilatation. There is no peripancreatic fluid collection. SPLEEN: Normal. ADRENALS/URINARY TRACT: --Adrenal glands: Normal. --Right kidney/ureter: No hydronephrosis, nephroureterolithiasis, perinephric stranding or solid renal mass. --Left kidney/ureter: Single nonobstructing renal calculus measuring 4 mm. No hydronephrosis, perinephric stranding or solid renal mass. --Urinary bladder: Normal for degree of distention STOMACH/BOWEL: --Stomach/Duodenum: There is no hiatal hernia or other gastric abnormality. The duodenal course and caliber are normal. --Small bowel: There is a ventral abdominal hernia that contains a small loop of unobstructed small bowel. --Colon: No focal abnormality. --Appendix: Not visualized. No right lower quadrant inflammation or free fluid. VASCULAR/LYMPHATIC: There is aortic atherosclerosis without hemodynamically significant stenosis. No abdominal or pelvic lymphadenopathy. REPRODUCTIVE: Normal uterus and ovaries. MUSCULOSKELETAL. No bony spinal canal stenosis or focal osseous abnormality. OTHER: No radiopaque hernia repair mesh is visualized. IMPRESSION: 1. No acute abnormality of the abdomen or pelvis. 2. Nonobstructive left nephrolithiasis measuring 4 mm. 3. Ventral abdominal hernia containing a small loop of unobstructed small bowel, unchanged. No radiopaque hernia repair mesh. 4. 6 mm nodular opacity at the left lung base, not visible previously. Non-contrast chest CT at 6-12 months is recommended. If the nodule is stable at time of repeat CT, then future CT at 18-24 months (from today's scan) is considered optional for low-risk patients, but is recommended for high-risk patients. This recommendation follows the consensus statement: Guidelines for Management of Incidental Pulmonary Nodules Detected on CT Images: From the Fleischner Society 2017; Radiology 2017;  284:228-243. Electronically Signed   By: Ulyses Jarred M.D.   On: 07/11/2018 16:37   Dg Chest 2 View  Result Date: 07/10/2018 CLINICAL DATA:  Increasing shortness of Breath EXAM: CHEST - 2 VIEW COMPARISON:  06/03/2018 FINDINGS: Cardiac shadow is stable. Aortic calcifications are noted and stable. Lungs are well aerated bilaterally. Mild scarring is again seen in left base. No focal infiltrate or effusion is noted. No bony abnormality is noted. Postsurgical changes in the cervical spine are noted. IMPRESSION: Mild left basilar scarring is noted. Electronically Signed   By: Inez Catalina M.D.   On: 07/10/2018 01:03   Dg Chest Portable 1 View  Result Date: 07/19/2018 CLINICAL DATA:  63 year old female with shortness of breath EXAM: PORTABLE CHEST 1 VIEW COMPARISON:  07/10/2018 FINDINGS: Cardiomediastinal silhouette unchanged in size and contour. Calcifications of the aortic arch. No pneumothorax. No pleural effusion. No confluent airspace disease. Changes of prior PTC I. Surgical changes of the cervical region. No displaced fracture IMPRESSION: Negative for acute cardiopulmonary disease Electronically Signed   By: Corrie Mckusick D.O.   On: 07/19/2018 20:27   Vas [place] Lower Extremity Venous (dvt)  Result Date: 07/20/2018  Lower Venous Study Indications: Edema.  Performing Technologist: Abram Sander RVS  Examination Guidelines: A complete evaluation includes B-mode imaging, spectral Doppler, color Doppler, and power Doppler as needed of all accessible portions of each vessel. Bilateral testing is considered an integral part of a complete examination. Limited examinations for reoccurring indications may be performed as noted.  +-----+---------------+---------+-----------+----------+-------+ RIGHTCompressibilityPhasicitySpontaneityPropertiesSummary +-----+---------------+---------+-----------+----------+-------+ CFV  Full           Yes      Yes                           +-----+---------------+---------+-----------+----------+-------+   +---------+---------------+---------+-----------+----------+--------------+  LEFT     CompressibilityPhasicitySpontaneityPropertiesSummary        +---------+---------------+---------+-----------+----------+--------------+ CFV      Full           Yes      Yes                                 +---------+---------------+---------+-----------+----------+--------------+ SFJ      Full                                                        +---------+---------------+---------+-----------+----------+--------------+ FV Prox  Full                                                        +---------+---------------+---------+-----------+----------+--------------+ FV Mid   Full                                                        +---------+---------------+---------+-----------+----------+--------------+ FV DistalFull                                                        +---------+---------------+---------+-----------+----------+--------------+ PFV      Full                                                        +---------+---------------+---------+-----------+----------+--------------+ POP      Full           Yes      Yes                                 +---------+---------------+---------+-----------+----------+--------------+ PTV      Full                                                        +---------+---------------+---------+-----------+----------+--------------+ PERO                                                  Not visualized +---------+---------------+---------+-----------+----------+--------------+     Summary: Right: No evidence of common femoral vein obstruction. Left: There is no evidence of deep vein thrombosis in the lower extremity. However, portions of this examination were limited- see technologist comments above. No cystic structure found in the popliteal fossa.  *See  table(s) above for  measurements and observations. Electronically signed by Monica Martinez MD on 07/20/2018 at 5:01:05 PM.    Final     Microbiology: No results found for this or any previous visit (from the past 240 hour(s)).   Labs: Basic Metabolic Panel: Recent Labs  Lab 07/19/18 2058 07/20/18 0153 07/21/18 1006 07/22/18 0237  NA 141 140 137 141  K 4.2 4.7 4.6 4.5  CL 102 102 97* 99  CO2 _0 33*  GLUCOSE 179* 303* 177* 108*  BUN 16 20 34* 34*  CREATININE 1.28* 1.37* 1.28* 1.23*  CALCIUM 8.9 8.9 8.9 9.4  MG  --   --   --  2.0   Liver Function Tests: Recent Labs  Lab 07/19/18 2058 07/20/18 0153 07/22/18 0237  AST 25 125* 22  ALT 31 86* 42  ALKPHOS 85 173* 110  BILITOT 0.4 0.4 0.4  PROT 5.8* 6.4* 6.3*  ALBUMIN 2.7* 2.8* 2.6*   No results for input(s): LIPASE, AMYLASE in the last 168 hours. No results for input(s): AMMONIA in the last 168 hours. CBC: Recent Labs  Lab 07/19/18 2058 07/20/18 0153 07/21/18 1006 07/22/18 0237  WBC 11.0* 27.2* 18.7* 14.7*  NEUTROABS 8.3*  --   --   --   HGB 11.6* 12.5 12.3 11.9*  HCT 36.4 39.3 38.1 36.8  MCV 94.3 93.1 92.9 93.2  PLT 228 264 245 230   Cardiac Enzymes: Recent Labs  Lab 07/20/18 0153 07/20/18 1220 07/20/18 1644 07/20/18 2308 07/21/18 0537  TROPONINI 0.04* 0.05* 0.04* 0.04* 0.04*   BNP: BNP (last 3 results) Recent Labs    06/03/18 1159 07/10/18 0206 07/19/18 2058  BNP 331.0* 202.8* 671.6*    ProBNP (last 3 results) Recent Labs    01/31/18 1230 03/15/18 1025  PROBNP 1,553* 858*    CBG: Recent Labs  Lab 07/21/18 0545 07/21/18 1107 07/21/18 1617 07/21/18 2051 07/22/18 0620  GLUCAP 194* 157* 190* 202* 149*       Signed:  Fayrene Helper MD.  [facility] 07/22/2018, 6:38 PM

## 2018-07-23 DIAGNOSIS — J9612 Chronic respiratory failure with hypercapnia: Secondary | ICD-10-CM | POA: Diagnosis not present

## 2018-07-23 DIAGNOSIS — J9611 Chronic respiratory failure with hypoxia: Secondary | ICD-10-CM | POA: Diagnosis not present

## 2018-07-23 DIAGNOSIS — I5022 Chronic systolic (congestive) heart failure: Secondary | ICD-10-CM | POA: Diagnosis not present

## 2018-07-23 DIAGNOSIS — F331 Major depressive disorder, recurrent, moderate: Secondary | ICD-10-CM | POA: Diagnosis not present

## 2018-07-26 DIAGNOSIS — J449 Chronic obstructive pulmonary disease, unspecified: Secondary | ICD-10-CM | POA: Diagnosis not present

## 2018-08-02 DIAGNOSIS — K439 Ventral hernia without obstruction or gangrene: Secondary | ICD-10-CM | POA: Diagnosis not present

## 2018-08-02 DIAGNOSIS — R1011 Right upper quadrant pain: Secondary | ICD-10-CM | POA: Diagnosis not present

## 2018-08-02 DIAGNOSIS — N281 Cyst of kidney, acquired: Secondary | ICD-10-CM | POA: Diagnosis not present

## 2018-08-03 ENCOUNTER — Encounter: Payer: Self-pay | Admitting: Cardiology

## 2018-08-03 ENCOUNTER — Telehealth (INDEPENDENT_AMBULATORY_CARE_PROVIDER_SITE_OTHER): Payer: Federal, State, Local not specified - PPO | Admitting: Cardiology

## 2018-08-03 ENCOUNTER — Other Ambulatory Visit: Payer: Self-pay

## 2018-08-03 VITALS — BP 126/79 | HR 112 | Wt 222.0 lb

## 2018-08-03 DIAGNOSIS — I5042 Chronic combined systolic (congestive) and diastolic (congestive) heart failure: Secondary | ICD-10-CM

## 2018-08-03 DIAGNOSIS — I255 Ischemic cardiomyopathy: Secondary | ICD-10-CM

## 2018-08-03 DIAGNOSIS — J121 Respiratory syncytial virus pneumonia: Secondary | ICD-10-CM | POA: Diagnosis not present

## 2018-08-03 NOTE — Progress Notes (Signed)
Virtual Visit via Video Note   This visit type was conducted due to national recommendations for restrictions regarding the COVID-19 Pandemic (e.g. social distancing) in an effort to limit this patient's exposure and mitigate transmission in our community.  Due to her co-morbid illnesses, this patient is at least at moderate risk for complications without adequate follow up.  This format is felt to be most appropriate for this patient at this time.  All issues noted in this document were discussed and addressed.  A limited physical exam was performed with this format.  Please refer to the patient's chart for her consent to telehealth for [facility].  Evaluation Performed:  Follow-up visit  This visit type was conducted due to national recommendations for restrictions regarding the COVID-19 Pandemic (e.g. social distancing).  This format is felt to be most appropriate for this patient at this time.  All issues noted in this document were discussed and addressed.  No physical exam was performed (except for noted visual exam findings with Video Visits).  Please refer to the patient's chart (MyChart message for video visits and phone note for telephone visits) for the patient's consent to telehealth for [facility].  Date:  08/03/2018  ID: Crystal Yates, DOB 21-Aug-1955, MRN [id number]   Patient Location: [place] [address]   Provider location:   [place] Office  PCP:  Dortha Kern, [place]  Cardiologist:  Crystal Campus, MD     Chief Complaint: I am short of breath  History of Present Illness:    Crystal Yates is a 63 y.o. female  who presents via audio/video conferencing for a telehealth visit today.  Complex past medical history that includes COPD which is advanced in spite of that she still continue to smoke, also coronary artery disease as well as ischemic cardiomyopathy latest estimation of left ventricular ejection fraction  by echocardiogram done just few weeks ago showing 40 to 45% with inferior hypokinesis.  She also have diabetes hypertension dyslipidemia.  She does have history of stenting to LAD as well as stenting to right coronary artery last time in 2018 I have a video visit with her today.  She recently ended going to hospital because of shortness of breath she was treated for both congestive heart failure as well as exacerbation of COPD.  Seems to be recovered somewhat but still not up to the park.  I talked to her over a video link today and cannot clearly say that she is not doing well she does have some shortness of breath she coughs a lot she cannot do much sadly she still continue to smoke.  Denies having any chest pain, tightness, pressure, burning, squeezing in the chest.  Swelling of lower extremities still happening sometimes.   The patient does not have symptoms concerning for COVID-19 infection (fever, chills, cough, or new SHORTNESS OF BREATH).    Prior CV studies:   The following studies were reviewed today:  Echocardiogram done on 20 July 2018 showed:   1. Severe hypokinesis of the left ventricular, basal-mid inferior wall and inferolateral wall.  2. The left ventricle has mild-moderately reduced systolic function, with an ejection fraction of 40-45%. Left ventricular diastolic Doppler parameters are consistent with impaired relaxation. Left ventricular diffuse hypokinesis.  3. Left atrial size was not assessed.  4. The aortic valve is tricuspid.  5. The aortic arch is normal in size and structure.  6. Shunt cannot be excluded on the basis of  these images.  Cardiac catheterization from 13 October 2017:  Normal left main.  Proximal to mid LAD stent with diffuse 30% restenosis.  Proximal circumflex with an eccentric 50 to 65% stenosis depending upon review..  Progression at this site is noted when compared to one year ago when this region contained 30% narrowing.  FFR was not possible due to  COPD and intolerance of adenosine.  Dominant right coronary with proximal to distal stenting and overlap fashion, possibly with some region of stent within stent.  Distally there is an eccentric 70 to 75% stenosis that is clearly progressed when compared to one year ago when there was less than 50% narrowing.  Mild pulmonary hypertension with mean PA pressure of 30 mmHg.  Reduced LV systolic function with regional wall motion abnormality including severe hypokinesis of the inferobasal wall.  Estimated EF 40 to 45%.  LVEDP is 20 mmHg.  Mean pulmonary capillary wedge pressure is 20 mmHg.  Successful balloon angioplasty of distal right coronary ISR reducing from 75% to 0% with TIMI grade III flow using a 3.5 mm [place] balloon x2 inflations.       Past Medical History:  Diagnosis Date  . Acute on chronic systolic heart failure exacerbation(HCC) 04/08/2016  . Arthritis   . CAD in native artery    a. Prior LAD stenting based on cath. b. RCA stenting 03/2016 x2.  . Chronic combined systolic and diastolic CHF (congestive heart failure) ([place])   . CKD (chronic kidney disease), stage II   . COPD (chronic obstructive pulmonary disease) ([place])   . Diabetes mellitus without complication ([place])   . Hashimoto's thyroiditis   . Hyperlipidemia   . Hypertension   . Myocardial infarction ([place])   . On home oxygen therapy    "2L; 24/7" (10/23/2017)  . Secondary adrenal insufficiency ([place])   . Thyroid disease   . Tobacco abuse     Past Surgical History:  Procedure Laterality Date  . ABDOMINAL SURGERY    . CESAREAN SECTION    . CHOLECYSTECTOMY    . COLON RESECTION    . COLONOSCOPY WITH PROPOFOL N/A 01/16/2018   Procedure: COLONOSCOPY WITH PROPOFOL;  Surgeon: Rush Landmark Telford Nab., MD;  Location: [place];  Service: Gastroenterology;  Laterality: N/A;  . CORONARY BALLOON ANGIOPLASTY N/A 10/13/2017   Procedure: CORONARY BALLOON ANGIOPLASTY;  Surgeon: Belva Crome, MD;  Location: [place] [facility];   Service: Cardiovascular;  Laterality: N/A;  . HERNIA MESH REMOVAL    . HERNIA REPAIR    . LEFT HEART CATH AND CORONARY ANGIOGRAPHY N/A 10/13/2016   Procedure: Left Heart Cath and Coronary Angiography;  Surgeon: Nelva Bush, MD;  Location: [place] [facility];  Service: Cardiovascular;  Laterality: N/A;  . POLYPECTOMY  01/16/2018   Procedure: POLYPECTOMY;  Surgeon: Rush Landmark Telford Nab., MD;  Location: [place];  Service: Gastroenterology;;  . RIGHT/LEFT HEART CATH AND CORONARY ANGIOGRAPHY N/A 10/13/2017   Procedure: RIGHT/LEFT HEART CATH AND CORONARY ANGIOGRAPHY;  Surgeon: Belva Crome, MD;  Location: [place] [facility];  Service: Cardiovascular;  Laterality: N/A;  . SHOULDER ARTHROSCOPY    . TUBAL LIGATION       Current Meds  Medication Sig  . acetaminophen (TYLENOL) 500 MG tablet Take 1 tablet (500 mg total) by mouth every 6 (six) hours as needed for mild pain.  Marland Kitchen albuterol (PROAIR HFA) 108 (90 Base) MCG/ACT inhaler Inhale 2 puffs into the lungs every 4-6 hours as needed for shortness of breath or wheezing  .  albuterol (PROVENTIL) (2.5 MG/3ML) 0.083% nebulizer solution Take 3 mLs (2.5 mg total) by nebulization every 6 (six) hours as needed for wheezing or shortness of breath.  Marland Kitchen atorvastatin (LIPITOR) 20 MG tablet Take 20 mg by mouth every evening.  Marland Kitchen CALCIUM PO Take 1 tablet by mouth daily.  . Cholecalciferol (VITAMIN D3) 2000 units capsule Take 2,000 Units by mouth daily.   . clopidogrel (PLAVIX) 75 MG tablet Take 1 tablet (75 mg total) by mouth daily. (Patient taking differently: Take 75 mg by mouth at bedtime. )  . DULoxetine (CYMBALTA) 60 MG capsule Take 60 mg by mouth daily.   . Galcanezumab-gnlm (EMGALITY) 120 MG/ML SOSY Inject 120 mg into the skin every 30 (thirty) days.   . hydrocortisone (ANUSOL-HC) 2.5 % rectal cream Place rectally 2 (two) times daily. (Patient taking differently: Place 1 application rectally daily as needed for hemorrhoids or anal itching. )  .  hydrocortisone (CORTEF) 5 MG tablet Take 5-15 mg by mouth See admin instructions. Take 15 mg in the morning and 5 mg in the evening  . hydrOXYzine (ATARAX/VISTARIL) 25 MG tablet Take 1 tablet by mouth 2 (two) times daily.  . insulin degludec (TRESIBA FLEXTOUCH) 100 UNIT/ML SOPN FlexTouch Pen Inject 42 Units into the skin at bedtime.   . Iron-FA-B Cmp-C-Biot-Probiotic (FUSION PLUS) CAPS Take 1 capsule by mouth at bedtime.   Marland Kitchen levothyroxine (SYNTHROID, LEVOTHROID) 150 MCG tablet Take 150 mcg by mouth daily before breakfast.  . MAGNESIUM-OXIDE 400 (241.3 Mg) MG tablet Take 400 mg by mouth daily.  . nitroGLYCERIN (NITROSTAT) 0.4 MG SL tablet Place 1 tablet (0.4 mg total) under the tongue every 5 (five) minutes as needed for chest pain.  . OXYGEN Place 2 L into the nose continuous.   . polyethylene glycol (MIRALAX / GLYCOLAX) packet Take 17 g by mouth daily. (Patient taking differently: Take 17 g by mouth daily as needed for mild constipation. )  . potassium chloride SA (K-DUR,KLOR-CON) 20 MEQ tablet Take 1 tablet (20 mEq total) by mouth daily. Take when you take Torsemide.  . Tiotropium Bromide-Olodaterol (STIOLTO RESPIMAT) 2.5-2.5 MCG/ACT AERS Inhale 2 puffs into the lungs daily.  Marland Kitchen torsemide (DEMADEX) 20 MG tablet Take 4 tablets (80 mg total) by mouth daily for 30 days.  . TRULICITY 1.5 NI/6.2VO SOPN Inject 1.5 mg into the skin every Sunday.       Family History: The patient's family history includes Diabetes in her father, sister, sister, and son; Stroke in her mother.   ROS:   Please see the history of present illness.     All other systems reviewed and are negative.   Labs/Other Tests and Data Reviewed:     Recent Labs: 09/16/2017: TSH 2.218 03/15/2018: NT-Pro BNP 858 07/19/2018: B Natriuretic Peptide 671.6 07/22/2018: ALT 42; BUN 34; Creatinine, Ser 1.23; Hemoglobin 11.9; Magnesium 2.0; Platelets 230; Potassium 4.5; Sodium 141  Recent Lipid Panel    Component Value Date/Time    CHOL 122 10/13/2016 0037   TRIG 68 10/13/2016 0037   HDL 34 (L) 10/13/2016 0037   CHOLHDL 3.6 10/13/2016 0037   VLDL 14 10/13/2016 0037   LDLCALC 74 10/13/2016 0037      Exam:    Vital Signs:  BP 126/79   Pulse (!) 112   Wt 222 lb (100.7 kg)   SpO2 96%   BMI 40.60 kg/m     Wt Readings from Last 3 Encounters:  08/03/18 222 lb (100.7 kg)  07/22/18 222 lb 7.1 oz (100.9 kg)  07/10/18 229 lb 4.5 oz (104 kg)     Well nourished, well developed in no acute distress. She is in mild respiratory distress.  Talking to me over the video link.  She is telling me that she is planning to go back to hospital because she is not feeling well.  Diagnosis for this visit:   1. Ischemic cardiomyopathy   2. Chronic combined systolic and diastolic congestive heart failure ([place])   3. Pneumonia due to respiratory syncytial virus (RSV)      ASSESSMENT & PLAN:    1.  Ischemic cardiomyopathy with ejection fraction 40 to 45%.  This is as previously there is no deterioration of this condition.  She has been put on significant dose of torsemide.  She did not take that medication yesterday because she went for MRI.  That could be the reason for her deterioration today.  I asked her to make sure she takes her torsemide and she does.  She told me that she most likely will go to the emergency room today.  Continue rest of her medications. 2.  COPD with exacerbation.  Sadly she still continue to smoke she is on multiple medications followed by internal medicine team. 3.  Coronary artery disease cardiac catheterization reviewed from the summer of last year.  She may require CAD work-up but overall situation is quite depressing since she still continue to smoke.  In the meantime I will continue all medications.  She told me straight that she is fine to go to the emergency room.  I did review her laboratory test just from yesterday which creatinine was 1.4 and the BUN was 32.  Again that was done before MRI of her  abdomen.  COVID-19 Education: The signs and symptoms of COVID-19 were discussed with the patient and how to seek care for testing (follow up with PCP or arrange E-visit).  The importance of social distancing was discussed today.  Patient Risk:   After full review of this patients clinical status, I feel that they are at least moderate risk at this time.  Time:   Today, I have spent 21 minutes with the patient with telehealth technology discussing pt health issues.  I spent 19minutes reviewing her chart before the visit.  Visit was finished at 10:09 AM.    Medication Adjustments/Labs and Tests Ordered: Current medicines are reviewed at length with the patient today.  Concerns regarding medicines are outlined above.  No orders of the defined types were placed in this encounter.  Medication changes: No orders of the defined types were placed in this encounter.    Disposition: Follow-up with me in 1 month  Signed, Park Liter, MD, [facility] 08/03/2018 10:10 AM    [place]

## 2018-08-03 NOTE — Patient Instructions (Signed)
Medication Instructions:  Your physician recommends that you continue on your current medications as directed. Please refer to the Current Medication list given to you today.  If you need a refill on your cardiac medications before your next appointment, please call your pharmacy.   Lab work: None If you have labs (blood work) drawn today and your tests are completely normal, you will receive your results only by: . MyChart Message (if you have MyChart) OR . A paper copy in the mail If you have any lab test that is abnormal or we need to change your treatment, we will call you to review the results.  Testing/Procedures: None  Follow-Up: At [facility], you and your health needs are our priority.  As part of our continuing mission to provide you with exceptional heart care, we have created designated Provider Care Teams.  These Care Teams include your primary Cardiologist (physician) and Advanced Practice Providers (APPs -  Physician Assistants and Nurse Practitioners) who all work together to provide you with the care you need, when you need it. You will need a follow up appointment in 1 months Any Other Special Instructions Will Be Listed Below (If Applicable).    

## 2018-08-06 ENCOUNTER — Inpatient Hospital Stay (HOSPITAL_COMMUNITY)
Admission: EM | Admit: 2018-08-06 | Discharge: 2018-08-09 | DRG: 190 | Disposition: A | Discharge status: Home Health | Payer: Federal, State, Local not specified - PPO | Attending: Internal Medicine | Admitting: Internal Medicine

## 2018-08-06 ENCOUNTER — Emergency Department (HOSPITAL_COMMUNITY): Payer: Federal, State, Local not specified - PPO

## 2018-08-06 ENCOUNTER — Other Ambulatory Visit: Payer: Self-pay

## 2018-08-06 DIAGNOSIS — I110 Hypertensive heart disease with heart failure: Secondary | ICD-10-CM | POA: Diagnosis not present

## 2018-08-06 DIAGNOSIS — Z833 Family history of diabetes mellitus: Secondary | ICD-10-CM | POA: Diagnosis not present

## 2018-08-06 DIAGNOSIS — Z955 Presence of coronary angioplasty implant and graft: Secondary | ICD-10-CM | POA: Diagnosis not present

## 2018-08-06 DIAGNOSIS — R0602 Shortness of breath: Secondary | ICD-10-CM

## 2018-08-06 DIAGNOSIS — Z7902 Long term (current) use of antithrombotics/antiplatelets: Secondary | ICD-10-CM | POA: Diagnosis not present

## 2018-08-06 DIAGNOSIS — G8929 Other chronic pain: Secondary | ICD-10-CM | POA: Diagnosis present

## 2018-08-06 DIAGNOSIS — I5041 Acute combined systolic (congestive) and diastolic (congestive) heart failure: Secondary | ICD-10-CM

## 2018-08-06 DIAGNOSIS — E1122 Type 2 diabetes mellitus with diabetic chronic kidney disease: Secondary | ICD-10-CM | POA: Diagnosis present

## 2018-08-06 DIAGNOSIS — Z79899 Other long term (current) drug therapy: Secondary | ICD-10-CM

## 2018-08-06 DIAGNOSIS — K439 Ventral hernia without obstruction or gangrene: Secondary | ICD-10-CM | POA: Diagnosis present

## 2018-08-06 DIAGNOSIS — N179 Acute kidney failure, unspecified: Secondary | ICD-10-CM | POA: Diagnosis not present

## 2018-08-06 DIAGNOSIS — J9621 Acute and chronic respiratory failure with hypoxia: Secondary | ICD-10-CM | POA: Diagnosis present

## 2018-08-06 DIAGNOSIS — J9601 Acute respiratory failure with hypoxia: Secondary | ICD-10-CM | POA: Diagnosis present

## 2018-08-06 DIAGNOSIS — E1165 Type 2 diabetes mellitus with hyperglycemia: Secondary | ICD-10-CM | POA: Diagnosis present

## 2018-08-06 DIAGNOSIS — Z79890 Hormone replacement therapy (postmenopausal): Secondary | ICD-10-CM

## 2018-08-06 DIAGNOSIS — I5042 Chronic combined systolic (congestive) and diastolic (congestive) heart failure: Secondary | ICD-10-CM | POA: Diagnosis present

## 2018-08-06 DIAGNOSIS — J441 Chronic obstructive pulmonary disease with (acute) exacerbation: Principal | ICD-10-CM | POA: Diagnosis present

## 2018-08-06 DIAGNOSIS — R079 Chest pain, unspecified: Secondary | ICD-10-CM | POA: Diagnosis not present

## 2018-08-06 DIAGNOSIS — I130 Hypertensive heart and chronic kidney disease with heart failure and stage 1 through stage 4 chronic kidney disease, or unspecified chronic kidney disease: Secondary | ICD-10-CM | POA: Diagnosis not present

## 2018-08-06 DIAGNOSIS — Z1159 Encounter for screening for other viral diseases: Secondary | ICD-10-CM | POA: Diagnosis not present

## 2018-08-06 DIAGNOSIS — I2510 Atherosclerotic heart disease of native coronary artery without angina pectoris: Secondary | ICD-10-CM | POA: Diagnosis not present

## 2018-08-06 DIAGNOSIS — N183 Chronic kidney disease, stage 3 (moderate): Secondary | ICD-10-CM | POA: Diagnosis present

## 2018-08-06 DIAGNOSIS — N393 Stress incontinence (female) (male): Secondary | ICD-10-CM | POA: Diagnosis present

## 2018-08-06 DIAGNOSIS — I25119 Atherosclerotic heart disease of native coronary artery with unspecified angina pectoris: Secondary | ICD-10-CM

## 2018-08-06 DIAGNOSIS — D72829 Elevated white blood cell count, unspecified: Secondary | ICD-10-CM | POA: Diagnosis present

## 2018-08-06 DIAGNOSIS — Z6841 Body Mass Index (BMI) 40.0 and over, adult: Secondary | ICD-10-CM | POA: Diagnosis not present

## 2018-08-06 DIAGNOSIS — F17210 Nicotine dependence, cigarettes, uncomplicated: Secondary | ICD-10-CM | POA: Diagnosis not present

## 2018-08-06 DIAGNOSIS — Z794 Long term (current) use of insulin: Secondary | ICD-10-CM

## 2018-08-06 DIAGNOSIS — I252 Old myocardial infarction: Secondary | ICD-10-CM

## 2018-08-06 DIAGNOSIS — T380X5A Adverse effect of glucocorticoids and synthetic analogues, initial encounter: Secondary | ICD-10-CM | POA: Diagnosis present

## 2018-08-06 DIAGNOSIS — Z9981 Dependence on supplemental oxygen: Secondary | ICD-10-CM

## 2018-08-06 DIAGNOSIS — I5043 Acute on chronic combined systolic (congestive) and diastolic (congestive) heart failure: Secondary | ICD-10-CM | POA: Diagnosis not present

## 2018-08-06 DIAGNOSIS — E785 Hyperlipidemia, unspecified: Secondary | ICD-10-CM | POA: Diagnosis present

## 2018-08-06 DIAGNOSIS — E119 Type 2 diabetes mellitus without complications: Secondary | ICD-10-CM

## 2018-08-06 DIAGNOSIS — E063 Autoimmune thyroiditis: Secondary | ICD-10-CM | POA: Diagnosis present

## 2018-08-06 DIAGNOSIS — Z9049 Acquired absence of other specified parts of digestive tract: Secondary | ICD-10-CM

## 2018-08-06 DIAGNOSIS — J9622 Acute and chronic respiratory failure with hypercapnia: Secondary | ICD-10-CM | POA: Diagnosis present

## 2018-08-06 DIAGNOSIS — I491 Atrial premature depolarization: Secondary | ICD-10-CM | POA: Diagnosis not present

## 2018-08-06 DIAGNOSIS — R069 Unspecified abnormalities of breathing: Secondary | ICD-10-CM | POA: Diagnosis not present

## 2018-08-06 DIAGNOSIS — R911 Solitary pulmonary nodule: Secondary | ICD-10-CM | POA: Diagnosis present

## 2018-08-06 DIAGNOSIS — E2740 Unspecified adrenocortical insufficiency: Secondary | ICD-10-CM | POA: Diagnosis not present

## 2018-08-06 LAB — BRAIN NATRIURETIC PEPTIDE: B Natriuretic Peptide: 160.3 pg/mL — ABNORMAL HIGH (ref 0.0–100.0)

## 2018-08-06 LAB — POCT I-STAT 7, (LYTES, BLD GAS, ICA,H+H)
Acid-Base Excess: 9 mmol/L — ABNORMAL HIGH (ref 0.0–2.0)
Bicarbonate: 34.5 mmol/L — ABNORMAL HIGH (ref 20.0–28.0)
Calcium, Ion: 1.18 mmol/L (ref 1.15–1.40)
HCT: 37 % (ref 36.0–46.0)
Hemoglobin: 12.6 g/dL (ref 12.0–15.0)
O2 Saturation: 92 %
Patient temperature: 98.2
Potassium: 3.1 mmol/L — ABNORMAL LOW (ref 3.5–5.1)
Sodium: 142 mmol/L (ref 135–145)
TCO2: 36 mmol/L — ABNORMAL HIGH (ref 22–32)
pCO2 arterial: 47.7 mmHg (ref 32.0–48.0)
pH, Arterial: 7.466 — ABNORMAL HIGH (ref 7.350–7.450)
pO2, Arterial: 60 mmHg — ABNORMAL LOW (ref 83.0–108.0)

## 2018-08-06 LAB — CBC WITH DIFFERENTIAL/PLATELET
Abs Immature Granulocytes: 0.06 10*3/uL (ref 0.00–0.07)
Basophils Absolute: 0.1 10*3/uL (ref 0.0–0.1)
Basophils Relative: 1 %
Eosinophils Absolute: 0.2 10*3/uL (ref 0.0–0.5)
Eosinophils Relative: 2 %
HCT: 41.4 % (ref 36.0–46.0)
Hemoglobin: 13.3 g/dL (ref 12.0–15.0)
Immature Granulocytes: 1 %
Lymphocytes Relative: 21 %
Lymphs Abs: 2.2 10*3/uL (ref 0.7–4.0)
MCH: 30 pg (ref 26.0–34.0)
MCHC: 32.1 g/dL (ref 30.0–36.0)
MCV: 93.2 fL (ref 80.0–100.0)
Monocytes Absolute: 0.7 10*3/uL (ref 0.1–1.0)
Monocytes Relative: 6 %
Neutro Abs: 7.6 10*3/uL (ref 1.7–7.7)
Neutrophils Relative %: 69 %
Platelets: 197 10*3/uL (ref 150–400)
RBC: 4.44 MIL/uL (ref 3.87–5.11)
RDW: 14.4 % (ref 11.5–15.5)
WBC: 10.8 10*3/uL — ABNORMAL HIGH (ref 4.0–10.5)
nRBC: 0 % (ref 0.0–0.2)

## 2018-08-06 LAB — URINALYSIS, ROUTINE W REFLEX MICROSCOPIC
Bilirubin Urine: NEGATIVE
Glucose, UA: NEGATIVE mg/dL
Ketones, ur: NEGATIVE mg/dL
Nitrite: POSITIVE — AB
Protein, ur: NEGATIVE mg/dL
Specific Gravity, Urine: 1.009 (ref 1.005–1.030)
pH: 7 (ref 5.0–8.0)

## 2018-08-06 LAB — BASIC METABOLIC PANEL
Anion gap: 11 (ref 5–15)
BUN: 26 mg/dL — ABNORMAL HIGH (ref 8–23)
CO2: 32 mmol/L (ref 22–32)
Calcium: 9.4 mg/dL (ref 8.9–10.3)
Chloride: 98 mmol/L (ref 98–111)
Creatinine, Ser: 1.26 mg/dL — ABNORMAL HIGH (ref 0.44–1.00)
GFR calc Af Amer: 53 mL/min — ABNORMAL LOW (ref >60)
GFR calc non Af Amer: 45 mL/min — ABNORMAL LOW (ref >60)
Glucose, Bld: 162 mg/dL — ABNORMAL HIGH (ref 70–99)
Potassium: 3.7 mmol/L (ref 3.5–5.1)
Sodium: 141 mmol/L (ref 135–145)

## 2018-08-06 LAB — TROPONIN I
Troponin I: 0.04 ng/mL (ref <0.03)
Troponin I: 0.04 ng/mL (ref <0.03)
Troponin I: 0.03 ng/mL (ref <0.03)
Troponin I: 0.03 ng/mL (ref <0.03)

## 2018-08-06 LAB — MAGNESIUM: Magnesium: 1.8 mg/dL (ref 1.7–2.4)

## 2018-08-06 LAB — SARS CORONAVIRUS 2 BY RT PCR (HOSPITAL ORDER, PERFORMED IN ~~LOC~~ HOSPITAL LAB): SARS Coronavirus 2: NEGATIVE

## 2018-08-06 LAB — GLUCOSE, CAPILLARY
Glucose-Capillary: 236 mg/dL — ABNORMAL HIGH (ref 70–99)
Glucose-Capillary: 133 mg/dL — ABNORMAL HIGH (ref 70–99)
Glucose-Capillary: 156 mg/dL — ABNORMAL HIGH (ref 70–99)

## 2018-08-06 LAB — TSH: TSH: 1.316 u[IU]/mL (ref 0.350–4.500)

## 2018-08-06 MED ORDER — METHYLPREDNISOLONE SODIUM SUCC 125 MG IJ SOLR
60.0000 mg | Freq: Three times a day (TID) | INTRAMUSCULAR | Status: DC
  Administered 2018-08-06 – 2018-08-08 (×5): 60 mg via INTRAVENOUS
  Filled 2018-08-06 (×5): qty 2

## 2018-08-06 MED ORDER — ALBUTEROL SULFATE (2.5 MG/3ML) 0.083% IN NEBU: 2.5000 mg | INHALATION_SOLUTION | RESPIRATORY_TRACT | Status: DC | PRN

## 2018-08-06 MED ORDER — AEROCHAMBER PLUS FLO-VU LARGE MISC
1.0000 | Freq: Once | Status: AC
  Administered 2018-08-06: 04:00:00 1

## 2018-08-06 MED ORDER — FUROSEMIDE 10 MG/ML IJ SOLN: 60.0000 mg | Freq: Two times a day (BID) | INTRAMUSCULAR | Status: DC

## 2018-08-06 MED ORDER — IPRATROPIUM-ALBUTEROL 0.5-2.5 (3) MG/3ML IN SOLN
3.0000 mL | Freq: Four times a day (QID) | RESPIRATORY_TRACT | Status: DC
  Administered 2018-08-06 – 2018-08-08 (×8): 3 mL via RESPIRATORY_TRACT
  Filled 2018-08-06 (×7): qty 3

## 2018-08-06 MED ORDER — FUROSEMIDE 10 MG/ML IJ SOLN
60.0000 mg | Freq: Once | INTRAMUSCULAR | Status: AC
  Administered 2018-08-06: 06:00:00 60 mg via INTRAVENOUS
  Filled 2018-08-06: qty 6

## 2018-08-06 MED ORDER — FE FUMARATE-B12-VIT C-FA-IFC PO CAPS
1.0000 | ORAL_CAPSULE | Freq: Every day | ORAL | Status: DC
  Administered 2018-08-06 – 2018-08-08 (×3): 1 via ORAL
  Filled 2018-08-06 (×4): qty 1

## 2018-08-06 MED ORDER — INSULIN GLARGINE 100 UNIT/ML ~~LOC~~ SOLN
42.0000 [IU] | Freq: Every day | SUBCUTANEOUS | Status: DC
  Filled 2018-08-06: qty 0.42

## 2018-08-06 MED ORDER — GUAIFENESIN ER 600 MG PO TB12
600.0000 mg | ORAL_TABLET | Freq: Two times a day (BID) | ORAL | Status: DC
  Administered 2018-08-06 – 2018-08-09 (×7): 600 mg via ORAL
  Filled 2018-08-06 (×7): qty 1

## 2018-08-06 MED ORDER — ATORVASTATIN CALCIUM 10 MG PO TABS
20.0000 mg | ORAL_TABLET | Freq: Every evening | ORAL | Status: DC
  Administered 2018-08-06 – 2018-08-07 (×2): 20 mg via ORAL
  Filled 2018-08-06 (×3): qty 2

## 2018-08-06 MED ORDER — METHYLPREDNISOLONE SODIUM SUCC 125 MG IJ SOLR
125.0000 mg | Freq: Once | INTRAMUSCULAR | Status: AC
  Administered 2018-08-06: 06:00:00 125 mg via INTRAVENOUS
  Filled 2018-08-06: qty 2

## 2018-08-06 MED ORDER — ALBUTEROL SULFATE HFA 108 (90 BASE) MCG/ACT IN AERS
2.0000 | INHALATION_SPRAY | RESPIRATORY_TRACT | Status: DC | PRN
  Filled 2018-08-06: qty 6.7

## 2018-08-06 MED ORDER — SODIUM CHLORIDE 0.9% FLUSH
3.0000 mL | Freq: Two times a day (BID) | INTRAVENOUS | Status: DC
  Administered 2018-08-06 – 2018-08-09 (×6): 3 mL via INTRAVENOUS

## 2018-08-06 MED ORDER — CLOPIDOGREL BISULFATE 75 MG PO TABS
75.0000 mg | ORAL_TABLET | Freq: Every day | ORAL | Status: DC
  Administered 2018-08-06 – 2018-08-08 (×3): 75 mg via ORAL
  Filled 2018-08-06 (×3): qty 1

## 2018-08-06 MED ORDER — ALBUTEROL SULFATE HFA 108 (90 BASE) MCG/ACT IN AERS
8.0000 | INHALATION_SPRAY | Freq: Once | RESPIRATORY_TRACT | Status: AC
  Administered 2018-08-06: 04:00:00 8 via RESPIRATORY_TRACT
  Filled 2018-08-06: qty 6.7

## 2018-08-06 MED ORDER — SODIUM CHLORIDE 0.9% FLUSH: 3.0000 mL | INTRAVENOUS | Status: DC | PRN

## 2018-08-06 MED ORDER — FENTANYL CITRATE (PF) 100 MCG/2ML IJ SOLN
50.0000 ug | Freq: Once | INTRAMUSCULAR | Status: AC
  Administered 2018-08-06: 50 ug via INTRAVENOUS
  Filled 2018-08-06: qty 2

## 2018-08-06 MED ORDER — POTASSIUM CHLORIDE CRYS ER 20 MEQ PO TBCR
20.0000 meq | EXTENDED_RELEASE_TABLET | Freq: Every day | ORAL | Status: DC
  Administered 2018-08-06 – 2018-08-09 (×4): 20 meq via ORAL
  Filled 2018-08-06 (×4): qty 1

## 2018-08-06 MED ORDER — MAGNESIUM OXIDE 400 (241.3 MG) MG PO TABS
400.0000 mg | ORAL_TABLET | Freq: Every day | ORAL | Status: DC
  Administered 2018-08-06 – 2018-08-07 (×2): 400 mg via ORAL
  Filled 2018-08-06 (×2): qty 1

## 2018-08-06 MED ORDER — HYDROXYZINE HCL 25 MG PO TABS: 25.0000 mg | ORAL_TABLET | Freq: Two times a day (BID) | ORAL | Status: DC | PRN

## 2018-08-06 MED ORDER — AZITHROMYCIN 500 MG PO TABS
500.0000 mg | ORAL_TABLET | Freq: Every day | ORAL | Status: AC
  Administered 2018-08-06: 500 mg via ORAL
  Filled 2018-08-06: qty 1

## 2018-08-06 MED ORDER — HYDROCORTISONE 5 MG PO TABS
15.0000 mg | ORAL_TABLET | Freq: Every day | ORAL | Status: DC
  Filled 2018-08-06: qty 1

## 2018-08-06 MED ORDER — SODIUM CHLORIDE 0.9 % IV SOLN: 250.0000 mL | INTRAVENOUS | Status: DC | PRN

## 2018-08-06 MED ORDER — FUROSEMIDE 10 MG/ML IJ SOLN: 40.0000 mg | Freq: Once | INTRAMUSCULAR | Status: DC

## 2018-08-06 MED ORDER — LEVOTHYROXINE SODIUM 75 MCG PO TABS
150.0000 ug | ORAL_TABLET | Freq: Every day | ORAL | Status: DC
  Administered 2018-08-06 – 2018-08-09 (×4): 150 ug via ORAL
  Filled 2018-08-06 (×3): qty 2

## 2018-08-06 MED ORDER — ENOXAPARIN SODIUM 40 MG/0.4ML ~~LOC~~ SOLN
40.0000 mg | Freq: Every day | SUBCUTANEOUS | Status: DC
  Administered 2018-08-06 – 2018-08-08 (×3): 40 mg via SUBCUTANEOUS
  Filled 2018-08-06 (×4): qty 0.4

## 2018-08-06 MED ORDER — NITROGLYCERIN 0.4 MG SL SUBL
0.4000 mg | SUBLINGUAL_TABLET | SUBLINGUAL | Status: DC | PRN
  Administered 2018-08-08: 0.4 mg via SUBLINGUAL
  Filled 2018-08-06: qty 1

## 2018-08-06 MED ORDER — AZITHROMYCIN 250 MG PO TABS
250.0000 mg | ORAL_TABLET | Freq: Every day | ORAL | Status: DC
  Administered 2018-08-07 – 2018-08-09 (×3): 250 mg via ORAL
  Filled 2018-08-06 (×4): qty 1

## 2018-08-06 MED ORDER — INSULIN GLARGINE 100 UNIT/ML ~~LOC~~ SOLN
32.0000 [IU] | Freq: Every day | SUBCUTANEOUS | Status: DC
  Administered 2018-08-06: 32 [IU] via SUBCUTANEOUS
  Filled 2018-08-06 (×2): qty 0.32

## 2018-08-06 MED ORDER — IPRATROPIUM-ALBUTEROL 0.5-2.5 (3) MG/3ML IN SOLN
3.0000 mL | Freq: Once | RESPIRATORY_TRACT | Status: AC
  Administered 2018-08-06: 06:00:00 3 mL via RESPIRATORY_TRACT
  Filled 2018-08-06: qty 3

## 2018-08-06 MED ORDER — ASPIRIN 81 MG PO CHEW
324.0000 mg | CHEWABLE_TABLET | Freq: Once | ORAL | Status: AC
  Administered 2018-08-06: 324 mg via ORAL
  Filled 2018-08-06: qty 4

## 2018-08-06 MED ORDER — ZOLPIDEM TARTRATE 5 MG PO TABS
5.0000 mg | ORAL_TABLET | Freq: Every evening | ORAL | Status: DC | PRN
  Administered 2018-08-07 – 2018-08-08 (×3): 5 mg via ORAL
  Filled 2018-08-06 (×3): qty 1

## 2018-08-06 MED ORDER — ONDANSETRON HCL 4 MG/2ML IJ SOLN: 4.0000 mg | Freq: Four times a day (QID) | INTRAMUSCULAR | Status: DC | PRN

## 2018-08-06 MED ORDER — INSULIN ASPART 100 UNIT/ML ~~LOC~~ SOLN
0.0000 [IU] | Freq: Three times a day (TID) | SUBCUTANEOUS | Status: DC
  Administered 2018-08-06: 14:00:00 5 [IU] via SUBCUTANEOUS
  Administered 2018-08-06: 18:00:00 3 [IU] via SUBCUTANEOUS

## 2018-08-06 MED ORDER — HYDROCORTISONE 5 MG PO TABS: 5.0000 mg | ORAL_TABLET | ORAL | Status: DC

## 2018-08-06 MED ORDER — NICOTINE 21 MG/24HR TD PT24
21.0000 mg | MEDICATED_PATCH | Freq: Every day | TRANSDERMAL | Status: DC
  Administered 2018-08-06 – 2018-08-09 (×4): 21 mg via TRANSDERMAL
  Filled 2018-08-06 (×4): qty 1

## 2018-08-06 MED ORDER — FUROSEMIDE 10 MG/ML IJ SOLN
60.0000 mg | Freq: Once | INTRAMUSCULAR | Status: AC
  Administered 2018-08-06: 60 mg via INTRAVENOUS
  Filled 2018-08-06: qty 6

## 2018-08-06 MED ORDER — ASPIRIN EC 81 MG PO TBEC
81.0000 mg | DELAYED_RELEASE_TABLET | Freq: Every day | ORAL | Status: DC
  Administered 2018-08-07 – 2018-08-09 (×3): 81 mg via ORAL
  Filled 2018-08-06 (×3): qty 1

## 2018-08-06 MED ORDER — DULOXETINE HCL 60 MG PO CPEP
60.0000 mg | ORAL_CAPSULE | Freq: Every day | ORAL | Status: DC
  Administered 2018-08-06 – 2018-08-09 (×4): 60 mg via ORAL
  Filled 2018-08-06 (×4): qty 1

## 2018-08-06 MED ORDER — HYDROCORTISONE 5 MG PO TABS: 5.0000 mg | ORAL_TABLET | Freq: Every day | ORAL | Status: DC

## 2018-08-06 MED ORDER — AEROCHAMBER PLUS FLO-VU LARGE MISC
Status: AC
  Administered 2018-08-06: 04:00:00 1
  Filled 2018-08-06: qty 1

## 2018-08-06 MED ORDER — ACETAMINOPHEN 325 MG PO TABS: 650.0000 mg | ORAL_TABLET | ORAL | Status: DC | PRN

## 2018-08-06 MED ORDER — TRAMADOL HCL 50 MG PO TABS
50.0000 mg | ORAL_TABLET | Freq: Four times a day (QID) | ORAL | Status: DC | PRN
  Administered 2018-08-06 – 2018-08-07 (×3): 50 mg via ORAL
  Filled 2018-08-06 (×3): qty 1

## 2018-08-06 NOTE — ED Provider Notes (Signed)
TIME SEEN: 3:45 AM  CHIEF COMPLAINT: Shortness of breath  HPI: Patient is a 63 year old female with history of COPD, CHF on chronic oxygen, CAD status post stent, CKD, hypertension, hyperlipidemia, diabetes who presents to the emergency department with worsening shortness of breath from her baseline.  States she is normally on 2 L of oxygen at home but has had to increase this today.  Has had some intermittent chest tightness but states this does not feel like her previous heart attacks but feels like her stable angina.  She used nitro with relief this morning.  No fevers.  Does have cough that is nonproductive which is chronic.  She has had increased lower extremity swelling.  During her last admission, patient was transitioned from Lasix to torsemide.  States she takes torsemide 80 mg in the morning and 20 mg at night.  ROS: See HPI Constitutional: no fever  Eyes: no drainage  ENT: no runny nose   Cardiovascular: Intermittent chest pain  Resp:  SOB  GI: no vomiting GU: no dysuria Integumentary: no rash  Allergy: no hives  Musculoskeletal: no leg swelling  Neurological: no slurred speech ROS otherwise negative  PAST MEDICAL HISTORY/PAST SURGICAL HISTORY:  Past Medical History:  Diagnosis Date  . Acute on chronic systolic heart failure exacerbation(HCC) 04/08/2016  . Arthritis   . CAD in native artery    a. Prior LAD stenting based on cath. b. RCA stenting 03/2016 x2.  . Chronic combined systolic and diastolic CHF (congestive heart failure) ([place])   . CKD (chronic kidney disease), stage II   . COPD (chronic obstructive pulmonary disease) ([place])   . Diabetes mellitus without complication ([place])   . Hashimoto's thyroiditis   . Hyperlipidemia   . Hypertension   . Myocardial infarction ([place])   . On home oxygen therapy    "2L; 24/7" (10/23/2017)  . Secondary adrenal insufficiency ([place])   . Thyroid disease   . Tobacco abuse     MEDICATIONS:  Prior to Admission medications   Medication  Sig Start Date End Date Taking? Authorizing Provider  acetaminophen (TYLENOL) 500 MG tablet Take 1 tablet (500 mg total) by mouth every 6 (six) hours as needed for mild pain. 07/11/18   Mariel Aloe, MD  albuterol (PROAIR HFA) 108 (90 Base) MCG/ACT inhaler Inhale 2 puffs into the lungs every 4-6 hours as needed for shortness of breath or wheezing 01/31/18   Tanda Rockers, MD  albuterol (PROVENTIL) (2.5 MG/3ML) 0.083% nebulizer solution Take 3 mLs (2.5 mg total) by nebulization every 6 (six) hours as needed for wheezing or shortness of breath. 02/28/18   Tanda Rockers, MD  atorvastatin (LIPITOR) 20 MG tablet Take 20 mg by mouth every evening. 06/18/18   [provider]  CALCIUM PO Take 1 tablet by mouth daily.    [provider]  Cholecalciferol (VITAMIN D3) 2000 units capsule Take 2,000 Units by mouth daily.     [provider]  clopidogrel (PLAVIX) 75 MG tablet Take 1 tablet (75 mg total) by mouth daily. Patient taking differently: Take 75 mg by mouth at bedtime.  01/20/18   Masoudi, Dorthula Rue, MD  DULoxetine (CYMBALTA) 60 MG capsule Take 60 mg by mouth daily.  12/05/16   [provider]  Galcanezumab-gnlm (EMGALITY) 120 MG/ML SOSY Inject 120 mg into the skin every 30 (thirty) days.     [provider]  hydrocortisone (ANUSOL-HC) 2.5 % rectal cream Place rectally 2 (two) times daily. Patient taking differently: Place 1 application  rectally daily as needed for hemorrhoids or anal itching.  01/17/18   Masoudi, Elhamalsadat, MD  hydrocortisone (CORTEF) 5 MG tablet Take 5-15 mg by mouth See admin instructions. Take 15 mg in the morning and 5 mg in the evening    [provider]  hydrOXYzine (ATARAX/VISTARIL) 25 MG tablet Take 1 tablet by mouth 2 (two) times daily. 07/23/18 08/22/18  [provider]  insulin degludec (TRESIBA FLEXTOUCH) 100 UNIT/ML SOPN FlexTouch Pen Inject 42 Units into the skin at bedtime.  08/28/16   [provider]  Iron-FA-B Cmp-C-Biot-Probiotic (FUSION PLUS) CAPS Take 1 capsule by mouth at bedtime.  10/09/16   [provider]  levothyroxine (SYNTHROID, LEVOTHROID) 150 MCG tablet Take 150 mcg by mouth daily before breakfast.    [provider]  MAGNESIUM-OXIDE 400 (241.3 Mg) MG tablet Take 400 mg by mouth daily. 06/18/18   [provider]  nitroGLYCERIN (NITROSTAT) 0.4 MG SL tablet Place 1 tablet (0.4 mg total) under the tongue every 5 (five) minutes as needed for chest pain. 07/19/18   Revankar, Reita Cliche, MD  OXYGEN Place 2 L into the nose continuous.     [provider]  polyethylene glycol (MIRALAX / GLYCOLAX) packet Take 17 g by mouth daily. Patient taking differently: Take 17 g by mouth daily as needed for mild constipation.  12/27/17   Masoudi, Elhamalsadat, MD  potassium chloride SA (K-DUR,KLOR-CON) 20 MEQ tablet Take 1 tablet (20 mEq total) by mouth daily. Take when you take Torsemide. 01/17/18   Masoudi, Elhamalsadat, MD  Tiotropium Bromide-Olodaterol (STIOLTO RESPIMAT) 2.5-2.5 MCG/ACT AERS Inhale 2 puffs into the lungs daily. 01/31/18   Tanda Rockers, MD  torsemide (DEMADEX) 20 MG tablet Take 4 tablets (80 mg total) by mouth daily for 30 days. 07/22/18 08/21/18  Elodia [place]., MD  TRULICITY 1.5 ZO/1.0RU SOPN Inject 1.5 mg into the skin every Sunday.  10/10/16   [provider]    ALLERGIES:  Allergies  Allergen Reactions  . Hydroxychloroquine Shortness Of Breath, Nausea Only and Other (See Comments)    Dizziness  . Donepezil Other (See Comments)    Dizziness, depression, and makes the patient feel "funny"  . Prednisone Other (See Comments)    Causes depression and suicidal thoughts  . Anticoagulant Cit Dext [Acd Formula A] Other (See Comments)    Unknown  . Bupropion Other (See Comments)    Suicidal thoughts  . Metrizamide Other (See Comments)    (a non-ionic radiopaque contrast agent) "Blows the vein" and contrast gathers at the injected  site's limb  . Varenicline Other (See Comments)    Suicidal thoughts  . Tape Rash and Other (See Comments)    Paper tape is preferred, PLEASE    SOCIAL HISTORY:  Social History   Tobacco Use  . Smoking status: Current Every Day Smoker    Packs/day: 1.00    Years: 44.00    Pack years: 44.00    Types: Cigarettes  . Smokeless tobacco: Never Used  Substance Use Topics  . Alcohol use: Yes    Alcohol/week: 7.0 standard drinks    Types: 7 Shots of liquor per week    FAMILY HISTORY: Family History  Problem Relation Age of Onset  . Stroke Mother   . Diabetes Father   . Diabetes Sister   . Diabetes Sister   . Diabetes Son     EXAM: BP (!) 131/95   Pulse 99   Temp 98.2 F (36.8 C) (Oral)  Resp (!) 25   SpO2 97%  CONSTITUTIONAL: Alert and oriented and responds appropriately to questions.  Patient is chronically ill-appearing HEAD: Normocephalic EYES: Conjunctivae clear, pupils appear equal, EOMI ENT: normal nose; moist mucous membranes NECK: Supple, no meningismus, no nuchal rigidity, no LAD  CARD: RRR; S1 and S2 appreciated; no murmurs, no clicks, no rubs, no gallops RESP: Normal chest excursion without splinting; patient is tachypneic with any minimal movement or exertion in the bed, no hypoxia on nasal cannula, mild scattered expiratory wheezes but no rhonchi or rales, speaking short sentences ABD/GI: Normal bowel sounds; non-distended; soft, non-tender, no rebound, no guarding, no peritoneal signs, no hepatosplenomegaly BACK:  The back appears normal and is non-tender to palpation, there is no CVA tenderness EXT: Normal ROM in all joints; non-tender to palpation; 2+ pitting edema in bilateral lower extremities to the mid shin without redness or warmth SKIN: Normal color for age and race; warm; no rash NEURO: Moves all extremities equally PSYCH: The patient's mood and manner are appropriate. Grooming and personal hygiene are appropriate.  MEDICAL DECISION MAKING: Patient  here with shortness of breath.  States she is having intermittent chest pain as well but this feels like her stable angina.  Appears volume overloaded on exam.  She does have some scattered wheezes.  Differential includes CHF exacerbation, COPD exacerbation, pneumonia, ACS, PE.  Will give albuterol with spacer.  Will obtain labs, chest x-ray, EKG.  Patient will likely need admission especially given increased work of breathing with just minimal exertion today.  ED PROGRESS: Patient's chest x-ray shows chronic and stable interstitial markings without acute abnormality however she does appear volume overloaded on exam.  Will give dose of IV Lasix here in the emergency department.  Wheezing has resolved after albuterol inhaler.  May be a component of a COPD exacerbation as well.  Will give IV Solu-Medrol.  She still appears short of breath with increased work of breathing with minimal exertion.  States this is worse than baseline.  She does not feel safe going home.  She feels is an acute change from her baseline.  Will discuss with medicine for admission for continued diuresis.  She does state that she still having chest tightness but feels like this is tightness that she has with her COPD had not similar to her anginal equivalent.  She tried nitro at home tonight without any relief and refuses it now.  Asking for something for chest discomfort.  Will give dose of IV fentanyl.  Will have her give herself another 8 puffs of her albuterol inhaler as she is still tight on exam.   Patient's COVID-19 screening is negative.  She is not having infectious symptoms at this time and states she has been isolating at home.  Will give DuoNeb treatment.   5:40 AM Discussed patient's case with hospitalist, Dr. Hal Hope.  I have recommended admission and patient (and family if present) agree with this plan. Admitting physician will place admission orders.   I reviewed all nursing notes, vitals, pertinent previous  records, EKGs, lab and urine results, imaging (as available).    EKG Interpretation  Date/Time:  Monday Aug 06 2018 03:43:14 EDT Ventricular Rate:  93 PR Interval:    QRS Duration: 101 QT Interval:  373 QTC Calculation: 464 R Axis:   64 Text Interpretation:  Sinus rhythm Ventricular premature complex Biatrial enlargement No significant change since last tracing Confirmed by Tarrance Januszewski, Cyril Mourning [phone]) on 08/06/2018 3:50:30 AM  Stephanny Tsutsui, Delice Bison, DO 08/06/18 [phone]

## 2018-08-06 NOTE — Evaluation (Signed)
Physical Therapy Evaluation Patient Details Name: Crystal Yates MRN: [id number] DOB: [birth year] Today's Date: 08/06/2018   History of Present Illness  Patient is a 63 yo female who presents for Acute respiratory failure with hypoxia secondary to COPD exacerbation: Patient presents with progressively worsening her shortness of breath. Pmh CAD, CHF, DM, HTN, obesity, COPD, CKD and sx hx of lapy for SBO.  Clinical Impression  Orders received for PT evaluation. Patient demonstrates deficits in functional mobility as indicated below. Will benefit from continued skilled PT to address deficits and maximize function. Will see as indicated and progress as tolerated.  Will recommend HHPT upon initial discharge.    Follow Up Recommendations Home health PT;Supervision for mobility/OOB    Equipment Recommendations  None recommended by PT    Recommendations for Other Services       Precautions / Restrictions Precautions Precautions: Fall Precaution Comments: O2 dependent 2 liters Restrictions Weight Bearing Restrictions: No      Mobility  Bed Mobility Overal bed mobility: Modified Independent             General bed mobility comments: increased time and effort with rail and HOb elevated, but no physical assit required  Transfers Overall transfer level: Needs assistance Equipment used: 1 person hand held assist Transfers: Sit to/from [facility] Sit to Stand: Min assist Stand pivot transfers: Min assist       General transfer comment: min assist for stability, increased effort.  Ambulation/Gait Ambulation/Gait assistance: Min assist Gait Distance (Feet): 22 Feet Assistive device: 1 person hand held assist Gait Pattern/deviations: Step-through pattern;Decreased stride length;Wide base of support Gait velocity: decreased   General Gait Details: increased SOB with limited in room ambulation on 2 liters, saturation to 89%, HR to 117  Stairs             Wheelchair Mobility    Modified Rankin (Stroke Patients Only)       Balance                                             Pertinent Vitals/Pain Pain Assessment: No/denies pain    Home Living Family/patient expects to be discharged to:: Private residence Living Arrangements: Spouse/significant other Available Help at Discharge: Family;Available PRN/intermittently Type of Home: House Home Access: Stairs to enter Entrance Stairs-Rails: Left Entrance Stairs-Number of Steps: 4 Home Layout: One level Home Equipment: Shower seat;Walker - 4 wheels;Grab bars - tub/shower;Grab bars - toilet;Cane - single point;Adaptive equipment      Prior Function Level of Independence: Needs assistance   Gait / Transfers Assistance Needed: modified independent with rollator and O2  ADL's / Homemaking Assistance Needed: mod independent with ADLs using AE (sock aide) and DME  Comments: assist from son      Hand Dominance   Dominant Hand: Left    Extremity/Trunk Assessment   Upper Extremity Assessment Upper Extremity Assessment: Generalized weakness    Lower Extremity Assessment Lower Extremity Assessment: Generalized weakness    Cervical / Trunk Assessment Cervical / Trunk Assessment: (increased body habitus)  Communication   Communication: No difficulties  Cognition Arousal/Alertness: Awake/alert Behavior During Therapy: WFL for tasks assessed/performed Overall Cognitive Status: No family/caregiver present to determine baseline cognitive functioning  General Comments      Exercises     Assessment/Plan    PT Assessment Patient needs continued PT services  PT Problem List Decreased strength;Decreased balance;Decreased activity tolerance;Decreased mobility;Cardiopulmonary status limiting activity;Obesity       PT Treatment Interventions DME instruction;Gait training;Functional mobility  training;Therapeutic activities;Therapeutic exercise;Stair training;Balance training;Neuromuscular re-education;Patient/family education    PT Goals (Current goals can be found in the Care Plan section)  Acute Rehab PT Goals Patient Stated Goal: to go home PT Goal Formulation: With patient Time For Goal Achievement: 08/20/18 Potential to Achieve Goals: Good    Frequency Min 3X/week   Barriers to discharge        Co-evaluation               AM-PAC PT "6 Clicks" Mobility  Outcome Measure Help needed turning from your back to your side while in a flat bed without using bedrails?: None Help needed moving from lying on your back to sitting on the side of a flat bed without using bedrails?: None Help needed moving to and from a bed to a chair (including a wheelchair)?: A Little Help needed standing up from a chair using your arms (e.g., wheelchair or bedside chair)?: A Little Help needed to walk in hospital room?: A Little Help needed climbing 3-5 steps with a railing? : A Lot 6 Click Score: 19    End of Session Equipment Utilized During Treatment: Oxygen Activity Tolerance: Patient tolerated treatment well;Patient limited by fatigue Patient left: in bed;with call bell/phone within reach Nurse Communication: Mobility status PT Visit Diagnosis: Difficulty in walking, not elsewhere classified (R26.2)    Time: 5859-2924 PT Time Calculation (min) (ACUTE ONLY): 22 min   Charges:   PT Evaluation $PT Eval Moderate Complexity: 1 Mod          Alben Deeds, PT DPT  Board Certified Neurologic Specialist Acute Rehabilitation Services Pager [phone] Office [place] 08/06/2018, 3:33 PM

## 2018-08-06 NOTE — ED Notes (Signed)
ED TO INPATIENT HANDOFF REPORT  ED Nurse Name and Phone #:  Callie Fielding #[phone] S Name/Age/Gender Crystal Yates [place] 63 y.o. female Room/Bed: 027C/027C  Code Status   Code Status: Prior  Home/SNF/Other Home Patient oriented to: self, place, time and situation Is this baseline? Yes   Triage Complete: Triage complete  Chief Complaint sob  Triage Note Per ems pt was at home and started having more SOB and chest pain tonight than normal. Pt was here 2 weeks ago and had 6lbs of fluid removed. Breathing worse with exertion. Chest pain radiated to her back. Pt has pitting edema +2 in both legs. Pt is alert x 4. Sinus rhythm 126/98, hr 92,  2L at home. Pt took 1 nitro at home and helped with chest pain   Allergies Allergies  Allergen Reactions  . Hydroxychloroquine Shortness Of Breath, Nausea Only and Other (See Comments)    Dizziness  . Donepezil Other (See Comments)    Dizziness, depression, and makes the patient feel "funny"  . Prednisone Other (See Comments)    Causes depression and suicidal thoughts  . Anticoagulant Cit Dext [Acd Formula A] Other (See Comments)    Unknown  . Bupropion Other (See Comments)    Suicidal thoughts  . Metrizamide Other (See Comments)    (a non-ionic radiopaque contrast agent) "Blows the vein" and contrast gathers at the injected site's limb  . Varenicline Other (See Comments)    Suicidal thoughts  . Tape Rash and Other (See Comments)    Paper tape is preferred, PLEASE    Level of Care/Admitting Diagnosis ED Disposition    ED Disposition Condition [place]: [place] [100100]  Level of Care: Telemetry Cardiac [103]  I expect the patient will be discharged within 24 hours: No (not a candidate for 5C-Observation unit)  Covid Evaluation: N/A  Diagnosis: Acute respiratory failure with hypoxia [facility]) [998338]  Admitting Physician: Rise Patience [phone]  Attending Physician: Rise Patience Lei.Right  PT Class (Do Not Modify): Observation [104]  PT Acc Code (Do Not Modify): Observation [10022]       B Medical/Surgery History Past Medical History:  Diagnosis Date  . Acute on chronic systolic heart failure exacerbation(HCC) 04/08/2016  . Arthritis   . CAD in native artery    a. Prior LAD stenting based on cath. b. RCA stenting 03/2016 x2.  . Chronic combined systolic and diastolic CHF (congestive heart failure) ([place])   . CKD (chronic kidney disease), stage II   . COPD (chronic obstructive pulmonary disease) ([place])   . Diabetes mellitus without complication ([place])   . Hashimoto's thyroiditis   . Hyperlipidemia   . Hypertension   . Myocardial infarction ([place])   . On home oxygen therapy    "2L; 24/7" (10/23/2017)  . Secondary adrenal insufficiency ([place])   . Thyroid disease   . Tobacco abuse    Past Surgical History:  Procedure Laterality Date  . ABDOMINAL SURGERY    . CESAREAN SECTION    . CHOLECYSTECTOMY    . COLON RESECTION    . COLONOSCOPY WITH PROPOFOL N/A 01/16/2018   Procedure: COLONOSCOPY WITH PROPOFOL;  Surgeon: Rush Landmark Telford Nab., MD;  Location: [place];  Service: Gastroenterology;  Laterality: N/A;  . CORONARY BALLOON ANGIOPLASTY N/A 10/13/2017   Procedure: CORONARY BALLOON ANGIOPLASTY;  Surgeon: Belva Crome, MD;  Location: [place] CV LAB;  Service: Cardiovascular;  Laterality: N/A;  . HERNIA MESH REMOVAL    .  HERNIA REPAIR    . LEFT HEART CATH AND CORONARY ANGIOGRAPHY N/A 10/13/2016   Procedure: Left Heart Cath and Coronary Angiography;  Surgeon: Nelva Bush, MD;  Location: [place] [facility];  Service: Cardiovascular;  Laterality: N/A;  . POLYPECTOMY  01/16/2018   Procedure: POLYPECTOMY;  Surgeon: Rush Landmark Telford Nab., MD;  Location: [place];  Service: Gastroenterology;;  . RIGHT/LEFT HEART CATH AND CORONARY ANGIOGRAPHY N/A 10/13/2017   Procedure: RIGHT/LEFT HEART CATH AND CORONARY ANGIOGRAPHY;  Surgeon: Belva Crome,  MD;  Location: [place] [facility];  Service: Cardiovascular;  Laterality: N/A;  . SHOULDER ARTHROSCOPY    . TUBAL LIGATION       A IV Location/Drains/Wounds Patient Lines/Drains/Airways Status   Active Line/Drains/Airways    Name:   Placement date:   Placement time:   Site:   Days:   Peripheral IV 08/06/18 Right Antecubital   08/06/18    0329    Antecubital   less than 1   Wound / Incision (Open or Dehisced) 12/24/17 Other (Comment) Buttocks Right Stage II   12/24/17    1651    Buttocks   225   Wound / Incision (Open or Dehisced) 12/24/17 Other (Comment) Abdomen Anterior post surgical bowel resection    12/24/17    1656    Abdomen   225          Intake/Output Last 24 hours No intake or output data in the 24 hours ending 08/06/18 0553  Labs/Imaging Results for orders placed or performed during the hospital encounter of 08/06/18 (from the past 48 hour(s))  CBC with Differential     Status: Abnormal   Collection Time: 08/06/18  3:31 AM  Result Value Ref Range   WBC 10.8 (H) 4.0 - 10.5 K/uL   RBC 4.44 3.87 - 5.11 MIL/uL   Hemoglobin 13.3 12.0 - 15.0 g/dL   HCT 41.4 36.0 - 46.0 %   MCV 93.2 80.0 - 100.0 fL   MCH 30.0 26.0 - 34.0 pg   MCHC 32.1 30.0 - 36.0 g/dL   RDW 14.4 11.5 - 15.5 %   Platelets 197 150 - 400 K/uL   nRBC 0.0 0.0 - 0.2 %   Neutrophils Relative % 69 %   Neutro Abs 7.6 1.7 - 7.7 K/uL   Lymphocytes Relative 21 %   Lymphs Abs 2.2 0.7 - 4.0 K/uL   Monocytes Relative 6 %   Monocytes Absolute 0.7 0.1 - 1.0 K/uL   Eosinophils Relative 2 %   Eosinophils Absolute 0.2 0.0 - 0.5 K/uL   Basophils Relative 1 %   Basophils Absolute 0.1 0.0 - 0.1 K/uL   Immature Granulocytes 1 %   Abs Immature Granulocytes 0.06 0.00 - 0.07 K/uL    Comment: Performed at [facility], [address], [place], [place] [address]  Basic metabolic panel     Status: Abnormal   Collection Time: 08/06/18  3:31 AM  Result Value Ref Range   Sodium 141 135 - 145 mmol/L   Potassium 3.7 3.5 -  5.1 mmol/L   Chloride 98 98 - 111 mmol/L   CO2 32 22 - 32 mmol/L   Glucose, Bld 162 (H) 70 - 99 mg/dL   BUN 26 (H) 8 - 23 mg/dL   Creatinine, Ser 1.26 (H) 0.44 - 1.00 mg/dL   Calcium 9.4 8.9 - 10.3 mg/dL   GFR calc non Af Amer 45 (L) >60 mL/min   GFR calc Af Amer 53 (L) >60 mL/min  Anion gap 11 5 - 15    Comment: Performed at [facility] [address], [place], [place] [address]  Troponin I - ONCE - STAT     Status: Abnormal   Collection Time: 08/06/18  3:31 AM  Result Value Ref Range   Troponin I 0.04 (HH) <0.03 ng/mL    Comment: CRITICAL RESULT CALLED TO, READ BACK BY AND VERIFIED WITH: Baird [facility] A,RN 08/06/18 0436 WAYK Performed at [facility], [place] [address] [place], [place] [address]   Brain natriuretic peptide     Status: Abnormal   Collection Time: 08/06/18  3:31 AM  Result Value Ref Range   B Natriuretic Peptide 160.3 (H) 0.0 - 100.0 pg/mL    Comment: Performed at [facility] [address], [place], [place] [address]  SARS Coronavirus 2 (CEPHEID - Performed in [place] hospital lab), Hosp Order     Status: None   Collection Time: 08/06/18  3:36 AM  Result Value Ref Range   SARS Coronavirus 2 NEGATIVE NEGATIVE    Comment: (NOTE) If result is NEGATIVE SARS-CoV-2 target nucleic acids are NOT DETECTED. The SARS-CoV-2 RNA is generally detectable in upper and lower  respiratory specimens during the acute phase of infection. The lowest  concentration of SARS-CoV-2 viral copies this assay can detect is 250  copies / mL. A negative result does not preclude SARS-CoV-2 infection  and should not be used as the sole basis for treatment or other  patient management decisions.  A negative result may occur with  improper specimen collection / handling, submission of specimen other  than nasopharyngeal swab, presence of viral mutation(s) within the  areas targeted by this assay, and inadequate number of viral copies  (<250 copies / mL). A negative result  must be combined with clinical  observations, patient history, and epidemiological information. If result is POSITIVE SARS-CoV-2 target nucleic acids are DETECTED. The SARS-CoV-2 RNA is generally detectable in upper and lower  respiratory specimens dur ing the acute phase of infection.  Positive  results are indicative of active infection with SARS-CoV-2.  Clinical  correlation with patient history and other diagnostic information is  necessary to determine patient infection status.  Positive results do  not rule out bacterial infection or co-infection with other viruses. If result is PRESUMPTIVE POSTIVE SARS-CoV-2 nucleic acids MAY BE PRESENT.   A presumptive positive result was obtained on the submitted specimen  and confirmed on repeat testing.  While 2019 novel coronavirus  (SARS-CoV-2) nucleic acids may be present in the submitted sample  additional confirmatory testing may be necessary for epidemiological  and / or clinical management purposes  to differentiate between  SARS-CoV-2 and other Sarbecovirus currently known to infect humans.  If clinically indicated additional testing with an alternate test  methodology [id number]) is advised. The SARS-CoV-2 RNA is generally  detectable in upper and lower respiratory sp ecimens during the acute  phase of infection. The expected result is Negative. Fact Sheet for Patients:  StrictlyIdeas.no Fact Sheet for Healthcare Providers: BankingDealers.co.za This test is not yet approved or cleared by the [place] FDA and has been authorized for detection and/or diagnosis of SARS-CoV-2 by FDA under an Emergency Use Authorization (EUA).  This EUA will remain in effect (meaning this test can be used) for the duration of the COVID-19 declaration under Section 564(b)(1) of the Act, 21 U.S.C. section 360bbb-3(b)(1), unless the authorization is terminated or revoked sooner. Performed at [facility], [address] [place], [place] [address]   I-STAT 7, (LYTES, BLD GAS, ICA, H+H)     Status: Abnormal   Collection Time: 08/06/18  5:39 AM  Result Value Ref Range   pH, Arterial 7.466 (H) 7.350 - 7.450   pCO2 arterial 47.7 32.0 - 48.0 mmHg   pO2, Arterial 60.0 (L) 83.0 - 108.0 mmHg   Bicarbonate 34.5 (H) 20.0 - 28.0 mmol/L   TCO2 36 (H) 22 - 32 mmol/L   O2 Saturation 92.0 %   Acid-Base Excess 9.0 (H) 0.0 - 2.0 mmol/L   Sodium 142 135 - 145 mmol/L   Potassium 3.1 (L) 3.5 - 5.1 mmol/L   Calcium, Ion 1.18 1.15 - 1.40 mmol/L   HCT 37.0 36.0 - 46.0 %   Hemoglobin 12.6 12.0 - 15.0 g/dL   Patient temperature 98.2 F    Collection site RADIAL, ALLEN'S TEST ACCEPTABLE    Drawn by RT    Sample type ARTERIAL    Dg Chest Portable 1 View  Result Date: 08/06/2018 CLINICAL DATA:  63 year old female with shortness of breath and chest pain. COVID-19 test today pending, negative on 07/10/2018. EXAM: PORTABLE CHEST 1 VIEW COMPARISON:  07/19/2018 and earlier. FINDINGS: Portable AP semi upright view at 0336 hours. Stable lung volumes. Normal cardiac size and mediastinal contours. Visualized tracheal air column is within normal limits. No pneumothorax. Pulmonary vascularity appears stable and within normal limits. Basilar predominant increased interstitial markings appear chronic and stable. Similar chronic blunting of the left costophrenic angle. No acute pulmonary opacity. Prior ACDF. Stable visualized osseous structures. IMPRESSION: No acute cardiopulmonary abnormality. Electronically Signed   By: Genevie Ann M.D.   On: 08/06/2018 04:22    Pending Labs Unresulted Labs (From admission, onward)   None      Vitals/Pain Today's Vitals   08/06/18 0410 08/06/18 0430 08/06/18 0500 08/06/18 0553  BP: 129/75 140/79 (!) 146/76   Pulse: 94 89 85   Resp: 18 (!) 24    Temp:      TempSrc:      SpO2: 96% 99% 94%   PainSc:    0-No pain    Isolation Precautions No active  isolations  Medications Medications  nitroGLYCERIN (NITROSTAT) SL tablet 0.4 mg (has no administration in time range)  ipratropium-albuterol (DUONEB) 0.5-2.5 (3) MG/3ML nebulizer solution 3 mL (has no administration in time range)  albuterol (VENTOLIN HFA) 108 (90 Base) MCG/ACT inhaler 8 puff (8 puffs Inhalation Given 08/06/18 0414)  AeroChamber Plus Flo-Vu Large MISC 1 each (1 each Other Given 08/06/18 0415)  aspirin chewable tablet 324 mg (324 mg Oral Given 08/06/18 0413)  AeroChamber Plus Flo-Vu Large MISC (1 each  Given 08/06/18 0415)  methylPREDNISolone sodium succinate (SOLU-MEDROL) 125 mg/2 mL injection 125 mg (125 mg Intravenous Given 08/06/18 0537)  furosemide (LASIX) injection 60 mg (60 mg Intravenous Given 08/06/18 0537)  fentaNYL (SUBLIMAZE) injection 50 mcg (50 mcg Intravenous Given 08/06/18 0538)    Mobility walks Low fall risk   Focused Assessments Cardiac Assessment Handoff:    Lab Results  Component Value Date   TROPONINI 0.04 ([place]) 08/06/2018   Lab Results  Component Value Date   DDIMER 0.35 09/17/2017   Does the Patient currently have chest pain? No     R Recommendations: See Admitting Provider Note  Report given to:   Additional Notes: Alert oriented x 4

## 2018-08-06 NOTE — ED Triage Notes (Signed)
Per ems pt was at home and started having more SOB and chest pain tonight than normal. Pt was here 2 weeks ago and had 6lbs of fluid removed. Breathing worse with exertion. Chest pain radiated to her back. Pt has pitting edema +2 in both legs. Pt is alert x 4. Sinus rhythm 126/98, hr 92,  2L at home. Pt took 1 nitro at home and helped with chest pain

## 2018-08-06 NOTE — H&P (Addendum)
History and Physical    Crystal Yates KWI:[id number] DOB: [birth year] DOA: 08/06/2018  Referring MD/NP/PA: Gean Birchwood, MD PCP: Dortha Kern, PA  Patient coming from: Home  Chief Complaint: Swelling in her legs and shortness of shortness of breath  I have personally briefly reviewed patient's old medical records in [place]   HPI: Crystal Yates is a 63 y.o. female with medical history significant of combined systolic and diastolic CHF, HTN, HLD, COPD oxygen dependent on 2 L, CAD s/p stent, adrenal insufficiency, DM type II, CKD, obesity, and tobacco abuse; who presents with complaints of swelling in her legs and shortness of breath.  Swelling in her legs started approximately 3 to 4 days ago and states that her weight is around 222 pounds.  Patient reports taking 60 mg of torosemide in the morning and 20 mg at night, but admits that she may have missed 1 or 2 days of this medication.  She also reports that her breathing has been progressively getting worse and she has been wheezing.  Utilizing inhaler 3-4 times per day without significant change in symptoms.  She admits to having a chronic cough that has been intermittently productive with greenish sputum production.  Patient does admit to still continuing to smoke 1 pack cigarettes per day on average.  Crystal Yates has had urinary incontinence for years, but reports that when she is urinating she has worsening of her shortness of breath symptoms that no one has been able to figure out.  Other associated symptoms include chronic insomnia, dizziness, and  chest tightness that wraps around to her back that she later describes more so as an upper abdominal achiness.  Notes taking Tylenol without relief of symptoms at home.  Patient reports that she recently had a MRI of the abdomen last week [facility] for further investigation of her abdominal pain and to determine where mesh from previous procedure had  traveled. CT scan from 4/15, did note ventral hernia with a small loop of nonobstructed small bowel unchanged incarceration.  Denies having any fever, chills, nausea, vomiting, focal weakness, or recent falls.  Note patient had been admitted to the hospital twice last month with similar complaints thought to be secondary to congestive heart failure and/or COPD.  ED Course: Upon admission to the emergency department patient was found to be afebrile, respirations 18-25, O2 saturations 91-100% on home 2 L, and all other vital signs maintained.  Labs revealed WBC 10.8, BUN 26, creatinine 1.26, troponin I 0.04, and glucose 162.  COVID testing was negative.  Chest x-ray showed no acute findings listed just edema or infiltrate.  Patient was given 325 mg of aspirin, breathing treatments, 60 mg of Lasix IV,  50 mcg of fentanyl, and 125 mg of Solu-Medrol IV.   Review of Systems  Constitutional: Negative for chills and fever.  HENT: Negative for ear pain and sore throat.   Eyes: Negative for pain and discharge.  Respiratory: Positive for cough, sputum production, shortness of breath and wheezing.   Cardiovascular: Positive for leg swelling.  Gastrointestinal: Positive for abdominal pain. Negative for blood in stool, nausea and vomiting.  Genitourinary: Negative for flank pain.       Positive for urinary incontinence  Musculoskeletal: Negative for falls and neck pain.  Skin: Negative for itching and rash.  Neurological: Positive for dizziness. Negative for focal weakness and loss of consciousness.  Psychiatric/Behavioral: Negative for substance abuse. The patient has insomnia.     Past Medical History:  Diagnosis  Date   Acute on chronic systolic heart failure exacerbation(HCC) 04/08/2016   Arthritis    CAD in native artery    a. Prior LAD stenting based on cath. b. RCA stenting 03/2016 x2.   Chronic combined systolic and diastolic CHF (congestive heart failure) (HCC)    CKD (chronic kidney disease),  stage II    COPD (chronic obstructive pulmonary disease) (HCC)    Diabetes mellitus without complication ([place])    Hashimoto's thyroiditis    Hyperlipidemia    Hypertension    Myocardial infarction ([place])    On home oxygen therapy    "2L; 24/7" (10/23/2017)   Secondary adrenal insufficiency (HCC)    Thyroid disease    Tobacco abuse     Past Surgical History:  Procedure Laterality Date   ABDOMINAL SURGERY     CESAREAN SECTION     CHOLECYSTECTOMY     COLON RESECTION     COLONOSCOPY WITH PROPOFOL N/A 01/16/2018   Procedure: COLONOSCOPY WITH PROPOFOL;  Surgeon: Irving Copas., MD;  Location: [facility] ENDOSCOPY;  Service: Gastroenterology;  Laterality: N/A;   CORONARY BALLOON ANGIOPLASTY N/A 10/13/2017   Procedure: CORONARY BALLOON ANGIOPLASTY;  Surgeon: Belva Crome, MD;  Location: [place] CV LAB;  Service: Cardiovascular;  Laterality: N/A;   HERNIA MESH REMOVAL     HERNIA REPAIR     LEFT HEART CATH AND CORONARY ANGIOGRAPHY N/A 10/13/2016   Procedure: Left Heart Cath and Coronary Angiography;  Surgeon: Nelva Bush, MD;  Location: [place] CV LAB;  Service: Cardiovascular;  Laterality: N/A;   POLYPECTOMY  01/16/2018   Procedure: POLYPECTOMY;  Surgeon: Rush Landmark Telford Nab., MD;  Location: [facility] ENDOSCOPY;  Service: Gastroenterology;;   RIGHT/LEFT HEART CATH AND CORONARY ANGIOGRAPHY N/A 10/13/2017   Procedure: RIGHT/LEFT HEART CATH AND CORONARY ANGIOGRAPHY;  Surgeon: Belva Crome, MD;  Location: [place] CV LAB;  Service: Cardiovascular;  Laterality: N/A;   SHOULDER ARTHROSCOPY     TUBAL LIGATION       reports that she has been smoking cigarettes. She has a 44.00 pack-year smoking history. She has never used smokeless tobacco. She reports current alcohol use of about 7.0 standard drinks of alcohol per week. She reports previous drug use. Drug: Marijuana.  Allergies  Allergen Reactions   Hydroxychloroquine Shortness Of Breath, Nausea Only and  Other (See Comments)    Dizziness   Donepezil Other (See Comments)    Dizziness, depression, and makes the patient feel "funny"   Prednisone Other (See Comments)    Causes depression and suicidal thoughts   Anticoagulant Cit Dext [Acd Formula A] Other (See Comments)    Unknown   Bupropion Other (See Comments)    Suicidal thoughts   Metrizamide Other (See Comments)    (a non-ionic radiopaque contrast agent) "Blows the vein" and contrast gathers at the injected site's limb   Varenicline Other (See Comments)    Suicidal thoughts   Tape Rash and Other (See Comments)    Paper tape is preferred, PLEASE    Family History  Problem Relation Age of Onset   Stroke Mother    Diabetes Father    Diabetes Sister    Diabetes Sister    Diabetes Son     Prior to Admission medications   Medication Sig Start Date End Date Taking? Authorizing Provider  acetaminophen (TYLENOL) 500 MG tablet Take 1 tablet (500 mg total) by mouth every 6 (six) hours as needed for mild pain. 07/11/18  Yes Mariel Aloe, MD  albuterol [facility]  HFA) 108 (90 Base) MCG/ACT inhaler Inhale 2 puffs into the lungs every 4-6 hours as needed for shortness of breath or wheezing 01/31/18  Yes Tanda Rockers, MD  albuterol (PROVENTIL) (2.5 MG/3ML) 0.083% nebulizer solution Take 3 mLs (2.5 mg total) by nebulization every 6 (six) hours as needed for wheezing or shortness of breath. 02/28/18  Yes Tanda Rockers, MD  atorvastatin (LIPITOR) 20 MG tablet Take 20 mg by mouth every evening. 06/18/18  Yes [provider]  CALCIUM PO Take 1 tablet by mouth daily.   Yes [provider]  Cholecalciferol (VITAMIN D3) 2000 units capsule Take 2,000 Units by mouth daily.    Yes [provider]  clopidogrel (PLAVIX) 75 MG tablet Take 1 tablet (75 mg total) by mouth daily. Patient taking differently: Take 75 mg by mouth at bedtime.  01/20/18  Yes Masoudi, Elhamalsadat, MD  DULoxetine (CYMBALTA) 60 MG capsule Take  60 mg by mouth daily.  12/05/16  Yes [provider]  Galcanezumab-gnlm (EMGALITY) 120 MG/ML SOSY Inject 120 mg into the skin every 30 (thirty) days.    Yes [provider]  hydrocortisone (ANUSOL-HC) 2.5 % rectal cream Place rectally 2 (two) times daily. Patient taking differently: Place 1 application rectally daily as needed for hemorrhoids or anal itching.  01/17/18  Yes Masoudi, Elhamalsadat, MD  hydrocortisone (CORTEF) 5 MG tablet Take 5-15 mg by mouth See admin instructions. Take 15 mg in the morning and 5 mg in the evening   Yes [provider]  hydrOXYzine (ATARAX/VISTARIL) 25 MG tablet Take 1 tablet by mouth 2 (two) times daily. 07/23/18 08/22/18 Yes [provider]  insulin degludec (TRESIBA FLEXTOUCH) 100 UNIT/ML SOPN FlexTouch Pen Inject 42 Units into the skin at bedtime.  08/28/16  Yes [provider]  Iron-FA-B Cmp-C-Biot-Probiotic (FUSION PLUS) CAPS Take 1 capsule by mouth at bedtime.  10/09/16  Yes [provider]  levothyroxine (SYNTHROID, LEVOTHROID) 150 MCG tablet Take 150 mcg by mouth daily before breakfast.   Yes [provider]  MAGNESIUM-OXIDE 400 (241.3 Mg) MG tablet Take 400 mg by mouth daily. 06/18/18  Yes [provider]  nitroGLYCERIN (NITROSTAT) 0.4 MG SL tablet Place 1 tablet (0.4 mg total) under the tongue every 5 (five) minutes as needed for chest pain. 07/19/18  Yes Revankar, Reita Cliche, MD  OXYGEN Place 2 L into the nose continuous.    Yes [provider]  polyethylene glycol (MIRALAX / GLYCOLAX) packet Take 17 g by mouth daily. Patient taking differently: Take 17 g by mouth daily as needed for mild constipation.  12/27/17  Yes Masoudi, Elhamalsadat, MD  potassium chloride SA (K-DUR,KLOR-CON) 20 MEQ tablet Take 1 tablet (20 mEq total) by mouth daily. Take when you take Torsemide. 01/17/18  Yes Masoudi, Elhamalsadat, MD  Tiotropium Bromide-Olodaterol (STIOLTO RESPIMAT) 2.5-2.5 MCG/ACT AERS Inhale 2  puffs into the lungs daily. 01/31/18  Yes Tanda Rockers, MD  torsemide (DEMADEX) 20 MG tablet Take 4 tablets (80 mg total) by mouth daily for 30 days. 07/22/18 08/21/18 Yes Elodia Florence., MD  TRULICITY 1.5 XL/2.4MW SOPN Inject 1.5 mg into the skin every Sunday.  10/10/16  Yes [provider]    Physical Exam:  Constitutional: Obese female who appears lethargic but able to follow commands. Vitals:   08/06/18 0430 08/06/18 0500 08/06/18 0600 08/06/18 0702  BP: 140/79 (!) 146/76 120/64 128/64  Pulse: 89 85 87 95  Resp: (!) 24   20  Temp:    Marland Kitchen)  97 F (36.1 C)  TempSrc:      SpO2: 99% 94% 91% 100%   Eyes: PERRL, lids and conjunctivae normal ENMT: Mucous membranes are moist. Posterior pharynx clear of any exudate or lesions.  Neck: normal, supple, no masses, no thyromegaly.  No significant JVD Respiratory: Bilateral expiratory wheezes appreciated with prolonged expiratory phase.  Patient on 2.5 L of nasal cannula oxygen and able to talk in short sentences. Cardiovascular: Regular rate and rhythm, no murmurs / rubs / gallops.  1+ pitting lower extremity edema. 2+ pedal pulses. No carotid bruits.  Abdomen: Ventral hernia present with bowel sounds positive in all 4 quadrants. Musculoskeletal: no clubbing / cyanosis. No joint deformity upper and lower extremities. Good ROM, no contractures. Normal muscle tone.  Skin: no rashes, lesions, ulcers. No induration Neurologic: CN 2-12 grossly intact. Sensation intact, DTR normal. Strength 5/5 in all 4.  Psychiatric: Normal judgment and insight.  Lethargic and oriented x 3. Normal mood.     Labs on Admission: I have personally reviewed following labs and imaging studies  CBC: Recent Labs  Lab 08/06/18 0331 08/06/18 0539  WBC 10.8*  --   NEUTROABS 7.6  --   HGB 13.3 12.6  HCT 41.4 37.0  MCV 93.2  --   PLT 197  --    Basic Metabolic Panel: Recent Labs  Lab 08/06/18 0331 08/06/18 0539  NA 141 142  K 3.7 3.1*  CL 98  --    CO2 32  --   GLUCOSE 162*  --   BUN 26*  --   CREATININE 1.26*  --   CALCIUM 9.4  --    GFR: Estimated Creatinine Clearance: 50.7 mL/min (A) (by C-G formula based on SCr of 1.26 mg/dL (H)). Liver Function Tests: No results for input(s): AST, ALT, ALKPHOS, BILITOT, PROT, ALBUMIN in the last 168 hours. No results for input(s): LIPASE, AMYLASE in the last 168 hours. No results for input(s): AMMONIA in the last 168 hours. Coagulation Profile: No results for input(s): INR, PROTIME in the last 168 hours. Cardiac Enzymes: Recent Labs  Lab 08/06/18 0331  TROPONINI 0.04*   BNP (last 3 results) Recent Labs    01/31/18 1230 03/15/18 1025  PROBNP 1,553* 858*   HbA1C: No results for input(s): HGBA1C in the last 72 hours. CBG: No results for input(s): GLUCAP in the last 168 hours. Lipid Profile: No results for input(s): CHOL, HDL, LDLCALC, TRIG, CHOLHDL, LDLDIRECT in the last 72 hours. Thyroid Function Tests: No results for input(s): TSH, T4TOTAL, FREET4, T3FREE, THYROIDAB in the last 72 hours. Anemia Panel: No results for input(s): VITAMINB12, FOLATE, FERRITIN, TIBC, IRON, RETICCTPCT in the last 72 hours. Urine analysis:    Component Value Date/Time   COLORURINE STRAW (A) 07/21/2018 1733   APPEARANCEUR CLEAR 07/21/2018 1733   LABSPEC 1.009 07/21/2018 1733   PHURINE 7.0 07/21/2018 1733   GLUCOSEU NEGATIVE 07/21/2018 1733   HGBUR NEGATIVE 07/21/2018 1733   BILIRUBINUR NEGATIVE 07/21/2018 1733   KETONESUR NEGATIVE 07/21/2018 1733   PROTEINUR NEGATIVE 07/21/2018 1733   NITRITE NEGATIVE 07/21/2018 1733   LEUKOCYTESUR TRACE (A) 07/21/2018 1733   Sepsis Labs: Recent Results (from the past 240 hour(s))  SARS Coronavirus 2 (CEPHEID - Performed in [place] hospital lab), Hosp Order     Status: None   Collection Time: 08/06/18  3:36 AM  Result Value Ref Range Status   SARS Coronavirus 2 NEGATIVE NEGATIVE Final    Comment: (NOTE) If result is NEGATIVE SARS-CoV-2 target  nucleic acids  are NOT DETECTED. The SARS-CoV-2 RNA is generally detectable in upper and lower  respiratory specimens during the acute phase of infection. The lowest  concentration of SARS-CoV-2 viral copies this assay can detect is 250  copies / mL. A negative result does not preclude SARS-CoV-2 infection  and should not be used as the sole basis for treatment or other  patient management decisions.  A negative result may occur with  improper specimen collection / handling, submission of specimen other  than nasopharyngeal swab, presence of viral mutation(s) within the  areas targeted by this assay, and inadequate number of viral copies  (<250 copies / mL). A negative result must be combined with clinical  observations, patient history, and epidemiological information. If result is POSITIVE SARS-CoV-2 target nucleic acids are DETECTED. The SARS-CoV-2 RNA is generally detectable in upper and lower  respiratory specimens dur ing the acute phase of infection.  Positive  results are indicative of active infection with SARS-CoV-2.  Clinical  correlation with patient history and other diagnostic information is  necessary to determine patient infection status.  Positive results do  not rule out bacterial infection or co-infection with other viruses. If result is PRESUMPTIVE POSTIVE SARS-CoV-2 nucleic acids MAY BE PRESENT.   A presumptive positive result was obtained on the submitted specimen  and confirmed on repeat testing.  While 2019 novel coronavirus  (SARS-CoV-2) nucleic acids may be present in the submitted sample  additional confirmatory testing may be necessary for epidemiological  and / or clinical management purposes  to differentiate between  SARS-CoV-2 and other Sarbecovirus currently known to infect humans.  If clinically indicated additional testing with an alternate test  methodology [phone]) is advised. The SARS-CoV-2 RNA is generally  detectable in upper and lower  respiratory sp ecimens during the acute  phase of infection. The expected result is Negative. Fact Sheet for Patients:  StrictlyIdeas.no Fact Sheet for Healthcare Providers: BankingDealers.co.za This test is not yet approved or cleared by the [place] FDA and has been authorized for detection and/or diagnosis of SARS-CoV-2 by FDA under an Emergency Use Authorization (EUA).  This EUA will remain in effect (meaning this test can be used) for the duration of the COVID-19 declaration under Section 564(b)(1) of the Act, 21 U.S.C. section 360bbb-3(b)(1), unless the authorization is terminated or revoked sooner. Performed at [facility], [place] [address] [place], [place] [address]      Radiological Exams on Admission: Dg Chest Portable 1 View  Result Date: 08/06/2018 CLINICAL DATA:  63 year old female with shortness of breath and chest pain. COVID-19 test today pending, negative on 07/10/2018. EXAM: PORTABLE CHEST 1 VIEW COMPARISON:  07/19/2018 and earlier. FINDINGS: Portable AP semi upright view at 0336 hours. Stable lung volumes. Normal cardiac size and mediastinal contours. Visualized tracheal air column is within normal limits. No pneumothorax. Pulmonary vascularity appears stable and within normal limits. Basilar predominant increased interstitial markings appear chronic and stable. Similar chronic blunting of the left costophrenic angle. No acute pulmonary opacity. Prior ACDF. Stable visualized osseous structures. IMPRESSION: No acute cardiopulmonary abnormality. Electronically Signed   By: Genevie Ann M.D.   On: 08/06/2018 04:22    EKG: Independently reviewed.  Sinus rhythm at 93 bpm with biatrial enlargement and PVC, but appears similar to previous tracing.  QTc 464  Assessment/Plan Acute respiratory failure with hypoxia secondary to COPD exacerbation: Patient presents with progressively worsening her shortness of breath.  On physical  exam patient with diffuse bilateral wheezing noted.  ABG revealed  pH of 7.466, PCO2 47.7, and PO2 60.  Chest x-ray did not show any acute infiltrate or significant signs of edema.  Patient had tested negative for COVID 19.  Given 125 mg of Solu-Medrol IV along with breathing treatments -Admit to a telemetry bed -Continuous pulse oximetry with nasal cannula oxygen -Duonebs 4 times daily -Solu-Medrol 60 mg IV every 8 hours -Azithromycin   -Mucinex  Combined systolic and diastolic CHF: Patient reports worsening lower extremity edema with her estimated weight to be 222 lbs.  On physical exam she has 1+ pitting edema.  Review of records shows the patient's weight at recent discharge on 4/26 was around 221.98 lbs.  BNP currently lower than previous at 160.3.  Last echocardiogram revealed EF noted to be 40 to 45% with grade 1 diastolic dysfunction on 2/97/9892.  Patient had been given 60 mg of Lasix IV initially.  At this time she does not necessarily appear to be grossly fluid overloaded, but did note possibly missing a couple doses of her Torsemide.  He is followed by Dr. Agustin Cree of cardiology in outpatient setting. -Strict intake and output   -Daily weights -Give additional dose of furosemide 60 mg IV this evening -Reassess in a.m. and determine if patient can be continued on home torsemide or needs continued IV diuresis  Elevated troponin: Initial troponin noted to be mildly elevated at 0.04.  Patient was given full dose aspirin given reports of chest tightness. -Trend cardiac troponin  Chest tightness/upper abdominal pain suspected 2/2 ventral hernia: Acute on chronic.  It appears patient has had this previously evaluated and was noted to have a ventral hernia without signs of incarceration by CT on 4/15.  Notes of MRI of abdomen performed last week at [facility] -Tramadol as needed for pain -add on lipase -Order placed to obtain MRI report from [facility]  Leukocytosis: WBC 10.8  on admission.  Patient appears to be afebrile.  Reports urinary incontinence although makes it seem more chronic.  Could represent infection given and she is immunocompromised. -Checking urinalysis -Recheck CBC in a.m.  CAD: Patient with previous history of stents placed.  Last cardiac cath 10/13/2017 for which patient underwent PCI to distal right coronary artery was noted to consider complex PCI of patient had continued symptoms of angina. -Continue splinting, Plavix, and statin   Chronic kidney disease stage III: Stable.  Creatinine appears to be around 1-1.2 at baseline. -Recheck BMP in a.m.  Diabetes mellitus type 2: On admission blood glucose mildly elevated 162.  Patient's home regimen includes insulin Tresiba and Trulicity 42 units nightly.  Last available hemoglobin A1c noted to be 7.5 on 01/23/2018 on care everywhere. -Hypoglycemic protocol -Hold Trulicity  -Decreased long-acting insulin from 42 units down to 32 units nightly -CBG's  before meals with moderate SSI  Adrenal insufficiency on chronic immunosuppressive therapy: Stable. -Hold hydrocortisone while on IV Solu-Medrol  Hypothyroidism: Last available TSH noted to be 2.218 on 09/16/2017. -Check TSH -Continue levothyroxine  Essential hypertension: Blood pressures currently stable.  Patient had been on losartan but was discontinued during last hospitalization  Hyperlipidemia -Continue Lipitor  Chronic pain -Continue Cymbalta  Tobacco abuse: Patient still smokes 1 pack of cigarettes per day on average.  She is amenable to nicotine patch here in the hospital. -Nicotine patch -Counseled on need of cessation of tobacco use as it could be precipitating current symptoms  Morbid obesity: BMI 40.6 kg/m  Pulmonary nodule: Seen on previous CT on 4/15. -Will need outpatient surveillance CT monitoring  DVT prophylaxis: Lovenox Code Status: Full Family Communication: No family present at bedside Disposition Plan: Likely  home in 1 to 3 days depending on respiratory status Consults called: None  Admission status: Observation  Norval Morton MD [facility] Pager [phone]   If 7PM-7AM, please contact night-coverage [email or web address] Password [facility]  08/06/2018, 7:17 AM

## 2018-08-07 ENCOUNTER — Encounter (HOSPITAL_COMMUNITY): Payer: Self-pay

## 2018-08-07 DIAGNOSIS — D72829 Elevated white blood cell count, unspecified: Secondary | ICD-10-CM | POA: Diagnosis present

## 2018-08-07 DIAGNOSIS — Z79899 Other long term (current) drug therapy: Secondary | ICD-10-CM | POA: Diagnosis not present

## 2018-08-07 DIAGNOSIS — I2510 Atherosclerotic heart disease of native coronary artery without angina pectoris: Secondary | ICD-10-CM | POA: Diagnosis present

## 2018-08-07 DIAGNOSIS — J9621 Acute and chronic respiratory failure with hypoxia: Secondary | ICD-10-CM

## 2018-08-07 DIAGNOSIS — Z955 Presence of coronary angioplasty implant and graft: Secondary | ICD-10-CM | POA: Diagnosis not present

## 2018-08-07 DIAGNOSIS — N179 Acute kidney failure, unspecified: Secondary | ICD-10-CM | POA: Diagnosis present

## 2018-08-07 DIAGNOSIS — Z794 Long term (current) use of insulin: Secondary | ICD-10-CM | POA: Diagnosis not present

## 2018-08-07 DIAGNOSIS — J9622 Acute and chronic respiratory failure with hypercapnia: Secondary | ICD-10-CM

## 2018-08-07 DIAGNOSIS — I130 Hypertensive heart and chronic kidney disease with heart failure and stage 1 through stage 4 chronic kidney disease, or unspecified chronic kidney disease: Secondary | ICD-10-CM | POA: Diagnosis present

## 2018-08-07 DIAGNOSIS — E785 Hyperlipidemia, unspecified: Secondary | ICD-10-CM | POA: Diagnosis present

## 2018-08-07 DIAGNOSIS — E2740 Unspecified adrenocortical insufficiency: Secondary | ICD-10-CM | POA: Diagnosis present

## 2018-08-07 DIAGNOSIS — Z79890 Hormone replacement therapy (postmenopausal): Secondary | ICD-10-CM | POA: Diagnosis not present

## 2018-08-07 DIAGNOSIS — Z9981 Dependence on supplemental oxygen: Secondary | ICD-10-CM | POA: Diagnosis not present

## 2018-08-07 DIAGNOSIS — Z1159 Encounter for screening for other viral diseases: Secondary | ICD-10-CM | POA: Diagnosis not present

## 2018-08-07 DIAGNOSIS — F17210 Nicotine dependence, cigarettes, uncomplicated: Secondary | ICD-10-CM | POA: Diagnosis not present

## 2018-08-07 DIAGNOSIS — Z833 Family history of diabetes mellitus: Secondary | ICD-10-CM | POA: Diagnosis not present

## 2018-08-07 DIAGNOSIS — Z7902 Long term (current) use of antithrombotics/antiplatelets: Secondary | ICD-10-CM | POA: Diagnosis not present

## 2018-08-07 DIAGNOSIS — I5042 Chronic combined systolic (congestive) and diastolic (congestive) heart failure: Secondary | ICD-10-CM | POA: Diagnosis present

## 2018-08-07 DIAGNOSIS — K439 Ventral hernia without obstruction or gangrene: Secondary | ICD-10-CM | POA: Diagnosis present

## 2018-08-07 DIAGNOSIS — Z6841 Body Mass Index (BMI) 40.0 and over, adult: Secondary | ICD-10-CM | POA: Diagnosis not present

## 2018-08-07 DIAGNOSIS — J441 Chronic obstructive pulmonary disease with (acute) exacerbation: Secondary | ICD-10-CM | POA: Diagnosis not present

## 2018-08-07 DIAGNOSIS — E063 Autoimmune thyroiditis: Secondary | ICD-10-CM | POA: Diagnosis present

## 2018-08-07 DIAGNOSIS — N183 Chronic kidney disease, stage 3 (moderate): Secondary | ICD-10-CM | POA: Diagnosis present

## 2018-08-07 DIAGNOSIS — I252 Old myocardial infarction: Secondary | ICD-10-CM | POA: Diagnosis not present

## 2018-08-07 DIAGNOSIS — R0602 Shortness of breath: Secondary | ICD-10-CM | POA: Diagnosis present

## 2018-08-07 LAB — CBC WITH DIFFERENTIAL/PLATELET
Abs Immature Granulocytes: 0.06 10*3/uL (ref 0.00–0.07)
Basophils Absolute: 0 10*3/uL (ref 0.0–0.1)
Basophils Relative: 0 %
Eosinophils Absolute: 0 10*3/uL (ref 0.0–0.5)
Eosinophils Relative: 0 %
HCT: 40.3 % (ref 36.0–46.0)
Hemoglobin: 12.9 g/dL (ref 12.0–15.0)
Immature Granulocytes: 1 %
Lymphocytes Relative: 5 %
Lymphs Abs: 0.5 10*3/uL — ABNORMAL LOW (ref 0.7–4.0)
MCH: 29.8 pg (ref 26.0–34.0)
MCHC: 32 g/dL (ref 30.0–36.0)
MCV: 93.1 fL (ref 80.0–100.0)
Monocytes Absolute: 0.1 10*3/uL (ref 0.1–1.0)
Monocytes Relative: 1 %
Neutro Abs: 8.5 10*3/uL — ABNORMAL HIGH (ref 1.7–7.7)
Neutrophils Relative %: 93 %
Platelets: 170 10*3/uL (ref 150–400)
RBC: 4.33 MIL/uL (ref 3.87–5.11)
RDW: 14.2 % (ref 11.5–15.5)
WBC: 9.1 10*3/uL (ref 4.0–10.5)
nRBC: 0 % (ref 0.0–0.2)

## 2018-08-07 LAB — BASIC METABOLIC PANEL
Anion gap: 16 — ABNORMAL HIGH (ref 5–15)
BUN: 29 mg/dL — ABNORMAL HIGH (ref 8–23)
CO2: 28 mmol/L (ref 22–32)
Calcium: 9 mg/dL (ref 8.9–10.3)
Chloride: 94 mmol/L — ABNORMAL LOW (ref 98–111)
Creatinine, Ser: 1.39 mg/dL — ABNORMAL HIGH (ref 0.44–1.00)
GFR calc Af Amer: 47 mL/min — ABNORMAL LOW (ref >60)
GFR calc non Af Amer: 40 mL/min — ABNORMAL LOW (ref >60)
Glucose, Bld: 418 mg/dL — ABNORMAL HIGH (ref 70–99)
Potassium: 4.1 mmol/L (ref 3.5–5.1)
Sodium: 138 mmol/L (ref 135–145)

## 2018-08-07 LAB — GLUCOSE, CAPILLARY
Glucose-Capillary: 419 mg/dL — ABNORMAL HIGH (ref 70–99)
Glucose-Capillary: 366 mg/dL — ABNORMAL HIGH (ref 70–99)
Glucose-Capillary: 346 mg/dL — ABNORMAL HIGH (ref 70–99)
Glucose-Capillary: 489 mg/dL — ABNORMAL HIGH (ref 70–99)

## 2018-08-07 LAB — HEMOGLOBIN A1C
Hgb A1c MFr Bld: 8.6 % — ABNORMAL HIGH (ref 4.8–5.6)
Mean Plasma Glucose: 200.12 mg/dL

## 2018-08-07 MED ORDER — INSULIN GLARGINE 100 UNIT/ML ~~LOC~~ SOLN
42.0000 [IU] | Freq: Every day | SUBCUTANEOUS | Status: DC
  Administered 2018-08-07 – 2018-08-08 (×2): 42 [IU] via SUBCUTANEOUS
  Filled 2018-08-07 (×4): qty 0.42

## 2018-08-07 MED ORDER — INSULIN ASPART 100 UNIT/ML ~~LOC~~ SOLN
4.0000 [IU] | Freq: Three times a day (TID) | SUBCUTANEOUS | Status: DC
  Administered 2018-08-07 (×2): 4 [IU] via SUBCUTANEOUS

## 2018-08-07 MED ORDER — INSULIN ASPART 100 UNIT/ML ~~LOC~~ SOLN
0.0000 [IU] | Freq: Three times a day (TID) | SUBCUTANEOUS | Status: DC
  Administered 2018-08-07: 12:00:00 15 [IU] via SUBCUTANEOUS
  Administered 2018-08-07: 11 [IU] via SUBCUTANEOUS
  Administered 2018-08-08 (×2): 3 [IU] via SUBCUTANEOUS
  Administered 2018-08-08: 5 [IU] via SUBCUTANEOUS

## 2018-08-07 MED ORDER — INSULIN ASPART 100 UNIT/ML ~~LOC~~ SOLN: 5.0000 [IU] | Freq: Once | SUBCUTANEOUS | Status: DC

## 2018-08-07 MED ORDER — INSULIN GLARGINE 100 UNIT/ML ~~LOC~~ SOLN
10.0000 [IU] | Freq: Once | SUBCUTANEOUS | Status: AC
  Administered 2018-08-07: 12:00:00 10 [IU] via SUBCUTANEOUS
  Filled 2018-08-07: qty 0.1

## 2018-08-07 MED ORDER — MAGNESIUM SULFATE 2 GM/50ML IV SOLN
2.0000 g | Freq: Once | INTRAVENOUS | Status: AC
  Administered 2018-08-07: 2 g via INTRAVENOUS
  Filled 2018-08-07: qty 50

## 2018-08-07 MED ORDER — FLEET ENEMA 7-19 GM/118ML RE ENEM
1.0000 | ENEMA | Freq: Once | RECTAL | Status: AC
  Administered 2018-08-07: 1 via RECTAL
  Filled 2018-08-07: qty 1

## 2018-08-07 MED ORDER — INSULIN ASPART 100 UNIT/ML ~~LOC~~ SOLN
10.0000 [IU] | Freq: Once | SUBCUTANEOUS | Status: AC
  Administered 2018-08-07: 10 [IU] via SUBCUTANEOUS

## 2018-08-07 MED ORDER — INSULIN ASPART 100 UNIT/ML ~~LOC~~ SOLN
15.0000 [IU] | Freq: Once | SUBCUTANEOUS | Status: AC
  Administered 2018-08-07: 07:00:00 15 [IU] via SUBCUTANEOUS

## 2018-08-07 MED ORDER — TORSEMIDE 20 MG PO TABS
80.0000 mg | ORAL_TABLET | Freq: Every day | ORAL | Status: DC
  Administered 2018-08-07 – 2018-08-09 (×3): 80 mg via ORAL
  Filled 2018-08-07 (×3): qty 4

## 2018-08-07 MED ORDER — INSULIN ASPART 100 UNIT/ML ~~LOC~~ SOLN: 0.0000 [IU] | Freq: Every day | SUBCUTANEOUS | Status: DC

## 2018-08-07 NOTE — Progress Notes (Signed)
Physical Therapy Treatment Patient Details Name: Crystal Yates MRN: [id number] DOB: 1955/09/17 Today's Date: 08/07/2018    History of Present Illness Patient is a 63 yo female who presents for Acute respiratory failure with hypoxia secondary to COPD exacerbation: Patient presents with progressively worsening her shortness of breath. Pmh CAD, CHF, DM, HTN, obesity, COPD, CKD and sx hx of lapy for SBO.    PT Comments    Patient seen for activity progression. Tolerated mobility well. Ambulated on 3 liters with saturations stable >92% and modest DOE. HR to mid 110s with 2 standing rest breaks. Current POC remains appropriate.   Follow Up Recommendations  Home health PT;Supervision for mobility/OOB     Equipment Recommendations  None recommended by PT    Recommendations for Other Services       Precautions / Restrictions Precautions Precautions: Fall Precaution Comments: O2 dependent 2 liters Restrictions Weight Bearing Restrictions: No    Mobility  Bed Mobility Overal bed mobility: Modified Independent             General bed mobility comments: increased time and effort with rail and HOb elevated, but no physical assit required  Transfers Overall transfer level: Needs assistance Equipment used: 4-wheeled walker Transfers: Sit to/from [facility] Sit to Stand: Min guard         General transfer comment: min guard for safety and stability  Ambulation/Gait Ambulation/Gait assistance: Min guard Gait Distance (Feet): 160 Feet Assistive device: 4-wheeled walker Gait Pattern/deviations: Step-through pattern;Decreased stride length;Wide base of support Gait velocity: decreased   General Gait Details: stable saturations with modest SOB with activity, ambualted on 3 liters today with mutliple rest breaks   Stairs             Wheelchair Mobility    Modified Rankin (Stroke Patients Only)       Balance Overall balance assessment:  Mild deficits observed, not formally tested                                          Cognition Arousal/Alertness: Awake/alert Behavior During Therapy: WFL for tasks assessed/performed Overall Cognitive Status: No family/caregiver present to determine baseline cognitive functioning                                        Exercises      General Comments        Pertinent Vitals/Pain Pain Assessment: No/denies pain    Home Living                      Prior Function            PT Goals (current goals can now be found in the care plan section) Acute Rehab PT Goals Patient Stated Goal: to go home PT Goal Formulation: With patient Time For Goal Achievement: 08/20/18 Potential to Achieve Goals: Good Progress towards PT goals: Progressing toward goals    Frequency    Min 3X/week      PT Plan Current plan remains appropriate    Co-evaluation              AM-PAC PT "6 Clicks" Mobility   Outcome Measure  Help needed turning from your back to your side while in a flat bed without using bedrails?: None Help  needed moving from lying on your back to sitting on the side of a flat bed without using bedrails?: None Help needed moving to and from a bed to a chair (including a wheelchair)?: A Little Help needed standing up from a chair using your arms (e.g., wheelchair or bedside chair)?: A Little Help needed to walk in hospital room?: A Little Help needed climbing 3-5 steps with a railing? : A Lot 6 Click Score: 19    End of Session Equipment Utilized During Treatment: Oxygen Activity Tolerance: Patient tolerated treatment well;Patient limited by fatigue Patient left: in bed;with call bell/phone within reach Nurse Communication: Mobility status PT Visit Diagnosis: Difficulty in walking, not elsewhere classified (R26.2)     Time: 1610-9604 PT Time Calculation (min) (ACUTE ONLY): 22 min  Charges:  $Gait Training: 8-22  mins                     Alben Deeds, PT DPT  Board Certified Neurologic Specialist Acute Rehabilitation Services Pager [phone] Office [place] 08/07/2018, 2:09 PM

## 2018-08-07 NOTE — Progress Notes (Signed)
Telemetry reviewed and patient had 22 beat run of v-tach. Patient denies being aware of episode, asymptomatic. Labs reviewed and MD notified of incident. New orders placed.

## 2018-08-07 NOTE — Progress Notes (Signed)
CBG 419.  RN paged for further orders

## 2018-08-07 NOTE — Progress Notes (Signed)
CBG 489. [place] notified. Orders to give 10 units of novolog. Will continue to monitor.

## 2018-08-07 NOTE — Progress Notes (Signed)
[facility] PROGRESS NOTE  Crystal Yates Like RKY:[id number] DOB: June 08, 1955 DOA: 08/06/2018 PCP: Dortha Kern, PA  Assessment/Plan:  Acute on chronic respiratory failure with hypoxia secondary to COPD exacerbation: Oxygen saturation level 92% on 2L.  Patient presented with progressively worsening her shortness of breath. ABG revealed pH of 7.466, PCO2 47.7, and PO2 60. Chest x-ray did not show any acute infiltrate or significant signs of edema. Patient negative for COVID 19.  Given 125 mg of Solu-Medrol IV along with breathing treatments - continue Duonebs 4 times daily -continue Solu-Medrol 60 mg IV every 8 hours -Azithromycin   -Mucinex  Combined systolic and diastolic CHF: Patient reported worsening lower extremity edema. Reportedly she missed a dose of torsemide. BNP currently lower than previous at 160.3. volume status -367ml. Weight stable. Last echocardiogram revealed EF noted to be 40 to 45% with grade 1 diastolic dysfunction on 06/10/1759.  Patient had been given 60 mg of Lasix IV initially in ED. He is followed by Dr. Agustin Cree of cardiology in outpatient setting. -resume home torsemide -Strict intake and output   -Daily weights  Elevated troponin: Initial troponin noted to be mildly elevated at 0.04. subsequent levels 0.03. Patient was given full dose aspirin. No complaints of chest pain. No events on tele -continue asa -monitor  Chest tightness/upper abdominal pain suspected 2/2 ventral hernia: Acute on chronic.  It appears patient has had this previously evaluated and was noted to have a ventral hernia without signs of incarceration by CT on 4/15.  Notes of MRI of abdomen performed last week at [facility] -Tramadol as needed for pain -add on lipase -follow  MRI report from [facility]  Leukocytosis: resolved this am. Remains afebrile non-toxic appearing.  Reports stress incontinence. Urinalysis with leuks and nitrites. No symptoms.   -monitor -Recheck CBC in a.m.  CAD: Patient with previous history of stents placed.  Last cardiac cath 10/13/2017 for which patient underwent PCI to distal right coronary artery was noted to consider complex PCI of patient had continued symptoms of angina. -Continue splinting, Plavix, and statin   Chronic kidney disease stage III: creatinine trending up. Likely related to IV lasix in ED.  Creatinine appears to be around 1-1.2 at baseline. -Recheck BMP in a.m.  Diabetes mellitus type 2:  Patient's home regimen includes insulin Tresiba and Trulicity 42 units nightly.  Last available hemoglobin A1c noted to be 7.5 on 01/23/2018 on care everywhere. Poor control related to steroids -increase lantus to home 43u -add meal coverage with parameters -Hypoglycemic protocol -Hold Trulicity  -CBG's  before meals with moderate SSI  Adrenal insufficiency on chronic immunosuppressive therapy: Stable. -Hold hydrocortisone while on IV Solu-Medrol  Hypothyroidism: Last available TSH 1.3. -Continue levothyroxine  Essential hypertension: controlled.  Patient had been on losartan but was discontinued during last hospitalization  Hyperlipidemia -Continue Lipitor  Chronic pain -Continue Cymbalta  Tobacco abuse: Patient still smokes 1 pack of cigarettes per day on average.  She is amenable to nicotine patch here in the hospital. -Nicotine patch -Counseled on need of cessation of tobacco use as it could be precipitating current symptoms  Morbid obesity: BMI 40.6 kg/m  Pulmonary nodule: Seen on previous CT on 4/15. -Will need outpatient surveillance CT monitoring    Code Status: full Family Communication: discussed plan with patient Disposition Plan: home hopefully 34-48 hours   Consultants:    Procedures:    Antibiotics:  azithromycin  HPI/Subjective: 63 yo hx chf, chronic respiratory failure, diabetes, obesity, cad, adrenal insufficiency, copd, tobacco user admitted  with acute on chronic resp failure related to copd exacerbation in setting of mild chf exacerbation.   Objective: Vitals:   08/07/18 0900 08/07/18 1146  BP:  133/68  Pulse:  94  Resp:  18  Temp:    SpO2: 92% 92%    Intake/Output Summary (Last 24 hours) at 08/07/2018 1157 Last data filed at 08/07/2018 1037 Gross per 24 hour  Intake 1029 ml  Output 1225 ml  Net -196 ml   Filed Weights   08/06/18 0923 08/07/18 0510  Weight: 101.5 kg 100.4 kg    Exam:   General:  Awake alert chatty no acute distress  Cardiovascular: rrr no mgr trace le edema  Respiratory: mild increased work of breathing with conversation. Coughing during exam. BS quite coarse with rhonchi and diffuse wheeze  Abdomen: obese +BS no guarding or rebounding  Musculoskeletal: joints without swelling/erythema   Data Reviewed: Basic Metabolic Panel: Recent Labs  Lab 08/06/18 0331 08/06/18 0539 08/06/18 0751 08/07/18 0531  NA 141 142  --  138  K 3.7 3.1*  --  4.1  CL 98  --   --  94*  CO2 32  --   --  28  GLUCOSE 162*  --   --  418*  BUN 26*  --   --  29*  CREATININE 1.26*  --   --  1.39*  CALCIUM 9.4  --   --  9.0  MG  --   --  1.8  --    Liver Function Tests: No results for input(s): AST, ALT, ALKPHOS, BILITOT, PROT, ALBUMIN in the last 168 hours. No results for input(s): LIPASE, AMYLASE in the last 168 hours. No results for input(s): AMMONIA in the last 168 hours. CBC: Recent Labs  Lab 08/06/18 0331 08/06/18 0539 08/07/18 0531  WBC 10.8*  --  9.1  NEUTROABS 7.6  --  8.5*  HGB 13.3 12.6 12.9  HCT 41.4 37.0 40.3  MCV 93.2  --  93.1  PLT 197  --  170   Cardiac Enzymes: Recent Labs  Lab 08/06/18 0331 08/06/18 0751 08/06/18 1320 08/06/18 1903  TROPONINI 0.04* 0.04* 0.03* 0.03*   BNP (last 3 results) Recent Labs    07/10/18 0206 07/19/18 2058 08/06/18 0331  BNP 202.8* 671.6* 160.3*    ProBNP (last 3 results) Recent Labs    01/31/18 1230 03/15/18 1025  PROBNP 1,553* 858*     CBG: Recent Labs  Lab 08/06/18 1320 08/06/18 1645 08/06/18 2108 08/07/18 0630 08/07/18 1142  GLUCAP 236* 133* 156* 419* 366*    Recent Results (from the past 240 hour(s))  SARS Coronavirus 2 (CEPHEID - Performed in [place] hospital lab), Hosp Order     Status: None   Collection Time: 08/06/18  3:36 AM  Result Value Ref Range Status   SARS Coronavirus 2 NEGATIVE NEGATIVE Final    Comment: (NOTE) If result is NEGATIVE SARS-CoV-2 target nucleic acids are NOT DETECTED. The SARS-CoV-2 RNA is generally detectable in upper and lower  respiratory specimens during the acute phase of infection. The lowest  concentration of SARS-CoV-2 viral copies this assay can detect is 250  copies / mL. A negative result does not preclude SARS-CoV-2 infection  and should not be used as the sole basis for treatment or other  patient management decisions.  A negative result may occur with  improper specimen collection / handling, submission of specimen other  than nasopharyngeal swab, presence of viral mutation(s) within the  areas targeted by this  assay, and inadequate number of viral copies  (<250 copies / mL). A negative result must be combined with clinical  observations, patient history, and epidemiological information. If result is POSITIVE SARS-CoV-2 target nucleic acids are DETECTED. The SARS-CoV-2 RNA is generally detectable in upper and lower  respiratory specimens dur ing the acute phase of infection.  Positive  results are indicative of active infection with SARS-CoV-2.  Clinical  correlation with patient history and other diagnostic information is  necessary to determine patient infection status.  Positive results do  not rule out bacterial infection or co-infection with other viruses. If result is PRESUMPTIVE POSTIVE SARS-CoV-2 nucleic acids MAY BE PRESENT.   A presumptive positive result was obtained on the submitted specimen  and confirmed on repeat testing.  While 2019 novel  coronavirus  (SARS-CoV-2) nucleic acids may be present in the submitted sample  additional confirmatory testing may be necessary for epidemiological  and / or clinical management purposes  to differentiate between  SARS-CoV-2 and other Sarbecovirus currently known to infect humans.  If clinically indicated additional testing with an alternate test  methodology [phone]) is advised. The SARS-CoV-2 RNA is generally  detectable in upper and lower respiratory sp ecimens during the acute  phase of infection. The expected result is Negative. Fact Sheet for Patients:  StrictlyIdeas.no Fact Sheet for Healthcare Providers: BankingDealers.co.za This test is not yet approved or cleared by the [place] FDA and has been authorized for detection and/or diagnosis of SARS-CoV-2 by FDA under an Emergency Use Authorization (EUA).  This EUA will remain in effect (meaning this test can be used) for the duration of the COVID-19 declaration under Section 564(b)(1) of the Act, 21 U.S.C. section 360bbb-3(b)(1), unless the authorization is terminated or revoked sooner. Performed at [facility], [place] [address] [place], [place] [address]      Studies: Dg Chest Portable 1 View  Result Date: 08/06/2018 CLINICAL DATA:  63 year old female with shortness of breath and chest pain. COVID-19 test today pending, negative on 07/10/2018. EXAM: PORTABLE CHEST 1 VIEW COMPARISON:  07/19/2018 and earlier. FINDINGS: Portable AP semi upright view at 0336 hours. Stable lung volumes. Normal cardiac size and mediastinal contours. Visualized tracheal air column is within normal limits. No pneumothorax. Pulmonary vascularity appears stable and within normal limits. Basilar predominant increased interstitial markings appear chronic and stable. Similar chronic blunting of the left costophrenic angle. No acute pulmonary opacity. Prior ACDF. Stable visualized osseous structures.  IMPRESSION: No acute cardiopulmonary abnormality. Electronically Signed   By: Genevie Ann M.D.   On: 08/06/2018 04:22    Scheduled Meds: . aspirin EC  81 mg Oral Daily  . atorvastatin  20 mg Oral QPM  . azithromycin  250 mg Oral Daily  . clopidogrel  75 mg Oral QHS  . DULoxetine  60 mg Oral Daily  . enoxaparin (LOVENOX) injection  40 mg Subcutaneous Daily  . ferrous [id number] C-folic acid  1 capsule Oral QHS  . guaiFENesin  600 mg Oral BID  . insulin aspart  0-15 Units Subcutaneous TID WC  . insulin aspart  0-5 Units Subcutaneous QHS  . insulin aspart  4 Units Subcutaneous TID WC  . insulin glargine  10 Units Subcutaneous Once  . insulin glargine  42 Units Subcutaneous QHS  . ipratropium-albuterol  3 mL Nebulization QID  . levothyroxine  150 mcg Oral QAC breakfast  . magnesium oxide  400 mg Oral Daily  . methylPREDNISolone (SOLU-MEDROL) injection  60 mg Intravenous Q8H  . nicotine  21 mg Transdermal Daily  . potassium chloride SA  20 mEq Oral Daily  . sodium chloride flush  3 mL Intravenous Q12H  . torsemide  80 mg Oral Daily   Continuous Infusions: . sodium chloride      Principal Problem:   Acute on chronic respiratory failure with hypoxia and hypercapnia (HCC) Active Problems:   Adrenal insufficiency (HCC)   Diabetes mellitus with no complication (HCC)   COPD exacerbation (HCC)   Acute on chronic combined systolic and diastolic heart failure (HCC)   CAD (coronary artery disease)   Morbid obesity (HCC)   Cigarette smoker   Hyperlipemia   Ventral hernia    Time spent: 27 minutes    [place] NP  [facility]  If 7PM-7AM, please contact night-coverage at [email or web address], password [facility] 08/07/2018, 11:57 AM  LOS: 0 days

## 2018-08-07 NOTE — Progress Notes (Signed)
Inpatient Diabetes Program Recommendations  AACE/ADA: New Consensus Statement on Inpatient Glycemic Control (2015)  Target Ranges:  Prepandial:   less than 140 mg/dL      Peak postprandial:   less than 180 mg/dL (1-2 hours)      Critically ill patients:  140 - 180 mg/dL   Lab Results  Component Value Date   GLUCAP 419 (H) 08/07/2018   HGBA1C 10.5 (H) 09/16/2017    Results for Crystal, Yates (MRN [id number]) as of 08/07/2018 09:32  Ref. Range 08/06/2018 13:20 08/06/2018 16:45 08/06/2018 21:08 08/07/2018 06:30  Glucose-Capillary Latest Ref Range: 70 - 99 mg/dL 236 (H) 133 (H) 156 (H) 419 (H)     Review of Glycemic Control  Diabetes history: DM2  Outpatient Diabetes medications: Tresiba 42 units QHS                                                        Trulicity 1.5mg  SQ weekly  Current orders for Inpatient glycemic control: Lantus 32 units Q HS                                                                          Novolog moderate scale (0-15 units) tid   Solumedrol 60 mg Q 8 hours    Lab glucose on admission was 162mg /dl. Patient received Solumedrol 125mg  5/11 at 0500 ; Solumedrol 60 mg at 2127 last night and 0700 this am. Eating 100% of her meals. Steroids have contributed to increase in blood sugar. As steroids are reduced then likely need for insulin will also decrease.   MD please consider the following inpatient insulin adjustments:  1. Increase Lantus to home dose of 42 units QHS  2. One time additional Lantus 10 unit dose for this am  3. Add Novolog 4 units meal coverage tid (Please add the following hold parameters:  Hold if NPO or patient eats less than 50% of meal.)  4. Add (0-5) unit hs scale to current Novolog correction scale    -- Will follow during hospitalization.--  Jonna Clark RN, MSN Diabetes Coordinator Inpatient Glycemic Control Team Team Pager: [phone] (8am-5pm)

## 2018-08-07 NOTE — Progress Notes (Signed)
Per Schorr, NP give 15 units

## 2018-08-08 LAB — CBC
HCT: 37 % (ref 36.0–46.0)
Hemoglobin: 12.2 g/dL (ref 12.0–15.0)
MCH: 30.1 pg (ref 26.0–34.0)
MCHC: 33 g/dL (ref 30.0–36.0)
MCV: 91.4 fL (ref 80.0–100.0)
Platelets: 198 10*3/uL (ref 150–400)
RBC: 4.05 MIL/uL (ref 3.87–5.11)
RDW: 14.2 % (ref 11.5–15.5)
WBC: 15.9 10*3/uL — ABNORMAL HIGH (ref 4.0–10.5)
nRBC: 0 % (ref 0.0–0.2)

## 2018-08-08 LAB — BASIC METABOLIC PANEL
Anion gap: 13 (ref 5–15)
BUN: 40 mg/dL — ABNORMAL HIGH (ref 8–23)
CO2: 30 mmol/L (ref 22–32)
Calcium: 8.8 mg/dL — ABNORMAL LOW (ref 8.9–10.3)
Chloride: 97 mmol/L — ABNORMAL LOW (ref 98–111)
Creatinine, Ser: 1.51 mg/dL — ABNORMAL HIGH (ref 0.44–1.00)
GFR calc Af Amer: 42 mL/min — ABNORMAL LOW (ref >60)
GFR calc non Af Amer: 36 mL/min — ABNORMAL LOW (ref >60)
Glucose, Bld: 346 mg/dL — ABNORMAL HIGH (ref 70–99)
Potassium: 3.7 mmol/L (ref 3.5–5.1)
Sodium: 140 mmol/L (ref 135–145)

## 2018-08-08 LAB — GLUCOSE, CAPILLARY
Glucose-Capillary: 297 mg/dL — ABNORMAL HIGH (ref 70–99)
Glucose-Capillary: 220 mg/dL — ABNORMAL HIGH (ref 70–99)
Glucose-Capillary: 171 mg/dL — ABNORMAL HIGH (ref 70–99)
Glucose-Capillary: 156 mg/dL — ABNORMAL HIGH (ref 70–99)

## 2018-08-08 MED ORDER — INSULIN ASPART 100 UNIT/ML ~~LOC~~ SOLN
6.0000 [IU] | Freq: Three times a day (TID) | SUBCUTANEOUS | Status: DC
  Administered 2018-08-08 – 2018-08-09 (×4): 6 [IU] via SUBCUTANEOUS

## 2018-08-08 MED ORDER — HYDROCORTISONE 5 MG PO TABS
15.0000 mg | ORAL_TABLET | Freq: Every day | ORAL | Status: DC
  Administered 2018-08-09: 08:00:00 15 mg via ORAL
  Filled 2018-08-08: qty 3

## 2018-08-08 MED ORDER — HYDROCORTISONE 5 MG PO TABS
5.0000 mg | ORAL_TABLET | Freq: Every day | ORAL | Status: DC
  Administered 2018-08-08: 17:00:00 5 mg via ORAL
  Filled 2018-08-08 (×2): qty 1

## 2018-08-08 MED ORDER — IPRATROPIUM-ALBUTEROL 0.5-2.5 (3) MG/3ML IN SOLN
3.0000 mL | Freq: Three times a day (TID) | RESPIRATORY_TRACT | Status: DC
  Administered 2018-08-08 – 2018-08-09 (×3): 3 mL via RESPIRATORY_TRACT
  Filled 2018-08-08 (×3): qty 3

## 2018-08-08 MED ORDER — AQUAPHOR EX OINT
TOPICAL_OINTMENT | Freq: Every day | CUTANEOUS | Status: DC | PRN
  Administered 2018-08-08: 17:00:00 via TOPICAL
  Filled 2018-08-08: qty 50

## 2018-08-08 MED ORDER — PREDNISONE 20 MG PO TABS: 40.0000 mg | ORAL_TABLET | Freq: Every day | ORAL | Status: DC

## 2018-08-08 NOTE — Progress Notes (Signed)
Inpatient Diabetes Program Recommendations  AACE/ADA: New Consensus Statement on Inpatient Glycemic Control (2015)  Target Ranges:  Prepandial:   less than 140 mg/dL      Peak postprandial:   less than 180 mg/dL (1-2 hours)      Critically ill patients:  140 - 180 mg/dL   Lab Results  Component Value Date   GLUCAP 171 (H) 08/08/2018   HGBA1C 8.6 (H) 08/07/2018    Review of Glycemic Control  Diabetes history: DM 2 Outpatient Diabetes medications: Tresiba 42 units, Trulicity 1.5 mg Every Sunday  Current orders for Inpatient glycemic control:  Lantus 42 units qhs Novolog 0-15 units tid Novolog 0-5 units qhs Novolog 6 units tid  Inpatient Diabetes Program Recommendations:    IV Solumedrol transitioned to cortef 15 mg qam and 5 mg qpm. Glucose trends up to 400's last night.  Fasting glucose 297. Glucose trends decreased throughout the day. Noted Novolog meal coverage increased. Agree with adjustments. Glucose trends should decrease significantly.   Will follow.   Thanks,  Tama Headings RN, MSN, BC-ADM Inpatient Diabetes Coordinator Team Pager [phone] (8a-5p)

## 2018-08-08 NOTE — Plan of Care (Signed)
  Problem: Education: Goal: Knowledge of General Education information will improve Description Including pain rating scale, medication(s)/side effects and non-pharmacologic comfort measures Outcome: Progressing   Problem: Clinical Measurements: Goal: Ability to maintain clinical measurements within normal limits will improve Outcome: Progressing Goal: Will remain free from infection Outcome: Progressing Goal: Diagnostic test results will improve Outcome: Progressing Goal: Respiratory complications will improve Outcome: Progressing Goal: Cardiovascular complication will be avoided Outcome: Progressing   Problem: Activity: Goal: Risk for activity intolerance will decrease Outcome: Completed/Met   Problem: Coping: Goal: Level of anxiety will decrease Outcome: Completed/Met   Problem: Elimination: Goal: Will not experience complications related to bowel motility Outcome: Completed/Met Goal: Will not experience complications related to urinary retention Outcome: Completed/Met   Problem: Safety: Goal: Ability to remain free from injury will improve Outcome: Progressing   Problem: Skin Integrity: Goal: Risk for impaired skin integrity will decrease Outcome: Progressing   Problem: Cardiac: Goal: Ability to achieve and maintain adequate cardiopulmonary perfusion will improve Outcome: Progressing

## 2018-08-08 NOTE — TOC Initial Note (Signed)
Transition of Care [facility]) - Initial/Assessment Note    Patient Details  Name: Crystal Yates MRN: [id number] Date of Birth: 05-04-1955  [facility]) CM/SW Contact:    Sherrilyn Rist Phone Number: [phone] 08/08/2018, 9:35 AM  Clinical Narrative:                 Patient lives at home with spouse; PCP: Dortha Kern, [place]; has private insurance with BCBS with prescription drug coverage; pharmacy of choice [facility]; DME - walker, cane and power chair at home; [facility] choice offered, [facility] chose [facility] [place] [place]; Dan RN with [facility] called for arrangements.  Expected Discharge Plan: [place] Barriers to Discharge: No Barriers Identified   Patient Goals and CMS Choice Patient states their goals for this hospitalization and ongoing recovery are:: to stay at home CMS Medicare.gov Compare Post Acute Care list provided to:: Patient Choice offered to / list presented to : Patient  Expected Discharge Plan and Services Expected Discharge Plan: [place] In-house Referral: [facility] Discharge Planning Services: CM Consult Post Acute Care Choice: NA Living arrangements for the past 2 months: Single Family Home                 DME Arranged: N/A DME Agency: NA         [place] Agency: [place] ([place]) Date [place]: 08/08/18 Time [place]: [phone] Representative spoke with at [place]: [place]  Prior Living Arrangements/Services Living arrangements for the past 2 months: [place] with:: Spouse Patient language and need for interpreter reviewed:: Yes Do you feel safe going back to the place where you live?: Yes      Need for Family Participation in Patient Care: No (Comment) Care giver support system in place?: Yes (comment)   Criminal Activity/Legal Involvement Pertinent to Current Situation/Hospitalization: No - Comment as needed  Activities of Daily  Living Home Assistive Devices/Equipment: Walker (specify type), Eyeglasses ADL Screening (condition at time of admission) Patient's cognitive ability adequate to safely complete daily activities?: Yes Is the patient deaf or have difficulty hearing?: No Does the patient have difficulty seeing, even when wearing glasses/contacts?: No Does the patient have difficulty concentrating, remembering, or making decisions?: No Patient able to express need for assistance with ADLs?: Yes Does the patient have difficulty dressing or bathing?: No Independently performs ADLs?: Yes (appropriate for developmental age) Communication: Independent Dressing (OT): Independent Does the patient have difficulty walking or climbing stairs?: Yes Weakness of Legs: Both Weakness of Arms/Hands: None  Permission Sought/Granted Permission sought to share information with : Case Manager Permission granted to share information with : Yes, Verbal Permission Granted  Share Information with NAME: spouse Broadus John  Permission granted to share info w AGENCY: [place] agency        Emotional Assessment Appearance:: Developmentally appropriate Attitude/Demeanor/Rapport: Gracious, Engaged Affect (typically observed): Accepting Orientation: : Oriented to Self, Oriented to  Time, Oriented to Place, Oriented to Situation Alcohol / Substance Use: Not Applicable Psych Involvement: No (comment)  Admission diagnosis:  Shortness of breath [R06.02] COPD exacerbation (HCC) [J44.1] Acute combined systolic and diastolic congestive heart failure ([place]) [I50.41] Patient Active Problem List   Diagnosis Date Noted  . Acute respiratory failure with hypoxia ([place]) 08/06/2018  . Ventral hernia 08/06/2018  . Dyspnea 07/19/2018  . Acute diastolic CHF (congestive heart failure) ([place]) 07/19/2018  . Hypoglycemia 07/10/2018  . Abdominal pain 07/10/2018  . Alcohol use 07/10/2018  .  Type II diabetes mellitus with renal manifestations ([place]) 07/10/2018   . Chronic kidney disease (CKD), stage II (mild) 07/10/2018  . Chronic diastolic CHF (congestive heart failure) ([place]) 07/10/2018  . COPD exacerbation ([place]) 07/10/2018  . Suspected Covid-19 Virus Infection 07/10/2018  . Chronic respiratory failure with hypoxia and hypercapnia ([place]) 02/01/2018  . Cigarette smoker 02/01/2018  . COPD  GOLD III/ c/b hypoxemia hypercarbia  01/31/2018  . Internal and external bleeding hemorrhoids 01/22/2018  . Sessile colonic polyp 01/22/2018  . Diverticulosis of colon without hemorrhage 01/22/2018  . Iron deficiency 01/11/2018  . Acute on chronic respiratory failure with hypoxia and hypercapnia ([place]) 01/04/2018  . Acute respiratory failure with hypercapnia ([place])   . AKI (acute kidney injury) ([place])   . Respiratory crackles at left lung base   . Morbid obesity ([place]) 12/29/2017  . Acute on chronic heart failure ([place]) 12/27/2017  . Hyperglycemia   . Coronary stent restenosis due to progression of disease   . Pseudoaneurysm following procedure ([place])   . Pseudoaneurysm ([place]) 10/17/2017  . S/P angioplasty for ISR of dRCA 10/13/17 10/14/2017  . CAD (coronary artery disease) 10/13/2017  . Right upper lobe pneumonia ([place]) 05/06/2017  . Pneumonia due to respiratory syncytial virus (RSV) 04/21/2017  . Entrapment, radial nerve, right 03/31/2017  . COPD without exacerbation ([place]) 10/14/2016  . Acute on chronic combined systolic and diastolic heart failure ([place]) 10/13/2016  . Chest tightness or pressure 10/12/2016  . Dyspnea on exertion 09/29/2016  . History of coronary artery disease 09/29/2016  . OAB (overactive bladder) 09/21/2016  . Neck pain 09/08/2016  . Paresthesia of arm 09/08/2016  . Flank pain 08/31/2016  . Neuralgia of right upper extremity 04/26/2016  . Coronary artery disease 04/16/2016  . Lymphocele after surgical procedure 04/16/2016  . Ventricular tachycardia (paroxysmal) ([place]) 04/09/2016  . Acute on chronic HFrEF (heart failure with reduced  ejection fraction) ([place]) 04/08/2016  . Ischemic cardiomyopathy 04/08/2016  . Type 2 diabetes mellitus without complication, with long-term current use of insulin ([place]) 04/08/2016  . Anemia, blood loss 09/16/2015  . GI bleeding 09/16/2015  . Chest pain 05/22/2015  . Hypothyroid 05/22/2015  . Status post hernia repair 01/29/2015  . Surgical wound breakdown 01/29/2015  . Hernia with strangulation 01/18/2015  . Tobacco abuse 01/18/2015  . Recurrent incisional hernia with incarceration 01/15/2015  . Sleep apnea 01/15/2015  . S/P drug eluting coronary stent placement 10/16/2014  . Adrenal insufficiency ([place]) 11/21/2013  . Anemia 11/21/2013  . Unstable angina ([place]) 11/21/2013  . Atherosclerotic heart disease of native coronary artery without angina pectoris 11/21/2013  . Cardiomyopathy ([place]) 11/21/2013  . Congestive heart failure ([place]) 11/21/2013  . Diabetes mellitus with no complication ([place]) 84/66/5993  . Edema 11/21/2013  . Empty sella syndrome ([place]) 11/21/2013  . Hepatitis 11/21/2013  . Hyperlipemia 11/21/2013  . Hypertension 11/21/2013  . Kidney cyst, acquired 11/21/2013  . Fat necrosis 11/21/2013  . Pulmonary emphysema ([place]) 11/21/2013  . Sjogrens syndrome ([place]) 11/21/2013  . Thyroid disorder 11/21/2013  . Nicotine dependence, uncomplicated 57/03/7791  . Major depressive disorder, recurrent episode ([place]) 09/17/2013  . Displacement of lumbar intervertebral disc 06/14/2013  . Intractable migraine without aura 06/14/2013  . Mild cognitive impairment 06/14/2013  . Polyneuropathy in diabetes ([place]) 06/14/2013  . Secondary adrenal insufficiency ([place]) 04/12/2011  . DDD (degenerative disc disease), lumbosacral 01/11/2008  . Thoracic or lumbosacral neuritis or radiculitis 01/11/2008   PCP:  Dortha Kern, [place] Pharmacy:   [facility] [place], [place] - [phone]  [address] [place] [address] Phone: [phone] Fax:  [phone]     Social Determinants of Health (SDOH) Interventions    Readmission Risk Interventions Readmission Risk Prevention Plan 07/20/2018  Transportation Screening Complete  Medication Review Press photographer) Complete  Palliative Care Screening Not [place] Not Applicable

## 2018-08-08 NOTE — Progress Notes (Signed)
PROGRESS NOTE  Crystal Yates FXT:[id number] DOB: [birth year] DOA: 08/06/2018 PCP: Dortha Kern, PA  HPI/Recap of past 24 hours:   Crystal Yates is a 63 y.o. female with medical history significant of combined systolic and diastolic CHF, HTN, HLD, COPD oxygen dependent on 2 L, CAD s/p stent, adrenal insufficiency, DM type II, CKD, obesity, and tobacco abuse; who presents with complaints of swelling in her legs and shortness of breath.  Admitted for acute on chronic hypoxic respiratory failure secondary to COPD exacerbation.  08/08/18: Patient seen and examined at her bedside.  Reports breathing and wheezing are improving with IV Solu-Medrol.  She denies any chest pain.  Assessment/Plan: Principal Problem:   Acute on chronic respiratory failure with hypoxia and hypercapnia (HCC) Active Problems:   Adrenal insufficiency (HCC)   Diabetes mellitus with no complication (HCC)   Hyperlipemia   Acute on chronic combined systolic and diastolic heart failure (HCC)   CAD (coronary artery disease)   Morbid obesity (HCC)   Cigarette smoker   COPD exacerbation (HCC)   Acute respiratory failure with hypoxia (HCC)   Ventral hernia  Acute on chronic hypoxic respiratory failure secondary to acute COPD exacerbation Ongoing current smoker Bedside counseling on tobacco cessation Completed 3 days of IV Solu-Medrol Maintain O2 saturation greater than 90% On 2 L of oxygen by nasal cannula at baseline Wean oxygen back to baseline Continue p.o. azithromycin for acute COPD exacerbation  AKI on CKD 3 Baseline creatinine 1.2 Creatinine today 1.51 with GFR of 36 Avoid nephrotoxic agent Monitor urine output Repeat BMP in the morning  Combined systolic and diastolic CHF Last 2D echo revealed LVEF 40 to 45% with grade 1 diastolic dysfunction on 2/99/2426 Received IV Lasix Continue strict I's and O's and daily weight Continue cardiac medications Follow-up with cardiology outpatient   Hypothyroidism Continue levothyroxine  Type 2 diabetes with hyperglycemia Exacerbated by IV steroids Hemoglobin A1c 8.6 Increased insulin doses  Ongoing tobacco use Tobacco cessation counseling at bedside   Code Status: full Family Communication: discussed plan with patient Disposition Plan:  Possible discharge to home tomorrow 08/09/2018   Objective: Vitals:   08/08/18 0003 08/08/18 0546 08/08/18 0819 08/08/18 1158  BP: 127/67 136/72  117/72  Pulse:  98  91  Resp:  18  18  Temp:  97.8 F (36.6 C)  97.6 F (36.4 C)  TempSrc:  Oral  Oral  SpO2:  92% 95% 94%  Weight:  101 kg    Height:        Intake/Output Summary (Last 24 hours) at 08/08/2018 1300 Last data filed at 08/08/2018 1027 Gross per 24 hour  Intake 533 ml  Output 1551 ml  Net -1018 ml   Filed Weights   08/06/18 0923 08/07/18 0510 08/08/18 0546  Weight: 101.5 kg 100.4 kg 101 kg    Exam:  . General: 63 y.o. year-old female well developed well nourished in no acute distress.  Alert and oriented x3. . Cardiovascular: Regular rate and rhythm with no rubs or gallops.  No thyromegaly or JVD noted.   Marland Kitchen Respiratory: Mild rales at bases with no wheezes.  Good inspiratory effort.   . Abdomen: Soft nontender nondistended with normal bowel sounds x4 quadrants. . Musculoskeletal: Trace lower extremity edema. 2/4 pulses in all 4 extremities. Marland Kitchen Psychiatry: Mood is appropriate for condition and setting   Data Reviewed: CBC: Recent Labs  Lab 08/06/18 0331 08/06/18 0539 08/07/18 0531 08/08/18 0418  WBC 10.8*  --  9.1 15.9*  NEUTROABS  7.6  --  8.5*  --   HGB 13.3 12.6 12.9 12.2  HCT 41.4 37.0 40.3 37.0  MCV 93.2  --  93.1 91.4  PLT 197  --  170 295   Basic Metabolic Panel: Recent Labs  Lab 08/06/18 0331 08/06/18 0539 08/06/18 0751 08/07/18 0531 08/08/18 0418  NA 141 142  --  138 140  K 3.7 3.1*  --  4.1 3.7  CL 98  --   --  94* 97*  CO2 32  --   --  28 30  GLUCOSE 162*  --   --  418* 346*  BUN 26*   --   --  29* 40*  CREATININE 1.26*  --   --  1.39* 1.51*  CALCIUM 9.4  --   --  9.0 8.8*  MG  --   --  1.8  --   --    GFR: Estimated Creatinine Clearance: 42.4 mL/min (A) (by C-G formula based on SCr of 1.51 mg/dL (H)). Liver Function Tests: No results for input(s): AST, ALT, ALKPHOS, BILITOT, PROT, ALBUMIN in the last 168 hours. No results for input(s): LIPASE, AMYLASE in the last 168 hours. No results for input(s): AMMONIA in the last 168 hours. Coagulation Profile: No results for input(s): INR, PROTIME in the last 168 hours. Cardiac Enzymes: Recent Labs  Lab 08/06/18 0331 08/06/18 0751 08/06/18 1320 08/06/18 1903  TROPONINI 0.04* 0.04* 0.03* 0.03*   BNP (last 3 results) Recent Labs    01/31/18 1230 03/15/18 1025  PROBNP 1,553* 858*   HbA1C: Recent Labs    08/07/18 0531  HGBA1C 8.6*   CBG: Recent Labs  Lab 08/07/18 1142 08/07/18 1612 08/07/18 2135 08/08/18 0601 08/08/18 1145  GLUCAP 366* 346* 489* 297* 220*   Lipid Profile: No results for input(s): CHOL, HDL, LDLCALC, TRIG, CHOLHDL, LDLDIRECT in the last 72 hours. Thyroid Function Tests: Recent Labs    08/06/18 0751  TSH 1.316   Anemia Panel: No results for input(s): VITAMINB12, FOLATE, FERRITIN, TIBC, IRON, RETICCTPCT in the last 72 hours. Urine analysis:    Component Value Date/Time   COLORURINE YELLOW 08/06/2018 0843   APPEARANCEUR HAZY (A) 08/06/2018 0843   LABSPEC 1.009 08/06/2018 0843   PHURINE 7.0 08/06/2018 0843   GLUCOSEU NEGATIVE 08/06/2018 0843   HGBUR SMALL (A) 08/06/2018 0843   BILIRUBINUR NEGATIVE 08/06/2018 0843   KETONESUR NEGATIVE 08/06/2018 0843   PROTEINUR NEGATIVE 08/06/2018 0843   NITRITE POSITIVE (A) 08/06/2018 0843   LEUKOCYTESUR SMALL (A) 08/06/2018 0843   Sepsis Labs: @LABRCNTIP (procalcitonin:4,lacticidven:4)  ) Recent Results (from the past 240 hour(s))  SARS Coronavirus 2 (CEPHEID - Performed in [place] hospital lab), Hosp Order     Status: None    Collection Time: 08/06/18  3:36 AM  Result Value Ref Range Status   SARS Coronavirus 2 NEGATIVE NEGATIVE Final    Comment: (NOTE) If result is NEGATIVE SARS-CoV-2 target nucleic acids are NOT DETECTED. The SARS-CoV-2 RNA is generally detectable in upper and lower  respiratory specimens during the acute phase of infection. The lowest  concentration of SARS-CoV-2 viral copies this assay can detect is 250  copies / mL. A negative result does not preclude SARS-CoV-2 infection  and should not be used as the sole basis for treatment or other  patient management decisions.  A negative result may occur with  improper specimen collection / handling, submission of specimen other  than nasopharyngeal swab, presence of viral mutation(s) within the  areas targeted by this assay,  and inadequate number of viral copies  (<250 copies / mL). A negative result must be combined with clinical  observations, patient history, and epidemiological information. If result is POSITIVE SARS-CoV-2 target nucleic acids are DETECTED. The SARS-CoV-2 RNA is generally detectable in upper and lower  respiratory specimens dur ing the acute phase of infection.  Positive  results are indicative of active infection with SARS-CoV-2.  Clinical  correlation with patient history and other diagnostic information is  necessary to determine patient infection status.  Positive results do  not rule out bacterial infection or co-infection with other viruses. If result is PRESUMPTIVE POSTIVE SARS-CoV-2 nucleic acids MAY BE PRESENT.   A presumptive positive result was obtained on the submitted specimen  and confirmed on repeat testing.  While 2019 novel coronavirus  (SARS-CoV-2) nucleic acids may be present in the submitted sample  additional confirmatory testing may be necessary for epidemiological  and / or clinical management purposes  to differentiate between  SARS-CoV-2 and other Sarbecovirus currently known to infect humans.   If clinically indicated additional testing with an alternate test  methodology [phone]) is advised. The SARS-CoV-2 RNA is generally  detectable in upper and lower respiratory sp ecimens during the acute  phase of infection. The expected result is Negative. Fact Sheet for Patients:  StrictlyIdeas.no Fact Sheet for Healthcare Providers: BankingDealers.co.za This test is not yet approved or cleared by the [place] FDA and has been authorized for detection and/or diagnosis of SARS-CoV-2 by FDA under an Emergency Use Authorization (EUA).  This EUA will remain in effect (meaning this test can be used) for the duration of the COVID-19 declaration under Section 564(b)(1) of the Act, 21 U.S.C. section 360bbb-3(b)(1), unless the authorization is terminated or revoked sooner. Performed at [facility], [place] [address] [place], [place] [address]       Studies: No results found.  Scheduled Meds: . aspirin EC  81 mg Oral Daily  . atorvastatin  20 mg Oral QPM  . azithromycin  250 mg Oral Daily  . clopidogrel  75 mg Oral QHS  . DULoxetine  60 mg Oral Daily  . enoxaparin (LOVENOX) injection  40 mg Subcutaneous Daily  . ferrous [id number] C-folic acid  1 capsule Oral QHS  . guaiFENesin  600 mg Oral BID  . hydrocortisone  5-15 mg Oral See admin instructions  . insulin aspart  0-15 Units Subcutaneous TID WC  . insulin aspart  0-5 Units Subcutaneous QHS  . insulin aspart  6 Units Subcutaneous TID WC  . insulin glargine  42 Units Subcutaneous QHS  . ipratropium-albuterol  3 mL Nebulization TID  . levothyroxine  150 mcg Oral QAC breakfast  . nicotine  21 mg Transdermal Daily  . potassium chloride SA  20 mEq Oral Daily  . sodium chloride flush  3 mL Intravenous Q12H  . torsemide  80 mg Oral Daily    Continuous Infusions: . sodium chloride       LOS: 1 day     Kayleen Memos, MD [facility] Pager [phone]   If 7PM-7AM, please contact night-coverage [email or web address] Password [email or web address] 08/08/2018, 1:00 PM

## 2018-08-08 NOTE — Progress Notes (Signed)
Patient chose [facility] [place]) [place]; referral made as requestedAneta Mins [phone]  [place]  [phone]   [place] to my Favorites Quality of Patient Care Rating3 out of 5 stars Patient Survey Summary Rating4 out of [place]  [phone]   [place] to my Favorites Quality of Patient Care Rating4 out of 5 stars Patient Survey Summary Rating4 out of [place]  [phone]   [place] to my Favorites Quality of Patient Care Rating4 out of 5 stars Patient Survey Summary Rating4 out of 5 stars  [place]  [phone]   [place] to my Favorites Quality of Patient Care Rating4 out of 5 stars Patient Survey Summary Rating4 out of [place]  [phone]   [place] to my [place] out of 5 stars Patient Survey Summary Rating4 out of [place]  [phone]   [place] to my Favorites Quality of Patient Care Rating3 out of 5 stars Patient Survey Summary [place] out of 5 stars  [facility]  [phone]   Add [facility] to my Favorites Quality of Patient Care Rating4 out of [place]  [phone]   [place] to my Favorites Quality of Patient [place] out of 5 stars Patient Survey Summary Rating4 out of 5 stars  [facility]  [phone]   Add [facility] to my Favorites Quality of Patient Care Rating3  out of 5 stars Patient Survey Summary Rating3 out of [place]  [phone]   [place] to my Favorites Quality of Patient Care Rating4 out of 5 stars Patient Survey Summary Rating5 out of [place]  [phone]   [place] to my [place]  out of 5 stars Patient Survey Summary Rating3 out of [place]  [phone]

## 2018-08-09 LAB — BASIC METABOLIC PANEL
Anion gap: 12 (ref 5–15)
BUN: 43 mg/dL — ABNORMAL HIGH (ref 8–23)
CO2: 33 mmol/L — ABNORMAL HIGH (ref 22–32)
Calcium: 8.9 mg/dL (ref 8.9–10.3)
Chloride: 99 mmol/L (ref 98–111)
Creatinine, Ser: 1.46 mg/dL — ABNORMAL HIGH (ref 0.44–1.00)
GFR calc Af Amer: 44 mL/min — ABNORMAL LOW (ref >60)
GFR calc non Af Amer: 38 mL/min — ABNORMAL LOW (ref >60)
Glucose, Bld: 127 mg/dL — ABNORMAL HIGH (ref 70–99)
Potassium: 3.5 mmol/L (ref 3.5–5.1)
Sodium: 144 mmol/L (ref 135–145)

## 2018-08-09 LAB — GLUCOSE, CAPILLARY
Glucose-Capillary: 105 mg/dL — ABNORMAL HIGH (ref 70–99)
Glucose-Capillary: 76 mg/dL (ref 70–99)

## 2018-08-09 MED ORDER — AZITHROMYCIN 250 MG PO TABS: ORAL_TABLET | ORAL | 0 refills | Status: DC

## 2018-08-09 MED ORDER — NICOTINE 21 MG/24HR TD PT24: 21.0000 mg | MEDICATED_PATCH | Freq: Every day | TRANSDERMAL | 0 refills | Status: DC

## 2018-08-09 MED ORDER — ASPIRIN 81 MG PO TBEC: 81.0000 mg | DELAYED_RELEASE_TABLET | Freq: Every day | ORAL | 0 refills | Status: AC

## 2018-08-09 NOTE — Discharge Summary (Addendum)
Discharge Summary  Crystal Yates TSV:[id number] DOB: March 10, 1956  PCP: Dortha Kern, PA  Admit date: 08/06/2018 Discharge date: 08/09/2018  Time spent: 35 minutes  Recommendations for Outpatient Follow-up:  1. Follow-up with your PCP 2. Follow-up with your cardiologist 3. Take your medications as prescribed 4. Continue physical therapy 5. Fall precautions 6. Abstain from tobacco use  Discharge Diagnoses:  Active Hospital Problems   Diagnosis Date Noted   Acute on chronic respiratory failure with hypoxia and hypercapnia (HCC) 01/04/2018   Ventral hernia 08/06/2018   Acute respiratory failure with hypoxia ([place]) 08/06/2018   COPD exacerbation ([place]) 07/10/2018   Cigarette smoker 02/01/2018   Morbid obesity ([place]) 12/29/2017   CAD (coronary artery disease) 10/13/2017   Acute on chronic combined systolic and diastolic heart failure ([place]) 10/13/2016   Diabetes mellitus with no complication ([place]) 92/33/0076   Hyperlipemia 11/21/2013   Adrenal insufficiency ([place]) 11/21/2013    Resolved Hospital Problems  No resolved problems to display.    Discharge Condition: Stable  Diet recommendation: Resume previous diet  Vitals:   08/09/18 0753 08/09/18 0759  BP:    Pulse:  93  Resp:    Temp:    SpO2: 97%     History of present illness:  [name] a 63 y.o.femalewith medical history significant ofcombined systolic and diastolic CHF, HTN, HLD, COPD oxygen dependent on 2 L, CADs/pstent, adrenal insufficiency, DM type II, CKD, obesity, and tobacco abuse; who presents with complaints of swelling in her legs and shortness of breath.  Admitted for acute on chronic hypoxic and hypercarbic respiratory failure secondary to acute COPD exacerbation.  08/09/18: Patient seen and examined at her bedside.  No acute events overnight.  She states she feels better.  Vital signs and labs reviewed and are stable.  On the day of discharge, the patient was  hemodynamically stable.  She will need to follow-up with her primary care provider and cardiologist posthospitalization.  She will also need to abstain from tobacco use and take her medications as prescribed.     Hospital Course:  Principal Problem:   Acute on chronic respiratory failure with hypoxia and hypercapnia (HCC) Active Problems:   Adrenal insufficiency (HCC)   Diabetes mellitus with no complication (HCC)   Hyperlipemia   Acute on chronic combined systolic and diastolic heart failure (HCC)   CAD (coronary artery disease)   Morbid obesity (HCC)   Cigarette smoker   COPD exacerbation (HCC)   Acute respiratory failure with hypoxia (HCC)   Ventral hernia  Acute on chronic hypoxic hypercarbic respiratory failure secondary to acute COPD exacerbation Ongoing current smoker Bedside counseling on tobacco cessation Completed 3 days of IV Solu-Medrol O2 saturation 97% on 3 L nasal cannula Maintain O2 saturation greater than 90% On 2 L of oxygen by nasal cannula at baseline Continue p.o. azithromycin x4 days for acute COPD exacerbation Follow-up with your PCP  Resolving AKI on CKD 3 Baseline creatinine 1.2 Creatinine today 1.46 from 1.51 with GFR of 36 Avoid nephrotoxic agent Follow-up with your PCP  Combined chronic diastolic and systolic CHF Last 2D echo revealed LVEF 40 to 45% with grade 1 diastolic dysfunction on 05/23/3333 Continue strict I's and O's and daily weight Continue cardiac medications On torsemide Follow-up with cardiology outpatient  Adrenal insufficiency Resume Cortef  Hypothyroidism Continue levothyroxine  Type 2 diabetes with hyperglycemia Exacerbated by IV steroids Hemoglobin A1c 8.6 Resume home medications Follow-up with your PCP  Ongoing tobacco use Tobacco cessation counseling at bedside   Code  Status:full    Discharge Exam: BP (!) 145/75 (BP Location: Left Arm)    Pulse 93    Temp 98.4 F (36.9 C) (Oral)    Resp 18    Ht  '5\' 2"'  (1.575 m)    Wt 101 kg    SpO2 97%    BMI 40.73 kg/m   General: 63 y.o. year-old female well developed well nourished in no acute distress.  Alert and oriented x3.  Cardiovascular: Regular rate and rhythm with no rubs or gallops.  No thyromegaly or JVD noted.    Respiratory: Clear to auscultation with no wheezes or rales. Good inspiratory effort.  Abdomen: Soft nontender nondistended with normal bowel sounds x4 quadrants.  Musculoskeletal: No lower extremity edema. 2/4 pulses in all 4 extremities.  Psychiatry: Mood is appropriate for condition and setting  Discharge Instructions You were cared for by a hospitalist during your hospital stay. If you have any questions about your discharge medications or the care you received while you were in the hospital after you are discharged, you can call the unit and asked to speak with the hospitalist on call if the hospitalist that took care of you is not available. Once you are discharged, your primary care physician will handle any further medical issues. Please note that NO REFILLS for any discharge medications will be authorized once you are discharged, as it is imperative that you return to your primary care physician (or establish a relationship with a primary care physician if you do not have one) for your aftercare needs so that they can reassess your need for medications and monitor your lab values.   Allergies as of 08/09/2018      Reactions   Hydroxychloroquine Shortness Of Breath, Nausea Only, Other (See Comments)   Dizziness   Donepezil Other (See Comments)   Dizziness, depression, and makes the patient feel "funny"   Prednisone Other (See Comments)   Causes depression and suicidal thoughts   Anticoagulant Cit Dext [acd Formula A] Other (See Comments)   Unknown   Bupropion Other (See Comments)   Suicidal thoughts   Metrizamide Other (See Comments)   (a non-ionic radiopaque contrast agent) "Blows the vein" and contrast gathers  at the injected site's limb   Varenicline Other (See Comments)   Suicidal thoughts   Tape Rash, Other (See Comments)   Paper tape is preferred, PLEASE      Medication List    TAKE these medications   acetaminophen 500 MG tablet Commonly known as:  TYLENOL Take 1 tablet (500 mg total) by mouth every 6 (six) hours as needed for mild pain.   albuterol 108 (90 Base) MCG/ACT inhaler Commonly known as:  ProAir HFA Inhale 2 puffs into the lungs every 4-6 hours as needed for shortness of breath or wheezing   albuterol (2.5 MG/3ML) 0.083% nebulizer solution Commonly known as:  PROVENTIL Take 3 mLs (2.5 mg total) by nebulization every 6 (six) hours as needed for wheezing or shortness of breath.   aspirin 81 MG EC tablet Take 1 tablet (81 mg total) by mouth daily. Start taking on:  Aug 10, 2018   atorvastatin 20 MG tablet Commonly known as:  LIPITOR Take 20 mg by mouth every evening.   azithromycin 250 MG tablet Commonly known as:  ZITHROMAX Take 1 tablet 250 mg daily x4 days Start taking on:  Aug 10, 2018   CALCIUM PO Take 1 tablet by mouth daily.   clopidogrel 75 MG tablet Commonly known as:  PLAVIX Take 1 tablet (75 mg total) by mouth daily. What changed:  when to take this   DULoxetine 60 MG capsule Commonly known as:  CYMBALTA Take 60 mg by mouth daily.   Emgality 120 MG/ML Sosy Generic drug:  Galcanezumab-gnlm Inject 120 mg into the skin every 30 (thirty) days.   Fusion Plus Caps Take 1 capsule by mouth at bedtime.   hydrocortisone 2.5 % rectal cream Commonly known as:  ANUSOL-HC Place rectally 2 (two) times daily. What changed:    how much to take  when to take this  reasons to take this   hydrocortisone 5 MG tablet Commonly known as:  CORTEF Take 5-15 mg by mouth See admin instructions. Take 15 mg in the morning and 5 mg in the evening   hydrOXYzine 25 MG tablet Commonly known as:  ATARAX/VISTARIL Take 1 tablet by mouth 2 (two) times daily.     levothyroxine 150 MCG tablet Commonly known as:  SYNTHROID Take 150 mcg by mouth daily before breakfast.   MAGnesium-Oxide 400 (241.3 Mg) MG tablet Generic drug:  magnesium oxide Take 400 mg by mouth daily.   nicotine 21 mg/24hr patch Commonly known as:  NICODERM CQ - dosed in mg/24 hours Place 1 patch (21 mg total) onto the skin daily. Start taking on:  Aug 10, 2018   nitroGLYCERIN 0.4 MG SL tablet Commonly known as:  NITROSTAT Place 1 tablet (0.4 mg total) under the tongue every 5 (five) minutes as needed for chest pain.   OXYGEN Place 2 L into the nose continuous.   polyethylene glycol 17 g packet Commonly known as:  MIRALAX / GLYCOLAX Take 17 g by mouth daily. What changed:    when to take this  reasons to take this   potassium chloride SA 20 MEQ tablet Commonly known as:  K-DUR Take 1 tablet (20 mEq total) by mouth daily. Take when you take Torsemide.   Tiotropium Bromide-Olodaterol 2.5-2.5 MCG/ACT Aers Commonly known as:  Stiolto Respimat Inhale 2 puffs into the lungs daily.   torsemide 20 MG tablet Commonly known as:  DEMADEX Take 4 tablets (80 mg total) by mouth daily for 30 days.   Tyler Aas FlexTouch 100 UNIT/ML Sopn FlexTouch Pen Generic drug:  insulin degludec Inject 42 Units into the skin at bedtime.   Trulicity 1.5 GP/4.9IY Sopn Generic drug:  Dulaglutide Inject 1.5 mg into the skin every Sunday.   Vitamin D3 50 MCG (2000 UT) capsule Take 2,000 Units by mouth daily.      Allergies  Allergen Reactions   Hydroxychloroquine Shortness Of Breath, Nausea Only and Other (See Comments)    Dizziness   Donepezil Other (See Comments)    Dizziness, depression, and makes the patient feel "funny"   Prednisone Other (See Comments)    Causes depression and suicidal thoughts   Anticoagulant Cit Dext [Acd Formula A] Other (See Comments)    Unknown   Bupropion Other (See Comments)    Suicidal thoughts   Metrizamide Other (See Comments)    (a  non-ionic radiopaque contrast agent) "Blows the vein" and contrast gathers at the injected site's limb   Varenicline Other (See Comments)    Suicidal thoughts   Tape Rash and Other (See Comments)    Paper tape is preferred, PLEASE   Follow-up [place] [facility].   Specialty:  Home Health Services Why:  They will do your home health care at your home  Dortha Kern, PA. Call in 1 day(s).   Specialty:  Physician Assistant Why:  Please call for a post hospital follow-up appointment Contact information: [address]  [address] [place] [address] [phone]        Park Liter, MD .   Specialty:  Cardiology Contact information: [place]  [address] [phone]            The results of significant diagnostics from this hospitalization (including imaging, microbiology, ancillary and laboratory) are listed below for reference.    Significant Diagnostic Studies: Ct Abdomen Pelvis Wo Contrast  Result Date: 07/11/2018 CLINICAL DATA:  Abdominal pain EXAM: CT ABDOMEN AND PELVIS WITHOUT CONTRAST TECHNIQUE: Multidetector CT imaging of the abdomen and pelvis was performed following the standard protocol without IV contrast. COMPARISON:  CT abdomen pelvis 05/13/2018 FINDINGS: LOWER CHEST: 6 mm nodular opacity at the left lung base. Bibasilar atelectasis. HEPATOBILIARY: The hepatic contours and density are normal. There is no intra- or extrahepatic biliary dilatation. Status post cholecystectomy. PANCREAS: The pancreatic parenchymal contours are normal and there is no ductal dilatation. There is no peripancreatic fluid collection. SPLEEN: Normal. ADRENALS/URINARY TRACT: --Adrenal glands: Normal. --Right kidney/ureter: No hydronephrosis, nephroureterolithiasis, perinephric stranding or solid renal mass. --Left kidney/ureter: Single nonobstructing renal calculus measuring 4 mm. No hydronephrosis, perinephric  stranding or solid renal mass. --Urinary bladder: Normal for degree of distention STOMACH/BOWEL: --Stomach/Duodenum: There is no hiatal hernia or other gastric abnormality. The duodenal course and caliber are normal. --Small bowel: There is a ventral abdominal hernia that contains a small loop of unobstructed small bowel. --Colon: No focal abnormality. --Appendix: Not visualized. No right lower quadrant inflammation or free fluid. VASCULAR/LYMPHATIC: There is aortic atherosclerosis without hemodynamically significant stenosis. No abdominal or pelvic lymphadenopathy. REPRODUCTIVE: Normal uterus and ovaries. MUSCULOSKELETAL. No bony spinal canal stenosis or focal osseous abnormality. OTHER: No radiopaque hernia repair mesh is visualized. IMPRESSION: 1. No acute abnormality of the abdomen or pelvis. 2. Nonobstructive left nephrolithiasis measuring 4 mm. 3. Ventral abdominal hernia containing a small loop of unobstructed small bowel, unchanged. No radiopaque hernia repair mesh. 4. 6 mm nodular opacity at the left lung base, not visible previously. Non-contrast chest CT at 6-12 months is recommended. If the nodule is stable at time of repeat CT, then future CT at 18-24 months (from today's scan) is considered optional for low-risk patients, but is recommended for high-risk patients. This recommendation follows the consensus statement: Guidelines for Management of Incidental Pulmonary Nodules Detected on CT Images: From the Fleischner Society 2017; Radiology 2017; 284:228-243. Electronically Signed   By: Ulyses Jarred M.D.   On: 07/11/2018 16:37   Dg Chest Portable 1 View  Result Date: 08/06/2018 CLINICAL DATA:  64 year old female with shortness of breath and chest pain. COVID-19 test today pending, negative on 07/10/2018. EXAM: PORTABLE CHEST 1 VIEW COMPARISON:  07/19/2018 and earlier. FINDINGS: Portable AP semi upright view at 0336 hours. Stable lung volumes. Normal cardiac size and mediastinal contours. Visualized  tracheal air column is within normal limits. No pneumothorax. Pulmonary vascularity appears stable and within normal limits. Basilar predominant increased interstitial markings appear chronic and stable. Similar chronic blunting of the left costophrenic angle. No acute pulmonary opacity. Prior ACDF. Stable visualized osseous structures. IMPRESSION: No acute cardiopulmonary abnormality. Electronically Signed   By: Genevie Ann M.D.   On: 08/06/2018 04:22   Dg Chest Portable 1 View  Result Date: 07/19/2018 CLINICAL DATA:  63 year old female with shortness of breath EXAM: PORTABLE CHEST 1 VIEW COMPARISON:  07/10/2018 FINDINGS: Cardiomediastinal silhouette unchanged in size and contour. Calcifications of the aortic arch. No pneumothorax. No pleural effusion. No confluent airspace disease. Changes of prior PTC I. Surgical changes of the cervical region. No displaced fracture IMPRESSION: Negative for acute cardiopulmonary disease Electronically Signed   By: Corrie Mckusick D.O.   On: 07/19/2018 20:27   Vas [place] Lower Extremity Venous (dvt)  Result Date: 07/20/2018  Lower Venous Study Indications: Edema.  Performing Technologist: Abram Sander RVS  Examination Guidelines: A complete evaluation includes B-mode imaging, spectral Doppler, color Doppler, and power Doppler as needed of all accessible portions of each vessel. Bilateral testing is considered an integral part of a complete examination. Limited examinations for reoccurring indications may be performed as noted.  +-----+---------------+---------+-----------+----------+-------+  RIGHT Compressibility Phasicity Spontaneity Properties Summary  +-----+---------------+---------+-----------+----------+-------+  CFV   Full            Yes       Yes                             +-----+---------------+---------+-----------+----------+-------+   +---------+---------------+---------+-----------+----------+--------------+  LEFT       Compressibility Phasicity Spontaneity Properties Summary         +---------+---------------+---------+-----------+----------+--------------+  CFV       Full            Yes       Yes                                    +---------+---------------+---------+-----------+----------+--------------+  SFJ       Full                                                             +---------+---------------+---------+-----------+----------+--------------+  FV Prox   Full                                                             +---------+---------------+---------+-----------+----------+--------------+  FV Mid    Full                                                             +---------+---------------+---------+-----------+----------+--------------+  FV Distal Full                                                             +---------+---------------+---------+-----------+----------+--------------+  PFV       Full                                                             +---------+---------------+---------+-----------+----------+--------------+  POP       Full            Yes       Yes                                    +---------+---------------+---------+-----------+----------+--------------+  PTV       Full                                                             +---------+---------------+---------+-----------+----------+--------------+  PERO                                                       Not visualized  +---------+---------------+---------+-----------+----------+--------------+     Summary: Right: No evidence of common femoral vein obstruction. Left: There is no evidence of deep vein thrombosis in the lower extremity. However, portions of this examination were limited- see technologist comments above. No cystic structure found in the popliteal fossa.  *See table(s) above for measurements and observations. Electronically signed by Monica Martinez MD on 07/20/2018 at 5:01:05 PM.    Final      Microbiology: Recent Results (from the past 240 hour(s))  SARS Coronavirus 2 (CEPHEID - Performed in [facility] hospital lab), Hosp Order     Status: None   Collection Time: 08/06/18  3:36 AM  Result Value Ref Range Status   SARS Coronavirus 2 NEGATIVE NEGATIVE Final    Comment: (NOTE) If result is NEGATIVE SARS-CoV-2 target nucleic acids are NOT DETECTED. The SARS-CoV-2 RNA is generally detectable in upper and lower  respiratory specimens during the acute phase of infection. The lowest  concentration of SARS-CoV-2 viral copies this assay can detect is 250  copies / mL. A negative result does not preclude SARS-CoV-2 infection  and should not be used as the sole basis for treatment or other  patient management decisions.  A negative result may occur with  improper specimen collection / handling, submission of specimen other  than nasopharyngeal swab, presence of viral mutation(s) within the  areas targeted by this assay, and inadequate number of viral copies  (<250 copies / mL). A negative result must be combined with clinical  observations, patient history, and epidemiological information. If result is POSITIVE SARS-CoV-2 target nucleic acids are DETECTED. The SARS-CoV-2 RNA is generally detectable in upper and lower  respiratory specimens dur ing the acute phase of infection.  Positive  results are indicative of active infection with SARS-CoV-2.  Clinical  correlation with patient history and other diagnostic information is  necessary to determine patient infection status.  Positive results do  not rule out bacterial infection or co-infection with other viruses. If result is PRESUMPTIVE POSTIVE SARS-CoV-2 nucleic acids MAY BE PRESENT.   A presumptive positive result was obtained on the submitted specimen  and confirmed on repeat testing.  While 2019 novel coronavirus  (SARS-CoV-2) nucleic acids may be present in the submitted sample  additional confirmatory testing may be  necessary for epidemiological  and / or clinical management purposes  to differentiate between  SARS-CoV-2 and other Sarbecovirus  currently known to infect humans.  If clinically indicated additional testing with an alternate test  methodology [id number]) is advised. The SARS-CoV-2 RNA is generally  detectable in upper and lower respiratory sp ecimens during the acute  phase of infection. The expected result is Negative. Fact Sheet for Patients:  StrictlyIdeas.no Fact Sheet for Healthcare Providers: BankingDealers.co.za This test is not yet approved or cleared by the [place] FDA and has been authorized for detection and/or diagnosis of SARS-CoV-2 by FDA under an Emergency Use Authorization (EUA).  This EUA will remain in effect (meaning this test can be used) for the duration of the COVID-19 declaration under Section 564(b)(1) of the Act, 21 U.S.C. section 360bbb-3(b)(1), unless the authorization is terminated or revoked sooner. Performed at [facility], [place] [address] [place], [place] [address]      Labs: Basic Metabolic Panel: Recent Labs  Lab 08/06/18 0331 08/06/18 0539 08/06/18 0751 08/07/18 0531 08/08/18 0418 08/09/18 0512  NA 141 142  --  138 140 144  K 3.7 3.1*  --  4.1 3.7 3.5  CL 98  --   --  94* 97* 99  CO2 32  --   --  28 30 33*  GLUCOSE 162*  --   --  418* 346* 127*  BUN 26*  --   --  29* 40* 43*  CREATININE 1.26*  --   --  1.39* 1.51* 1.46*  CALCIUM 9.4  --   --  9.0 8.8* 8.9  MG  --   --  1.8  --   --   --    Liver Function Tests: No results for input(s): AST, ALT, ALKPHOS, BILITOT, PROT, ALBUMIN in the last 168 hours. No results for input(s): LIPASE, AMYLASE in the last 168 hours. No results for input(s): AMMONIA in the last 168 hours. CBC: Recent Labs  Lab 08/06/18 0331 08/06/18 0539 08/07/18 0531 08/08/18 0418  WBC 10.8*  --  9.1 15.9*  NEUTROABS 7.6  --  8.5*  --   HGB 13.3 12.6 12.9  12.2  HCT 41.4 37.0 40.3 37.0  MCV 93.2  --  93.1 91.4  PLT 197  --  170 198   Cardiac Enzymes: Recent Labs  Lab 08/06/18 0331 08/06/18 0751 08/06/18 1320 08/06/18 1903  TROPONINI 0.04* 0.04* 0.03* 0.03*   BNP: BNP (last 3 results) Recent Labs    07/10/18 0206 07/19/18 2058 08/06/18 0331  BNP 202.8* 671.6* 160.3*    ProBNP (last 3 results) Recent Labs    01/31/18 1230 03/15/18 1025  PROBNP 1,553* 858*    CBG: Recent Labs  Lab 08/08/18 0601 08/08/18 1145 08/08/18 1612 08/08/18 2113 08/09/18 0636  GLUCAP 297* 220* 171* 156* 105*       Signed:  Kayleen Memos, MD [facility] 08/09/2018, 12:06 PM

## 2018-08-09 NOTE — Progress Notes (Signed)
Physical Therapy Treatment Patient Details Name: Crystal Yates MRN: [id number] DOB: 08-Oct-1955 Today's Date: 08/09/2018    History of Present Illness Patient is a 63 yo female who presents for Acute respiratory failure with hypoxia secondary to COPD exacerbation: Patient presents with progressively worsening her shortness of breath. Pmh CAD, CHF, DM, HTN, obesity, COPD, CKD and sx hx of lapy for SBO.    PT Comments    Pt admitted with above diagnosis. Pt currently with functional limitations due to the deficits listed below (see PT Problem List). Pt was able to ambulate with RW in hallway and incr distance with sats on 3L O2 at 94% and >.  Pt progressing well and d/c home today.   Pt will benefit from skilled PT to increase their independence and safety with mobility to allow discharge to the venue listed below.     Follow Up Recommendations  Home health PT;Supervision for mobility/OOB     Equipment Recommendations  None recommended by PT    Recommendations for Other Services       Precautions / Restrictions Precautions Precautions: Fall Precaution Comments: O2 dependent 2 liters Restrictions Weight Bearing Restrictions: No    Mobility  Bed Mobility Overal bed mobility: Modified Independent             General bed mobility comments: increased time and effort with rail and HOb elevated, but no physical assit required  Transfers Overall transfer level: Needs assistance Equipment used: 4-wheeled walker Transfers: Sit to/from [facility] Sit to Stand: Supervision Stand pivot transfers: Supervision       General transfer comment: No assist needed  Ambulation/Gait Ambulation/Gait assistance: Min guard;Supervision Gait Distance (Feet): 300 Feet Assistive device: 4-wheeled walker Gait Pattern/deviations: Step-through pattern;Decreased stride length;Wide base of support Gait velocity: decreased Gait velocity interpretation: 1.31 - 2.62  ft/sec, indicative of limited community ambulator General Gait Details: stable saturations with modest SOB with activity, ambulated on 3 liters today with 2-3 standing  rest breaks. No LOB with RW.  Good safety awareness.    Stairs             Wheelchair Mobility    Modified Rankin (Stroke Patients Only)       Balance                                            Cognition Arousal/Alertness: Awake/alert Behavior During Therapy: WFL for tasks assessed/performed Overall Cognitive Status: No family/caregiver present to determine baseline cognitive functioning                                        Exercises      General Comments        Pertinent Vitals/Pain Pain Assessment: No/denies pain    Home Living                      Prior Function            PT Goals (current goals can now be found in the care plan section) Acute Rehab PT Goals Patient Stated Goal: to go home Progress towards PT goals: Progressing toward goals    Frequency    Min 3X/week      PT Plan Current plan remains appropriate    Co-evaluation  AM-PAC PT "6 Clicks" Mobility   Outcome Measure  Help needed turning from your back to your side while in a flat bed without using bedrails?: None Help needed moving from lying on your back to sitting on the side of a flat bed without using bedrails?: None Help needed moving to and from a bed to a chair (including a wheelchair)?: A Little Help needed standing up from a chair using your arms (e.g., wheelchair or bedside chair)?: A Little Help needed to walk in hospital room?: A Little Help needed climbing 3-5 steps with a railing? : A Lot 6 Click Score: 19    End of Session Equipment Utilized During Treatment: Oxygen;Gait belt Activity Tolerance: Patient tolerated treatment well Patient left: in bed;with call bell/phone within reach(sitting EOB) Nurse Communication: Mobility  status PT Visit Diagnosis: Difficulty in walking, not elsewhere classified (R26.2)     Time: 4801-6553 PT Time Calculation (min) (ACUTE ONLY): 18 min  Charges:  $Gait Training: 8-22 mins                     [place] Pager:  [phone]  Office:  [place] 08/09/2018, 12:11 PM

## 2018-08-09 NOTE — Discharge Instructions (Signed)

## 2018-08-10 DIAGNOSIS — J438 Other emphysema: Secondary | ICD-10-CM | POA: Diagnosis not present

## 2018-08-10 DIAGNOSIS — J9621 Acute and chronic respiratory failure with hypoxia: Secondary | ICD-10-CM | POA: Diagnosis not present

## 2018-08-10 DIAGNOSIS — I252 Old myocardial infarction: Secondary | ICD-10-CM | POA: Diagnosis not present

## 2018-08-10 DIAGNOSIS — E1122 Type 2 diabetes mellitus with diabetic chronic kidney disease: Secondary | ICD-10-CM | POA: Diagnosis not present

## 2018-08-10 DIAGNOSIS — I130 Hypertensive heart and chronic kidney disease with heart failure and stage 1 through stage 4 chronic kidney disease, or unspecified chronic kidney disease: Secondary | ICD-10-CM | POA: Diagnosis not present

## 2018-08-10 DIAGNOSIS — D631 Anemia in chronic kidney disease: Secondary | ICD-10-CM | POA: Diagnosis not present

## 2018-08-10 DIAGNOSIS — N183 Chronic kidney disease, stage 3 (moderate): Secondary | ICD-10-CM | POA: Diagnosis not present

## 2018-08-10 DIAGNOSIS — I5042 Chronic combined systolic (congestive) and diastolic (congestive) heart failure: Secondary | ICD-10-CM | POA: Diagnosis not present

## 2018-08-10 DIAGNOSIS — F17210 Nicotine dependence, cigarettes, uncomplicated: Secondary | ICD-10-CM | POA: Diagnosis not present

## 2018-08-10 DIAGNOSIS — J9622 Acute and chronic respiratory failure with hypercapnia: Secondary | ICD-10-CM | POA: Diagnosis not present

## 2018-08-10 DIAGNOSIS — I25110 Atherosclerotic heart disease of native coronary artery with unstable angina pectoris: Secondary | ICD-10-CM | POA: Diagnosis not present

## 2018-08-10 DIAGNOSIS — I700 Atherosclerosis of aorta: Secondary | ICD-10-CM | POA: Diagnosis not present

## 2018-08-10 DIAGNOSIS — I255 Ischemic cardiomyopathy: Secondary | ICD-10-CM | POA: Diagnosis not present

## 2018-08-13 DIAGNOSIS — D631 Anemia in chronic kidney disease: Secondary | ICD-10-CM | POA: Diagnosis not present

## 2018-08-13 DIAGNOSIS — J9621 Acute and chronic respiratory failure with hypoxia: Secondary | ICD-10-CM | POA: Diagnosis not present

## 2018-08-13 DIAGNOSIS — I5042 Chronic combined systolic (congestive) and diastolic (congestive) heart failure: Secondary | ICD-10-CM | POA: Diagnosis not present

## 2018-08-13 DIAGNOSIS — F17210 Nicotine dependence, cigarettes, uncomplicated: Secondary | ICD-10-CM | POA: Diagnosis not present

## 2018-08-13 DIAGNOSIS — J438 Other emphysema: Secondary | ICD-10-CM | POA: Diagnosis not present

## 2018-08-13 DIAGNOSIS — J9622 Acute and chronic respiratory failure with hypercapnia: Secondary | ICD-10-CM | POA: Diagnosis not present

## 2018-08-13 DIAGNOSIS — I252 Old myocardial infarction: Secondary | ICD-10-CM | POA: Diagnosis not present

## 2018-08-13 DIAGNOSIS — I130 Hypertensive heart and chronic kidney disease with heart failure and stage 1 through stage 4 chronic kidney disease, or unspecified chronic kidney disease: Secondary | ICD-10-CM | POA: Diagnosis not present

## 2018-08-13 DIAGNOSIS — E1122 Type 2 diabetes mellitus with diabetic chronic kidney disease: Secondary | ICD-10-CM | POA: Diagnosis not present

## 2018-08-13 DIAGNOSIS — N183 Chronic kidney disease, stage 3 (moderate): Secondary | ICD-10-CM | POA: Diagnosis not present

## 2018-08-13 DIAGNOSIS — I700 Atherosclerosis of aorta: Secondary | ICD-10-CM | POA: Diagnosis not present

## 2018-08-13 DIAGNOSIS — I255 Ischemic cardiomyopathy: Secondary | ICD-10-CM | POA: Diagnosis not present

## 2018-08-13 DIAGNOSIS — I25110 Atherosclerotic heart disease of native coronary artery with unstable angina pectoris: Secondary | ICD-10-CM | POA: Diagnosis not present

## 2018-08-16 DIAGNOSIS — I130 Hypertensive heart and chronic kidney disease with heart failure and stage 1 through stage 4 chronic kidney disease, or unspecified chronic kidney disease: Secondary | ICD-10-CM | POA: Diagnosis not present

## 2018-08-16 DIAGNOSIS — I5042 Chronic combined systolic (congestive) and diastolic (congestive) heart failure: Secondary | ICD-10-CM | POA: Diagnosis not present

## 2018-08-16 DIAGNOSIS — I25110 Atherosclerotic heart disease of native coronary artery with unstable angina pectoris: Secondary | ICD-10-CM | POA: Diagnosis not present

## 2018-08-16 DIAGNOSIS — I252 Old myocardial infarction: Secondary | ICD-10-CM | POA: Diagnosis not present

## 2018-08-16 DIAGNOSIS — E1122 Type 2 diabetes mellitus with diabetic chronic kidney disease: Secondary | ICD-10-CM | POA: Diagnosis not present

## 2018-08-16 DIAGNOSIS — J9621 Acute and chronic respiratory failure with hypoxia: Secondary | ICD-10-CM | POA: Diagnosis not present

## 2018-08-16 DIAGNOSIS — J9622 Acute and chronic respiratory failure with hypercapnia: Secondary | ICD-10-CM | POA: Diagnosis not present

## 2018-08-16 DIAGNOSIS — I700 Atherosclerosis of aorta: Secondary | ICD-10-CM | POA: Diagnosis not present

## 2018-08-16 DIAGNOSIS — F17210 Nicotine dependence, cigarettes, uncomplicated: Secondary | ICD-10-CM | POA: Diagnosis not present

## 2018-08-16 DIAGNOSIS — D631 Anemia in chronic kidney disease: Secondary | ICD-10-CM | POA: Diagnosis not present

## 2018-08-16 DIAGNOSIS — I255 Ischemic cardiomyopathy: Secondary | ICD-10-CM | POA: Diagnosis not present

## 2018-08-16 DIAGNOSIS — N183 Chronic kidney disease, stage 3 (moderate): Secondary | ICD-10-CM | POA: Diagnosis not present

## 2018-08-16 DIAGNOSIS — J438 Other emphysema: Secondary | ICD-10-CM | POA: Diagnosis not present

## 2018-08-17 DIAGNOSIS — K625 Hemorrhage of anus and rectum: Secondary | ICD-10-CM | POA: Diagnosis not present

## 2018-08-17 DIAGNOSIS — K648 Other hemorrhoids: Secondary | ICD-10-CM | POA: Diagnosis not present

## 2018-08-21 DIAGNOSIS — J9612 Chronic respiratory failure with hypercapnia: Secondary | ICD-10-CM | POA: Diagnosis not present

## 2018-08-21 DIAGNOSIS — J9611 Chronic respiratory failure with hypoxia: Secondary | ICD-10-CM | POA: Diagnosis not present

## 2018-08-21 DIAGNOSIS — I5022 Chronic systolic (congestive) heart failure: Secondary | ICD-10-CM | POA: Diagnosis not present

## 2018-08-22 DIAGNOSIS — J438 Other emphysema: Secondary | ICD-10-CM | POA: Diagnosis not present

## 2018-08-22 DIAGNOSIS — N183 Chronic kidney disease, stage 3 (moderate): Secondary | ICD-10-CM | POA: Diagnosis not present

## 2018-08-22 DIAGNOSIS — E1122 Type 2 diabetes mellitus with diabetic chronic kidney disease: Secondary | ICD-10-CM | POA: Diagnosis not present

## 2018-08-22 DIAGNOSIS — I700 Atherosclerosis of aorta: Secondary | ICD-10-CM | POA: Diagnosis not present

## 2018-08-22 DIAGNOSIS — F17210 Nicotine dependence, cigarettes, uncomplicated: Secondary | ICD-10-CM | POA: Diagnosis not present

## 2018-08-22 DIAGNOSIS — J9622 Acute and chronic respiratory failure with hypercapnia: Secondary | ICD-10-CM | POA: Diagnosis not present

## 2018-08-22 DIAGNOSIS — I130 Hypertensive heart and chronic kidney disease with heart failure and stage 1 through stage 4 chronic kidney disease, or unspecified chronic kidney disease: Secondary | ICD-10-CM | POA: Diagnosis not present

## 2018-08-22 DIAGNOSIS — I5042 Chronic combined systolic (congestive) and diastolic (congestive) heart failure: Secondary | ICD-10-CM | POA: Diagnosis not present

## 2018-08-22 DIAGNOSIS — I25110 Atherosclerotic heart disease of native coronary artery with unstable angina pectoris: Secondary | ICD-10-CM | POA: Diagnosis not present

## 2018-08-22 DIAGNOSIS — D631 Anemia in chronic kidney disease: Secondary | ICD-10-CM | POA: Diagnosis not present

## 2018-08-22 DIAGNOSIS — I252 Old myocardial infarction: Secondary | ICD-10-CM | POA: Diagnosis not present

## 2018-08-22 DIAGNOSIS — J9621 Acute and chronic respiratory failure with hypoxia: Secondary | ICD-10-CM | POA: Diagnosis not present

## 2018-08-22 DIAGNOSIS — I255 Ischemic cardiomyopathy: Secondary | ICD-10-CM | POA: Diagnosis not present

## 2018-08-23 ENCOUNTER — Ambulatory Visit: Payer: Federal, State, Local not specified - PPO | Admitting: Internal Medicine

## 2018-08-23 ENCOUNTER — Other Ambulatory Visit: Payer: Self-pay

## 2018-08-23 ENCOUNTER — Encounter: Payer: Self-pay | Admitting: Internal Medicine

## 2018-08-23 DIAGNOSIS — I700 Atherosclerosis of aorta: Secondary | ICD-10-CM | POA: Diagnosis not present

## 2018-08-23 DIAGNOSIS — J9612 Chronic respiratory failure with hypercapnia: Secondary | ICD-10-CM | POA: Diagnosis not present

## 2018-08-23 DIAGNOSIS — J9611 Chronic respiratory failure with hypoxia: Secondary | ICD-10-CM

## 2018-08-23 DIAGNOSIS — I130 Hypertensive heart and chronic kidney disease with heart failure and stage 1 through stage 4 chronic kidney disease, or unspecified chronic kidney disease: Secondary | ICD-10-CM | POA: Diagnosis not present

## 2018-08-23 DIAGNOSIS — I252 Old myocardial infarction: Secondary | ICD-10-CM | POA: Diagnosis not present

## 2018-08-23 DIAGNOSIS — F17210 Nicotine dependence, cigarettes, uncomplicated: Secondary | ICD-10-CM

## 2018-08-23 DIAGNOSIS — J449 Chronic obstructive pulmonary disease, unspecified: Secondary | ICD-10-CM

## 2018-08-23 DIAGNOSIS — I25110 Atherosclerotic heart disease of native coronary artery with unstable angina pectoris: Secondary | ICD-10-CM | POA: Diagnosis not present

## 2018-08-23 DIAGNOSIS — D689 Coagulation defect, unspecified: Secondary | ICD-10-CM | POA: Insufficient documentation

## 2018-08-23 DIAGNOSIS — D631 Anemia in chronic kidney disease: Secondary | ICD-10-CM | POA: Diagnosis not present

## 2018-08-23 DIAGNOSIS — E1122 Type 2 diabetes mellitus with diabetic chronic kidney disease: Secondary | ICD-10-CM | POA: Diagnosis not present

## 2018-08-23 DIAGNOSIS — J9622 Acute and chronic respiratory failure with hypercapnia: Secondary | ICD-10-CM | POA: Diagnosis not present

## 2018-08-23 DIAGNOSIS — I255 Ischemic cardiomyopathy: Secondary | ICD-10-CM | POA: Diagnosis not present

## 2018-08-23 DIAGNOSIS — J438 Other emphysema: Secondary | ICD-10-CM | POA: Diagnosis not present

## 2018-08-23 DIAGNOSIS — I5042 Chronic combined systolic (congestive) and diastolic (congestive) heart failure: Secondary | ICD-10-CM | POA: Diagnosis not present

## 2018-08-23 DIAGNOSIS — N183 Chronic kidney disease, stage 3 (moderate): Secondary | ICD-10-CM | POA: Diagnosis not present

## 2018-08-23 DIAGNOSIS — J9621 Acute and chronic respiratory failure with hypoxia: Secondary | ICD-10-CM | POA: Diagnosis not present

## 2018-08-23 MED ORDER — METHYLPREDNISOLONE 8 MG PO TABS: ORAL_TABLET | ORAL | 2 refills | Status: DC

## 2018-08-23 NOTE — Progress Notes (Signed)
Crystal Yates, female    DOB: 1956/03/08,     MRN: [id number]    Brief patient profile:  [id number] active smoker born in [place] and lived in [place] then [place] with heart dz freq stents breathing was good until around 2006  while in [place]  Started seeing Chodri for sob dx as copd/emphysema  then worse 2011 requiring some 02 since 2015  With likely GOLD III copd (with restrictive component) suggested on initial spirometry 01/31/2018   Self referred to pulmonary clinic 01/31/2018 p admit  Date of Admission: 01/04/2018 12:48 AM Date of Discharge: 01/17/2018 Attending Physician: No att. providers fou   Discharge Diagnosis: Principal Problem:   Acute on chronic respiratory failure with hypoxia and hypercapnia ([place]) Active Problems:   Secondary adrenal insufficiency ([place])   Type 2 diabetes mellitus without complication, with long-term current use of insulin ([facility])   Acute on chronic combined systolic and diastolic heart failure ([place])   COPD without exacerbation ([place])   CAD (coronary artery disease)   Iron deficiency   Internal and external bleeding hemorrhoids   Sessile colonic polyp   Diverticulosis of colon without hemorrhage   Echo 10/19/17 Study Conclusions  - Left ventricle: The cavity size was normal. There was mild focal   basal hypertrophy of the septum. Systolic function was normal.   The estimated ejection fraction was in the range of 60% to 65%.   Wall motion was normal; there were no regional wall motion   abnormalities. Doppler parameters are consistent with abnormal   left ventricular relaxation (grade 1 diastolic dysfunction).   Doppler parameters are consistent with indeterminate ventricular   filling pressure. - Aortic valve: Transvalvular velocity was within the normal range.   There was no stenosis. There was no regurgitation. - Mitral valve: Transvalvular velocity was within the normal range.   There was no evidence for stenosis. There was trivial  regurgitation. - Right ventricle: The cavity size was normal. Wall thickness was   normal. Systolic function was normal. - Tricuspid valve: There was trivial regurgitation.    01/31/2018  Pulmonary/ 1st office eval/  Chief Complaint  Patient presents with  . Pulmonary Consult    Self referral. Pt states that she has COPD. She is on o2. She c/o SOB for the past 4 yrs, worse over the past 2 wks.   Dyspnea:  Can't do any walking at [employer] x 4 months - just room to room at home Cough: doe assoc with more cough than usual worse in am x 1 -2 tsp variably green x one year  Sleep: in recliner 30 degrees  SABA use: saba/ prednisone not much  rec Plan A = Automatic =Stop trelegy and start stiolto 2 pffs each am  Work on inhaler technique:  relax and gently blow all the way out then take a nice smooth deep breath back in, triggering the inhaler at same time you start breathing in.  Hold for up to 5 seconds if you can.   Rinse and gargle with water when done Plan B = Backup Only use your albuterol as a rescue medication  Plan C = Crisis - only use your albuterol nebulizer if you first try Plan B and it fails to help > ok to use the nebulizer up to every 4 hours but if start needing it regularly call for immediate appointment The key is to stop smoking completely before smoking completely stops you!   Please remember to go to the  x-ray department downstairs  in the basement  for your tests - we will call you with the results when they are available.   02/28/2018  f/u ov/ re:  Copd gold III maint on stiolto Chief Complaint  Patient presents with  . Follow-up    Breathing is not doing well today. She states she has good and bad days. She states she rarely uses her albuterol inhaler but she uses her neb 3 x per wk on average. She is still smoking,   Dyspnea:  Able to do 100 ft then sob = MMRC3 = can't walk 100 yards even at a slow pace at a flat grade s stopping due to sob  MMRC3 = can't walk  100 yards even at a slow pace at a flat grade s stopping due to sob   Cough: was better, some worse day of ov but only min mucoid sputum  Sleeping: in recliner x 30 degrees x years  SABA use: rarely 02: 2lpm 24/7  rec Check alpha one > MM No change medication    04/20/2018  f/u ov/ re:  COPD GOLD III criteria / confused with names of meds Chief Complaint  Patient presents with  . Acute Visit    Increased SOB since ran out of trelegy about 2 wks ago. She is using her albuterol inhaler 1-3 x per day. She uses neb 3 x per wk on average. She gets winded just walking a few ft. She is sometimes SOB just sitting. She has been coughing up some yellow sputum over the past 2 days.   Dyspnea:  More trouble walking [employer] barely get in door/ uses scooter, gets let off at door  Cough: minimal slt yellow x 2 days  Sleeping: 45 degrees in recliner SABA use: 2-3 x day, neb qod at most, hfa poor see a/p 02: 2-3lpm  rec The key is to stop smoking completely before smoking completely stops you! Work on inhaler technique:  depomedrol 120 mg IM today  Please schedule a follow up office visit in 4 weeks, sooner if needed  with all medications /inhalers/ solutions in hand so we can verify exactly what you are taking. This includes all medications from all doctors and over the counters   05/21/2018  f/u ov/ re:  GOLD III/   still smoking but did bring most meds  Chief Complaint  Patient presents with  . Follow-up    Increased SOB off and on over the past wk.  She states she gets winded and then loses bladder control.  She is using her albuterol inhaler about 4 x per day and neb maybe once per wk.  Dyspnea:  MMRC3 = can't walk 100 yards even at a slow pace at a flat grade s stopping due to sob   Cough:  Not as much except in am has chest congestion/ rattling but min sputum production   Sleeping: 30 degreees on bed  SABA use: as above  02: 2lpm 24/7 rec Plan A = Automatic = stioloto 2 pffs each  am  Work on inhaler technique:   Plan B = Backup Only use your albuterol inhaler as a rescue medication Plan C = Crisis - only use your albuterol nebulizer if you first try Plan B and it fails to help > ok to use the nebulizer up to every 4 hours but if start needing it regularly call for immediate appointment   08/23/2018  f/u ov/ re: copd III but on 2-3 lpm 24/7/ still smoking / maint on  stiolto Chief Complaint  Patient presents with  . Follow-up    Breathing has been worse "for the past five years"- back and forth from the hospital ever since the last visit. She is using her albuterol inhaler 3-4 x per day on average.    Dyspnea:  MMRC3 = can't walk 100 yards even at a slow pace at a flat grade s stopping due to sob on 3lpm   Cough: not bad now Sleeping: 30 degrees sleep number bed ok SABA use: about  One or twice neb daily  02: 2-3- lpm   No obvious day to day or daytime variability or assoc excess/ purulent sputum or mucus plugs or hemoptysis or cp or chest tightness, subjective wheeze or overt sinus or hb symptoms.   Sleeping as aove without nocturnal  or early am exacerbation  of respiratory  c/o's or need for noct saba. Also denies any obvious fluctuation of symptoms with weather or environmental changes or other aggravating or alleviating factors except as outlined above   No unusual exposure hx or h/o childhood pna/ asthma or knowledge of premature birth.  Current Allergies, Complete Past Medical History, Past Surgical History, Family History, and Social History were reviewed in [employer] record.  ROS  The following are not active complaints unless bolded Hoarseness, sore throat, dysphagia, dental problems, itching, sneezing,  nasal congestion or discharge of excess mucus or purulent secretions, ear ache,   fever, chills, sweats, unintended wt loss or wt gain, classically pleuritic or exertional cp,  orthopnea pnd or arm/hand swelling  or leg  swelling, presyncope, palpitations, abdominal pain, anorexia, nausea, vomiting, diarrhea  or change in bowel habits or change in bladder habits, change in stools or change in urine, dysuria, hematuria,  rash, arthralgias, visual complaints, headache, numbness, weakness or ataxia or problems with walking or coordination,  change in mood or  memory.        Current Meds  Medication Sig  . acetaminophen (TYLENOL) 500 MG tablet Take 1 tablet (500 mg total) by mouth every 6 (six) hours as needed for mild pain.  Marland Kitchen albuterol (PROAIR HFA) 108 (90 Base) MCG/ACT inhaler Inhale 2 puffs into the lungs every 4-6 hours as needed for shortness of breath or wheezing  . albuterol (PROVENTIL) (2.5 MG/3ML) 0.083% nebulizer solution Take 3 mLs (2.5 mg total) by nebulization every 6 (six) hours as needed for wheezing or shortness of breath.  Marland Kitchen aspirin EC 81 MG EC tablet Take 1 tablet (81 mg total) by mouth daily.  Marland Kitchen atorvastatin (LIPITOR) 20 MG tablet Take 20 mg by mouth every evening.  Marland Kitchen CALCIUM PO Take 1 tablet by mouth daily.  . Cholecalciferol (VITAMIN D3) 2000 units capsule Take 2,000 Units by mouth daily.   . clopidogrel (PLAVIX) 75 MG tablet Take 1 tablet (75 mg total) by mouth daily. (Patient taking differently: Take 75 mg by mouth at bedtime. )  . DULoxetine (CYMBALTA) 60 MG capsule Take 60 mg by mouth daily.   . hydrocortisone (ANUSOL-HC) 2.5 % rectal cream Place rectally 2 (two) times daily. (Patient taking differently: Place 1 application rectally daily as needed for hemorrhoids or anal itching. )  . hydrocortisone (CORTEF) 5 MG tablet Take 5-15 mg by mouth See admin instructions. Take 15 mg in the morning and 5 mg in the evening  . insulin degludec (TRESIBA FLEXTOUCH) 100 UNIT/ML SOPN FlexTouch Pen Inject 42 Units into the skin at bedtime.   . Iron-FA-B Cmp-C-Biot-Probiotic (FUSION PLUS) CAPS Take 1 capsule  by mouth at bedtime.   Marland Kitchen levothyroxine (SYNTHROID, LEVOTHROID) 150 MCG tablet Take 150 mcg by mouth  daily before breakfast.  . MAGNESIUM-OXIDE 400 (241.3 Mg) MG tablet Take 400 mg by mouth daily.  . nicotine (NICODERM CQ - DOSED IN MG/24 HOURS) 21 mg/24hr patch Place 1 patch (21 mg total) onto the skin daily.  . nitroGLYCERIN (NITROSTAT) 0.4 MG SL tablet Place 1 tablet (0.4 mg total) under the tongue every 5 (five) minutes as needed for chest pain.  . OXYGEN Place 2 L into the nose continuous.   . polyethylene glycol (MIRALAX / GLYCOLAX) packet Take 17 g by mouth daily. (Patient taking differently: Take 17 g by mouth daily as needed for mild constipation. )  . potassium chloride SA (K-DUR,KLOR-CON) 20 MEQ tablet Take 1 tablet (20 mEq total) by mouth daily. Take when you take Torsemide.  . Tiotropium Bromide-Olodaterol (STIOLTO RESPIMAT) 2.5-2.5 MCG/ACT AERS Inhale 2 puffs into the lungs daily.  . TRULICITY 1.5 KV/4.2VZ SOPN Inject 1.5 mg into the skin every Sunday.               Objective:     08/23/2018       224  05/21/2018       219   04/20/2018      21 9  02/28/18 215 lb (97.5 kg)  01/31/18 212 lb (96.2 kg)  01/31/18 212 lb (96.2 kg)    amb obese wf using rollator   Vital signs reviewed - Note on arrival 02 sats  95% on 2lpm pulsed         HEENT: Full dentures/ nl oropharynx. Nl external ear canals without cough reflex -  Mild bilateral non-specific turbinate edema     NECK :  without JVD/Nodes/TM/ nl carotid upstrokes bilaterally   LUNGS: no acc muscle use,  Mod barrel  contour chest wall with bilateral  Distant bs s audible wheeze and  without cough on insp or exp maneuver and mod  Hyperresonant  to  percussion bilaterally     CV:  RRR  no s3 or murmur or increase in P2, and no edema   ABD:  soft and nontender with pos mid insp Hoover's  in the supine position. No bruits or organomegaly appreciated, bowel sounds nl  MS:   Nl gait/  ext warm without deformities, calf tenderness, cyanosis or clubbing No obvious joint restrictions   SKIN: warm and dry without  lesions    NEURO:  alert, approp, nl sensorium with  no motor or cerebellar deficits apparent.           I personally reviewed images and agree with radiology impression as follows:  pCXR:   08/06/2018 No acute cardiopulmonary abnormality.    Assessment

## 2018-08-23 NOTE — Patient Instructions (Addendum)
Plan A = Automatic = stioloto 2 pffs each am   Work on inhaler technique:  relax and gently blow all the way out then take a nice smooth deep breath back in, triggering the inhaler at same time you start breathing in.  Hold for up to 5 seconds if you can. Rinse and gargle with water when done.    Plan B = Backup Only use your albuterol inhaler as a rescue medication to be used if you can't catch your breath by resting or doing a relaxed purse lip breathing pattern.  - The less you use it, the better it will work when you need it. - Ok to use the inhaler up to 2 puffs  every 4 hours if you must but call for appointment if use goes up over your usual need - Don't leave home without it !!  (think of it like the spare tire for your car)   Plan C = Crisis - only use your albuterol nebulizer if you first try Plan B and it fails to help > ok to use the nebulizer up to every 4 hours but if start needing it regularly call for immediate appointment   Plan D = Medrol  8mg  x 4 for one day, 3 x one day, 2 x one day ,and 1 or one day and off   Plan E = ER if all else fails   Please schedule a follow up office visit in 6 weeks, call sooner if needed with all medications /inhalers/ solutions in hand so we can verify exactly what you are taking. This includes all medications from all doctors and over the counters

## 2018-08-24 ENCOUNTER — Telehealth: Payer: Self-pay | Admitting: *Deleted

## 2018-08-24 NOTE — Telephone Encounter (Signed)
-----   Message from Tanda Rockers, MD sent at 08/23/2018  3:15 PM EDT ----- Let her know the Medrol was supposed to be 8mg  x 4pills on day one , 3 x one day, 2 x one day and 1 x one day

## 2018-08-24 NOTE — Telephone Encounter (Signed)
lmtcb

## 2018-08-25 DIAGNOSIS — J449 Chronic obstructive pulmonary disease, unspecified: Secondary | ICD-10-CM | POA: Diagnosis not present

## 2018-08-26 ENCOUNTER — Encounter: Payer: Self-pay | Admitting: Internal Medicine

## 2018-08-26 NOTE — Assessment & Plan Note (Addendum)
Active smoker Spirometry 01/31/2018  FEV1 0.9 (39%)  Ratio 63 p am  trelegy - 01/31/2018  After extensive coaching inhaler device,  effectiveness =    75% (short ti) on smi > try stiolto x 2 week sample > improved sltly 02/28/2018  - alpha one AT screening 02/28/2018   MM  Level 148  - Spirometry 04/20/2018  FEV1 1.1 (46%)  Ratio 67  Off rx   - 08/23/2018  After extensive coaching inhaler device,  effectiveness =    75% (short Ti, inconsistent trigger with onset of insp)  - 08/23/2018 added Medrol 8 mg x 4 day taper for flares   She continues to show very little insight into why she keeps having to go back to the hospital (smoking and inconsistent use of maint rx) so all I can offer at this point is a new "plan D" = medrol x 4 days for flares.

## 2018-08-26 NOTE — Assessment & Plan Note (Signed)

## 2018-08-26 NOTE — Assessment & Plan Note (Addendum)
First placed on 04/2013 HC03  01/17/18 =  33  HCO3  03/15/18 = 29 c/w improving hypercarbia - 04/20/2018   Walked 2lpm pulsed  3 laps @  approx 267ft each @ slow pace  stopped due to  End of study, no desat   - HCO3  08/08/18 = 33    Hypercarbic appears to be worsening likely from obesity plus copd so explained goal is to keep sats in the lower 90s and work on the underlying copd/smoking issues  Warned again about dangers of having 02 and cigs in same environment.

## 2018-08-27 DIAGNOSIS — I255 Ischemic cardiomyopathy: Secondary | ICD-10-CM | POA: Diagnosis not present

## 2018-08-27 DIAGNOSIS — I130 Hypertensive heart and chronic kidney disease with heart failure and stage 1 through stage 4 chronic kidney disease, or unspecified chronic kidney disease: Secondary | ICD-10-CM | POA: Diagnosis not present

## 2018-08-27 DIAGNOSIS — I25110 Atherosclerotic heart disease of native coronary artery with unstable angina pectoris: Secondary | ICD-10-CM | POA: Diagnosis not present

## 2018-08-27 DIAGNOSIS — I5042 Chronic combined systolic (congestive) and diastolic (congestive) heart failure: Secondary | ICD-10-CM | POA: Diagnosis not present

## 2018-08-27 DIAGNOSIS — J9621 Acute and chronic respiratory failure with hypoxia: Secondary | ICD-10-CM | POA: Diagnosis not present

## 2018-08-27 DIAGNOSIS — F17210 Nicotine dependence, cigarettes, uncomplicated: Secondary | ICD-10-CM | POA: Diagnosis not present

## 2018-08-27 DIAGNOSIS — I700 Atherosclerosis of aorta: Secondary | ICD-10-CM | POA: Diagnosis not present

## 2018-08-27 DIAGNOSIS — I252 Old myocardial infarction: Secondary | ICD-10-CM | POA: Diagnosis not present

## 2018-08-27 DIAGNOSIS — D631 Anemia in chronic kidney disease: Secondary | ICD-10-CM | POA: Diagnosis not present

## 2018-08-27 DIAGNOSIS — E1122 Type 2 diabetes mellitus with diabetic chronic kidney disease: Secondary | ICD-10-CM | POA: Diagnosis not present

## 2018-08-27 DIAGNOSIS — J9622 Acute and chronic respiratory failure with hypercapnia: Secondary | ICD-10-CM | POA: Diagnosis not present

## 2018-08-27 DIAGNOSIS — J438 Other emphysema: Secondary | ICD-10-CM | POA: Diagnosis not present

## 2018-08-27 DIAGNOSIS — N183 Chronic kidney disease, stage 3 (moderate): Secondary | ICD-10-CM | POA: Diagnosis not present

## 2018-08-27 NOTE — Telephone Encounter (Signed)
Pt is returning call. Cb is [phone].

## 2018-08-27 NOTE — Telephone Encounter (Signed)
Called and spoke with pt clarifying the instructions from MW on pt's medrol and pt stated she received the rx and stated it was 8mg  with the instructions on it which was what I stated to pt. Stated to pt to call if needed anything and pt verbalized understanding. Nothing further needed.

## 2018-08-28 ENCOUNTER — Other Ambulatory Visit: Payer: Self-pay

## 2018-08-28 ENCOUNTER — Telehealth (INDEPENDENT_AMBULATORY_CARE_PROVIDER_SITE_OTHER): Payer: Federal, State, Local not specified - PPO | Admitting: Cardiology

## 2018-08-28 ENCOUNTER — Encounter: Payer: Self-pay | Admitting: Cardiology

## 2018-08-28 VITALS — BP 123/70 | HR 112 | Wt 223.0 lb

## 2018-08-28 DIAGNOSIS — I10 Essential (primary) hypertension: Secondary | ICD-10-CM

## 2018-08-28 DIAGNOSIS — I255 Ischemic cardiomyopathy: Secondary | ICD-10-CM

## 2018-08-28 DIAGNOSIS — I5042 Chronic combined systolic (congestive) and diastolic (congestive) heart failure: Secondary | ICD-10-CM

## 2018-08-28 DIAGNOSIS — J438 Other emphysema: Secondary | ICD-10-CM

## 2018-08-28 NOTE — Progress Notes (Signed)
Virtual Visit via Video Note   This visit type was conducted due to national recommendations for restrictions regarding the COVID-19 Pandemic (e.g. social distancing) in an effort to limit this patient's exposure and mitigate transmission in our community.  Due to her co-morbid illnesses, this patient is at least at moderate risk for complications without adequate follow up.  This format is felt to be most appropriate for this patient at this time.  All issues noted in this document were discussed and addressed.  A limited physical exam was performed with this format.  Please refer to the patient's chart for her consent to telehealth for [facility].  Evaluation Performed:  Follow-up visit  This visit type was conducted due to national recommendations for restrictions regarding the COVID-19 Pandemic (e.g. social distancing).  This format is felt to be most appropriate for this patient at this time.  All issues noted in this document were discussed and addressed.  No physical exam was performed (except for noted visual exam findings with Video Visits).  Please refer to the patient's chart (MyChart message for video visits and phone note for telephone visits) for the patient's consent to telehealth for [facility].  Date:  08/28/2018  ID: Crystal Yates, DOB 1955/07/30, MRN [id number]   Patient Location: [place] [id number]   Provider location:   [place] Office  PCP:  Dortha Kern, [place]  Cardiologist:  Jenne Campus, MD     Chief Complaint: Doing well, still short of breath  History of Present Illness:    Crystal Yates is a 63 y.o. female  who presents via audio/video conferencing for a telehealth visit today.  Very complex past medical history which include cardiomyopathy 4045% ejection fraction, advanced COPD.  Sadly still continue to smoke but look like this time she is determined to quit we did talk in length about all the  situation this is very difficult to determine what part of her problem related to her heart what part of the problem is related to her lungs everything in my opinion point towards her lungs were now.  She has been put back on steroids and seems to be doing better but need a little more time to improved.   The patient does not have symptoms concerning for COVID-19 infection (fever, chills, cough, or new SHORTNESS OF BREATH).    Prior CV studies:   The following studies were reviewed today:       Past Medical History:  Diagnosis Date  . Acute on chronic systolic heart failure exacerbation(HCC) 04/08/2016  . Arthritis   . CAD in native artery    a. Prior LAD stenting based on cath. b. RCA stenting 03/2016 x2.  . Chronic combined systolic and diastolic CHF (congestive heart failure) ([place])   . CKD (chronic kidney disease), stage II   . COPD (chronic obstructive pulmonary disease) ([place])   . Diabetes mellitus without complication ([place])   . Hashimoto's thyroiditis   . Hyperlipidemia   . Hypertension   . Myocardial infarction ([place])   . On home oxygen therapy    "2L; 24/7" (10/23/2017)  . Secondary adrenal insufficiency ([place])   . Thyroid disease   . Tobacco abuse     Past Surgical History:  Procedure Laterality Date  . ABDOMINAL SURGERY    . CESAREAN SECTION    . CHOLECYSTECTOMY    . COLON RESECTION    . COLONOSCOPY WITH PROPOFOL N/A 01/16/2018   Procedure: COLONOSCOPY WITH  PROPOFOL;  Surgeon: Irving Copas., MD;  Location: [facility];  Service: Gastroenterology;  Laterality: N/A;  . CORONARY BALLOON ANGIOPLASTY N/A 10/13/2017   Procedure: CORONARY BALLOON ANGIOPLASTY;  Surgeon: Belva Crome, MD;  Location: [place] [facility];  Service: Cardiovascular;  Laterality: N/A;  . HERNIA MESH REMOVAL    . HERNIA REPAIR    . LEFT HEART CATH AND CORONARY ANGIOGRAPHY N/A 10/13/2016   Procedure: Left Heart Cath and Coronary Angiography;  Surgeon: Nelva Bush, MD;  Location: [place] [facility];  Service: Cardiovascular;  Laterality: N/A;  . POLYPECTOMY  01/16/2018   Procedure: POLYPECTOMY;  Surgeon: Rush Landmark Telford Nab., MD;  Location: [place];  Service: Gastroenterology;;  . RIGHT/LEFT HEART CATH AND CORONARY ANGIOGRAPHY N/A 10/13/2017   Procedure: RIGHT/LEFT HEART CATH AND CORONARY ANGIOGRAPHY;  Surgeon: Belva Crome, MD;  Location: [place] [facility];  Service: Cardiovascular;  Laterality: N/A;  . SHOULDER ARTHROSCOPY    . TUBAL LIGATION       Current Meds  Medication Sig  . acetaminophen (TYLENOL) 500 MG tablet Take 1 tablet (500 mg total) by mouth every 6 (six) hours as needed for mild pain.  Marland Kitchen albuterol (PROAIR HFA) 108 (90 Base) MCG/ACT inhaler Inhale 2 puffs into the lungs every 4-6 hours as needed for shortness of breath or wheezing  . albuterol (PROVENTIL) (2.5 MG/3ML) 0.083% nebulizer solution Take 3 mLs (2.5 mg total) by nebulization every 6 (six) hours as needed for wheezing or shortness of breath.  Marland Kitchen aspirin EC 81 MG EC tablet Take 1 tablet (81 mg total) by mouth daily.  Marland Kitchen atorvastatin (LIPITOR) 80 MG tablet Take 80 mg by mouth every evening.   Marland Kitchen CALCIUM PO Take 1 tablet by mouth daily.  . Cholecalciferol (VITAMIN D3) 2000 units capsule Take 2,000 Units by mouth daily.   . clopidogrel (PLAVIX) 75 MG tablet Take 1 tablet (75 mg total) by mouth daily. (Patient taking differently: Take 75 mg by mouth at bedtime. )  . DULoxetine (CYMBALTA) 60 MG capsule Take 60 mg by mouth 2 (two) times daily.   . hydrocortisone (CORTEF) 5 MG tablet Take 5-15 mg by mouth See admin instructions. Take 15 mg in the morning and 5 mg in the evening  . insulin degludec (TRESIBA FLEXTOUCH) 100 UNIT/ML SOPN FlexTouch Pen Inject 42 Units into the skin at bedtime.   . Iron-FA-B Cmp-C-Biot-Probiotic (FUSION PLUS) CAPS Take 1 capsule by mouth at bedtime.   Marland Kitchen levothyroxine (SYNTHROID, LEVOTHROID) 150 MCG tablet Take 150 mcg by mouth daily before breakfast.  . MAGNESIUM-OXIDE  400 (241.3 Mg) MG tablet Take 400 mg by mouth daily.  . methylPREDNISolone (MEDROL) 8 MG tablet For worse breathing take 4 x one day, 3 one done, 2 x 1 day and 1 x one day and stop  . nitroGLYCERIN (NITROSTAT) 0.4 MG SL tablet Place 1 tablet (0.4 mg total) under the tongue every 5 (five) minutes as needed for chest pain.  . OXYGEN Place 2 L into the nose continuous.   . polyethylene glycol (MIRALAX / GLYCOLAX) packet Take 17 g by mouth daily. (Patient taking differently: Take 17 g by mouth daily as needed for mild constipation. )  . potassium chloride SA (K-DUR,KLOR-CON) 20 MEQ tablet Take 1 tablet (20 mEq total) by mouth daily. Take when you take Torsemide.  . Tiotropium Bromide-Olodaterol (STIOLTO RESPIMAT) 2.5-2.5 MCG/ACT AERS Inhale 2 puffs into the lungs daily.  Marland Kitchen torsemide (DEMADEX) 20 MG tablet Take 4 tablets (80 mg total) by  mouth daily for 30 days.  . TRULICITY 1.5 IH/4.7QQ SOPN Inject 1.5 mg into the skin every Sunday.       Family History: The patient's family history includes Diabetes in her father, sister, sister, and son; Stroke in her mother.   ROS:   Please see the history of present illness.     All other systems reviewed and are negative.   Labs/Other Tests and Data Reviewed:     Recent Labs: 03/15/2018: NT-Pro BNP 858 07/22/2018: ALT 42 08/06/2018: B Natriuretic Peptide 160.3; Magnesium 1.8; TSH 1.316 08/08/2018: Hemoglobin 12.2; Platelets 198 08/09/2018: BUN 43; Creatinine, Ser 1.46; Potassium 3.5; Sodium 144  Recent Lipid Panel    Component Value Date/Time   CHOL 122 10/13/2016 0037   TRIG 68 10/13/2016 0037   HDL 34 (L) 10/13/2016 0037   CHOLHDL 3.6 10/13/2016 0037   VLDL 14 10/13/2016 0037   LDLCALC 74 10/13/2016 0037      Exam:    Vital Signs:  BP 123/70   Pulse (!) 112   Wt 223 lb (101.2 kg)   BMI 40.79 kg/m     Wt Readings from Last 3 Encounters:  08/28/18 223 lb (101.2 kg)  08/23/18 224 lb 6.4 oz (101.8 kg)  08/09/18 222 lb 11.2 oz (101  kg)     Well nourished, well developed in no acute distress. Alert awake oriented x3, she is sitting in her garage looking her new car that she just purchased.  She said that this is a woman cave and she like to watch TV over there.  Not in any distress quite cheerful actually.  I clearly can see changes in the face because of steroids.  Her face became more around  Diagnosis for this visit:   1. Ischemic cardiomyopathy   2. Chronic combined systolic and diastolic congestive heart failure ([place])   3. Essential hypertension   4. Other emphysema ([place])      ASSESSMENT & PLAN:    1.  Ischemic cardiomyopathy on appropriate medication she is able to tolerate which I will continue for now 2.  Chronic systolic and diastolic congestive heart failure difficult to assess stability but overall seems to be compensated 3.  Essential hypertension doing well 4.  Emphysema aggressive treatment as per pulmonary which I will continue.  Overall difficult situation trying to assess the significance of her cardiomyopathy is difficult because of comorbidities I have advised him to continue with the present medication I talked to her again in about a month at that time we probably cannot do proBNP trying to objectively establish where she is standing with her congestive heart failure.  She gained some weight but at the same time she was put on quite high dose of steroids.  COVID-19 Education: The signs and symptoms of COVID-19 were discussed with the patient and how to seek care for testing (follow up with PCP or arrange E-visit).  The importance of social distancing was discussed today.  Patient Risk:   After full review of this patients clinical status, I feel that they are at least moderate risk at this time.  Time:   Today, I have spent 18 minutes with the patient with telehealth technology discussing pt health issues.  I spent 5 minutes reviewing her chart before the visit.  Visit was finished at 12:03  PM.    Medication Adjustments/Labs and Tests Ordered: Current medicines are reviewed at length with the patient today.  Concerns regarding medicines are outlined above.  No orders of  the defined types were placed in this encounter.  Medication changes: No orders of the defined types were placed in this encounter.    Disposition: Follow-up 1 month  Signed, Park Liter, MD, [facility] 08/28/2018 12:03 PM    [place]

## 2018-08-28 NOTE — Patient Instructions (Signed)
Medication Instructions:  .isntcur  If you need a refill on your cardiac medications before your next appointment, please call your pharmacy.   Lab work: None.  If you have labs (blood work) drawn today and your tests are completely normal, you will receive your results only by: Marland Kitchen MyChart Message (if you have MyChart) OR . A paper copy in the mail If you have any lab test that is abnormal or we need to change your treatment, we will call you to review the results.  Testing/Procedures: None.   Follow-Up: At [facility], you and your health needs are our priority.  As part of our continuing mission to provide you with exceptional heart care, we have created designated Provider Care Teams.  These Care Teams include your primary Cardiologist (physician) and Advanced Practice Providers (APPs -  Physician Assistants and Nurse Practitioners) who all work together to provide you with the care you need, when you need it. You will need a follow up appointment in 1 months.  Please call our office 2 months in advance to schedule this appointment.  You may see Jenne Campus, MD or another member of our [facility] Provider Team in [place]: Shirlee More, MD . Jyl Heinz, MD  Any Other Special Instructions Will Be Listed Below (If Applicable).

## 2018-08-29 DIAGNOSIS — J9621 Acute and chronic respiratory failure with hypoxia: Secondary | ICD-10-CM | POA: Diagnosis not present

## 2018-08-29 DIAGNOSIS — E1122 Type 2 diabetes mellitus with diabetic chronic kidney disease: Secondary | ICD-10-CM | POA: Diagnosis not present

## 2018-08-29 DIAGNOSIS — I700 Atherosclerosis of aorta: Secondary | ICD-10-CM | POA: Diagnosis not present

## 2018-08-29 DIAGNOSIS — I130 Hypertensive heart and chronic kidney disease with heart failure and stage 1 through stage 4 chronic kidney disease, or unspecified chronic kidney disease: Secondary | ICD-10-CM | POA: Diagnosis not present

## 2018-08-29 DIAGNOSIS — F17210 Nicotine dependence, cigarettes, uncomplicated: Secondary | ICD-10-CM | POA: Diagnosis not present

## 2018-08-29 DIAGNOSIS — J9622 Acute and chronic respiratory failure with hypercapnia: Secondary | ICD-10-CM | POA: Diagnosis not present

## 2018-08-29 DIAGNOSIS — N183 Chronic kidney disease, stage 3 (moderate): Secondary | ICD-10-CM | POA: Diagnosis not present

## 2018-08-29 DIAGNOSIS — I255 Ischemic cardiomyopathy: Secondary | ICD-10-CM | POA: Diagnosis not present

## 2018-08-29 DIAGNOSIS — I252 Old myocardial infarction: Secondary | ICD-10-CM | POA: Diagnosis not present

## 2018-08-29 DIAGNOSIS — D631 Anemia in chronic kidney disease: Secondary | ICD-10-CM | POA: Diagnosis not present

## 2018-08-29 DIAGNOSIS — I5042 Chronic combined systolic (congestive) and diastolic (congestive) heart failure: Secondary | ICD-10-CM | POA: Diagnosis not present

## 2018-08-29 DIAGNOSIS — J438 Other emphysema: Secondary | ICD-10-CM | POA: Diagnosis not present

## 2018-08-29 DIAGNOSIS — I25110 Atherosclerotic heart disease of native coronary artery with unstable angina pectoris: Secondary | ICD-10-CM | POA: Diagnosis not present

## 2018-08-30 ENCOUNTER — Ambulatory Visit: Payer: Federal, State, Local not specified - PPO | Admitting: Internal Medicine

## 2018-09-03 DIAGNOSIS — D631 Anemia in chronic kidney disease: Secondary | ICD-10-CM | POA: Diagnosis not present

## 2018-09-03 DIAGNOSIS — J438 Other emphysema: Secondary | ICD-10-CM | POA: Diagnosis not present

## 2018-09-03 DIAGNOSIS — J9621 Acute and chronic respiratory failure with hypoxia: Secondary | ICD-10-CM | POA: Diagnosis not present

## 2018-09-03 DIAGNOSIS — F17210 Nicotine dependence, cigarettes, uncomplicated: Secondary | ICD-10-CM | POA: Diagnosis not present

## 2018-09-03 DIAGNOSIS — I130 Hypertensive heart and chronic kidney disease with heart failure and stage 1 through stage 4 chronic kidney disease, or unspecified chronic kidney disease: Secondary | ICD-10-CM | POA: Diagnosis not present

## 2018-09-03 DIAGNOSIS — E1122 Type 2 diabetes mellitus with diabetic chronic kidney disease: Secondary | ICD-10-CM | POA: Diagnosis not present

## 2018-09-03 DIAGNOSIS — I255 Ischemic cardiomyopathy: Secondary | ICD-10-CM | POA: Diagnosis not present

## 2018-09-03 DIAGNOSIS — N183 Chronic kidney disease, stage 3 (moderate): Secondary | ICD-10-CM | POA: Diagnosis not present

## 2018-09-03 DIAGNOSIS — I700 Atherosclerosis of aorta: Secondary | ICD-10-CM | POA: Diagnosis not present

## 2018-09-03 DIAGNOSIS — I25110 Atherosclerotic heart disease of native coronary artery with unstable angina pectoris: Secondary | ICD-10-CM | POA: Diagnosis not present

## 2018-09-03 DIAGNOSIS — J9622 Acute and chronic respiratory failure with hypercapnia: Secondary | ICD-10-CM | POA: Diagnosis not present

## 2018-09-03 DIAGNOSIS — I252 Old myocardial infarction: Secondary | ICD-10-CM | POA: Diagnosis not present

## 2018-09-03 DIAGNOSIS — I5042 Chronic combined systolic (congestive) and diastolic (congestive) heart failure: Secondary | ICD-10-CM | POA: Diagnosis not present

## 2018-09-04 ENCOUNTER — Ambulatory Visit (INDEPENDENT_AMBULATORY_CARE_PROVIDER_SITE_OTHER): Payer: Federal, State, Local not specified - PPO | Admitting: Nurse Practitioner

## 2018-09-04 ENCOUNTER — Telehealth: Payer: Self-pay | Admitting: Internal Medicine

## 2018-09-04 ENCOUNTER — Other Ambulatory Visit: Payer: Self-pay

## 2018-09-04 ENCOUNTER — Encounter: Payer: Self-pay | Admitting: Nurse Practitioner

## 2018-09-04 DIAGNOSIS — J441 Chronic obstructive pulmonary disease with (acute) exacerbation: Secondary | ICD-10-CM

## 2018-09-04 MED ORDER — DOXYCYCLINE HYCLATE 100 MG PO TABS: 100.0000 mg | ORAL_TABLET | Freq: Two times a day (BID) | ORAL | 0 refills | Status: DC

## 2018-09-04 NOTE — Patient Instructions (Addendum)
Will order doxycycline  Please quit smoking  Continue all other medications:  Plan A = Automatic = stioloto 2 pffs each am   Work on inhaler technique:  relax and gently blow all the way out then take a nice smooth deep breath back in, triggering the inhaler at same time you start breathing in.  Hold for up to 5 seconds if you can. Rinse and gargle with water when done.    Plan B = Backup Only use your albuterol inhaler as a rescue medication to be used if you can't catch your breath by resting or doing a relaxed purse lip breathing pattern.  - The less you use it, the better it will work when you need it. - Ok to use the inhaler up to 2 puffs  every 4 hours if you must but call for appointment if use goes up over your usual need - Don't leave home without it !!  (think of it like the spare tire for your car)   Plan C = Crisis - only use your albuterol nebulizer if you first try Plan B and it fails to help > ok to use the nebulizer up to every 4 hours but if start needing it regularly call for immediate appointment  Follow up: Follow up with Dr. Melvyn Novas in 2-4 weeks or sooner if needed

## 2018-09-04 NOTE — Progress Notes (Signed)
Virtual Visit via Telephone Note  I connected with Crystal Yates on 09/04/18 at 10:30 AM EDT by telephone and verified that I am speaking with the correct person using two identifiers.  Location: Patient: home Provider: office   I discussed the limitations, risks, security and privacy concerns of performing an evaluation and management service by telephone and the availability of in person appointments. I also discussed with the patient that there may be a patient responsible charge related to this service. The patient expressed understanding and agreed to proceed.   History of Present Illness: 63 year old female active smoker with COPD gold III, chronic respiratory failure with hypoxia and hypercapnia who is followed by Dr. Melvyn Novas.  Patient has a tele-visit today for an acute visit. Cough: Patient complains of cough. Symptoms began 2 days ago (09/02/18). Cough described as productive of green/yellow sputum, with shortness of breath, worsening over time. Patient denies chills, nasal congestion, sore throat and weight loss. Associated symptoms include dyspnea and wheezing. Patient has a history of chronic lung disease and pneumonia. Patient is compliant with albuterol and stiolto. Patient completed a prescription for medrol yesterday.  Patient admits that she does still smoke. She trying to cut back. Denies f/c/s, n/v/d, hemoptysis, PND, leg swelling.  Patient is on 2 L of O2 continuous and states that her sats have been stable.    Observations/Objective: Active smoker Spirometry 01/31/2018  FEV1 0.9 (39%)  Ratio 63 p am  trelegy - 01/31/2018  After extensive coaching inhaler device,  effectiveness =    75% (short ti) on smi > try stiolto x 2 week sample > improved sltly 02/28/2018  - alpha one AT screening 02/28/2018   MM  Level 148  - Spirometry 04/20/2018  FEV1 1.1 (46%)  Ratio 67  Off rx  - 08/23/2018  After extensive coaching inhaler device,  effectiveness =    75% (short Ti, inconsistent  trigger with onset of insp)  - 08/23/2018 added Medrol 8 mg x 4 day taper for flares   Assessment and Plan: Patient complains of cough. Symptoms began 2 days ago (09/02/18). Cough described as productive of green/yellow sputum, with shortness of breath, worsening over time. Patient denies chills, nasal congestion, sore throat and weight loss. Associated symptoms include dyspnea and wheezing. Patient has a history of chronic lung disease and pneumonia. Patient is compliant with albuterol and stiolto. Patient completed a prescription for medrol yesterday.  Patient admits that she does still smoke. She trying to cut back.  Plan: Patient Instructions  Will order doxycycline  Please quit smoking  Continue all other medications:  Plan A = Automatic = stioloto 2 pffs each am   Work on inhaler technique:  relax and gently blow all the way out then take a nice smooth deep breath back in, triggering the inhaler at same time you start breathing in.  Hold for up to 5 seconds if you can. Rinse and gargle with water when done.    Plan B = Backup Only use your albuterol inhaler as a rescue medication to be used if you can't catch your breath by resting or doing a relaxed purse lip breathing pattern.  - The less you use it, the better it will work when you need it. - Ok to use the inhaler up to 2 puffs  every 4 hours if you must but call for appointment if use goes up over your usual need - Don't leave home without it !!  (think of it like the spare  tire for your car)   Plan C = Crisis - only use your albuterol nebulizer if you first try Plan B and it fails to help > ok to use the nebulizer up to every 4 hours but if start needing it regularly call for immediate appointment    Follow Up Instructions: Follow up with Dr. Melvyn Novas in 2-4 weeks or sooner if needed   I discussed the assessment and treatment plan with the patient. The patient was provided an opportunity to ask questions and all were  answered. The patient agreed with the plan and demonstrated an understanding of the instructions.   The patient was advised to call back or seek an in-person evaluation if the symptoms worsen or if the condition fails to improve as anticipated.  I provided 22 minutes of non-face-to-face time during this encounter.   Fenton Foy, NP

## 2018-09-04 NOTE — Assessment & Plan Note (Signed)
Patient complains of cough. Symptoms began 2 days ago (09/02/18). Cough described as productive of green/yellow sputum, with shortness of breath, worsening over time. Patient denies chills, nasal congestion, sore throat and weight loss. Associated symptoms include dyspnea and wheezing. Patient has a history of chronic lung disease and pneumonia. Patient is compliant with albuterol and stiolto. Patient completed a prescription for medrol yesterday.  Patient admits that she does still smoke. She trying to cut back.  Plan: Patient Instructions  Will order doxycycline  Please quit smoking  Continue all other medications:  Plan A = Automatic = stioloto 2 pffs each am   Work on inhaler technique:  relax and gently blow all the way out then take a nice smooth deep breath back in, triggering the inhaler at same time you start breathing in.  Hold for up to 5 seconds if you can. Rinse and gargle with water when done.    Plan B = Backup Only use your albuterol inhaler as a rescue medication to be used if you can't catch your breath by resting or doing a relaxed purse lip breathing pattern.  - The less you use it, the better it will work when you need it. - Ok to use the inhaler up to 2 puffs  every 4 hours if you must but call for appointment if use goes up over your usual need - Don't leave home without it !!  (think of it like the spare tire for your car)   Plan C = Crisis - only use your albuterol nebulizer if you first try Plan B and it fails to help > ok to use the nebulizer up to every 4 hours but if start needing it regularly call for immediate appointment  Follow up: Follow up with Dr. Melvyn Novas in 2-4 weeks or sooner if needed

## 2018-09-04 NOTE — Telephone Encounter (Signed)
Spoke with the pt  She is c/o increased cough and congestion x 2 days  She is coughing up green sputum  Checked temp while on the phone- 97.7  She has had increased SOB and chest tightness  She is having some muscle aches that she relates to her Sjogren's  She denies any diarrhea, sore throat, other symptoms  She is still taking her Stiolto and using her albuterol a couple times per day without much help  She has used 2 rounds of medrol since the last appt with MW- finished yesterday  Mychart visit scheduled with Tonya for 10:30 am today

## 2018-09-05 ENCOUNTER — Encounter (HOSPITAL_COMMUNITY): Payer: Self-pay | Admitting: Emergency Medicine

## 2018-09-05 ENCOUNTER — Inpatient Hospital Stay (HOSPITAL_COMMUNITY)
Admission: EM | Admit: 2018-09-05 | Discharge: 2018-09-07 | DRG: 871 | Disposition: A | Discharge status: Home Health | Payer: Federal, State, Local not specified - PPO | Attending: Internal Medicine | Admitting: Internal Medicine

## 2018-09-05 ENCOUNTER — Emergency Department (HOSPITAL_COMMUNITY): Payer: Federal, State, Local not specified - PPO

## 2018-09-05 ENCOUNTER — Other Ambulatory Visit: Payer: Self-pay

## 2018-09-05 DIAGNOSIS — N183 Chronic kidney disease, stage 3 unspecified: Secondary | ICD-10-CM | POA: Diagnosis present

## 2018-09-05 DIAGNOSIS — R0602 Shortness of breath: Secondary | ICD-10-CM

## 2018-09-05 DIAGNOSIS — Z823 Family history of stroke: Secondary | ICD-10-CM | POA: Diagnosis not present

## 2018-09-05 DIAGNOSIS — Z66 Do not resuscitate: Secondary | ICD-10-CM | POA: Diagnosis present

## 2018-09-05 DIAGNOSIS — E2740 Unspecified adrenocortical insufficiency: Secondary | ICD-10-CM | POA: Diagnosis present

## 2018-09-05 DIAGNOSIS — I2510 Atherosclerotic heart disease of native coronary artery without angina pectoris: Secondary | ICD-10-CM | POA: Diagnosis present

## 2018-09-05 DIAGNOSIS — J9621 Acute and chronic respiratory failure with hypoxia: Secondary | ICD-10-CM | POA: Diagnosis not present

## 2018-09-05 DIAGNOSIS — A419 Sepsis, unspecified organism: Principal | ICD-10-CM | POA: Diagnosis present

## 2018-09-05 DIAGNOSIS — Z6841 Body Mass Index (BMI) 40.0 and over, adult: Secondary | ICD-10-CM

## 2018-09-05 DIAGNOSIS — Z20828 Contact with and (suspected) exposure to other viral communicable diseases: Secondary | ICD-10-CM | POA: Diagnosis present

## 2018-09-05 DIAGNOSIS — E785 Hyperlipidemia, unspecified: Secondary | ICD-10-CM | POA: Diagnosis present

## 2018-09-05 DIAGNOSIS — Z955 Presence of coronary angioplasty implant and graft: Secondary | ICD-10-CM

## 2018-09-05 DIAGNOSIS — J449 Chronic obstructive pulmonary disease, unspecified: Secondary | ICD-10-CM | POA: Diagnosis present

## 2018-09-05 DIAGNOSIS — Z7902 Long term (current) use of antithrombotics/antiplatelets: Secondary | ICD-10-CM | POA: Diagnosis not present

## 2018-09-05 DIAGNOSIS — E1129 Type 2 diabetes mellitus with other diabetic kidney complication: Secondary | ICD-10-CM | POA: Diagnosis present

## 2018-09-05 DIAGNOSIS — R0689 Other abnormalities of breathing: Secondary | ICD-10-CM | POA: Diagnosis not present

## 2018-09-05 DIAGNOSIS — E119 Type 2 diabetes mellitus without complications: Secondary | ICD-10-CM

## 2018-09-05 DIAGNOSIS — J440 Chronic obstructive pulmonary disease with acute lower respiratory infection: Secondary | ICD-10-CM | POA: Diagnosis not present

## 2018-09-05 DIAGNOSIS — R05 Cough: Secondary | ICD-10-CM | POA: Diagnosis not present

## 2018-09-05 DIAGNOSIS — E1122 Type 2 diabetes mellitus with diabetic chronic kidney disease: Secondary | ICD-10-CM | POA: Diagnosis present

## 2018-09-05 DIAGNOSIS — Z794 Long term (current) use of insulin: Secondary | ICD-10-CM

## 2018-09-05 DIAGNOSIS — E039 Hypothyroidism, unspecified: Secondary | ICD-10-CM | POA: Diagnosis not present

## 2018-09-05 DIAGNOSIS — Z7982 Long term (current) use of aspirin: Secondary | ICD-10-CM

## 2018-09-05 DIAGNOSIS — R000 Tachycardia, unspecified: Secondary | ICD-10-CM | POA: Diagnosis not present

## 2018-09-05 DIAGNOSIS — F17210 Nicotine dependence, cigarettes, uncomplicated: Secondary | ICD-10-CM | POA: Diagnosis present

## 2018-09-05 DIAGNOSIS — I130 Hypertensive heart and chronic kidney disease with heart failure and stage 1 through stage 4 chronic kidney disease, or unspecified chronic kidney disease: Secondary | ICD-10-CM | POA: Diagnosis not present

## 2018-09-05 DIAGNOSIS — K432 Incisional hernia without obstruction or gangrene: Secondary | ICD-10-CM | POA: Diagnosis not present

## 2018-09-05 DIAGNOSIS — I5042 Chronic combined systolic (congestive) and diastolic (congestive) heart failure: Secondary | ICD-10-CM | POA: Diagnosis not present

## 2018-09-05 DIAGNOSIS — Z9981 Dependence on supplemental oxygen: Secondary | ICD-10-CM

## 2018-09-05 DIAGNOSIS — J9622 Acute and chronic respiratory failure with hypercapnia: Secondary | ICD-10-CM | POA: Diagnosis not present

## 2018-09-05 DIAGNOSIS — J189 Pneumonia, unspecified organism: Secondary | ICD-10-CM | POA: Diagnosis not present

## 2018-09-05 DIAGNOSIS — Z833 Family history of diabetes mellitus: Secondary | ICD-10-CM

## 2018-09-05 DIAGNOSIS — E1165 Type 2 diabetes mellitus with hyperglycemia: Secondary | ICD-10-CM | POA: Diagnosis not present

## 2018-09-05 DIAGNOSIS — R059 Cough, unspecified: Secondary | ICD-10-CM

## 2018-09-05 LAB — URINALYSIS, ROUTINE W REFLEX MICROSCOPIC
Bilirubin Urine: NEGATIVE
Glucose, UA: NEGATIVE mg/dL
Hgb urine dipstick: NEGATIVE
Ketones, ur: NEGATIVE mg/dL
Nitrite: NEGATIVE
Protein, ur: NEGATIVE mg/dL
Specific Gravity, Urine: 1.012 (ref 1.005–1.030)
pH: 5 (ref 5.0–8.0)

## 2018-09-05 LAB — COMPREHENSIVE METABOLIC PANEL
ALT: 18 U/L (ref 0–44)
AST: 22 U/L (ref 15–41)
Albumin: 3.1 g/dL — ABNORMAL LOW (ref 3.5–5.0)
Alkaline Phosphatase: 99 U/L (ref 38–126)
Anion gap: 12 (ref 5–15)
BUN: 32 mg/dL — ABNORMAL HIGH (ref 8–23)
CO2: 35 mmol/L — ABNORMAL HIGH (ref 22–32)
Calcium: 9.6 mg/dL (ref 8.9–10.3)
Chloride: 90 mmol/L — ABNORMAL LOW (ref 98–111)
Creatinine, Ser: 1.25 mg/dL — ABNORMAL HIGH (ref 0.44–1.00)
GFR calc Af Amer: 53 mL/min — ABNORMAL LOW (ref >60)
GFR calc non Af Amer: 46 mL/min — ABNORMAL LOW (ref >60)
Glucose, Bld: 258 mg/dL — ABNORMAL HIGH (ref 70–99)
Potassium: 3.5 mmol/L (ref 3.5–5.1)
Sodium: 137 mmol/L (ref 135–145)
Total Bilirubin: 0.5 mg/dL (ref 0.3–1.2)
Total Protein: 6.7 g/dL (ref 6.5–8.1)

## 2018-09-05 LAB — GLUCOSE, CAPILLARY: Glucose-Capillary: 356 mg/dL — ABNORMAL HIGH (ref 70–99)

## 2018-09-05 LAB — PROCALCITONIN: Procalcitonin: <0.1 ng/mL

## 2018-09-05 LAB — PROTIME-INR
INR: 0.9 (ref 0.8–1.2)
Prothrombin Time: 12.2 seconds (ref 11.4–15.2)

## 2018-09-05 LAB — SARS CORONAVIRUS 2: SARS Coronavirus 2: NOT DETECTED

## 2018-09-05 LAB — CBC
HCT: 44.3 % (ref 36.0–46.0)
Hemoglobin: 14.3 g/dL (ref 12.0–15.0)
MCH: 29.7 pg (ref 26.0–34.0)
MCHC: 32.3 g/dL (ref 30.0–36.0)
MCV: 91.9 fL (ref 80.0–100.0)
Platelets: 229 10*3/uL (ref 150–400)
RBC: 4.82 MIL/uL (ref 3.87–5.11)
RDW: 13.7 % (ref 11.5–15.5)
WBC: 16.7 10*3/uL — ABNORMAL HIGH (ref 4.0–10.5)
nRBC: 0 % (ref 0.0–0.2)

## 2018-09-05 LAB — LACTIC ACID, PLASMA
Lactic Acid, Venous: 2.5 mmol/L (ref 0.5–1.9)
Lactic Acid, Venous: 1.7 mmol/L (ref 0.5–1.9)

## 2018-09-05 LAB — TROPONIN I
Troponin I: 0.05 ng/mL (ref <0.03)
Troponin I: 0.05 ng/mL (ref <0.03)
Troponin I: 0.04 ng/mL (ref <0.03)

## 2018-09-05 LAB — BRAIN NATRIURETIC PEPTIDE: B Natriuretic Peptide: 138.9 pg/mL — ABNORMAL HIGH (ref 0.0–100.0)

## 2018-09-05 LAB — CBG MONITORING, ED: Glucose-Capillary: 261 mg/dL — ABNORMAL HIGH (ref 70–99)

## 2018-09-05 MED ORDER — DULOXETINE HCL 60 MG PO CPEP
120.0000 mg | ORAL_CAPSULE | Freq: Every day | ORAL | Status: DC
  Administered 2018-09-05 – 2018-09-07 (×3): 120 mg via ORAL
  Filled 2018-09-05 (×4): qty 2

## 2018-09-05 MED ORDER — ASPIRIN EC 81 MG PO TBEC
81.0000 mg | DELAYED_RELEASE_TABLET | Freq: Every day | ORAL | Status: DC
  Administered 2018-09-05 – 2018-09-07 (×3): 81 mg via ORAL
  Filled 2018-09-05 (×3): qty 1

## 2018-09-05 MED ORDER — VANCOMYCIN HCL 10 G IV SOLR
2000.0000 mg | Freq: Once | INTRAVENOUS | Status: AC
  Administered 2018-09-05: 2000 mg via INTRAVENOUS
  Filled 2018-09-05: qty 2000

## 2018-09-05 MED ORDER — AEROCHAMBER PLUS FLO-VU LARGE MISC
Status: AC
  Administered 2018-09-05: 1
  Filled 2018-09-05: qty 1

## 2018-09-05 MED ORDER — ALBUTEROL SULFATE (2.5 MG/3ML) 0.083% IN NEBU: 3.0000 mL | INHALATION_SOLUTION | RESPIRATORY_TRACT | Status: DC | PRN

## 2018-09-05 MED ORDER — ALBUTEROL SULFATE HFA 108 (90 BASE) MCG/ACT IN AERS
8.0000 | INHALATION_SPRAY | Freq: Once | RESPIRATORY_TRACT | Status: AC
  Administered 2018-09-05: 8 via RESPIRATORY_TRACT
  Filled 2018-09-05: qty 6.7

## 2018-09-05 MED ORDER — PRAMIPEXOLE DIHYDROCHLORIDE 1 MG PO TABS
1.0000 mg | ORAL_TABLET | Freq: Two times a day (BID) | ORAL | Status: DC
  Administered 2018-09-05 – 2018-09-07 (×4): 1 mg via ORAL
  Filled 2018-09-05 (×7): qty 1

## 2018-09-05 MED ORDER — ZOLPIDEM TARTRATE 5 MG PO TABS
5.0000 mg | ORAL_TABLET | Freq: Every evening | ORAL | Status: DC | PRN
  Administered 2018-09-06: 5 mg via ORAL
  Filled 2018-09-05: qty 1

## 2018-09-05 MED ORDER — VANCOMYCIN HCL 10 G IV SOLR
1750.0000 mg | INTRAVENOUS | Status: DC
  Filled 2018-09-05: qty 1750

## 2018-09-05 MED ORDER — INSULIN GLARGINE 100 UNIT/ML ~~LOC~~ SOLN
42.0000 [IU] | Freq: Every day | SUBCUTANEOUS | Status: DC
  Administered 2018-09-05: 42 [IU] via SUBCUTANEOUS
  Filled 2018-09-05 (×2): qty 0.42

## 2018-09-05 MED ORDER — CLOPIDOGREL BISULFATE 75 MG PO TABS
75.0000 mg | ORAL_TABLET | Freq: Every day | ORAL | Status: DC
  Administered 2018-09-05 – 2018-09-06 (×2): 75 mg via ORAL
  Filled 2018-09-05 (×2): qty 1

## 2018-09-05 MED ORDER — TORSEMIDE 20 MG PO TABS
60.0000 mg | ORAL_TABLET | Freq: Every day | ORAL | Status: DC
  Administered 2018-09-06 – 2018-09-07 (×2): 60 mg via ORAL
  Filled 2018-09-05 (×3): qty 3

## 2018-09-05 MED ORDER — SODIUM CHLORIDE 0.9 % IV SOLN
INTRAVENOUS | Status: DC
  Administered 2018-09-05: 18:00:00 via INTRAVENOUS

## 2018-09-05 MED ORDER — TIOTROPIUM BROMIDE MONOHYDRATE 18 MCG IN CAPS: 18.0000 ug | ORAL_CAPSULE | Freq: Every day | RESPIRATORY_TRACT | Status: DC

## 2018-09-05 MED ORDER — POLYETHYLENE GLYCOL 3350 17 G PO PACK: 17.0000 g | PACK | Freq: Every day | ORAL | Status: DC | PRN

## 2018-09-05 MED ORDER — TORSEMIDE 20 MG PO TABS
10.0000 mg | ORAL_TABLET | Freq: Every day | ORAL | Status: DC
  Administered 2018-09-05 – 2018-09-06 (×2): 10 mg via ORAL
  Filled 2018-09-05: qty 1

## 2018-09-05 MED ORDER — ALBUTEROL SULFATE HFA 108 (90 BASE) MCG/ACT IN AERS
6.0000 | INHALATION_SPRAY | Freq: Once | RESPIRATORY_TRACT | Status: AC
  Administered 2018-09-05: 06:00:00 6 via RESPIRATORY_TRACT
  Filled 2018-09-05: qty 6.7

## 2018-09-05 MED ORDER — INSULIN ASPART 100 UNIT/ML ~~LOC~~ SOLN
0.0000 [IU] | Freq: Every day | SUBCUTANEOUS | Status: DC
  Administered 2018-09-05: 5 [IU] via SUBCUTANEOUS
  Administered 2018-09-06: 4 [IU] via SUBCUTANEOUS

## 2018-09-05 MED ORDER — HYDROCORTISONE NA SUCCINATE PF 100 MG IJ SOLR
50.0000 mg | Freq: Four times a day (QID) | INTRAMUSCULAR | Status: DC
  Administered 2018-09-05 – 2018-09-06 (×5): 50 mg via INTRAVENOUS
  Filled 2018-09-05 (×5): qty 2

## 2018-09-05 MED ORDER — UMECLIDINIUM BROMIDE 62.5 MCG/INH IN AEPB
1.0000 | INHALATION_SPRAY | Freq: Every day | RESPIRATORY_TRACT | Status: DC
  Administered 2018-09-06 – 2018-09-07 (×2): 1 via RESPIRATORY_TRACT
  Filled 2018-09-05: qty 7

## 2018-09-05 MED ORDER — SODIUM CHLORIDE 0.9% FLUSH
3.0000 mL | Freq: Once | INTRAVENOUS | Status: AC
  Administered 2018-09-05: 3 mL via INTRAVENOUS

## 2018-09-05 MED ORDER — SODIUM CHLORIDE 0.9 % IV SOLN: 1.0000 g | Freq: Once | INTRAVENOUS | Status: DC

## 2018-09-05 MED ORDER — ASPIRIN 81 MG PO TBEC: 81.0000 mg | DELAYED_RELEASE_TABLET | Freq: Every day | ORAL | Status: DC

## 2018-09-05 MED ORDER — NICOTINE 14 MG/24HR TD PT24
14.0000 mg | MEDICATED_PATCH | Freq: Every day | TRANSDERMAL | Status: DC
  Administered 2018-09-05 – 2018-09-07 (×3): 14 mg via TRANSDERMAL
  Filled 2018-09-05 (×3): qty 1

## 2018-09-05 MED ORDER — INSULIN ASPART 100 UNIT/ML ~~LOC~~ SOLN
0.0000 [IU] | Freq: Three times a day (TID) | SUBCUTANEOUS | Status: DC
  Administered 2018-09-06: 2 [IU] via SUBCUTANEOUS
  Administered 2018-09-06: 11 [IU] via SUBCUTANEOUS
  Administered 2018-09-06: 15 [IU] via SUBCUTANEOUS
  Administered 2018-09-07: 8 [IU] via SUBCUTANEOUS
  Administered 2018-09-07: 10 [IU] via SUBCUTANEOUS

## 2018-09-05 MED ORDER — ENOXAPARIN SODIUM 40 MG/0.4ML ~~LOC~~ SOLN
40.0000 mg | SUBCUTANEOUS | Status: DC
  Administered 2018-09-05 – 2018-09-06 (×2): 40 mg via SUBCUTANEOUS
  Filled 2018-09-05 (×2): qty 0.4

## 2018-09-05 MED ORDER — SODIUM CHLORIDE 0.9 % IV SOLN
2.0000 g | Freq: Two times a day (BID) | INTRAVENOUS | Status: DC
  Administered 2018-09-05 – 2018-09-07 (×4): 2 g via INTRAVENOUS
  Filled 2018-09-05 (×4): qty 2

## 2018-09-05 MED ORDER — ATORVASTATIN CALCIUM 80 MG PO TABS
80.0000 mg | ORAL_TABLET | Freq: Every evening | ORAL | Status: DC
  Administered 2018-09-05 – 2018-09-06 (×2): 80 mg via ORAL
  Filled 2018-09-05 (×2): qty 1

## 2018-09-05 MED ORDER — LEVOTHYROXINE SODIUM 75 MCG PO TABS
150.0000 ug | ORAL_TABLET | ORAL | Status: DC
  Administered 2018-09-06 – 2018-09-07 (×2): 150 ug via ORAL
  Filled 2018-09-05 (×2): qty 2

## 2018-09-05 NOTE — ED Notes (Addendum)
ED TO INPATIENT HANDOFF REPORT  ED Nurse Name and Phone #: Thurmond Butts [place] Name/Age/Gender Crystal Yates [place]eland 63 y.o. female Room/Bed: 056C/056C  Code Status   Code Status: DNR  Home/SNF/Other Home Patient oriented to: self, place, time and situation Is this baseline? Yes   Triage Complete: Triage complete  Chief Complaint sob  Triage Note Pt BIB by [facility] for SOB that is not like her usual SOB. EMS reports the pt told them she had fevers, body aches, and the SOB that is not her normal SOB. EMS reports pt has COPD and CHF which is what normally brings her to the hospital. EMS reports clear lung sounds bilaterally. EMS reports vital signs were stable however the pt was not on her oxygen like she is supposed to be and her initial oxygen saturation was 88%. EMS reports pt was a-febrile at 98.8 orally, CBG was 322, and blood pressure of 130/80. EMS reports 12 lead was unremarkable and unable to get an IV.  Pt reports she has a different kind of SOB along with fever, generalized body aches, and dizziness. Pt reports she does wear oxygen at home but took it off when EMS pulled into her driveway.    Allergies Allergies  Allergen Reactions  . Hydroxychloroquine Shortness Of Breath, Nausea Only and Other (See Comments)    Dizziness  . Donepezil Other (See Comments)    Dizziness, depression, and makes the patient feel "funny"  . Prednisone Other (See Comments)    Causes depression and suicidal thoughts  . Anticoagulant Cit Dext [Acd Formula A] Other (See Comments)    Unknown  . Bupropion Other (See Comments)    Suicidal thoughts  . Metrizamide Other (See Comments)    (a non-ionic radiopaque contrast agent) "Blows the vein" and contrast gathers at the injected site's limb  . Varenicline Other (See Comments)    Suicidal thoughts  . Tape Rash and Other (See Comments)    Paper tape is preferred, PLEASE    Level of Care/Admitting Diagnosis ED Disposition    ED  Disposition Condition [facility] Area: [place] [[id number]]  Level of Care: Telemetry Medical [104]  Covid Evaluation: Confirmed COVID Negative  Diagnosis: Sepsis due to pneumonia [facility]) [[id number]]  Admitting Physician: Karmen Bongo [[id number]]  Attending Physician: Karmen Bongo [[id number]]  Estimated length of stay: 3 - 4 days  Certification:: I certify this patient will need inpatient services for at least 2 midnights  PT Class (Do Not Modify): Inpatient [101]  PT Acc Code (Do Not Modify): Private [1]       B Medical/Surgery History Past Medical History:  Diagnosis Date  . Acute on chronic systolic heart failure exacerbation(HCC) 04/08/2016  . Arthritis   . CAD in native artery    a. Prior LAD stenting based on cath. b. RCA stenting 03/2016 x2.  . Chronic combined systolic and diastolic CHF (congestive heart failure) ([place])   . CKD (chronic kidney disease), stage II   . COPD (chronic obstructive pulmonary disease) ([place])   . Diabetes mellitus without complication ([place])   . Hashimoto's thyroiditis   . Hyperlipidemia   . Hypertension   . On home oxygen therapy    "2L; 24/7" (10/23/2017)  . Secondary adrenal insufficiency ([place])   . Thyroid disease   . Tobacco abuse    Past Surgical History:  Procedure Laterality Date  . ABDOMINAL SURGERY    . CESAREAN SECTION    . CHOLECYSTECTOMY    .  COLON RESECTION    . COLONOSCOPY WITH PROPOFOL N/A 01/16/2018   Procedure: COLONOSCOPY WITH PROPOFOL;  Surgeon: Rush Landmark Telford Nab., MD;  Location: [place];  Service: Gastroenterology;  Laterality: N/A;  . CORONARY BALLOON ANGIOPLASTY N/A 10/13/2017   Procedure: CORONARY BALLOON ANGIOPLASTY;  Surgeon: Belva Crome, MD;  Location: [place] CV LAB;  Service: Cardiovascular;  Laterality: N/A;  . HERNIA MESH REMOVAL    . HERNIA REPAIR    . LEFT HEART CATH AND CORONARY ANGIOGRAPHY N/A 10/13/2016   Procedure: Left Heart Cath and Coronary Angiography;  Surgeon:  Nelva Bush, MD;  Location: [place] CV LAB;  Service: Cardiovascular;  Laterality: N/A;  . POLYPECTOMY  01/16/2018   Procedure: POLYPECTOMY;  Surgeon: Rush Landmark Telford Nab., MD;  Location: [place];  Service: Gastroenterology;;  . RIGHT/LEFT HEART CATH AND CORONARY ANGIOGRAPHY N/A 10/13/2017   Procedure: RIGHT/LEFT HEART CATH AND CORONARY ANGIOGRAPHY;  Surgeon: Belva Crome, MD;  Location: [place] [facility];  Service: Cardiovascular;  Laterality: N/A;  . SHOULDER ARTHROSCOPY    . TUBAL LIGATION       A IV Location/Drains/Wounds Patient Lines/Drains/Airways Status   Active Line/Drains/Airways    Name:   Placement date:   Placement time:   Site:   Days:   Peripheral IV 09/05/18 Left Antecubital   09/05/18    0542    Antecubital   less than 1   Wound / Incision (Open or Dehisced) 12/24/17 Other (Comment) Buttocks Right Stage II   12/24/17    1651    Buttocks   255   Wound / Incision (Open or Dehisced) 12/24/17 Other (Comment) Abdomen Anterior post surgical bowel resection    12/24/17    1656    Abdomen   255          Intake/Output Last 24 hours No intake or output data in the 24 hours ending 09/05/18 1617  Labs/Imaging Results for orders placed or performed during the hospital encounter of 09/05/18 (from the past 48 hour(s))  CBC     Status: Abnormal   Collection Time: 09/05/18  5:42 AM  Result Value Ref Range   WBC 16.7 (H) 4.0 - 10.5 K/uL   RBC 4.82 3.87 - 5.11 MIL/uL   Hemoglobin 14.3 12.0 - 15.0 g/dL   HCT 44.3 36.0 - 46.0 %   MCV 91.9 80.0 - 100.0 fL   MCH 29.7 26.0 - 34.0 pg   MCHC 32.3 30.0 - 36.0 g/dL   RDW 13.7 11.5 - 15.5 %   Platelets 229 150 - 400 K/uL   nRBC 0.0 0.0 - 0.2 %    Comment: Performed at [facility], [address], [place], [place] [address]  Protime-INR (order if Patient is taking Coumadin / Warfarin)     Status: None   Collection Time: 09/05/18  5:42 AM  Result Value Ref Range   Prothrombin Time 12.2 11.4 - 15.2 seconds    INR 0.9 0.8 - 1.2    Comment: (NOTE) INR goal varies based on device and disease states. Performed at [facility], [place] [address] [place], [place] [address]   Comprehensive metabolic panel     Status: Abnormal   Collection Time: 09/05/18  5:44 AM  Result Value Ref Range   Sodium 137 135 - 145 mmol/L   Potassium 3.5 3.5 - 5.1 mmol/L   Chloride 90 (L) 98 - 111 mmol/L   CO2 35 (H) 22 - 32 mmol/L   Glucose, Bld 258 (  H) 70 - 99 mg/dL   BUN 32 (H) 8 - 23 mg/dL   Creatinine, Ser 1.25 (H) 0.44 - 1.00 mg/dL   Calcium 9.6 8.9 - 10.3 mg/dL   Total Protein 6.7 6.5 - 8.1 g/dL   Albumin 3.1 (L) 3.5 - 5.0 g/dL   AST 22 15 - 41 U/L   ALT 18 0 - 44 U/L   Alkaline Phosphatase 99 38 - 126 U/L   Total Bilirubin 0.5 0.3 - 1.2 mg/dL   GFR calc non Af Amer 46 (L) >60 mL/min   GFR calc Af Amer 53 (L) >60 mL/min   Anion gap 12 5 - 15    Comment: Performed at [facility] [address], [place], [place] [address]  Troponin I - ONCE - STAT     Status: Abnormal   Collection Time: 09/05/18  5:44 AM  Result Value Ref Range   Troponin I 0.05 (HH) <0.03 ng/mL    Comment: CRITICAL RESULT CALLED TO, READ BACK BY AND VERIFIED WITH: SCOTT MCCORD,RN AT 0703 09/05/2018 BY ZBEECH. Performed at [facility], [place] [address] [place], [place] [address]   CBG monitoring, ED     Status: Abnormal   Collection Time: 09/05/18  5:47 AM  Result Value Ref Range   Glucose-Capillary 261 (H) 70 - 99 mg/dL  Brain natriuretic peptide     Status: Abnormal   Collection Time: 09/05/18  6:02 AM  Result Value Ref Range   B Natriuretic Peptide 138.9 (H) 0.0 - 100.0 pg/mL    Comment: Performed at [facility] [address] [place], [place] [address]  SARS Coronavirus 2     Status: None   Collection Time: 09/05/18  6:05 AM  Result Value Ref Range   SARS Coronavirus 2 NOT DETECTED NOT DETECTED    Comment: (NOTE) SARS-CoV-2 target nucleic acids are NOT DETECTED. The SARS-CoV-2 RNA is generally  detectable in upper and lower respiratory specimens during the acute phase of infection.  Negative  results do not preclude SARS-CoV-2 infection, do not rule out co-infections with other pathogens, and should not be used as the sole basis for treatment or other patient management decisions.  Negative results must be combined with clinical observations, patient history, and epidemiological information. The expected result is Not Detected. Fact Sheet for Patients: http://www.biofiredefense.com/wp-content/uploads/2020/03/BIOFIRE-COVID -19-patients.pdf Fact Sheet for Healthcare Providers: http://www.biofiredefense.com/wp-content/uploads/2020/03/BIOFIRE-COVID -19-hcp.pdf This test is not yet approved or cleared by the [place] and  has been authorized for detection and/or diagnosis of SARS-CoV-2 by FDA under an Emergency Use Authorization (EUA).  This EUA will remain in effec t (meaning this test can be used) for the duration of  the COVID-19 declaration under Section 564(b)(1) of the Act, 21 U.S.C. section 360bbb-3(b)(1), unless the authorization is terminated or revoked sooner. Performed at [facility], [place] [address], [place], [place] [address]   Lactic acid, plasma     Status: Abnormal   Collection Time: 09/05/18  7:41 AM  Result Value Ref Range   Lactic Acid, Venous 2.5 (HH) 0.5 - 1.9 mmol/L    Comment: CRITICAL RESULT CALLED TO, READ BACK BY AND VERIFIED WITH: Zynasia Burklow,RN AT 3474 09/05/2018 BY ZBEECH. Performed at [facility], [place] [address], [place], [place] [address]   Troponin I - ONCE - STAT     Status: Abnormal   Collection Time: 09/05/18  9:48 AM  Result Value Ref Range   Troponin I 0.05 (HH) <0.03 ng/mL  Comment: CRITICAL VALUE NOTED.  VALUE IS CONSISTENT WITH PREVIOUSLY REPORTED AND CALLED VALUE. Performed at [facility], [place] [address] [place], [place] [address]   Lactic acid, plasma     Status: None   Collection Time: 09/05/18   9:51 AM  Result Value Ref Range   Lactic Acid, Venous 1.7 0.5 - 1.9 mmol/L    Comment: Performed at [facility] [address], [place], [place] [address]  Urinalysis, Routine w reflex microscopic     Status: Abnormal   Collection Time: 09/05/18 12:35 PM  Result Value Ref Range   Color, Urine YELLOW YELLOW   APPearance HAZY (A) CLEAR   Specific Gravity, Urine 1.012 1.005 - 1.030   pH 5.0 5.0 - 8.0   Glucose, UA NEGATIVE NEGATIVE mg/dL   Hgb urine dipstick NEGATIVE NEGATIVE   Bilirubin Urine NEGATIVE NEGATIVE   Ketones, ur NEGATIVE NEGATIVE mg/dL   Protein, ur NEGATIVE NEGATIVE mg/dL   Nitrite NEGATIVE NEGATIVE   Leukocytes,Ua SMALL (A) NEGATIVE   RBC / HPF 0-5 0 - 5 RBC/hpf   WBC, UA 21-50 0 - 5 WBC/hpf   Bacteria, UA FEW (A) NONE SEEN   Squamous Epithelial / LPF 0-5 0 - 5   WBC Clumps PRESENT    Mucus PRESENT    Hyaline Casts, UA PRESENT     Comment: Performed at [facility], [place] [address] [place], [place] [address]   Dg Chest 1 View  Result Date: 09/05/2018 CLINICAL DATA:  Atypical shortness of breath and cough. EXAM: CHEST  1 VIEW COMPARISON:  One-view chest x-ray 08/06/2018 FINDINGS: The heart is mildly enlarged. Atherosclerotic calcifications are present. Small effusions are suspected. Bibasilar opacities are worse left than right. Upper lung fields are clear. Lungs remain hyperexpanded. Cervical fusion is noted. IMPRESSION: 1. Borderline cardiomegaly without failure. 2. Suspect pleural effusions, left greater than right. 3. Mild bibasilar airspace disease likely reflects atelectasis. Infection, particularly at the left base, is not excluded. Electronically Signed   By: San Morelle M.D.   On: 09/05/2018 06:23    Pending Labs Unresulted Labs (From admission, onward)    Start     Ordered   09/06/18 3329  Basic metabolic panel  Tomorrow morning,   R     09/05/18 1312   09/06/18 0500  CBC WITH DIFFERENTIAL  Tomorrow morning,   R     09/05/18 1312    09/05/18 1310  Procalcitonin  ONCE - STAT,   R     09/05/18 1312   09/05/18 1309  Culture, sputum-assessment  Once,   R    Question:  Patient immune status  Answer:  Normal   09/05/18 1312   09/05/18 1309  Gram stain  Once,   R    Question:  Patient immune status  Answer:  Normal   09/05/18 1312   09/05/18 1309  HIV antibody (Routine Screening)  Once,   R     09/05/18 1312   09/05/18 1309  Strep pneumoniae urinary antigen  Once,   R     09/05/18 1312   09/05/18 0658  Blood culture (routine x 2)  BLOOD CULTURE X 2,   STAT     09/05/18 0657          Vitals/Pain Today's Vitals   09/05/18 1115 09/05/18 1130 09/05/18 1145 09/05/18 1200  BP: (!) 107/55 (!) 114/50 104/86 (!) 111/58  Pulse:      Resp: (!) 24 18 20  (!) 24  Temp:      TempSrc:      SpO2:      Weight:      Height:      PainSc:        Isolation Precautions No active isolations  Medications Medications  atorvastatin (LIPITOR) tablet 80 mg (has no administration in time range)  torsemide (DEMADEX) tablet 20-60 mg (has no administration in time range)  DULoxetine (CYMBALTA) DR capsule 120 mg (has no administration in time range)  zolpidem (AMBIEN) tablet 5 mg (has no administration in time range)  hydrocortisone sodium succinate (SOLU-CORTEF) 100 MG injection 50 mg (50 mg Intravenous Given 09/05/18 1409)  insulin degludec (TRESIBA) 100 UNIT/ML FlexTouch Pen 42 Units (has no administration in time range)  levothyroxine (SYNTHROID) tablet 150 mcg (has no administration in time range)  polyethylene glycol (MIRALAX / GLYCOLAX) packet 17 g (has no administration in time range)  clopidogrel (PLAVIX) tablet 75 mg (has no administration in time range)  pramipexole (MIRAPEX) tablet 1 mg (has no administration in time range)  albuterol (PROVENTIL) (2.5 MG/3ML) 0.083% nebulizer solution 3 mL (has no administration in time range)  tiotropium (SPIRIVA) inhalation capsule ([facility] use ONLY) 18 mcg (18 mcg Inhalation Not Given 09/05/18  1505)  enoxaparin (LOVENOX) injection 40 mg (has no administration in time range)  0.9 %  sodium chloride infusion (has no administration in time range)  nicotine (NICODERM CQ - dosed in mg/24 hours) patch 14 mg (has no administration in time range)  ceFEPIme (MAXIPIME) 2 g in sodium chloride 0.9 % 100 mL IVPB (2 g Intravenous New Bag/Given 09/05/18 1418)  vancomycin (VANCOCIN) 2,000 mg in sodium chloride 0.9 % 500 mL IVPB (2,000 mg Intravenous New Bag/Given 09/05/18 1541)  aspirin EC tablet 81 mg (has no administration in time range)  vancomycin (VANCOCIN) 1,750 mg in sodium chloride 0.9 % 500 mL IVPB (has no administration in time range)  sodium chloride flush (NS) 0.9 % injection 3 mL (3 mLs Intravenous Given 09/05/18 0543)  albuterol (VENTOLIN HFA) 108 (90 Base) MCG/ACT inhaler 6 puff (6 puffs Inhalation Given 09/05/18 5638)  AeroChamber Plus Flo-Vu Large MISC (1 each  Given 09/05/18 7564)  albuterol (VENTOLIN HFA) 108 (90 Base) MCG/ACT inhaler 8 puff (8 puffs Inhalation Given 09/05/18 1204)    Mobility walks with person assist Low fall risk   Focused Assessments Pulmonary Assessment Handoff:  Lung sounds: Bilateral Breath Sounds: Diminished, Expiratory wheezes L Breath Sounds: Diminished, Expiratory wheezes R Breath Sounds: Diminished, Expiratory wheezes O2 Device: Room Air O2 Flow Rate (L/min): 3 L/min      R Recommendations: See Admitting Provider Note  Report given to: Dorethea Clan RN  Additional Notes:

## 2018-09-05 NOTE — Progress Notes (Signed)
Chart and office note reviewed in detail  > agree with a/p as outlined    

## 2018-09-05 NOTE — Discharge Instructions (Addendum)
You have been seen today for cough and shortness of breath. Please read and follow all provided instructions.   1. Medications: usual home medications 2. Treatment: rest, drink plenty of fluids 3. Follow Up: Please follow up with your primary doctor in 2 days for discussion of your diagnoses and further evaluation after today's visit; if you do not have a primary care doctor use the resource guide provided to find one; Please return to the ER for any new or worsening symptoms. Please obtain all of your results from medical records or have your doctors office obtain the results - share them with your doctor - you should be seen at your doctors office. Call today to arrange your follow up.   Take medications as prescribed. Please review all of the medicines and only take them if you do not have an allergy to them. Return to the emergency room for worsening condition or new concerning symptoms. Follow up with your regular doctor. If you don't have a regular doctor use one of the numbers below to establish a primary care doctor.  Please be aware that if you are taking birth control pills, taking other prescriptions, ESPECIALLY ANTIBIOTICS may make the birth control ineffective - if this is the case, either do not engage in sexual activity or use alternative methods of birth control such as condoms until you have finished the medicine and your family doctor says it is OK to restart them. If you are on a blood thinner such as COUMADIN, be aware that any other medicine that you take may cause the coumadin to either work too much, or not enough - you should have your coumadin level rechecked in next 7 days if this is the case.  ?  It is also a possibility that you have an allergic reaction to any of the medicines that you have been prescribed - Everybody reacts differently to medications and while MOST people have no trouble with most medicines, you may have a reaction such as nausea, vomiting, rash, swelling,  shortness of breath. If this is the case, please stop taking the medicine immediately and contact your physician.  ?  You should return to the ER if you develop severe or worsening symptoms.   Emergency Department Resource Guide 1) Find a Doctor and Pay Out of Pocket Although you won't have to find out who is covered by your insurance plan, it is a good idea to ask around and get recommendations. You will then need to call the office and see if the doctor you have chosen will accept you as a new patient and what types of options they offer for patients who are self-pay. Some doctors offer discounts or will set up payment plans for their patients who do not have insurance, but you will need to ask so you aren't surprised when you get to your appointment.  2) Contact Your Local Health Department Not all health departments have doctors that can see patients for sick visits, but many do, so it is worth a call to see if yours does. If you don't know where your local health department is, you can check in your phone book. The CDC also has a tool to help you locate your state's health department, and many state websites also have listings of all of their local health departments.  3) Find a [facility] If your illness is not likely to be very severe or complicated, you may want to try a walk in clinic. These are popping  up all over the country in pharmacies, drugstores, and shopping centers. They're usually staffed by nurse practitioners or physician assistants that have been trained to treat common illnesses and complaints. They're usually fairly quick and inexpensive. However, if you have serious medical issues or chronic medical problems, these are probably not your best option.  No Primary Care Doctor: Call Health Connect at  [phone] - they can help you locate a primary care doctor that  accepts your insurance, provides certain services, etc. Physician Referral Service- [phone]  Chronic  Pain Problems: Organization         Address  Phone   Notes  [facility]  [phone] Patients need to be referred by their primary care doctor.   Medication Assistance: Organization         Address  Phone   Notes  [facility] Medication [facility] [address] [place], [place] [address] [phone] --Must be a resident of [facility] -- Must have NO insurance coverage whatsoever (no Medicaid/ Medicare, etc.) -- The pt. MUST have a primary care doctor that directs their care regularly and follows them in the community   [facility]  [phone]   [employer]  [phone]    Agencies that provide inexpensive medical care: Organization         Address  Phone   Notes  [place]  [phone]   [facility]    [phone]   [facility] [place], [place] [address] [phone]   [place] [address] [place] [phone]   [facility]    [phone]   [facility]    [phone]   [place] and [place] [address], [place] Phone:  [phone], Fax:  [phone] Hours of Operation:  9 am - 6 pm, M-F.  Also accepts Medicaid/Medicare and self-pay.  [facility] for [place], [address], [place] Phone: [phone], Fax: [phone]. Hours of Operation:  8:30 am - 5:30 pm, M-F.  Also accepts Medicaid and self-pay.  [facility], [facility], [facility] [place] [address] [place] Phone: [phone]   [place], [place], [place] [phone], Ext. [phone] Mondays & Thursdays: 7-9 AM.  First 15 patients are seen on a first come, first serve basis.    [place] Providers:  Organization         Address  Phone   Notes  [facility] [address], [place] [phone] Also  accepts self-pay patients.  [facility] [address], [place]  [phone]   [place], [address], [place] [phone]   [facility] [address], [place] [phone]   Lucianne Lei [address], [place]   [phone] Only accepts [place] [facility] [place] patients after they have their name applied to their card.   Self-Pay (no insurance) in [facility]:  Organization         Address  Phone   Notes  Sickle Cell Patients, [facility] [place] [phone]   [facility] [place] [phone]   [facility] [place]  [place]  [place], [address], [place] [phone]   [facility]/Dr. Osei-Bonsu  [address] [place] or [address], [place] [phone] Phone number for both [place] and [place] locations is the same.  Urgent Medical and [facility] [address], [place] [phone]   [facility] [address] [place] or [address] [phone]   [facility] [address], [place] [phone], phone; [phone], fax Sees patients 1st and 3rd Saturday of every month.  Must not qualify for public or private insurance (i.e. Medicaid, Medicare, [address] Health Choice, Veterans' Benefits)  Household income should be no more than 200% of the poverty level The clinic cannot treat you if you are pregnant or think you are pregnant  Sexually transmitted diseases are not treated at the clinic.

## 2018-09-05 NOTE — Progress Notes (Signed)
Pharmacy Antibiotic Note  Crystal Yates is a 63 y.o. female admitted on 09/05/2018 with pneumonia.  Pharmacy has been consulted for vancomycin dosing. Pt is afebrile but WBC is elevated at 16.7. Scr is 1.25, at baseline. Lactic acid has normalized.   Plan: Vancomycin 2gm IV x 1 then 1750mg  IV Q48H F/u renal fxn, C&S, clinical status and peak/trough at SS  Height: 5\' 2"  (157.5 cm) Weight: 224 lb 13.9 oz (102 kg) IBW/kg (Calculated) : 50.1  Temp (24hrs), Avg:98.4 F (36.9 C), Min:98.4 F (36.9 C), Max:98.4 F (36.9 C)  Recent Labs  Lab 09/05/18 0542 09/05/18 0544 09/05/18 0741 09/05/18 0951  WBC 16.7*  --   --   --   CREATININE  --  1.25*  --   --   LATICACIDVEN  --   --  2.5* 1.7    Estimated Creatinine Clearance: 51.6 mL/min (A) (by C-G formula based on SCr of 1.25 mg/dL (H)).    Allergies  Allergen Reactions  . Hydroxychloroquine Shortness Of Breath, Nausea Only and Other (See Comments)    Dizziness  . Donepezil Other (See Comments)    Dizziness, depression, and makes the patient feel "funny"  . Prednisone Other (See Comments)    Causes depression and suicidal thoughts  . Anticoagulant Cit Dext [Acd Formula A] Other (See Comments)    Unknown  . Bupropion Other (See Comments)    Suicidal thoughts  . Metrizamide Other (See Comments)    (a non-ionic radiopaque contrast agent) "Blows the vein" and contrast gathers at the injected site's limb  . Varenicline Other (See Comments)    Suicidal thoughts  . Tape Rash and Other (See Comments)    Paper tape is preferred, PLEASE    Antimicrobials this admission: Vanc 6/10>> Cefepime 6/10>>  Dose adjustments this admission: N/A  Microbiology results: Pending  Thank you for allowing pharmacy to be a part of this patient's care.  Addisson Frate, Rande Lawman 09/05/2018 1:33 PM

## 2018-09-05 NOTE — Progress Notes (Signed)
Pt has [place] scheduled for 2200, she did not bring her med from home, hospital does not carry this med. CBG 356, pt on IV steroids. Schorr, NP notified.
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
## 2018-09-05 NOTE — ED Provider Notes (Signed)
[facility] EMERGENCY DEPARTMENT Provider Note   CSN: [id number] Arrival date & time: 09/05/18  0626    History   Chief Complaint Chief Complaint  Patient presents with  . Chills  . Generalized Body Aches  . Fever  . Shortness of Breath    HPI Crystal Yates is a 63 y.o. female with a PMH of CHF, HTN, MI, COPD on 2-3L of oxygen at home, MI, and Thyroid disease presenting with worsening shortness of breath, fever, body aches, cough, and chills. Patient arrived via EMS [facility] from home. Patient states she has had a cough for a few weeks, but states she started having all these symptoms over the last few days. Patient states fever has been 47F at home. Patient states she was prescribed Doxycyline and has taken 1 pill last night. Patient denies taking any other medications. Patient reports nausea and sore throat. Patient denies chest pain, vomiting, diarrhea, or  abdominal pain. Patient states ventral hernia is intermittently tender to palpation. Patient states she has had a hernia repair in the past and states the mesh "disappeared." Patient denies sick contacts or recent travel. Patient denies drug use or alcohol use. Patient reports tobacco use.      HPI  Past Medical History:  Diagnosis Date  . Acute on chronic systolic heart failure exacerbation(HCC) 04/08/2016  . Arthritis   . CAD in native artery    a. Prior LAD stenting based on cath. b. RCA stenting 03/2016 x2.  . Chronic combined systolic and diastolic CHF (congestive heart failure) ([place])   . CKD (chronic kidney disease), stage II   . COPD (chronic obstructive pulmonary disease) ([place])   . Diabetes mellitus without complication ([place])   . Hashimoto's thyroiditis   . Hyperlipidemia   . Hypertension   . Myocardial infarction ([place])   . On home oxygen therapy    "2L; 24/7" (10/23/2017)  . Secondary adrenal insufficiency ([place])   . Thyroid disease   . Tobacco abuse     Patient Active Problem List    Diagnosis Date Noted  . Coagulation defect ([place]) 08/23/2018  . Acute respiratory failure with hypoxia ([place]) 08/06/2018  . Ventral hernia 08/06/2018  . Dyspnea 07/19/2018  . Acute diastolic CHF (congestive heart failure) ([place]) 07/19/2018  . Hypoglycemia 07/10/2018  . Abdominal pain 07/10/2018  . Alcohol use 07/10/2018  . Type II diabetes mellitus with renal manifestations ([place]) 07/10/2018  . Chronic kidney disease (CKD), stage II (mild) 07/10/2018  . Chronic diastolic CHF (congestive heart failure) ([place]) 07/10/2018  . COPD exacerbation ([place]) 07/10/2018  . Suspected Covid-19 Virus Infection 07/10/2018  . Chronic respiratory failure with hypoxia and hypercapnia ([place]) 02/01/2018  . Cigarette smoker 02/01/2018  . COPD  GOLD III/ c/b hypoxemia hypercarbia  01/31/2018  . Internal and external bleeding hemorrhoids 01/22/2018  . Sessile colonic polyp 01/22/2018  . Diverticulosis of colon without hemorrhage 01/22/2018  . Iron deficiency 01/11/2018  . Acute on chronic respiratory failure with hypoxia and hypercapnia ([place]) 01/04/2018  . Acute respiratory failure with hypercapnia ([place])   . AKI (acute kidney injury) ([place])   . Respiratory crackles at left lung base   . Morbid obesity ([place]) 12/29/2017  . Acute on chronic heart failure ([place]) 12/27/2017  . Hyperglycemia   . Coronary stent restenosis due to progression of disease   . Pseudoaneurysm following procedure ([place])   . Pseudoaneurysm ([place]) 10/17/2017  . S/P angioplasty for ISR of dRCA 10/13/17 10/14/2017  . CAD (coronary artery disease) 10/13/2017  .  Right upper lobe pneumonia ([place]) 05/06/2017  . Pneumonia due to respiratory syncytial virus (RSV) 04/21/2017  . Entrapment, radial nerve, right 03/31/2017  . COPD without exacerbation ([place]) 10/14/2016  . Acute on chronic combined systolic and diastolic heart failure ([place]) 10/13/2016  . Chest tightness or pressure 10/12/2016  . Dyspnea on exertion 09/29/2016  . History of coronary artery  disease 09/29/2016  . OAB (overactive bladder) 09/21/2016  . Neck pain 09/08/2016  . Paresthesia of arm 09/08/2016  . Flank pain 08/31/2016  . Neuralgia of right upper extremity 04/26/2016  . Coronary artery disease 04/16/2016  . Lymphocele after surgical procedure 04/16/2016  . Ventricular tachycardia (paroxysmal) ([place]) 04/09/2016  . Acute on chronic HFrEF (heart failure with reduced ejection fraction) ([place]) 04/08/2016  . Ischemic cardiomyopathy 04/08/2016  . Type 2 diabetes mellitus without complication, with long-term current use of insulin ([place]) 04/08/2016  . Anemia, blood loss 09/16/2015  . GI bleeding 09/16/2015  . Chest pain 05/22/2015  . Hypothyroid 05/22/2015  . Status post hernia repair 01/29/2015  . Surgical wound breakdown 01/29/2015  . Hernia with strangulation 01/18/2015  . Tobacco abuse 01/18/2015  . Recurrent incisional hernia with incarceration 01/15/2015  . Sleep apnea 01/15/2015  . S/P drug eluting coronary stent placement 10/16/2014  . Adrenal insufficiency ([place]) 11/21/2013  . Anemia 11/21/2013  . Unstable angina ([place]) 11/21/2013  . Atherosclerotic heart disease of native coronary artery without angina pectoris 11/21/2013  . Cardiomyopathy ([place]) 11/21/2013  . Congestive heart failure ([place]) 11/21/2013  . Diabetes mellitus with no complication ([place]) 87/86/7672  . Edema 11/21/2013  . Empty sella syndrome ([place]) 11/21/2013  . Hepatitis 11/21/2013  . Hyperlipemia 11/21/2013  . Hypertension 11/21/2013  . Kidney cyst, acquired 11/21/2013  . Fat necrosis 11/21/2013  . Pulmonary emphysema ([place]) 11/21/2013  . Sjogrens syndrome ([place]) 11/21/2013  . Thyroid disorder 11/21/2013  . Nicotine dependence, uncomplicated 09/47/0962  . Major depressive disorder, recurrent episode ([place]) 09/17/2013  . Displacement of lumbar intervertebral disc 06/14/2013  . Intractable migraine without aura 06/14/2013  . Mild cognitive impairment 06/14/2013  . Polyneuropathy in diabetes ([place])  06/14/2013  . Secondary adrenal insufficiency ([place]) 04/12/2011  . DDD (degenerative disc disease), lumbosacral 01/11/2008  . Thoracic or lumbosacral neuritis or radiculitis 01/11/2008    Past Surgical History:  Procedure Laterality Date  . ABDOMINAL SURGERY    . CESAREAN SECTION    . CHOLECYSTECTOMY    . COLON RESECTION    . COLONOSCOPY WITH PROPOFOL N/A 01/16/2018   Procedure: COLONOSCOPY WITH PROPOFOL;  Surgeon: Rush Landmark Telford Nab., MD;  Location: [facility];  Service: Gastroenterology;  Laterality: N/A;  . CORONARY BALLOON ANGIOPLASTY N/A 10/13/2017   Procedure: CORONARY BALLOON ANGIOPLASTY;  Surgeon: Belva Crome, MD;  Location: [facility];  Service: Cardiovascular;  Laterality: N/A;  . HERNIA MESH REMOVAL    . HERNIA REPAIR    . LEFT HEART CATH AND CORONARY ANGIOGRAPHY N/A 10/13/2016   Procedure: Left Heart Cath and Coronary Angiography;  Surgeon: Nelva Bush, MD;  Location: [facility];  Service: Cardiovascular;  Laterality: N/A;  . POLYPECTOMY  01/16/2018   Procedure: POLYPECTOMY;  Surgeon: Rush Landmark Telford Nab., MD;  Location: [facility];  Service: Gastroenterology;;  . RIGHT/LEFT HEART CATH AND CORONARY ANGIOGRAPHY N/A 10/13/2017   Procedure: RIGHT/LEFT HEART CATH AND CORONARY ANGIOGRAPHY;  Surgeon: Belva Crome, MD;  Location: [facility];  Service: Cardiovascular;  Laterality: N/A;  . SHOULDER ARTHROSCOPY    . TUBAL LIGATION  OB History   No obstetric history on file.      Home Medications    Prior to Admission medications   Medication Sig Start Date End Date Taking? Authorizing Provider  acetaminophen (TYLENOL) 500 MG tablet Take 1 tablet (500 mg total) by mouth every 6 (six) hours as needed for mild pain. 07/11/18   Mariel Aloe, MD  albuterol (PROAIR HFA) 108 (90 Base) MCG/ACT inhaler Inhale 2 puffs into the lungs every 4-6 hours as needed for shortness of breath or wheezing 01/31/18   Tanda Rockers, MD  albuterol  (PROVENTIL) (2.5 MG/3ML) 0.083% nebulizer solution Take 3 mLs (2.5 mg total) by nebulization every 6 (six) hours as needed for wheezing or shortness of breath. 02/28/18   Tanda Rockers, MD  aspirin EC 81 MG EC tablet Take 1 tablet (81 mg total) by mouth daily. 08/10/18   Kayleen Memos, DO  atorvastatin (LIPITOR) 80 MG tablet Take 80 mg by mouth every evening.  06/18/18   [provider]  CALCIUM PO Take 1 tablet by mouth daily.    [provider]  Cholecalciferol (VITAMIN D3) 2000 units capsule Take 2,000 Units by mouth daily.     [provider]  clopidogrel (PLAVIX) 75 MG tablet Take 1 tablet (75 mg total) by mouth daily. Patient taking differently: Take 75 mg by mouth at bedtime.  01/20/18   Masoudi, Dorthula Rue, MD  doxycycline (VIBRA-TABS) 100 MG tablet Take 1 tablet (100 mg total) by mouth 2 (two) times daily. 09/04/18   Fenton Foy, NP  DULoxetine (CYMBALTA) 60 MG capsule Take 60 mg by mouth 2 (two) times daily.  12/05/16   [provider]  hydrocortisone (CORTEF) 5 MG tablet Take 5-15 mg by mouth See admin instructions. Take 15 mg in the morning and 5 mg in the evening    [provider]  insulin degludec (TRESIBA FLEXTOUCH) 100 UNIT/ML SOPN FlexTouch Pen Inject 42 Units into the skin at bedtime.  08/28/16   [provider]  Iron-FA-B Cmp-C-Biot-Probiotic (FUSION PLUS) CAPS Take 1 capsule by mouth at bedtime.  10/09/16   [provider]  levothyroxine (SYNTHROID, LEVOTHROID) 150 MCG tablet Take 150 mcg by mouth daily before breakfast.    [provider]  MAGNESIUM-OXIDE 400 (241.3 Mg) MG tablet Take 400 mg by mouth daily. 06/18/18   [provider]  methylPREDNISolone (MEDROL) 8 MG tablet For worse breathing take 4 x one day, 3 one done, 2 x 1 day and 1 x one day and stop 08/23/18   Tanda Rockers, MD  nitroGLYCERIN (NITROSTAT) 0.4 MG SL tablet Place 1 tablet (0.4 mg total) under the tongue every 5 (five) minutes  as needed for chest pain. 07/19/18   Revankar, Reita Cliche, MD  OXYGEN Place 2 L into the nose continuous.     [provider]  polyethylene glycol (MIRALAX / GLYCOLAX) packet Take 17 g by mouth daily. Patient taking differently: Take 17 g by mouth daily as needed for mild constipation.  12/27/17   Masoudi, Elhamalsadat, MD  potassium chloride SA (K-DUR,KLOR-CON) 20 MEQ tablet Take 1 tablet (20 mEq total) by mouth daily. Take when you take Torsemide. 01/17/18   Masoudi, Elhamalsadat, MD  Tiotropium Bromide-Olodaterol (STIOLTO RESPIMAT) 2.5-2.5 MCG/ACT AERS Inhale 2 puffs into the lungs daily. 01/31/18   Tanda Rockers, MD  torsemide (DEMADEX) 20 MG tablet Take 4 tablets (80 mg total) by mouth daily for 30 days. 07/22/18 08/28/19  Elodia Florence.,  MD  TRULICITY 1.5 GE/9.5MW SOPN Inject 1.5 mg into the skin every Sunday.  10/10/16   [provider]    Family History Family History  Problem Relation Age of Onset  . Stroke Mother   . Diabetes Father   . Diabetes Sister   . Diabetes Sister   . Diabetes Son     Social History Social History   Tobacco Use  . Smoking status: Current Every Day Smoker    Packs/day: 1.00    Years: 44.00    Pack years: 44.00    Types: Cigarettes  . Smokeless tobacco: Never Used  Substance Use Topics  . Alcohol use: Yes    Alcohol/week: 7.0 standard drinks    Types: 7 Shots of liquor per week  . Drug use: Not Currently    Types: Marijuana    Comment: 10/23/2017 "had 1 ~ 1 month ago; nothing before or since"     Allergies   Hydroxychloroquine; Donepezil; Prednisone; Anticoagulant cit dext [acd formula a]; Bupropion; Metrizamide; Varenicline; and Tape   Review of Systems Review of Systems  Constitutional: Positive for chills, fatigue and fever. Negative for activity change and appetite change.  HENT: Positive for sore throat. Negative for congestion, ear pain, postnasal drip and rhinorrhea.   Eyes: Negative for pain, redness and  itching.  Respiratory: Positive for cough and shortness of breath.   Cardiovascular: Negative for chest pain.  Gastrointestinal: Negative for abdominal pain, diarrhea, nausea and vomiting.  Endocrine: Negative for polydipsia, polyphagia and polyuria.  Genitourinary: Negative for dysuria, flank pain and frequency.  Musculoskeletal: Positive for myalgias. Negative for neck pain.  Skin: Negative for rash and wound.  Allergic/Immunologic: Positive for immunocompromised state. Negative for environmental allergies.  Neurological: Negative for dizziness, weakness and headaches.  Hematological: Negative for adenopathy.     Physical Exam Updated Vital Signs BP (!) 119/53   Pulse (!) 107   Temp 98.4 F (36.9 C) (Oral)   Resp (!) 28   Ht 5\' 2"  (1.575 m)   Wt 102 kg   LMP  (Exact Date)   SpO2 99%   BMI 41.13 kg/m   Physical Exam Vitals signs and nursing note reviewed.  Constitutional:      General: She is not in acute distress.    Appearance: She is well-developed. She is obese. She is not diaphoretic.  HENT:     Head: Normocephalic and atraumatic.     Mouth/Throat:     Mouth: Mucous membranes are moist.     Pharynx: Oropharynx is clear. Uvula midline. Posterior oropharyngeal erythema present. No pharyngeal swelling or oropharyngeal exudate.  Eyes:     Extraocular Movements: Extraocular movements intact.     Pupils: Pupils are equal, round, and reactive to light.  Neck:     Musculoskeletal: Normal range of motion.  Cardiovascular:     Rate and Rhythm: Normal rate and regular rhythm.     Heart sounds: Normal heart sounds. No murmur. No friction rub. No gallop.   Pulmonary:     Effort: Pulmonary effort is normal. No respiratory distress.     Breath sounds: Examination of the right-upper field reveals wheezing. Examination of the left-upper field reveals wheezing. Examination of the right-middle field reveals wheezing. Examination of the left-middle field reveals wheezing.  Examination of the right-lower field reveals wheezing. Examination of the left-lower field reveals wheezing. Wheezing present. No rhonchi or rales.     Comments: Patient is speaking in full sentences with oxygen saturation at 98% on 2L  of oxygen.  Abdominal:     General: Abdomen is protuberant. A surgical scar is present.     Palpations: Abdomen is soft.     Tenderness: There is no abdominal tenderness. There is no right CVA tenderness, left CVA tenderness, guarding or rebound.     Hernia: A hernia (Ventral hernia present. Hernia is nontender to palpation. ) is present. Hernia is present in the ventral area.  Musculoskeletal: Normal range of motion.  Skin:    General: Skin is warm.     Findings: No erythema or rash.  Neurological:     Mental Status: She is alert.    ED Treatments / Results  Labs (all labs ordered are listed, but only abnormal results are displayed) Labs Reviewed  CBC - Abnormal; Notable for the following components:      Result Value   WBC 16.7 (*)    All other components within normal limits  CBG MONITORING, ED - Abnormal; Notable for the following components:   Glucose-Capillary 261 (*)    All other components within normal limits  SARS CORONAVIRUS 2  CULTURE, BLOOD (ROUTINE X 2)  CULTURE, BLOOD (ROUTINE X 2)  PROTIME-INR  COMPREHENSIVE METABOLIC PANEL  URINALYSIS, ROUTINE W REFLEX MICROSCOPIC  TROPONIN I  LACTIC ACID, PLASMA  LACTIC ACID, PLASMA  BRAIN NATRIURETIC PEPTIDE    EKG EKG Interpretation  Date/Time:  Wednesday September 05 2018 05:26:55 EDT Ventricular Rate:  104 PR Interval:    QRS Duration: 114 QT Interval:  365 QTC Calculation: 481 R Axis:   66 Text Interpretation:  Sinus tachycardia Biatrial enlargement Borderline intraventricular conduction delay Minimal ST depression, inferior leads When compared with ECG of 08/06/2018, Premature ventricular complexes are no longer present Confirmed by Delora Fuel ([id number]) on 09/05/2018 5:29:16 AM    Radiology Dg Chest 1 View  Result Date: 09/05/2018 CLINICAL DATA:  Atypical shortness of breath and cough. EXAM: CHEST  1 VIEW COMPARISON:  One-view chest x-ray 08/06/2018 FINDINGS: The heart is mildly enlarged. Atherosclerotic calcifications are present. Small effusions are suspected. Bibasilar opacities are worse left than right. Upper lung fields are clear. Lungs remain hyperexpanded. Cervical fusion is noted. IMPRESSION: 1. Borderline cardiomegaly without failure. 2. Suspect pleural effusions, left greater than right. 3. Mild bibasilar airspace disease likely reflects atelectasis. Infection, particularly at the left base, is not excluded. Electronically Signed   By: San Morelle M.D.   On: 09/05/2018 06:23    Procedures Procedures (including critical care time)  Medications Ordered in ED Medications  sodium chloride flush (NS) 0.9 % injection 3 mL (3 mLs Intravenous Given 09/05/18 0543)  albuterol (VENTOLIN HFA) 108 (90 Base) MCG/ACT inhaler 6 puff (6 puffs Inhalation Given 09/05/18 0607)  AeroChamber Plus Flo-Vu Large MISC (1 each  Given 09/05/18 0607)     Initial Impression / Assessment and Plan / ED Course  I have reviewed the triage vital signs and the nursing notes.  Pertinent labs & imaging results that were available during my care of the patient were reviewed by me and considered in my medical decision making (see chart for details).  Clinical Course as of Sep 04 713  Wed Sep 05, 2018  0631 Borderline cardiomegaly without failure. Suspect pleural effusions, left greater than right. Mild bibasilar airspace disease likely reflects atelectasis. Infection, particularly at the left base, is not excluded.    DG Chest 1 View [AH]  0655 Leukocytosis noted at 16.7. CBC is otherwise within normal limits.  WBC(!): 16.7 [AH]  0655 PT/INR within normal  limits.   Protime-INR (order if Patient is taking Coumadin / Warfarin) [AH]  0655 CBG is 261.  Glucose-Capillary(!): 261 [AH]   [id number] Troponin elevated at 0.05. This appears to be higher than previous values.   Troponin I - ONCE - STAT(!!) [AH]    Clinical Course User Index [AH] Arville Lime, PA-C      Patient presents with body aches, fever, chills, cough, and shortness of breath. Vitals reviewed. Patient is afebrile at this time. Leukocytosis noted at 16.7. Troponin elevated at 0.05 which appears to be slightly higher than previous values. CXR reveals borderline cardiomegaly and probable bilateral pleural effusions with left > right. CXR also reveals mild bibasilar airspace disease that may be due to atelectasis, but infection was not excluded. Patient had tachycardia and tachypnea. Ordered blood cultures and lactic acid. COVID-19 test pending. Labs are pending. UA is pending.   At shift change care was transferred to [facility], PA-C who will follow pending studies, re-evaluate and determine disposition.     Final Clinical Impressions(s) / ED Diagnoses   Final diagnoses:  Cough  Shortness of breath    ED Discharge Orders    None       Arville Lime, [place] 83/66/29 [id number]    Delora Fuel, MD 46/50/35 [id number]

## 2018-09-05 NOTE — Progress Notes (Signed)
Critical troponin 0.04, previous results 0.05. Pt asymptomatic, no chest pain, no SOB, VS stable. Schorr, NP aware.

## 2018-09-05 NOTE — ED Triage Notes (Signed)
Pt BIB by [facility] for SOB that is not like her usual SOB. EMS reports the pt told them she had fevers, body aches, and the SOB that is not her normal SOB. EMS reports pt has COPD and CHF which is what normally brings her to the hospital. EMS reports clear lung sounds bilaterally. EMS reports vital signs were stable however the pt was not on her oxygen like she is supposed to be and her initial oxygen saturation was 88%. EMS reports pt was a-febrile at 98.8 orally, CBG was 322, and blood pressure of 130/80. EMS reports 12 lead was unremarkable and unable to get an IV.  Pt reports she has a different kind of SOB along with fever, generalized body aches, and dizziness. Pt reports she does wear oxygen at home but took it off when EMS pulled into her driveway.

## 2018-09-05 NOTE — ED Provider Notes (Signed)
Pt's care assumed at 7am.   Chest xray shows possible pneumonia.  Pt is on doxycycline.  Pt reports no improvement with albuterol.  Pt has slight elevation of troponin 0.05.  Lactate acid initially elevated but has normalized.   Pt is currently on 4 liters of 02  sats are 98.  Pt is on 2 liters at home.    Second troponin and rpeat lactic acid ordered.  I will repeat albuterol and given Rocephin IV.    COnsult for admission    Sidney Ace 09/05/18 1058    Blanchie Dessert, MD 09/07/18 2127

## 2018-09-06 DIAGNOSIS — A419 Sepsis, unspecified organism: Secondary | ICD-10-CM | POA: Diagnosis not present

## 2018-09-06 DIAGNOSIS — K432 Incisional hernia without obstruction or gangrene: Secondary | ICD-10-CM | POA: Diagnosis not present

## 2018-09-06 DIAGNOSIS — J189 Pneumonia, unspecified organism: Secondary | ICD-10-CM | POA: Diagnosis not present

## 2018-09-06 LAB — BASIC METABOLIC PANEL
Anion gap: 9 (ref 5–15)
BUN: 34 mg/dL — ABNORMAL HIGH (ref 8–23)
CO2: 31 mmol/L (ref 22–32)
Calcium: 8.9 mg/dL (ref 8.9–10.3)
Chloride: 97 mmol/L — ABNORMAL LOW (ref 98–111)
Creatinine, Ser: 1.4 mg/dL — ABNORMAL HIGH (ref 0.44–1.00)
GFR calc Af Amer: 46 mL/min — ABNORMAL LOW (ref >60)
GFR calc non Af Amer: 40 mL/min — ABNORMAL LOW (ref >60)
Glucose, Bld: 438 mg/dL — ABNORMAL HIGH (ref 70–99)
Potassium: 4.6 mmol/L (ref 3.5–5.1)
Sodium: 137 mmol/L (ref 135–145)

## 2018-09-06 LAB — GLUCOSE, CAPILLARY
Glucose-Capillary: 441 mg/dL — ABNORMAL HIGH (ref 70–99)
Glucose-Capillary: 337 mg/dL — ABNORMAL HIGH (ref 70–99)
Glucose-Capillary: 57 mg/dL — ABNORMAL LOW (ref 70–99)
Glucose-Capillary: 122 mg/dL — ABNORMAL HIGH (ref 70–99)
Glucose-Capillary: 346 mg/dL — ABNORMAL HIGH (ref 70–99)

## 2018-09-06 LAB — CBC WITH DIFFERENTIAL/PLATELET
Abs Immature Granulocytes: 0.08 10*3/uL — ABNORMAL HIGH (ref 0.00–0.07)
Basophils Absolute: 0 10*3/uL (ref 0.0–0.1)
Basophils Relative: 0 %
Eosinophils Absolute: 0 10*3/uL (ref 0.0–0.5)
Eosinophils Relative: 0 %
HCT: 41.6 % (ref 36.0–46.0)
Hemoglobin: 13.3 g/dL (ref 12.0–15.0)
Immature Granulocytes: 1 %
Lymphocytes Relative: 6 %
Lymphs Abs: 0.7 10*3/uL (ref 0.7–4.0)
MCH: 29.8 pg (ref 26.0–34.0)
MCHC: 32 g/dL (ref 30.0–36.0)
MCV: 93.1 fL (ref 80.0–100.0)
Monocytes Absolute: 0.4 10*3/uL (ref 0.1–1.0)
Monocytes Relative: 3 %
Neutro Abs: 11.1 10*3/uL — ABNORMAL HIGH (ref 1.7–7.7)
Neutrophils Relative %: 90 %
Platelets: 203 10*3/uL (ref 150–400)
RBC: 4.47 MIL/uL (ref 3.87–5.11)
RDW: 13.5 % (ref 11.5–15.5)
WBC: 12.3 10*3/uL — ABNORMAL HIGH (ref 4.0–10.5)
nRBC: 0 % (ref 0.0–0.2)

## 2018-09-06 LAB — EXPECTORATED SPUTUM ASSESSMENT W GRAM STAIN, RFLX TO RESP C: Special Requests: NORMAL

## 2018-09-06 LAB — TROPONIN I
Troponin I: 0.03 ng/mL (ref <0.03)
Troponin I: 0.03 ng/mL (ref <0.03)

## 2018-09-06 LAB — HIV ANTIBODY (ROUTINE TESTING W REFLEX): HIV Screen 4th Generation wRfx: NONREACTIVE

## 2018-09-06 LAB — STREP PNEUMONIAE URINARY ANTIGEN: Strep Pneumo Urinary Antigen: NEGATIVE

## 2018-09-06 LAB — MAGNESIUM: Magnesium: 1.9 mg/dL (ref 1.7–2.4)

## 2018-09-06 MED ORDER — ORAL CARE MOUTH RINSE
15.0000 mL | Freq: Two times a day (BID) | OROMUCOSAL | Status: DC
  Administered 2018-09-06 – 2018-09-07 (×4): 15 mL via OROMUCOSAL

## 2018-09-06 MED ORDER — INSULIN GLARGINE 100 UNIT/ML ~~LOC~~ SOLN
30.0000 [IU] | Freq: Two times a day (BID) | SUBCUTANEOUS | Status: DC
  Administered 2018-09-06 – 2018-09-07 (×2): 30 [IU] via SUBCUTANEOUS
  Filled 2018-09-06 (×3): qty 0.3

## 2018-09-06 MED ORDER — HYDROCORTISONE NA SUCCINATE PF 100 MG IJ SOLR
50.0000 mg | Freq: Two times a day (BID) | INTRAMUSCULAR | Status: DC
  Administered 2018-09-06 – 2018-09-07 (×2): 50 mg via INTRAVENOUS
  Filled 2018-09-06 (×2): qty 2

## 2018-09-06 NOTE — Progress Notes (Signed)
Pt had 25 beat run of Vtach, on assessment pt asymptomatic, denied increased SOB, denied chest pain and palpitations, VS stable. Schorr, Np notified. Will continue to monitor pt.

## 2018-09-06 NOTE — Progress Notes (Signed)
Inpatient Diabetes Program Recommendations  AACE/ADA: New Consensus Statement on Inpatient Glycemic Control (2015)  Target Ranges:  Prepandial:   less than 140 mg/dL      Peak postprandial:   less than 180 mg/dL (1-2 hours)      Critically ill patients:  140 - 180 mg/dL   Results for Crystal Yates, Crystal Yates (MRN [id number]) as of 09/06/2018 10:22  Ref. Range 09/05/2018 05:47 09/05/2018 22:20 09/06/2018 08:21  Glucose-Capillary Latest Ref Range: 70 - 99 mg/dL 261 (H) 356 (H) 441 (H)    Review of Glycemic Control  Diabetes history: DM 2 Outpatient Diabetes medications: Tresiba 42 units, Trulicity 1.5 mg QSunday Current orders for Inpatient glycemic control: Lantus 42 units, Novolog 0-15 units tid, Novolog 0-5 units qhs  Inpatient Diabetes Program Recommendations:    Glucose trends increase into the 400's this am on home dose. Patient on Solucortef 50 mg Q6 hours. Consider increasing basal insulin to Lantus 30 units BID. May also need Novolog meal coverage if trends continue to be elevated.  Consult for Patient requesting education for DM. Lack of being able to see insulin pen needle and not checking her glucose as she should.  Placed educational materials on Blood glucose monitoring and glucose control with AVS. Patient has been on insulin for a long period of time on insulin pen injections. Patient should be able to hear clicks from dialing up the insulin pen if unable to visualize it.   Recommend ambulatory referral for DM education for outpatient setting. Also recommend for her to utilize her home health agency as well for DM assistance if needed.  Thanks,  Tama Headings RN, MSN, BC-ADM Inpatient Diabetes Coordinator Team Pager [phone] (8a-5p)

## 2018-09-06 NOTE — Progress Notes (Signed)
Marland Kitchen  PROGRESS NOTE    Crystal Yates  PXT:[id number] DOB: [birth year] DOA: 09/05/2018 PCP: Dortha Kern, PA   Brief Narrative:   Crystal Yates is a 63 y.o. female with medical history significant of thyroid disease; secondary adrenal insufficiency; CAD s/p stenting; HTN; HLD; DM; COPD on 2L home O2; stage 2 CKD; and chronic combined CHF (4/24 echo with EF 40-45% and grade 1 diastolic dysfunction) presenting with SOB.  Her breathing has been getting worse.  She started feeling worse a few days ago.  She is usually on 2L O2 and had to turn it up.  She has had a cough productive of yellow sputum.  "Mild" fever.  She is not feeling better now and does still feel SOB.   Assessment & Plan:   Principal Problem:   Sepsis due to pneumonia [name]   Adrenal insufficiency (HCC)   Hypothyroid   Morbid obesity ([place])   Acute on chronic respiratory failure with hypoxia and hypercapnia (HCC)   COPD  GOLD III/ c/b hypoxemia hypercarbia    Cigarette smoker   Type II diabetes mellitus with renal manifestations (HCC)   CKD (chronic kidney disease) stage 3, GFR 30-59 ml/min (HCC)   Chronic combined systolic (congestive) and diastolic (congestive) heart failure (HCC)   Sepsis due to PNA with acute on chronic respiratory failure associated with COPD     - SIRS criteria in this patient includes: Leukocytosis, tachycardia, tachypnea      - Patient has evidence of acute organ failure with lactic acidosis and worsening respiratory failure - usually on 2L home O2, currently requiring 4L     - While awaiting blood cultures, this appears to be a preseptic condition.     - Sepsis protocol initiated     - Suspected source is LLL PNA, superimposed on COPD     - COVID negative     - The patient will need treatment for HCAP due to MDR risk factors as above; will treat with Cefepime and Vancomycin.     - Additional complicating factors include: associated pleural effusions; and  immunocompromise.     - Sputum Cx pending, Bld Cx NGTD, step pneumo AG pending     - albuterol PRN     - Continue Incruse Ellipta  Adrenal insufficiency     - On chronic Cortef     - Will hold home dose and treat with stress-dosed steroids, hydrocortisone 50 mg IV q6h; taper as able  DM2 on long term insulin     - Recent A1c was 8.6 on 5/12     - She is on chronic steroids and will be receiving stress-dosed steroids here in the hospital     - takes [place] 42 units at home; steroids worsening DM; increase in-house Lantus to 30units BID     - Will cover with moderate-scale SSI including qhs for now     - patient states that she has difficulty with insulin administration; has requested DM counseling, DM education consulted; will also see what we can do as far as help outpt.  Stage 3 CKD     - Recent AKI but appears to be at/near baseline at this time  Chronic combined CHF     - Recent Echo with EF 40-45% and grade 1 DD     - Appears to be compensated at this time     - demadex resumed  Hypothyroidism     - Recent normal TSH     -  Continue Synthroid at current dose for now  Obesity     - BMI 40     - Outpatient bariatric medical or surgical referral should be considered  Ongoing tobacco dependence     - cessation discussed      - Patch ordered at patient request.  Continue abx. Let's transition to PO in the AM if she remains stable. Glucoses extremely high this morning owing to increased steroids. Will cover with increased Lantus; also let's taper as tolerated. Respiratory status is improving, but she does not feel at baseline. Patient also requesting DM education. Have consulted the DM team. Continue as above otherwise.   DVT prophylaxis: Lovenox Code Status: DNR   Disposition Plan: TBD   Antimicrobials:  Deniece Ree, cefepime    Subjective: "I've been telling them forever that I don't know how to use the insulin."  Objective: Vitals:   09/06/18 0546 09/06/18 0600  09/06/18 0820 09/06/18 0856  BP: (!) 107/57  136/75   Pulse: 89  89 92  Resp:  18 18 18   Temp: (!) 97.5 F (36.4 C)     TempSrc: Oral     SpO2: 99%  100% 98%  Weight:      Height:        Intake/Output Summary (Last 24 hours) at 09/06/2018 1137 Last data filed at 09/05/2018 2313 Gross per 24 hour  Intake 350 ml  Output 300 ml  Net 50 ml   Filed Weights   09/05/18 0532 09/05/18 1747  Weight: 102 kg 99.8 kg    Examination:  General: 63 y.o. female resting in bed in NAD Cardiovascular: RRR, +S1, S2, no m/g/r, equal pulses throughout Respiratory: exp wheeze, somewhat breathless with conversation GI: BS+, NDNT, no masses noted, no organomegaly noted, obese MSK: No c/c; BLE edema Skin: No rashes, bruises, ulcerations noted Neuro: A&O x 3, no focal deficits Psyc: Appropriate interaction and affect, calm/cooperative     Data Reviewed: I have personally reviewed following labs and imaging studies.  CBC: Recent Labs  Lab 09/05/18 0542 09/06/18 0658  WBC 16.7* 12.3*  NEUTROABS  --  11.1*  HGB 14.3 13.3  HCT 44.3 41.6  MCV 91.9 93.1  PLT 229 161   Basic Metabolic Panel: Recent Labs  Lab 09/05/18 0544 09/06/18 0658  NA 137 137  K 3.5 4.6  CL 90* 97*  CO2 35* 31  GLUCOSE 258* 438*  BUN 32* 34*  CREATININE 1.25* 1.40*  CALCIUM 9.6 8.9  MG  --  1.9   GFR: Estimated Creatinine Clearance: 45.5 mL/min (A) (by C-G formula based on SCr of 1.4 mg/dL (H)). Liver Function Tests: Recent Labs  Lab 09/05/18 0544  AST 22  ALT 18  ALKPHOS 99  BILITOT 0.5  PROT 6.7  ALBUMIN 3.1*   No results for input(s): LIPASE, AMYLASE in the last 168 hours. No results for input(s): AMMONIA in the last 168 hours. Coagulation Profile: Recent Labs  Lab 09/05/18 0542  INR 0.9   Cardiac Enzymes: Recent Labs  Lab 09/05/18 0544 09/05/18 0948 09/05/18 1932 09/06/18 0118 09/06/18 0658  TROPONINI 0.05* 0.05* 0.04* 0.03* 0.03*   BNP (last 3 results) Recent Labs    01/31/18  1230 03/15/18 1025  PROBNP 1,553* 858*   HbA1C: No results for input(s): HGBA1C in the last 72 hours. CBG: Recent Labs  Lab 09/05/18 0547 09/05/18 2220 09/06/18 0821  GLUCAP 261* 356* 441*   Lipid Profile: No results for input(s): CHOL, HDL, LDLCALC, TRIG, CHOLHDL, LDLDIRECT in the  last 72 hours. Thyroid Function Tests: No results for input(s): TSH, T4TOTAL, FREET4, T3FREE, THYROIDAB in the last 72 hours. Anemia Panel: No results for input(s): VITAMINB12, FOLATE, FERRITIN, TIBC, IRON, RETICCTPCT in the last 72 hours. Sepsis Labs: Recent Labs  Lab 09/05/18 0741 09/05/18 0951 09/05/18 1810  PROCALCITON  --   --  <0.10  LATICACIDVEN 2.5* 1.7  --     Recent Results (from the past 240 hour(s))  SARS Coronavirus 2     Status: None   Collection Time: 09/05/18  6:05 AM  Result Value Ref Range Status   SARS Coronavirus 2 NOT DETECTED NOT DETECTED Final    Comment: (NOTE) SARS-CoV-2 target nucleic acids are NOT DETECTED. The SARS-CoV-2 RNA is generally detectable in upper and lower respiratory specimens during the acute phase of infection.  Negative  results do not preclude SARS-CoV-2 infection, do not rule out co-infections with other pathogens, and should not be used as the sole basis for treatment or other patient management decisions.  Negative results must be combined with clinical observations, patient history, and epidemiological information. The expected result is Not Detected. Fact Sheet for Patients: http://www.biofiredefense.com/wp-content/uploads/2020/03/BIOFIRE-COVID -19-patients.pdf Fact Sheet for Healthcare Providers: http://www.biofiredefense.com/wp-content/uploads/2020/03/BIOFIRE-COVID -19-hcp.pdf This test is not yet approved or cleared by the [place] and  has been authorized for detection and/or diagnosis of SARS-CoV-2 by FDA under an Emergency Use Authorization (EUA).  This EUA will remain in effec t (meaning this test can be used) for the  duration of  the COVID-19 declaration under Section 564(b)(1) of the Act, 21 U.S.C. section 360bbb-3(b)(1), unless the authorization is terminated or revoked sooner. Performed at [facility], [place] [address] [place], [place] [address]   Blood culture (routine x 2)     Status: None (Preliminary result)   Collection Time: 09/05/18  7:41 AM   Specimen: BLOOD  Result Value Ref Range Status   Specimen Description BLOOD RIGHT ANTECUBITAL  Final   Special Requests   Final    BOTTLES DRAWN AEROBIC AND ANAEROBIC Blood Culture results may not be optimal due to an excessive volume of blood received in culture bottles   Culture   Final    NO GROWTH 1 DAY Performed at [facility], [place] [address] [place], [place] [address]    Report Status PENDING  Incomplete  Blood culture (routine x 2)     Status: None (Preliminary result)   Collection Time: 09/05/18  7:41 AM   Specimen: BLOOD  Result Value Ref Range Status   Specimen Description BLOOD BLOOD LEFT WRIST  Final   Special Requests   Final    BOTTLES DRAWN AEROBIC AND ANAEROBIC Blood Culture adequate volume   Culture   Final    NO GROWTH 1 DAY Performed at [facility], [place] [address] [place], [place] [address]    Report Status PENDING  Incomplete  Culture, sputum-assessment     Status: None   Collection Time: 09/05/18  1:09 PM   Specimen: Sputum  Result Value Ref Range Status   Specimen Description SPUTUM  Final   Special Requests Normal  Final   Sputum evaluation   Final    THIS SPECIMEN IS ACCEPTABLE FOR SPUTUM CULTURE Performed at [facility], [address] [place], [place] [address]    Report Status 09/06/2018 FINAL  Final  Culture, respiratory     Status: None (Preliminary result)   Collection Time: 09/05/18  1:09 PM   Specimen: SPU  Result  Value Ref Range Status   Specimen Description SPUTUM  Final   Special Requests Normal Reflexed from [id number]  Final   Gram Stain   Final    FEW WBC PRESENT,  PREDOMINANTLY PMN RARE SQUAMOUS EPITHELIAL CELLS PRESENT FEW GRAM POSITIVE RODS RARE BUDDING YEAST SEEN Performed at [facility], [address], [place], [place] [address]    Culture PENDING  Incomplete   Report Status PENDING  Incomplete         Radiology Studies: Dg Chest 1 View  Result Date: 09/05/2018 CLINICAL DATA:  Atypical shortness of breath and cough. EXAM: CHEST  1 VIEW COMPARISON:  One-view chest x-ray 08/06/2018 FINDINGS: The heart is mildly enlarged. Atherosclerotic calcifications are present. Small effusions are suspected. Bibasilar opacities are worse left than right. Upper lung fields are clear. Lungs remain hyperexpanded. Cervical fusion is noted. IMPRESSION: 1. Borderline cardiomegaly without failure. 2. Suspect pleural effusions, left greater than right. 3. Mild bibasilar airspace disease likely reflects atelectasis. Infection, particularly at the left base, is not excluded. Electronically Signed   By: San Morelle M.D.   On: 09/05/2018 06:23        Scheduled Meds: . aspirin EC  81 mg Oral Daily  . atorvastatin  80 mg Oral QPM  . clopidogrel  75 mg Oral QHS  . DULoxetine  120 mg Oral Daily  . enoxaparin (LOVENOX) injection  40 mg Subcutaneous Q24H  . hydrocortisone sod succinate (SOLU-CORTEF) inj  50 mg Intravenous Q6H  . insulin aspart  0-15 Units Subcutaneous TID WC  . insulin aspart  0-5 Units Subcutaneous QHS  . insulin glargine  42 Units Subcutaneous QHS  . levothyroxine  150 mcg Oral Q24H  . mouth rinse  15 mL Mouth Rinse BID  . nicotine  14 mg Transdermal Daily  . pramipexole  1 mg Oral BID  . torsemide  10 mg Oral q1800  . torsemide  60 mg Oral QAC breakfast  . umeclidinium bromide  1 puff Inhalation Daily   Continuous Infusions: . ceFEPime (MAXIPIME) IV 2 g (09/06/18 0230)  . [START ON 09/07/2018] vancomycin       LOS: 1 day    Time spent: 25 minutes spent in the coordination of care today.    Jonnie Finner, DO [facility] Pager [phone]  If 7PM-7AM, please contact night-coverage [email or web address] Password [facility] 09/06/2018, 11:37 AM

## 2018-09-06 NOTE — TOC Initial Note (Signed)
Transition of Care [facility]) - Initial/Assessment Note    Patient Details  Name: Crystal Yates MRN: [id number] Date of Birth: [birth year]  Transition of Care [facility]) CM/SW Contact:    Pollie Friar, RN Phone Number: 09/06/2018, 5:19 PM  Clinical Narrative:                 Pt is active with [facility] for [facility] PT. She is agreeable to adding an RN at d/c for DM.  She still drives or her spouse can provide needed transportation. She uses a medication machine to dispense her meds. She states it seems that the machine has not been working properly lately. It does alarm to remind her to take her medications.  Expected Discharge Plan: [place] Barriers to Discharge: Continued Medical Work up   Patient Goals and CMS Choice   CMS Medicare.gov Compare Post Acute Care list provided to:: Patient Choice offered to / list presented to : Patient  Expected Discharge Plan and Services Expected Discharge Plan: [place]   Discharge Planning Services: CM Consult Post Acute Care Choice: [place] arrangements for the past 2 months: Single Family Home                                      Prior Living Arrangements/Services Living arrangements for the past 2 months: Single Family Home Lives with:: Spouse Patient language and need for interpreter reviewed:: Yes(no needs) Do you feel safe going back to the place where you live?: Yes        Care giver support system in place?: No (comment)(spouse cant provide 24 hour supervision) Current home services: DME(shower seat, shower bars, 3 n 1, cane, walker, electric wheelchair, home oxygen through [facility] at 2L) Criminal Activity/Legal Involvement Pertinent to Current Situation/Hospitalization: No - Comment as needed  Activities of Daily Living Home Assistive Devices/Equipment: Built-in shower seat, Cane (specify quad or straight), Eyeglasses, Grab bars in shower ADL Screening (condition at  time of admission) Patient's cognitive ability adequate to safely complete daily activities?: Yes Is the patient deaf or have difficulty hearing?: No Does the patient have difficulty seeing, even when wearing glasses/contacts?: No Does the patient have difficulty concentrating, remembering, or making decisions?: No Patient able to express need for assistance with ADLs?: Yes Does the patient have difficulty dressing or bathing?: No Independently performs ADLs?: No Communication: Independent Dressing (OT): Needs assistance Is this a change from baseline?: Change from baseline, expected to last <3days Grooming: Independent Feeding: Independent Bathing: Needs assistance Is this a change from baseline?: Change from baseline, expected to last <3 days Toileting: Needs assistance Is this a change from baseline?: Change from baseline, expected to last <3 days In/Out Bed: Independent Walks in Home: Independent, Independent with device (comment) Does the patient have difficulty walking or climbing stairs?: Yes Weakness of Legs: Both Weakness of Arms/Hands: None  Permission Sought/Granted                  Emotional Assessment   Attitude/Demeanor/Rapport: Engaged Affect (typically observed): Accepting, Appropriate, Pleasant Orientation: : Oriented to Self, Oriented to Place, Oriented to  Time, Oriented to Situation   Psych Involvement: No (comment)  Admission diagnosis:  Shortness of breath [R06.02] Cough [R05] Patient Active Problem List   Diagnosis Date Noted  . Sepsis due to pneumonia ([place]) 09/05/2018  . Coagulation defect ([place]) 08/23/2018  .  Ventral hernia 08/06/2018  . Dyspnea 07/19/2018  . Acute diastolic CHF (congestive heart failure) ([place]) 07/19/2018  . Hypoglycemia 07/10/2018  . Abdominal pain 07/10/2018  . Alcohol use 07/10/2018  . Type II diabetes mellitus with renal manifestations ([place]) 07/10/2018  . CKD (chronic kidney disease) stage 3, GFR 30-59 ml/min (HCC)  07/10/2018  . Chronic combined systolic (congestive) and diastolic (congestive) heart failure ([place]) 07/10/2018  . COPD exacerbation ([place]) 07/10/2018  . Suspected Covid-19 Virus Infection 07/10/2018  . Chronic respiratory failure with hypoxia and hypercapnia ([place]) 02/01/2018  . Cigarette smoker 02/01/2018  . COPD  GOLD III/ c/b hypoxemia hypercarbia  01/31/2018  . Internal and external bleeding hemorrhoids 01/22/2018  . Sessile colonic polyp 01/22/2018  . Diverticulosis of colon without hemorrhage 01/22/2018  . Iron deficiency 01/11/2018  . Acute on chronic respiratory failure with hypoxia and hypercapnia ([place]) 01/04/2018  . AKI (acute kidney injury) ([place])   . Respiratory crackles at left lung base   . Morbid obesity ([place]) 12/29/2017  . Acute on chronic heart failure ([place]) 12/27/2017  . Hyperglycemia   . Coronary stent restenosis due to progression of disease   . Pseudoaneurysm following procedure ([place])   . Pseudoaneurysm ([place]) 10/17/2017  . S/P angioplasty for ISR of dRCA 10/13/17 10/14/2017  . CAD (coronary artery disease) 10/13/2017  . Right upper lobe pneumonia ([place]) 05/06/2017  . Pneumonia due to respiratory syncytial virus (RSV) 04/21/2017  . Entrapment, radial nerve, right 03/31/2017  . COPD without exacerbation ([place]) 10/14/2016  . Acute on chronic combined systolic and diastolic heart failure ([place]) 10/13/2016  . Chest tightness or pressure 10/12/2016  . Dyspnea on exertion 09/29/2016  . History of coronary artery disease 09/29/2016  . OAB (overactive bladder) 09/21/2016  . Neck pain 09/08/2016  . Paresthesia of arm 09/08/2016  . Flank pain 08/31/2016  . Neuralgia of right upper extremity 04/26/2016  . Coronary artery disease 04/16/2016  . Lymphocele after surgical procedure 04/16/2016  . Ventricular tachycardia (paroxysmal) ([place]) 04/09/2016  . Acute on chronic HFrEF (heart failure with reduced ejection fraction) ([place]) 04/08/2016  . Ischemic cardiomyopathy 04/08/2016  .  Anemia, blood loss 09/16/2015  . GI bleeding 09/16/2015  . Chest pain 05/22/2015  . Hypothyroid 05/22/2015  . Status post hernia repair 01/29/2015  . Surgical wound breakdown 01/29/2015  . Hernia with strangulation 01/18/2015  . Tobacco abuse 01/18/2015  . Recurrent incisional hernia with incarceration 01/15/2015  . Sleep apnea 01/15/2015  . S/P drug eluting coronary stent placement 10/16/2014  . Adrenal insufficiency ([place]) 11/21/2013  . Anemia 11/21/2013  . Unstable angina ([place]) 11/21/2013  . Atherosclerotic heart disease of native coronary artery without angina pectoris 11/21/2013  . Cardiomyopathy ([place]) 11/21/2013  . Edema 11/21/2013  . Empty sella syndrome ([place]) 11/21/2013  . Hepatitis 11/21/2013  . Hyperlipemia 11/21/2013  . Hypertension 11/21/2013  . Kidney cyst, acquired 11/21/2013  . Fat necrosis 11/21/2013  . Pulmonary emphysema ([place]) 11/21/2013  . Sjogrens syndrome ([place]) 11/21/2013  . Nicotine dependence, uncomplicated 09/32/3557  . Major depressive disorder, recurrent episode ([place]) 09/17/2013  . Displacement of lumbar intervertebral disc 06/14/2013  . Intractable migraine without aura 06/14/2013  . Mild cognitive impairment 06/14/2013  . Polyneuropathy in diabetes ([place]) 06/14/2013  . Secondary adrenal insufficiency ([place]) 04/12/2011  . DDD (degenerative disc disease), lumbosacral 01/11/2008  . Thoracic or lumbosacral neuritis or radiculitis 01/11/2008   PCP:  Dortha Kern, PA Pharmacy:   [facility] [place], [place] - [address] AT [place] [place] [address] Phone: [phone] Fax: [phone]     Social Determinants of Health (SDOH) Interventions    Readmission Risk Interventions Readmission Risk Prevention Plan 07/20/2018  Transportation Screening Complete  Medication Review Press photographer) Complete  Palliative Care Screening Not [place] Not Applicable

## 2018-09-06 NOTE — Progress Notes (Signed)
Inpatient Diabetes Program  AACE/ADA: New Consensus Statement on Inpatient Glycemic Control (2015)  Target Ranges:  Prepandial:   less than 140 mg/dL      Peak postprandial:   less than 180 mg/dL (1-2 hours)      Critically ill patients:  140 - 180 mg/dL     Spoke with patient at bedside in regards to her DM management at home. Patient discussed areas she needed improvement on. Patient has several autoimmune processes being managed by an Endocrinologist however her PCP manages her DM. Spoke with patient about her Endocrinologist taking over the DM management and for her to take her glucose 2 times a day (fasting and alternating second check) for more information for the doctor to be able to make adjustments based on glucose trends.  Thanks,  Tama Headings RN, MSN, BC-ADM Inpatient Diabetes Coordinator Team Pager [phone] (8a-5p)

## 2018-09-07 DIAGNOSIS — A419 Sepsis, unspecified organism: Secondary | ICD-10-CM | POA: Diagnosis not present

## 2018-09-07 DIAGNOSIS — J189 Pneumonia, unspecified organism: Secondary | ICD-10-CM | POA: Diagnosis not present

## 2018-09-07 LAB — CBC WITH DIFFERENTIAL/PLATELET
Abs Immature Granulocytes: 0.12 10*3/uL — ABNORMAL HIGH (ref 0.00–0.07)
Basophils Absolute: 0 10*3/uL (ref 0.0–0.1)
Basophils Relative: 0 %
Eosinophils Absolute: 0 10*3/uL (ref 0.0–0.5)
Eosinophils Relative: 0 %
HCT: 41.9 % (ref 36.0–46.0)
Hemoglobin: 13.2 g/dL (ref 12.0–15.0)
Immature Granulocytes: 1 %
Lymphocytes Relative: 6 %
Lymphs Abs: 1 10*3/uL (ref 0.7–4.0)
MCH: 29.3 pg (ref 26.0–34.0)
MCHC: 31.5 g/dL (ref 30.0–36.0)
MCV: 92.9 fL (ref 80.0–100.0)
Monocytes Absolute: 0.5 10*3/uL (ref 0.1–1.0)
Monocytes Relative: 3 %
Neutro Abs: 13.9 10*3/uL — ABNORMAL HIGH (ref 1.7–7.7)
Neutrophils Relative %: 90 %
Platelets: 201 10*3/uL (ref 150–400)
RBC: 4.51 MIL/uL (ref 3.87–5.11)
RDW: 13.5 % (ref 11.5–15.5)
WBC: 15.5 10*3/uL — ABNORMAL HIGH (ref 4.0–10.5)
nRBC: 0 % (ref 0.0–0.2)

## 2018-09-07 LAB — RENAL FUNCTION PANEL
Albumin: 2.6 g/dL — ABNORMAL LOW (ref 3.5–5.0)
Anion gap: 10 (ref 5–15)
BUN: 36 mg/dL — ABNORMAL HIGH (ref 8–23)
CO2: 32 mmol/L (ref 22–32)
Calcium: 8.9 mg/dL (ref 8.9–10.3)
Chloride: 100 mmol/L (ref 98–111)
Creatinine, Ser: 1.09 mg/dL — ABNORMAL HIGH (ref 0.44–1.00)
GFR calc Af Amer: >60 mL/min (ref >60)
GFR calc non Af Amer: 54 mL/min — ABNORMAL LOW (ref >60)
Glucose, Bld: 129 mg/dL — ABNORMAL HIGH (ref 70–99)
Phosphorus: 2.6 mg/dL (ref 2.5–4.6)
Potassium: 3.6 mmol/L (ref 3.5–5.1)
Sodium: 142 mmol/L (ref 135–145)

## 2018-09-07 LAB — GLUCOSE, CAPILLARY
Glucose-Capillary: 465 mg/dL — ABNORMAL HIGH (ref 70–99)
Glucose-Capillary: 266 mg/dL — ABNORMAL HIGH (ref 70–99)

## 2018-09-07 LAB — MAGNESIUM: Magnesium: 1.8 mg/dL (ref 1.7–2.4)

## 2018-09-07 MED ORDER — LEVOFLOXACIN 750 MG PO TABS: 750.0000 mg | ORAL_TABLET | Freq: Every day | ORAL | 0 refills | Status: AC

## 2018-09-07 NOTE — Discharge Summary (Signed)
. Physician Discharge Summary  Crystal Yates AXK:[id number] DOB: Oct 25, 1955 DOA: 09/05/2018  PCP: Dortha Kern, PA  Admit date: 09/05/2018 Discharge date: 09/07/2018  Admitted From: Home Disposition:  Discharged to home.  Recommendations for Outpatient Follow-up:  1. Follow up with PCP in 1-2 weeks 2. Please obtain BMP/CBC in one week     Discharge Condition: Stable  CODE STATUS: DNR   Brief/Interim Summary: Crystal Paglia Lafranceis a 63 y.o.femalewith medical history significant ofthyroid disease; secondary adrenal insufficiency; CAD s/p stenting; HTN; HLD; DM; COPD on 2L home O2; stage 2 CKD; and chronic combined CHF (4/24 echo with EF 40-45% and grade 1 diastolic dysfunction) presenting with SOB.Her breathing has been getting worse. She started feeling worse a few days ago. She is usually on 2L O2 and had to turn it up. She has had a cough productive of yellow sputum. "Mild" fever. She is not feeling better now and does still feel SOB.  Discharge Diagnoses:  Principal Problem:   Sepsis due to pneumonia [facility]) Active Problems:   Adrenal insufficiency (HCC)   Hypothyroid   Morbid obesity (HCC)   Acute on chronic respiratory failure with hypoxia and hypercapnia (HCC)   COPD  GOLD III/ c/b hypoxemia hypercarbia    Cigarette smoker   Type II diabetes mellitus with renal manifestations (HCC)   CKD (chronic kidney disease) stage 3, GFR 30-59 ml/min (HCC)   Chronic combined systolic (congestive) and diastolic (congestive) heart failure (HCC)  Sepsis due to PNA with acute on chronic respiratory failure associated with COPD     - SIRS criteria in this patient includes: Leukocytosis, tachycardia, tachypnea      - Patient has evidence of acute organ failure with lactic acidosis and worsening respiratory failure - usually on 2L home O2, currently requiring 4L     - While awaiting blood cultures, this appears to be a preseptic condition.     - Sepsis protocol  initiated     - Suspected source is LLL PNA, superimposed on COPD     - COVID negative     - The patient will need treatment for HCAP due to MDR risk factors as above; will treat with Cefepime and Vancomycin.     - Additional complicating factors include: associated pleural effusions; and immunocompromise.     - Sputum Cx pending, Bld Cx NGTD, step pneumo AG pending     - albuterol PRN     - Continue Incruse Ellipta     - will transition to levaquin 750mg  PO x 5 days  Adrenal insufficiency     - On chronic Cortef     - Will hold home dose and treat with stress-dosed steroids, hydrocortisone 50 mg IV q6h; taper as able  DM2 on long term insulin     - Recent A1c was 8.6 on 5/12     - She is on chronic steroids and will be receiving stress-dosed steroids here in the hospital     - takes [place] 42 units at home; steroids worsening DM; increase in-house Lantus to 30units BID     - Will cover with moderate-scale SSI including qhs for now     - patient states that she has difficulty with insulin administration; has requested DM counseling, DM education consulted; will also see what we can do as far as help outpt.     - resume home DM regimen at discharge; follow up with endocrinology at discharge  Stage 3 CKD     - Recent AKI  but appears to be at/near baseline at this time  Chronic combined CHF     - Recent Echo with EF 40-45% and grade 1 DD     - Appears to be compensated at this time     - demadex resumed  Hypothyroidism     - Recent normal TSH     - Continue Synthroid at current dose for now  Obesity     - BMI 40     - Outpatient bariatric medical or surgical referral should be considered  Ongoing tobacco dependence     - cessation discussed      - Patch ordered at patient request.  Discharge Instructions 1. Follow up with PCP in 1 week. 2. Follow up with Endocrinology in 1 week.  Allergies as of 09/07/2018      Reactions   Hydroxychloroquine Shortness Of Breath,  Nausea Only, Other (See Comments)   Dizziness   Donepezil Other (See Comments)   Dizziness, depression, and makes the patient feel "funny"   Prednisone Other (See Comments)   Causes depression and suicidal thoughts   Anticoagulant Cit Dext [acd Formula A] Other (See Comments)   Unknown   Bupropion Other (See Comments)   Suicidal thoughts   Metrizamide Other (See Comments)   (a non-ionic radiopaque contrast agent) "Blows the vein" and contrast gathers at the injected site's limb   Varenicline Other (See Comments)   Suicidal thoughts   Tape Rash, Other (See Comments)   Paper tape is preferred, PLEASE      Medication List    STOP taking these medications   doxycycline 100 MG tablet Commonly known as: VIBRA-TABS   methylPREDNISolone 8 MG tablet Commonly known as: Medrol     TAKE these medications   acetaminophen 500 MG tablet Commonly known as: TYLENOL Take 1 tablet (500 mg total) by mouth every 6 (six) hours as needed for mild pain.   albuterol 108 (90 Base) MCG/ACT inhaler Commonly known as: ProAir HFA Inhale 2 puffs into the lungs every 4-6 hours as needed for shortness of breath or wheezing   albuterol (2.5 MG/3ML) 0.083% nebulizer solution Commonly known as: PROVENTIL Take 3 mLs (2.5 mg total) by nebulization every 6 (six) hours as needed for wheezing or shortness of breath.   aspirin 81 MG EC tablet Take 1 tablet (81 mg total) by mouth daily.   atorvastatin 80 MG tablet Commonly known as: LIPITOR Take 80 mg by mouth every evening.   CALCIUM POLYCARBOPHIL PO Take 3 tablets by mouth daily.   clopidogrel 75 MG tablet Commonly known as: PLAVIX Take 1 tablet (75 mg total) by mouth daily. What changed: when to take this   DULoxetine 60 MG capsule Commonly known as: CYMBALTA Take 120 mg by mouth daily.   Fusion Plus Caps Take 1 capsule by mouth at bedtime.   hydrocortisone 5 MG tablet Commonly known as: CORTEF Take 5-15 mg by mouth See admin instructions.  Take 15 mg in the morning and 5 mg in the evening   levofloxacin 750 MG tablet Commonly known as: Levaquin Take 1 tablet (750 mg total) by mouth daily for 5 days.   levothyroxine 150 MCG tablet Commonly known as: SYNTHROID Take 150 mcg by mouth daily before breakfast.   MAGnesium-Oxide 400 (241.3 Mg) MG tablet Generic drug: magnesium oxide Take 400 mg by mouth daily.   nitroGLYCERIN 0.4 MG SL tablet Commonly known as: NITROSTAT Place 1 tablet (0.4 mg total) under the tongue every 5 (five) minutes as  needed for chest pain.   OXYGEN Place 2 L into the nose continuous.   polyethylene glycol 17 g packet Commonly known as: MIRALAX / GLYCOLAX Take 17 g by mouth daily. What changed:   when to take this  reasons to take this   potassium chloride SA 20 MEQ tablet Commonly known as: K-DUR Take 1 tablet (20 mEq total) by mouth daily. Take when you take Torsemide.   pramipexole 1 MG tablet Commonly known as: MIRAPEX Take 1 mg by mouth 2 (two) times a day.   Tiotropium Bromide-Olodaterol 2.5-2.5 MCG/ACT Aers Commonly known as: Stiolto Respimat Inhale 2 puffs into the lungs daily.   torsemide 20 MG tablet Commonly known as: DEMADEX Take 4 tablets (80 mg total) by mouth daily for 30 days. What changed:   how much to take  when to take this  additional instructions   Tresiba FlexTouch 100 UNIT/ML Sopn FlexTouch Pen Generic drug: insulin degludec Inject 42 Units into the skin at bedtime.   Trulicity 1.5 BH/4.1PF Sopn Generic drug: Dulaglutide Inject 1.5 mg into the skin every Sunday.   Vitamin D3 50 MCG (2000 UT) capsule Take 2,000 Units by mouth daily.   zaleplon 10 MG capsule Commonly known as: SONATA Take 10 mg by mouth at bedtime as needed for sleep.      Follow-up Information    Dortha Kern, PA In 2 days.   Specialty: Physician Assistant Why: As needed Contact information: [place] [place] [address] [phone]           Allergies  Allergen Reactions  . Hydroxychloroquine Shortness Of Breath, Nausea Only and Other (See Comments)    Dizziness  . Donepezil Other (See Comments)    Dizziness, depression, and makes the patient feel "funny"  . Prednisone Other (See Comments)    Causes depression and suicidal thoughts  . Anticoagulant Cit Dext [Acd Formula A] Other (See Comments)    Unknown  . Bupropion Other (See Comments)    Suicidal thoughts  . Metrizamide Other (See Comments)    (a non-ionic radiopaque contrast agent) "Blows the vein" and contrast gathers at the injected site's limb  . Varenicline Other (See Comments)    Suicidal thoughts  . Tape Rash and Other (See Comments)    Paper tape is preferred, PLEASE     Procedures/Studies: Dg Chest 1 View  Result Date: 09/05/2018 CLINICAL DATA:  Atypical shortness of breath and cough. EXAM: CHEST  1 VIEW COMPARISON:  One-view chest x-ray 08/06/2018 FINDINGS: The heart is mildly enlarged. Atherosclerotic calcifications are present. Small effusions are suspected. Bibasilar opacities are worse left than right. Upper lung fields are clear. Lungs remain hyperexpanded. Cervical fusion is noted. IMPRESSION: 1. Borderline cardiomegaly without failure. 2. Suspect pleural effusions, left greater than right. 3. Mild bibasilar airspace disease likely reflects atelectasis. Infection, particularly at the left base, is not excluded. Electronically Signed   By: San Morelle M.D.   On: 09/05/2018 06:23      Subjective: "I think i'm ready to go."  Discharge Exam: Vitals:   09/06/18 2156 09/07/18 0501  BP: (!) 114/57 (!) 114/94  Pulse: 87 91  Resp: 17 18  Temp: (!) 97.4 F (36.3 C) 97.7 F (36.5 C)  SpO2: 96% 99%   Vitals:   09/06/18 1142 09/06/18 1304 09/06/18 2156 09/07/18 0501  BP: 132/74 128/80 (!) 114/57 (!) 114/94  Pulse: 88 91 87 91  Resp: 18 18 17 18   Temp:   (!) 97.4 F (  36.3 C) 97.7 F (36.5 C)  TempSrc:   Oral Oral  SpO2: 98% 93% 96%  99%  Weight:      Height:        General: 63 y.o. female resting in bed in NAD Cardiovascular: RRR, +S1, S2, no m/g/r, equal pulses throughout Respiratory: improved exp wheeze, WOB at baseline GI: BS+, NDNT, no masses noted, no organomegaly noted, obese MSK: No c/c; BLE edema Skin: No rashes, bruises, ulcerations noted Neuro: A&O x 3, no focal deficits Psyc: Appropriate interaction and affect, calm/cooperative    The results of significant diagnostics from this hospitalization (including imaging, microbiology, ancillary and laboratory) are listed below for reference.     Microbiology: Recent Results (from the past 240 hour(s))  SARS Coronavirus 2     Status: None   Collection Time: 09/05/18  6:05 AM  Result Value Ref Range Status   SARS Coronavirus 2 NOT DETECTED NOT DETECTED Final    Comment: (NOTE) SARS-CoV-2 target nucleic acids are NOT DETECTED. The SARS-CoV-2 RNA is generally detectable in upper and lower respiratory specimens during the acute phase of infection.  Negative  results do not preclude SARS-CoV-2 infection, do not rule out co-infections with other pathogens, and should not be used as the sole basis for treatment or other patient management decisions.  Negative results must be combined with clinical observations, patient history, and epidemiological information. The expected result is Not Detected. Fact Sheet for Patients: http://www.biofiredefense.com/wp-content/uploads/2020/03/BIOFIRE-COVID -19-patients.pdf Fact Sheet for Healthcare Providers: http://www.biofiredefense.com/wp-content/uploads/2020/03/BIOFIRE-COVID -19-hcp.pdf This test is not yet approved or cleared by the [place] and  has been authorized for detection and/or diagnosis of SARS-CoV-2 by FDA under an Emergency Use Authorization (EUA).  This EUA will remain in effec t (meaning this test can be used) for the duration of  the COVID-19 declaration under Section 564(b)(1) of the Act,  21 U.S.C. section 360bbb-3(b)(1), unless the authorization is terminated or revoked sooner. Performed at [facility], [place] [address], [place], [place] [address]   Blood culture (routine x 2)     Status: None (Preliminary result)   Collection Time: 09/05/18  7:41 AM   Specimen: BLOOD  Result Value Ref Range Status   Specimen Description BLOOD RIGHT ANTECUBITAL  Final   Special Requests   Final    BOTTLES DRAWN AEROBIC AND ANAEROBIC Blood Culture results may not be optimal due to an excessive volume of blood received in culture bottles   Culture   Final    NO GROWTH 2 DAYS Performed at [facility], [place] [address], [place], [place] [address]    Report Status PENDING  Incomplete  Blood culture (routine x 2)     Status: None (Preliminary result)   Collection Time: 09/05/18  7:41 AM   Specimen: BLOOD  Result Value Ref Range Status   Specimen Description BLOOD BLOOD LEFT WRIST  Final   Special Requests   Final    BOTTLES DRAWN AEROBIC AND ANAEROBIC Blood Culture adequate volume   Culture   Final    NO GROWTH 2 DAYS Performed at [facility], [place] [address], [place], [place] [address]    Report Status PENDING  Incomplete  Culture, sputum-assessment     Status: None   Collection Time: 09/05/18  1:09 PM   Specimen: Sputum  Result Value Ref Range Status   Specimen Description SPUTUM  Final   Special Requests Normal  Final   Sputum evaluation   Final    THIS SPECIMEN  IS ACCEPTABLE FOR SPUTUM CULTURE Performed at [facility], [place] [address] [place], [place] [address]    Report Status 09/06/2018 FINAL  Final  Culture, respiratory     Status: None (Preliminary result)   Collection Time: 09/05/18  1:09 PM   Specimen: SPU  Result Value Ref Range Status   Specimen Description SPUTUM  Final   Special Requests Normal Reflexed from [id number]  Final   Gram Stain   Final    FEW WBC PRESENT, PREDOMINANTLY PMN RARE SQUAMOUS EPITHELIAL CELLS PRESENT FEW GRAM POSITIVE  RODS RARE BUDDING YEAST SEEN    Culture   Final    CULTURE REINCUBATED FOR BETTER GROWTH Performed at [facility], [place] [address], [place], [place] [address]    Report Status PENDING  Incomplete     Labs: BNP (last 3 results) Recent Labs    07/19/18 2058 08/06/18 0331 09/05/18 0602  BNP 671.6* 160.3* 735.3*   Basic Metabolic Panel: Recent Labs  Lab 09/05/18 0544 09/06/18 0658 09/07/18 0458  NA 137 137 142  K 3.5 4.6 3.6  CL 90* 97* 100  CO2 35* 31 32  GLUCOSE 258* 438* 129*  BUN 32* 34* 36*  CREATININE 1.25* 1.40* 1.09*  CALCIUM 9.6 8.9 8.9  MG  --  1.9 1.8  PHOS  --   --  2.6   Liver Function Tests: Recent Labs  Lab 09/05/18 0544 09/07/18 0458  AST 22  --   ALT 18  --   ALKPHOS 99  --   BILITOT 0.5  --   PROT 6.7  --   ALBUMIN 3.1* 2.6*   No results for input(s): LIPASE, AMYLASE in the last 168 hours. No results for input(s): AMMONIA in the last 168 hours. CBC: Recent Labs  Lab 09/05/18 0542 09/06/18 0658 09/07/18 0458  WBC 16.7* 12.3* 15.5*  NEUTROABS  --  11.1* 13.9*  HGB 14.3 13.3 13.2  HCT 44.3 41.6 41.9  MCV 91.9 93.1 92.9  PLT 229 203 201   Cardiac Enzymes: Recent Labs  Lab 09/05/18 0544 09/05/18 0948 09/05/18 1932 09/06/18 0118 09/06/18 0658  TROPONINI 0.05* 0.05* 0.04* 0.03* 0.03*   BNP: Invalid input(s): POCBNP CBG: Recent Labs  Lab 09/06/18 1529 09/06/18 1619 09/06/18 2155 09/07/18 0851 09/07/18 1206  GLUCAP 57* 122* 346* 465* 266*   D-Dimer No results for input(s): DDIMER in the last 72 hours. Hgb A1c No results for input(s): HGBA1C in the last 72 hours. Lipid Profile No results for input(s): CHOL, HDL, LDLCALC, TRIG, CHOLHDL, LDLDIRECT in the last 72 hours. Thyroid function studies No results for input(s): TSH, T4TOTAL, T3FREE, THYROIDAB in the last 72 hours.  Invalid input(s): FREET3 Anemia work up No results for input(s): VITAMINB12, FOLATE, FERRITIN, TIBC, IRON, RETICCTPCT in the last 72  hours. Urinalysis    Component Value Date/Time   COLORURINE YELLOW 09/05/2018 1235   APPEARANCEUR HAZY (A) 09/05/2018 1235   LABSPEC 1.012 09/05/2018 1235   PHURINE 5.0 09/05/2018 1235   GLUCOSEU NEGATIVE 09/05/2018 1235   HGBUR NEGATIVE 09/05/2018 1235   BILIRUBINUR NEGATIVE 09/05/2018 1235   KETONESUR NEGATIVE 09/05/2018 1235   PROTEINUR NEGATIVE 09/05/2018 1235   NITRITE NEGATIVE 09/05/2018 1235   LEUKOCYTESUR SMALL (A) 09/05/2018 1235   Sepsis Labs Invalid input(s): PROCALCITONIN,  WBC,  LACTICIDVEN Microbiology Recent Results (from the past 240 hour(s))  SARS Coronavirus 2     Status: None   Collection Time: 09/05/18  6:05 AM  Result Value Ref Range Status  SARS Coronavirus 2 NOT DETECTED NOT DETECTED Final    Comment: (NOTE) SARS-CoV-2 target nucleic acids are NOT DETECTED. The SARS-CoV-2 RNA is generally detectable in upper and lower respiratory specimens during the acute phase of infection.  Negative  results do not preclude SARS-CoV-2 infection, do not rule out co-infections with other pathogens, and should not be used as the sole basis for treatment or other patient management decisions.  Negative results must be combined with clinical observations, patient history, and epidemiological information. The expected result is Not Detected. Fact Sheet for Patients: http://www.biofiredefense.com/wp-content/uploads/2020/03/BIOFIRE-COVID -19-patients.pdf Fact Sheet for Healthcare Providers: http://www.biofiredefense.com/wp-content/uploads/2020/03/BIOFIRE-COVID -19-hcp.pdf This test is not yet approved or cleared by the [place] and  has been authorized for detection and/or diagnosis of SARS-CoV-2 by FDA under an Emergency Use Authorization (EUA).  This EUA will remain in effec t (meaning this test can be used) for the duration of  the COVID-19 declaration under Section 564(b)(1) of the Act, 21 U.S.C. section 360bbb-3(b)(1), unless the authorization  is terminated or revoked sooner. Performed at [facility], [place] [address] [place], [place] [address]   Blood culture (routine x 2)     Status: None (Preliminary result)   Collection Time: 09/05/18  7:41 AM   Specimen: BLOOD  Result Value Ref Range Status   Specimen Description BLOOD RIGHT ANTECUBITAL  Final   Special Requests   Final    BOTTLES DRAWN AEROBIC AND ANAEROBIC Blood Culture results may not be optimal due to an excessive volume of blood received in culture bottles   Culture   Final    NO GROWTH 2 DAYS Performed at [facility], [place] [address], [place], [place] [address]    Report Status PENDING  Incomplete  Blood culture (routine x 2)     Status: None (Preliminary result)   Collection Time: 09/05/18  7:41 AM   Specimen: BLOOD  Result Value Ref Range Status   Specimen Description BLOOD BLOOD LEFT WRIST  Final   Special Requests   Final    BOTTLES DRAWN AEROBIC AND ANAEROBIC Blood Culture adequate volume   Culture   Final    NO GROWTH 2 DAYS Performed at [facility], [place] [address] [place], [place] [address]    Report Status PENDING  Incomplete  Culture, sputum-assessment     Status: None   Collection Time: 09/05/18  1:09 PM   Specimen: Sputum  Result Value Ref Range Status   Specimen Description SPUTUM  Final   Special Requests Normal  Final   Sputum evaluation   Final    THIS SPECIMEN IS ACCEPTABLE FOR SPUTUM CULTURE Performed at [facility], [address], [place], [place] [address]    Report Status 09/06/2018 FINAL  Final  Culture, respiratory     Status: None (Preliminary result)   Collection Time: 09/05/18  1:09 PM   Specimen: SPU  Result Value Ref Range Status   Specimen Description SPUTUM  Final   Special Requests Normal Reflexed from [id number]  Final   Gram Stain   Final    FEW WBC PRESENT, PREDOMINANTLY PMN RARE SQUAMOUS EPITHELIAL CELLS PRESENT FEW GRAM POSITIVE RODS RARE BUDDING YEAST SEEN    Culture   Final     CULTURE REINCUBATED FOR BETTER GROWTH Performed at [facility], [place] [address] [place],  [address]    Report Status PENDING  Incomplete     Time coordinating discharge: 35 minutes  SIGNED:   Jiles Prows A  Marylyn Ishihara, DO  [facility] 09/07/2018, 12:33 PM Pager   If 7PM-7AM, please contact night-coverage [email or web address] Password [email or web address]

## 2018-09-07 NOTE — Plan of Care (Signed)
Care plan goals met.

## 2018-09-07 NOTE — Progress Notes (Signed)
Inpatient Diabetes Program Recommendations  AACE/ADA: New Consensus Statement on Inpatient Glycemic Control (2015)  Target Ranges:  Prepandial:   less than 140 mg/dL      Peak postprandial:   less than 180 mg/dL (1-2 hours)      Critically ill patients:  140 - 180 mg/dL   Results for [name], Crystal Yates (MRN [id number]) as of 09/07/2018 11:39  Ref. Range 09/05/2018 05:47 09/05/2018 22:20 09/06/2018 08:21 09/06/2018 11:59 09/06/2018 15:29 09/06/2018 16:19 09/06/2018 21:55 09/07/2018 08:51  Glucose-Capillary Latest Ref Range: 70 - 99 mg/dL 261 (H) 356 (H) 441 (H) 337 (H) 57 (L) unsure of acuracy 122 (H) 346 (H) 465 (H)   Review of Glycemic Control  Diabetes history: DM 2 Outpatient Diabetes medications: Tresiba 42 units, Trulicity 1.5 mg QSunday Current orders for Inpatient glycemic control: Lantus 30 units bid, Novolog 0-15 units tid, Novolog 0-5 units qhs  Inpatient Diabetes Program Recommendations:    Patient now on Solucortef 50 mg Q12 hours.  Patient ordered Lantus 30 units BID.   Glucose of 465 this am is after meal consumption, consider Novolog 8 units tid meal coverage if trends continue to be elevated (given if patient consumes at least 50% of meals).   Thanks,  Tama Headings RN, MSN, BC-ADM Inpatient Diabetes Coordinator Team Pager [phone] (8a-5p)

## 2018-09-07 NOTE — TOC Transition Note (Signed)
Transition of Care [facility]) - CM/SW Discharge Note   Patient Details  Name: Crystal Yates MRN: [id number] Date of Birth: [birth year]  Transition of Care [facility]) CM/SW Contact:  Pollie Friar, RN Phone Number: 09/07/2018, 2:12 PM   Clinical Narrative:    Pt discharging home with resumption of [place] services. [facility] aware of admission and d/c.  Pt's family to provide transport home.   Final next level of care: Home w Home Health Services Barriers to Discharge: No Barriers Identified   Patient Goals and CMS Choice   CMS Medicare.gov Compare Post Acute Care list provided to:: Patient Choice offered to / list presented to : Patient  Discharge Placement                       Discharge Plan and Services   Discharge Planning Services: CM Consult Post Acute Care Choice: Resumption of Svcs/PTA Provider, Home Health                    HH Arranged: RN, PT [facility] Agency: [place] ([facility]) Date [place]: 09/07/18 Time [place]: 1256 Representative spoke with at [place]: [place] ([place]) Interventions     Readmission Risk Interventions Readmission Risk Prevention Plan 09/06/2018 07/20/2018  Transportation Screening Complete Complete  Medication Review Press photographer) Referral to Pharmacy Complete  HRI or Home Care Consult Complete -  SW Recovery Care/Counseling Consult Not Complete -  SW Consult Not Complete Comments no needs -  Palliative Care Screening Not Applicable Not [place] Not Applicable Not Applicable

## 2018-09-07 NOTE — Progress Notes (Signed)
Patient was stable at discharge. I removed their IV. We reviewed the discharge education. Patient/Family verbalized understanding and had no further questions. Patient left with belongings in hand.

## 2018-09-07 NOTE — Consult Note (Signed)
   [facility] CM Inpatient Consult   09/07/2018  [name] [birth year] [id number]    Patient wasreviewed for readmission within 30 days, with 4 hospitalizations and 2 ED visits in the past 6 months underher [employer].  Chart review and MD narrative note dated 09/06/18 states as follows: Harlene Petralia Lafranceis a 63 y.o.femalewith medical history significant ofthyroid disease; secondary adrenal insufficiency; CAD s/p stenting; HTN; HLD; DM; COPD on 2L home O2; stage 2 CKD; and chronic combined CHF (4/24 echo with EF 40-45% and grade 1 diastolic dysfunction) presenting with SOB.Her breathing has been getting worse. She started feeling worse a few days ago. She is usually on 2L O2 and had to turn it up. She has had a cough productive of yellow sputum. "Mild" fever. She is not feeling better now and does still feel SOB. COVID negative. (Sepsis due to pneumonia with acute on chronic respiratory failure associated with COPD)  Per transition of care CM note shows disposition as: discharging to home with resumption of home health services. [facility] RN/ PT.  Patient's listed primary care provider is Dortha Kern, [place] with [facility] which is not currently a beneficiary of the attributed [place] in the [employer]. Reason:Her current primary care provider is not a [facility] provider and not affiliated with [employer]  This patient iscurrentlyNoteligible for [facility] Care Management Services.   For questions, please contact:  Edwena Felty A. Karra Pink, BSN, RN-BC [facility] Liaison Cell: [phone]

## 2018-09-08 DIAGNOSIS — I255 Ischemic cardiomyopathy: Secondary | ICD-10-CM | POA: Diagnosis not present

## 2018-09-08 DIAGNOSIS — I700 Atherosclerosis of aorta: Secondary | ICD-10-CM | POA: Diagnosis not present

## 2018-09-08 DIAGNOSIS — I130 Hypertensive heart and chronic kidney disease with heart failure and stage 1 through stage 4 chronic kidney disease, or unspecified chronic kidney disease: Secondary | ICD-10-CM | POA: Diagnosis not present

## 2018-09-08 DIAGNOSIS — J438 Other emphysema: Secondary | ICD-10-CM | POA: Diagnosis not present

## 2018-09-08 DIAGNOSIS — I5042 Chronic combined systolic (congestive) and diastolic (congestive) heart failure: Secondary | ICD-10-CM | POA: Diagnosis not present

## 2018-09-08 DIAGNOSIS — J9621 Acute and chronic respiratory failure with hypoxia: Secondary | ICD-10-CM | POA: Diagnosis not present

## 2018-09-08 DIAGNOSIS — I252 Old myocardial infarction: Secondary | ICD-10-CM | POA: Diagnosis not present

## 2018-09-08 DIAGNOSIS — D631 Anemia in chronic kidney disease: Secondary | ICD-10-CM | POA: Diagnosis not present

## 2018-09-08 DIAGNOSIS — J9622 Acute and chronic respiratory failure with hypercapnia: Secondary | ICD-10-CM | POA: Diagnosis not present

## 2018-09-08 DIAGNOSIS — F17210 Nicotine dependence, cigarettes, uncomplicated: Secondary | ICD-10-CM | POA: Diagnosis not present

## 2018-09-08 DIAGNOSIS — N183 Chronic kidney disease, stage 3 (moderate): Secondary | ICD-10-CM | POA: Diagnosis not present

## 2018-09-08 DIAGNOSIS — E1122 Type 2 diabetes mellitus with diabetic chronic kidney disease: Secondary | ICD-10-CM | POA: Diagnosis not present

## 2018-09-08 DIAGNOSIS — I25110 Atherosclerotic heart disease of native coronary artery with unstable angina pectoris: Secondary | ICD-10-CM | POA: Diagnosis not present

## 2018-09-08 LAB — CULTURE, RESPIRATORY W GRAM STAIN
Culture: NORMAL
Special Requests: NORMAL

## 2018-09-10 ENCOUNTER — Telehealth: Payer: Self-pay | Admitting: Internal Medicine

## 2018-09-10 DIAGNOSIS — J189 Pneumonia, unspecified organism: Secondary | ICD-10-CM | POA: Diagnosis not present

## 2018-09-10 DIAGNOSIS — J9621 Acute and chronic respiratory failure with hypoxia: Secondary | ICD-10-CM | POA: Diagnosis not present

## 2018-09-10 DIAGNOSIS — A419 Sepsis, unspecified organism: Secondary | ICD-10-CM | POA: Diagnosis not present

## 2018-09-10 NOTE — Telephone Encounter (Signed)
Left message for patient to call back  

## 2018-09-10 NOTE — Telephone Encounter (Signed)
Called and spoke with pt letting her know the information stated by MW in regards to the levaquin and stated to her to stop abx and call us by Wed/Thurs to let us know how she is doing and we could then check with MW to seen if any further abx need to be prescribed. Pt expressed understanding. Nothing further needed.

## 2018-09-10 NOTE — Telephone Encounter (Signed)
levaquin can cause aches in tendons espectially ankles / achilles and if that's what hurting should stop it  And see [place] in a couple of days to regroup and see if any further abx indicated

## 2018-09-10 NOTE — Telephone Encounter (Signed)
MW pt discharged from hospital on Levaquin 750 x 5 days. She has 3 day left. Pt states she had a fever 100.2 last night. She took tylenol. No fever this am. She says breathing doing wellon 2 liters. She is c/o body pain and shaky legs. Pt wants to know if she is having a reaction to levaquin?   Discharge Diagnoses:  Principal Problem:   Sepsis due to pneumonia [facility]) Active Problems:   Adrenal insufficiency (HCC)   Hypothyroid   Morbid obesity (HCC)   Acute on chronic respiratory failure with hypoxia and hypercapnia (HCC)   COPD  GOLD III/ c/b hypoxemia hypercarbia    Cigarette smoker   Type II diabetes mellitus with renal manifestations (HCC)   CKD (chronic kidney disease) stage 3, GFR 30-59 ml/min (HCC)   Chronic combined systolic (congestive) and diastolic (congestive) heart failure (HCC)  Sepsis due to PNA with acute on chronic respiratory failure associated with COPD - SIRS criteria in this patient includes: Leukocytosis, tachycardia, tachypnea  - Patient has evidence of acute organ failure with lactic acidosis and worsening respiratory failure - usually on 2L home O2, currently requiring 4L - While awaiting blood cultures, this appears to be a preseptic condition. - Sepsis protocol initiated - Suspected source is LLL PNA, superimposed on COPD - COVID negative - The patient will need treatment for HCAP due to MDR risk factors as above; will treat with Cefepime and Vancomycin. - Additional complicating factors include: associated pleural effusions; and immunocompromise. - Sputum Cx pending, Bld Cx NGTD, step pneumo AG pending - albuterol PRN - Continue Incruse Ellipta     - will transition to levaquin 750mg  PO x 5 days  Adrenal insufficiency - On chronic Cortef - Will hold home dose and treat with stress-dosed steroids, hydrocortisone 50 mg IV q6h; taper as able  DM2 on long term insulin - Recent A1c was 8.6 on  5/12 - She is on chronic steroids and will be receiving stress-dosed steroids here in the hospital - takes [place] 42 units at home; steroids worsening DM; increase in-house Lantus to 30units BID - Will cover with moderate-scale SSI including qhs for now - patient states that she has difficulty with insulin administration; has requested DM counseling, DM education consulted; will also see what we can do as far as help outpt.     - resume home DM regimen at discharge; follow up with endocrinology at discharge  Stage 3 CKD - Recent AKI but appears to be at/near baseline at this time  Chronic combined CHF - Recent Echo with EF 40-45% and grade 1 DD - Appears to be compensated at this time - demadex resumed  Hypothyroidism - Recent normal TSH - Continue Synthroid at current dose for now  Obesity - BMI 40 - Outpatient bariatric medical or surgical referral should be considered  Ongoing tobacco dependence - cessation discussed  - Patch ordered at patient request.  Discharge Instructions 1. Follow up with PCP in 1 week. 2. Follow up with Endocrinology in 1 week.       Allergies as of 09/07/2018

## 2018-09-10 NOTE — Telephone Encounter (Signed)
Pt returning call.  [phone]

## 2018-09-11 DIAGNOSIS — J9621 Acute and chronic respiratory failure with hypoxia: Secondary | ICD-10-CM | POA: Diagnosis not present

## 2018-09-11 DIAGNOSIS — I130 Hypertensive heart and chronic kidney disease with heart failure and stage 1 through stage 4 chronic kidney disease, or unspecified chronic kidney disease: Secondary | ICD-10-CM | POA: Diagnosis not present

## 2018-09-11 DIAGNOSIS — J9622 Acute and chronic respiratory failure with hypercapnia: Secondary | ICD-10-CM | POA: Diagnosis not present

## 2018-09-11 DIAGNOSIS — I700 Atherosclerosis of aorta: Secondary | ICD-10-CM | POA: Diagnosis not present

## 2018-09-11 DIAGNOSIS — I5042 Chronic combined systolic (congestive) and diastolic (congestive) heart failure: Secondary | ICD-10-CM | POA: Diagnosis not present

## 2018-09-11 DIAGNOSIS — E1122 Type 2 diabetes mellitus with diabetic chronic kidney disease: Secondary | ICD-10-CM | POA: Diagnosis not present

## 2018-09-11 DIAGNOSIS — J438 Other emphysema: Secondary | ICD-10-CM | POA: Diagnosis not present

## 2018-09-11 DIAGNOSIS — I255 Ischemic cardiomyopathy: Secondary | ICD-10-CM | POA: Diagnosis not present

## 2018-09-11 DIAGNOSIS — D631 Anemia in chronic kidney disease: Secondary | ICD-10-CM | POA: Diagnosis not present

## 2018-09-11 DIAGNOSIS — I252 Old myocardial infarction: Secondary | ICD-10-CM | POA: Diagnosis not present

## 2018-09-11 DIAGNOSIS — F17210 Nicotine dependence, cigarettes, uncomplicated: Secondary | ICD-10-CM | POA: Diagnosis not present

## 2018-09-11 DIAGNOSIS — I25110 Atherosclerotic heart disease of native coronary artery with unstable angina pectoris: Secondary | ICD-10-CM | POA: Diagnosis not present

## 2018-09-11 DIAGNOSIS — N183 Chronic kidney disease, stage 3 (moderate): Secondary | ICD-10-CM | POA: Diagnosis not present

## 2018-09-11 LAB — CULTURE, BLOOD (ROUTINE X 2)
Culture: NO GROWTH
Culture: NO GROWTH
Special Requests: ADEQUATE

## 2018-09-12 ENCOUNTER — Other Ambulatory Visit: Payer: Self-pay

## 2018-09-12 ENCOUNTER — Encounter: Payer: Self-pay | Admitting: Nurse Practitioner

## 2018-09-12 ENCOUNTER — Other Ambulatory Visit: Payer: Self-pay | Admitting: General Surgery

## 2018-09-12 ENCOUNTER — Ambulatory Visit (INDEPENDENT_AMBULATORY_CARE_PROVIDER_SITE_OTHER): Payer: Federal, State, Local not specified - PPO | Admitting: Nurse Practitioner

## 2018-09-12 ENCOUNTER — Telehealth: Payer: Self-pay | Admitting: Internal Medicine

## 2018-09-12 DIAGNOSIS — J189 Pneumonia, unspecified organism: Secondary | ICD-10-CM

## 2018-09-12 DIAGNOSIS — I700 Atherosclerosis of aorta: Secondary | ICD-10-CM | POA: Diagnosis not present

## 2018-09-12 DIAGNOSIS — I130 Hypertensive heart and chronic kidney disease with heart failure and stage 1 through stage 4 chronic kidney disease, or unspecified chronic kidney disease: Secondary | ICD-10-CM | POA: Diagnosis not present

## 2018-09-12 DIAGNOSIS — D631 Anemia in chronic kidney disease: Secondary | ICD-10-CM | POA: Diagnosis not present

## 2018-09-12 DIAGNOSIS — N183 Chronic kidney disease, stage 3 (moderate): Secondary | ICD-10-CM | POA: Diagnosis not present

## 2018-09-12 DIAGNOSIS — E1122 Type 2 diabetes mellitus with diabetic chronic kidney disease: Secondary | ICD-10-CM | POA: Diagnosis not present

## 2018-09-12 DIAGNOSIS — J9622 Acute and chronic respiratory failure with hypercapnia: Secondary | ICD-10-CM | POA: Diagnosis not present

## 2018-09-12 DIAGNOSIS — I5042 Chronic combined systolic (congestive) and diastolic (congestive) heart failure: Secondary | ICD-10-CM | POA: Diagnosis not present

## 2018-09-12 DIAGNOSIS — A419 Sepsis, unspecified organism: Secondary | ICD-10-CM | POA: Diagnosis not present

## 2018-09-12 DIAGNOSIS — J438 Other emphysema: Secondary | ICD-10-CM | POA: Diagnosis not present

## 2018-09-12 DIAGNOSIS — J9621 Acute and chronic respiratory failure with hypoxia: Secondary | ICD-10-CM | POA: Diagnosis not present

## 2018-09-12 DIAGNOSIS — F17210 Nicotine dependence, cigarettes, uncomplicated: Secondary | ICD-10-CM | POA: Diagnosis not present

## 2018-09-12 DIAGNOSIS — I25110 Atherosclerotic heart disease of native coronary artery with unstable angina pectoris: Secondary | ICD-10-CM | POA: Diagnosis not present

## 2018-09-12 DIAGNOSIS — I255 Ischemic cardiomyopathy: Secondary | ICD-10-CM | POA: Diagnosis not present

## 2018-09-12 DIAGNOSIS — I252 Old myocardial infarction: Secondary | ICD-10-CM | POA: Diagnosis not present

## 2018-09-12 MED ORDER — AMOXICILLIN-POT CLAVULANATE 875-125 MG PO TABS: 1.0000 | ORAL_TABLET | Freq: Two times a day (BID) | ORAL | 0 refills | Status: AC

## 2018-09-12 MED ORDER — AMOXICILLIN-POT CLAVULANATE 875-125 MG PO TABS: 1.0000 | ORAL_TABLET | Freq: Two times a day (BID) | ORAL | 0 refills | Status: DC

## 2018-09-12 NOTE — Telephone Encounter (Signed)
Called and spoke with pt who stated two days ago she began having complaints of shaky legs and body pain. Pt also has complaints of SOB and cough with yellow phlegm.  Pt denies any current complaints of fever but 2 nights ago she had a temp of 100.2.  Pt denies any complaints of wheezing, headache, diarrhea, abdominal pain, or sore throat.  Pt has been taking tylenol for the pain which she said has helped some. Pt has had to use rescue inhaler once to help with her symptoms and has not had to use her nebulizer. Pt is also taking stiolto as prescribed.  Pt wants recommendations to help with her symptoms. Dr. Melvyn Novas, please advise on this for pt. Thanks!

## 2018-09-12 NOTE — Telephone Encounter (Signed)
televisit with np to decided whether to send to er for covid testing or just quarantine at home and rx as aecopd

## 2018-09-12 NOTE — Patient Instructions (Addendum)
Will order augmentin  Please quit smoking  Continue all other medications:  Plan A = Automatic = stioloto 2 pffs each am   Work on inhaler technique: relax and gently blow all the way out then take a nice smooth deep breath back in, triggering the inhaler at same time you start breathing in. Hold for up to 5 seconds if you can. Rinse and gargle with water when done.   Plan B = Backup Only use your albuterol inhaler as a rescue medication to be used if you can't catch your breath by resting or doing a relaxed purse lip breathing pattern.  - The less you use it, the better it will work when you need it. - Ok to use the inhaler up to 2 puffs every 4 hours if you must but call for appointment if use goes up over your usual need - Don't leave home without it !! (think of it like the spare tire for your car)   Plan C = Crisis - only use your albuterol nebulizer if you first try Plan B and it fails to help >ok to use the nebulizer up to every 4 hours but if start needing it regularly call for immediate appointment  Follow up: Follow up with Dr. Melvyn Novas as scheduled or sooner if needed

## 2018-09-12 NOTE — Telephone Encounter (Signed)
Returned call to patient and advised need for phone visit.  Patient states she is able to use mychart video.  Visit scheduled for 1130am today with Eduardo Osier, NP.  Patient acknowledged understanding and had no further questions.  Encounter closed.

## 2018-09-12 NOTE — Assessment & Plan Note (Signed)
She was admitted to the hospital on 09/05/2018 and discharged on 09/07/2018.  While in the hospital patient was found to have sepsis due to pneumonia with acute on chronic respiratory failure.  Was discharged home on Levaquin but only took 3 doses.  She states that she could not tolerate Levaquin and developed muscle aches and stopped taking the medication.  She has been running a low-grade fever since her discharge home from the hospital.  He states that her temperature was 100.6 F.  She does still complain of cough with yellow sputum and shortness of breath.  She was tested for COVID in the hospital and was negative.  Patient is compliant with Stiolto.  Patient is on O2 continuously and is at her baseline of 2 L nasal cannula.  She states that her O2 sats have been stable.  Patient Instructions  Will order augmentin  Please quit smoking  Continue all other medications:  Plan A = Automatic = stioloto 2 pffs each am   Work on inhaler technique: relax and gently blow all the way out then take a nice smooth deep breath back in, triggering the inhaler at same time you start breathing in. Hold for up to 5 seconds if you can. Rinse and gargle with water when done.   Plan B = Backup Only use your albuterol inhaler as a rescue medication to be used if you can't catch your breath by resting or doing a relaxed purse lip breathing pattern.  - The less you use it, the better it will work when you need it. - Ok to use the inhaler up to 2 puffs every 4 hours if you must but call for appointment if use goes up over your usual need - Don't leave home without it !! (think of it like the spare tire for your car)   Plan C = Crisis - only use your albuterol nebulizer if you first try Plan B and it fails to help >ok to use the nebulizer up to every 4 hours but if start needing it regularly call for immediate appointment  Follow up: Follow up with Dr. Melvyn Novas as scheduled or sooner if needed

## 2018-09-12 NOTE — Progress Notes (Signed)
Virtual Visit via Telephone Note  I connected with Meghanne Pletz Giles on 09/12/18 at 11:30 AM EDT by telephone and verified that I am speaking with the correct person using two identifiers.  Location: Patient: home Provider: office   I discussed the limitations, risks, security and privacy concerns of performing an evaluation and management service by telephone and the availability of in person appointments. I also discussed with the patient that there may be a patient responsible charge related to this service. The patient expressed understanding and agreed to proceed.   History of Present Illness: 63 year old female active smoker with COPD gold III, chronic respiratory failure with hypoxia and hypercapnia who is followed by Dr. Melvyn Novas.  Patient has a tele-visit today for hospital follow-up.  She was admitted to the hospital on 09/05/2018 and discharged on 09/07/2018.  While in the hospital patient was found to have sepsis due to pneumonia with acute on chronic respiratory failure.  Was discharged home on Levaquin but only took 3 doses.  She states that she could not tolerate Levaquin and developed muscle aches and stopped taking the medication.  She has been running a low-grade fever since her discharge home from the hospital.  He states that her temperature was 100.6 F.  She does still complain of cough with yellow sputum and shortness of breath.  She was tested for COVID in the hospital and was negative.  Patient is compliant with Stiolto.  Patient is on O2 continuously and is at her baseline of 2 L nasal cannula.  She states that her O2 sats have been stable. Denies  n/v/d, hemoptysis, PND, leg swelling.     Observations/Objective: Active smoker Spirometry 01/31/2018  FEV1 0.9 (39%)  Ratio 63 p am  trelegy - 01/31/2018  After extensive coaching inhaler device,  effectiveness =    75% (short ti) on smi > try stiolto x 2 week sample > improved sltly 02/28/2018  - alpha one AT screening  02/28/2018   MM  Level 148  - Spirometry 04/20/2018  FEV1 1.1 (46%)  Ratio 67  Off rx  - 08/23/2018  After extensive coaching inhaler device,  effectiveness =    75% (short Ti, inconsistent trigger with onset of insp)  - 08/23/2018 added Medrol 8 mg x 4 day taper for flares  -CXR 09/05/18 -  Borderline cardiomegaly without failure. Suspect pleural effusions, left greater than right. Mild bibasilar airspace disease likely reflects atelectasis. Infection, particularly at the left base, is not excluded.  Assessment and Plan: She was admitted to the hospital on 09/05/2018 and discharged on 09/07/2018.  While in the hospital patient was found to have sepsis due to pneumonia with acute on chronic respiratory failure.  Was discharged home on Levaquin but only took 3 doses.  She states that she could not tolerate Levaquin and developed muscle aches and stopped taking the medication.  She has been running a low-grade fever since her discharge home from the hospital.  He states that her temperature was 100.6 F.  She does still complain of cough with yellow sputum and shortness of breath.  She was tested for COVID in the hospital and was negative.  Patient is compliant with Stiolto.  Patient is on O2 continuously and is at her baseline of 2 L nasal cannula.  She states that her O2 sats have been stable.  Patient Instructions  Will order augmentin  Please quit smoking  Continue all other medications:  Plan A = Automatic = stioloto 2 pffs each am  Work on inhaler technique: relax and gently blow all the way out then take a nice smooth deep breath back in, triggering the inhaler at same time you start breathing in. Hold for up to 5 seconds if you can. Rinse and gargle with water when done.   Plan B = Backup Only use your albuterol inhaler as a rescue medication to be used if you can't catch your breath by resting or doing a relaxed purse lip breathing pattern.  - The less you use it, the better it will  work when you need it. - Ok to use the inhaler up to 2 puffs every 4 hours if you must but call for appointment if use goes up over your usual need - Don't leave home without it !! (think of it like the spare tire for your car)   Plan C = Crisis - only use your albuterol nebulizer if you first try Plan B and it fails to help >ok to use the nebulizer up to every 4 hours but if start needing it regularly call for immediate appointment  Follow up: Follow up with Dr. Melvyn Novas as scheduled or sooner if needed      Follow Up Instructions: Follow up with Dr. Melvyn Novas as scheduled or sooner if needed - will need chest x ray before visit   I discussed the assessment and treatment plan with the patient. The patient was provided an opportunity to ask questions and all were answered. The patient agreed with the plan and demonstrated an understanding of the instructions.   The patient was advised to call back or seek an in-person evaluation if the symptoms worsen or if the condition fails to improve as anticipated.  I provided 22 minutes of non-face-to-face time during this encounter.   Fenton Foy, NP

## 2018-09-13 DIAGNOSIS — I255 Ischemic cardiomyopathy: Secondary | ICD-10-CM | POA: Diagnosis not present

## 2018-09-13 DIAGNOSIS — J9622 Acute and chronic respiratory failure with hypercapnia: Secondary | ICD-10-CM | POA: Diagnosis not present

## 2018-09-13 DIAGNOSIS — E1122 Type 2 diabetes mellitus with diabetic chronic kidney disease: Secondary | ICD-10-CM | POA: Diagnosis not present

## 2018-09-13 DIAGNOSIS — J9621 Acute and chronic respiratory failure with hypoxia: Secondary | ICD-10-CM | POA: Diagnosis not present

## 2018-09-13 DIAGNOSIS — I700 Atherosclerosis of aorta: Secondary | ICD-10-CM | POA: Diagnosis not present

## 2018-09-13 DIAGNOSIS — I25110 Atherosclerotic heart disease of native coronary artery with unstable angina pectoris: Secondary | ICD-10-CM | POA: Diagnosis not present

## 2018-09-13 DIAGNOSIS — I252 Old myocardial infarction: Secondary | ICD-10-CM | POA: Diagnosis not present

## 2018-09-13 DIAGNOSIS — I5042 Chronic combined systolic (congestive) and diastolic (congestive) heart failure: Secondary | ICD-10-CM | POA: Diagnosis not present

## 2018-09-13 DIAGNOSIS — I130 Hypertensive heart and chronic kidney disease with heart failure and stage 1 through stage 4 chronic kidney disease, or unspecified chronic kidney disease: Secondary | ICD-10-CM | POA: Diagnosis not present

## 2018-09-13 DIAGNOSIS — F17210 Nicotine dependence, cigarettes, uncomplicated: Secondary | ICD-10-CM | POA: Diagnosis not present

## 2018-09-13 DIAGNOSIS — J438 Other emphysema: Secondary | ICD-10-CM | POA: Diagnosis not present

## 2018-09-13 DIAGNOSIS — D631 Anemia in chronic kidney disease: Secondary | ICD-10-CM | POA: Diagnosis not present

## 2018-09-13 DIAGNOSIS — N183 Chronic kidney disease, stage 3 (moderate): Secondary | ICD-10-CM | POA: Diagnosis not present

## 2018-09-17 DIAGNOSIS — F17210 Nicotine dependence, cigarettes, uncomplicated: Secondary | ICD-10-CM | POA: Diagnosis not present

## 2018-09-17 DIAGNOSIS — I130 Hypertensive heart and chronic kidney disease with heart failure and stage 1 through stage 4 chronic kidney disease, or unspecified chronic kidney disease: Secondary | ICD-10-CM | POA: Diagnosis not present

## 2018-09-17 DIAGNOSIS — J438 Other emphysema: Secondary | ICD-10-CM | POA: Diagnosis not present

## 2018-09-17 DIAGNOSIS — I255 Ischemic cardiomyopathy: Secondary | ICD-10-CM | POA: Diagnosis not present

## 2018-09-17 DIAGNOSIS — D631 Anemia in chronic kidney disease: Secondary | ICD-10-CM | POA: Diagnosis not present

## 2018-09-17 DIAGNOSIS — S4992XA Unspecified injury of left shoulder and upper arm, initial encounter: Secondary | ICD-10-CM | POA: Diagnosis not present

## 2018-09-17 DIAGNOSIS — I700 Atherosclerosis of aorta: Secondary | ICD-10-CM | POA: Diagnosis not present

## 2018-09-17 DIAGNOSIS — J9622 Acute and chronic respiratory failure with hypercapnia: Secondary | ICD-10-CM | POA: Diagnosis not present

## 2018-09-17 DIAGNOSIS — I252 Old myocardial infarction: Secondary | ICD-10-CM | POA: Diagnosis not present

## 2018-09-17 DIAGNOSIS — E1122 Type 2 diabetes mellitus with diabetic chronic kidney disease: Secondary | ICD-10-CM | POA: Diagnosis not present

## 2018-09-17 DIAGNOSIS — N183 Chronic kidney disease, stage 3 (moderate): Secondary | ICD-10-CM | POA: Diagnosis not present

## 2018-09-17 DIAGNOSIS — I5042 Chronic combined systolic (congestive) and diastolic (congestive) heart failure: Secondary | ICD-10-CM | POA: Diagnosis not present

## 2018-09-17 DIAGNOSIS — J9621 Acute and chronic respiratory failure with hypoxia: Secondary | ICD-10-CM | POA: Diagnosis not present

## 2018-09-17 DIAGNOSIS — I25110 Atherosclerotic heart disease of native coronary artery with unstable angina pectoris: Secondary | ICD-10-CM | POA: Diagnosis not present

## 2018-09-17 NOTE — Progress Notes (Signed)
Chart and office note reviewed in detail  > agree with a/p as outlined    

## 2018-09-18 DIAGNOSIS — N3941 Urge incontinence: Secondary | ICD-10-CM | POA: Diagnosis not present

## 2018-09-19 ENCOUNTER — Telehealth: Payer: Self-pay | Admitting: Internal Medicine

## 2018-09-19 DIAGNOSIS — I252 Old myocardial infarction: Secondary | ICD-10-CM | POA: Diagnosis not present

## 2018-09-19 DIAGNOSIS — I25110 Atherosclerotic heart disease of native coronary artery with unstable angina pectoris: Secondary | ICD-10-CM | POA: Diagnosis not present

## 2018-09-19 DIAGNOSIS — I255 Ischemic cardiomyopathy: Secondary | ICD-10-CM | POA: Diagnosis not present

## 2018-09-19 DIAGNOSIS — J438 Other emphysema: Secondary | ICD-10-CM | POA: Diagnosis not present

## 2018-09-19 DIAGNOSIS — J9621 Acute and chronic respiratory failure with hypoxia: Secondary | ICD-10-CM | POA: Diagnosis not present

## 2018-09-19 DIAGNOSIS — I5042 Chronic combined systolic (congestive) and diastolic (congestive) heart failure: Secondary | ICD-10-CM | POA: Diagnosis not present

## 2018-09-19 DIAGNOSIS — N183 Chronic kidney disease, stage 3 (moderate): Secondary | ICD-10-CM | POA: Diagnosis not present

## 2018-09-19 DIAGNOSIS — J9622 Acute and chronic respiratory failure with hypercapnia: Secondary | ICD-10-CM | POA: Diagnosis not present

## 2018-09-19 DIAGNOSIS — F17210 Nicotine dependence, cigarettes, uncomplicated: Secondary | ICD-10-CM | POA: Diagnosis not present

## 2018-09-19 DIAGNOSIS — E1122 Type 2 diabetes mellitus with diabetic chronic kidney disease: Secondary | ICD-10-CM | POA: Diagnosis not present

## 2018-09-19 DIAGNOSIS — I130 Hypertensive heart and chronic kidney disease with heart failure and stage 1 through stage 4 chronic kidney disease, or unspecified chronic kidney disease: Secondary | ICD-10-CM | POA: Diagnosis not present

## 2018-09-19 DIAGNOSIS — D631 Anemia in chronic kidney disease: Secondary | ICD-10-CM | POA: Diagnosis not present

## 2018-09-19 DIAGNOSIS — I700 Atherosclerosis of aorta: Secondary | ICD-10-CM | POA: Diagnosis not present

## 2018-09-19 NOTE — Telephone Encounter (Signed)
Received a call from [facility] with [place]. When she went to see pt, pt stated to her that she has not been feeling well as yesterday, 6/23 was when she really began to feel bad and it became worse around 5pm yesterday.  Pt went to see PCP for an OV 6/23.   Per Levada Dy, pt has been wheezing, has a dry cough, increased SOB, abdominal pain, has vomited some when she goes to urinate. Pt has not had any leg edema.  Levada Dy stated that pt has bumped herself up to 2.5L O2, sats dropped right to 90% with exertion.  Pt's temp today was 99.1.  Pt and Levada Dy are wanting to know recommendations to help with pt's symptoms. Dr. Melvyn Novas, please advise on this for pt and Angela with [employer]. Thank you!

## 2018-09-19 NOTE — Telephone Encounter (Signed)
Called and spoke with Levada Dy stating to her the info from [facility]. Levada Dy verbalized understanding and stated she would call pt to let her know the information. Nothing further needed.

## 2018-09-19 NOTE — Telephone Encounter (Signed)
As long as can get comfortable on 02 at rest nothing further to add if dropping on 02 at rest or losing ground wit breathing will need to go directly to ER as nothing else to offer over the phone.

## 2018-09-21 NOTE — Telephone Encounter (Signed)
Encounter opened in error. Closing encounter.

## 2018-09-24 DIAGNOSIS — I252 Old myocardial infarction: Secondary | ICD-10-CM | POA: Diagnosis not present

## 2018-09-24 DIAGNOSIS — I5042 Chronic combined systolic (congestive) and diastolic (congestive) heart failure: Secondary | ICD-10-CM | POA: Diagnosis not present

## 2018-09-24 DIAGNOSIS — D631 Anemia in chronic kidney disease: Secondary | ICD-10-CM | POA: Diagnosis not present

## 2018-09-24 DIAGNOSIS — E1122 Type 2 diabetes mellitus with diabetic chronic kidney disease: Secondary | ICD-10-CM | POA: Diagnosis not present

## 2018-09-24 DIAGNOSIS — F17210 Nicotine dependence, cigarettes, uncomplicated: Secondary | ICD-10-CM | POA: Diagnosis not present

## 2018-09-24 DIAGNOSIS — I25110 Atherosclerotic heart disease of native coronary artery with unstable angina pectoris: Secondary | ICD-10-CM | POA: Diagnosis not present

## 2018-09-24 DIAGNOSIS — I130 Hypertensive heart and chronic kidney disease with heart failure and stage 1 through stage 4 chronic kidney disease, or unspecified chronic kidney disease: Secondary | ICD-10-CM | POA: Diagnosis not present

## 2018-09-24 DIAGNOSIS — J9622 Acute and chronic respiratory failure with hypercapnia: Secondary | ICD-10-CM | POA: Diagnosis not present

## 2018-09-24 DIAGNOSIS — J438 Other emphysema: Secondary | ICD-10-CM | POA: Diagnosis not present

## 2018-09-24 DIAGNOSIS — I255 Ischemic cardiomyopathy: Secondary | ICD-10-CM | POA: Diagnosis not present

## 2018-09-24 DIAGNOSIS — N183 Chronic kidney disease, stage 3 (moderate): Secondary | ICD-10-CM | POA: Diagnosis not present

## 2018-09-24 DIAGNOSIS — I700 Atherosclerosis of aorta: Secondary | ICD-10-CM | POA: Diagnosis not present

## 2018-09-24 DIAGNOSIS — J9621 Acute and chronic respiratory failure with hypoxia: Secondary | ICD-10-CM | POA: Diagnosis not present

## 2018-09-25 DIAGNOSIS — J449 Chronic obstructive pulmonary disease, unspecified: Secondary | ICD-10-CM | POA: Diagnosis not present

## 2018-09-26 DIAGNOSIS — N183 Chronic kidney disease, stage 3 (moderate): Secondary | ICD-10-CM | POA: Diagnosis not present

## 2018-09-26 DIAGNOSIS — F17210 Nicotine dependence, cigarettes, uncomplicated: Secondary | ICD-10-CM | POA: Diagnosis not present

## 2018-09-26 DIAGNOSIS — D631 Anemia in chronic kidney disease: Secondary | ICD-10-CM | POA: Diagnosis not present

## 2018-09-26 DIAGNOSIS — I25110 Atherosclerotic heart disease of native coronary artery with unstable angina pectoris: Secondary | ICD-10-CM | POA: Diagnosis not present

## 2018-09-26 DIAGNOSIS — J9622 Acute and chronic respiratory failure with hypercapnia: Secondary | ICD-10-CM | POA: Diagnosis not present

## 2018-09-26 DIAGNOSIS — J438 Other emphysema: Secondary | ICD-10-CM | POA: Diagnosis not present

## 2018-09-26 DIAGNOSIS — E1122 Type 2 diabetes mellitus with diabetic chronic kidney disease: Secondary | ICD-10-CM | POA: Diagnosis not present

## 2018-09-26 DIAGNOSIS — I5042 Chronic combined systolic (congestive) and diastolic (congestive) heart failure: Secondary | ICD-10-CM | POA: Diagnosis not present

## 2018-09-26 DIAGNOSIS — I130 Hypertensive heart and chronic kidney disease with heart failure and stage 1 through stage 4 chronic kidney disease, or unspecified chronic kidney disease: Secondary | ICD-10-CM | POA: Diagnosis not present

## 2018-09-26 DIAGNOSIS — I255 Ischemic cardiomyopathy: Secondary | ICD-10-CM | POA: Diagnosis not present

## 2018-09-26 DIAGNOSIS — I700 Atherosclerosis of aorta: Secondary | ICD-10-CM | POA: Diagnosis not present

## 2018-09-26 DIAGNOSIS — K432 Incisional hernia without obstruction or gangrene: Secondary | ICD-10-CM | POA: Diagnosis not present

## 2018-09-26 DIAGNOSIS — I252 Old myocardial infarction: Secondary | ICD-10-CM | POA: Diagnosis not present

## 2018-09-26 DIAGNOSIS — J9621 Acute and chronic respiratory failure with hypoxia: Secondary | ICD-10-CM | POA: Diagnosis not present

## 2018-09-27 ENCOUNTER — Telehealth: Payer: Federal, State, Local not specified - PPO | Admitting: Cardiology

## 2018-10-02 DIAGNOSIS — E1122 Type 2 diabetes mellitus with diabetic chronic kidney disease: Secondary | ICD-10-CM | POA: Diagnosis not present

## 2018-10-02 DIAGNOSIS — I5042 Chronic combined systolic (congestive) and diastolic (congestive) heart failure: Secondary | ICD-10-CM | POA: Diagnosis not present

## 2018-10-02 DIAGNOSIS — N183 Chronic kidney disease, stage 3 (moderate): Secondary | ICD-10-CM | POA: Diagnosis not present

## 2018-10-02 DIAGNOSIS — I255 Ischemic cardiomyopathy: Secondary | ICD-10-CM | POA: Diagnosis not present

## 2018-10-02 DIAGNOSIS — J9622 Acute and chronic respiratory failure with hypercapnia: Secondary | ICD-10-CM | POA: Diagnosis not present

## 2018-10-02 DIAGNOSIS — J438 Other emphysema: Secondary | ICD-10-CM | POA: Diagnosis not present

## 2018-10-02 DIAGNOSIS — D631 Anemia in chronic kidney disease: Secondary | ICD-10-CM | POA: Diagnosis not present

## 2018-10-02 DIAGNOSIS — I700 Atherosclerosis of aorta: Secondary | ICD-10-CM | POA: Diagnosis not present

## 2018-10-02 DIAGNOSIS — I130 Hypertensive heart and chronic kidney disease with heart failure and stage 1 through stage 4 chronic kidney disease, or unspecified chronic kidney disease: Secondary | ICD-10-CM | POA: Diagnosis not present

## 2018-10-02 DIAGNOSIS — I25110 Atherosclerotic heart disease of native coronary artery with unstable angina pectoris: Secondary | ICD-10-CM | POA: Diagnosis not present

## 2018-10-02 DIAGNOSIS — F17210 Nicotine dependence, cigarettes, uncomplicated: Secondary | ICD-10-CM | POA: Diagnosis not present

## 2018-10-02 DIAGNOSIS — I252 Old myocardial infarction: Secondary | ICD-10-CM | POA: Diagnosis not present

## 2018-10-02 DIAGNOSIS — J9621 Acute and chronic respiratory failure with hypoxia: Secondary | ICD-10-CM | POA: Diagnosis not present

## 2018-10-03 ENCOUNTER — Ambulatory Visit (INDEPENDENT_AMBULATORY_CARE_PROVIDER_SITE_OTHER): Payer: Federal, State, Local not specified - PPO | Admitting: Cardiology

## 2018-10-03 ENCOUNTER — Encounter: Payer: Self-pay | Admitting: Cardiology

## 2018-10-03 ENCOUNTER — Other Ambulatory Visit: Payer: Self-pay

## 2018-10-03 VITALS — BP 110/70 | HR 87 | Ht 62.0 in | Wt 228.8 lb

## 2018-10-03 DIAGNOSIS — J449 Chronic obstructive pulmonary disease, unspecified: Secondary | ICD-10-CM

## 2018-10-03 DIAGNOSIS — I25118 Atherosclerotic heart disease of native coronary artery with other forms of angina pectoris: Secondary | ICD-10-CM

## 2018-10-03 DIAGNOSIS — I255 Ischemic cardiomyopathy: Secondary | ICD-10-CM

## 2018-10-03 DIAGNOSIS — I5042 Chronic combined systolic (congestive) and diastolic (congestive) heart failure: Secondary | ICD-10-CM | POA: Diagnosis not present

## 2018-10-03 NOTE — Patient Instructions (Signed)
Medication Instructions:  Your physician recommends that you continue on your current medications as directed. Please refer to the Current Medication list given to you today.  If you need a refill on your cardiac medications before your next appointment, please call your pharmacy.   Lab work: Your physician recommends that you return for lab work in:  [place] BNP,BMP  If you have labs (blood work) drawn today and your tests are completely normal, you will receive your results only by: Marland Kitchen MyChart Message (if you have MyChart) OR . A paper copy in the mail If you have any lab test that is abnormal or we need to change your treatment, we will call you to review the results.  Testing/Procedures: None  Follow-Up: At [facility], you and your health needs are our priority.  As part of our continuing mission to provide you with exceptional heart care, we have created designated Provider Care Teams.  These Care Teams include your primary Cardiologist (physician) and Advanced Practice Providers (APPs -  Physician Assistants and Nurse Practitioners) who all work together to provide you with the care you need, when you need it. You will need a follow up appointment in 3 weeks. Any Other Special Instructions Will Be Listed Below (If Applicable).

## 2018-10-03 NOTE — Progress Notes (Signed)
Cardiology Office Note:    Date:  10/03/2018   ID:  Crystal Yates, Crystal Yates 03/03/1956, MRN [id number]  PCP:  Dortha Kern, PA  Cardiologist:  Jenne Campus, MD  Electrophysiologist:  None   Referring MD: Dortha Kern, [place]   Chief Complaint: [phone] female presents for 1 month follow up of ICM and CHF.   History of Present Illness:    Crystal Yates is a 63 y.o. female with a hx of ischemic cardiomyopathy 40-45% EF, HTN, chronic systolic and diastolic congestive heart failure, advanced COPD on 2L home oxygen, CAD s/p stenting. Additional medical history include adrenal insufficiency on chronic Cortef, DM2 on long term insulin, CKD3, hypothyroidism, tobacco use. Presents today for 1 month follow up.  Overall overall she complained of feeling poorly.  She is tired exhausted short of breath.  She also gained some weight.  And the [place] question if this is more her lungs are more heart.  I think there is some room to improve with diuresis.  Will check Chem-7 as well as proBNP today and then decide about augmenting her diuretics.  I think we reached the point it may be Zaroxolyn will be warranted.  She was admitted to [facility] 09/05/18-09/07/18 for shortness of breath. Diagnosed with sepsis due to PNA.   Past Medical History:  Diagnosis Date  . Acute on chronic systolic heart failure exacerbation(HCC) 04/08/2016  . Arthritis   . CAD in native artery    a. Prior LAD stenting based on cath. b. RCA stenting 03/2016 x2.  . Chronic combined systolic and diastolic CHF (congestive heart failure) ([place])   . CKD (chronic kidney disease), stage II   . COPD (chronic obstructive pulmonary disease) ([place])   . Diabetes mellitus without complication ([place])   . Hashimoto's thyroiditis   . Hyperlipidemia   . Hypertension   . On home oxygen therapy    "2L; 24/7" (10/23/2017)  . Secondary adrenal insufficiency ([place])   . Thyroid disease   . Tobacco abuse     Past Surgical History:   Procedure Laterality Date  . ABDOMINAL SURGERY    . CESAREAN SECTION    . CHOLECYSTECTOMY    . COLON RESECTION    . COLONOSCOPY WITH PROPOFOL N/A 01/16/2018   Procedure: COLONOSCOPY WITH PROPOFOL;  Surgeon: Rush Landmark Telford Nab., MD;  Location: [facility];  Service: Gastroenterology;  Laterality: N/A;  . CORONARY BALLOON ANGIOPLASTY N/A 10/13/2017   Procedure: CORONARY BALLOON ANGIOPLASTY;  Surgeon: Belva Crome, MD;  Location: [facility];  Service: Cardiovascular;  Laterality: N/A;  . HERNIA MESH REMOVAL    . HERNIA REPAIR    . LEFT HEART CATH AND CORONARY ANGIOGRAPHY N/A 10/13/2016   Procedure: Left Heart Cath and Coronary Angiography;  Surgeon: Nelva Bush, MD;  Location: [facility];  Service: Cardiovascular;  Laterality: N/A;  . POLYPECTOMY  01/16/2018   Procedure: POLYPECTOMY;  Surgeon: Rush Landmark Telford Nab., MD;  Location: [facility];  Service: Gastroenterology;;  . RIGHT/LEFT HEART CATH AND CORONARY ANGIOGRAPHY N/A 10/13/2017   Procedure: RIGHT/LEFT HEART CATH AND CORONARY ANGIOGRAPHY;  Surgeon: Belva Crome, MD;  Location: [facility];  Service: Cardiovascular;  Laterality: N/A;  . SHOULDER ARTHROSCOPY    . TUBAL LIGATION      Current Medications: Current Meds  Medication Sig  . acetaminophen (TYLENOL) 500 MG tablet Take 1 tablet (500 mg total) by mouth every 6 (six) hours as needed for mild pain.  Marland Kitchen albuterol (PROAIR HFA) 108 (90  Base) MCG/ACT inhaler Inhale 2 puffs into the lungs every 4-6 hours as needed for shortness of breath or wheezing  . albuterol (PROVENTIL) (2.5 MG/3ML) 0.083% nebulizer solution Take 3 mLs (2.5 mg total) by nebulization every 6 (six) hours as needed for wheezing or shortness of breath.  Marland Kitchen aspirin EC 81 MG EC tablet Take 1 tablet (81 mg total) by mouth daily.  Marland Kitchen atorvastatin (LIPITOR) 80 MG tablet Take 80 mg by mouth every evening.   Marland Kitchen CALCIUM POLYCARBOPHIL PO Take 3 tablets by mouth daily.  . Cholecalciferol  (VITAMIN D3) 2000 units capsule Take 2,000 Units by mouth daily.   . clopidogrel (PLAVIX) 75 MG tablet Take 1 tablet (75 mg total) by mouth daily. (Patient taking differently: Take 75 mg by mouth at bedtime. )  . DULoxetine (CYMBALTA) 60 MG capsule Take 120 mg by mouth daily.   . furosemide (LASIX) 20 MG tablet Take 80 mg by mouth daily.  . hydrocortisone (CORTEF) 5 MG tablet Take 5-15 mg by mouth See admin instructions. Take 15 mg in the morning and 5 mg in the evening  . insulin degludec (TRESIBA FLEXTOUCH) 100 UNIT/ML SOPN FlexTouch Pen Inject 42 Units into the skin at bedtime.   . Iron-FA-B Cmp-C-Biot-Probiotic (FUSION PLUS) CAPS Take 1 capsule by mouth at bedtime.   Marland Kitchen levothyroxine (SYNTHROID, LEVOTHROID) 150 MCG tablet Take 150 mcg by mouth daily before breakfast.  . MAGNESIUM-OXIDE 400 (241.3 Mg) MG tablet Take 400 mg by mouth daily.  . nitroGLYCERIN (NITROSTAT) 0.4 MG SL tablet Place 1 tablet (0.4 mg total) under the tongue every 5 (five) minutes as needed for chest pain.  . OXYGEN Place 2 L into the nose continuous.   . polyethylene glycol (MIRALAX / GLYCOLAX) packet Take 17 g by mouth daily. (Patient taking differently: Take 17 g by mouth daily as needed for mild constipation. )  . potassium chloride SA (K-DUR,KLOR-CON) 20 MEQ tablet Take 1 tablet (20 mEq total) by mouth daily. Take when you take Torsemide.  . pramipexole (MIRAPEX) 1 MG tablet Take 1 mg by mouth 2 (two) times a day.  . Tiotropium Bromide-Olodaterol (STIOLTO RESPIMAT) 2.5-2.5 MCG/ACT AERS Inhale 2 puffs into the lungs daily.  . TRULICITY 1.5 CH/8.5ID SOPN Inject 1.5 mg into the skin every Sunday.   . zaleplon (SONATA) 10 MG capsule Take 10 mg by mouth at bedtime as needed for sleep.     Allergies:   Hydroxychloroquine, Donepezil, Prednisone, Anticoagulant cit dext [acd formula a], Bupropion, Metrizamide, Varenicline, and Tape   Social History   Socioeconomic History  . Marital status: Married    Spouse name: Not on  file  . Number of children: 4  . Years of education: Not on file  . Highest education level: Not on file  Occupational History  . Occupation: disabled  Social Needs  . Financial resource strain: Not on file  . Food insecurity    Worry: Not on file    Inability: Not on file  . Transportation needs    Medical: Not on file    Non-medical: Not on file  Tobacco Use  . Smoking status: Current Every Day Smoker    Packs/day: 1.00    Years: 44.00    Pack years: 44.00    Types: Cigarettes  . Smokeless tobacco: Never Used  . Tobacco comment: needs a patch  Substance and Sexual Activity  . Alcohol use: Yes    Alcohol/week: 7.0 standard drinks    Types: 7 Shots of liquor per week  Comment: 1 drink a day at the most  . Drug use: Not Currently    Types: Marijuana    Comment: 09/05/18 "about a month ago, my granddaughter set me up on something"  . Sexual activity: Yes  Lifestyle  . Physical activity    Days per week: Not on file    Minutes per session: Not on file  . Stress: Not on file  Relationships  . Social Herbalist on phone: Patient refused    Gets together: Patient refused    Attends religious service: Patient refused    Active member of club or organization: Patient refused    Attends meetings of clubs or organizations: Patient refused    Relationship status: Patient refused  Other Topics Concern  . Not on file  Social History Narrative  . Not on file     Family History: The patient's family history includes Diabetes in her father, sister, sister, and son; Stroke in her mother.  ROS:   Please see the history of present illness.    ROS All other systems reviewed and are negative.  EKGs/Labs/Other Studies Reviewed:    The following studies were reviewed today: Echocardiogram 07/20/18  1. Severe hypokinesis of the left ventricular, basal-mid inferior wall and inferolateral wall.  2. The left ventricle has mild-moderately reduced systolic function, with  an ejection fraction of 40-45%. Left ventricular diastolic Doppler parameters are consistent with impaired relaxation. Left ventricular diffuse hypokinesis.  3. Left atrial size was not assessed.  4. The aortic valve is tricuspid.  5. The aortic arch is normal in size and structure.  6. Shunt cannot be excluded on the basis of these images.  Cardiac Cath 09/2017 Cardiac catheterization 09/2017:  Normal left main.  Proximal to mid LAD stent with diffuse 30% restenosis.  Proximal circumflex with an eccentric 50 to 65% stenosis depending upon review..  Progression at this site is noted when compared to one year ago when this region contained 30% narrowing.  FFR was not possible due to COPD and intolerance of adenosine.  Dominant right coronary with proximal to distal stenting and overlap fashion, possibly with some region of stent within stent.  Distally there is an eccentric 70 to 75% stenosis that is clearly progressed when compared to one year ago when there was less than 50% narrowing.  Mild pulmonary hypertension with mean PA pressure of 30 mmHg.  Reduced LV systolic function with regional wall motion abnormality including severe hypokinesis of the inferobasal wall.  Estimated EF 40 to 45%.  LVEDP is 20 mmHg.  Mean pulmonary capillary wedge pressure is 20 mmHg.  Successful balloon angioplasty of distal right coronary ISR reducing from 75% to 0% with TIMI grade III flow using a 3.5 mm [place] balloon x2 inflations.     Recent Labs: 03/15/2018: NT-Pro BNP 858 08/06/2018: TSH 1.316 09/05/2018: ALT 18; B Natriuretic Peptide 138.9 09/07/2018: BUN 36; Creatinine, Ser 1.09; Hemoglobin 13.2; Magnesium 1.8; Platelets 201; Potassium 3.6; Sodium 142   Recent Lipid Panel    Component Value Date/Time   CHOL 122 10/13/2016 0037   TRIG 68 10/13/2016 0037   HDL 34 (L) 10/13/2016 0037   CHOLHDL 3.6 10/13/2016 0037   VLDL 14 10/13/2016 0037   LDLCALC 74 10/13/2016 0037    Physical Exam:    VS:   BP 110/70   Pulse 87   Ht 5\' 2"  (1.575 m)   Wt 228 lb 12.8 oz (103.8 kg)   SpO2 98% Comment: 2.5 lt  of O2  BMI 41.85 kg/m     Wt Readings from Last 3 Encounters:  10/03/18 228 lb 12.8 oz (103.8 kg)  09/05/18 220 lb (99.8 kg)  08/28/18 223 lb (101.2 kg)     GEN:  Well nourished, well developed in no acute distress HEENT: Normal NECK: No JVD; No carotid bruits LYMPHATICS: No lymphadenopathy CARDIAC: RRR, no murmurs, rubs, gallops RESPIRATORY: Poor air entry clear to auscultation without rales, wheezing or rhonchi  ABDOMEN: Soft, non-tender, non-distended MUSCULOSKELETAL: Bilateral 1+ edema; No deformity  SKIN: Warm and dry NEUROLOGIC:  Alert and oriented x 3 PSYCHIATRIC:  Normal affect   ASSESSMENT:    1. Chronic combined systolic (congestive) and diastolic (congestive) heart failure ([place])   2. Ischemic cardiomyopathy   3. Coronary artery disease of native artery of native heart with stable angina pectoris ([place])   4. COPD without exacerbation ([place])    PLAN:    In order of problems listed above:  1. Chronic combined systolic and diastolic heart failure -will ask you to have proBNP as well as Chem-7.  Based on that we will decide if we can augment her diuresis. 2. ICM -last cardiac catheterization 1 year ago showed some questionable lesion in the circumflex artery.  Will try to manage this situation medically first. 3. CAD - Last cardiac cath 09/2017  4. COPD -advanced and problematic.  She still continue to smoke.  Overall is a difficult situation that the combination of COPD as well as CHF and CAD.  We will try to manage every single problem the best we can.   Medication Adjustments/Labs and Tests Ordered: Current medicines are reviewed at length with the patient today.  Concerns regarding medicines are outlined above.  No orders of the defined types were placed in this encounter.  No orders of the defined types were placed in this encounter.   There are no Patient  Instructions on file for this visit.   Signed, Jenne Campus, MD  10/03/2018 11:01 AM    [place]

## 2018-10-04 ENCOUNTER — Telehealth: Payer: Self-pay | Admitting: *Deleted

## 2018-10-04 DIAGNOSIS — I5042 Chronic combined systolic (congestive) and diastolic (congestive) heart failure: Secondary | ICD-10-CM

## 2018-10-04 LAB — BASIC METABOLIC PANEL
BUN/Creatinine Ratio: 15 (ref 12–28)
BUN: 17 mg/dL (ref 8–27)
CO2: 31 mmol/L — ABNORMAL HIGH (ref 20–29)
Calcium: 9.6 mg/dL (ref 8.7–10.3)
Chloride: 98 mmol/L (ref 96–106)
Creatinine, Ser: 1.12 mg/dL — ABNORMAL HIGH (ref 0.57–1.00)
GFR calc Af Amer: 60 mL/min/{1.73_m2} (ref >59)
GFR calc non Af Amer: 52 mL/min/{1.73_m2} — ABNORMAL LOW (ref >59)
Glucose: 121 mg/dL — ABNORMAL HIGH (ref 65–99)
Potassium: 3.8 mmol/L (ref 3.5–5.2)
Sodium: 145 mmol/L — ABNORMAL HIGH (ref 134–144)

## 2018-10-04 LAB — PRO B NATRIURETIC PEPTIDE: NT-Pro BNP: 1049 pg/mL — ABNORMAL HIGH (ref 0–287)

## 2018-10-04 MED ORDER — FUROSEMIDE 40 MG PO TABS: 80.0000 mg | ORAL_TABLET | Freq: Two times a day (BID) | ORAL | 2 refills | Status: DC

## 2018-10-04 MED ORDER — POTASSIUM CHLORIDE CRYS ER 20 MEQ PO TBCR: 40.0000 meq | EXTENDED_RELEASE_TABLET | Freq: Every day | ORAL | 0 refills | Status: DC

## 2018-10-04 NOTE — Telephone Encounter (Signed)
-----   Message from Park Liter, MD sent at 10/04/2018 12:56 PM EDT ----- Double lasix and double potassium  chem7 in 1 week

## 2018-10-04 NOTE — Telephone Encounter (Signed)
Telephone call to patient . Informed of lab results and need to increase lasix to 80 mg twice daily,poatssium 40 meq daily and need for repeat BMP in 1 week

## 2018-10-05 ENCOUNTER — Telehealth: Payer: Self-pay | Admitting: Neurology

## 2018-10-05 NOTE — Telephone Encounter (Signed)
Error

## 2018-10-05 NOTE — Telephone Encounter (Signed)
Patient left msg was wanting to know If when last year she got short of breath and was vented based on symptoms, she wants to know if that caused her any brain or nerve damage in your opinion. Thanks!

## 2018-10-05 NOTE — Telephone Encounter (Signed)
Probably not but that is not the reason that I saw her.

## 2018-10-05 NOTE — Telephone Encounter (Signed)
Advised patient that being vented was the cause of her symptoms.

## 2018-10-07 ENCOUNTER — Other Ambulatory Visit: Payer: Self-pay

## 2018-10-07 ENCOUNTER — Emergency Department (HOSPITAL_COMMUNITY): Payer: Federal, State, Local not specified - PPO

## 2018-10-07 ENCOUNTER — Encounter (HOSPITAL_COMMUNITY): Payer: Self-pay | Admitting: Emergency Medicine

## 2018-10-07 ENCOUNTER — Emergency Department (HOSPITAL_COMMUNITY)
Admission: EM | Admit: 2018-10-07 | Discharge: 2018-10-07 | Disposition: A | Discharge status: Home / Self Care | Payer: Federal, State, Local not specified - PPO | Attending: Emergency Medicine | Admitting: Emergency Medicine

## 2018-10-07 DIAGNOSIS — R0602 Shortness of breath: Secondary | ICD-10-CM | POA: Diagnosis not present

## 2018-10-07 DIAGNOSIS — R0600 Dyspnea, unspecified: Secondary | ICD-10-CM | POA: Diagnosis not present

## 2018-10-07 DIAGNOSIS — I5042 Chronic combined systolic (congestive) and diastolic (congestive) heart failure: Secondary | ICD-10-CM | POA: Insufficient documentation

## 2018-10-07 DIAGNOSIS — N183 Chronic kidney disease, stage 3 (moderate): Secondary | ICD-10-CM | POA: Insufficient documentation

## 2018-10-07 DIAGNOSIS — I110 Hypertensive heart disease with heart failure: Secondary | ICD-10-CM | POA: Diagnosis not present

## 2018-10-07 DIAGNOSIS — I130 Hypertensive heart and chronic kidney disease with heart failure and stage 1 through stage 4 chronic kidney disease, or unspecified chronic kidney disease: Secondary | ICD-10-CM | POA: Diagnosis not present

## 2018-10-07 DIAGNOSIS — Z7982 Long term (current) use of aspirin: Secondary | ICD-10-CM | POA: Insufficient documentation

## 2018-10-07 DIAGNOSIS — Z794 Long term (current) use of insulin: Secondary | ICD-10-CM | POA: Insufficient documentation

## 2018-10-07 DIAGNOSIS — I509 Heart failure, unspecified: Secondary | ICD-10-CM | POA: Diagnosis not present

## 2018-10-07 DIAGNOSIS — Z79899 Other long term (current) drug therapy: Secondary | ICD-10-CM | POA: Diagnosis not present

## 2018-10-07 DIAGNOSIS — F17210 Nicotine dependence, cigarettes, uncomplicated: Secondary | ICD-10-CM | POA: Insufficient documentation

## 2018-10-07 DIAGNOSIS — J449 Chronic obstructive pulmonary disease, unspecified: Secondary | ICD-10-CM | POA: Insufficient documentation

## 2018-10-07 LAB — CBC WITH DIFFERENTIAL/PLATELET
Abs Immature Granulocytes: 0.07 10*3/uL (ref 0.00–0.07)
Basophils Absolute: 0 10*3/uL (ref 0.0–0.1)
Basophils Relative: 0 %
Eosinophils Absolute: 0.2 10*3/uL (ref 0.0–0.5)
Eosinophils Relative: 2 %
HCT: 43.9 % (ref 36.0–46.0)
Hemoglobin: 13.8 g/dL (ref 12.0–15.0)
Immature Granulocytes: 1 %
Lymphocytes Relative: 22 %
Lymphs Abs: 2.1 10*3/uL (ref 0.7–4.0)
MCH: 29.4 pg (ref 26.0–34.0)
MCHC: 31.4 g/dL (ref 30.0–36.0)
MCV: 93.4 fL (ref 80.0–100.0)
Monocytes Absolute: 0.7 10*3/uL (ref 0.1–1.0)
Monocytes Relative: 7 %
Neutro Abs: 6.7 10*3/uL (ref 1.7–7.7)
Neutrophils Relative %: 68 %
Platelets: 207 10*3/uL (ref 150–400)
RBC: 4.7 MIL/uL (ref 3.87–5.11)
RDW: 14.6 % (ref 11.5–15.5)
WBC: 9.8 10*3/uL (ref 4.0–10.5)
nRBC: 0 % (ref 0.0–0.2)

## 2018-10-07 LAB — TROPONIN I (HIGH SENSITIVITY)
Troponin I (High Sensitivity): 38 ng/L — ABNORMAL HIGH (ref <18)
Troponin I (High Sensitivity): 42 ng/L — ABNORMAL HIGH (ref <18)

## 2018-10-07 LAB — BASIC METABOLIC PANEL
Anion gap: 11 (ref 5–15)
BUN: 13 mg/dL (ref 8–23)
CO2: 33 mmol/L — ABNORMAL HIGH (ref 22–32)
Calcium: 9.5 mg/dL (ref 8.9–10.3)
Chloride: 98 mmol/L (ref 98–111)
Creatinine, Ser: 1.14 mg/dL — ABNORMAL HIGH (ref 0.44–1.00)
GFR calc Af Amer: 59 mL/min — ABNORMAL LOW (ref >60)
GFR calc non Af Amer: 51 mL/min — ABNORMAL LOW (ref >60)
Glucose, Bld: 97 mg/dL (ref 70–99)
Potassium: 3.3 mmol/L — ABNORMAL LOW (ref 3.5–5.1)
Sodium: 142 mmol/L (ref 135–145)

## 2018-10-07 LAB — BRAIN NATRIURETIC PEPTIDE: B Natriuretic Peptide: 192.5 pg/mL — ABNORMAL HIGH (ref 0.0–100.0)

## 2018-10-07 MED ORDER — FUROSEMIDE 10 MG/ML IJ SOLN
80.0000 mg | Freq: Once | INTRAMUSCULAR | Status: AC
  Administered 2018-10-07: 80 mg via INTRAVENOUS
  Filled 2018-10-07: qty 8

## 2018-10-07 NOTE — ED Provider Notes (Signed)
Assumed care in sign out. 63yF with dyspnea. Recently advised to increase lasix to BID dosing but has still remained on the same. Initially on 3L/min supplemental o2 but decreased to her baseline of 2L. o2 sats remain normal on this. She seems relatively comfortable in terms of her WOB. Troponin is elevated but denies CP and this may be related to underlying HF. I do not think this is ACS.   Given 80mg  of lasix in ED. Instructed to increase home dosing to 80 mg BID and also increase potassium as cardiology recommended for the next week. FU back up with them.    Virgel Manifold, MD 10/07/18 [phone]

## 2018-10-07 NOTE — ED Triage Notes (Addendum)
Pt presents to ED POV from home. C/o SOB that has been going on for a "few weeks" worsening today , + tightness in chest. Chest pain 5/10, worse on exertion. Hx SOB, O2 dependent. Went up to 3L on [place] from normal 2L today. Pt says she's been trying to get fluid off for a few days.

## 2018-10-07 NOTE — Discharge Instructions (Addendum)
You need to take 80mg  of lasix (furosemide) twice a day and 40 mEq of potassium daily for the next 7 days and then return to previous doing. It will take a little time to get this extra fluid off. Follow-up with cardiology. If you feel like your symptoms are worsening in the interim then return to the ER.

## 2018-10-08 ENCOUNTER — Other Ambulatory Visit: Payer: Self-pay

## 2018-10-08 ENCOUNTER — Ambulatory Visit (INDEPENDENT_AMBULATORY_CARE_PROVIDER_SITE_OTHER): Payer: Federal, State, Local not specified - PPO | Admitting: Internal Medicine

## 2018-10-08 ENCOUNTER — Encounter: Payer: Self-pay | Admitting: Internal Medicine

## 2018-10-08 DIAGNOSIS — J9611 Chronic respiratory failure with hypoxia: Secondary | ICD-10-CM

## 2018-10-08 DIAGNOSIS — I4729 Other ventricular tachycardia: Secondary | ICD-10-CM

## 2018-10-08 DIAGNOSIS — I729 Aneurysm of unspecified site: Secondary | ICD-10-CM

## 2018-10-08 DIAGNOSIS — J449 Chronic obstructive pulmonary disease, unspecified: Secondary | ICD-10-CM

## 2018-10-08 DIAGNOSIS — D689 Coagulation defect, unspecified: Secondary | ICD-10-CM

## 2018-10-08 DIAGNOSIS — I472 Ventricular tachycardia: Secondary | ICD-10-CM

## 2018-10-08 DIAGNOSIS — J9612 Chronic respiratory failure with hypercapnia: Secondary | ICD-10-CM

## 2018-10-08 DIAGNOSIS — T81718A Complication of other artery following a procedure, not elsewhere classified, initial encounter: Secondary | ICD-10-CM

## 2018-10-08 DIAGNOSIS — F17210 Nicotine dependence, cigarettes, uncomplicated: Secondary | ICD-10-CM

## 2018-10-08 NOTE — Progress Notes (Signed)
Crystal Yates, female    DOB: [birth year],     MRN: [id number]    Brief patient profile:  17yowf MM/active smoker born in [place] and lived in [place] then [place] with heart dz freq stents breathing was good until around 2006  while in [place]  Started seeing Chodri for sob dx as copd/emphysema  then worse 2011 requiring some 02 since 2015  With likely GOLD III copd (with restrictive component) suggested on initial spirometry 01/31/2018   Self referred to pulmonary clinic 01/31/2018 p admit  Date of Admission: 01/04/2018 12:48 AM Date of Discharge: 01/17/2018 Attending Physician: No att. providers fou   Discharge Diagnosis: Principal Problem:   Acute on chronic respiratory failure with hypoxia and hypercapnia ([place]) Active Problems:   Secondary adrenal insufficiency ([place])   Type 2 diabetes mellitus without complication, with long-term current use of insulin ([facility])   Acute on chronic combined systolic and diastolic heart failure ([place])   COPD without exacerbation ([place])   CAD (coronary artery disease)   Iron deficiency   Internal and external bleeding hemorrhoids   Sessile colonic polyp   Diverticulosis of colon without hemorrhage   Echo 10/19/17 Study Conclusions  - Left ventricle: The cavity size was normal. There was mild focal   basal hypertrophy of the septum. Systolic function was normal.   The estimated ejection fraction was in the range of 60% to 65%.   Wall motion was normal; there were no regional wall motion   abnormalities. Doppler parameters are consistent with abnormal   left ventricular relaxation (grade 1 diastolic dysfunction).   Doppler parameters are consistent with indeterminate ventricular   filling pressure. - Aortic valve: Transvalvular velocity was within the normal range.   There was no stenosis. There was no regurgitation. - Mitral valve: Transvalvular velocity was within the normal range.   There was no evidence for stenosis. There was trivial  regurgitation. - Right ventricle: The cavity size was normal. Wall thickness was   normal. Systolic function was normal. - Tricuspid valve: There was trivial regurgitation.    01/31/2018  Pulmonary/ 1st office eval/[name]  Chief Complaint  Patient presents with  . Pulmonary Consult    Self referral. Pt states that she has COPD. She is on o2. She c/o SOB for the past 4 yrs, worse over the past 2 wks.   Dyspnea:  Can't do any walking at [employer] x 4 months - just room to room at home Cough: doe assoc with more cough than usual worse in am x 1 -2 tsp variably green x one year  Sleep: in recliner 30 degrees  SABA use: saba/ prednisone not much  rec Plan A = Automatic =Stop trelegy and start stiolto 2 pffs each am  Work on inhaler technique:  relax and gently blow all the way out then take a nice smooth deep breath back in, triggering the inhaler at same time you start breathing in.  Hold for up to 5 seconds if you can.   Rinse and gargle with water when done Plan B = Backup Only use your albuterol as a rescue medication  Plan C = Crisis - only use your albuterol nebulizer if you first try Plan B and it fails to help > ok to use the nebulizer up to every 4 hours but if start needing it regularly call for immediate appointment The key is to stop smoking completely before smoking completely stops you!   Please remember to go to the  x-ray department downstairs  in the basement  for your tests - we will call you with the results when they are available.        08/23/2018  f/u ov/[name] re: copd III but on 2-3 lpm 24/7/ still smoking / maint on stiolto Chief Complaint  Patient presents with  . Follow-up    Breathing has been worse "for the past five years"- back and forth from the hospital ever since the last visit. She is using her albuterol inhaler 3-4 x per day on average.    Dyspnea:  MMRC3 = can't walk 100 yards even at a slow pace at a flat grade s stopping due to sob on 3lpm   Cough: not  bad now Sleeping: 30 degrees sleep number bed ok SABA use: about  One or twice neb daily  02: 2-3- lpm rec Plan A = Automatic = stioloto 2 pffs each am  Work on inhaler technique: Plan B = Backup Only use your albuterol inhaler as a rescue medication Plan C = Crisis - only use your albuterol nebulizer if you first try Plan B and it fails to help  Plan D = Medrol  8mg  x 4 for one day, 3 x one day, 2 x one day ,and 1 or one day and off     10/08/2018  f/u ov/[name] re:   COPD III / 02 3lpm 24/7 and stiolto and still smokng / problems with  abd compliance  Chief Complaint  Patient presents with  . Follow-up    Increased SOB x 2 wks. She states she feels SOB all of the time with or without any exertion. She is using her albuterol inhaler 2 x daily on average and neb about once per day.  Dyspnea:  MMRC3 = can't walk 100 yards even at a slow pace at a flat grade s stopping due to sob   Cough: min am mucus then resolves x 10-40min  Sleeping: on back one pillow adjustable bed at 30 degrees  SABA use: as above  02: 3lpm 23/7    No obvious day to day or daytime variability or assoc excess/ purulent sputum or mucus plugs or hemoptysis or cp or chest tightness, subjective wheeze or overt sinus or hb symptoms.   Sleeping as above without nocturnal  exacerbation  of respiratory  c/o's or need for noct saba. Also denies any obvious fluctuation of symptoms with weather or environmental changes or other aggravating or alleviating factors except as outlined above   No unusual exposure hx or h/o childhood pna/ asthma or knowledge of premature birth.  Current Allergies, Complete Past Medical History, Past Surgical History, Family History, and Social History were reviewed in [facility] record.  ROS  The following are not active complaints unless bolded Hoarseness, sore throat, dysphagia, dental problems, itching, sneezing,  nasal congestion or discharge of excess mucus or  purulent secretions, ear ache,   fever, chills, sweats, unintended wt loss or wt gain, classically pleuritic or exertional cp,  orthopnea pnd or arm/hand swelling  or leg swelling, presyncope, palpitations, abdominal pain/binder, anorexia, nausea, vomiting, diarrhea  or change in bowel habits or change in bladder habits, change in stools or change in urine, dysuria, hematuria,  rash, arthralgias, visual complaints, headache, numbness, weakness or ataxia or problems with walking or coordination,  change in mood or  memory.        Current Meds  Medication Sig  . acetaminophen (TYLENOL) 500 MG tablet Take 1 tablet (500 mg total) by mouth  every 6 (six) hours as needed for mild pain.  Marland Kitchen albuterol (PROAIR HFA) 108 (90 Base) MCG/ACT inhaler Inhale 2 puffs into the lungs every 4-6 hours as needed for shortness of breath or wheezing  . albuterol (PROVENTIL) (2.5 MG/3ML) 0.083% nebulizer solution Take 3 mLs (2.5 mg total) by nebulization every 6 (six) hours as needed for wheezing or shortness of breath.  Marland Kitchen aspirin EC 81 MG EC tablet Take 1 tablet (81 mg total) by mouth daily.  Marland Kitchen atorvastatin (LIPITOR) 80 MG tablet Take 80 mg by mouth every evening.   Marland Kitchen CALCIUM POLYCARBOPHIL PO Take 3 tablets by mouth daily.  . Cholecalciferol (VITAMIN D3) 2000 units capsule Take 2,000 Units by mouth daily.   . clopidogrel (PLAVIX) 75 MG tablet Take 1 tablet (75 mg total) by mouth daily. (Patient taking differently: Take 75 mg by mouth at bedtime. )  . DULoxetine (CYMBALTA) 60 MG capsule Take 120 mg by mouth daily.   . furosemide (LASIX) 40 MG tablet Take 2 tablets (80 mg total) by mouth 2 (two) times daily.  . hydrocortisone (CORTEF) 5 MG tablet Take 5-15 mg by mouth See admin instructions. Take 3 tablets in the morning and take 1 tablet in the evening  . insulin degludec (TRESIBA FLEXTOUCH) 100 UNIT/ML SOPN FlexTouch Pen Inject 42 Units into the skin at bedtime.   . Iron-FA-B Cmp-C-Biot-Probiotic (FUSION PLUS) CAPS Take 1  capsule by mouth at bedtime.   Marland Kitchen levothyroxine (SYNTHROID, LEVOTHROID) 150 MCG tablet Take 150 mcg by mouth daily before breakfast.  . MAGNESIUM-OXIDE 400 (241.3 Mg) MG tablet Take 400 mg by mouth daily.  . nitroGLYCERIN (NITROSTAT) 0.4 MG SL tablet Place 1 tablet (0.4 mg total) under the tongue every 5 (five) minutes as needed for chest pain.  . OXYGEN Place 2 L into the nose continuous.   . polyethylene glycol (MIRALAX / GLYCOLAX) packet Take 17 g by mouth daily. (Patient taking differently: Take 17 g by mouth daily as needed for mild constipation. )  . potassium chloride SA (K-DUR) 20 MEQ tablet Take 2 tablets (40 mEq total) by mouth daily. Take when you take Torsemide.  . pramipexole (MIRAPEX) 1 MG tablet Take 1 mg by mouth 2 (two) times a day.  . Tiotropium Bromide-Olodaterol (STIOLTO RESPIMAT) 2.5-2.5 MCG/ACT AERS Inhale 2 puffs into the lungs daily.  . TRULICITY 1.5 FY/1.0FB SOPN Inject 1.5 mg into the skin every Sunday.   . zaleplon (SONATA) 10 MG capsule Take 10 mg by mouth at bedtime as needed for sleep.  Has medrol for Plan D also but not using               Objective:    10/08/2018       230  08/23/2018       224  05/21/2018       219   04/20/2018      21 9  02/28/18 215 lb (97.5 kg)  01/31/18 212 lb (96.2 kg)  01/31/18 212 lb (96.2 kg)      amb obese wf for first time in w/c "too far at your office)   Vital signs reviewed - Note on arrival 02 sats  100% on 3lpm pulsed       HEENT:  Full dentures/  nl oropharynx. Nl external ear canals without cough reflex -  Mild bilateral non-specific turbinate edema     NECK :  without JVD/Nodes/TM/ nl carotid upstrokes bilaterally   LUNGS: no acc muscle use,  Mod barrel  contour chest  wall with bilateral  Distant bs s audible wheeze and  without cough on insp or exp maneuver and mod  Hyperresonant  to  percussion bilaterally     CV:  RRR  no s3 or murmur or increase in P2, and no edema   ABD:  soft and nontender with pos mid  insp Hoover's  in the supine position. No bruits or organomegaly appreciated, bowel sounds nl  MS:     ext warm without deformities, calf tenderness, cyanosis or clubbing No obvious joint restrictions   SKIN: warm and dry without lesions    NEURO:  alert, approp, nl sensorium with  no motor or cerebellar deficits apparent.         I personally reviewed images and agree with radiology impression as follows:  CXR:   10/07/18 1. No acute cardiopulmonary disease. 2. Hyperexpanded lungs suggesting COPD.  Borderline cardiomegaly.     Assessment

## 2018-10-08 NOTE — Patient Instructions (Addendum)
Plan A = Automatic = stioloto 2 pffs each am   Work on inhaler technique:  relax and gently blow all the way out then take a nice smooth deep breath back in, triggering the inhaler at same time you start breathing in.  Hold for up to 5 seconds if you can. Rinse and gargle with water when done.    Plan B = Backup Only use your albuterol inhaler as a rescue medication to be used if you can't catch your breath by resting or doing a relaxed purse lip breathing pattern.  - The less you use it, the better it will work when you need it. - Ok to use the inhaler up to 2 puffs  every 4 hours if you must but call for appointment if use goes up over your usual need - Don't leave home without it !!  (think of it like the spare tire for your car)   Plan C = Crisis - only use your albuterol nebulizer if you first try Plan B and it fails to help > ok to use the nebulizer up to every 4 hours but if start needing it regularly call for immediate appointment   Plan D = Medrol  8mg  x 4 for one day, 3 x one day, 2 x one day ,and 1 or one day and off   Plan E = ER if all else fails   Please schedule a follow up visit in 3 months but call sooner if needed

## 2018-10-10 ENCOUNTER — Encounter: Payer: Self-pay | Admitting: Internal Medicine

## 2018-10-10 DIAGNOSIS — I5032 Chronic diastolic (congestive) heart failure: Secondary | ICD-10-CM | POA: Diagnosis not present

## 2018-10-10 DIAGNOSIS — E1142 Type 2 diabetes mellitus with diabetic polyneuropathy: Secondary | ICD-10-CM | POA: Diagnosis not present

## 2018-10-10 DIAGNOSIS — E1122 Type 2 diabetes mellitus with diabetic chronic kidney disease: Secondary | ICD-10-CM | POA: Diagnosis not present

## 2018-10-10 DIAGNOSIS — M62838 Other muscle spasm: Secondary | ICD-10-CM | POA: Diagnosis not present

## 2018-10-10 NOTE — Assessment & Plan Note (Signed)
First placed on 04/2013 HC03  01/17/18 =  33  HCO3  03/15/18 = 29 c/w improving hypercarbia - 04/20/2018   Walked 2lpm pulsed  3 laps @  approx 270ft each @ slow pace  stopped due to  End of study, no desat   - HCO3  08/08/18 = 33   As of 10/08/2018 default is 3lpm but encouraged to adjust to sats in lower 90s

## 2018-10-10 NOTE — Assessment & Plan Note (Signed)
Active smoker Spirometry 01/31/2018  FEV1 0.9 (39%)  Ratio 63 p am  trelegy - 01/31/2018  After extensive coaching inhaler device,  effectiveness =    75% (short ti) on smi > try stiolto x 2 week sample > improved sltly 02/28/2018  - alpha one AT screening 02/28/2018   MM  Level 148  - Spirometry 04/20/2018  FEV1 1.1 (46%)  Ratio 67  Off rx  - 08/23/2018 added Medrol 8 mg x 4 day taper for flares - 10/08/2018  After extensive coaching inhaler device,  effectiveness =    90% from baseline of 50%   Pt is Group B in terms of symptom/risk and laba/lama therefore appropriate rx at this point >>>  Continue stiolto with medrol backup in case of flares and if freq consider her group D > change to trelegy?

## 2018-10-10 NOTE — Assessment & Plan Note (Signed)

## 2018-10-11 ENCOUNTER — Telehealth: Payer: Self-pay | Admitting: Cardiology

## 2018-10-11 NOTE — Telephone Encounter (Signed)
Patient advised after reviewing her most recent lab results from her visit on 10/03/2018, Dr. Agustin Cree advised for her to increase furosemide 80 mg from once daily to twice daily. Patient verbalized understanding and states she will start taking the medication correctly today and come in for lab work on 10/18/2018. Also reminded her to take potassium 40 mEq daily. Patient is agreeable. No further questions.

## 2018-10-11 NOTE — Telephone Encounter (Signed)
Please call patient about confusion on the Furosemide, she was called in the 80 mg and that is what she is already taking and thinks should be more.Marland Kitchen

## 2018-10-16 ENCOUNTER — Encounter: Payer: Self-pay | Admitting: Gastroenterology

## 2018-10-17 ENCOUNTER — Telehealth: Payer: Self-pay | Admitting: Cardiology

## 2018-10-17 DIAGNOSIS — I255 Ischemic cardiomyopathy: Secondary | ICD-10-CM

## 2018-10-17 DIAGNOSIS — I5042 Chronic combined systolic (congestive) and diastolic (congestive) heart failure: Secondary | ICD-10-CM

## 2018-10-17 NOTE — Telephone Encounter (Signed)
Called patient. Let her know to have labs drawn. She verbally understands. No further questions.

## 2018-10-17 NOTE — Telephone Encounter (Signed)
Patient called to inform she is taking extra lasix and still not lost any water weight.

## 2018-10-17 NOTE — Telephone Encounter (Signed)
Check proBNP and chem7

## 2018-10-17 NOTE — Telephone Encounter (Signed)
Called patient she reports that with 80 mg of lasix twice a day she is still having shortness of breath. She reports her swelling in lower extremities has gotten better but she hasn't had any weight loss. Will consult with Dr. Agustin Cree.

## 2018-10-18 DIAGNOSIS — I5042 Chronic combined systolic (congestive) and diastolic (congestive) heart failure: Secondary | ICD-10-CM | POA: Diagnosis not present

## 2018-10-18 DIAGNOSIS — I255 Ischemic cardiomyopathy: Secondary | ICD-10-CM | POA: Diagnosis not present

## 2018-10-19 LAB — BASIC METABOLIC PANEL
BUN/Creatinine Ratio: 21 (ref 12–28)
BUN: 25 mg/dL (ref 8–27)
CO2: 28 mmol/L (ref 20–29)
Calcium: 9.5 mg/dL (ref 8.7–10.3)
Chloride: 95 mmol/L — ABNORMAL LOW (ref 96–106)
Creatinine, Ser: 1.19 mg/dL — ABNORMAL HIGH (ref 0.57–1.00)
GFR calc Af Amer: 56 mL/min/{1.73_m2} — ABNORMAL LOW (ref >59)
GFR calc non Af Amer: 49 mL/min/{1.73_m2} — ABNORMAL LOW (ref >59)
Glucose: 114 mg/dL — ABNORMAL HIGH (ref 65–99)
Potassium: 4.1 mmol/L (ref 3.5–5.2)
Sodium: 137 mmol/L (ref 134–144)

## 2018-10-19 LAB — PRO B NATRIURETIC PEPTIDE: NT-Pro BNP: 1150 pg/mL — ABNORMAL HIGH (ref 0–287)

## 2018-10-21 IMAGING — DX DG CHEST 2V
2 series · 2 of 2 positions shown · non-contrast
Comparison: 10/27/2017.

CLINICAL DATA: Shortness of breath for the past 3 days, worse this
morning and just prior to arrival today.

EXAM:
CHEST - 2 VIEW

[x chest ap]
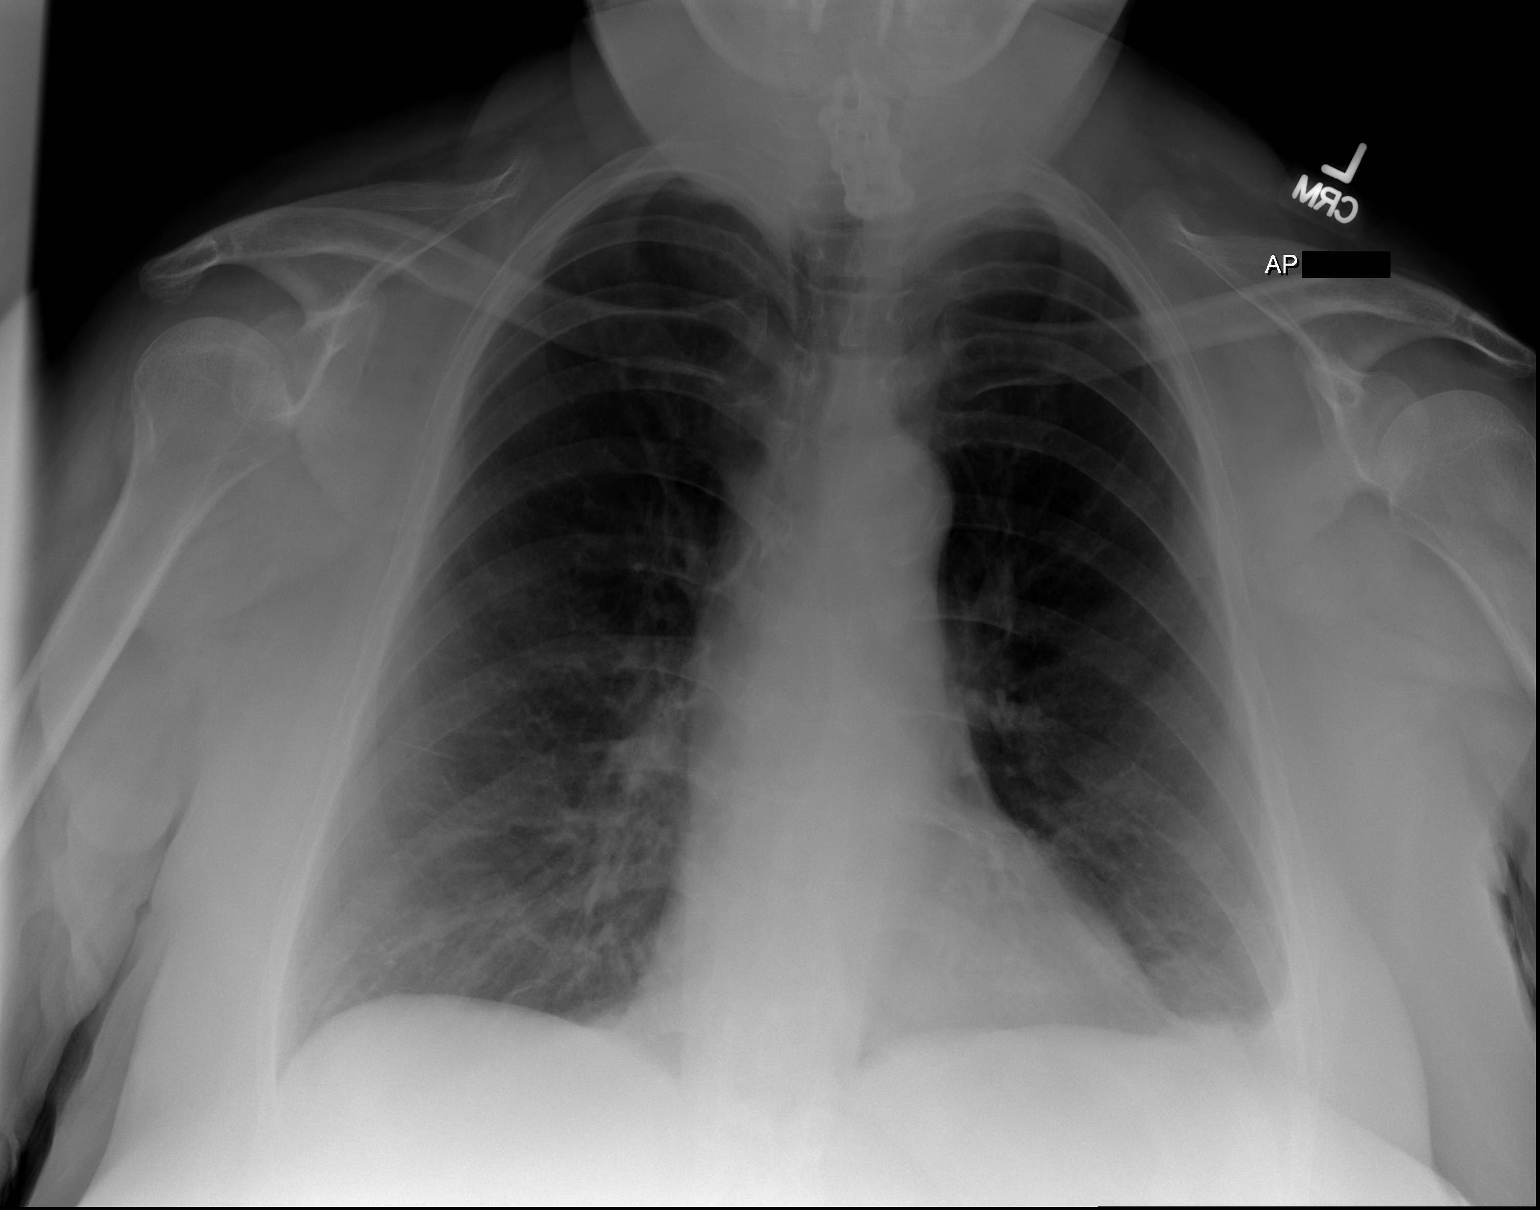

[w chest lat]
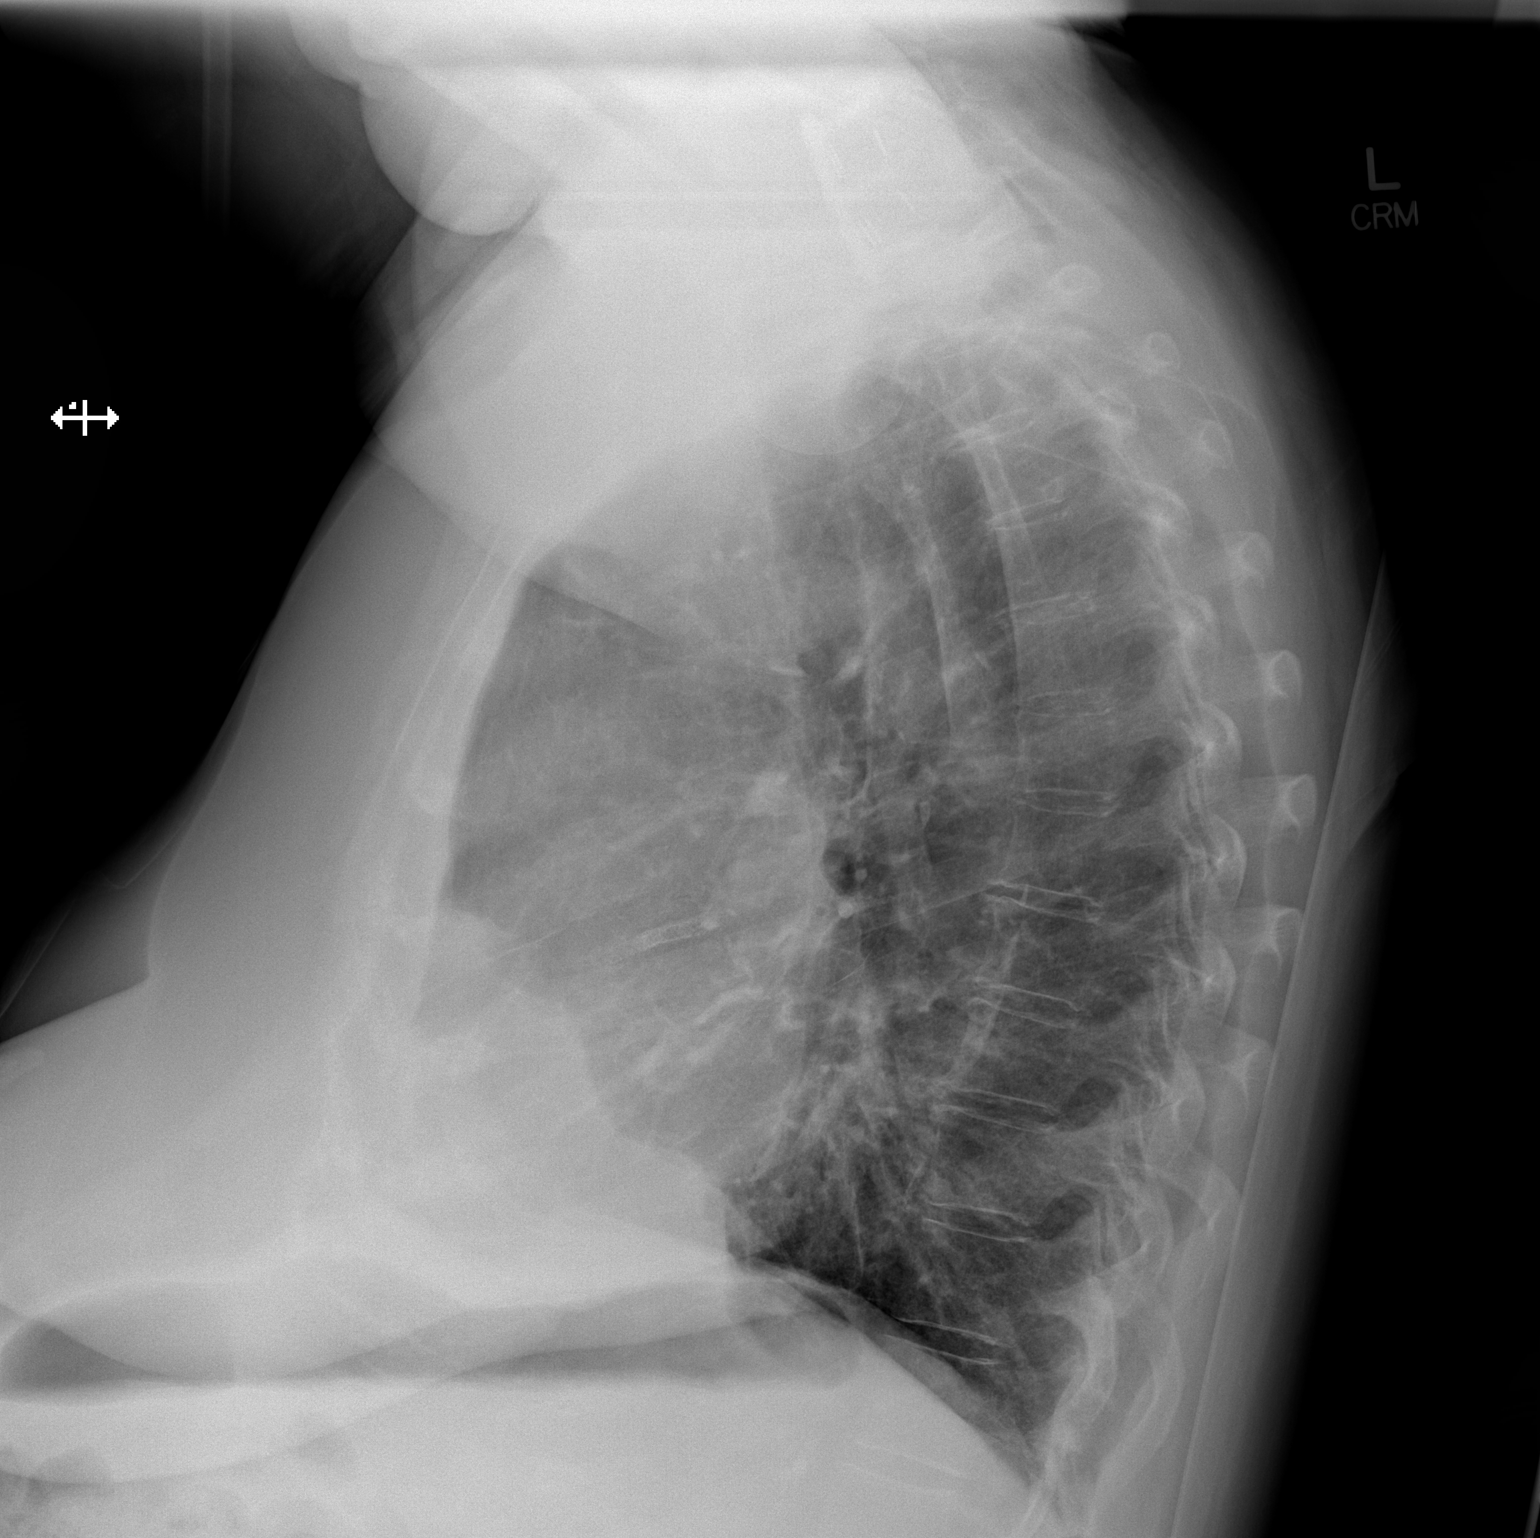

[2 of 2 positions shown; findings below may reference images not displayed]

FINDINGS: Normal sized heart. More confluent density at the left lateral lung
base. Otherwise, clear lungs. Coronary artery stents. No acute bony
abnormality. Cervical spine fixation hardware.
IMPRESSION: More consolidated atelectasis at the left lateral lung base. This
does not have the typical appearance of pneumonia.
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
## 2018-10-22 ENCOUNTER — Telehealth: Payer: Self-pay | Admitting: Cardiology

## 2018-10-22 NOTE — Telephone Encounter (Signed)
Still looking for results of bloodwork

## 2018-10-22 NOTE — Telephone Encounter (Signed)
Called patient and informed her of results.  

## 2018-10-25 ENCOUNTER — Encounter: Payer: Self-pay | Admitting: Nurse Practitioner

## 2018-10-25 ENCOUNTER — Telehealth: Payer: Self-pay | Admitting: Internal Medicine

## 2018-10-25 ENCOUNTER — Other Ambulatory Visit: Payer: Self-pay

## 2018-10-25 ENCOUNTER — Telehealth: Payer: Self-pay | Admitting: Cardiology

## 2018-10-25 ENCOUNTER — Ambulatory Visit (INDEPENDENT_AMBULATORY_CARE_PROVIDER_SITE_OTHER): Payer: Federal, State, Local not specified - PPO | Admitting: Nurse Practitioner

## 2018-10-25 DIAGNOSIS — Z20822 Contact with and (suspected) exposure to covid-19: Secondary | ICD-10-CM

## 2018-10-25 DIAGNOSIS — J441 Chronic obstructive pulmonary disease with (acute) exacerbation: Secondary | ICD-10-CM | POA: Diagnosis not present

## 2018-10-25 DIAGNOSIS — R6889 Other general symptoms and signs: Secondary | ICD-10-CM

## 2018-10-25 DIAGNOSIS — J449 Chronic obstructive pulmonary disease, unspecified: Secondary | ICD-10-CM | POA: Diagnosis not present

## 2018-10-25 MED ORDER — DOXYCYCLINE HYCLATE 100 MG PO TABS: 100.0000 mg | ORAL_TABLET | Freq: Two times a day (BID) | ORAL | 0 refills | Status: DC

## 2018-10-25 NOTE — Telephone Encounter (Signed)
Called patient back she is still short of breath. I informed her per Dr. Agustin Cree the labs were likely showing the shortness of breath coming from a lung issue. The patient plans to follow up with pulmonologist and call is if they aren't able to help.

## 2018-10-25 NOTE — Telephone Encounter (Signed)
Not feeling any better, feels worse

## 2018-10-25 NOTE — Assessment & Plan Note (Signed)
Patient has a tele-visit today for an acute visit.  She complains today of chest tightness, fever of 100.2- 100.9 F, increased shortness of breath, body aches.  She states that symptoms started 2 days ago (10/23/2018), she states that she does have a cough that is productive of thick white sputum.  Patient is compliant with Stiolto.  Patient does have albuterol to use as needed.  Unfortunately, patient continues to smoke.  Patient states that she has been self isolating but does admit that she has been shopping at [place] recently.   Concerned for possible Covid as well as pneumonia  Patient Instructions  Will order covid testing  Will order doxycycline  Please quit smoking  Continue all other medications:  Plan A = Automatic = stioloto 2 pffs each am   Work on inhaler technique: relax and gently blow all the way out then take a nice smooth deep breath back in, triggering the inhaler at same time you start breathing in. Hold for up to 5 seconds if you can. Rinse and gargle with water when done.   Plan B = Backup Only use your albuterol inhaler as a rescue medication to be used if you can't catch your breath by resting or doing a relaxed purse lip breathing pattern.  - The less you use it, the better it will work when you need it. - Ok to use the inhaler up to 2 puffs every 4 hours if you must but call for appointment if use goes up over your usual need - Don't leave home without it !! (think of it like the spare tire for your car)   Plan C = Crisis - only use your albuterol nebulizer if you first try Plan B and it fails to help >ok to use the nebulizer up to every 4 hours but if start needing it regularly call for immediate appointment  Follow up: Follow up with Dr. Melvyn Novas as scheduled or sooner if needed

## 2018-10-25 NOTE — Patient Instructions (Addendum)
Will order covid testing  Will order doxycycline  Please quit smoking  Continue all other medications:  Plan A = Automatic = stioloto 2 pffs each am   Work on inhaler technique: relax and gently blow all the way out then take a nice smooth deep breath back in, triggering the inhaler at same time you start breathing in. Hold for up to 5 seconds if you can. Rinse and gargle with water when done.   Plan B = Backup Only use your albuterol inhaler as a rescue medication to be used if you can't catch your breath by resting or doing a relaxed purse lip breathing pattern.  - The less you use it, the better it will work when you need it. - Ok to use the inhaler up to 2 puffs every 4 hours if you must but call for appointment if use goes up over your usual need - Don't leave home without it !! (think of it like the spare tire for your car)   Plan C = Crisis - only use your albuterol nebulizer if you first try Plan B and it fails to help >ok to use the nebulizer up to every 4 hours but if start needing it regularly call for immediate appointment  Follow up: Follow up with Dr. Melvyn Novas as scheduled or sooner if needed

## 2018-10-25 NOTE — Telephone Encounter (Signed)
Spoke with the pt  She states having chest tightness, fever (100.2-100.9), increased SOB, aches x 2 days  She thinks she may have PNA  She is coughing more and producing white sputum  She denies any GI symptoms or other co's  She denies any travel or sick contacts  Televisit with Tonya at 11:30 today

## 2018-10-25 NOTE — Progress Notes (Signed)
Virtual Visit via Telephone Note  I connected with Crystal Yates on 10/25/18 at 11:30 AM EDT by telephone and verified that I am speaking with the correct person using two identifiers.  Location: Patient: home Provider: office   I discussed the limitations, risks, security and privacy concerns of performing an evaluation and management service by telephone and the availability of in person appointments. I also discussed with the patient that there may be a patient responsible charge related to this service. The patient expressed understanding and agreed to proceed.   History of Present Illness: 63 year old female active smoker with COPD goldIII,chronic respiratory failure with hypoxia and hypercapnia who is followed by Dr. Melvyn Novas.  Patient has a tele-visit today for an acute visit.  She complains today of chest tightness, fever of 100.2- 100.9 F, increased shortness of breath, body aches.  She states that symptoms started 2 days ago (10/23/2018), she states that she does have a cough that is productive of thick white sputum.  Patient is compliant with Stiolto.  Patient does have albuterol to use as needed.  Unfortunately, patient continues to smoke.  Patient states that she has been self isolating but does admit that she has been shopping at [place] recently. Denies n/v/d, hemoptysis, PND, leg swelling.    Observations/Objective: Active smoker Spirometry 01/31/2018 FEV1 0.9 (39%) Ratio 63 p am trelegy - 01/31/2018 After extensive coaching inhaler device, effectiveness = 75% (short ti) on smi >try stiolto x 2 week sample >improved sltly 02/28/2018  - alpha one AT screening 02/28/2018 MMLevel 148  - Spirometry 04/20/2018 FEV1 1.1 (46%) Ratio 67 Off rx  - 08/23/2018 After extensive coaching inhaler device, effectiveness = 75% (short Ti, inconsistent trigger with onset of insp)  - 08/23/2018 added Medrol 8 mg x 4 day taper for flares  -CXR 09/05/18 -  Borderline  cardiomegaly without failure. Suspect pleural effusions, left greater than right. Mild bibasilar airspace disease likely reflects atelectasis. Infection, particularly at the left base, is not excluded.  Assessment and Plan: Patient has a tele-visit today for an acute visit.  She complains today of chest tightness, fever of 100.2- 100.9 F, increased shortness of breath, body aches.  She states that symptoms started 2 days ago (10/23/2018), she states that she does have a cough that is productive of thick white sputum.  Patient is compliant with Stiolto.  Patient does have albuterol to use as needed.  Unfortunately, patient continues to smoke.  Patient states that she has been self isolating but does admit that she has been shopping at [place] recently.   Concerned for possible Covid as well as pneumonia  Patient Instructions  Will order covid testing  Will order doxycycline  Please quit smoking  Continue all other medications:  Plan A = Automatic = stioloto 2 pffs each am   Work on inhaler technique: relax and gently blow all the way out then take a nice smooth deep breath back in, triggering the inhaler at same time you start breathing in. Hold for up to 5 seconds if you can. Rinse and gargle with water when done.   Plan B = Backup Only use your albuterol inhaler as a rescue medication to be used if you can't catch your breath by resting or doing a relaxed purse lip breathing pattern.  - The less you use it, the better it will work when you need it. - Ok to use the inhaler up to 2 puffs every 4 hours if you must but call for appointment if  use goes up over your usual need - Don't leave home without it !! (think of it like the spare tire for your car)   Plan C = Crisis - only use your albuterol nebulizer if you first try Plan B and it fails to help >ok to use the nebulizer up to every 4 hours but if start needing it regularly call for immediate appointment    Follow Up  Instructions: Follow up with Dr. Melvyn Novas as scheduled or sooner if needed    I discussed the assessment and treatment plan with the patient. The patient was provided an opportunity to ask questions and all were answered. The patient agreed with the plan and demonstrated an understanding of the instructions.   The patient was advised to call back or seek an in-person evaluation if the symptoms worsen or if the condition fails to improve as anticipated.  I provided 22 minutes of non-face-to-face time during this encounter.   Fenton Foy, NP

## 2018-10-26 ENCOUNTER — Emergency Department (HOSPITAL_COMMUNITY): Payer: Federal, State, Local not specified - PPO

## 2018-10-26 ENCOUNTER — Emergency Department (HOSPITAL_COMMUNITY)
Admission: EM | Admit: 2018-10-26 | Discharge: 2018-10-26 | Disposition: A | Discharge status: Home / Self Care | Payer: Federal, State, Local not specified - PPO | Attending: Emergency Medicine | Admitting: Emergency Medicine

## 2018-10-26 ENCOUNTER — Encounter (HOSPITAL_COMMUNITY): Payer: Self-pay | Admitting: Emergency Medicine

## 2018-10-26 ENCOUNTER — Other Ambulatory Visit: Payer: Self-pay

## 2018-10-26 DIAGNOSIS — E039 Hypothyroidism, unspecified: Secondary | ICD-10-CM | POA: Insufficient documentation

## 2018-10-26 DIAGNOSIS — N182 Chronic kidney disease, stage 2 (mild): Secondary | ICD-10-CM | POA: Diagnosis not present

## 2018-10-26 DIAGNOSIS — Z20822 Contact with and (suspected) exposure to covid-19: Secondary | ICD-10-CM

## 2018-10-26 DIAGNOSIS — E1122 Type 2 diabetes mellitus with diabetic chronic kidney disease: Secondary | ICD-10-CM | POA: Insufficient documentation

## 2018-10-26 DIAGNOSIS — Z955 Presence of coronary angioplasty implant and graft: Secondary | ICD-10-CM | POA: Insufficient documentation

## 2018-10-26 DIAGNOSIS — J449 Chronic obstructive pulmonary disease, unspecified: Secondary | ICD-10-CM | POA: Insufficient documentation

## 2018-10-26 DIAGNOSIS — R05 Cough: Secondary | ICD-10-CM | POA: Insufficient documentation

## 2018-10-26 DIAGNOSIS — F17210 Nicotine dependence, cigarettes, uncomplicated: Secondary | ICD-10-CM | POA: Diagnosis not present

## 2018-10-26 DIAGNOSIS — Z794 Long term (current) use of insulin: Secondary | ICD-10-CM | POA: Diagnosis not present

## 2018-10-26 DIAGNOSIS — R0602 Shortness of breath: Secondary | ICD-10-CM | POA: Diagnosis not present

## 2018-10-26 DIAGNOSIS — I130 Hypertensive heart and chronic kidney disease with heart failure and stage 1 through stage 4 chronic kidney disease, or unspecified chronic kidney disease: Secondary | ICD-10-CM | POA: Insufficient documentation

## 2018-10-26 DIAGNOSIS — Z79899 Other long term (current) drug therapy: Secondary | ICD-10-CM | POA: Insufficient documentation

## 2018-10-26 DIAGNOSIS — I5042 Chronic combined systolic (congestive) and diastolic (congestive) heart failure: Secondary | ICD-10-CM | POA: Diagnosis not present

## 2018-10-26 DIAGNOSIS — Z7982 Long term (current) use of aspirin: Secondary | ICD-10-CM | POA: Diagnosis not present

## 2018-10-26 DIAGNOSIS — R0789 Other chest pain: Secondary | ICD-10-CM | POA: Insufficient documentation

## 2018-10-26 DIAGNOSIS — Z7902 Long term (current) use of antithrombotics/antiplatelets: Secondary | ICD-10-CM | POA: Diagnosis not present

## 2018-10-26 DIAGNOSIS — Z20828 Contact with and (suspected) exposure to other viral communicable diseases: Secondary | ICD-10-CM | POA: Diagnosis not present

## 2018-10-26 LAB — CBC
HCT: 41.4 % (ref 36.0–46.0)
Hemoglobin: 13.5 g/dL (ref 12.0–15.0)
MCH: 30 pg (ref 26.0–34.0)
MCHC: 32.6 g/dL (ref 30.0–36.0)
MCV: 92 fL (ref 80.0–100.0)
Platelets: 213 10*3/uL (ref 150–400)
RBC: 4.5 MIL/uL (ref 3.87–5.11)
RDW: 14.5 % (ref 11.5–15.5)
WBC: 14 10*3/uL — ABNORMAL HIGH (ref 4.0–10.5)
nRBC: 0 % (ref 0.0–0.2)

## 2018-10-26 LAB — BASIC METABOLIC PANEL
Anion gap: 10 (ref 5–15)
BUN: 19 mg/dL (ref 8–23)
CO2: 29 mmol/L (ref 22–32)
Calcium: 9.7 mg/dL (ref 8.9–10.3)
Chloride: 97 mmol/L — ABNORMAL LOW (ref 98–111)
Creatinine, Ser: 1.12 mg/dL — ABNORMAL HIGH (ref 0.44–1.00)
GFR calc Af Amer: >60 mL/min (ref >60)
GFR calc non Af Amer: 52 mL/min — ABNORMAL LOW (ref >60)
Glucose, Bld: 416 mg/dL — ABNORMAL HIGH (ref 70–99)
Potassium: 4.6 mmol/L (ref 3.5–5.1)
Sodium: 136 mmol/L (ref 135–145)

## 2018-10-26 LAB — TROPONIN I (HIGH SENSITIVITY)
Troponin I (High Sensitivity): 39 ng/L — ABNORMAL HIGH (ref <18)
Troponin I (High Sensitivity): 35 ng/L — ABNORMAL HIGH (ref <18)

## 2018-10-26 LAB — BRAIN NATRIURETIC PEPTIDE: B Natriuretic Peptide: 216 pg/mL — ABNORMAL HIGH (ref 0.0–100.0)

## 2018-10-26 LAB — LACTIC ACID, PLASMA: Lactic Acid, Venous: 0.9 mmol/L (ref 0.5–1.9)

## 2018-10-26 MED ORDER — ALBUTEROL SULFATE HFA 108 (90 BASE) MCG/ACT IN AERS
4.0000 | INHALATION_SPRAY | RESPIRATORY_TRACT | Status: DC | PRN
  Administered 2018-10-26: 4 via RESPIRATORY_TRACT
  Filled 2018-10-26: qty 6.7

## 2018-10-26 MED ORDER — SODIUM CHLORIDE 0.9% FLUSH: 3.0000 mL | Freq: Once | INTRAVENOUS | Status: DC

## 2018-10-26 NOTE — ED Notes (Signed)
Ambulated Pt through [place]. Pt maintained O2 saturation around 97-98 for most of the time and dropped as low as 96 and reached as high as 99. Pt was verbal and stated she just became slightly short of breath towards the end. No dizziness stated and pt. Is back in bed.

## 2018-10-26 NOTE — ED Triage Notes (Signed)
Pt here for eval of SOB that has been going on for a month, worse the past week, she attributes it to CHF. PT also reports chest pain 4/10. Wears 3L O2 at all times. Pt has COPD. 98% on her 3L. Pt endorses some nausea this morning. Afebrile. States she has not had any luck with her diuretic.

## 2018-10-26 NOTE — Discharge Instructions (Addendum)
Please self quarantine at home while awaiting for your COVID-19 test to result.  Take doxycycline as previously prescribed for potential pneumonia.  Use albuterol inhaler 2 puffs every 4 hours as needed for shortness of breath.  Return promptly to the ED if you develop worsening shortness of breath had fever like you have any other concern. Use albuterol inhaler 2-4 puffs every 4 hours as needed for shortness of breath.

## 2018-10-26 NOTE — ED Provider Notes (Signed)
Medical screening examination/treatment/procedure(s) were conducted as a shared visit with non-physician practitioner(s) and myself.  I personally evaluated the patient during the encounter. Briefly, the patient is a 63 y.o. female with history of COPD on 3 L of oxygen, heart failure, CAD who presents the ED with cough, shortness of breath.  Patient started on doxycycline yesterday for possible infection.  Is under investigation for coronavirus.  States that she continues to feel bad but no major changes since yesterday.  Continues to be on 3 L of oxygen.  No signs of volume overload on exam.  Patient with chest x-ray that shows atelectasis versus possible infiltrate versus possible viral changes.  Overall patient has normal work of breathing.  Fairly clear breath sounds.  Troponin x2 at baseline.  BNP is at baseline.  Doubt ACS or heart failure.  EKG also shows no ischemic changes.  Overall could be bacterial versus viral process in the lungs.  Could overall be chronic issues as well.  She is compliant with her diuretics.  Recommend that she continue doxycycline and continue self isolation for coronavirus at this time.  Overall, she does not appear to meet any admission criteria.  She was given very strict return precautions especially if she does test positive for coronavirus.  However she appears to be at her respiratory baseline.  Discharged in the ED in good condition.  Understands return precautions.  This chart was dictated using voice recognition software.  Despite best efforts to proofread,  errors can occur which can change the documentation meaning.     EKG Interpretation  Date/Time:  Friday October 26 2018 14:03:41 EDT Ventricular Rate:  98 PR Interval:  136 QRS Duration: 70 QT Interval:  388 QTC Calculation: 495 R Axis:   65 Text Interpretation:  Normal sinus rhythm Biatrial enlargement Nonspecific ST abnormality Abnormal ECG Confirmed by Lennice Sites [phone]) on 10/26/2018 3:49:07 PM            Lennice Sites, DO 10/28/18 1709

## 2018-10-26 NOTE — ED Notes (Signed)
Pt ambulated independently to bathroom with 3L [place] (baseline at home) tolerated well, minimal shortness of breath reported, O2 saturation 99% upon arrival to bed.
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
## 2018-10-27 LAB — NOVEL CORONAVIRUS, NAA: SARS-CoV-2, NAA: NOT DETECTED

## 2018-10-29 NOTE — Progress Notes (Signed)
Chart and office note reviewed in detail  > agree with a/p as outlined    

## 2018-10-31 ENCOUNTER — Telehealth: Payer: Self-pay | Admitting: Gastroenterology

## 2018-10-31 NOTE — Telephone Encounter (Signed)
Hi Dr. Lyndel Safe, this patient called this morning looking to schedule and appointment with you, last time she saw you was in 2017, she recently saw Dr. Marin Comment (GI MD at [facility]); records are in [place]. She would like to transfer her Gi care back to you again. Will you take patient back? Please advise. Thank you.

## 2018-11-01 NOTE — Telephone Encounter (Signed)
No problems at all Happy to see her RG

## 2018-11-02 DIAGNOSIS — R911 Solitary pulmonary nodule: Secondary | ICD-10-CM | POA: Diagnosis not present

## 2018-11-02 DIAGNOSIS — F17210 Nicotine dependence, cigarettes, uncomplicated: Secondary | ICD-10-CM | POA: Diagnosis not present

## 2018-11-02 DIAGNOSIS — J449 Chronic obstructive pulmonary disease, unspecified: Secondary | ICD-10-CM | POA: Diagnosis not present

## 2018-11-02 DIAGNOSIS — G4733 Obstructive sleep apnea (adult) (pediatric): Secondary | ICD-10-CM | POA: Diagnosis not present

## 2018-11-05 ENCOUNTER — Encounter: Payer: Self-pay | Admitting: Gastroenterology

## 2018-11-05 NOTE — Telephone Encounter (Signed)
Consult scheduled on 8/28 at 10:20am.

## 2018-11-08 ENCOUNTER — Telehealth: Payer: Self-pay | Admitting: Cardiology

## 2018-11-08 DIAGNOSIS — I5042 Chronic combined systolic (congestive) and diastolic (congestive) heart failure: Secondary | ICD-10-CM

## 2018-11-08 NOTE — Telephone Encounter (Signed)
Stop furosemide.  Zaroxolyn 2.5 mg daily and Chem-7 on Monday.  Keep a track of blood pressures.

## 2018-11-08 NOTE — Telephone Encounter (Signed)
She is having increased SOB throughout day, both legs are edemedous and shiny. BP 122/69 HR 104. She feels as if she is not peeing as much along with the 7-8 pound weight gain. She weighed 230 lbs today. Information relayed to Dr. Docia Furl for advisement.

## 2018-11-08 NOTE — Telephone Encounter (Signed)
Patient called stating she has put on 7-8 pounds of water weight in the last couple days. Please advise

## 2018-11-09 ENCOUNTER — Telehealth: Payer: Self-pay | Admitting: Cardiology

## 2018-11-09 MED ORDER — METOLAZONE 2.5 MG PO TABS: 2.5000 mg | ORAL_TABLET | Freq: Every day | ORAL | 2 refills | Status: DC

## 2018-11-09 NOTE — Telephone Encounter (Signed)
See prior encounter

## 2018-11-09 NOTE — Telephone Encounter (Signed)
Telephone call to patient. Informed her that Dr. Geraldo Pitter wants her to stop furosemide and start zaroxolyn 2.5 mg daily and get BMP on Monday. Also to track her blood pressures  Daily. Patient verbalized understanding.

## 2018-11-11 ENCOUNTER — Encounter (HOSPITAL_COMMUNITY): Payer: Self-pay | Admitting: Emergency Medicine

## 2018-11-11 ENCOUNTER — Other Ambulatory Visit: Payer: Self-pay

## 2018-11-11 ENCOUNTER — Emergency Department (HOSPITAL_COMMUNITY): Payer: Federal, State, Local not specified - PPO

## 2018-11-11 ENCOUNTER — Emergency Department (HOSPITAL_COMMUNITY)
Admission: EM | Admit: 2018-11-11 | Discharge: 2018-11-11 | Disposition: A | Discharge status: Home / Self Care | Payer: Federal, State, Local not specified - PPO | Attending: Emergency Medicine | Admitting: Emergency Medicine

## 2018-11-11 DIAGNOSIS — I2510 Atherosclerotic heart disease of native coronary artery without angina pectoris: Secondary | ICD-10-CM | POA: Diagnosis not present

## 2018-11-11 DIAGNOSIS — I110 Hypertensive heart disease with heart failure: Secondary | ICD-10-CM | POA: Diagnosis not present

## 2018-11-11 DIAGNOSIS — F17210 Nicotine dependence, cigarettes, uncomplicated: Secondary | ICD-10-CM | POA: Insufficient documentation

## 2018-11-11 DIAGNOSIS — I130 Hypertensive heart and chronic kidney disease with heart failure and stage 1 through stage 4 chronic kidney disease, or unspecified chronic kidney disease: Secondary | ICD-10-CM | POA: Diagnosis not present

## 2018-11-11 DIAGNOSIS — E039 Hypothyroidism, unspecified: Secondary | ICD-10-CM | POA: Insufficient documentation

## 2018-11-11 DIAGNOSIS — I509 Heart failure, unspecified: Secondary | ICD-10-CM

## 2018-11-11 DIAGNOSIS — Z79899 Other long term (current) drug therapy: Secondary | ICD-10-CM | POA: Insufficient documentation

## 2018-11-11 DIAGNOSIS — Z794 Long term (current) use of insulin: Secondary | ICD-10-CM | POA: Diagnosis not present

## 2018-11-11 DIAGNOSIS — E1122 Type 2 diabetes mellitus with diabetic chronic kidney disease: Secondary | ICD-10-CM | POA: Insufficient documentation

## 2018-11-11 DIAGNOSIS — J449 Chronic obstructive pulmonary disease, unspecified: Secondary | ICD-10-CM | POA: Diagnosis not present

## 2018-11-11 DIAGNOSIS — Z7982 Long term (current) use of aspirin: Secondary | ICD-10-CM | POA: Diagnosis not present

## 2018-11-11 DIAGNOSIS — R0602 Shortness of breath: Secondary | ICD-10-CM | POA: Diagnosis not present

## 2018-11-11 DIAGNOSIS — I5042 Chronic combined systolic (congestive) and diastolic (congestive) heart failure: Secondary | ICD-10-CM | POA: Diagnosis not present

## 2018-11-11 DIAGNOSIS — R000 Tachycardia, unspecified: Secondary | ICD-10-CM | POA: Diagnosis not present

## 2018-11-11 DIAGNOSIS — R079 Chest pain, unspecified: Secondary | ICD-10-CM | POA: Diagnosis not present

## 2018-11-11 DIAGNOSIS — N182 Chronic kidney disease, stage 2 (mild): Secondary | ICD-10-CM | POA: Insufficient documentation

## 2018-11-11 LAB — BASIC METABOLIC PANEL
Anion gap: 9 (ref 5–15)
BUN: 16 mg/dL (ref 8–23)
CO2: 30 mmol/L (ref 22–32)
Calcium: 10.2 mg/dL (ref 8.9–10.3)
Chloride: 99 mmol/L (ref 98–111)
Creatinine, Ser: 1.14 mg/dL — ABNORMAL HIGH (ref 0.44–1.00)
GFR calc Af Amer: 59 mL/min — ABNORMAL LOW (ref >60)
GFR calc non Af Amer: 51 mL/min — ABNORMAL LOW (ref >60)
Glucose, Bld: 200 mg/dL — ABNORMAL HIGH (ref 70–99)
Potassium: 3.5 mmol/L (ref 3.5–5.1)
Sodium: 138 mmol/L (ref 135–145)

## 2018-11-11 LAB — CBC
HCT: 41.6 % (ref 36.0–46.0)
Hemoglobin: 13.3 g/dL (ref 12.0–15.0)
MCH: 29.5 pg (ref 26.0–34.0)
MCHC: 32 g/dL (ref 30.0–36.0)
MCV: 92.2 fL (ref 80.0–100.0)
Platelets: 225 10*3/uL (ref 150–400)
RBC: 4.51 MIL/uL (ref 3.87–5.11)
RDW: 14.6 % (ref 11.5–15.5)
WBC: 9.4 10*3/uL (ref 4.0–10.5)
nRBC: 0 % (ref 0.0–0.2)

## 2018-11-11 LAB — TROPONIN I (HIGH SENSITIVITY)
Troponin I (High Sensitivity): 41 ng/L — ABNORMAL HIGH (ref <18)
Troponin I (High Sensitivity): 39 ng/L — ABNORMAL HIGH (ref <18)

## 2018-11-11 LAB — BRAIN NATRIURETIC PEPTIDE: B Natriuretic Peptide: 253.4 pg/mL — ABNORMAL HIGH (ref 0.0–100.0)

## 2018-11-11 MED ORDER — ALBUTEROL SULFATE HFA 108 (90 BASE) MCG/ACT IN AERS
4.0000 | INHALATION_SPRAY | Freq: Once | RESPIRATORY_TRACT | Status: AC
  Administered 2018-11-11: 4 via RESPIRATORY_TRACT
  Filled 2018-11-11: qty 6.7

## 2018-11-11 MED ORDER — SODIUM CHLORIDE 0.9% FLUSH: 3.0000 mL | Freq: Once | INTRAVENOUS | Status: DC

## 2018-11-11 MED ORDER — DEXAMETHASONE SODIUM PHOSPHATE 10 MG/ML IJ SOLN
10.0000 mg | Freq: Once | INTRAMUSCULAR | Status: AC
  Administered 2018-11-11: 10 mg via INTRAVENOUS
  Filled 2018-11-11: qty 1

## 2018-11-11 MED ORDER — FUROSEMIDE 10 MG/ML IJ SOLN
100.0000 mg | Freq: Once | INTRAVENOUS | Status: AC
  Administered 2018-11-11: 100 mg via INTRAVENOUS
  Filled 2018-11-11: qty 10

## 2018-11-11 NOTE — ED Triage Notes (Signed)
Pt c/o swelling to bilat lower ext, and increased shob x 3 days, 02 usage at home up to 3L. Intermittent cp and abd pain.

## 2018-11-11 NOTE — Discharge Instructions (Addendum)
You were seen today for shortness of breath.  This may be related to accommodation of your CHF and COPD.  Follow-up closely with your cardiologist for titration of your medications.

## 2018-11-11 NOTE — ED Notes (Signed)
Patient verbalizes understanding of discharge instructions. Opportunity for questioning and answers were provided. Armband removed by staff, pt discharged from ED.  
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
## 2018-11-13 DIAGNOSIS — G4733 Obstructive sleep apnea (adult) (pediatric): Secondary | ICD-10-CM | POA: Diagnosis not present

## 2018-11-13 DIAGNOSIS — G4709 Other insomnia: Secondary | ICD-10-CM | POA: Diagnosis not present

## 2018-11-13 DIAGNOSIS — I5032 Chronic diastolic (congestive) heart failure: Secondary | ICD-10-CM | POA: Diagnosis not present

## 2018-11-13 DIAGNOSIS — J449 Chronic obstructive pulmonary disease, unspecified: Secondary | ICD-10-CM | POA: Diagnosis not present

## 2018-11-14 ENCOUNTER — Encounter (HOSPITAL_COMMUNITY): Payer: Self-pay

## 2018-11-14 ENCOUNTER — Emergency Department (HOSPITAL_COMMUNITY): Payer: Federal, State, Local not specified - PPO

## 2018-11-14 ENCOUNTER — Inpatient Hospital Stay (HOSPITAL_COMMUNITY)
Admission: EM | Admit: 2018-11-14 | Discharge: 2018-11-18 | DRG: 208 | Disposition: A | Discharge status: Home / Self Care | Payer: Federal, State, Local not specified - PPO | Attending: Internal Medicine | Admitting: Internal Medicine

## 2018-11-14 ENCOUNTER — Other Ambulatory Visit: Payer: Self-pay

## 2018-11-14 DIAGNOSIS — E872 Acidosis, unspecified: Secondary | ICD-10-CM

## 2018-11-14 DIAGNOSIS — E236 Other disorders of pituitary gland: Secondary | ICD-10-CM | POA: Diagnosis not present

## 2018-11-14 DIAGNOSIS — F17210 Nicotine dependence, cigarettes, uncomplicated: Secondary | ICD-10-CM | POA: Diagnosis present

## 2018-11-14 DIAGNOSIS — N179 Acute kidney failure, unspecified: Secondary | ICD-10-CM | POA: Diagnosis not present

## 2018-11-14 DIAGNOSIS — Z79890 Hormone replacement therapy (postmenopausal): Secondary | ICD-10-CM

## 2018-11-14 DIAGNOSIS — R911 Solitary pulmonary nodule: Secondary | ICD-10-CM | POA: Diagnosis not present

## 2018-11-14 DIAGNOSIS — I2510 Atherosclerotic heart disease of native coronary artery without angina pectoris: Secondary | ICD-10-CM | POA: Diagnosis not present

## 2018-11-14 DIAGNOSIS — R0902 Hypoxemia: Secondary | ICD-10-CM | POA: Diagnosis not present

## 2018-11-14 DIAGNOSIS — Z7952 Long term (current) use of systemic steroids: Secondary | ICD-10-CM

## 2018-11-14 DIAGNOSIS — E1122 Type 2 diabetes mellitus with diabetic chronic kidney disease: Secondary | ICD-10-CM | POA: Diagnosis not present

## 2018-11-14 DIAGNOSIS — T380X5A Adverse effect of glucocorticoids and synthetic analogues, initial encounter: Secondary | ICD-10-CM | POA: Diagnosis present

## 2018-11-14 DIAGNOSIS — Z9981 Dependence on supplemental oxygen: Secondary | ICD-10-CM | POA: Diagnosis not present

## 2018-11-14 DIAGNOSIS — G4733 Obstructive sleep apnea (adult) (pediatric): Secondary | ICD-10-CM | POA: Diagnosis present

## 2018-11-14 DIAGNOSIS — J9600 Acute respiratory failure, unspecified whether with hypoxia or hypercapnia: Secondary | ICD-10-CM | POA: Diagnosis present

## 2018-11-14 DIAGNOSIS — Z833 Family history of diabetes mellitus: Secondary | ICD-10-CM

## 2018-11-14 DIAGNOSIS — I130 Hypertensive heart and chronic kidney disease with heart failure and stage 1 through stage 4 chronic kidney disease, or unspecified chronic kidney disease: Secondary | ICD-10-CM | POA: Diagnosis not present

## 2018-11-14 DIAGNOSIS — R0689 Other abnormalities of breathing: Secondary | ICD-10-CM | POA: Diagnosis not present

## 2018-11-14 DIAGNOSIS — J9621 Acute and chronic respiratory failure with hypoxia: Secondary | ICD-10-CM | POA: Diagnosis not present

## 2018-11-14 DIAGNOSIS — J449 Chronic obstructive pulmonary disease, unspecified: Secondary | ICD-10-CM

## 2018-11-14 DIAGNOSIS — R6510 Systemic inflammatory response syndrome (SIRS) of non-infectious origin without acute organ dysfunction: Secondary | ICD-10-CM | POA: Diagnosis present

## 2018-11-14 DIAGNOSIS — Z9119 Patient's noncompliance with other medical treatment and regimen: Secondary | ICD-10-CM

## 2018-11-14 DIAGNOSIS — J9622 Acute and chronic respiratory failure with hypercapnia: Secondary | ICD-10-CM | POA: Diagnosis present

## 2018-11-14 DIAGNOSIS — J9611 Chronic respiratory failure with hypoxia: Secondary | ICD-10-CM | POA: Diagnosis not present

## 2018-11-14 DIAGNOSIS — G9340 Encephalopathy, unspecified: Secondary | ICD-10-CM | POA: Diagnosis not present

## 2018-11-14 DIAGNOSIS — J441 Chronic obstructive pulmonary disease with (acute) exacerbation: Secondary | ICD-10-CM | POA: Diagnosis not present

## 2018-11-14 DIAGNOSIS — Z7982 Long term (current) use of aspirin: Secondary | ICD-10-CM | POA: Diagnosis not present

## 2018-11-14 DIAGNOSIS — I517 Cardiomegaly: Secondary | ICD-10-CM | POA: Diagnosis not present

## 2018-11-14 DIAGNOSIS — Z794 Long term (current) use of insulin: Secondary | ICD-10-CM | POA: Diagnosis not present

## 2018-11-14 DIAGNOSIS — N183 Chronic kidney disease, stage 3 unspecified: Secondary | ICD-10-CM

## 2018-11-14 DIAGNOSIS — Z7902 Long term (current) use of antithrombotics/antiplatelets: Secondary | ICD-10-CM

## 2018-11-14 DIAGNOSIS — A419 Sepsis, unspecified organism: Secondary | ICD-10-CM

## 2018-11-14 DIAGNOSIS — R0989 Other specified symptoms and signs involving the circulatory and respiratory systems: Secondary | ICD-10-CM | POA: Diagnosis not present

## 2018-11-14 DIAGNOSIS — I5043 Acute on chronic combined systolic (congestive) and diastolic (congestive) heart failure: Secondary | ICD-10-CM | POA: Diagnosis present

## 2018-11-14 DIAGNOSIS — M5137 Other intervertebral disc degeneration, lumbosacral region: Secondary | ICD-10-CM | POA: Diagnosis present

## 2018-11-14 DIAGNOSIS — J9690 Respiratory failure, unspecified, unspecified whether with hypoxia or hypercapnia: Secondary | ICD-10-CM

## 2018-11-14 DIAGNOSIS — E785 Hyperlipidemia, unspecified: Secondary | ICD-10-CM | POA: Diagnosis present

## 2018-11-14 DIAGNOSIS — J9601 Acute respiratory failure with hypoxia: Secondary | ICD-10-CM | POA: Diagnosis not present

## 2018-11-14 DIAGNOSIS — R609 Edema, unspecified: Secondary | ICD-10-CM

## 2018-11-14 DIAGNOSIS — Z823 Family history of stroke: Secondary | ICD-10-CM | POA: Diagnosis not present

## 2018-11-14 DIAGNOSIS — Z91048 Other nonmedicinal substance allergy status: Secondary | ICD-10-CM

## 2018-11-14 DIAGNOSIS — Z955 Presence of coronary angioplasty implant and graft: Secondary | ICD-10-CM | POA: Diagnosis not present

## 2018-11-14 DIAGNOSIS — R6520 Severe sepsis without septic shock: Secondary | ICD-10-CM | POA: Diagnosis not present

## 2018-11-14 DIAGNOSIS — Z20828 Contact with and (suspected) exposure to other viral communicable diseases: Secondary | ICD-10-CM | POA: Diagnosis present

## 2018-11-14 DIAGNOSIS — K5730 Diverticulosis of large intestine without perforation or abscess without bleeding: Secondary | ICD-10-CM | POA: Diagnosis present

## 2018-11-14 DIAGNOSIS — I255 Ischemic cardiomyopathy: Secondary | ICD-10-CM | POA: Diagnosis present

## 2018-11-14 DIAGNOSIS — E1165 Type 2 diabetes mellitus with hyperglycemia: Secondary | ICD-10-CM | POA: Diagnosis not present

## 2018-11-14 DIAGNOSIS — Z888 Allergy status to other drugs, medicaments and biological substances status: Secondary | ICD-10-CM

## 2018-11-14 DIAGNOSIS — Z881 Allergy status to other antibiotic agents status: Secondary | ICD-10-CM

## 2018-11-14 DIAGNOSIS — R069 Unspecified abnormalities of breathing: Secondary | ICD-10-CM | POA: Diagnosis not present

## 2018-11-14 DIAGNOSIS — R0602 Shortness of breath: Secondary | ICD-10-CM | POA: Diagnosis not present

## 2018-11-14 LAB — POCT I-STAT 7, (LYTES, BLD GAS, ICA,H+H)
Acid-Base Excess: 5 mmol/L — ABNORMAL HIGH (ref 0.0–2.0)
Bicarbonate: 35 mmol/L — ABNORMAL HIGH (ref 20.0–28.0)
Calcium, Ion: 1.35 mmol/L (ref 1.15–1.40)
HCT: 43 % (ref 36.0–46.0)
Hemoglobin: 14.6 g/dL (ref 12.0–15.0)
O2 Saturation: 100 %
Potassium: 3.9 mmol/L (ref 3.5–5.1)
Sodium: 139 mmol/L (ref 135–145)
TCO2: 37 mmol/L — ABNORMAL HIGH (ref 22–32)
pCO2 arterial: 80.7 mmHg (ref 32.0–48.0)
pH, Arterial: 7.245 — ABNORMAL LOW (ref 7.350–7.450)
pO2, Arterial: 419 mmHg — ABNORMAL HIGH (ref 83.0–108.0)

## 2018-11-14 LAB — CBC WITH DIFFERENTIAL/PLATELET
Abs Immature Granulocytes: 0 10*3/uL (ref 0.00–0.07)
Basophils Absolute: 0 10*3/uL (ref 0.0–0.1)
Basophils Relative: 0 %
Eosinophils Absolute: 0.6 10*3/uL — ABNORMAL HIGH (ref 0.0–0.5)
Eosinophils Relative: 3 %
HCT: 48.1 % — ABNORMAL HIGH (ref 36.0–46.0)
Hemoglobin: 14.7 g/dL (ref 12.0–15.0)
Lymphocytes Relative: 27 %
Lymphs Abs: 5.6 10*3/uL — ABNORMAL HIGH (ref 0.7–4.0)
MCH: 29.6 pg (ref 26.0–34.0)
MCHC: 30.6 g/dL (ref 30.0–36.0)
MCV: 96.8 fL (ref 80.0–100.0)
Monocytes Absolute: 0.6 10*3/uL (ref 0.1–1.0)
Monocytes Relative: 3 %
Neutro Abs: 13.8 10*3/uL — ABNORMAL HIGH (ref 1.7–7.7)
Neutrophils Relative %: 67 %
Platelets: 298 10*3/uL (ref 150–400)
RBC: 4.97 MIL/uL (ref 3.87–5.11)
RDW: 14.7 % (ref 11.5–15.5)
WBC: 20.6 10*3/uL — ABNORMAL HIGH (ref 4.0–10.5)
nRBC: 0 % (ref 0.0–0.2)
nRBC: 0 /100 WBC

## 2018-11-14 LAB — COMPREHENSIVE METABOLIC PANEL
ALT: 47 U/L — ABNORMAL HIGH (ref 0–44)
AST: 78 U/L — ABNORMAL HIGH (ref 15–41)
Albumin: 3.5 g/dL (ref 3.5–5.0)
Alkaline Phosphatase: 87 U/L (ref 38–126)
Anion gap: 14 (ref 5–15)
BUN: 31 mg/dL — ABNORMAL HIGH (ref 8–23)
CO2: 26 mmol/L (ref 22–32)
Calcium: 10 mg/dL (ref 8.9–10.3)
Chloride: 100 mmol/L (ref 98–111)
Creatinine, Ser: 1.32 mg/dL — ABNORMAL HIGH (ref 0.44–1.00)
GFR calc Af Amer: 50 mL/min — ABNORMAL LOW (ref >60)
GFR calc non Af Amer: 43 mL/min — ABNORMAL LOW (ref >60)
Glucose, Bld: 341 mg/dL — ABNORMAL HIGH (ref 70–99)
Potassium: 4 mmol/L (ref 3.5–5.1)
Sodium: 140 mmol/L (ref 135–145)
Total Bilirubin: 0.4 mg/dL (ref 0.3–1.2)
Total Protein: 7.5 g/dL (ref 6.5–8.1)

## 2018-11-14 LAB — URINALYSIS, ROUTINE W REFLEX MICROSCOPIC
Bilirubin Urine: NEGATIVE
Glucose, UA: 150 mg/dL — AB
Hgb urine dipstick: NEGATIVE
Ketones, ur: NEGATIVE mg/dL
Leukocytes,Ua: NEGATIVE
Nitrite: NEGATIVE
Protein, ur: >=300 mg/dL — AB
Specific Gravity, Urine: 1.022 (ref 1.005–1.030)
pH: 5 (ref 5.0–8.0)

## 2018-11-14 LAB — TROPONIN I (HIGH SENSITIVITY)
Troponin I (High Sensitivity): 58 ng/L — ABNORMAL HIGH (ref <18)
Troponin I (High Sensitivity): 74 ng/L — ABNORMAL HIGH (ref <18)

## 2018-11-14 LAB — SARS CORONAVIRUS 2 BY RT PCR (HOSPITAL ORDER, PERFORMED IN ~~LOC~~ HOSPITAL LAB): SARS Coronavirus 2: NEGATIVE

## 2018-11-14 LAB — LACTIC ACID, PLASMA
Lactic Acid, Venous: 3.7 mmol/L (ref 0.5–1.9)
Lactic Acid, Venous: 2.5 mmol/L (ref 0.5–1.9)

## 2018-11-14 LAB — BRAIN NATRIURETIC PEPTIDE: B Natriuretic Peptide: 785.7 pg/mL — ABNORMAL HIGH (ref 0.0–100.0)

## 2018-11-14 MED ORDER — CHLORHEXIDINE GLUCONATE 0.12% ORAL RINSE (MEDLINE KIT)
15.0000 mL | Freq: Two times a day (BID) | OROMUCOSAL | Status: DC
  Administered 2018-11-15 – 2018-11-16 (×4): 15 mL via OROMUCOSAL

## 2018-11-14 MED ORDER — SODIUM CHLORIDE 0.9 % IV SOLN
2.0000 g | Freq: Once | INTRAVENOUS | Status: AC
  Administered 2018-11-14: 22:00:00 2 g via INTRAVENOUS
  Filled 2018-11-14: qty 2

## 2018-11-14 MED ORDER — PROPOFOL 10 MG/ML IV BOLUS
INTRAVENOUS | Status: AC | PRN
  Administered 2018-11-14: 20 mg via INTRAVENOUS

## 2018-11-14 MED ORDER — MIDAZOLAM HCL 2 MG/2ML IJ SOLN: 1.0000 mg | INTRAMUSCULAR | Status: DC | PRN

## 2018-11-14 MED ORDER — FENTANYL CITRATE (PF) 100 MCG/2ML IJ SOLN
50.0000 ug | INTRAMUSCULAR | Status: DC | PRN
  Administered 2018-11-15 – 2018-11-16 (×3): 50 ug via INTRAVENOUS
  Filled 2018-11-14 (×3): qty 2

## 2018-11-14 MED ORDER — DOCUSATE SODIUM 50 MG/5ML PO LIQD
100.0000 mg | Freq: Two times a day (BID) | ORAL | Status: DC | PRN
  Filled 2018-11-14: qty 10

## 2018-11-14 MED ORDER — SUCCINYLCHOLINE CHLORIDE 20 MG/ML IJ SOLN
INTRAMUSCULAR | Status: AC | PRN
  Administered 2018-11-14: 100 mg via INTRAVENOUS

## 2018-11-14 MED ORDER — IPRATROPIUM-ALBUTEROL 0.5-2.5 (3) MG/3ML IN SOLN
3.0000 mL | RESPIRATORY_TRACT | Status: DC
  Administered 2018-11-15 (×7): 3 mL via RESPIRATORY_TRACT
  Filled 2018-11-14 (×7): qty 3

## 2018-11-14 MED ORDER — NITROGLYCERIN IN D5W 200-5 MCG/ML-% IV SOLN
INTRAVENOUS | Status: AC
  Filled 2018-11-14: qty 250

## 2018-11-14 MED ORDER — BISACODYL 10 MG RE SUPP: 10.0000 mg | Freq: Every day | RECTAL | Status: DC | PRN

## 2018-11-14 MED ORDER — ETOMIDATE 2 MG/ML IV SOLN
INTRAVENOUS | Status: AC | PRN
  Administered 2018-11-14: 30 mg via INTRAVENOUS

## 2018-11-14 MED ORDER — ORAL CARE MOUTH RINSE
15.0000 mL | OROMUCOSAL | Status: DC
  Administered 2018-11-15 – 2018-11-16 (×13): 15 mL via OROMUCOSAL

## 2018-11-14 MED ORDER — LACTATED RINGERS IV BOLUS
1000.0000 mL | Freq: Once | INTRAVENOUS | Status: AC
  Administered 2018-11-14: 22:00:00 1000 mL via INTRAVENOUS

## 2018-11-14 MED ORDER — VANCOMYCIN HCL IN DEXTROSE 1-5 GM/200ML-% IV SOLN
1000.0000 mg | INTRAVENOUS | Status: DC
  Administered 2018-11-16: 1000 mg via INTRAVENOUS
  Filled 2018-11-14: qty 200

## 2018-11-14 MED ORDER — VANCOMYCIN HCL 10 G IV SOLR
2000.0000 mg | Freq: Once | INTRAVENOUS | Status: AC
  Administered 2018-11-14: 2000 mg via INTRAVENOUS
  Filled 2018-11-14: qty 2000

## 2018-11-14 MED ORDER — HEPARIN SODIUM (PORCINE) 5000 UNIT/ML IJ SOLN
5000.0000 [IU] | Freq: Three times a day (TID) | INTRAMUSCULAR | Status: DC
  Administered 2018-11-15 – 2018-11-18 (×10): 5000 [IU] via SUBCUTANEOUS
  Filled 2018-11-14 (×10): qty 1

## 2018-11-14 MED ORDER — SODIUM CHLORIDE 0.9 % IV SOLN
2.0000 g | Freq: Two times a day (BID) | INTRAVENOUS | Status: DC
  Administered 2018-11-15 (×2): 2 g via INTRAVENOUS
  Filled 2018-11-14 (×2): qty 2

## 2018-11-14 MED ORDER — FAMOTIDINE 40 MG/5ML PO SUSR
20.0000 mg | Freq: Two times a day (BID) | ORAL | Status: DC
  Administered 2018-11-15 – 2018-11-16 (×4): 20 mg
  Filled 2018-11-14 (×5): qty 2.5

## 2018-11-14 MED ORDER — PROPOFOL 1000 MG/100ML IV EMUL
INTRAVENOUS | Status: AC
  Administered 2018-11-14: 20:00:00 10 ug/kg/min via INTRAVENOUS
  Filled 2018-11-14: qty 100

## 2018-11-14 MED ORDER — FENTANYL CITRATE (PF) 100 MCG/2ML IJ SOLN
50.0000 ug | INTRAMUSCULAR | Status: DC | PRN
  Administered 2018-11-15 (×2): 50 ug via INTRAVENOUS
  Filled 2018-11-14 (×2): qty 2

## 2018-11-14 MED ORDER — PROPOFOL 1000 MG/100ML IV EMUL
5.0000 ug/kg/min | INTRAVENOUS | Status: DC
  Administered 2018-11-14: 20:00:00 10 ug/kg/min via INTRAVENOUS
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
## 2018-11-14 NOTE — H&P (Signed)
NAME:  Crystal Yates, MRN:  [id number], DOB:  [birth year], LOS: 0 ADMISSION DATE:  11/14/2018, CONSULTATION DATE:  11/14/2018 REFERRING MD:  Dr. Ronnald Nian, CHIEF COMPLAINT:  Resp failure  Brief History   37 yoF with hx of COPD and HF intubated for acute on chronic respiratory failure, SIRS, and acute on chronic heart failure.    History of present illness   HPI obtained from medical chart review as patient is currently intubated and sedated on mechanical ventilation.  Unable to reach spouse by telephone.   63 year old female with extensive PMH significant for COPD on 2L, OSA ongoing tobacco abuse, systolic/ diastolic HF, CAD, thyroid disease, adrenal insufficiency/ empty sella syndrome, Sjogrens syndrome, and IDDM presenting from home with worsening SOB and hypoxia.    Of note, patient evaluated in ER on 8/16 with progressive 2-3 day history of SOB, lower extremity swelling, and chronic chest pain after home diuretics were recently changed from zaroxolyn to lasix found to be overloaded with stable vitals and labs with good diuresis with 100mg  Lasix in ER.  Followed up with at [facility] 8/18 with minimal symptomatic improvement but stable weight with ongoing lasix BID and zaroxoyln.  Arrived on by EMS from home with work of breathing not improved with O2, steroids, and CPAP.  Found to be mildly hypertensive, rectal temp 100, tachypneic and tachycardic.  She required intubation for ongoing respiratory distress on arrival.  Labs noted for WBC 20.6, glucose 341, sCr 1.32, BNP 785, hs-trop 58, Lactic acid 3.7, mild AST/ ALT, UA negative, pan-cultured, EKG showing ST with rate in 150s with prolonged QTc.  CXR with bibasilar atelectasis without focal infiltrate with stable ETT/ OGT placement.  Post intubation ABG noted for 7.245/ 80.7/ 419/ 37.  Treated with 1L LR bolus, sedated on propofol, and empirically started on vancomycin and cefepime.  PCCM called for admit.   Past Medical History  She,  has a  past medical history of Acute on chronic systolic heart failure exacerbation(HCC) (04/08/2016), Arthritis, CAD in native artery, Chronic combined systolic and diastolic CHF (congestive heart failure) ([place]), CKD (chronic kidney disease), stage II, COPD (chronic obstructive pulmonary disease) ([place]), Diabetes mellitus without complication ([place]), Hashimoto's thyroiditis, Hyperlipidemia, Hypertension, On home oxygen therapy, Secondary adrenal insufficiency ([place]), Thyroid disease, and Tobacco abuse. OSA  Significant Hospital Events   8/19 Admitted  Consults:   Procedures:  8/19 ETT >>  Significant Diagnostic Tests:   Micro Data:  8/19 SARS CoV2 >> negative 8/19 BCx 2 >> 8/19 UC >> 8/19 trach asp >>  Antimicrobials:  8/19 vancomycin >> 8/19 cefepime >>  Interim history/subjective:  Currently sedated on propofol at 30 mcg/kg/min.  Objective   Blood pressure 111/70, pulse (!) 107, temperature 100.1 F (37.8 C), temperature source Rectal, resp. rate 20, height 5\' 3"  (1.6 m), weight 103.4 kg, SpO2 100 %.    Vent Mode: PRVC FiO2 (%):  [50 %-100 %] 50 % Set Rate:  [18 bmp-20 bmp] 20 bmp Vt Set:  [400 mL] 400 mL PEEP:  [5 cmH20] 5 cmH20  No intake or output data in the 24 hours ending 11/14/18 2256 Filed Weights   11/14/18 2024  Weight: 103.4 kg   Examination: General:  Older appearing female sedated on MV in NAD HEENT: MM pink/moist, ETT 7.5 at 24, OGT, pupils 4/reactive, anicteric  Neuro: sedated CV: rr, no mumur PULM:  Diffuse exp wheeze throughout, MV breaths GI: obese, soft, bs active, no foley  Extremities: warm/dry, +2LE  edema  Skin:  no rashes  Resolved Hospital Problem list    Assessment & Plan:   Acute on chronic hypercarbic/ hypoxic respiratory failure- multifactorial AECOPD Tobacco abuse P:  Full MV support, 8 cc/kg, rate 20 Check ABG in 1 since rate adjusted from 18-20 Intermittent CXR duonebs q 4 with prn albuterol  Solumedrol 40 mg q 12 VAP/ SUP PAD  protocol, d/c propofol and change to fentanyl, versed, consider precedex if QTc prolongation resolves Smoking cessation when appropriate    SIRS with leukocytosis - possibly reactive/ steroid induced given  - no clear infiltrate on CXR, UA ok  P: Trend WBC/ fever curve Follow pan-cultures  Check PCT Continue empiric vanc/ cefepime for now   Acute on chronic systolic/ diastolic HF Hx HTN, HLD, CAD s/p RCA stent - initially mildly hypertensive now soft on propofol  - last TTE 06/2018 with EF 40-50 %, limited study, global hypokinesis  P:  Tele monitoring  Hold on diuretics at this time given soft pressure  Goal MAP >65 Continue home  ASA, plavix, lipitor Trending hs-trop and repeat EKG now and in am TTE in am    Mild AKI NAGMA - s/p 1L NS P:  Insert foley  Check mag/ phos Trend BMP / urinary output/ daily weights  Replace electrolytes as indicated Avoid nephrotoxic agents, ensure adequate renal perfusion   IDDM P:  SSI mod Start lantus 20 units q hs,   Hypothyroidism AI/ empty sella syndrome- on home hydrocortisone  P:  Continue levothyroxine per tube Start stress dose steroids for now Hold home PO hydrocortisone   Best practice:  Diet: NPO Pain/Anxiety/Delirium protocol (if indicated): RASS goal 0/-1, fent/ versed VAP protocol (if indicated): yes DVT prophylaxis: heparin SQ/ SCDs GI prophylaxis: pepcid per tube Glucose control: SSI/ lantus Mobility: BR  Code Status: full  Family Communication: husband, Broadus John attempted to be reached, went to voicemail to call unit. Disposition: ICU  Labs   CBC: Recent Labs  Lab 11/11/18 0247 11/14/18 2023 11/14/18 2234  WBC 9.4 20.6*  --   NEUTROABS  --  13.8*  --   HGB 13.3 14.7 14.6  HCT 41.6 48.1* 43.0  MCV 92.2 96.8  --   PLT 225 298  --     Basic Metabolic Panel: Recent Labs  Lab 11/11/18 0247 11/14/18 2023 11/14/18 2234  NA 138 140 139  K 3.5 4.0 3.9  CL 99 100  --   CO2 30 26  --   GLUCOSE  200* 341*  --   BUN 16 31*  --   CREATININE 1.14* 1.32*  --   CALCIUM 10.2 10.0  --    GFR: Estimated Creatinine Clearance: 50.1 mL/min (A) (by C-G formula based on SCr of 1.32 mg/dL (H)). Recent Labs  Lab 11/11/18 0247 11/14/18 2023 11/14/18 2024  WBC 9.4 20.6*  --   LATICACIDVEN  --   --  3.7*    Liver Function Tests: Recent Labs  Lab 11/14/18 2023  AST 78*  ALT 47*  ALKPHOS 87  BILITOT 0.4  PROT 7.5  ALBUMIN 3.5   No results for input(s): LIPASE, AMYLASE in the last 168 hours. No results for input(s): AMMONIA in the last 168 hours.  ABG    Component Value Date/Time   PHART 7.245 (L) 11/14/2018 2234   PCO2ART 80.7 (HH) 11/14/2018 2234   PO2ART 419.0 (H) 11/14/2018 2234   HCO3 35.0 (H) 11/14/2018 2234   TCO2 37 (H) 11/14/2018 2234   ACIDBASEDEF 2.7 (H) 07/20/2018 0005  O2SAT 100.0 11/14/2018 2234     Coagulation Profile: No results for input(s): INR, PROTIME in the last 168 hours.  Cardiac Enzymes: No results for input(s): CKTOTAL, CKMB, CKMBINDEX, TROPONINI in the last 168 hours.  HbA1C: Hgb A1c MFr Bld  Date/Time Value Ref Range Status  08/07/2018 05:31 AM 8.6 (H) 4.8 - 5.6 % Final    Comment:    (NOTE) Pre diabetes:          5.7%-6.4% Diabetes:              >6.4% Glycemic control for   <7.0% adults with diabetes   09/16/2017 10:13 PM 10.5 (H) 4.8 - 5.6 % Final    Comment:    (NOTE) Pre diabetes:          5.7%-6.4% Diabetes:              >6.4% Glycemic control for   <7.0% adults with diabetes     CBG: No results for input(s): GLUCAP in the last 168 hours.  Review of Systems:   Unable  Past Medical History  She,  has a past medical history of Acute on chronic systolic heart failure exacerbation(HCC) (04/08/2016), Arthritis, CAD in native artery, Chronic combined systolic and diastolic CHF (congestive heart failure) ([place]), CKD (chronic kidney disease), stage II, COPD (chronic obstructive pulmonary disease) ([place]), Diabetes mellitus without  complication ([place]), Hashimoto's thyroiditis, Hyperlipidemia, Hypertension, On home oxygen therapy, Secondary adrenal insufficiency ([place]), Thyroid disease, and Tobacco abuse.   Surgical History    Past Surgical History:  Procedure Laterality Date  . ABDOMINAL SURGERY    . CESAREAN SECTION    . CHOLECYSTECTOMY    . COLON RESECTION    . COLONOSCOPY WITH PROPOFOL N/A 01/16/2018   Procedure: COLONOSCOPY WITH PROPOFOL;  Surgeon: Rush Landmark Telford Nab., MD;  Location: [facility];  Service: Gastroenterology;  Laterality: N/A;  . CORONARY BALLOON ANGIOPLASTY N/A 10/13/2017   Procedure: CORONARY BALLOON ANGIOPLASTY;  Surgeon: Belva Crome, MD;  Location: [facility];  Service: Cardiovascular;  Laterality: N/A;  . HERNIA MESH REMOVAL    . HERNIA REPAIR    . LEFT HEART CATH AND CORONARY ANGIOGRAPHY N/A 10/13/2016   Procedure: Left Heart Cath and Coronary Angiography;  Surgeon: Nelva Bush, MD;  Location: [place] [facility];  Service: Cardiovascular;  Laterality: N/A;  . POLYPECTOMY  01/16/2018   Procedure: POLYPECTOMY;  Surgeon: Rush Landmark Telford Nab., MD;  Location: [facility];  Service: Gastroenterology;;  . RIGHT/LEFT HEART CATH AND CORONARY ANGIOGRAPHY N/A 10/13/2017   Procedure: RIGHT/LEFT HEART CATH AND CORONARY ANGIOGRAPHY;  Surgeon: Belva Crome, MD;  Location: [facility];  Service: Cardiovascular;  Laterality: N/A;  . SHOULDER ARTHROSCOPY    . TUBAL LIGATION       Social History   reports that she has been smoking cigarettes. She has a 44.00 pack-year smoking history. She has never used smokeless tobacco. She reports current alcohol use of about 7.0 standard drinks of alcohol per week. She reports previous drug use. Drug: Marijuana.   Family History   Her family history includes Diabetes in her father, sister, sister, and son; Stroke in her mother.   Allergies Allergies  Allergen Reactions  . Hydroxychloroquine Shortness Of Breath, Nausea Only and Other (See  Comments)    Dizziness  . Donepezil Other (See Comments)    Dizziness, depression, and makes the patient feel "funny"  . Prednisone Other (See Comments)    Causes depression and suicidal thoughts  . Anticoagulant Cit  Dext Alvina Chou Formula A] Other (See Comments)    Unknown  . Bupropion Other (See Comments)    Suicidal thoughts  . Metrizamide Other (See Comments)    (a non-ionic radiopaque contrast agent) "Blows the vein" and contrast gathers at the injected site's limb  . Varenicline Other (See Comments)    Suicidal thoughts  . Tape Rash and Other (See Comments)    Paper tape is preferred, PLEASE     Home Medications  Prior to Admission medications   Medication Sig Start Date End Date Taking? Authorizing Provider  acetaminophen (TYLENOL) 500 MG tablet Take 1 tablet (500 mg total) by mouth every 6 (six) hours as needed for mild pain. 07/11/18   Mariel Aloe, MD  albuterol (PROAIR HFA) 108 (90 Base) MCG/ACT inhaler Inhale 2 puffs into the lungs every 4-6 hours as needed for shortness of breath or wheezing 01/31/18   Tanda Rockers, MD  albuterol (PROVENTIL) (2.5 MG/3ML) 0.083% nebulizer solution Take 3 mLs (2.5 mg total) by nebulization every 6 (six) hours as needed for wheezing or shortness of breath. 02/28/18   Tanda Rockers, MD  aspirin EC 81 MG EC tablet Take 1 tablet (81 mg total) by mouth daily. 08/10/18   Kayleen Memos, DO  atorvastatin (LIPITOR) 80 MG tablet Take 80 mg by mouth every evening.  06/18/18   [provider]  CALCIUM POLYCARBOPHIL PO Take 3 tablets by mouth daily.    [provider]  Cholecalciferol (VITAMIN D3) 2000 units capsule Take 2,000 Units by mouth daily.     [provider]  clopidogrel (PLAVIX) 75 MG tablet Take 1 tablet (75 mg total) by mouth daily. Patient taking differently: Take 75 mg by mouth at bedtime.  01/20/18   Masoudi, Dorthula Rue, MD  doxycycline (VIBRA-TABS) 100 MG tablet Take 1 tablet (100 mg total) by mouth 2 (two)  times daily. 10/25/18   Fenton Foy, NP  DULoxetine (CYMBALTA) 60 MG capsule Take 120 mg by mouth daily.  12/05/16   [provider]  hydrocortisone (CORTEF) 5 MG tablet Take 5-15 mg by mouth See admin instructions. Take 3 tablets in the morning and take 1 tablet in the evening    [provider]  insulin degludec (TRESIBA FLEXTOUCH) 100 UNIT/ML SOPN FlexTouch Pen Inject 42 Units into the skin at bedtime.  08/28/16   [provider]  Iron-FA-B Cmp-C-Biot-Probiotic (FUSION PLUS) CAPS Take 1 capsule by mouth at bedtime.  10/09/16   [provider]  levothyroxine (SYNTHROID, LEVOTHROID) 150 MCG tablet Take 150 mcg by mouth daily before breakfast.    [provider]  MAGNESIUM-OXIDE 400 (241.3 Mg) MG tablet Take 400 mg by mouth daily. 06/18/18   [provider]  metolazone (ZAROXOLYN) 2.5 MG tablet Take 1 tablet (2.5 mg total) by mouth daily. 11/09/18 02/07/19  Revankar, Reita Cliche, MD  nitroGLYCERIN (NITROSTAT) 0.4 MG SL tablet Place 1 tablet (0.4 mg total) under the tongue every 5 (five) minutes as needed for chest pain. 07/19/18   Revankar, Reita Cliche, MD  OXYGEN Place 2 L into the nose continuous.     [provider]  polyethylene glycol (MIRALAX / GLYCOLAX) packet Take 17 g by mouth daily. Patient taking differently: Take 17 g by mouth daily as needed for mild constipation.  12/27/17   Masoudi, Elhamalsadat, MD  potassium chloride SA (K-DUR) 20 MEQ tablet Take 2 tablets (40 mEq total) by mouth daily. Take when you take Torsemide. 10/04/18   Park Liter,  MD  pramipexole (MIRAPEX) 1 MG tablet Take 1 mg by mouth 2 (two) times a day.    [provider]  Tiotropium Bromide-Olodaterol (STIOLTO RESPIMAT) 2.5-2.5 MCG/ACT AERS Inhale 2 puffs into the lungs daily. 01/31/18   Tanda Rockers, MD  TRULICITY 1.5 IS/3.0XE SOPN Inject 1.5 mg into the skin every Sunday.  10/10/16   [provider]  zaleplon (SONATA) 10 MG capsule Take 10 mg  by mouth at bedtime as needed for sleep.    [provider]     Critical care time: 108 mins    Kennieth Rad, MSN, AGACNP-BC [facility] Pgr: [phone] or if no answer [phone] 11/15/2018, 12:06 AM

## 2018-11-14 NOTE — ED Notes (Signed)
MD [place] intubation w/ 7.5 ETT, 23 @lip , +color change, equal blt breath sounds. Bagging w/ good compliance.

## 2018-11-14 NOTE — Progress Notes (Signed)
Pharmacy Antibiotic Note  Crystal Yates is a 63 y.o. female admitted on 11/14/2018 with respiratory distress. Pharmacy has been consulted for vancomycin and cefepime dosing. Tmax is 100.1 and WBC is elevated at 20.6. Scr is slightly above patients baseline at 1.32. Lactic acid is 3.7.   Plan: Vancomycin 2gm IV x 1 then 1gm IV Q24H Cefepime 2gm IV Q12H F/u renal fxn, C&S, clinical status and peak/trough at SS  Height: 5\' 3"  (160 cm) Weight: 227 lb 15.3 oz (103.4 kg) IBW/kg (Calculated) : 52.4  Temp (24hrs), Avg:100.1 F (37.8 C), Min:100.1 F (37.8 C), Max:100.1 F (37.8 C)  Recent Labs  Lab 11/11/18 0247 11/14/18 2023 11/14/18 2024  WBC 9.4 20.6*  --   CREATININE 1.14* 1.32*  --   LATICACIDVEN  --   --  3.7*    Estimated Creatinine Clearance: 50.1 mL/min (A) (by C-G formula based on SCr of 1.32 mg/dL (H)).    Allergies  Allergen Reactions  . Hydroxychloroquine Shortness Of Breath, Nausea Only and Other (See Comments)    Dizziness  . Donepezil Other (See Comments)    Dizziness, depression, and makes the patient feel "funny"  . Prednisone Other (See Comments)    Causes depression and suicidal thoughts  . Anticoagulant Cit Dext [Acd Formula A] Other (See Comments)    Unknown  . Bupropion Other (See Comments)    Suicidal thoughts  . Metrizamide Other (See Comments)    (a non-ionic radiopaque contrast agent) "Blows the vein" and contrast gathers at the injected site's limb  . Varenicline Other (See Comments)    Suicidal thoughts  . Tape Rash and Other (See Comments)    Paper tape is preferred, PLEASE    Antimicrobials this admission: Vanc 8/19>> Cefepime 8/19>>  Dose adjustments this admission: N/A  Microbiology results: Pending  Thank you for allowing pharmacy to be a part of this patient's care.  Emillee Talsma, Rande Lawman 11/14/2018 9:53 PM

## 2018-11-14 NOTE — ED Triage Notes (Signed)
Pt BIB [facility] on CPAP for eval of severe respiratory distress. EMS reports near silent chest on their arrival Rec'd 2g mag, 125 solumedrol, 2 duonebs en route w/ minimal improvement. Pt arrives tachy to 140s, RR of 45, sats of 81 on CPAP. Decision made to intubate on arrival for work of breathing and desats. Hx of same.

## 2018-11-15 ENCOUNTER — Inpatient Hospital Stay (HOSPITAL_COMMUNITY): Payer: Federal, State, Local not specified - PPO

## 2018-11-15 DIAGNOSIS — G9340 Encephalopathy, unspecified: Secondary | ICD-10-CM

## 2018-11-15 DIAGNOSIS — J9621 Acute and chronic respiratory failure with hypoxia: Secondary | ICD-10-CM | POA: Diagnosis not present

## 2018-11-15 DIAGNOSIS — E872 Acidosis, unspecified: Secondary | ICD-10-CM

## 2018-11-15 DIAGNOSIS — J9601 Acute respiratory failure with hypoxia: Secondary | ICD-10-CM | POA: Diagnosis not present

## 2018-11-15 DIAGNOSIS — J449 Chronic obstructive pulmonary disease, unspecified: Secondary | ICD-10-CM | POA: Diagnosis not present

## 2018-11-15 LAB — POCT I-STAT 7, (LYTES, BLD GAS, ICA,H+H)
Acid-Base Excess: 3 mmol/L — ABNORMAL HIGH (ref 0.0–2.0)
Bicarbonate: 28.9 mmol/L — ABNORMAL HIGH (ref 20.0–28.0)
Calcium, Ion: 1.31 mmol/L (ref 1.15–1.40)
HCT: 38 % (ref 36.0–46.0)
Hemoglobin: 12.9 g/dL (ref 12.0–15.0)
O2 Saturation: 98 %
Potassium: 4 mmol/L (ref 3.5–5.1)
Sodium: 141 mmol/L (ref 135–145)
TCO2: 30 mmol/L (ref 22–32)
pCO2 arterial: 48.6 mmHg — ABNORMAL HIGH (ref 32.0–48.0)
pH, Arterial: 7.382 (ref 7.350–7.450)
pO2, Arterial: 109 mmHg — ABNORMAL HIGH (ref 83.0–108.0)

## 2018-11-15 LAB — TRIGLYCERIDES
Triglycerides: 121 mg/dL (ref <150)
Triglycerides: 82 mg/dL (ref <150)

## 2018-11-15 LAB — CBC
HCT: 42.2 % (ref 36.0–46.0)
Hemoglobin: 13.3 g/dL (ref 12.0–15.0)
MCH: 29.6 pg (ref 26.0–34.0)
MCHC: 31.5 g/dL (ref 30.0–36.0)
MCV: 93.8 fL (ref 80.0–100.0)
Platelets: 232 10*3/uL (ref 150–400)
RBC: 4.5 MIL/uL (ref 3.87–5.11)
RDW: 14.6 % (ref 11.5–15.5)
WBC: 19.6 10*3/uL — ABNORMAL HIGH (ref 4.0–10.5)
nRBC: 0 % (ref 0.0–0.2)

## 2018-11-15 LAB — PHOSPHORUS
Phosphorus: 4.1 mg/dL (ref 2.5–4.6)
Phosphorus: 3.1 mg/dL (ref 2.5–4.6)
Phosphorus: 3.3 mg/dL (ref 2.5–4.6)

## 2018-11-15 LAB — BASIC METABOLIC PANEL
Anion gap: 10 (ref 5–15)
BUN: 34 mg/dL — ABNORMAL HIGH (ref 8–23)
CO2: 29 mmol/L (ref 22–32)
Calcium: 9.4 mg/dL (ref 8.9–10.3)
Chloride: 102 mmol/L (ref 98–111)
Creatinine, Ser: 1.42 mg/dL — ABNORMAL HIGH (ref 0.44–1.00)
GFR calc Af Amer: 45 mL/min — ABNORMAL LOW (ref >60)
GFR calc non Af Amer: 39 mL/min — ABNORMAL LOW (ref >60)
Glucose, Bld: 320 mg/dL — ABNORMAL HIGH (ref 70–99)
Potassium: 4.3 mmol/L (ref 3.5–5.1)
Sodium: 141 mmol/L (ref 135–145)

## 2018-11-15 LAB — GLUCOSE, CAPILLARY
Glucose-Capillary: 112 mg/dL — ABNORMAL HIGH (ref 70–99)
Glucose-Capillary: 126 mg/dL — ABNORMAL HIGH (ref 70–99)
Glucose-Capillary: 145 mg/dL — ABNORMAL HIGH (ref 70–99)
Glucose-Capillary: 167 mg/dL — ABNORMAL HIGH (ref 70–99)
Glucose-Capillary: 217 mg/dL — ABNORMAL HIGH (ref 70–99)
Glucose-Capillary: 233 mg/dL — ABNORMAL HIGH (ref 70–99)
Glucose-Capillary: 303 mg/dL — ABNORMAL HIGH (ref 70–99)
Glucose-Capillary: 321 mg/dL — ABNORMAL HIGH (ref 70–99)

## 2018-11-15 LAB — HEPATIC FUNCTION PANEL
ALT: 73 U/L — ABNORMAL HIGH (ref 0–44)
AST: 103 U/L — ABNORMAL HIGH (ref 15–41)
Albumin: 3 g/dL — ABNORMAL LOW (ref 3.5–5.0)
Alkaline Phosphatase: 82 U/L (ref 38–126)
Bilirubin, Direct: 0.2 mg/dL (ref 0.0–0.2)
Indirect Bilirubin: 0.9 mg/dL (ref 0.3–0.9)
Total Bilirubin: 1.1 mg/dL (ref 0.3–1.2)
Total Protein: 6.3 g/dL — ABNORMAL LOW (ref 6.5–8.1)

## 2018-11-15 LAB — MRSA PCR SCREENING: MRSA by PCR: NEGATIVE

## 2018-11-15 LAB — PROCALCITONIN: Procalcitonin: <0.1 ng/mL

## 2018-11-15 LAB — HEMOGLOBIN A1C
Hgb A1c MFr Bld: 9.3 % — ABNORMAL HIGH (ref 4.8–5.6)
Mean Plasma Glucose: 220.21 mg/dL

## 2018-11-15 LAB — ECHOCARDIOGRAM COMPLETE
Height: 63 in
Weight: 3686.09 oz

## 2018-11-15 LAB — PROTIME-INR
INR: 0.9 (ref 0.8–1.2)
Prothrombin Time: 12.4 seconds (ref 11.4–15.2)

## 2018-11-15 LAB — TROPONIN I (HIGH SENSITIVITY): Troponin I (High Sensitivity): 109 ng/L (ref <18)

## 2018-11-15 LAB — MAGNESIUM
Magnesium: 2 mg/dL (ref 1.7–2.4)
Magnesium: 1.9 mg/dL (ref 1.7–2.4)
Magnesium: 1.9 mg/dL (ref 1.7–2.4)

## 2018-11-15 MED ORDER — INSULIN ASPART 100 UNIT/ML ~~LOC~~ SOLN
0.0000 [IU] | SUBCUTANEOUS | Status: DC
  Administered 2018-11-15: 17:00:00 4 [IU] via SUBCUTANEOUS
  Administered 2018-11-15 (×2): 15 [IU] via SUBCUTANEOUS
  Administered 2018-11-15: 10:00:00 7 [IU] via SUBCUTANEOUS
  Administered 2018-11-15: 3 [IU] via SUBCUTANEOUS
  Administered 2018-11-16: 15 [IU] via SUBCUTANEOUS
  Administered 2018-11-16: 7 [IU] via SUBCUTANEOUS
  Administered 2018-11-16: 15 [IU] via SUBCUTANEOUS
  Administered 2018-11-16: 11 [IU] via SUBCUTANEOUS

## 2018-11-15 MED ORDER — HYDROCORTISONE NA SUCCINATE PF 100 MG IJ SOLR
50.0000 mg | Freq: Four times a day (QID) | INTRAMUSCULAR | Status: DC
  Administered 2018-11-15 – 2018-11-16 (×6): 50 mg via INTRAVENOUS
  Filled 2018-11-15 (×6): qty 2

## 2018-11-15 MED ORDER — CLOPIDOGREL BISULFATE 75 MG PO TABS: 75.0000 mg | ORAL_TABLET | Freq: Every day | ORAL | Status: DC

## 2018-11-15 MED ORDER — PROPOFOL 1000 MG/100ML IV EMUL
5.0000 ug/kg/min | INTRAVENOUS | Status: DC
  Administered 2018-11-15: 01:00:00 30 ug/kg/min via INTRAVENOUS
  Administered 2018-11-15: 35 ug/kg/min via INTRAVENOUS
  Filled 2018-11-15: qty 100

## 2018-11-15 MED ORDER — ATORVASTATIN CALCIUM 80 MG PO TABS
80.0000 mg | ORAL_TABLET | Freq: Every evening | ORAL | Status: DC
  Administered 2018-11-16 – 2018-11-17 (×2): 80 mg via ORAL
  Filled 2018-11-15 (×2): qty 1

## 2018-11-15 MED ORDER — CHLORHEXIDINE GLUCONATE CLOTH 2 % EX PADS
6.0000 | MEDICATED_PAD | Freq: Every day | CUTANEOUS | Status: DC
  Administered 2018-11-15 – 2018-11-16 (×2): 6 via TOPICAL

## 2018-11-15 MED ORDER — PROPOFOL 1000 MG/100ML IV EMUL
5.0000 ug/kg/min | INTRAVENOUS | Status: DC
  Administered 2018-11-15: 12:00:00 25 ug/kg/min via INTRAVENOUS
  Administered 2018-11-16: 5 ug/kg/min via INTRAVENOUS
  Filled 2018-11-15 (×2): qty 100

## 2018-11-15 MED ORDER — DULOXETINE HCL 60 MG PO CPEP
120.0000 mg | ORAL_CAPSULE | Freq: Every day | ORAL | Status: DC
  Administered 2018-11-16 – 2018-11-18 (×3): 120 mg via ORAL
  Filled 2018-11-15 (×3): qty 2

## 2018-11-15 MED ORDER — PROPOFOL 1000 MG/100ML IV EMUL
INTRAVENOUS | Status: AC
  Administered 2018-11-15: 01:00:00 30 ug/kg/min via INTRAVENOUS
  Filled 2018-11-15: qty 100

## 2018-11-15 MED ORDER — INSULIN GLARGINE 100 UNIT/ML ~~LOC~~ SOLN
20.0000 [IU] | Freq: Every day | SUBCUTANEOUS | Status: DC
  Administered 2018-11-15 – 2018-11-17 (×4): 20 [IU] via SUBCUTANEOUS
  Filled 2018-11-15 (×6): qty 0.2

## 2018-11-15 MED ORDER — VITAL HIGH PROTEIN PO LIQD
1000.0000 mL | ORAL | Status: DC
  Administered 2018-11-15: 1000 mL

## 2018-11-15 MED ORDER — ALBUTEROL SULFATE (2.5 MG/3ML) 0.083% IN NEBU: 2.5000 mg | INHALATION_SOLUTION | RESPIRATORY_TRACT | Status: DC | PRN

## 2018-11-15 MED ORDER — LEVOTHYROXINE SODIUM 75 MCG PO TABS
150.0000 ug | ORAL_TABLET | Freq: Every day | ORAL | Status: DC
  Administered 2018-11-16: 150 ug
  Filled 2018-11-15: qty 2

## 2018-11-15 MED ORDER — PRO-STAT SUGAR FREE PO LIQD
30.0000 mL | Freq: Two times a day (BID) | ORAL | Status: DC
  Administered 2018-11-15 (×2): 30 mL
  Filled 2018-11-15 (×2): qty 30

## 2018-11-15 MED ORDER — CLOPIDOGREL BISULFATE 75 MG PO TABS
75.0000 mg | ORAL_TABLET | Freq: Every day | ORAL | Status: DC
  Administered 2018-11-15 – 2018-11-17 (×4): 75 mg via ORAL
  Filled 2018-11-15 (×4): qty 1

## 2018-11-15 MED ORDER — SODIUM CHLORIDE 0.9 % IV SOLN
INTRAVENOUS | Status: DC | PRN
  Administered 2018-11-15: 1000 mL via INTRAVENOUS

## 2018-11-15 MED ORDER — ASPIRIN 81 MG PO CHEW
81.0000 mg | CHEWABLE_TABLET | Freq: Every day | ORAL | Status: DC
  Administered 2018-11-16: 81 mg
  Filled 2018-11-15 (×2): qty 1

## 2018-11-15 NOTE — Progress Notes (Signed)
Inpatient Diabetes Program Recommendations  AACE/ADA: New Consensus Statement on Inpatient Glycemic Control (2015)  Target Ranges:  Prepandial:   less than 140 mg/dL      Peak postprandial:   less than 180 mg/dL (1-2 hours)      Critically ill patients:  140 - 180 mg/dL   Lab Results  Component Value Date   GLUCAP 217 (H) 11/15/2018   HGBA1C 9.3 (H) 11/15/2018    Review of Glycemic Control  Diabetes history: DM 2 Outpatient Diabetes medications: Tresiba 42 units, Trulicity 1.5 QSunday Current orders for Inpatient glycemic control:  Lantus 20 units Novolog 0-20 units Q4  A1c 9.3 % this admission  Inpatient Diabetes Program Recommendations:    Patient Vented  Consider ICU Glycemic Control order set phase 1 SQ insulin Novolog Resistant scale 3-9 units Q4 hours.  Fasting 217 this am. Consider increasing Lantus to 25 units.  Hanks,  Tama Headings RN, MSN, BC-ADM Inpatient Diabetes Coordinator Team Pager [phone] (8a-5p)

## 2018-11-15 NOTE — Progress Notes (Addendum)
NAME:  Crystal Yates, MRN:  [id number], DOB:  February 28, 1956, LOS: 1 ADMISSION DATE:  11/14/2018, CONSULTATION DATE:  11/14/2018 REFERRING MD:  Dr. Ronnald Nian, CHIEF COMPLAINT:  Resp failure  Brief History   40 yoF with hx of COPD and HF intubated for acute on chronic respiratory failure, SIRS, and acute on chronic heart failure.    History of present illness   HPI obtained from medical chart review as patient is currently intubated and sedated on mechanical ventilation.  Unable to reach spouse by telephone.   63 year old female with extensive PMH significant for COPD on 2L, OSA ongoing tobacco abuse, systolic/ diastolic HF, CAD, thyroid disease, adrenal insufficiency/ empty sella syndrome, Sjogrens syndrome, and IDDM presenting from home with worsening SOB and hypoxia.    Of note, patient evaluated in ER on 8/16 with progressive 2-3 day history of SOB, lower extremity swelling, and chronic chest pain after home diuretics were recently changed from zaroxolyn to lasix found to be overloaded with stable vitals and labs with good diuresis with 100mg  Lasix in ER.  Followed up with at [facility] 8/18 with minimal symptomatic improvement but stable weight with ongoing lasix BID and zaroxoyln.  Arrived on by EMS from home with work of breathing not improved with O2, steroids, and CPAP.  Found to be mildly hypertensive, rectal temp 100, tachypneic and tachycardic.  She required intubation for ongoing respiratory distress on arrival.  Labs noted for WBC 20.6, glucose 341, sCr 1.32, BNP 785, hs-trop 58, Lactic acid 3.7, mild AST/ ALT, UA negative, pan-cultured, EKG showing ST with rate in 150s with prolonged QTc.  CXR with bibasilar atelectasis without focal infiltrate with stable ETT/ OGT placement.  Post intubation ABG noted for 7.245/ 80.7/ 419/ 37.  Treated with 1L LR bolus, sedated on propofol, and empirically started on vancomycin and cefepime.  PCCM called for admit.   Past Medical History  She,  has a  past medical history of Acute on chronic systolic heart failure exacerbation(HCC) (04/08/2016), Arthritis, CAD in native artery, Chronic combined systolic and diastolic CHF (congestive heart failure) ([place]), CKD (chronic kidney disease), stage II, COPD (chronic obstructive pulmonary disease) ([place]), Diabetes mellitus without complication ([place]), Hashimoto's thyroiditis, Hyperlipidemia, Hypertension, On home oxygen therapy, Secondary adrenal insufficiency ([place]), Thyroid disease, and Tobacco abuse. OSA  Significant Hospital Events   8/19 Admitted  Consults:   Procedures:  8/19 ETT >>  Significant Diagnostic Tests:   Micro Data:  8/19 SARS CoV2 >> negative 8/19 BCx 2 >> 8/19 UC >> 8/19 trach asp >>  Antimicrobials:  8/19 vancomycin >> 8/19 cefepime >>  Interim history/subjective:  She is either sedated or agitated complaining of pain  Objective   Blood pressure 108/61, pulse 88, temperature 98.2 F (36.8 C), temperature source Oral, resp. rate 20, height 5\' 3"  (1.6 m), weight 104.5 kg, SpO2 99 %.    Vent Mode: PSV;CPAP FiO2 (%):  [40 %-100 %] 40 % Set Rate:  [2 bmp-20 bmp] 20 bmp Vt Set:  [400 mL] 400 mL PEEP:  [5 cmH20] 5 cmH20 Pressure Support:  [10 cmH20] 10 cmH20 Plateau Pressure:  [14 cmH20-22 cmH20] 14 cmH20   Intake/Output Summary (Last 24 hours) at 11/15/2018 0959 Last data filed at 11/15/2018 0942 Gross per 24 hour  Intake 390.08 ml  Output 250 ml  Net 140.08 ml   Filed Weights   11/14/18 2024 11/15/18 0030 11/15/18 0500  Weight: 103.4 kg 104.5 kg 104.5 kg   Examination: General: Obese female currently heavily sedated  on full mechanical ventilatory support HEENT: Endotracheal tube is in place Neuro:  CV: Heart sounds regular regular rate and rhythm PULM: Mild expiratory wheeze GI: soft, bsx4 active, obese Extremities: warm/dry, 1+ edema  Skin: no rashes or lesions   Resolved Hospital Problem list    Assessment & Plan:   Acute on chronic hypercarbic/  hypoxic respiratory failure- multifactorial AECOPD Tobacco abuse P:  Vent bundle Arterial blood gases consistent with chronic hypercarbia I serial chest x-ray Bronchodilators Smoking cessation  SIRS with leukocytosis - possibly reactive/ steroid induced given  - no clear infiltrate on CXR, UA ok  P: Monitor WBC and fever curve Calcitonin was less than 0.10 Low threshold to  stop vancomycin and cefepime   Acute on chronic systolic/ diastolic HF Hx HTN, HLD, CAD s/p RCA stentl  - last TTE 06/2018 with EF 40-50 %, limited study, global hypokinesis  P:   ICU monitoring No diuresis at this time Goal mean arterial pressure greater than 65 Continue home  ASA, plavix, lipitor 2D echo pending   Mild AKI NAGMA Lab Results  Component Value Date   CREATININE 1.42 (H) 11/15/2018   CREATININE 1.32 (H) 11/14/2018   CREATININE 1.14 (H) 11/11/2018   Recent Labs  Lab 11/14/18 2234 11/15/18 0036 11/15/18 0819  NA 139 141 141   Recent Labs  Lab 11/14/18 2234 11/15/18 0036 11/15/18 0819  K 3.9 4.3 4.0    P:  Monitor renal function Monitor replete electrolytes as needed   IDDM P:  Sliding scale insulin Lantus 20 units nightly  Hypothyroidism AI/ empty sella syndrome- on home hydrocortisone  P:  Continue Synthroid Stress dose steroids    Best practice:  Diet: NPO Pain/Anxiety/Delirium protocol (if indicated): RASS goal 0/-1, fent/ versed VAP protocol (if indicated): yes DVT prophylaxis: heparin SQ/ SCDs GI prophylaxis: pepcid per tube Glucose control: SSI/ lantus Mobility: BR  Code Status: full  Family Communication: husband, Broadus John attempted to be reached, went to voicemail to call unit. Disposition: ICU  Labs   CBC: Recent Labs  Lab 11/11/18 0247 11/14/18 2023 11/14/18 2234 11/15/18 0036 11/15/18 0819  WBC 9.4 20.6*  --  19.6*  --   NEUTROABS  --  13.8*  --   --   --   HGB 13.3 14.7 14.6 13.3 12.9  HCT 41.6 48.1* 43.0 42.2 38.0  MCV 92.2 96.8   --  93.8  --   PLT 225 298  --  232  --     Basic Metabolic Panel: Recent Labs  Lab 11/11/18 0247 11/14/18 2023 11/14/18 2234 11/15/18 0036 11/15/18 0819  NA 138 140 139 141 141  K 3.5 4.0 3.9 4.3 4.0  CL 99 100  --  102  --   CO2 30 26  --  29  --   GLUCOSE 200* 341*  --  320*  --   BUN 16 31*  --  34*  --   CREATININE 1.14* 1.32*  --  1.42*  --   CALCIUM 10.2 10.0  --  9.4  --   MG  --   --   --  2.0  --   PHOS  --   --   --  4.1  --    GFR: Estimated Creatinine Clearance: 46.9 mL/min (A) (by C-G formula based on SCr of 1.42 mg/dL (H)). Recent Labs  Lab 11/11/18 0247 11/14/18 2023 11/14/18 2024 11/14/18 2253 11/15/18 0036  PROCALCITON  --   --   --   --  <  0.10  WBC 9.4 20.6*  --   --  19.6*  LATICACIDVEN  --   --  3.7* 2.5*  --     Liver Function Tests: Recent Labs  Lab 11/14/18 2023 11/15/18 0036  AST 78* 103*  ALT 47* 73*  ALKPHOS 87 82  BILITOT 0.4 1.1  PROT 7.5 6.3*  ALBUMIN 3.5 3.0*   No results for input(s): LIPASE, AMYLASE in the last 168 hours. No results for input(s): AMMONIA in the last 168 hours.  ABG    Component Value Date/Time   PHART 7.382 11/15/2018 0819   PCO2ART 48.6 (H) 11/15/2018 0819   PO2ART 109.0 (H) 11/15/2018 0819   HCO3 28.9 (H) 11/15/2018 0819   TCO2 30 11/15/2018 0819   ACIDBASEDEF 2.7 (H) 07/20/2018 0005   O2SAT 98.0 11/15/2018 0819     Coagulation Profile: Recent Labs  Lab 11/15/18 0036  INR 0.9    Cardiac Enzymes: No results for input(s): CKTOTAL, CKMB, CKMBINDEX, TROPONINI in the last 168 hours.  HbA1C: Hgb A1c MFr Bld  Date/Time Value Ref Range Status  11/15/2018 12:36 AM 9.3 (H) 4.8 - 5.6 % Final    Comment:    (NOTE) Pre diabetes:          5.7%-6.4% Diabetes:              >6.4% Glycemic control for   <7.0% adults with diabetes   08/07/2018 05:31 AM 8.6 (H) 4.8 - 5.6 % Final    Comment:    (NOTE) Pre diabetes:          5.7%-6.4% Diabetes:              >6.4% Glycemic control for   <7.0%  adults with diabetes     CBG: Recent Labs  Lab 11/15/18 0026 11/15/18 0419 11/15/18 0750  GLUCAP 321* 303* 217*      Critical care time: 30 mins    Steve Minor ACNP Crystal Yates PCCM Pager [phone] till 1 pm If no answer page [phone] 11/15/2018, 10:00 AM  Attending Note:  63 year old female with PMH of COPD and CHF presenting with COPD exacerbation and acute pulmonary edema.  Overnight, intermittent agitation.  On exam, diffuse wheezing.  I reviewed CXR myself, pulmonary edema and ETT is in a good position.  Discussed with PCCM-NP.  Will continue steroids.  Diureses as ordered.  BMET in AM.  Replace electrolytes as indicated.  Will start propofol at low dose and monitor BP.  PCCM will continue to manage.  The patient is critically ill with multiple organ systems failure and requires high complexity decision making for assessment and support, frequent evaluation and titration of therapies, application of advanced monitoring technologies and extensive interpretation of multiple databases.   Critical Care Time devoted to patient care services described in this note is  32  Minutes. This time reflects time of care of this signee Dr Jennet Maduro. This critical care time does not reflect procedure time, or teaching time or supervisory time of PA/NP/Med student/Med Resident etc but could involve care discussion time.  Rush Farmer, M.D. [facility] Pulmonary/Critical Care Medicine. Pager: [phone]. After hours pager: [phone].

## 2018-11-15 NOTE — Progress Notes (Signed)
eLink Physician-Brief Progress Note Patient Name: Minha Fulco DOB: 1955/10/08 MRN: [id number]   Date of Service  11/15/2018  HPI/Events of Note  Pt needs a Propofol infusion ordered for comfort on the ventilator.  eICU Interventions  Propofol ordered.     Intervention Category Minor Interventions: Agitation / anxiety - evaluation and management  Frederik Pear 11/15/2018, 12:45 AM

## 2018-11-15 NOTE — Progress Notes (Signed)
  Echocardiogram 2D Echocardiogram has been performed.  Crystal Yates 11/15/2018, 12:57 PM

## 2018-11-15 NOTE — Progress Notes (Signed)
eLink Physician-Brief Progress Note Patient Name: Telesha Deguzman DOB: [birth year] MRN: [id number]   Date of Service  11/15/2018  HPI/Events of Note  63 yo F intubated for acute on chronic respiratory failure secondary to exacerbation of COPD. She did have a temp of 101, and a lactate level of 3,7  eICU Interventions  New patient evaluation completed.        Kerry Kass Taliyah Watrous 11/15/2018, 12:37 AM

## 2018-11-15 NOTE — Progress Notes (Signed)
CRITICAL VALUE ALERT  Critical Value:  Troponin 109  Date & Time Notied:  11/15/18 0159  Provider Notified: Lucile Shutters

## 2018-11-15 NOTE — Progress Notes (Signed)
Initial Nutrition Assessment  DOCUMENTATION CODES:   Obesity unspecified  INTERVENTION:   Tube Feeding:  Vital High Protein at 40 ml/hr Pro-Stat 30 mL BID Provides 115 g of protein, 1160 kcals and 806 mL of free water Meets 100% protein needs  TF regimen and propofol at current rate providing 1575 total kcal/day (112 % of kcal needs)   NUTRITION DIAGNOSIS:   Inadequate oral intake related to acute illness as evidenced by NPO status.  GOAL:   Patient will meet greater than or equal to 90% of their needs  MONITOR:   TF tolerance, Vent status, Labs, Weight trends  REASON FOR ASSESSMENT:   Ventilator, Consult Enteral/tube feeding initiation and management  ASSESSMENT:   63 yo female admitted with acute on chronic respiratory failure with acute on chronic CHF and SIRS requiring intubation. PMH includes COPD, CHF, DM, OSA   Husband at bedside and reports pt with good appetite prior to admission; husband reports pt with weight gain related to fluid status. Husband reports EDW 220 pounds with height of 5 foot 2 inches. Husband also reports pt's respiratory status had been making it difficult for pt to get around; pt was SOB even while sitting  Patient is currently intubated on ventilator support MV: 7.8 L/min Temp (24hrs), Avg:99 F (37.2 C), Min:98.2 F (36.8 C), Max:100.1 F (37.8 C)  Propofol: 15.7 ml/hr  Current wt 231 pounds, pt reports UBW around 220 pounds (EDW), plan to utilize EDW for estimating nutritional needs  Labs: Creatinine 1.42, BUN 31; CBGs 217-321 Meds:    NUTRITION - FOCUSED PHYSICAL EXAM:    Most Recent Value  Orbital Region  No depletion  Upper Arm Region  No depletion  Thoracic and Lumbar Region  No depletion  Buccal Region  Unable to assess  Temple Region  No depletion  Clavicle Bone Region  No depletion  Clavicle and Acromion Bone Region  No depletion  Scapular Bone Region  No depletion  Dorsal Hand  Unable to assess  Patellar Region   No depletion  Anterior Thigh Region  No depletion  Posterior Calf Region  No depletion  Edema (RD Assessment)  Moderate       Diet Order:   Diet Order            Diet NPO time specified  Diet effective now              EDUCATION NEEDS:   Not appropriate for education at this time  Skin:  Skin Assessment: Reviewed RN Assessment  Last BM:  no documented BM  Height:   Ht Readings from Last 1 Encounters:  11/14/18 5\' 3"  (1.6 m)    Weight:   Wt Readings from Last 1 Encounters:  11/15/18 104.5 kg    Ideal Body Weight:  50 kg  BMI:  Body mass index is 40.81 kg/m.  Estimated Nutritional Needs:   Kcal:  1100-1400 kcals  Protein:  100-125 g  Fluid:  >/= 1.3 L    Cate Ronny Ruddell MS, RDN, LDN, CNSC [phone] Pager  [phone] Weekend/On-Call Pager

## 2018-11-16 ENCOUNTER — Inpatient Hospital Stay (HOSPITAL_COMMUNITY): Payer: Federal, State, Local not specified - PPO

## 2018-11-16 ENCOUNTER — Ambulatory Visit: Payer: Federal, State, Local not specified - PPO | Admitting: Cardiology

## 2018-11-16 DIAGNOSIS — J441 Chronic obstructive pulmonary disease with (acute) exacerbation: Secondary | ICD-10-CM | POA: Diagnosis not present

## 2018-11-16 DIAGNOSIS — J9690 Respiratory failure, unspecified, unspecified whether with hypoxia or hypercapnia: Secondary | ICD-10-CM | POA: Diagnosis not present

## 2018-11-16 DIAGNOSIS — J9601 Acute respiratory failure with hypoxia: Secondary | ICD-10-CM | POA: Diagnosis not present

## 2018-11-16 LAB — BASIC METABOLIC PANEL
Anion gap: 10 (ref 5–15)
BUN: 36 mg/dL — ABNORMAL HIGH (ref 8–23)
CO2: 24 mmol/L (ref 22–32)
Calcium: 8.8 mg/dL — ABNORMAL LOW (ref 8.9–10.3)
Chloride: 103 mmol/L (ref 98–111)
Creatinine, Ser: 1.22 mg/dL — ABNORMAL HIGH (ref 0.44–1.00)
GFR calc Af Amer: 55 mL/min — ABNORMAL LOW (ref >60)
GFR calc non Af Amer: 47 mL/min — ABNORMAL LOW (ref >60)
Glucose, Bld: 336 mg/dL — ABNORMAL HIGH (ref 70–99)
Potassium: 3.9 mmol/L (ref 3.5–5.1)
Sodium: 137 mmol/L (ref 135–145)

## 2018-11-16 LAB — CBC WITH DIFFERENTIAL/PLATELET
Abs Immature Granulocytes: 0.08 10*3/uL — ABNORMAL HIGH (ref 0.00–0.07)
Abs Immature Granulocytes: 0.17 10*3/uL — ABNORMAL HIGH (ref 0.00–0.07)
Basophils Absolute: 0 10*3/uL (ref 0.0–0.1)
Basophils Absolute: 0 10*3/uL (ref 0.0–0.1)
Basophils Relative: 0 %
Basophils Relative: 0 %
Eosinophils Absolute: 0 10*3/uL (ref 0.0–0.5)
Eosinophils Absolute: 0 10*3/uL (ref 0.0–0.5)
Eosinophils Relative: 0 %
Eosinophils Relative: 0 %
HCT: 36.9 % (ref 36.0–46.0)
HCT: 40.8 % (ref 36.0–46.0)
Hemoglobin: 11.5 g/dL — ABNORMAL LOW (ref 12.0–15.0)
Hemoglobin: 13 g/dL (ref 12.0–15.0)
Immature Granulocytes: 1 %
Immature Granulocytes: 1 %
Lymphocytes Relative: 6 %
Lymphocytes Relative: 6 %
Lymphs Abs: 0.8 10*3/uL (ref 0.7–4.0)
Lymphs Abs: 0.9 10*3/uL (ref 0.7–4.0)
MCH: 29.1 pg (ref 26.0–34.0)
MCH: 29.2 pg (ref 26.0–34.0)
MCHC: 31.2 g/dL (ref 30.0–36.0)
MCHC: 31.9 g/dL (ref 30.0–36.0)
MCV: 93.4 fL (ref 80.0–100.0)
MCV: 91.7 fL (ref 80.0–100.0)
Monocytes Absolute: 0.5 10*3/uL (ref 0.1–1.0)
Monocytes Absolute: 0.6 10*3/uL (ref 0.1–1.0)
Monocytes Relative: 4 %
Monocytes Relative: 4 %
Neutro Abs: 11.7 10*3/uL — ABNORMAL HIGH (ref 1.7–7.7)
Neutro Abs: 13.1 10*3/uL — ABNORMAL HIGH (ref 1.7–7.7)
Neutrophils Relative %: 89 %
Neutrophils Relative %: 89 %
Platelets: 190 10*3/uL (ref 150–400)
Platelets: 220 10*3/uL (ref 150–400)
RBC: 3.95 MIL/uL (ref 3.87–5.11)
RBC: 4.45 MIL/uL (ref 3.87–5.11)
RDW: 14.9 % (ref 11.5–15.5)
RDW: 15.2 % (ref 11.5–15.5)
WBC: 13.1 10*3/uL — ABNORMAL HIGH (ref 4.0–10.5)
WBC: 14.8 10*3/uL — ABNORMAL HIGH (ref 4.0–10.5)
nRBC: 0 % (ref 0.0–0.2)
nRBC: 0 % (ref 0.0–0.2)

## 2018-11-16 LAB — COMPREHENSIVE METABOLIC PANEL
ALT: 42 U/L (ref 0–44)
AST: 22 U/L (ref 15–41)
Albumin: 2.7 g/dL — ABNORMAL LOW (ref 3.5–5.0)
Alkaline Phosphatase: 72 U/L (ref 38–126)
Anion gap: 10 (ref 5–15)
BUN: 38 mg/dL — ABNORMAL HIGH (ref 8–23)
CO2: 26 mmol/L (ref 22–32)
Calcium: 9.4 mg/dL (ref 8.9–10.3)
Chloride: 103 mmol/L (ref 98–111)
Creatinine, Ser: 1.43 mg/dL — ABNORMAL HIGH (ref 0.44–1.00)
GFR calc Af Amer: 45 mL/min — ABNORMAL LOW (ref >60)
GFR calc non Af Amer: 39 mL/min — ABNORMAL LOW (ref >60)
Glucose, Bld: 225 mg/dL — ABNORMAL HIGH (ref 70–99)
Potassium: 3.8 mmol/L (ref 3.5–5.1)
Sodium: 139 mmol/L (ref 135–145)
Total Bilirubin: 0.7 mg/dL (ref 0.3–1.2)
Total Protein: 6.3 g/dL — ABNORMAL LOW (ref 6.5–8.1)

## 2018-11-16 LAB — URINE CULTURE: Culture: NO GROWTH

## 2018-11-16 LAB — MAGNESIUM
Magnesium: 1.9 mg/dL (ref 1.7–2.4)
Magnesium: 2 mg/dL (ref 1.7–2.4)

## 2018-11-16 LAB — LACTIC ACID, PLASMA: Lactic Acid, Venous: 2.3 mmol/L (ref 0.5–1.9)

## 2018-11-16 LAB — GLUCOSE, CAPILLARY
Glucose-Capillary: 323 mg/dL — ABNORMAL HIGH (ref 70–99)
Glucose-Capillary: 284 mg/dL — ABNORMAL HIGH (ref 70–99)
Glucose-Capillary: 71 mg/dL (ref 70–99)
Glucose-Capillary: 301 mg/dL — ABNORMAL HIGH (ref 70–99)
Glucose-Capillary: 229 mg/dL — ABNORMAL HIGH (ref 70–99)

## 2018-11-16 LAB — PHOSPHORUS
Phosphorus: 2.9 mg/dL (ref 2.5–4.6)
Phosphorus: 2.6 mg/dL (ref 2.5–4.6)

## 2018-11-16 LAB — CK: Total CK: 33 U/L — ABNORMAL LOW (ref 38–234)

## 2018-11-16 MED ORDER — HYDROCORTISONE 5 MG PO TABS
5.0000 mg | ORAL_TABLET | Freq: Every day | ORAL | Status: DC
  Administered 2018-11-16: 13:00:00 5 mg via ORAL
  Filled 2018-11-16: qty 1

## 2018-11-16 MED ORDER — HYDROCORTISONE NA SUCCINATE PF 100 MG IJ SOLR
50.0000 mg | Freq: Two times a day (BID) | INTRAMUSCULAR | Status: DC
  Administered 2018-11-16 – 2018-11-18 (×4): 50 mg via INTRAVENOUS
  Filled 2018-11-16 (×4): qty 2

## 2018-11-16 MED ORDER — INSULIN ASPART 100 UNIT/ML ~~LOC~~ SOLN
0.0000 [IU] | Freq: Three times a day (TID) | SUBCUTANEOUS | Status: DC
  Administered 2018-11-17: 11 [IU] via SUBCUTANEOUS
  Administered 2018-11-17: 17:00:00 7 [IU] via SUBCUTANEOUS
  Administered 2018-11-17: 12:00:00 15 [IU] via SUBCUTANEOUS
  Administered 2018-11-18: 06:00:00 7 [IU] via SUBCUTANEOUS

## 2018-11-16 MED ORDER — ACETAMINOPHEN 500 MG PO TABS
500.0000 mg | ORAL_TABLET | Freq: Four times a day (QID) | ORAL | Status: DC | PRN
  Administered 2018-11-16 – 2018-11-17 (×2): 500 mg via ORAL
  Filled 2018-11-16 (×3): qty 1

## 2018-11-16 MED ORDER — ZOLPIDEM TARTRATE 5 MG PO TABS
5.0000 mg | ORAL_TABLET | Freq: Every evening | ORAL | Status: DC | PRN
  Administered 2018-11-16 – 2018-11-17 (×2): 5 mg via ORAL
  Filled 2018-11-16 (×2): qty 1

## 2018-11-16 MED ORDER — ASPIRIN 81 MG PO CHEW
81.0000 mg | CHEWABLE_TABLET | Freq: Every day | ORAL | Status: DC
  Administered 2018-11-17 – 2018-11-18 (×2): 81 mg via ORAL
  Filled 2018-11-16 (×2): qty 1

## 2018-11-16 MED ORDER — ORAL CARE MOUTH RINSE
15.0000 mL | Freq: Two times a day (BID) | OROMUCOSAL | Status: DC
  Administered 2018-11-16 – 2018-11-18 (×4): 15 mL via OROMUCOSAL

## 2018-11-16 MED ORDER — IPRATROPIUM-ALBUTEROL 0.5-2.5 (3) MG/3ML IN SOLN
3.0000 mL | Freq: Three times a day (TID) | RESPIRATORY_TRACT | Status: DC
  Administered 2018-11-16 – 2018-11-18 (×7): 3 mL via RESPIRATORY_TRACT
  Filled 2018-11-16 (×8): qty 3

## 2018-11-16 MED ORDER — INSULIN ASPART 100 UNIT/ML ~~LOC~~ SOLN
0.0000 [IU] | Freq: Every day | SUBCUTANEOUS | Status: DC
  Administered 2018-11-16 – 2018-11-17 (×2): 2 [IU] via SUBCUTANEOUS

## 2018-11-16 MED ORDER — PRO-STAT SUGAR FREE PO LIQD
30.0000 mL | Freq: Two times a day (BID) | ORAL | Status: DC
  Administered 2018-11-16 – 2018-11-18 (×4): 30 mL via ORAL
  Filled 2018-11-16 (×4): qty 30

## 2018-11-16 MED ORDER — LEVOTHYROXINE SODIUM 75 MCG PO TABS
150.0000 ug | ORAL_TABLET | Freq: Every day | ORAL | Status: DC
  Administered 2018-11-17 – 2018-11-18 (×2): 150 ug via ORAL
  Filled 2018-11-16 (×2): qty 2

## 2018-11-16 MED ORDER — FAMOTIDINE 40 MG/5ML PO SUSR
20.0000 mg | Freq: Two times a day (BID) | ORAL | Status: DC
  Administered 2018-11-16 – 2018-11-18 (×4): 20 mg via ORAL
  Filled 2018-11-16 (×5): qty 2.5

## 2018-11-16 MED ORDER — HYDROCORTISONE 5 MG PO TABS
5.0000 mg | ORAL_TABLET | Freq: Every day | ORAL | Status: DC
  Filled 2018-11-16: qty 1

## 2018-11-16 NOTE — Progress Notes (Signed)
Patient with critical lab value of lactic 2.3 called in to  [facility].

## 2018-11-16 NOTE — Progress Notes (Signed)
NAME:  Crystal Yates, MRN:  [id number], DOB:  [birth year], LOS: 2 ADMISSION DATE:  11/14/2018, CONSULTATION DATE:  11/14/2018 REFERRING MD:  Dr. Ronnald Nian, CHIEF COMPLAINT:  Resp failure  Brief History   63 yoF with hx of COPD and HF intubated for acute on chronic respiratory failure, SIRS, and acute on chronic heart failure.    History of present illness   HPI obtained from medical chart review as patient is currently intubated and sedated on mechanical ventilation.  Unable to reach spouse by telephone.   63 year old female with extensive PMH significant for COPD on 2L, OSA ongoing tobacco abuse, systolic/ diastolic HF, CAD, thyroid disease, adrenal insufficiency/ empty sella syndrome, Sjogrens syndrome, and IDDM presenting from home with worsening SOB and hypoxia.    Of note, patient evaluated in ER on 8/16 with progressive 2-3 day history of SOB, lower extremity swelling, and chronic chest pain after home diuretics were recently changed from zaroxolyn to lasix found to be overloaded with stable vitals and labs with good diuresis with 100mg  Lasix in ER.  Followed up with at [facility] 8/18 with minimal symptomatic improvement but stable weight with ongoing lasix BID and zaroxoyln.  Arrived on by EMS from home with work of breathing not improved with O2, steroids, and CPAP.  Found to be mildly hypertensive, rectal temp 100, tachypneic and tachycardic.  She required intubation for ongoing respiratory distress on arrival.  Labs noted for WBC 20.6, glucose 341, sCr 1.32, BNP 785, hs-trop 58, Lactic acid 3.7, mild AST/ ALT, UA negative, pan-cultured, EKG showing ST with rate in 150s with prolonged QTc.  CXR with bibasilar atelectasis without focal infiltrate with stable ETT/ OGT placement.  Post intubation ABG noted for 7.245/ 80.7/ 419/ 37.  Treated with 1L LR bolus, sedated on propofol, and empirically started on vancomycin and cefepime.  PCCM called for admit.   Past Medical History  She,  has a  past medical history of Acute on chronic systolic heart failure exacerbation(HCC) (04/08/2016), Arthritis, CAD in native artery, Chronic combined systolic and diastolic CHF (congestive heart failure) ([place]), CKD (chronic kidney disease), stage II, COPD (chronic obstructive pulmonary disease) ([place]), Diabetes mellitus without complication ([place]), Hashimoto's thyroiditis, Hyperlipidemia, Hypertension, On home oxygen therapy, Secondary adrenal insufficiency ([place]), Thyroid disease, and Tobacco abuse. OSA  Significant Hospital Events   8/19 Admitted  Consults:   Procedures:  8/19 ETT >> 11/16/2018  Significant Diagnostic Tests:   Micro Data:  8/19 SARS CoV2 >> negative 8/19 BCx 2 >> 8/19 UC >> negative 8/19 trach asp >> reintubated for better breath>>  Antimicrobials:  8/19 vancomycin >> 8/19 cefepime >>  Interim history/subjective:  Meets criteria for extubation.  Objective   Blood pressure 135/72, pulse 93, temperature 97.7 F (36.5 C), temperature source Oral, resp. rate (!) 26, height 5\' 3"  (1.6 m), weight 104.4 kg, SpO2 96 %.    Vent Mode: PSV;CPAP FiO2 (%):  [30 %-40 %] 40 % Set Rate:  [20 bmp] 20 bmp Vt Set:  [400 mL-410 mL] 410 mL PEEP:  [5 cmH20] 5 cmH20 Pressure Support:  [5 cmH20] 5 cmH20 Plateau Pressure:  [16 cmH20-21 cmH20] 16 cmH20   Intake/Output Summary (Last 24 hours) at 11/16/2018 [id number] Last data filed at 11/16/2018 0800 Gross per 24 hour  Intake 1128.3 ml  Output 808 ml  Net 320.3 ml   Filed Weights   11/15/18 0030 11/15/18 0500 11/16/18 0500  Weight: 104.5 kg 104.5 kg 104.4 kg   Examination: General: Obese female awake  alert no acute distress HEENT: Endotracheal tube in place Neuro: Grossly intact CV: Heart sounds are regular regular rate and rhythm PULM: Bilateral rhonchi is noted GI: soft, bsx4 active  Extremities: warm/dry, 1+ edema  Skin: no rashes or lesions    Resolved Hospital Problem list    Assessment & Plan:   Acute on chronic  hypercarbic/ hypoxic respiratory failure- multifactorial AECOPD Tobacco abuse Obstructive sleep apnea with noncompliance of CPAP P:  11/16/2018 extubate Nocturnal CPAP Follow-up with her pulmonologist and as per order Dr. Carren Rang   SIRS with leukocytosis - possibly reactive/ steroid induced given  - no clear infiltrate on CXR, UA ok  P: Continue to monitor WBC and fever curve Pro calcitonin was normal Discontinue antibiotics on 11/16/2018   Acute on chronic systolic/ diastolic HF Hx HTN, HLD, CAD s/p RCA stentl  - last TTE 06/2018 with EF 40-50 %, limited study, global hypokinesis  -2D echo 11/15/2018 reveals left ventricle with moderate to severely reduced systolic function EF of 35 to 40%.  Left ventricular cavity is mildly dilated.  Elevated left end-diastolic pressure severe hypokinesis of the mild and basilar inferior inferolateral walls. P:   ICU monitoring No diuresis at this time Goal mean arterial pressure greater than 65 Continue home  ASA, plavix, lipitor 2D echo pending   Mild AKI NAGMA Lab Results  Component Value Date   CREATININE 1.22 (H) 11/16/2018   CREATININE 1.42 (H) 11/15/2018   CREATININE 1.32 (H) 11/14/2018   Recent Labs  Lab 11/15/18 0036 11/15/18 0819 11/16/18 0251  NA 141 141 137   Recent Labs  Lab 11/15/18 0036 11/15/18 0819 11/16/18 0251  K 4.3 4.0 3.9    P:  Continue to monitor renal function and electrolytes Replete electrolytes as needed   IDDM CBG (last 3)  Recent Labs    11/15/18 2334 11/16/18 0337 11/16/18 0731  GLUCAP 233* 323* 284*    P:  Sliding scale insulin Discontinue IV steroids Continue Lantus 20 mg daily  Hypothyroidism AI/ empty sella syndrome- on home hydrocortisone  P:  Continue Synthroid Back home steroid regimen of Cortef 3 tablets in the morning 1 tablet in the evening of 5 mg tablet    Best practice:  Diet: Low carbohydrate diabetic diet Pain/Anxiety/Delirium protocol (if indicated): RASS  goal 1 discontinue all sedation is 11/16/2018 VAP protocol (if indicated): yes DVT prophylaxis: heparin SQ/ SCDs GI prophylaxis: pepcid per tube Glucose control: SSI/ lantus will also go to home steroid regiment from IV corticosteroids  Mobility: BR  Code Status: full  Family Communication: 11/16/2018 updated patient at bedside. Disposition: ICU  Labs   CBC: Recent Labs  Lab 11/11/18 0247 11/14/18 2023 11/14/18 2234 11/15/18 0036 11/15/18 0819 11/16/18 0251  WBC 9.4 20.6*  --  19.6*  --  13.1*  NEUTROABS  --  13.8*  --   --   --  11.7*  HGB 13.3 14.7 14.6 13.3 12.9 11.5*  HCT 41.6 48.1* 43.0 42.2 38.0 36.9  MCV 92.2 96.8  --  93.8  --  93.4  PLT 225 298  --  232  --  [id number]    Basic Metabolic Panel: Recent Labs  Lab 11/11/18 0247 11/14/18 2023 11/14/18 2234 11/15/18 0036 11/15/18 0819 11/15/18 1049 11/15/18 1839 11/16/18 0251  NA 138 140 139 141 141  --   --  137  K 3.5 4.0 3.9 4.3 4.0  --   --  3.9  CL 99 100  --  102  --   --   --  103  CO2 30 26  --  29  --   --   --  24  GLUCOSE 200* 341*  --  320*  --   --   --  336*  BUN 16 31*  --  34*  --   --   --  36*  CREATININE 1.14* 1.32*  --  1.42*  --   --   --  1.22*  CALCIUM 10.2 10.0  --  9.4  --   --   --  8.8*  MG  --   --   --  2.0  --  1.9 1.9 1.9  PHOS  --   --   --  4.1  --  3.1 3.3 2.9   GFR: Estimated Creatinine Clearance: 54.5 mL/min (A) (by C-G formula based on SCr of 1.22 mg/dL (H)). Recent Labs  Lab 11/11/18 0247 11/14/18 2023 11/14/18 2024 11/14/18 2253 11/15/18 0036 11/16/18 0251  PROCALCITON  --   --   --   --  <0.10  --   WBC 9.4 20.6*  --   --  19.6* 13.1*  LATICACIDVEN  --   --  3.7* 2.5*  --   --     Liver Function Tests: Recent Labs  Lab 11/14/18 2023 11/15/18 0036  AST 78* 103*  ALT 47* 73*  ALKPHOS 87 82  BILITOT 0.4 1.1  PROT 7.5 6.3*  ALBUMIN 3.5 3.0*   No results for input(s): LIPASE, AMYLASE in the last 168 hours. No results for input(s): AMMONIA in the last 168  hours.  ABG    Component Value Date/Time   PHART 7.382 11/15/2018 0819   PCO2ART 48.6 (H) 11/15/2018 0819   PO2ART 109.0 (H) 11/15/2018 0819   HCO3 28.9 (H) 11/15/2018 0819   TCO2 30 11/15/2018 0819   ACIDBASEDEF 2.7 (H) 07/20/2018 0005   O2SAT 98.0 11/15/2018 0819     Coagulation Profile: Recent Labs  Lab 11/15/18 0036  INR 0.9    Cardiac Enzymes: No results for input(s): CKTOTAL, CKMB, CKMBINDEX, TROPONINI in the last 168 hours.  HbA1C: Hgb A1c MFr Bld  Date/Time Value Ref Range Status  11/15/2018 12:36 AM 9.3 (H) 4.8 - 5.6 % Final    Comment:    (NOTE) Pre diabetes:          5.7%-6.4% Diabetes:              >6.4% Glycemic control for   <7.0% adults with diabetes   08/07/2018 05:31 AM 8.6 (H) 4.8 - 5.6 % Final    Comment:    (NOTE) Pre diabetes:          5.7%-6.4% Diabetes:              >6.4% Glycemic control for   <7.0% adults with diabetes     CBG: Recent Labs  Lab 11/15/18 1700 11/15/18 1936 11/15/18 2334 11/16/18 0337 11/16/18 0731  GLUCAP 167* 145* 233* 323* 284*      Critical care time: 30 mins    Richardson Landry Kiani Wurtzel ACNP Maryanna Shape PCCM Pager [phone] till 1 pm If no answer page [phone] 11/16/2018, 9:07 AM

## 2018-11-16 NOTE — Progress Notes (Signed)
Per protocol bedside swallow screen performed. Lungs diminished with expiratory wheezing bilaterally prior to screen. Pt. coughed a few times when asked to deep breathe, however pt. noted she also did this at home occasionally. After drinking 30-64ml of water pt had no coughing. Pt. swallowed apple sauce w/o complications as well as eating two saltine crackers w/o complications. Lungs remained diminished with mild expiratory wheezing post screen. Carb mod/ heart healthy diet to be ordered per verbal orders via CCM.

## 2018-11-16 NOTE — Progress Notes (Signed)
Tube feeding stopped at [phone]

## 2018-11-16 NOTE — Procedures (Signed)
Extubation Procedure Note  Patient Details:   Name: Crystal Yates DOB: Aug 22, 1955 MRN: [id number]   Airway Documentation:    Vent end date: 11/16/18 Vent end time: 0915   Evaluation  O2 sats: stable throughout Complications: No apparent complications Patient did tolerate procedure well. Bilateral Breath Sounds: Diminished   Yes, Placed on 4L [place]  SPO2 95%  Gonzella Lex 11/16/2018, 9:19 AM

## 2018-11-16 NOTE — Evaluation (Signed)
Physical Therapy Evaluation Patient Details Name: Crystal Yates MRN: [id number] DOB: 05/16/1955 Today's Date: 11/16/2018   History of Present Illness  Pt adm with acute respiratory failure with hypoxia secondary to COPD exacerbation.Pmh CAD, CHF, DM, HTN, obesity, COPD, CKD and sx hx of lapy for SBO.  Clinical Impression  Pt admitted with above diagnosis and presents to PT with functional limitations due to deficits listed below (See PT problem list). Pt needs skilled PT to maximize independence and safety to allow discharge to home with family.      Follow Up Recommendations No PT follow up    Equipment Recommendations  None recommended by PT    Recommendations for Other Services       Precautions / Restrictions Precautions Precautions: None Restrictions Weight Bearing Restrictions: No      Mobility  Bed Mobility Overal bed mobility: Needs Assistance Bed Mobility: Supine to Sit     Supine to sit: Supervision;HOB elevated     General bed mobility comments: Assist for lines/tubes  Transfers Overall transfer level: Needs assistance Equipment used: 4-wheeled walker Transfers: Sit to/from Stand Sit to Stand: Min guard         General transfer comment: Assist for safety and lines  Ambulation/Gait Ambulation/Gait assistance: Min guard Gait Distance (Feet): 170 Feet Assistive device: 4-wheeled walker Gait Pattern/deviations: Step-through pattern;Decreased stride length Gait velocity: decr Gait velocity interpretation: 1.31 - 2.62 ft/sec, indicative of limited community ambulator General Gait Details: Assist for safety and lines. Amb on 4L of O2 with SpO2 >94%  Stairs            Wheelchair Mobility    Modified Rankin (Stroke Patients Only)       Balance Overall balance assessment: Mild deficits observed, not formally tested                                           Pertinent Vitals/Pain Pain Assessment: No/denies pain     Home Living Family/patient expects to be discharged to:: Private residence Living Arrangements: Spouse/significant other Available Help at Discharge: Family;Available PRN/intermittently Type of Home: House Home Access: Stairs to enter Entrance Stairs-Rails: Left Entrance Stairs-Number of Steps: 4 Home Layout: One level Home Equipment: Shower seat;Walker - 4 wheels;Grab bars - tub/shower;Grab bars - toilet;Cane - single point;Adaptive equipment      Prior Function Level of Independence: Needs assistance   Gait / Transfers Assistance Needed: modified independent with rollator and O2           Hand Dominance   Dominant Hand: Left    Extremity/Trunk Assessment   Upper Extremity Assessment Upper Extremity Assessment: Defer to OT evaluation    Lower Extremity Assessment Lower Extremity Assessment: Generalized weakness       Communication   Communication: No difficulties  Cognition Arousal/Alertness: Awake/alert Behavior During Therapy: WFL for tasks assessed/performed Overall Cognitive Status: Within Functional Limits for tasks assessed                                        General Comments      Exercises     Assessment/Plan    PT Assessment Patient needs continued PT services  PT Problem List Decreased strength;Decreased activity tolerance;Decreased balance;Decreased mobility;Cardiopulmonary status limiting activity       PT Treatment Interventions DME instruction;Gait  training;Functional mobility training;Therapeutic activities;Therapeutic exercise;Patient/family education    PT Goals (Current goals can be found in the Care Plan section)  Acute Rehab PT Goals Patient Stated Goal: return home PT Goal Formulation: With patient Time For Goal Achievement: 11/30/18 Potential to Achieve Goals: Good    Frequency Min 3X/week   Barriers to discharge        Co-evaluation               AM-PAC PT "6 Clicks" Mobility  Outcome  Measure Help needed turning from your back to your side while in a flat bed without using bedrails?: None Help needed moving from lying on your back to sitting on the side of a flat bed without using bedrails?: None Help needed moving to and from a bed to a chair (including a wheelchair)?: A Little Help needed standing up from a chair using your arms (e.g., wheelchair or bedside chair)?: A Little Help needed to walk in hospital room?: A Little Help needed climbing 3-5 steps with a railing? : A Little 6 Click Score: 20    End of Session Equipment Utilized During Treatment: Oxygen Activity Tolerance: Patient tolerated treatment well Patient left: in chair;with call bell/phone within reach;with family/visitor present Nurse Communication: Mobility status PT Visit Diagnosis: Muscle weakness (generalized) (M62.81);Other abnormalities of gait and mobility (R26.89)    Time: [id number] PT Time Calculation (min) (ACUTE ONLY): 26 min   Charges:   PT Evaluation $PT Eval Moderate Complexity: 1 Mod PT Treatments $Gait Training: 8-22 mins       [place] Pager [phone] Office [place] 11/16/2018, 4:42 PM

## 2018-11-17 ENCOUNTER — Inpatient Hospital Stay (HOSPITAL_COMMUNITY): Payer: Federal, State, Local not specified - PPO

## 2018-11-17 DIAGNOSIS — J441 Chronic obstructive pulmonary disease with (acute) exacerbation: Secondary | ICD-10-CM | POA: Diagnosis not present

## 2018-11-17 DIAGNOSIS — I5043 Acute on chronic combined systolic (congestive) and diastolic (congestive) heart failure: Secondary | ICD-10-CM | POA: Diagnosis not present

## 2018-11-17 DIAGNOSIS — J9690 Respiratory failure, unspecified, unspecified whether with hypoxia or hypercapnia: Secondary | ICD-10-CM | POA: Diagnosis not present

## 2018-11-17 DIAGNOSIS — J449 Chronic obstructive pulmonary disease, unspecified: Secondary | ICD-10-CM | POA: Diagnosis not present

## 2018-11-17 DIAGNOSIS — J9601 Acute respiratory failure with hypoxia: Secondary | ICD-10-CM | POA: Diagnosis not present

## 2018-11-17 DIAGNOSIS — E872 Acidosis: Secondary | ICD-10-CM | POA: Diagnosis not present

## 2018-11-17 LAB — CBC WITH DIFFERENTIAL/PLATELET
Abs Immature Granulocytes: 0.13 10*3/uL — ABNORMAL HIGH (ref 0.00–0.07)
Basophils Absolute: 0 10*3/uL (ref 0.0–0.1)
Basophils Relative: 0 %
Eosinophils Absolute: 0 10*3/uL (ref 0.0–0.5)
Eosinophils Relative: 0 %
HCT: 36.3 % (ref 36.0–46.0)
Hemoglobin: 11.4 g/dL — ABNORMAL LOW (ref 12.0–15.0)
Immature Granulocytes: 1 %
Lymphocytes Relative: 17 %
Lymphs Abs: 2 10*3/uL (ref 0.7–4.0)
MCH: 29.2 pg (ref 26.0–34.0)
MCHC: 31.4 g/dL (ref 30.0–36.0)
MCV: 93.1 fL (ref 80.0–100.0)
Monocytes Absolute: 0.6 10*3/uL (ref 0.1–1.0)
Monocytes Relative: 5 %
Neutro Abs: 9 10*3/uL — ABNORMAL HIGH (ref 1.7–7.7)
Neutrophils Relative %: 77 %
Platelets: 191 10*3/uL (ref 150–400)
RBC: 3.9 MIL/uL (ref 3.87–5.11)
RDW: 15.2 % (ref 11.5–15.5)
WBC: 11.8 10*3/uL — ABNORMAL HIGH (ref 4.0–10.5)
nRBC: 0 % (ref 0.0–0.2)

## 2018-11-17 LAB — GLUCOSE, CAPILLARY
Glucose-Capillary: 278 mg/dL — ABNORMAL HIGH (ref 70–99)
Glucose-Capillary: 316 mg/dL — ABNORMAL HIGH (ref 70–99)
Glucose-Capillary: 232 mg/dL — ABNORMAL HIGH (ref 70–99)
Glucose-Capillary: 298 mg/dL — ABNORMAL HIGH (ref 70–99)

## 2018-11-17 LAB — CULTURE, RESPIRATORY W GRAM STAIN: Culture: NORMAL

## 2018-11-17 LAB — MAGNESIUM: Magnesium: 2 mg/dL (ref 1.7–2.4)

## 2018-11-17 LAB — PHOSPHORUS: Phosphorus: 2.6 mg/dL (ref 2.5–4.6)

## 2018-11-17 NOTE — Progress Notes (Signed)
Patient with 7 beats vtach and run of wide QRS,  Patient asymptomatic. [facility] notified.

## 2018-11-17 NOTE — Progress Notes (Signed)
Occupational Therapy Evaluation Patient Details Name: Crystal Yates MRN: [id number] DOB: 12/24/1955 Today's Date: 11/17/2018    History of Present Illness Pt adm with acute respiratory failure with hypoxia secondary to COPD exacerbation.Pmh CAD, CHF, DM, HTN, obesity, COPD, CKD and sx hx of lapy for SBO.   Clinical Impression   PTA, pt was living at home with her husband, and received assistance from her husband as needed with ADL/IADL and was modified independent with functional mobility at rollator level. Pt on 3lnc at baseline. Pt currently reports functioning close to baseline, requiring minguardA for ADL/IADL and functional mobility at rollator leve. Began discussion about energy conservation strategies during ADL, pt reports she is familiar, but would benefit from continued OT to focus on utilization of energy conservation strategies during ADL. Pt on 3lnc throughout session, spo2 >96% with ambulation in the hallway ~75 feet with 4 standing rest breaks. Pt will continue to benefit from skilled OT services to maximize safety and independence with ADL/IADL and functional mobility. Will continue to follow acutely and progress as tolerated.      Follow Up Recommendations  No OT follow up    Equipment Recommendations  None recommended by OT    Recommendations for Other Services       Precautions / Restrictions Precautions Precautions: Fall Restrictions Weight Bearing Restrictions: No      Mobility Bed Mobility Overal bed mobility: Needs Assistance Bed Mobility: Supine to Sit;Sit to Supine     Supine to sit: Supervision;HOB elevated Sit to supine: Supervision;HOB elevated      Transfers Overall transfer level: Needs assistance Equipment used: 4-wheeled walker Transfers: Sit to/from Stand Sit to Stand: Min guard         General transfer comment: Assist for safety and lines    Balance Overall balance assessment: Mild deficits observed, not formally  tested                                         ADL either performed or assessed with clinical judgement   ADL Overall ADL's : At baseline                                     Functional mobility during ADLs: Min guard General ADL Comments: pt completes ADL at minguard level uses RW for functional mobility;Pt on 3lnc spo2 >96% throughout session;pt and husband demonstrate excellent awareness of activity progression, pt's husband very supportive during session;pt reports this is her baseline;educated pt on energy conservation strategies to implement during ADL completion;     Vision Baseline Vision/History: Wears glasses Patient Visual Report: No change from baseline       Perception     Praxis      Pertinent Vitals/Pain Pain Assessment: No/denies pain     Hand Dominance Left   Extremity/Trunk Assessment Upper Extremity Assessment Upper Extremity Assessment: Overall WFL for tasks assessed   Lower Extremity Assessment Lower Extremity Assessment: Generalized weakness   Cervical / Trunk Assessment Cervical / Trunk Assessment: Normal   Communication Communication Communication: No difficulties   Cognition Arousal/Alertness: Awake/alert Behavior During Therapy: WFL for tasks assessed/performed Overall Cognitive Status: Within Functional Limits for tasks assessed  General Comments: pt demonstrates good safety awareness and good insight into her abilities and deficits   General Comments  husband present during session;pt discussed desire for smoking cessation    Exercises     Shoulder Instructions      Home Living Family/patient expects to be discharged to:: Private residence Living Arrangements: Spouse/significant other Available Help at Discharge: Family;Available PRN/intermittently Type of Home: House Home Access: Stairs to enter [employer] of Steps: 4 Entrance  Stairs-Rails: Left Home Layout: One level     Bathroom Shower/Tub: Walk-in shower;Tub/shower unit(walk in tub)   Bathroom Toilet: Standard Bathroom Accessibility: Yes How Accessible: Accessible via walker Home Equipment: Shower seat;Walker - 4 wheels;Grab bars - tub/shower;Grab bars - toilet;Cane - single point;Adaptive equipment Adaptive Equipment: Sock aid;Reacher        Prior Functioning/Environment Level of Independence: Needs assistance  Gait / Transfers Assistance Needed: modified independent with rollator and O2 ADL's / Homemaking Assistance Needed: mod independent with ADLs using AE (sock aide) and DME   Comments: husband assists pt PRN        OT Problem List: Cardiopulmonary status limiting activity;Decreased activity tolerance      OT Treatment/Interventions: Energy conservation;DME and/or AE instruction;Patient/family education    OT Goals(Current goals can be found in the care plan section) Acute Rehab OT Goals Patient Stated Goal: return home OT Goal Formulation: With patient Time For Goal Achievement: 12/01/18 Potential to Achieve Goals: Good ADL Goals Pt Will Transfer to Toilet: with modified independence;ambulating Additional ADL Goal #1: Pt will independently utilize 3 energy conservation strategies during ADL completion.  OT Frequency: Min 2X/week   Barriers to D/C:            Co-evaluation              AM-PAC OT "6 Clicks" Daily Activity     Outcome Measure Help from another person eating meals?: A Little Help from another person taking care of personal grooming?: A Little Help from another person toileting, which includes using toliet, bedpan, or urinal?: A Little Help from another person bathing (including washing, rinsing, drying)?: A Little Help from another person to put on and taking off regular upper body clothing?: A Little Help from another person to put on and taking off regular lower body clothing?: A Little 6 Click Score: 18    End of Session Equipment Utilized During Treatment: Rolling walker;Oxygen(3lnc) Nurse Communication: Mobility status  Activity Tolerance: Patient tolerated treatment well Patient left: in bed;with call bell/phone within reach;with family/visitor present;with bed alarm set  OT Visit Diagnosis: Other abnormalities of gait and mobility (R26.89)                Time: [id number] OT Time Calculation (min): 27 min Charges:  OT General Charges $OT Visit: 1 Visit OT Evaluation $OT Eval Moderate Complexity: 1 Mod OT Treatments $Self Care/Home Management : 8-22 mins  Dorinda Hill OTR/L Acute Rehabilitation Services Office: [place] 11/17/2018, 1:20 PM

## 2018-11-17 NOTE — Progress Notes (Signed)
Patient is currently on NIV tolerating it well.

## 2018-11-17 NOTE — Progress Notes (Signed)
Patient had 9 beats of vtach.  Asymptomatic.  [place] notified.

## 2018-11-17 NOTE — Progress Notes (Signed)
RT placed pt on CPAP dream station on home setting of 9 cmH2O w/3Lpm bled into the system. Pt respiratory status stable at this time. RT will continue to monitor.

## 2018-11-17 NOTE — Progress Notes (Signed)
Patient CBG 278 at 0620.

## 2018-11-17 NOTE — Progress Notes (Signed)
PROGRESS NOTE  Crystal Yates [id number] DOB: 1955-11-28 DOA: 11/14/2018 PCP: Dortha Kern, PA  Brief History   Briefly, Crystal Yates is a 63 y.o. female with copd, chf, cad, empty sella syndrome, here with copd exacerbation.  Upon admissison she was intubated and admitted to the ICU on 11/14/2018. She was extubated on 11/16/2018 and transferred to the floor. She is on 3 LO2 at home chronically. She is currently at her baseline O2 requirements.  Consultants  . PCCM  Procedures  . Intubation and mechanical ventilation  Antibiotics   Anti-infectives (From admission, onward)   Start     Dose/Rate Route Frequency Ordered Stop   11/15/18 2300  vancomycin (VANCOCIN) IVPB 1000 mg/200 mL premix  Status:  Discontinued     1,000 mg 200 mL/hr over 60 Minutes Intravenous Every 24 hours 11/14/18 2152 11/16/18 0907   11/15/18 1030  ceFEPIme (MAXIPIME) 2 g in sodium chloride 0.9 % 100 mL IVPB  Status:  Discontinued     2 g 200 mL/hr over 30 Minutes Intravenous Every 12 hours 11/14/18 2152 11/16/18 0907   11/14/18 2145  ceFEPIme (MAXIPIME) 2 g in sodium chloride 0.9 % 100 mL IVPB     2 g 200 mL/hr over 30 Minutes Intravenous  Once 11/14/18 2138 11/14/18 2355   11/14/18 2145  vancomycin (VANCOCIN) 2,000 mg in sodium chloride 0.9 % 500 mL IVPB     2,000 mg 250 mL/hr over 120 Minutes Intravenous  Once 11/14/18 2138 11/15/18 0156    .   Subjective  The patient is resting comfortably in bed. No new complaints.  Objective   Vitals:  Vitals:   11/17/18 0849 11/17/18 1136  BP: (!) 126/56 (!) 105/59  Pulse: 100 83  Resp:  18  Temp:  98.4 F (36.9 C)  SpO2: 92% 96%   Exam:  Constitutional:  . The patient is awake, alert, and oriented x 3. No acute distress. Respiratory:  . Diminished breath sounds. . No increased work of breathing. . No wheezes, rales, or rhonchi. . No tactile fremitus Cardiovascular:  . Regular rate and rhythm . No murmurs, ectopy, or  gallups. . No lateral PMI. No thrills. Abdomen:  . Abdomen is soft, non-tender, non-distended . No hernias, masses, or organomegaly . Normoactive bowel sounds.  Musculoskeletal:  . No cyanosis, clubbing, or edema Skin:  . No rashes, lesions, ulcers . palpation of skin: no induration or nodules Neurologic:  . CN 2-12 intact . Sensation all 4 extremities intact Psychiatric:  . Mental status o Mood, affect appropriate o Orientation to person, place, time  . judgment and insight appear intact  I have personally reviewed the following:   Today's Data  . Vitals, cbc, magnesium and phosphorus.  Scheduled Meds: . aspirin  81 mg Oral Daily  . atorvastatin  80 mg Oral QPM  . Chlorhexidine Gluconate Cloth  6 each Topical Q0600  . clopidogrel  75 mg Oral QHS  . DULoxetine  120 mg Oral Daily  . famotidine  20 mg Oral BID  . feeding supplement (PRO-STAT SUGAR FREE 64)  30 mL Oral BID  . heparin  5,000 Units Subcutaneous Q8H  . hydrocortisone sod succinate (SOLU-CORTEF) inj  50 mg Intravenous Q12H  . insulin aspart  0-20 Units Subcutaneous TID WC  . insulin aspart  0-5 Units Subcutaneous QHS  . insulin glargine  20 Units Subcutaneous QHS  . ipratropium-albuterol  3 mL Nebulization TID  . levothyroxine  150 mcg Oral Q0600  . mouth  rinse  15 mL Mouth Rinse BID   Continuous Infusions: . sodium chloride Stopped (11/16/18 0936)    Active Problems:   Chronic obstructive pulmonary disease (HCC)   Acute respiratory failure (HCC)   Metabolic acidosis, NAG, bicarbonate losses   LOS: 3 days   A & P  Acute on chronic respiratory failure: Currently saturating at 90% on 3L O2. According to the patient this is her baseline oxygen requirement at home. Monitor.  COPD in exacerbation: The patient is requiring steroids, as needed albuterol nebulizer treatments.  Metabolic acidosis, NAG, due to bicarbonate losses: Resolved. Monitor.  SIRS with leukocytosis: SIRS resolved. Leukocytosis likely  due to steroids.   Acute on chronic combined systolic/diastolic heart failure: EF 35-40% with global hypokinesis and dilatation of the LV. The patient's volume status is a little low at this time. No further diuresis. Monitor volume status.  I have seen and examined this patient myself. I have spent 35 minutes in her evaluation and care.  DVT prophylaxis: Heparin Code Status: Full Code Family Communication: None available Disposition Plan: Home.  Mahealani Sulak, DO [facility] Direct contact: see [email or web address]  7PM-7AM contact night coverage as above 11/17/2018, 3:27 PM  LOS: 3 days

## 2018-11-18 DIAGNOSIS — E872 Acidosis: Secondary | ICD-10-CM | POA: Diagnosis not present

## 2018-11-18 DIAGNOSIS — J9601 Acute respiratory failure with hypoxia: Secondary | ICD-10-CM | POA: Diagnosis not present

## 2018-11-18 DIAGNOSIS — J449 Chronic obstructive pulmonary disease, unspecified: Secondary | ICD-10-CM | POA: Diagnosis not present

## 2018-11-18 LAB — BASIC METABOLIC PANEL
Anion gap: 7 (ref 5–15)
BUN: 38 mg/dL — ABNORMAL HIGH (ref 8–23)
CO2: 28 mmol/L (ref 22–32)
Calcium: 9 mg/dL (ref 8.9–10.3)
Chloride: 102 mmol/L (ref 98–111)
Creatinine, Ser: 1.19 mg/dL — ABNORMAL HIGH (ref 0.44–1.00)
GFR calc Af Amer: 56 mL/min — ABNORMAL LOW (ref >60)
GFR calc non Af Amer: 49 mL/min — ABNORMAL LOW (ref >60)
Glucose, Bld: 251 mg/dL — ABNORMAL HIGH (ref 70–99)
Potassium: 4.8 mmol/L (ref 3.5–5.1)
Sodium: 137 mmol/L (ref 135–145)

## 2018-11-18 LAB — CBC WITH DIFFERENTIAL/PLATELET
Abs Immature Granulocytes: 0.18 10*3/uL — ABNORMAL HIGH (ref 0.00–0.07)
Basophils Absolute: 0 10*3/uL (ref 0.0–0.1)
Basophils Relative: 0 %
Eosinophils Absolute: 0.1 10*3/uL (ref 0.0–0.5)
Eosinophils Relative: 1 %
HCT: 39.4 % (ref 36.0–46.0)
Hemoglobin: 12.3 g/dL (ref 12.0–15.0)
Immature Granulocytes: 2 %
Lymphocytes Relative: 11 %
Lymphs Abs: 1.2 10*3/uL (ref 0.7–4.0)
MCH: 29.4 pg (ref 26.0–34.0)
MCHC: 31.2 g/dL (ref 30.0–36.0)
MCV: 94.3 fL (ref 80.0–100.0)
Monocytes Absolute: 0.5 10*3/uL (ref 0.1–1.0)
Monocytes Relative: 5 %
Neutro Abs: 8.8 10*3/uL — ABNORMAL HIGH (ref 1.7–7.7)
Neutrophils Relative %: 81 %
Platelets: 189 10*3/uL (ref 150–400)
RBC: 4.18 MIL/uL (ref 3.87–5.11)
RDW: 15.1 % (ref 11.5–15.5)
WBC: 10.8 10*3/uL — ABNORMAL HIGH (ref 4.0–10.5)
nRBC: 0 % (ref 0.0–0.2)

## 2018-11-18 LAB — TRIGLYCERIDES: Triglycerides: 118 mg/dL (ref <150)

## 2018-11-18 LAB — GLUCOSE, CAPILLARY: Glucose-Capillary: 253 mg/dL — ABNORMAL HIGH (ref 70–99)

## 2018-11-18 LAB — MAGNESIUM: Magnesium: 2.2 mg/dL (ref 1.7–2.4)

## 2018-11-18 MED ORDER — DEXAMETHASONE 1 MG PO TABS: ORAL_TABLET | ORAL | 0 refills | Status: AC

## 2018-11-18 MED ORDER — FUROSEMIDE 40 MG PO TABS: 40.0000 mg | ORAL_TABLET | Freq: Two times a day (BID) | ORAL | 0 refills | Status: DC

## 2018-11-18 MED ORDER — POTASSIUM CHLORIDE ER 20 MEQ PO TBCR: 10.0000 meq | EXTENDED_RELEASE_TABLET | Freq: Every day | ORAL | 0 refills | Status: DC

## 2018-11-18 MED ORDER — PRO-STAT SUGAR FREE PO LIQD: 30.0000 mL | Freq: Two times a day (BID) | ORAL | 0 refills | Status: AC

## 2018-11-18 MED ORDER — DEXAMETHASONE 4 MG PO TABS: ORAL_TABLET | ORAL | 0 refills | Status: AC

## 2018-11-18 NOTE — Progress Notes (Addendum)
Patient discharged: Home with family  Via: Wheelchair   Discharge paperwork given:  to patient and family, verified with MD Swayze about steroids, all questions answered to the patient .   Reviewed with teach back  IV and telemetry disconnected  Belongings given to patient  Pt stated she has CPAP and all equipment at home, no SW or CM needs

## 2018-11-18 NOTE — Discharge Summary (Signed)
Physician Discharge Summary  Crystal Yates [id number] DOB: [birth year] DOA: 11/14/2018  PCP: Dortha Kern, PA  Admit date: 11/14/2018 Discharge date: 11/18/2018  Recommendations for Outpatient Follow-up:  1. Follow up with PCP in 7-10 days. 2. BMP on 11/26/2018 to be reported to PCP. 3. No smoking around patient.  Follow-up Information    Dortha Kern, [place].   Specialty: Physician Assistant Contact information: [place]. [address] [place] [address] [phone]          Discharge Diagnoses: Principal diagnosis is #1 1. Acute on chronic respiratory failure. Currently patient is saturating at 97% on her home O2 requirements 2L. 2. COPD in exacerbation 3. Metabolic acidosis 4. SIRS with leukocytosis 5. Chronic combined systolic/diastolic heart failure EF 35-40%.  Discharge Condition: Fair Disposition: Home  Diet recommendation: Heart Healthy  Filed Weights   11/16/18 0500 11/17/18 0343 11/18/18 0313  Weight: 104.4 kg 105.6 kg 106.4 kg    History of present illness:  63 yr old F w/ PMHx COPD on 2L O2 at home, HFrEF ( EF 40-45%), OSA, + smoker, CAD, IDDM, empty sella syndrome and Sjogren's seen in ED on 8/16 w/ SOB and was diuresed and discharged home.  Presents on 8/19 with increased work of breathing, hypoxia found to be in Acute on chronic resp failure w/ hypoxia and hypercarbia. Intubated started on mechanical ventilation PCCM consulted for admission.  Hospital Course:  The patient was intubated upon admission and she was admitted to the ICU on 11/14/2018. She was able to be extubated on 11/16/2018. She was transferred to the floor on 11/16/2018. Echocargram was obtained on 11/15/2018 that revealed EF of 35-40% with a mildly dilated left ventricular cavity. She was continued on nebulizer treatments and steroids. Her oxygen requirements were weaned down to her baseline O2 requirements. She will be discharged to home in fair condition  Today's  assessment: S: The patient is dressed and sitting up the edge of her bed. She has no new complaints. O: Vitals:  Vitals:   11/18/18 0743 11/18/18 0846  BP:  (!) 102/56  Pulse:  96  Resp:  18  Temp:  98 F (36.7 C)  SpO2: 98% 97%    Constitutional:  . The patient is awake, alert, and oriented x 3. No acute distress. Respiratory:  . No increased work of breathing.  . No wheezes, rales, or rhonchi. . No tactile fremitus. Cardiovascular:  . RRR, no m/r/g . No LE extremity edema   . Normal pedal pulses Abdomen:  . Abdomen is soft, non-tender, non-distended . No hernias, masses, or organomegaly . Normoactive bowel sounds.  Musculoskeletal:  . No cyanosis, clubbing, or edema Skin:  . No rashes, lesions, ulcers . palpation of skin: no induration or nodules Neurologic:  . CN 2-12 intact . Sensation all 4 extremities intact Psychiatric:  . judgement and insight appear normal . Mental status o Mood, affect appropriate o Orientation to person, place, time   Discharge Instructions  Discharge Instructions    Activity as tolerated - No restrictions   Complete by: As directed    Call MD for:  difficulty breathing, headache or visual disturbances   Complete by: As directed    Call MD for:  persistant dizziness or light-headedness   Complete by: As directed    Diet - low sodium heart healthy   Complete by: As directed    Discharge instructions   Complete by: As directed    The patient is to follow up with her PCP  in 7-10 days. She is to have a BMP drawn on 11/26/2018 and reported to PCP. No one is to smoke around the patient.   Increase activity slowly   Complete by: As directed      Allergies as of 11/18/2018      Reactions   Hydroxychloroquine Shortness Of Breath, Nausea Only, Other (See Comments)   Dizziness   Donepezil Other (See Comments)   Dizziness, depression, and makes the patient feel "funny"   Prednisone Other (See Comments)   Causes depression and suicidal  thoughts   Anticoagulant Cit Dext [acd Formula A] Other (See Comments)   Unknown   Bupropion Other (See Comments)   Suicidal thoughts   Metrizamide Other (See Comments)   (a non-ionic radiopaque contrast agent) "Blows the vein" and contrast gathers at the injected site's limb   Varenicline Other (See Comments)   Suicidal thoughts   Tape Rash, Other (See Comments)   Paper tape is preferred, PLEASE      Medication List    STOP taking these medications   metolazone 2.5 MG tablet Commonly known as: ZAROXOLYN   potassium chloride SA 20 MEQ tablet Commonly known as: K-DUR   Tiotropium Bromide-Olodaterol 2.5-2.5 MCG/ACT Aers Commonly known as: Stiolto Respimat     TAKE these medications   acetaminophen 500 MG tablet Commonly known as: TYLENOL Take 1 tablet (500 mg total) by mouth every 6 (six) hours as needed for mild pain.   albuterol 108 (90 Base) MCG/ACT inhaler Commonly known as: ProAir HFA Inhale 2 puffs into the lungs every 4-6 hours as needed for shortness of breath or wheezing What changed:   how much to take  how to take this  when to take this  reasons to take this  additional instructions   albuterol (2.5 MG/3ML) 0.083% nebulizer solution Commonly known as: PROVENTIL Take 3 mLs (2.5 mg total) by nebulization every 6 (six) hours as needed for wheezing or shortness of breath. What changed: Another medication with the same name was changed. Make sure you understand how and when to take each.   aspirin 81 MG EC tablet Take 1 tablet (81 mg total) by mouth daily.   atorvastatin 80 MG tablet Commonly known as: LIPITOR Take 80 mg by mouth every evening.   CALCIUM POLYCARBOPHIL PO Take 3 tablets by mouth daily.   clopidogrel 75 MG tablet Commonly known as: PLAVIX Take 1 tablet (75 mg total) by mouth daily. What changed: when to take this   dexamethasone 4 MG tablet Commonly known as: Decadron Take 1 tablet (4 mg total) by mouth daily for 5 days, THEN  0.5 tablets (2 mg total) daily for 5 days. Start taking on: November 18, 2018   dexamethasone 1 MG tablet Commonly known as: Decadron Take 1 tablet (1 mg total) by mouth daily for 5 days, THEN 0.5 tablets (0.5 mg total) daily for 5 days. Start taking on: November 28, 2018   DULoxetine 60 MG capsule Commonly known as: CYMBALTA Take 120 mg by mouth daily.   Emgality 120 MG/ML Soaj Generic drug: Galcanezumab-gnlm Take 120 mg by mouth every 30 (thirty) days.   feeding supplement (PRO-STAT SUGAR FREE 64) Liqd Take 30 mLs by mouth 2 (two) times daily.   furosemide 40 MG tablet Commonly known as: LASIX Take 1 tablet (40 mg total) by mouth 2 (two) times daily. What changed: how much to take   Fusion Plus Caps Take 1 capsule by mouth at bedtime.   hydrocortisone 5 MG tablet  Commonly known as: CORTEF Take 5-15 mg by mouth See admin instructions. Take 3 tablets in the morning and take 1 tablet in the evening   ipratropium-albuterol 0.5-2.5 (3) MG/3ML Soln Commonly known as: DUONEB Inhale 3 mLs into the lungs daily.   levothyroxine 150 MCG tablet Commonly known as: SYNTHROID Take 150 mcg by mouth daily before breakfast.   MAGnesium-Oxide 400 (241.3 Mg) MG tablet Generic drug: magnesium oxide Take 400 mg by mouth daily.   nitroGLYCERIN 0.4 MG SL tablet Commonly known as: NITROSTAT Place 1 tablet (0.4 mg total) under the tongue every 5 (five) minutes as needed for chest pain.   OXYGEN Place 2 L into the nose continuous.   polyethylene glycol 17 g packet Commonly known as: MIRALAX / GLYCOLAX Take 17 g by mouth daily. What changed:   when to take this  reasons to take this   Potassium Chloride ER 20 MEQ Tbcr Take 10 mEq by mouth daily.   pramipexole 1 MG tablet Commonly known as: MIRAPEX Take 1 mg by mouth 2 (two) times a day.   tiZANidine 4 MG tablet Commonly known as: ZANAFLEX Take 4 mg by mouth 2 (two) times daily as needed for muscle spasms.   Trelegy Ellipta  100-62.5-25 MCG/INH Aepb Generic drug: Fluticasone-Umeclidin-Vilant Inhale 1 puff into the lungs daily.   Tyler Aas FlexTouch 100 UNIT/ML Sopn FlexTouch Pen Generic drug: insulin degludec Inject 42 Units into the skin at bedtime.   Trulicity 1.5 0000000 Sopn Generic drug: Dulaglutide Inject 1.5 mg into the skin every Sunday.   Vitamin D3 50 MCG (2000 UT) capsule Take 2,000 Units by mouth daily.   zolpidem 5 MG tablet Commonly known as: AMBIEN Take 5 mg by mouth at bedtime as needed for sleep.      Allergies  Allergen Reactions  . Hydroxychloroquine Shortness Of Breath, Nausea Only and Other (See Comments)    Dizziness  . Donepezil Other (See Comments)    Dizziness, depression, and makes the patient feel "funny"  . Prednisone Other (See Comments)    Causes depression and suicidal thoughts  . Anticoagulant Cit Dext [Acd Formula A] Other (See Comments)    Unknown  . Bupropion Other (See Comments)    Suicidal thoughts  . Metrizamide Other (See Comments)    (a non-ionic radiopaque contrast agent) "Blows the vein" and contrast gathers at the injected site's limb  . Varenicline Other (See Comments)    Suicidal thoughts  . Tape Rash and Other (See Comments)    Paper tape is preferred, PLEASE    The results of significant diagnostics from this hospitalization (including imaging, microbiology, ancillary and laboratory) are listed below for reference.    Significant Diagnostic Studies: Dg Chest Port 1 View  Result Date: 11/17/2018 CLINICAL DATA:  Respiratory failure, extubated EXAM: PORTABLE CHEST 1 VIEW COMPARISON:  11/15/2018 FINDINGS: Patient has been extubated. NG tube also removed. Lower cervical fusion hardware noted. Persistent bibasilar atelectasis and left lower lobe retrocardiac collapse/consolidation. Small effusions not excluded layering posteriorly. No pneumothorax. Trachea is midline. Aorta atherosclerotic. IMPRESSION: Interval extubation. Residual bibasilar  atelectasis and persistent left lower lobe collapse/consolidation. No new finding. Electronically Signed   By: Jerilynn Mages.  Shick M.D.   On: 11/17/2018 09:56   Dg Chest Port 1 View  Result Date: 11/16/2018 CLINICAL DATA:  Respiratory failure. EXAM: PORTABLE CHEST 1 VIEW COMPARISON:  Radiograph November 14, 2018. FINDINGS: Stable cardiomegaly. Endotracheal and nasogastric tubes are unchanged in position. No pneumothorax is noted. Mild bibasilar subsegmental atelectasis is noted. Small  left pleural effusion is noted. Bony thorax is unremarkable. IMPRESSION: Stable support apparatus. Stable bibasilar opacities as described above. Electronically Signed   By: Marijo Conception M.D.   On: 11/16/2018 07:10   Dg Chest Port 1 View  Result Date: 11/15/2018 CLINICAL DATA:  Difficulty breathing EXAM: PORTABLE CHEST 1 VIEW COMPARISON:  11/11/2018 FINDINGS: Endotracheal tube with the tip 4.4 cm above the carina. Nasogastric tube coursing below the diaphragm. Bilateral mild interstitial thickening. No pleural effusion or pneumothorax. Stable cardiomegaly. No acute osseous abnormality. IMPRESSION: 1. Endotracheal tube with the tip 4.4 cm above the carina. 2. Cardiomegaly with mild pulmonary vascular congestion. Electronically Signed   By: Kathreen Devoid   On: 11/15/2018 09:14   Dg Chest Portable 1 View  Result Date: 11/11/2018 CLINICAL DATA:  63 year old female with chest pain EXAM: PORTABLE CHEST 1 VIEW COMPARISON:  Chest radiograph dated 07/30 FINDINGS: There are bibasilar atelectatic changes similar to prior radiograph. Trace left pleural effusion may be present. No lobar consolidation or pneumothorax. A linear radiopaque density over the right mid lung field may be external to the patient represent an area of atelectasis/scarring or trace fluid in the right minor fissure. Stable cardiac silhouette. Atherosclerotic calcification of the aortic arch. No acute osseous pathology. IMPRESSION: No significant interval change.  Electronically Signed   By: Anner Crete M.D.   On: 11/11/2018 03:12   Dg Chest Portable 1 View  Result Date: 10/26/2018 CLINICAL DATA:  Shortness of breath EXAM: PORTABLE CHEST 1 VIEW COMPARISON:  October 07, 2018 FINDINGS: The heart size and mediastinal contours are within normal limits. Mild atelectasis of left lung base is identified. There is no focal infiltrate, or pulmonary edema. There may be a small left pleural effusion. The visualized skeletal structures are stable. IMPRESSION: Mild atelectasis of left lung base. Possible small left pleural effusion. Electronically Signed   By: Abelardo Diesel M.D.   On: 10/26/2018 15:34    Microbiology: Recent Results (from the past 240 hour(s))  SARS Coronavirus 2 [facility] order, Performed in [facility] hospital lab) Nasopharyngeal Nasopharyngeal Swab     Status: None   Collection Time: 11/14/18  8:24 PM   Specimen: Nasopharyngeal Swab  Result Value Ref Range Status   SARS Coronavirus 2 NEGATIVE NEGATIVE Final    Comment: (NOTE) If result is NEGATIVE SARS-CoV-2 target nucleic acids are NOT DETECTED. The SARS-CoV-2 RNA is generally detectable in upper and lower  respiratory specimens during the acute phase of infection. The lowest  concentration of SARS-CoV-2 viral copies this assay can detect is 250  copies / mL. A negative result does not preclude SARS-CoV-2 infection  and should not be used as the sole basis for treatment or other  patient management decisions.  A negative result may occur with  improper specimen collection / handling, submission of specimen other  than nasopharyngeal swab, presence of viral mutation(s) within the  areas targeted by this assay, and inadequate number of viral copies  (<250 copies / mL). A negative result must be combined with clinical  observations, patient history, and epidemiological information. If result is POSITIVE SARS-CoV-2 target nucleic acids are DETECTED. The SARS-CoV-2 RNA is generally  detectable in upper and lower  respiratory specimens dur ing the acute phase of infection.  Positive  results are indicative of active infection with SARS-CoV-2.  Clinical  correlation with patient history and other diagnostic information is  necessary to determine patient infection status.  Positive results do  not rule out bacterial infection or co-infection  with other viruses. If result is PRESUMPTIVE POSTIVE SARS-CoV-2 nucleic acids MAY BE PRESENT.   A presumptive positive result was obtained on the submitted specimen  and confirmed on repeat testing.  While 2019 novel coronavirus  (SARS-CoV-2) nucleic acids may be present in the submitted sample  additional confirmatory testing may be necessary for epidemiological  and / or clinical management purposes  to differentiate between  SARS-CoV-2 and other Sarbecovirus currently known to infect humans.  If clinically indicated additional testing with an alternate test  methodology [phone]) is advised. The SARS-CoV-2 RNA is generally  detectable in upper and lower respiratory sp ecimens during the acute  phase of infection. The expected result is Negative. Fact Sheet for Patients:  StrictlyIdeas.no Fact Sheet for Healthcare Providers: BankingDealers.co.za This test is not yet approved or cleared by the [place] FDA and has been authorized for detection and/or diagnosis of SARS-CoV-2 by FDA under an Emergency Use Authorization (EUA).  This EUA will remain in effect (meaning this test can be used) for the duration of the COVID-19 declaration under Section 564(b)(1) of the Act, 21 U.S.C. section 360bbb-3(b)(1), unless the authorization is terminated or revoked sooner. Performed at [facility], [place] [address], [place], [place] [address]   Blood culture (routine x 2)     Status: None (Preliminary result)   Collection Time: 11/14/18  9:40 PM   Specimen: BLOOD  Result Value Ref  Range Status   Specimen Description BLOOD SITE NOT SPECIFIED  Final   Special Requests   Final    BOTTLES DRAWN AEROBIC AND ANAEROBIC Blood Culture results may not be optimal due to an inadequate volume of blood received in culture bottles   Culture   Final    NO GROWTH 4 DAYS Performed at [facility], [place] [address], [place], [place] [address]    Report Status PENDING  Incomplete  Blood culture (routine x 2)     Status: None (Preliminary result)   Collection Time: 11/14/18  9:40 PM   Specimen: BLOOD LEFT HAND  Result Value Ref Range Status   Specimen Description BLOOD LEFT HAND  Final   Special Requests   Final    BOTTLES DRAWN AEROBIC AND ANAEROBIC Blood Culture results may not be optimal due to an inadequate volume of blood received in culture bottles   Culture   Final    NO GROWTH 4 DAYS Performed at [facility], [place] [address], [place], [place] [address]    Report Status PENDING  Incomplete  Culture, respiratory (non-expectorated)     Status: None   Collection Time: 11/15/18 12:38 AM   Specimen: Tracheal Aspirate; Respiratory  Result Value Ref Range Status   Specimen Description TRACHEAL ASPIRATE  Final   Special Requests NONE  Final   Gram Stain   Final    FEW WBC PRESENT,BOTH PMN AND MONONUCLEAR FEW GRAM POSITIVE COCCI IN PAIRS RARE GRAM POSITIVE RODS    Culture   Final    RARE Consistent with normal respiratory flora. Performed at [facility], [place] [address] [place],  [address]    Report Status 11/17/2018 FINAL  Final  Urine culture     Status: None   Collection Time: 11/15/18  2:08 AM   Specimen: Urine, Random  Result Value Ref Range Status   Specimen Description URINE, RANDOM  Final   Special Requests NONE  Final   Culture   Final    NO GROWTH Performed at [facility], [place] [address], [place], [place] [address]    Report Status 11/16/2018 FINAL  Final  MRSA PCR Screening     Status: None   Collection Time: 11/15/18  7:37  AM   Specimen: Nasal Mucosa; Nasopharyngeal  Result Value Ref Range Status   MRSA by PCR NEGATIVE NEGATIVE Final    Comment:        The GeneXpert MRSA Assay (FDA approved for NASAL specimens only), is one component of a comprehensive MRSA colonization surveillance program. It is not intended to diagnose MRSA infection nor to guide or monitor treatment for MRSA infections. Performed at [facility], [place] [address] [place], [place] [address]      Labs: Basic Metabolic Panel: Recent Labs  Lab 11/14/18 2023  11/15/18 0036 11/15/18 [id number] 11/15/18 1049 11/15/18 1839 11/16/18 0251 11/16/18 1657 11/16/18 1805 11/17/18 0341 11/18/18 0517  NA 140   < > 141 141  --   --  137  --  139  --  137  K 4.0   < > 4.3 4.0  --   --  3.9  --  3.8  --  4.8  CL 100  --  102  --   --   --  103  --  103  --  102  CO2 26  --  29  --   --   --  24  --  26  --  28  GLUCOSE 341*  --  320*  --   --   --  336*  --  225*  --  251*  BUN 31*  --  34*  --   --   --  36*  --  38*  --  38*  CREATININE 1.32*  --  1.42*  --   --   --  1.22*  --  1.43*  --  1.19*  CALCIUM 10.0  --  9.4  --   --   --  8.8*  --  9.4  --  9.0  MG  --    < > 2.0  --  1.9 1.9 1.9 2.0  --  2.0 2.2  PHOS  --    < > 4.1  --  3.1 3.3 2.9 2.6  --  2.6  --    < > = values in this interval not displayed.   Liver Function Tests: Recent Labs  Lab 11/14/18 2023 11/15/18 0036 11/16/18 1805  AST 78* 103* 22  ALT 47* 73* 42  ALKPHOS 87 82 72  BILITOT 0.4 1.1 0.7  PROT 7.5 6.3* 6.3*  ALBUMIN 3.5 3.0* 2.7*   No results for input(s): LIPASE, AMYLASE in the last 168 hours. No results for input(s): AMMONIA in the last 168 hours. CBC: Recent Labs  Lab 11/14/18 2023  11/15/18 0036 11/15/18 0819 11/16/18 0251 11/16/18 1805 11/17/18 0341 11/18/18 0517  WBC 20.6*  --  19.6*  --  13.1* 14.8* 11.8* 10.8*  NEUTROABS 13.8*  --   --   --  11.7* 13.1* 9.0* 8.8*  HGB 14.7   < > 13.3 12.9 11.5* 13.0 11.4* 12.3  HCT 48.1*   < > 42.2  38.0 36.9 40.8 36.3 39.4  MCV 96.8  --  93.8  --  93.4 91.7 93.1 94.3  PLT 298  --  232  --  190 220 191 189   < > = values in this interval not displayed.   Cardiac Enzymes: Recent Labs  Lab 11/16/18  1805  CKTOTAL 33*   BNP: BNP (last 3 results) Recent Labs    10/26/18 1603 11/11/18 0247 11/14/18 2023  BNP 216.0* 253.4* 785.7*    ProBNP (last 3 results) Recent Labs    03/15/18 1025 10/03/18 1108 10/18/18 0941  PROBNP 858* 1,049* 1,150*    CBG: Recent Labs  Lab 11/17/18 0620 11/17/18 1126 11/17/18 1632 11/17/18 2104 11/18/18 0604  GLUCAP 278* 316* 232* 298* 253*    Active Problems:   Chronic obstructive pulmonary disease (HCC)   Acute respiratory failure (HCC)   Metabolic acidosis, NAG, bicarbonate losses   Time coordinating discharge: 38 minutes.  Signed:        Navdeep Fessenden, DO [facility]  11/18/2018, 3:52 PM

## 2018-11-19 ENCOUNTER — Ambulatory Visit: Payer: Federal, State, Local not specified - PPO | Admitting: Internal Medicine

## 2018-11-19 ENCOUNTER — Ambulatory Visit: Payer: Federal, State, Local not specified - PPO | Admitting: Cardiology

## 2018-11-19 LAB — CULTURE, BLOOD (ROUTINE X 2)
Culture: NO GROWTH
Culture: NO GROWTH

## 2018-11-20 DIAGNOSIS — G4733 Obstructive sleep apnea (adult) (pediatric): Secondary | ICD-10-CM | POA: Diagnosis not present

## 2018-11-21 NOTE — Progress Notes (Addendum)
Office Visit    Patient Name: Crystal Yates Date of Encounter: 11/22/2018  Primary Care Provider:  Dortha Yates, [place] Primary Cardiologist:  Crystal Campus, MD  Chief Complaint    63 yo female with PMH ischemic cardiomyopathy EF 40-45%, HTN, chronic systolic and diastolic congestive heart failure, COPD on 2L, CAD s/p stenting presents for follow up after recent hospitalization.   Past Medical History    Past Medical History:  Diagnosis Date  . Acute on chronic systolic heart failure exacerbation(HCC) 04/08/2016  . Arthritis   . CAD in native artery    a. Prior LAD stenting based on cath. b. RCA stenting 03/2016 x2.  . Chronic combined systolic and diastolic CHF (congestive heart failure) ([place])   . CKD (chronic kidney disease), stage II   . COPD (chronic obstructive pulmonary disease) ([place])   . Diabetes mellitus without complication ([place])   . Hashimoto's thyroiditis   . Hyperlipidemia   . Hypertension   . On home oxygen therapy    "2L; 24/7" (10/23/2017)  . Secondary adrenal insufficiency ([place])   . Thyroid disease   . Tobacco abuse    Past Surgical History:  Procedure Laterality Date  . ABDOMINAL SURGERY    . CESAREAN SECTION    . CHOLECYSTECTOMY    . COLON RESECTION    . COLONOSCOPY WITH PROPOFOL N/A 01/16/2018   Procedure: COLONOSCOPY WITH PROPOFOL;  Surgeon: Rush Landmark Telford Nab., MD;  Location: [facility];  Service: Gastroenterology;  Laterality: N/A;  . CORONARY BALLOON ANGIOPLASTY N/A 10/13/2017   Procedure: CORONARY BALLOON ANGIOPLASTY;  Surgeon: Belva Crome, MD;  Location: [facility];  Service: Cardiovascular;  Laterality: N/A;  . HERNIA MESH REMOVAL    . HERNIA REPAIR    . LEFT HEART CATH AND CORONARY ANGIOGRAPHY N/A 10/13/2016   Procedure: Left Heart Cath and Coronary Angiography;  Surgeon: Nelva Bush, MD;  Location: [facility];  Service: Cardiovascular;  Laterality: N/A;  . POLYPECTOMY  01/16/2018   Procedure:  POLYPECTOMY;  Surgeon: Rush Landmark Telford Nab., MD;  Location: [facility];  Service: Gastroenterology;;  . RIGHT/LEFT HEART CATH AND CORONARY ANGIOGRAPHY N/A 10/13/2017   Procedure: RIGHT/LEFT HEART CATH AND CORONARY ANGIOGRAPHY;  Surgeon: Belva Crome, MD;  Location: [facility];  Service: Cardiovascular;  Laterality: N/A;  . SHOULDER ARTHROSCOPY    . TUBAL LIGATION      Allergies  Allergies  Allergen Reactions  . Hydroxychloroquine Shortness Of Breath, Nausea Only and Other (See Comments)    Dizziness  . Donepezil Other (See Comments)    Dizziness, depression, and makes the patient feel "funny"  . Prednisone Other (See Comments)    Causes depression and suicidal thoughts  . Anticoagulant Cit Dext [Acd Formula A] Other (See Comments)    Unknown  . Bupropion Other (See Comments)    Suicidal thoughts  . Metrizamide Other (See Comments)    (a non-ionic radiopaque contrast agent) "Blows the vein" and contrast gathers at the injected site's limb  . Varenicline Other (See Comments)    Suicidal thoughts  . Tape Rash and Other (See Comments)    Paper tape is preferred, PLEASE    History of Present Illness    Crystal Yates is a 63 y.o. female with a hx of ischemic cardiomyopathy EF 40-45%, HTN, chronic systolic and diastolic congestive heart failure, COPD on 2L, CAD s/p stent, adrenal insufficiency on chronic Cortef, DM2 on long term insulin, CKD3, hypothyroidism, tobacco abuse. Last seen by  Dr. [name] 10/03/18. She was subsequently in the ED 10/07/18, 10/26/18, and 11/11/18 for dyspnea. Multiple telephone encounters; her Lasix was changed to Metolazone for reported LE edema then back to Lasix by her PCP after the patient did not feel Metolazone was working.   She was admitted to [facility] 11/14/18 - 11/18/18 for acute on chronic respiratory failure, COPD exacerbation, metabolic acidosis, SIRS, and chronic combined systolic/diastolic HF. After presenting tot he ED she was  intubated, admitted to ICU. Echo 11/15/18 EF 35-40% mildly dilated LV, pseudonormal LV diastolic parameters, diffus LV hypokinesis. She was discharged on Lasix 40mg  BID, dexamethasone, inhalers.   Presents today with her husband.  She is somewhat frustrated by medication changes that were made via telephone encounters to her diuretics and reports she does not want to take metolazone again.  Reassured her that we will continue her present Lasix dose.  Weight, LE edema stable since discharge.  Checks her weight daily.  Tells me she is still somewhat short of breath, but no more than usual.  Checks her blood pressure intermittently at home and tells me this routinely 0000000 to [id number] systolic, but denies dizziness lightheadedness.  Tells me her blood pressure has been low for a number of years but asymptomatic.  We discussed the etiology of shortness of breath as COPD or heart failure.  We discussed symptoms to look for to be concerned for heart failure such as weight gain and lower extremity edema.  She continues to wear her 2 L of oxygen.  Tells me she gets intermittent chest pain.  Occurs about 1 time a week.  Occurs at rest and with activity.  Described as midsternal aching that does not radiate.  Lasts up to 30 minutes.  Reports it is less frequent than previous and she has discussed with Dr. [name] past.  She verbalizes understanding of when to take nitroglycerin, has taken once over the last month, and has plenty at home.  Reviewed her lab results from the hospital in detail.  She uses my chart to monitor her testing results.  She was concerned regarding the potassium result of 4.8 that showed "sample hemolyzed this "we discussed that this means that the sample had started to break down and we would recheck her potassium today but result not of concern.  Discussed her echocardiogram in detail.  She verbalized understanding that her EF had decreased from 40 to 45% to now 35 to 45%.  We discussed initiation  of guideline directed therapy by initiation of Entresto.  She does not take an ACE nor ARB.  She was on Entresto previously for reduced EF and reports she tolerated it well.  From previous review of records it looks like her dose was 49/51 mg twice daily we will plan to start on low-dose due to borderline low blood pressure and monitor kidney function carefully.   EKGs/Labs/Other Studies Reviewed:   The following studies were reviewed today: Echo 11/15/18 1. The left ventricle has moderate-severely reduced systolic function, with an ejection fraction of 35-40%. The cavity size was mildly dilated. Left ventricular diastolic Doppler parameters are consistent with pseudonormalization. Elevated left  ventricular end-diastolic pressure Left ventricular diffuse hypokinesis There is severe hypokinesis to akinesis of the mid and basal inferior and inferolateral walls.  2. The right ventricle has normal systolic function. The cavity was normal. There is no increase in right ventricular wall thickness. Right ventricular systolic pressure could not be assessed.  3. The aorta is normal unless otherwise noted.  4.  Left atrial size was mildly dilated.  5. The interatrial septum appears to be lipomatous.  LHC/RHC 09/2017  Normal left main.  Proximal to mid LAD stent with diffuse 30% restenosis.  Proximal circumflex with an eccentric 50 to 65% stenosis depending upon review..  Progression at this site is noted when compared to one year ago when this region contained 30% narrowing.  FFR was not possible due to COPD and intolerance of adenosine.  Dominant right coronary with proximal to distal stenting and overlap fashion, possibly with some region of stent within stent.  Distally there is an eccentric 70 to 75% stenosis that is clearly progressed when compared to one year ago when there was less than 50% narrowing.  Mild pulmonary hypertension with mean PA pressure of 30 mmHg.  Reduced LV systolic function  with regional wall motion abnormality including severe hypokinesis of the inferobasal wall.  Estimated EF 40 to 45%.  LVEDP is 20 mmHg.  Mean pulmonary capillary wedge pressure is 20 mmHg.  Successful balloon angioplasty of distal right coronary ISR reducing from 75% to 0% with TIMI grade III flow using a 3.5 mm [place] balloon x2 inflations.   RECOMMENDATIONS:  Dual antiplatelet therapy indefinitely  Risk factor modification.  We discussed smoking cessation.  LDL less than 70.  Bivalirudin is discontinued and sheath will be removed later this afternoon.  Monitor closely for bleeding/access site hematoma.  If stable in a.m. without access site complication, she will be eligible for discharge.  Consider circumflex PCI if persistent symptoms/angina.  Recommend to resume Aspirin and Plavix, at currently prescribed dose and frequency, on To be continued indefinitely..  Indefinite  EKG: No EKG today.  Recent Labs: 08/06/2018: TSH 1.316 10/18/2018: NT-Pro BNP 1,150 11/14/2018: B Natriuretic Peptide 785.7 11/16/2018: ALT 42 11/18/2018: BUN 38; Creatinine, Ser 1.19; Hemoglobin 12.3; Magnesium 2.2; Platelets 189; Potassium 4.8; Sodium 137  Recent Lipid Panel    Component Value Date/Time   CHOL 122 10/13/2016 0037   TRIG 118 11/18/2018 0517   HDL 34 (L) 10/13/2016 0037   CHOLHDL 3.6 10/13/2016 0037   VLDL 14 10/13/2016 0037   LDLCALC 74 10/13/2016 0037    Home Medications   Current Meds  Medication Sig  . acetaminophen (TYLENOL) 500 MG tablet Take 1 tablet (500 mg total) by mouth every 6 (six) hours as needed for mild pain.  Marland Kitchen albuterol (PROAIR HFA) 108 (90 Base) MCG/ACT inhaler Inhale 2 puffs into the lungs every 4-6 hours as needed for shortness of breath or wheezing (Patient taking differently: Inhale 2 puffs into the lungs every 4 (four) hours as needed for wheezing or shortness of breath. )  . albuterol (PROVENTIL) (2.5 MG/3ML) 0.083% nebulizer solution Take 3 mLs (2.5 mg total) by  nebulization every 6 (six) hours as needed for wheezing or shortness of breath.  . Amino Acids-Protein Hydrolys (FEEDING SUPPLEMENT, PRO-STAT SUGAR FREE 64,) LIQD Take 30 mLs by mouth 2 (two) times daily.  Marland Kitchen aspirin EC 81 MG EC tablet Take 1 tablet (81 mg total) by mouth daily.  Marland Kitchen atorvastatin (LIPITOR) 80 MG tablet Take 80 mg by mouth every evening.   Marland Kitchen CALCIUM POLYCARBOPHIL PO Take 3 tablets by mouth daily.  . Cholecalciferol (VITAMIN D3) 2000 units capsule Take 2,000 Units by mouth daily.   . clopidogrel (PLAVIX) 75 MG tablet Take 1 tablet (75 mg total) by mouth daily. (Patient taking differently: Take 75 mg by mouth at bedtime. )  . [START ON 11/28/2018] dexamethasone (DECADRON) 1 MG tablet Take  1 tablet (1 mg total) by mouth daily for 5 days, THEN 0.5 tablets (0.5 mg total) daily for 5 days.  Marland Kitchen dexamethasone (DECADRON) 4 MG tablet Take 1 tablet (4 mg total) by mouth daily for 5 days, THEN 0.5 tablets (2 mg total) daily for 5 days.  . DULoxetine (CYMBALTA) 60 MG capsule Take 120 mg by mouth daily.   Marland Kitchen EMGALITY 120 MG/ML SOAJ Take 120 mg by mouth every 30 (thirty) days.  . furosemide (LASIX) 40 MG tablet Take 1 tablet (40 mg total) by mouth 2 (two) times daily. Make take one additional tablet for weight gain of 3 lbs overnight or 5 lbs in one week.  . hydrocortisone (CORTEF) 5 MG tablet Take 5-15 mg by mouth See admin instructions. Take 3 tablets in the morning and take 1 tablet in the evening  . insulin degludec (TRESIBA FLEXTOUCH) 100 UNIT/ML SOPN FlexTouch Pen Inject 42 Units into the skin at bedtime.   Marland Kitchen ipratropium-albuterol (DUONEB) 0.5-2.5 (3) MG/3ML SOLN Inhale 3 mLs into the lungs daily.  . Iron-FA-B Cmp-C-Biot-Probiotic (FUSION PLUS) CAPS Take 1 capsule by mouth at bedtime.   Marland Kitchen levothyroxine (SYNTHROID, LEVOTHROID) 150 MCG tablet Take 150 mcg by mouth daily before breakfast.  . MAGNESIUM-OXIDE 400 (241.3 Mg) MG tablet Take 400 mg by mouth daily.  . nitroGLYCERIN (NITROSTAT) 0.4 MG SL  tablet Place 1 tablet (0.4 mg total) under the tongue every 5 (five) minutes as needed for chest pain.  . OXYGEN Place 2 L into the nose continuous.   . polyethylene glycol (MIRALAX / GLYCOLAX) packet Take 17 g by mouth daily. (Patient taking differently: Take 17 g by mouth daily as needed for mild constipation. )  . potassium chloride 20 MEQ TBCR Take 10 mEq by mouth daily.  . pramipexole (MIRAPEX) 1 MG tablet Take 1 mg by mouth 2 (two) times a day.  . sacubitril-valsartan (ENTRESTO) 24-26 MG Take 1 tablet by mouth 2 (two) times daily.  Marland Kitchen tiZANidine (ZANAFLEX) 4 MG tablet Take 4 mg by mouth 2 (two) times daily as needed for muscle spasms.  . TRELEGY ELLIPTA 100-62.5-25 MCG/INH AEPB Inhale 1 puff into the lungs daily.  . TRULICITY 1.5 0000000 SOPN Inject 1.5 mg into the skin every Sunday.   . zolpidem (AMBIEN) 5 MG tablet Take 5 mg by mouth at bedtime as needed for sleep.      Review of Systems    Review of Systems  Constitution: Negative for chills, fever and malaise/fatigue.  Cardiovascular: Positive for chest pain (30 minute episode today, self resolved), dyspnea on exertion and leg swelling (at baseline). Negative for near-syncope, orthopnea, palpitations and paroxysmal nocturnal dyspnea.  Respiratory: Positive for shortness of breath. Negative for cough and wheezing.   Gastrointestinal: Negative for nausea and vomiting.  Neurological: Negative for dizziness, light-headedness and weakness.   All other systems reviewed and are otherwise negative except as noted above.  Physical Exam    VS:  BP 124/76 (BP Location: Left Arm, Patient Position: Sitting, Cuff Size: Large)   Pulse 87   Ht 5\' 3"  (1.6 m)   Wt 236 lb (107 kg)   SpO2 97%   BMI 41.81 kg/m  , BMI Body mass index is 41.81 kg/m. GEN: Well nourished, well developed, in no acute distress. HEENT: normal. Neck: Supple, no JVD, carotid bruits, or masses. Cardiac: RRR, no murmurs, rubs, or gallops. No clubbing, cyanosis.  Nonpitting edema bilateral LE, compression stockings on.  Radials/DP/PT 2+ and equal bilaterally.  Respiratory:  Respirations  regular and unlabored, clear to auscultation bilaterally. GI: Soft, nontender, nondistended, BS + x 4. MS: No deformity or atrophy. Skin: Warm and dry, no rash. Scattered ecchymosis to upper extremities.  Neuro:  Strength and sensation are intact. Psych: Normal affect.   Assessment & Plan    1. Chronic combined systolic and diastolic heart failure - Echo 11/15/18 EF 35-40%, pseudonormal LV diastolic dysfunction.  NYHA II symptoms. Difficult to determine if fatigue and SOB are of cardiac or pulmonary origin.  Nonpitting bilateral lower extremity edema, weight stable from hospital discharge.  No beta-blocker secondary to COPD.    GDMT: Initiate Entresto 24/26 mg twice daily today, follow-up with office visit and BMP in 4 weeks  Consider addition of low-dose MRI at next office visit.   Monitor kidney function in setting of CKD 3.   She may take extra Lasix tablet for weight gain of 3lbs overnight or 5 lbs in 1 week. Continue daily weights.   2. Ischemic cardiomyopathy - Plan as above. Current stable anginal symptoms, as below.  If symptoms not improved with addition of Entresto and guideline directed therapy consider repeat ischemic evaluation.  3. CAD with stable angina- LHC/RHC 09/2017 showed prox-mid LAD stent 30% diffuse restenosis, prox Cx with eccentric 50-65% stenosis, distal RCA eccentric 70-75% stenosis - balloon angioplasty reduced to 0%, mild pulmonary HTN. Consider Cx PCI if persistent angina. Indefinite DAPT with aspirin and Plavix. GDMT aspirin, statin, PRN nitroglycerin- no beta blocker secondary to COPD.  Reports episode of chest pain once per week, described as ache, lasts up to 30 minutes, has not taken nitroglycerin once over the past month.  Would defer ischemic evaluation at this time as the majority of her symptoms appear to be due to her reduced  EF/COPD.  4. HTN -BP stable today on no antihypertensives.  Checks her home blood pressure intermittently withsystolic blood pressure 0000000 to 110s with no dizziness, lightheadedness. Add Entresto, as above.   5. COPD - Recent admission for COPD exacerbation. On home 2L oxygen.  Lung sounds clear on exam, continue to follow with pulmonology.  6. Tobacco abuse -Encouraged her current cessation efforts.  Disposition: Follow up in 4 weeks with labs after initiation of Entresto.   Loel Dubonnet, NP 11/22/2018, 3:57 PM

## 2018-11-22 ENCOUNTER — Ambulatory Visit (INDEPENDENT_AMBULATORY_CARE_PROVIDER_SITE_OTHER): Payer: Federal, State, Local not specified - PPO | Admitting: Family

## 2018-11-22 ENCOUNTER — Other Ambulatory Visit: Payer: Self-pay

## 2018-11-22 ENCOUNTER — Encounter: Payer: Self-pay | Admitting: Family

## 2018-11-22 VITALS — BP 124/76 | HR 87 | Ht 63.0 in | Wt 236.0 lb

## 2018-11-22 DIAGNOSIS — I25118 Atherosclerotic heart disease of native coronary artery with other forms of angina pectoris: Secondary | ICD-10-CM | POA: Diagnosis not present

## 2018-11-22 DIAGNOSIS — E1122 Type 2 diabetes mellitus with diabetic chronic kidney disease: Secondary | ICD-10-CM | POA: Diagnosis not present

## 2018-11-22 DIAGNOSIS — J449 Chronic obstructive pulmonary disease, unspecified: Secondary | ICD-10-CM | POA: Diagnosis not present

## 2018-11-22 DIAGNOSIS — I5042 Chronic combined systolic (congestive) and diastolic (congestive) heart failure: Secondary | ICD-10-CM | POA: Diagnosis not present

## 2018-11-22 DIAGNOSIS — Z794 Long term (current) use of insulin: Secondary | ICD-10-CM | POA: Diagnosis not present

## 2018-11-22 DIAGNOSIS — I10 Essential (primary) hypertension: Secondary | ICD-10-CM

## 2018-11-22 DIAGNOSIS — I255 Ischemic cardiomyopathy: Secondary | ICD-10-CM

## 2018-11-22 MED ORDER — FUROSEMIDE 40 MG PO TABS: 40.0000 mg | ORAL_TABLET | Freq: Two times a day (BID) | ORAL | 0 refills | Status: AC

## 2018-11-22 MED ORDER — ENTRESTO 24-26 MG PO TABS: 1.0000 | ORAL_TABLET | Freq: Two times a day (BID) | ORAL | 0 refills | Status: DC

## 2018-11-22 NOTE — Patient Instructions (Signed)
Medication Instructions:  Your physician has recommended you make the following change in your medication:  START Entresto 24/26mg  one tablet twice daily  If you need a refill on your cardiac medications before your next appointment, please call your pharmacy.   Lab work: Your physician recommends that you return for lab work today: CBC, CMET, lipid profile  If you have labs (blood work) drawn today and your tests are completely normal, you will receive your results only by: Marland Kitchen MyChart Message (if you have MyChart) OR . A paper copy in the mail If you have any lab test that is abnormal or we need to change your treatment, we will call you to review the results.  Testing/Procedures: No testing ordered today.  Follow-Up: At [facility], you and your health needs are our priority.  As part of our continuing mission to provide you with exceptional heart care, we have created designated Provider Care Teams.  These Care Teams include your primary Cardiologist (physician) and Advanced Practice Providers (APPs -  Physician Assistants and Nurse Practitioners) who all work together to provide you with the care you need, when you need it. You will need a follow up appointment in 4 weeks.  You may see Jenne Campus, MD or another member of our [facility] Provider Team in [place] or [place].  .   Any Other Special Instructions Will Be Listed Below (If Applicable).   Check weight daily.  If weight gain of 3lbs overnight or 5lbs in 1 week - take an additional Lasix tablet.   Monitor blood pressure and symptoms - report dizziness, light headedness.   Sacubitril; Valsartan oral tablet What is this medicine? SACUBITRIL; VALSARTAN (sak UE bi tril; val SAR tan) is a combination of 2 drugs used to reduce the risk of death and hospitalizations in people with long-lasting heart failure. It is usually used with other medicines to treat heart failure. This medicine may be used for other  purposes; ask your health care provider or pharmacist if you have questions. COMMON BRAND NAME(S): Entresto What should I tell my health care provider before I take this medicine? They need to know if you have any of these conditions:  diabetes and take a medicine that contains aliskiren  kidney disease  liver disease  an unusual or allergic reaction to sacubitril; valsartan, drugs called angiotensin converting enzyme (ACE) inhibitors, angiotensin II receptor blockers (ARBs), other medicines, foods, dyes, or preservatives  pregnant or trying to get pregnant  breast-feeding How should I use this medicine? Take this medicine by mouth with a glass of water. Follow the directions on the prescription label. You can take it with or without food. If it upsets your stomach, take it with food. Take your medicine at regular intervals. Do not take it more often than directed. Do not stop taking except on your doctor's advice. Do not take this medicine for at least 36 hours before or after you take an ACE inhibitor medicine. Talk to your health care provider if you are not sure if you take an ACE inhibitor. Talk to your pediatrician regarding the use of this medicine in children. Special care may be needed. Overdosage: If you think you have taken too much of this medicine contact a poison control center or emergency room at once. NOTE: This medicine is only for you. Do not share this medicine with others. What if I miss a dose? If you miss a dose, take it as soon as you can. If it is  almost time for next dose, take only that dose. Do not take double or extra doses. What may interact with this medicine? Do not take this medicine with any of the following medicines:  aliskiren if you have diabetes  angiotensin-converting enzyme (ACE) inhibitors, like benazepril, captopril, enalapril, fosinopril, lisinopril, or ramipril This medicine may also interact with the following medicines:  angiotensin II  receptor blockers (ARBs) like azilsartan, candesartan, eprosartan, irbesartan, losartan, olmesartan, telmisartan, or valsartan  lithium  NSAIDS, medicines for pain and inflammation, like ibuprofen or naproxen  potassium-sparing diuretics like amiloride, spironolactone, and triamterene  potassium supplements This list may not describe all possible interactions. Give your health care provider a list of all the medicines, herbs, non-prescription drugs, or dietary supplements you use. Also tell them if you smoke, drink alcohol, or use illegal drugs. Some items may interact with your medicine. What should I watch for while using this medicine? Tell your doctor or healthcare professional if your symptoms do not start to get better or if they get worse. Do not become pregnant while taking this medicine. Women should inform their doctor if they wish to become pregnant or think they might be pregnant. There is a potential for serious side effects to an unborn child. Talk to your health care professional or pharmacist for more information. You may get dizzy. Do not drive, use machinery, or do anything that needs mental alertness until you know how this medicine affects you. Do not stand or sit up quickly, especially if you are an older patient. This reduces the risk of dizzy or fainting spells. Avoid alcoholic drinks; they can make you more dizzy. What side effects may I notice from receiving this medicine? Side effects that you should report to your doctor or health care professional as soon as possible:  allergic reactions like skin rash, itching or hives, swelling of the face, lips, or tongue  signs and symptoms of increased potassium like muscle weakness; chest pain; or fast, irregular heartbeat  signs and symptoms of kidney injury like trouble passing urine or change in the amount of urine  signs and symptoms of low blood pressure like feeling dizzy or lightheaded, or if you develop extreme  fatigue Side effects that usually do not require medical attention (report to your doctor or health care professional if they continue or are bothersome):  cough This list may not describe all possible side effects. Call your doctor for medical advice about side effects. You may report side effects to FDA at 1-800-FDA-1088. Where should I keep my medicine? Keep out of the reach of children. Store at room temperature between 15 and 30 degrees C (59 and 86 degrees F). Throw away any unused medicine after the expiration date. NOTE: This sheet is a summary. It may not cover all possible information. If you have questions about this medicine, talk to your doctor, pharmacist, or health care provider.  2020 Elsevier/Gold Standard (2015-04-29 13:54:19)

## 2018-11-23 ENCOUNTER — Telehealth: Payer: Self-pay | Admitting: Family

## 2018-11-23 ENCOUNTER — Encounter: Payer: Self-pay | Admitting: Gastroenterology

## 2018-11-23 ENCOUNTER — Ambulatory Visit (INDEPENDENT_AMBULATORY_CARE_PROVIDER_SITE_OTHER): Payer: Federal, State, Local not specified - PPO | Admitting: Gastroenterology

## 2018-11-23 VITALS — BP 122/68 | HR 82 | Temp 97.5°F | Ht 62.0 in | Wt 235.0 lb

## 2018-11-23 DIAGNOSIS — K581 Irritable bowel syndrome with constipation: Secondary | ICD-10-CM | POA: Diagnosis not present

## 2018-11-23 DIAGNOSIS — K219 Gastro-esophageal reflux disease without esophagitis: Secondary | ICD-10-CM

## 2018-11-23 LAB — CBC
Hematocrit: 38.6 % (ref 34.0–46.6)
Hemoglobin: 13.1 g/dL (ref 11.1–15.9)
MCH: 29.5 pg (ref 26.6–33.0)
MCHC: 33.9 g/dL (ref 31.5–35.7)
MCV: 87 fL (ref 79–97)
Platelets: 206 10*3/uL (ref 150–450)
RBC: 4.44 x10E6/uL (ref 3.77–5.28)
RDW: 14.1 % (ref 11.7–15.4)
WBC: 17.5 10*3/uL — ABNORMAL HIGH (ref 3.4–10.8)

## 2018-11-23 LAB — COMPREHENSIVE METABOLIC PANEL
ALT: 23 IU/L (ref 0–32)
AST: 14 IU/L (ref 0–40)
Albumin/Globulin Ratio: 1.5 (ref 1.2–2.2)
Albumin: 3.8 g/dL (ref 3.8–4.8)
Alkaline Phosphatase: 94 IU/L (ref 39–117)
BUN/Creatinine Ratio: 24 (ref 12–28)
BUN: 29 mg/dL — ABNORMAL HIGH (ref 8–27)
Bilirubin Total: 0.2 mg/dL (ref 0.0–1.2)
CO2: 27 mmol/L (ref 20–29)
Calcium: 10 mg/dL (ref 8.7–10.3)
Chloride: 97 mmol/L (ref 96–106)
Creatinine, Ser: 1.19 mg/dL — ABNORMAL HIGH (ref 0.57–1.00)
GFR calc Af Amer: 56 mL/min/{1.73_m2} — ABNORMAL LOW (ref >59)
GFR calc non Af Amer: 49 mL/min/{1.73_m2} — ABNORMAL LOW (ref >59)
Globulin, Total: 2.6 g/dL (ref 1.5–4.5)
Glucose: 160 mg/dL — ABNORMAL HIGH (ref 65–99)
Potassium: 4.7 mmol/L (ref 3.5–5.2)
Sodium: 140 mmol/L (ref 134–144)
Total Protein: 6.4 g/dL (ref 6.0–8.5)

## 2018-11-23 LAB — LIPID PANEL
Chol/HDL Ratio: 3.1 ratio (ref 0.0–4.4)
Cholesterol, Total: 190 mg/dL (ref 100–199)
HDL: 62 mg/dL (ref >39)
LDL Calculated: 102 mg/dL — ABNORMAL HIGH (ref 0–99)
Triglycerides: 131 mg/dL (ref 0–149)
VLDL Cholesterol Cal: 26 mg/dL (ref 5–40)

## 2018-11-23 NOTE — Patient Instructions (Signed)
If you are age 63 or older, your body mass index should be between 23-30. Your Body mass index is 42.98 kg/m. If this is out of the aforementioned range listed, please consider follow up with your Primary Care Provider.  If you are age 62 or younger, your body mass index should be between 19-25. Your Body mass index is 42.98 kg/m. If this is out of the aformentioned range listed, please consider follow up with your Primary Care Provider.   Thank you,  Dr. Jackquline Denmark

## 2018-11-23 NOTE — Progress Notes (Signed)
Chief Complaint:   Referring Provider:  Dortha Kern, PA      ASSESSMENT AND PLAN;   #1. Rectal bleeding d/t hoids (resolved). Neg colon 12/2017 except for small colonic polyps, internal and external hemorrhoids.  Recommended to repeat in 3 years.  #2. Ventral recurrent incisional hernia (Had H/O PSBO 2019).  Not a surgical candidate for elective procedure.  Surgery only if it is emergency and absolutely needed.  Plan: -Fiber supplemts 4 gummies/day -Increase water intake. -Continue using MiraLAX on as-needed basis.  If there is no bowel movement for 3 to 4 days as she has been doing currently. -Discussed extensively with the patient patient's husband. -Advised to quit smoking.    HPI:    Crystal Yates is a 63 y.o. female  With rectal bleeding d/t internal hemorrhoids.  Colonoscopy as above.  It has resolved ever since she has been taking Gummies.  Having pulmonary problems  Had resp arrest recently requiring hospital to [place] to smoke but has cut down significantly.  COVID-19 was negative.  Continues to have shortness of breath.  From the GI standpoint, doing much better. No nausea, vomiting, heartburn, regurgitation, odynophagia or dysphagia.  No significant diarrhea or constipation.  There is no melena or hematochezia. No unintentional weight loss.  Past Medical History:  Diagnosis Date  . Acute on chronic systolic heart failure exacerbation(HCC) 04/08/2016  . Arthritis   . CAD in native artery    a. Prior LAD stenting based on cath. b. RCA stenting 03/2016 x2.  . Chronic combined systolic and diastolic CHF (congestive heart failure) ([place])   . CKD (chronic kidney disease), stage II   . COPD (chronic obstructive pulmonary disease) ([place])   . Diabetes mellitus without complication ([place])   . Hashimoto's thyroiditis   . History of blood transfusion   . Hyperlipidemia   . Hypertension   . On home oxygen therapy    "2L; 24/7"  (10/23/2017)  . Secondary adrenal insufficiency ([place])   . Thyroid disease   . Tobacco abuse     Past Surgical History:  Procedure Laterality Date  . ABDOMINAL SURGERY    . CESAREAN SECTION    . CHOLECYSTECTOMY    . COLON RESECTION    . COLONOSCOPY WITH PROPOFOL N/A 01/16/2018   Procedure: COLONOSCOPY WITH PROPOFOL;  Surgeon: Rush Landmark Telford Nab., MD;  Location: [facility];  Service: Gastroenterology;  Laterality: N/A;  . CORONARY BALLOON ANGIOPLASTY N/A 10/13/2017   Procedure: CORONARY BALLOON ANGIOPLASTY;  Surgeon: Belva Crome, MD;  Location: [facility];  Service: Cardiovascular;  Laterality: N/A;  . HERNIA MESH REMOVAL    . HERNIA REPAIR    . LEFT HEART CATH AND CORONARY ANGIOGRAPHY N/A 10/13/2016   Procedure: Left Heart Cath and Coronary Angiography;  Surgeon: Nelva Bush, MD;  Location: [facility];  Service: Cardiovascular;  Laterality: N/A;  . POLYPECTOMY  01/16/2018   Procedure: POLYPECTOMY;  Surgeon: Rush Landmark Telford Nab., MD;  Location: [facility];  Service: Gastroenterology;;  . RIGHT/LEFT HEART CATH AND CORONARY ANGIOGRAPHY N/A 10/13/2017   Procedure: RIGHT/LEFT HEART CATH AND CORONARY ANGIOGRAPHY;  Surgeon: Belva Crome, MD;  Location: [facility];  Service: Cardiovascular;  Laterality: N/A;  . SHOULDER ARTHROSCOPY    . TUBAL LIGATION      Family History  Problem Relation Age of Onset  . Stroke Mother   . Diabetes Father   . Diabetes Sister   . Diabetes Sister   .  Diabetes Son   . Colon cancer Neg Hx   . Esophageal cancer Neg Hx     Social History   Tobacco Use  . Smoking status: Current Some Day Smoker    Packs/day: 1.00    Years: 44.00    Pack years: 44.00    Types: Cigarettes    Last attempt to quit: 11/15/2018    Years since quitting: 0.0  . Smokeless tobacco: Never Used  . Tobacco comment: needs a patch  Substance Use Topics  . Alcohol use: Yes    Alcohol/week: 7.0 standard drinks    Types: 7 Shots of liquor  per week    Comment: 1 drink a day at the most  . Drug use: Not Currently    Types: Marijuana    Comment: 09/05/18 "about a month ago, my granddaughter set me up on something"    Current Outpatient Medications  Medication Sig Dispense Refill  . acetaminophen (TYLENOL) 500 MG tablet Take 1 tablet (500 mg total) by mouth every 6 (six) hours as needed for mild pain. 30 tablet 0  . albuterol (PROAIR HFA) 108 (90 Base) MCG/ACT inhaler Inhale 2 puffs into the lungs every 4-6 hours as needed for shortness of breath or wheezing (Patient taking differently: Inhale 2 puffs into the lungs every 4 (four) hours as needed for wheezing or shortness of breath. ) 1 Inhaler 11  . albuterol (PROVENTIL) (2.5 MG/3ML) 0.083% nebulizer solution Take 3 mLs (2.5 mg total) by nebulization every 6 (six) hours as needed for wheezing or shortness of breath. 75 mL 12  . Amino Acids-Protein Hydrolys (FEEDING SUPPLEMENT, PRO-STAT SUGAR FREE 64,) LIQD Take 30 mLs by mouth 2 (two) times daily. 887 mL 0  . aspirin EC 81 MG EC tablet Take 1 tablet (81 mg total) by mouth daily. 30 tablet 0  . atorvastatin (LIPITOR) 80 MG tablet Take 80 mg by mouth every evening.     Marland Kitchen CALCIUM POLYCARBOPHIL PO Take 3 tablets by mouth daily.    . Cholecalciferol (VITAMIN D3) 2000 units capsule Take 2,000 Units by mouth daily.     . clopidogrel (PLAVIX) 75 MG tablet Take 1 tablet (75 mg total) by mouth daily. (Patient taking differently: Take 75 mg by mouth at bedtime. ) 30 tablet 0  . [START ON 11/28/2018] dexamethasone (DECADRON) 1 MG tablet Take 1 tablet (1 mg total) by mouth daily for 5 days, THEN 0.5 tablets (0.5 mg total) daily for 5 days. 8 tablet 0  . dexamethasone (DECADRON) 4 MG tablet Take 1 tablet (4 mg total) by mouth daily for 5 days, THEN 0.5 tablets (2 mg total) daily for 5 days. 8 tablet 0  . DULoxetine (CYMBALTA) 60 MG capsule Take 120 mg by mouth daily.   0  . EMGALITY 120 MG/ML SOAJ Take 120 mg by mouth every 30 (thirty) days.    .  furosemide (LASIX) 40 MG tablet Take 1 tablet (40 mg total) by mouth 2 (two) times daily. Make take one additional tablet for weight gain of 3 lbs overnight or 5 lbs in one week. 30 tablet 0  . hydrocortisone (CORTEF) 5 MG tablet Take 5-15 mg by mouth See admin instructions. Take 3 tablets in the morning and take 1 tablet in the evening    . insulin degludec (TRESIBA FLEXTOUCH) 100 UNIT/ML SOPN FlexTouch Pen Inject 42 Units into the skin at bedtime.     Marland Kitchen ipratropium-albuterol (DUONEB) 0.5-2.5 (3) MG/3ML SOLN Inhale 3 mLs into the lungs daily.    Marland Kitchen  Iron-FA-B Cmp-C-Biot-Probiotic (FUSION PLUS) CAPS Take 1 capsule by mouth at bedtime.   5  . levothyroxine (SYNTHROID, LEVOTHROID) 150 MCG tablet Take 150 mcg by mouth daily before breakfast.    . MAGNESIUM-OXIDE 400 (241.3 Mg) MG tablet Take 400 mg by mouth daily.    . nitroGLYCERIN (NITROSTAT) 0.4 MG SL tablet Place 1 tablet (0.4 mg total) under the tongue every 5 (five) minutes as needed for chest pain. 25 tablet 3  . OXYGEN Place 2 L into the nose continuous.     . polyethylene glycol (MIRALAX / GLYCOLAX) packet Take 17 g by mouth daily. (Patient taking differently: Take 17 g by mouth daily as needed for mild constipation. ) 14 each 0  . potassium chloride 20 MEQ TBCR Take 10 mEq by mouth daily. 30 tablet 0  . pramipexole (MIRAPEX) 1 MG tablet Take 1 mg by mouth 2 (two) times a day.    . sacubitril-valsartan (ENTRESTO) 24-26 MG Take 1 tablet by mouth 2 (two) times daily. 60 tablet 0  . tiZANidine (ZANAFLEX) 4 MG tablet Take 4 mg by mouth 2 (two) times daily as needed for muscle spasms.    . TRELEGY ELLIPTA 100-62.5-25 MCG/INH AEPB Inhale 1 puff into the lungs daily.    . TRULICITY 1.5 0000000 SOPN Inject 1.5 mg into the skin every Sunday.   4  . zolpidem (AMBIEN) 5 MG tablet Take 5 mg by mouth at bedtime as needed for sleep.    Marland Kitchen LORazepam (ATIVAN PO) Take by mouth.     No current facility-administered medications for this visit.     Allergies   Allergen Reactions  . Hydroxychloroquine Shortness Of Breath, Nausea Only and Other (See Comments)    Dizziness  . Donepezil Other (See Comments)    Dizziness, depression, and makes the patient feel "funny"  . Prednisone Other (See Comments)    Causes depression and suicidal thoughts  . Anticoagulant Cit Dext [Acd Formula A] Other (See Comments)    Unknown  . Bupropion Other (See Comments)    Suicidal thoughts  . Metrizamide Other (See Comments)    (a non-ionic radiopaque contrast agent) "Blows the vein" and contrast gathers at the injected site's limb  . Varenicline Other (See Comments)    Suicidal thoughts  . Tape Rash and Other (See Comments)    Paper tape is preferred, PLEASE    Review of Systems:  neg     Physical Exam:    BP 122/68   Pulse 82   Temp (!) 97.5 F (36.4 C)   Ht 5\' 2"  (1.575 m)   Wt 235 lb (106.6 kg)   BMI 42.98 kg/m  Filed Weights   11/23/18 1010  Weight: 235 lb (106.6 kg)   Constitutional:  Well-developed, in no acute distress. Psychiatric: Normal mood and affect. Behavior is normal. HEENT: Pupils normal.  Conjunctivae are normal. No scleral icterus. Neck supple.  Cardiovascular: Normal rate, regular rhythm. No edema Pulmonary/chest: On home oxygen.  Bilateral decreased breath sounds. Abdominal: Soft, nondistended. Nontender. Bowel sounds active throughout.  Multiple upper abdominal scars.  Incisional hernia.  Nontender.  There is diastasis recti. Rectal:  defered Neurological: Alert and oriented to person place and time. Skin: Skin is warm and dry. No rashes noted.  Data Reviewed: I have personally reviewed following labs and imaging studies  CBC: CBC Latest Ref Rng & Units 11/22/2018 11/18/2018 11/17/2018  WBC 3.4 - 10.8 x10E3/uL 17.5(H) 10.8(H) 11.8(H)  Hemoglobin 11.1 - 15.9 g/dL 13.1 12.3 11.4(L)  Hematocrit 34.0 - 46.6 % 38.6 39.4 36.3  Platelets 150 - 450 x10E3/uL 206 189 191    CMP: CMP Latest Ref Rng & Units 11/22/2018 11/18/2018  11/16/2018  Glucose 65 - 99 mg/dL 160(H) 251(H) 225(H)  BUN 8 - 27 mg/dL 29(H) 38(H) 38(H)  Creatinine 0.57 - 1.00 mg/dL 1.19(H) 1.19(H) 1.43(H)  Sodium 134 - 144 mmol/L 140 137 139  Potassium 3.5 - 5.2 mmol/L 4.7 4.8 3.8  Chloride 96 - 106 mmol/L 97 102 103  CO2 20 - 29 mmol/L 27 28 26   Calcium 8.7 - 10.3 mg/dL 10.0 9.0 9.4  Total Protein 6.0 - 8.5 g/dL 6.4 - 6.3(L)  Total Bilirubin 0.0 - 1.2 mg/dL 0.2 - 0.7  Alkaline Phos 39 - 117 IU/L 94 - 72  AST 0 - 40 IU/L 14 - 22  ALT 0 - 32 IU/L 23 - 42    GFR: Estimated Creatinine Clearance: 55.5 mL/min (A) (by C-G formula based on SCr of 1.19 mg/dL (H)). Liver Function Tests: Recent Labs  Lab 11/16/18 1805 11/22/18 1104  AST 22 14  ALT 42 23  ALKPHOS 72 94  BILITOT 0.7 0.2  PROT 6.3* 6.4  ALBUMIN 2.7* 3.8   25 minutes spent with the patient today. Greater than 50% was spent in counseling and coordination of care with the patient    Carmell Austria, MD 11/23/2018, 10:29 AM  Cc: Dortha Kern, PA

## 2018-11-23 NOTE — Telephone Encounter (Signed)
Called Mrs. Kings to review lab results. All questions answered. No changes to current plan of care nor medications at this time.   Leukocytosis likely due to current steroid post hospitalization - denies new signs/symptoms of infection. Kidney function stable. Normal liver function in setting of statin therapy. Normal electrolytes. LDL elevated at 102 - tells me it was not a true fasting profile. Will plan to recheck fasting lipid profile at next office visit. May require addition of Zetia for LDL goal <70 insetting of CAD.   Loel Dubonnet, NP

## 2018-11-25 DIAGNOSIS — J449 Chronic obstructive pulmonary disease, unspecified: Secondary | ICD-10-CM | POA: Diagnosis not present

## 2018-11-26 DIAGNOSIS — Z79899 Other long term (current) drug therapy: Secondary | ICD-10-CM | POA: Diagnosis not present

## 2018-11-26 DIAGNOSIS — E119 Type 2 diabetes mellitus without complications: Secondary | ICD-10-CM | POA: Diagnosis not present

## 2018-11-26 DIAGNOSIS — E2749 Other adrenocortical insufficiency: Secondary | ICD-10-CM | POA: Diagnosis not present

## 2018-11-26 DIAGNOSIS — E039 Hypothyroidism, unspecified: Secondary | ICD-10-CM | POA: Diagnosis not present

## 2018-11-27 ENCOUNTER — Emergency Department (HOSPITAL_COMMUNITY): Payer: Federal, State, Local not specified - PPO

## 2018-11-27 ENCOUNTER — Emergency Department (HOSPITAL_COMMUNITY)
Admission: EM | Admit: 2018-11-27 | Discharge: 2018-11-28 | Disposition: A | Discharge status: Home / Self Care | Payer: Federal, State, Local not specified - PPO | Source: Home / Self Care | Attending: Emergency Medicine | Admitting: Emergency Medicine

## 2018-11-27 ENCOUNTER — Emergency Department (HOSPITAL_BASED_OUTPATIENT_CLINIC_OR_DEPARTMENT_OTHER): Payer: Federal, State, Local not specified - PPO

## 2018-11-27 ENCOUNTER — Other Ambulatory Visit: Payer: Self-pay

## 2018-11-27 ENCOUNTER — Emergency Department (HOSPITAL_COMMUNITY)
Admission: EM | Admit: 2018-11-27 | Discharge: 2018-11-27 | Disposition: A | Discharge status: Home / Self Care | Payer: Federal, State, Local not specified - PPO | Attending: Emergency Medicine | Admitting: Emergency Medicine

## 2018-11-27 ENCOUNTER — Encounter (HOSPITAL_COMMUNITY): Payer: Self-pay

## 2018-11-27 ENCOUNTER — Encounter (HOSPITAL_COMMUNITY): Payer: Self-pay | Admitting: Emergency Medicine

## 2018-11-27 DIAGNOSIS — M79605 Pain in left leg: Secondary | ICD-10-CM | POA: Diagnosis not present

## 2018-11-27 DIAGNOSIS — F17210 Nicotine dependence, cigarettes, uncomplicated: Secondary | ICD-10-CM | POA: Insufficient documentation

## 2018-11-27 DIAGNOSIS — I745 Embolism and thrombosis of iliac artery: Secondary | ICD-10-CM | POA: Diagnosis not present

## 2018-11-27 DIAGNOSIS — Z79899 Other long term (current) drug therapy: Secondary | ICD-10-CM | POA: Insufficient documentation

## 2018-11-27 DIAGNOSIS — Z7982 Long term (current) use of aspirin: Secondary | ICD-10-CM | POA: Insufficient documentation

## 2018-11-27 DIAGNOSIS — R1030 Lower abdominal pain, unspecified: Secondary | ICD-10-CM | POA: Diagnosis not present

## 2018-11-27 DIAGNOSIS — I130 Hypertensive heart and chronic kidney disease with heart failure and stage 1 through stage 4 chronic kidney disease, or unspecified chronic kidney disease: Secondary | ICD-10-CM | POA: Insufficient documentation

## 2018-11-27 DIAGNOSIS — Z7901 Long term (current) use of anticoagulants: Secondary | ICD-10-CM | POA: Insufficient documentation

## 2018-11-27 DIAGNOSIS — M7989 Other specified soft tissue disorders: Secondary | ICD-10-CM | POA: Diagnosis not present

## 2018-11-27 DIAGNOSIS — R52 Pain, unspecified: Secondary | ICD-10-CM | POA: Diagnosis not present

## 2018-11-27 DIAGNOSIS — Z794 Long term (current) use of insulin: Secondary | ICD-10-CM | POA: Insufficient documentation

## 2018-11-27 DIAGNOSIS — Z9981 Dependence on supplemental oxygen: Secondary | ICD-10-CM | POA: Insufficient documentation

## 2018-11-27 DIAGNOSIS — N182 Chronic kidney disease, stage 2 (mild): Secondary | ICD-10-CM | POA: Insufficient documentation

## 2018-11-27 DIAGNOSIS — J449 Chronic obstructive pulmonary disease, unspecified: Secondary | ICD-10-CM | POA: Insufficient documentation

## 2018-11-27 DIAGNOSIS — E1122 Type 2 diabetes mellitus with diabetic chronic kidney disease: Secondary | ICD-10-CM | POA: Insufficient documentation

## 2018-11-27 DIAGNOSIS — E039 Hypothyroidism, unspecified: Secondary | ICD-10-CM | POA: Insufficient documentation

## 2018-11-27 DIAGNOSIS — M79609 Pain in unspecified limb: Secondary | ICD-10-CM | POA: Diagnosis not present

## 2018-11-27 DIAGNOSIS — Z7902 Long term (current) use of antithrombotics/antiplatelets: Secondary | ICD-10-CM | POA: Insufficient documentation

## 2018-11-27 DIAGNOSIS — I739 Peripheral vascular disease, unspecified: Secondary | ICD-10-CM | POA: Diagnosis not present

## 2018-11-27 DIAGNOSIS — R1032 Left lower quadrant pain: Secondary | ICD-10-CM | POA: Insufficient documentation

## 2018-11-27 DIAGNOSIS — I25110 Atherosclerotic heart disease of native coronary artery with unstable angina pectoris: Secondary | ICD-10-CM | POA: Insufficient documentation

## 2018-11-27 DIAGNOSIS — I5042 Chronic combined systolic (congestive) and diastolic (congestive) heart failure: Secondary | ICD-10-CM | POA: Insufficient documentation

## 2018-11-27 DIAGNOSIS — M79604 Pain in right leg: Secondary | ICD-10-CM | POA: Diagnosis not present

## 2018-11-27 DIAGNOSIS — N183 Chronic kidney disease, stage 3 (moderate): Secondary | ICD-10-CM | POA: Insufficient documentation

## 2018-11-27 DIAGNOSIS — R0602 Shortness of breath: Secondary | ICD-10-CM | POA: Diagnosis not present

## 2018-11-27 LAB — I-STAT CHEM 8, ED
BUN: 35 mg/dL — ABNORMAL HIGH (ref 8–23)
Calcium, Ion: 1.24 mmol/L (ref 1.15–1.40)
Chloride: 102 mmol/L (ref 98–111)
Creatinine, Ser: 1.1 mg/dL — ABNORMAL HIGH (ref 0.44–1.00)
Glucose, Bld: 97 mg/dL (ref 70–99)
HCT: 40 % (ref 36.0–46.0)
Hemoglobin: 13.6 g/dL (ref 12.0–15.0)
Potassium: 4.3 mmol/L (ref 3.5–5.1)
Sodium: 138 mmol/L (ref 135–145)
TCO2: 31 mmol/L (ref 22–32)

## 2018-11-27 LAB — URINALYSIS, ROUTINE W REFLEX MICROSCOPIC
Bilirubin Urine: NEGATIVE
Glucose, UA: NEGATIVE mg/dL
Hgb urine dipstick: NEGATIVE
Ketones, ur: NEGATIVE mg/dL
Leukocytes,Ua: NEGATIVE
Nitrite: NEGATIVE
Protein, ur: NEGATIVE mg/dL
Specific Gravity, Urine: >1.046 — ABNORMAL HIGH (ref 1.005–1.030)
pH: 5 (ref 5.0–8.0)

## 2018-11-27 LAB — TROPONIN I (HIGH SENSITIVITY)
Troponin I (High Sensitivity): 32 ng/L — ABNORMAL HIGH (ref <18)
Troponin I (High Sensitivity): 34 ng/L — ABNORMAL HIGH (ref <18)

## 2018-11-27 LAB — BASIC METABOLIC PANEL
Anion gap: 10 (ref 5–15)
BUN: 30 mg/dL — ABNORMAL HIGH (ref 8–23)
CO2: 27 mmol/L (ref 22–32)
Calcium: 9.5 mg/dL (ref 8.9–10.3)
Chloride: 102 mmol/L (ref 98–111)
Creatinine, Ser: 1.09 mg/dL — ABNORMAL HIGH (ref 0.44–1.00)
GFR calc Af Amer: >60 mL/min (ref >60)
GFR calc non Af Amer: 54 mL/min — ABNORMAL LOW (ref >60)
Glucose, Bld: 101 mg/dL — ABNORMAL HIGH (ref 70–99)
Potassium: 4.4 mmol/L (ref 3.5–5.1)
Sodium: 139 mmol/L (ref 135–145)

## 2018-11-27 LAB — CBC WITH DIFFERENTIAL/PLATELET
Abs Immature Granulocytes: 0.12 10*3/uL — ABNORMAL HIGH (ref 0.00–0.07)
Basophils Absolute: 0.1 10*3/uL (ref 0.0–0.1)
Basophils Relative: 0 %
Eosinophils Absolute: 0.1 10*3/uL (ref 0.0–0.5)
Eosinophils Relative: 1 %
HCT: 41.4 % (ref 36.0–46.0)
Hemoglobin: 13 g/dL (ref 12.0–15.0)
Immature Granulocytes: 1 %
Lymphocytes Relative: 10 %
Lymphs Abs: 1.7 10*3/uL (ref 0.7–4.0)
MCH: 29.7 pg (ref 26.0–34.0)
MCHC: 31.4 g/dL (ref 30.0–36.0)
MCV: 94.7 fL (ref 80.0–100.0)
Monocytes Absolute: 1.2 10*3/uL — ABNORMAL HIGH (ref 0.1–1.0)
Monocytes Relative: 8 %
Neutro Abs: 12.8 10*3/uL — ABNORMAL HIGH (ref 1.7–7.7)
Neutrophils Relative %: 80 %
Platelets: 202 10*3/uL (ref 150–400)
RBC: 4.37 MIL/uL (ref 3.87–5.11)
RDW: 15.3 % (ref 11.5–15.5)
WBC: 16 10*3/uL — ABNORMAL HIGH (ref 4.0–10.5)
nRBC: 0 % (ref 0.0–0.2)

## 2018-11-27 LAB — CK: Total CK: 28 U/L — ABNORMAL LOW (ref 38–234)

## 2018-11-27 MED ORDER — HYDROCODONE-ACETAMINOPHEN 5-325 MG PO TABS: 1.0000 | ORAL_TABLET | Freq: Four times a day (QID) | ORAL | 0 refills | Status: AC | PRN

## 2018-11-27 MED ORDER — MORPHINE SULFATE (PF) 4 MG/ML IV SOLN
4.0000 mg | Freq: Once | INTRAVENOUS | Status: AC
  Administered 2018-11-27: 09:00:00 4 mg via INTRAVENOUS
  Filled 2018-11-27: qty 1

## 2018-11-27 MED ORDER — IOHEXOL 350 MG/ML SOLN
100.0000 mL | Freq: Once | INTRAVENOUS | Status: AC | PRN
  Administered 2018-11-27: 100 mL via INTRAVENOUS

## 2018-11-27 MED ORDER — MORPHINE SULFATE (PF) 4 MG/ML IV SOLN
4.0000 mg | Freq: Once | INTRAVENOUS | Status: AC
  Administered 2018-11-27: 4 mg via INTRAVENOUS
  Filled 2018-11-27: qty 1

## 2018-11-27 NOTE — Consult Note (Addendum)
[facility] NOTE   MRN : [id number]  Reason for Consult: Acute Right thigh pain Referring Physician: ED  History of Present Illness: 63 y/o female states she woke up at 1 am with sudden right thigh pain.  She denise injury/trauma.  She states the pain is worse with weight bearing and causes shooting pain with pins and needles.  She received IV morphine with help significantly with the pain.    She denise calf pain, loss of sensation and no right foot pain at rest.  She has no history of LE pain.  She has limited ambulation secondary to COPD and is O2 dependent.  She noted a heart cath with stick site in the right groin, but this occurred 10/13/17.    Pat medical history includes:tobacco abuse, HTN, Hyperlipidemia, DM CKD, Chronic heart failure, and CAD.       No current facility-administered medications for this encounter.    Current Outpatient Medications  Medication Sig Dispense Refill  . acetaminophen (TYLENOL) 500 MG tablet Take 1 tablet (500 mg total) by mouth every 6 (six) hours as needed for mild pain. 30 tablet 0  . albuterol (PROAIR HFA) 108 (90 Base) MCG/ACT inhaler Inhale 2 puffs into the lungs every 4-6 hours as needed for shortness of breath or wheezing (Patient taking differently: Inhale 2 puffs into the lungs every 4 (four) hours as needed for wheezing or shortness of breath. ) 1 Inhaler 11  . albuterol (PROVENTIL) (2.5 MG/3ML) 0.083% nebulizer solution Take 3 mLs (2.5 mg total) by nebulization every 6 (six) hours as needed for wheezing or shortness of breath. 75 mL 12  . Amino Acids-Protein Hydrolys (FEEDING SUPPLEMENT, PRO-STAT SUGAR FREE 64,) LIQD Take 30 mLs by mouth 2 (two) times daily. 887 mL 0  . aspirin EC 81 MG EC tablet Take 1 tablet (81 mg total) by mouth daily. 30 tablet 0  . atorvastatin (LIPITOR) 80 MG tablet Take 80 mg by mouth every evening.     Marland Kitchen CALCIUM POLYCARBOPHIL PO Take 3 tablets by mouth daily.    . Cholecalciferol  (VITAMIN D3) 2000 units capsule Take 2,000 Units by mouth daily.     . clopidogrel (PLAVIX) 75 MG tablet Take 1 tablet (75 mg total) by mouth daily. (Patient taking differently: Take 75 mg by mouth at bedtime. ) 30 tablet 0  . [START ON 11/28/2018] dexamethasone (DECADRON) 1 MG tablet Take 1 tablet (1 mg total) by mouth daily for 5 days, THEN 0.5 tablets (0.5 mg total) daily for 5 days. 8 tablet 0  . dexamethasone (DECADRON) 4 MG tablet Take 1 tablet (4 mg total) by mouth daily for 5 days, THEN 0.5 tablets (2 mg total) daily for 5 days. 8 tablet 0  . DULoxetine (CYMBALTA) 60 MG capsule Take 120 mg by mouth daily.   0  . EMGALITY 120 MG/ML SOAJ Take 120 mg by mouth every 30 (thirty) days.    . furosemide (LASIX) 40 MG tablet Take 1 tablet (40 mg total) by mouth 2 (two) times daily. Make take one additional tablet for weight gain of 3 lbs overnight or 5 lbs in one week. 30 tablet 0  . hydrocortisone (CORTEF) 5 MG tablet Take 5-15 mg by mouth See admin instructions. Take 3 tablets in the morning and take 1 tablet in the evening    . insulin degludec (TRESIBA FLEXTOUCH) 100 UNIT/ML SOPN FlexTouch Pen Inject 42 Units into the skin at bedtime.     Marland Kitchen ipratropium-albuterol (  DUONEB) 0.5-2.5 (3) MG/3ML SOLN Inhale 3 mLs into the lungs daily.    . Iron-FA-B Cmp-C-Biot-Probiotic (FUSION PLUS) CAPS Take 1 capsule by mouth at bedtime.   5  . levothyroxine (SYNTHROID, LEVOTHROID) 150 MCG tablet Take 150 mcg by mouth daily before breakfast.    . LORazepam (ATIVAN PO) Take by mouth.    Marland Kitchen MAGNESIUM-OXIDE 400 (241.3 Mg) MG tablet Take 400 mg by mouth daily.    . nitroGLYCERIN (NITROSTAT) 0.4 MG SL tablet Place 1 tablet (0.4 mg total) under the tongue every 5 (five) minutes as needed for chest pain. 25 tablet 3  . OXYGEN Place 2 L into the nose continuous.     . polyethylene glycol (MIRALAX / GLYCOLAX) packet Take 17 g by mouth daily. (Patient taking differently: Take 17 g by mouth daily as needed for mild constipation. )  14 each 0  . potassium chloride 20 MEQ TBCR Take 10 mEq by mouth daily. 30 tablet 0  . pramipexole (MIRAPEX) 1 MG tablet Take 1 mg by mouth 2 (two) times a day.    . sacubitril-valsartan (ENTRESTO) 24-26 MG Take 1 tablet by mouth 2 (two) times daily. 60 tablet 0  . tiZANidine (ZANAFLEX) 4 MG tablet Take 4 mg by mouth 2 (two) times daily as needed for muscle spasms.    . TRELEGY ELLIPTA 100-62.5-25 MCG/INH AEPB Inhale 1 puff into the lungs daily.    . TRULICITY 1.5 0000000 SOPN Inject 1.5 mg into the skin every Sunday.   4  . zolpidem (AMBIEN) 5 MG tablet Take 5 mg by mouth at bedtime as needed for sleep.      Pt meds include: Statin :Yes Betablocker: No ASA: Yes Other anticoagulants/antiplatelets: Plavix  Past Medical History:  Diagnosis Date  . Acute on chronic systolic heart failure exacerbation(HCC) 04/08/2016  . Arthritis   . CAD in native artery    a. Prior LAD stenting based on cath. b. RCA stenting 03/2016 x2.  . Chronic combined systolic and diastolic CHF (congestive heart failure) ([place])   . CKD (chronic kidney disease), stage II   . COPD (chronic obstructive pulmonary disease) ([place])   . Diabetes mellitus without complication ([place])   . Hashimoto's thyroiditis   . History of blood transfusion   . Hyperlipidemia   . Hypertension   . On home oxygen therapy    "2L; 24/7" (10/23/2017)  . Secondary adrenal insufficiency ([place])   . Thyroid disease   . Tobacco abuse     Past Surgical History:  Procedure Laterality Date  . ABDOMINAL SURGERY    . CESAREAN SECTION    . CHOLECYSTECTOMY    . COLON RESECTION    . COLONOSCOPY WITH PROPOFOL N/A 01/16/2018   Procedure: COLONOSCOPY WITH PROPOFOL;  Surgeon: Rush Landmark Telford Nab., MD;  Location: [facility];  Service: Gastroenterology;  Laterality: N/A;  . CORONARY BALLOON ANGIOPLASTY N/A 10/13/2017   Procedure: CORONARY BALLOON ANGIOPLASTY;  Surgeon: Belva Crome, MD;  Location: [facility];  Service: Cardiovascular;   Laterality: N/A;  . HERNIA MESH REMOVAL    . HERNIA REPAIR    . LEFT HEART CATH AND CORONARY ANGIOGRAPHY N/A 10/13/2016   Procedure: Left Heart Cath and Coronary Angiography;  Surgeon: Nelva Bush, MD;  Location: [facility];  Service: Cardiovascular;  Laterality: N/A;  . POLYPECTOMY  01/16/2018   Procedure: POLYPECTOMY;  Surgeon: Rush Landmark Telford Nab., MD;  Location: [facility];  Service: Gastroenterology;;  . RIGHT/LEFT HEART CATH AND CORONARY ANGIOGRAPHY N/A 10/13/2017  Procedure: RIGHT/LEFT HEART CATH AND CORONARY ANGIOGRAPHY;  Surgeon: Belva Crome, MD;  Location: [facility];  Service: Cardiovascular;  Laterality: N/A;  . SHOULDER ARTHROSCOPY    . TUBAL LIGATION      Social History Social History   Tobacco Use  . Smoking status: Current Some Day Smoker    Packs/day: 1.00    Years: 44.00    Pack years: 44.00    Types: Cigarettes    Last attempt to quit: 11/15/2018    Years since quitting: 0.0  . Smokeless tobacco: Never Used  . Tobacco comment: needs a patch  Substance Use Topics  . Alcohol use: Yes    Alcohol/week: 7.0 standard drinks    Types: 7 Shots of liquor per week    Comment: 1 drink a day at the most  . Drug use: Not Currently    Types: Marijuana    Comment: 09/05/18 "about a month ago, my granddaughter set me up on something"    Family History Family History  Problem Relation Age of Onset  . Stroke Mother   . Diabetes Father   . Diabetes Sister   . Diabetes Sister   . Diabetes Son   . Colon cancer Neg Hx   . Esophageal cancer Neg Hx     Allergies  Allergen Reactions  . Hydroxychloroquine Shortness Of Breath, Nausea Only and Other (See Comments)    Dizziness  . Donepezil Other (See Comments)    Dizziness, depression, and makes the patient feel "funny"  . Prednisone Other (See Comments)    Causes depression and suicidal thoughts  . Anticoagulant Cit Dext [Acd Formula A] Other (See Comments)    Unknown  . Bupropion Other (See  Comments)    Suicidal thoughts  . Metrizamide Other (See Comments)    (a non-ionic radiopaque contrast agent) "Blows the vein" and contrast gathers at the injected site's limb  . Varenicline Other (See Comments)    Suicidal thoughts  . Tape Rash and Other (See Comments)    Paper tape is preferred, PLEASE     REVIEW OF SYSTEMS  General: [ ]  Weight loss, [ ]  Fever, [ ]  chills Neurologic: [ ]  Dizziness, [ ]  Blackouts, [ ]  Seizure [ ]  Stroke, [ ]  "Mini stroke", [ ]  Slurred speech, [ ]  Temporary blindness; [ ]  weakness in arms or legs, [ ]  Hoarseness [ ]  Dysphagia Cardiac: [ ]  Chest pain/pressure, [x ] Shortness of breath at rest [x ] Shortness of breath with exertion, [ ]  Atrial fibrillation or irregular heartbeat  Vascular: [ ]  Pain in legs with walking, [ ]  Pain in legs at rest, [ ]  Pain in legs at night,  [ ]  Non-healing ulcer, [ ]  Blood clot in vein/DVT,   Pulmonary: [x ] Home oxygen, [ ]  Productive cough, [ ]  Coughing up blood, [ ]  Asthma,  [ ]  Wheezing [x ] COPD Musculoskeletal:  [ ]  Arthritis, [ ]  Low back pain, [ ]  Joint pain Hematologic: [ ]  Easy Bruising, [ ]  Anemia; [ ]  Hepatitis Gastrointestinal: [ ]  Blood in stool, [ ]  Gastroesophageal Reflux/heartburn, Urinary: x[ ]  chronic Kidney disease, [ ]  on HD - [ ]  MWF or [ ]  TTHS, [ ]  Burning with urination, [ ]  Difficulty urinating Skin: [ ]  Rashes, [ ]  Wounds Psychological: [ ]  Anxiety, [ ]  Depression  Physical Examination Vitals:   11/27/18 0900 11/27/18 1115 11/27/18 1130 11/27/18 1131  BP: 132/62 108/60  (!) 116/55  Pulse: 88  77 80  Resp: 16 16 15 19   Temp:      TempSrc:      SpO2: 99%  98% 98%  Weight:      Height:       Body mass index is 42.98 kg/m.  General:  WDWN in NAD HENT: WNL Eyes: Pupils equal Pulmonary: normal non-labored breathing on O2 , without Rales, rhonchi,  Wheezing.  Decreased breath sounds Cardiac: RRR, without  Murmurs, rubs or gallops; No carotid bruits Abdomen: soft, NT, incisional  hernia, multiple scars Skin: no rashes, ulcers noted;  no Gangrene , chronic cellulitis left LE; no open wounds;   Vascular Exam/Pulses:Palpable PT B LE, biphasic doppler DP/peroneal.  Doppler femoral right secondary to body habitus.   Musculoskeletal: no muscle wasting or atrophy; pitting edema B LE  Neurologic: A&O X 3; Appropriate Affect ;  SENSATION: normal; MOTOR FUNCTION: 5/5 Symmetric Speech is fluent/normal   Significant Diagnostic Studies: CBC Lab Results  Component Value Date   WBC 16.0 (H) 11/27/2018   HGB 13.6 11/27/2018   HCT 40.0 11/27/2018   MCV 94.7 11/27/2018   PLT 202 11/27/2018    BMET    Component Value Date/Time   NA 138 11/27/2018 0857   NA 140 11/22/2018 1104   K 4.3 11/27/2018 0857   CL 102 11/27/2018 0857   CO2 27 11/27/2018 0850   GLUCOSE 97 11/27/2018 0857   BUN 35 (H) 11/27/2018 0857   BUN 29 (H) 11/22/2018 1104   CREATININE 1.10 (H) 11/27/2018 0857   CALCIUM 9.5 11/27/2018 0850   GFRNONAA 54 (L) 11/27/2018 0850   GFRAA >60 11/27/2018 0850   Estimated Creatinine Clearance: 60.1 mL/min (A) (by C-G formula based on SCr of 1.1 mg/dL (H)).  COAG Lab Results  Component Value Date   INR 0.9 11/15/2018   INR 0.9 09/05/2018   INR 1.02 10/13/2016     Non-Invasive Vascular Imaging:  Venous study right LE negative for DVT  CTA abdomin and iliofemoral runoff IMPRESSION: VASCULAR  1. Chronic left inflow disease. Chronic occlusion of the left common iliac artery, left internal iliac artery and left external iliac artery. 2. Stenosis and diffuse calcifications involving the right inflow vessels without occlusion. Disease in the right iliac arterial system could be hemodynamically significant. 3. Mild outflow disease bilaterally. No focal occlusions involving the outflow vessels. 4.  occlusion of the left common iliac artery, left internal iliac artery and left external iliac  5. Replaced hepatic arteries as  described.  ASSESSMENT/PLAN:  No evidence of acute vascular injury or occlusions to cause right thigh pain.    Chronic PAD with calcified right iliaca vessels without inflow problems, chronic  occlusion of the left common iliac artery, left internal iliac artery and left external iliac   occlusion of the left common iliac artery, left internal iliac artery and left external iliac  No vascular intervention is needed.  We will schedule her for an outpatient f/u to get ABI's for a baseline.      [name] 11/27/2018 1:24 PM   I agree with the above.  I seen evaluate the patient.  She came in with right leg pain which she described as a shooting pain going down her leg.  She has CT scan which showed chronic changes in her right iliac artery.  I did not feel like this was the source of her pain as her foot was well-perfused.  I have recommended follow-up and work-up of claudication in the office as an outpatient in about  1 month.  Annamarie Major

## 2018-11-27 NOTE — Progress Notes (Signed)
RLE venous duplex       has been completed. Preliminary results can be found under CV proc through chart review. Jakirah Zaun, BS, RDMS, RVT   

## 2018-11-27 NOTE — ED Notes (Signed)
Vascular PA at bedside  

## 2018-11-27 NOTE — ED Notes (Signed)
EDP at bedside. Doppler pulses audible.

## 2018-11-27 NOTE — ED Provider Notes (Signed)
[facility] EMERGENCY DEPARTMENT Provider Note   CSN: [id number] Arrival date & time: 11/27/18  [id number]     History   Chief Complaint Chief Complaint  Patient presents with   Leg Pain   Shortness of Breath    HPI Crystal Yates is a 63 y.o. female with a past medical history of stage II CKD, COPD, diabetes, CAD, pseudoaneurysm in July 2019 which is repaired with compression, hypertension, hyperlipidemia, chronic home oxygen therapy, discharged from hospital approximately 1 week ago for respiratory distress requiring intubation presents to ED for sudden onset of right upper leg pain.  Describes the pain as sharp, worse with palpation, movement and not improved with Tylenol.  She states that pain woke up from her sleep at approximately 1 AM.  States that this feels similar to when she developed a pseudoaneurysm although at that time she had "football sized" swelling in her groin.  This developed after this was used as a cannulization site for her coronary angioplasty.  She states that this is causing her to be short of breath but she is on her usual 3 L of oxygen at baseline by nasal cannula.  She denies any fevers, redness, injuries or falls, numbness in arms or legs, vision changes, trauma to the area.    HPI  Past Medical History:  Diagnosis Date   Acute on chronic systolic heart failure exacerbation(HCC) 04/08/2016   Arthritis    CAD in native artery    a. Prior LAD stenting based on cath. b. RCA stenting 03/2016 x2.   Chronic combined systolic and diastolic CHF (congestive heart failure) (HCC)    CKD (chronic kidney disease), stage II    COPD (chronic obstructive pulmonary disease) (HCC)    Diabetes mellitus without complication ([place])    Hashimoto's thyroiditis    History of blood transfusion    Hyperlipidemia    Hypertension    On home oxygen therapy    "2L; 24/7" (10/23/2017)   Secondary adrenal insufficiency (HCC)    Thyroid disease      Tobacco abuse     Patient Active Problem List   Diagnosis Date Noted   Metabolic acidosis, NAG, bicarbonate losses    Acute respiratory failure ([place]) 11/14/2018   Sepsis due to pneumonia ([place]) 09/05/2018   Coagulation defect ([place]) 08/23/2018   Ventral hernia 08/06/2018   Dyspnea [id number]   Acute diastolic CHF (congestive heart failure) ([place]) 07/19/2018   Hypoglycemia 07/10/2018   Abdominal pain 07/10/2018   Alcohol use 07/10/2018   Type II diabetes mellitus with renal manifestations ([place]) 07/10/2018   CKD (chronic kidney disease) stage 3, GFR 30-59 ml/min (HCC) 07/10/2018   Chronic combined systolic (congestive) and diastolic (congestive) heart failure ([place]) 07/10/2018   COPD exacerbation ([place]) 07/10/2018   Suspected Covid-19 Virus Infection 07/10/2018   Chronic respiratory failure with hypoxia and hypercapnia ([place]) 02/01/2018   Cigarette smoker 02/01/2018   Chronic obstructive pulmonary disease ([place]) 01/31/2018   Internal and external bleeding hemorrhoids 01/22/2018   Sessile colonic polyp 01/22/2018   Diverticulosis of colon without hemorrhage 01/22/2018   Iron deficiency 01/11/2018   Acute on chronic respiratory failure with hypoxia and hypercapnia ([place]) 01/04/2018   AKI (acute kidney injury) ([place])    Respiratory crackles at left lung base    Morbid obesity ([place]) 12/29/2017   Acute on chronic heart failure ([place]) 12/27/2017   Hyperglycemia    Coronary stent restenosis due to progression of disease    Pseudoaneurysm following procedure ([place])  Pseudoaneurysm ([place]) 10/17/2017   S/P angioplasty for ISR of dRCA 10/13/17 10/14/2017   CAD (coronary artery disease) 10/13/2017   Right upper lobe pneumonia ([place]) 05/06/2017   Pneumonia due to respiratory syncytial virus (RSV) 04/21/2017   Entrapment, radial nerve, right 03/31/2017   COPD without exacerbation ([place]) 10/14/2016   Acute on chronic combined systolic and diastolic heart failure  ([place]) 10/13/2016   Chest tightness or pressure 10/12/2016   Dyspnea on exertion 09/29/2016   History of coronary artery disease 09/29/2016   OAB (overactive bladder) 09/21/2016   Neck pain 09/08/2016   Paresthesia of arm 09/08/2016   Flank pain 08/31/2016   Neuralgia of right upper extremity 04/26/2016   Coronary artery disease 04/16/2016   Lymphocele after surgical procedure 04/16/2016   Ventricular tachycardia (paroxysmal) ([place]) 04/09/2016   Acute on chronic HFrEF (heart failure with reduced ejection fraction) ([place]) 04/08/2016   Ischemic cardiomyopathy 04/08/2016   Anemia, blood loss 09/16/2015   GI bleeding 09/16/2015   Chest pain 05/22/2015   Hypothyroid 05/22/2015   Status post hernia repair 01/29/2015   Surgical wound breakdown 01/29/2015   Hernia with strangulation 01/18/2015   Tobacco abuse 01/18/2015   Recurrent incisional hernia with incarceration 01/15/2015   Sleep apnea 01/15/2015   S/P drug eluting coronary stent placement 10/16/2014   Adrenal insufficiency ([place]) 11/21/2013   Anemia 11/21/2013   Unstable angina (HCC) 11/21/2013   Atherosclerotic heart disease of native coronary artery without angina pectoris 11/21/2013   Cardiomyopathy ([place]) 11/21/2013   Edema 11/21/2013   Empty sella syndrome ([place]) 11/21/2013   Hepatitis 11/21/2013   Hyperlipemia 11/21/2013   Hypertension 11/21/2013   Kidney cyst, acquired 11/21/2013   Fat necrosis 11/21/2013   Pulmonary emphysema ([place]) 11/21/2013   Sjogrens syndrome ([place]) 11/21/2013   Nicotine dependence, uncomplicated [id number]   Major depressive disorder, recurrent episode ([place]) 09/17/2013   Displacement of lumbar intervertebral disc 06/14/2013   Intractable migraine without aura 06/14/2013   Mild cognitive impairment 06/14/2013   Polyneuropathy in diabetes ([place]) 06/14/2013   Secondary adrenal insufficiency ([place]) 04/12/2011   DDD (degenerative disc disease), lumbosacral  01/11/2008   Thoracic or lumbosacral neuritis or radiculitis 01/11/2008    Past Surgical History:  Procedure Laterality Date   ABDOMINAL SURGERY     CESAREAN SECTION     CHOLECYSTECTOMY     COLON RESECTION     COLONOSCOPY WITH PROPOFOL N/A 01/16/2018   Procedure: COLONOSCOPY WITH PROPOFOL;  Surgeon: Irving Copas., MD;  [facility] ENDOSCOPY;  Service: Gastroenterology;  Laterality: N/A;   CORONARY BALLOON ANGIOPLASTY N/A 10/13/2017   Procedure: CORONARY BALLOON ANGIOPLASTY;  Surgeon: Belva Crome, MD;  Location: [facility] CV LAB;  Service: Cardiovascular;  Laterality: N/A;   HERNIA MESH REMOVAL     HERNIA REPAIR     LEFT HEART CATH AND CORONARY ANGIOGRAPHY N/A 10/13/2016   Procedure: Left Heart Cath and Coronary Angiography;  Surgeon: Nelva Bush, MD;  Location: [facility] CV LAB;  Service: Cardiovascular;  Laterality: N/A;   POLYPECTOMY  01/16/2018   Procedure: POLYPECTOMY;  Surgeon: Rush Landmark Telford Nab., MD;  Location: [facility] ENDOSCOPY;  Service: Gastroenterology;;   RIGHT/LEFT HEART CATH AND CORONARY ANGIOGRAPHY N/A 10/13/2017   Procedure: RIGHT/LEFT HEART CATH AND CORONARY ANGIOGRAPHY;  Surgeon: Belva Crome, MD;  Location: [facility] CV LAB;  Service: Cardiovascular;  Laterality: N/A;   SHOULDER ARTHROSCOPY     TUBAL LIGATION       OB History   No obstetric history on file.  Home Medications    Prior to Admission medications   Medication Sig Start Date End Date Taking? Authorizing Provider  acetaminophen (TYLENOL) 500 MG tablet Take 1 tablet (500 mg total) by mouth every 6 (six) hours as needed for mild pain. 07/11/18   Mariel Aloe, MD  albuterol (PROAIR HFA) 108 (90 Base) MCG/ACT inhaler Inhale 2 puffs into the lungs every 4-6 hours as needed for shortness of breath or wheezing Patient taking differently: Inhale 2 puffs into the lungs every 4 (four) hours as needed for wheezing or shortness of breath.  01/31/18   Tanda Rockers, MD    albuterol (PROVENTIL) (2.5 MG/3ML) 0.083% nebulizer solution Take 3 mLs (2.5 mg total) by nebulization every 6 (six) hours as needed for wheezing or shortness of breath. 02/28/18   Tanda Rockers, MD  Amino Acids-Protein Hydrolys (FEEDING SUPPLEMENT, PRO-STAT SUGAR FREE 64,) LIQD Take 30 mLs by mouth 2 (two) times daily. 11/18/18   Swayze, Ava, DO  aspirin EC 81 MG EC tablet Take 1 tablet (81 mg total) by mouth daily. 08/10/18   Kayleen Memos, DO  atorvastatin (LIPITOR) 80 MG tablet Take 80 mg by mouth every evening.  06/18/18   [provider]  CALCIUM POLYCARBOPHIL PO Take 3 tablets by mouth daily.    [provider]  Cholecalciferol (VITAMIN D3) 2000 units capsule Take 2,000 Units by mouth daily.     [provider]  clopidogrel (PLAVIX) 75 MG tablet Take 1 tablet (75 mg total) by mouth daily. Patient taking differently: Take 75 mg by mouth at bedtime.  01/20/18   Masoudi, Elhamalsadat, MD  dexamethasone (DECADRON) 1 MG tablet Take 1 tablet (1 mg total) by mouth daily for 5 days, THEN 0.5 tablets (0.5 mg total) daily for 5 days. 11/28/18 12/08/18  Swayze, Ava, DO  dexamethasone (DECADRON) 4 MG tablet Take 1 tablet (4 mg total) by mouth daily for 5 days, THEN 0.5 tablets (2 mg total) daily for 5 days. 11/18/18 11/28/18  Swayze, Ava, DO  DULoxetine (CYMBALTA) 60 MG capsule Take 120 mg by mouth daily.  12/05/16   [provider]  EMGALITY 120 MG/ML SOAJ Take 120 mg by mouth every 30 (thirty) days. 10/24/18   [provider]  furosemide (LASIX) 40 MG tablet Take 1 tablet (40 mg total) by mouth 2 (two) times daily. Make take one additional tablet for weight gain of 3 lbs overnight or 5 lbs in one week. 11/22/18   Loel Dubonnet, NP  HYDROcodone-acetaminophen (NORCO/VICODIN) 5-325 MG tablet Take 1 tablet by mouth every 6 (six) hours as needed. 11/27/18   Emannuel Vise, PA-C  hydrocortisone (CORTEF) 5 MG tablet Take 5-15 mg by mouth See admin instructions. Take 3 tablets  in the morning and take 1 tablet in the evening    [provider]  insulin degludec (TRESIBA FLEXTOUCH) 100 UNIT/ML SOPN FlexTouch Pen Inject 42 Units into the skin at bedtime.  08/28/16   [provider]  ipratropium-albuterol (DUONEB) 0.5-2.5 (3) MG/3ML SOLN Inhale 3 mLs into the lungs daily. 11/14/18   [provider]  Iron-FA-B Cmp-C-Biot-Probiotic (FUSION PLUS) CAPS Take 1 capsule by mouth at bedtime.  10/09/16   [provider]  levothyroxine (SYNTHROID, LEVOTHROID) 150 MCG tablet Take 150 mcg by mouth daily before breakfast.    [provider]  LORazepam (ATIVAN PO) Take by mouth.    [provider]  MAGNESIUM-OXIDE 400 (241.3 Mg) MG tablet Take 400 mg by mouth daily. 06/18/18  [provider]  nitroGLYCERIN (NITROSTAT) 0.4 MG SL tablet Place 1 tablet (0.4 mg total) under the tongue every 5 (five) minutes as needed for chest pain. 07/19/18   Revankar, Reita Cliche, MD  OXYGEN Place 2 L into the nose continuous.     [provider]  polyethylene glycol (MIRALAX / GLYCOLAX) packet Take 17 g by mouth daily. Patient taking differently: Take 17 g by mouth daily as needed for mild constipation.  12/27/17   Masoudi, Elhamalsadat, MD  potassium chloride 20 MEQ TBCR Take 10 mEq by mouth daily. 11/18/18   Swayze, Ava, DO  pramipexole (MIRAPEX) 1 MG tablet Take 1 mg by mouth 2 (two) times a day.    [provider]  sacubitril-valsartan (ENTRESTO) 24-26 MG Take 1 tablet by mouth 2 (two) times daily. 11/22/18   Loel Dubonnet, NP  tiZANidine (ZANAFLEX) 4 MG tablet Take 4 mg by mouth 2 (two) times daily as needed for muscle spasms. 11/12/18   [provider]  TRELEGY ELLIPTA 100-62.5-25 MCG/INH AEPB Inhale 1 puff into the lungs daily. 11/14/18   [provider]  TRULICITY 1.5 0000000 SOPN Inject 1.5 mg into the skin every Sunday.  10/10/16   [provider]  zolpidem (AMBIEN) 5 MG tablet Take 5 mg by mouth at  bedtime as needed for sleep. 11/13/18   [provider]    Family History Family History  Problem Relation Age of Onset   Stroke Mother    Diabetes Father    Diabetes Sister    Diabetes Sister    Diabetes Son    Colon cancer Neg Hx    Esophageal cancer Neg Hx     Social History Social History   Tobacco Use   Smoking status: Current Some Day Smoker    Packs/day: 1.00    Years: 44.00    Pack years: 44.00    Types: Cigarettes    Last attempt to quit: 11/15/2018    Years since quitting: 0.0   Smokeless tobacco: Never Used   Tobacco comment: needs a patch  Substance Use Topics   Alcohol use: Yes    Alcohol/week: 7.0 standard drinks    Types: 7 Shots of liquor per week    Comment: 1 drink a day at the most   Drug use: Not Currently    Types: Marijuana    Comment: 09/05/18 "about a month ago, my granddaughter set me up on something"     Allergies   Hydroxychloroquine, Donepezil, Prednisone, Anticoagulant cit dext [acd formula a], Bupropion, Metrizamide, Varenicline, and Tape   Review of Systems Review of Systems  Constitutional: Negative for appetite change, chills and fever.  HENT: Negative for ear pain, rhinorrhea, sneezing and sore throat.   Eyes: Negative for photophobia and visual disturbance.  Respiratory: Negative for cough, chest tightness, shortness of breath and wheezing.   Cardiovascular: Negative for chest pain and palpitations.  Gastrointestinal: Negative for abdominal pain, blood in stool, constipation, diarrhea, nausea and vomiting.  Genitourinary: Negative for dysuria, hematuria and urgency.  Musculoskeletal: Positive for myalgias. Negative for arthralgias.  Skin: Negative for rash.  Neurological: Negative for dizziness, weakness and light-headedness.     Physical Exam Updated Vital Signs BP (!) 106/56 (BP Location: Right Arm)    Pulse 68    Temp 99 F (37.2 C) (Oral)    Resp 16    Ht 5\' 2"  (1.575 m)    Wt 106.6 kg    SpO2 100%     BMI  42.98 kg/m   Physical Exam Vitals signs and nursing note reviewed.  Constitutional:      General: She is not in acute distress.    Appearance: She is well-developed. She is obese.     Comments: Speaking sentences without difficulty.  3 L of oxygen via nasal cannula.  HENT:     Head: Normocephalic and atraumatic.     Nose: Nose normal.  Eyes:     General: No scleral icterus.       Right eye: No discharge.        Left eye: No discharge.     Conjunctiva/sclera: Conjunctivae normal.  Neck:     Musculoskeletal: Normal range of motion and neck supple.  Cardiovascular:     Rate and Rhythm: Normal rate and regular rhythm.     Heart sounds: Normal heart sounds. No murmur. No friction rub. No gallop.   Pulmonary:     Effort: Pulmonary effort is normal. No respiratory distress.     Breath sounds: Normal breath sounds.  Abdominal:     General: Bowel sounds are normal. There is no distension.     Palpations: Abdomen is soft.     Tenderness: There is no abdominal tenderness. There is no guarding.  Musculoskeletal: Normal range of motion.        General: Tenderness present. No signs of injury.       Legs:     Comments: TTP of the indicated area of the R groin with redness of skin noted. Continued TTP of entire R lower extremity. Faint palpable DP pulse, although symmetric with duplex. FROM of ankle and digits.  Skin:    General: Skin is warm and dry.     Findings: Erythema present. No rash.  Neurological:     Mental Status: She is alert.     Motor: No abnormal muscle tone.     Coordination: Coordination normal.      ED Treatments / Results  Labs (all labs ordered are listed, but only abnormal results are displayed) Labs Reviewed  BASIC METABOLIC PANEL - Abnormal; Notable for the following components:      Result Value   Glucose, Bld 101 (*)    BUN 30 (*)    Creatinine, Ser 1.09 (*)    GFR calc non Af Amer 54 (*)    All other components within normal limits  CBC WITH  DIFFERENTIAL/PLATELET - Abnormal; Notable for the following components:   WBC 16.0 (*)    Neutro Abs 12.8 (*)    Monocytes Absolute 1.2 (*)    Abs Immature Granulocytes 0.12 (*)    All other components within normal limits  CK - Abnormal; Notable for the following components:   Total CK 28 (*)    All other components within normal limits  URINALYSIS, ROUTINE W REFLEX MICROSCOPIC - Abnormal; Notable for the following components:   Specific Gravity, Urine >1.046 (*)    All other components within normal limits  I-STAT CHEM 8, ED - Abnormal; Notable for the following components:   BUN 35 (*)    Creatinine, Ser 1.10 (*)    All other components within normal limits  TROPONIN I (HIGH SENSITIVITY) - Abnormal; Notable for the following components:   Troponin I (High Sensitivity) 32 (*)    All other components within normal limits  TROPONIN I (HIGH SENSITIVITY) - Abnormal; Notable for the following components:   Troponin I (High Sensitivity) 34 (*)    All other components within normal limits  EKG None  Radiology Ct Angio Aortobifemoral W And/or Wo Contrast  Result Date: 11/27/2018 CLINICAL DATA:  63 year old right leg pain. EXAM: CT ANGIOGRAPHY OF ABDOMINAL AORTA WITH ILIOFEMORAL RUNOFF TECHNIQUE: Multidetector CT imaging of the abdomen, pelvis and lower extremities was performed using the standard protocol during bolus administration of intravenous contrast. Multiplanar CT image reconstructions and MIPs were obtained to evaluate the vascular anatomy. CONTRAST:  140mL OMNIPAQUE IOHEXOL 350 MG/ML SOLN COMPARISON:  CT 07/11/2018 FINDINGS: VASCULAR Aorta: Atherosclerotic calcifications in the abdominal aorta without aneurysm or significant stenosis. Celiac: Celiac trunk is patent. Splenic artery, left gastric artery and common hepatic artery are patent. Incidentally, there is partially replaced left hepatic artery. Replaced right hepatic artery coming from the SMA. SMA: Mild atherosclerotic  disease in the proximal SMA without significant stenosis. Replaced right hepatic artery is patent. Main branches of the SMA are patent. Renals: Single bilateral renal arteries. Bilateral renal arteries are patent without significant stenosis. IMA: IMA is patent. RIGHT Lower Extremity Inflow: Calcified plaque in the right common iliac artery with at least mild stenosis. Right internal iliac artery is heavily calcified with stenosis. Right external iliac artery is heavily calcified but patent. Outflow: Right common femoral artery is small with some calcified plaque. No significant stenosis. Right profunda femoral arteries are patent. Proximal and mid right SFA are patent without significant stenosis. Plaque and at least mild stenosis in the distal SFA at the adductor canal. Mild-to-moderate stenosis in the proximal popliteal artery. Popliteal artery is patent. Runoff: Three-vessel runoff to the right ankle. Posterior tibial artery is patent across the ankle. No significant flow in the dorsalis pedis artery. LEFT Lower Extremity Inflow: Left common iliac artery is occluded at the origin. Occlusion is chronic based on a CT from 12/24/2017. Chronic occlusion at the origin of the left internal iliac artery. Chronic occlusion of the left external iliac artery. Outflow: Reconstitution the left common femoral artery with calcified plaque. Left profunda femoral arteries are patent. Atherosclerotic disease in the proximal and distal left SFA without an occlusion. Left popliteal artery small with calcifications and at least mild stenosis. Left popliteal artery is patent. Runoff: Three-vessel runoff to the ankle. Posterior tibial artery is patent across the ankle. No significant flow in the dorsalis pedis artery. Veins: No obvious venous abnormality within the limitations of this arterial phase study. Review of the MIP images confirms the above findings. NON-VASCULAR Lower chest: Limited evaluation of the lung bases. No large  pleural effusions. Hepatobiliary: Liver is only partially visualized. Cholecystectomy. No significant biliary dilatation. Pancreas: Unremarkable. No pancreatic ductal dilatation or surrounding inflammatory changes. Spleen: Normal in size without focal abnormality. Entire spleen is not imaged. Adrenals/Urinary Tract: Normal appearance of the adrenal glands. Question low-density cyst along the left kidney lower pole but there is mild perinephric stranding in this area. Negative for hydronephrosis. Nonobstructive 5 mm stone in the right kidney lower pole. This kidney stone appears to be a chronic since 2019. Urinary bladder is decompressed. Stomach/Bowel: Visualized stomach is unremarkable. Normal appearance of the duodenum. Again noted is laxity along the anterior wall with a right paracentral ventral hernia in the upper abdomen containing loops of small bowel. No evidence for bowel dilatation or focal bowel inflammation. Lymphatic: No lymph node enlargement in the abdomen or pelvis. Reproductive: Uterus and bilateral adnexa are unremarkable. Other: Asymmetry along the right anterior abdominal musculature, right side larger than left. There was a similar finding on the exam from 11/10/2018 but there has been slight enlargement of the right  anterior abdominal wall thickening measuring roughly 1.4 cm and previously measured 1.1 cm. Cannot exclude a small amount of fluid or prior hematoma in this area. Retroperitoneal stranding is similar to the prior examination. No free fluid. Negative for free air. Musculoskeletal: No acute bone abnormality. Peripherally calcified lesion in the proximal left tibia. Lesion roughly measures 3.9 cm in the craniocaudal dimension. There is also a poorly defined sclerotic lesion in the distal femur roughly measuring up to 4.7 cm. These lesions were present on a prior MRI from 12/18/2013 and compatible with old bone infarcts. IMPRESSION: VASCULAR 1. Chronic left inflow disease. Chronic  occlusion of the left common iliac artery, left internal iliac artery and left external iliac artery. 2. Stenosis and diffuse calcifications involving the right inflow vessels without occlusion. Disease in the right iliac arterial system could be hemodynamically significant. 3. Mild outflow disease bilaterally. No focal occlusions involving the outflow vessels. 4. No significant runoff disease. Three-vessel runoff to both ankles. 5. Replaced hepatic arteries as described. NON-VASCULAR 1. No acute abnormality in the abdomen or pelvis. 2. Abdominal wall laxity with a right paracentral ventral hernia containing small bowel. No evidence for bowel obstruction or acute bowel inflammation. 3. Asymmetric enlargement of the right abdominal wall musculature compared to the left. This finding has minimally progressed since April 2020. Findings could be related to small amount of fluid or prior hematoma. 4. Nonobstructive right kidney stone. Electronically Signed   By: Markus Daft M.D.   On: 11/27/2018 12:26   Vas [place] Lower Extremity Venous (dvt) (only Mc & Wl 7a-7p)  Result Date: 11/27/2018  Lower Venous Study Indications: Right thigh pain and swelling.  Comparison Study: 07/20/18 negative Performing Technologist: June Leap RDMS, RVT  Examination Guidelines: A complete evaluation includes B-mode imaging, spectral Doppler, color Doppler, and power Doppler as needed of all accessible portions of each vessel. Bilateral testing is considered an integral part of a complete examination. Limited examinations for reoccurring indications may be performed as noted.  +---------+---------------+---------+-----------+----------+--------------+  RIGHT     Compressibility Phasicity Spontaneity Properties Thrombus Aging  +---------+---------------+---------+-----------+----------+--------------+  CFV       Full            Yes       Yes                                     +---------+---------------+---------+-----------+----------+--------------+  SFJ       Full                                                             +---------+---------------+---------+-----------+----------+--------------+  FV Prox   Full                                                             +---------+---------------+---------+-----------+----------+--------------+  FV Mid    Full                                                             +---------+---------------+---------+-----------+----------+--------------+  FV Distal Full                                                             +---------+---------------+---------+-----------+----------+--------------+  PFV       Full                                                             +---------+---------------+---------+-----------+----------+--------------+  POP       Full            Yes       Yes                                    +---------+---------------+---------+-----------+----------+--------------+  PTV       Full                                                             +---------+---------------+---------+-----------+----------+--------------+  PERO      Full                                                             +---------+---------------+---------+-----------+----------+--------------+   Right Technical Findings: Right LE arteries visualized and patent with normal flow.    Summary: Right: There is no evidence of deep vein thrombosis in the lower extremity. No cystic structure found in the popliteal fossa.  *See table(s) above for measurements and observations. Electronically signed by Harold Barban MD on 11/27/2018 at 3:17:50 PM.    Final     Procedures Procedures (including critical care time)  Medications Ordered in ED Medications  morphine 4 MG/ML injection 4 mg (4 mg Intravenous Given 11/27/18 0913)  iohexol (OMNIPAQUE) 350 MG/ML injection 100 mL (100 mLs Intravenous Contrast Given 11/27/18 0942)  morphine 4 MG/ML  injection 4 mg (4 mg Intravenous Given 11/27/18 1307)     Initial Impression / Assessment and Plan / ED Course  I have reviewed the triage vital signs and the nursing notes.  Pertinent labs & imaging results that were available during my care of the patient were reviewed by me and considered in my medical decision making (see chart for details).  Clinical Course as of Nov 26 1637  Tue Nov 27, 2018  1120 I have called CT tech regarding images of CTA not uploaded yet.  States that this is a new system that they are using and that they are having trouble with it.  She will continue to work on the issue.   [HK]  [id number] This is my second time speaking to the vascular tech regarding vascular imaging of the right lower extremity.  They state that they will not be able  to complete the arterial duplex due to the CTA which is the gold standard.  I then went back in the room while the tech is doing the vascular study and she states that her arterial flow appears patent and that she will documented during the DVT study.  Stressed to her that the CT images have not resulted yet and that there is concern for blockage.   [HK]  1209 Results from vascular study: Summary: Right: There is no evidence of deep vein thrombosis in the lower extremity. No cystic structure found in the popliteal fossa.  Right LE arteries visualized and patent with normal flow.   [HK]  21 Spoke to Dr. Trula Slade of vascular surgery.  Based on CT results showing no occlusion but does show vascular disease, he does not feel that this is the cause of the patient's pain does not feel that heparin is necessary.  However he will be down to evaluate the patient.   [HK]    Clinical Course User Index [HK] Delia Heady, PA-C       63 year old female with past medical history of stage III CKD, COPD, diabetes, CAD, pseudoaneurysm and right groin in July 2019 repaired with compression presents to ED for acute onset of right sided groin pain.   States that this feels similar to when she had the pseudoaneurysm for cannulization site after coronary angioplasty last year.  She states that this is causing her to be short of breath but has been on her usual 3 L of oxygen at baseline by nasal cannula.  She is speaking complete sentence without difficulty.  Tenderness palpation throughout the entire right lower extremity.  Faint but symmetrical DP pulses, able to be heard by Doppler. Some erythema of upper thigh area noted.  Lab work significant for leukocytosis of 16, normal CK and BMP.  Troponin 32, delta check 34.  CT angio aortobifemoral with stenosis, chronic vascular disease but no signs of occlusion.  Vascular ultrasound, venous and arterial are both negative for acute abnormality.  Patient was evaluated by Dr. Trula Slade of vascular surgery.  He does not feel that her pain is from a vascular source, no need for anticoagulation or other intervention at this time.  States that patient can follow-up with him in the clinic.  Patient's pain controlled here, able to ambulate to the bathroom.  This could be related to her neuropathy, doubt infectious cause of symptoms.  She is comfortable with PCP follow-up as well as vascular surgery follow-up. Question intermediate claudication, vascular to complete ABIs in clinic.  Return precautions given.  Patient is hemodynamically stable, in NAD, and able to ambulate in the ED. Evaluation does not show pathology that would require ongoing emergent intervention or inpatient treatment. I explained the diagnosis to the patient. Pain has been managed and has no complaints prior to discharge. Patient is comfortable with above plan and is stable for discharge at this time. All questions were answered prior to disposition. Strict return precautions for returning to the ED were discussed. Encouraged follow up with PCP.   An After Visit Summary was printed and given to the patient.   Portions of this note were generated with  [employer]. Dictation errors may occur despite best attempts at proofreading.   Final Clinical Impressions(s) / ED Diagnoses   Final diagnoses:  Peripheral arterial disease [facility])    ED Discharge Orders         Ordered    HYDROcodone-acetaminophen (NORCO/VICODIN) 5-325 MG tablet  Every 6  hours PRN     11/27/18 1459           Delia Heady, PA-C 11/27/18 1639    Lucrezia Starch, MD 11/29/18 1120

## 2018-11-27 NOTE — ED Triage Notes (Signed)
Patient arrived via [facility] with complaints of new onset upper right leg pain. Awakened from sleep in pain. Pian causing shortness of breath. Redness present in upper thigh area, with warmth in right thigh. Painful to touch. Reports breathing stable at this time, On 3L home O2. History of femoral artery bleed and nerve pain in leg.

## 2018-11-27 NOTE — ED Notes (Signed)
Pt dc'd home with all belongings, a/o x4, driven home by spouse

## 2018-11-27 NOTE — ED Notes (Signed)
ED Provider at bedside. 

## 2018-11-27 NOTE — Discharge Instructions (Signed)
Please follow-up with Dr. Stephens Sh[facility] office listed below regarding your vascular disease. Take pain medication as needed. Follow-up with your primary care provider. Return to the ED if you start to have worsening symptoms, temperature changes in your legs, injuries or falls, losing control of your bowels or bladder, numbness in legs.

## 2018-11-27 NOTE — Progress Notes (Signed)
Patient at this time no longer needs PIV started per RN if needs 2nd site will replace consult

## 2018-11-27 NOTE — ED Notes (Signed)
Patient ambulated in hall to bathroom, one person assist. Tolerated ambulation well.

## 2018-11-27 NOTE — ED Triage Notes (Addendum)
Pt presents with Right leg pain, seen here today for the same, states she was negative for a blood clot, dc'd home with new RX for hydrocodone. Pt states that medication didn't work.

## 2018-11-27 NOTE — ED Notes (Signed)
Patient transported to CT 

## 2018-11-28 MED ORDER — OXYCODONE HCL 5 MG PO TABS
5.0000 mg | ORAL_TABLET | Freq: Once | ORAL | Status: AC
  Administered 2018-11-28: 5 mg via ORAL
  Filled 2018-11-28: qty 1

## 2018-11-28 MED ORDER — MORPHINE SULFATE (PF) 4 MG/ML IV SOLN
4.0000 mg | Freq: Once | INTRAVENOUS | Status: AC
  Administered 2018-11-28: 4 mg via INTRAVENOUS
  Filled 2018-11-28: qty 1

## 2018-11-28 MED ORDER — OXYCODONE HCL 5 MG PO TABS: 5.0000 mg | ORAL_TABLET | Freq: Four times a day (QID) | ORAL | 0 refills | Status: DC | PRN

## 2018-11-28 NOTE — ED Notes (Signed)
Patient verbalizes understanding of discharge instructions. Opportunity for questioning and answers were provided. Armband removed by staff, pt discharged from ED.  

## 2018-11-28 NOTE — ED Provider Notes (Signed)
[facility] EMERGENCY DEPARTMENT Provider Note   CSN: [id number] Arrival date & time: 11/27/18  2156     History   Chief Complaint Chief Complaint  Patient presents with   Leg Pain    HPI Crystal Yates is a 63 y.o. female with a history of COPD on 2 L home oxygen 24/7, Shervin syndrome chronic combined systolic and diastolic heart failure, CAD s/p LAD stenting and RCA stenting x2, thyroid disease, secondary adrenal insufficiency, HTN, HLD, Hashimoto's thyroiditis, and tobacco abuse who presents to the emergency department with a chief complaint of right groin pain.  The patient was seen earlier in the day for right groin pain.  She reports the pain awoke her from sleep last night at approximately 1 AM.  She reports that the sharp, stabbing pain radiates from her right groin down her right thigh to her knee.  She reports that she has had difficulty walking for most of the day due to the pain and has required assistance from her husband.  She does note that she has a walker at home, but she does not typically use this and has not had to use it today.  No other known aggravating or alleviating factors.  The patient was seen in the ER earlier in the day and had an extensive work-up.  She had a pseudoaneurysm to the right groin and July 2019 and states that the current pain feels similar to when she has a pseudo-aneurysm over the cannulation site after having coronary angioplasty in 2019.  She was evaluated by vascular surgery and had a CT angios of the abdominal aorta with iliofemoral runoff that did not show any evidence of acute vascular injury or occlusions as the source of her acute right thigh pain.  She was discharged to home with outpatient vascular surgery follow-up with Vicodin for pain control.  She reports that she took 1 dose of medication at 1800, but her pain has remained very uncontrolled.  She reports that typically in the past that she is only been able  to obtain pain control with morphine and Dilaudid IV and has not taken a significant amount of oral pain medication.  She denies fever, chills, abdominal pain, nausea, vomiting, diarrhea, constipation, genitourinary complaints, numbness, or weakness.  She does report that she did have a fall approximately 3 weeks ago where she hit her left shoulder and left leg, but does not recall hitting her right lower extremity.  She has been ambulatory for the last few weeks without difficulty.  Of note, patient was recently hospitalized from August 19-23 for acute respiratory failure requiring endotracheal intubation.  He was discharged on dexamethasone.     The history is provided by the patient. No language interpreter was used.    Past Medical History:  Diagnosis Date   Acute on chronic systolic heart failure exacerbation(HCC) 04/08/2016   Arthritis    CAD in native artery    a. Prior LAD stenting based on cath. b. RCA stenting 03/2016 x2.   Chronic combined systolic and diastolic CHF (congestive heart failure) (HCC)    CKD (chronic kidney disease), stage II    COPD (chronic obstructive pulmonary disease) (HCC)    Diabetes mellitus without complication ([place])    Hashimoto's thyroiditis    History of blood transfusion    Hyperlipidemia    Hypertension    On home oxygen therapy    "2L; 24/7" (10/23/2017)   Secondary adrenal insufficiency (HCC)    Thyroid disease  Tobacco abuse     Patient Active Problem List   Diagnosis Date Noted   Metabolic acidosis, NAG, bicarbonate losses    Acute respiratory failure ([place]) 11/14/2018   Sepsis due to pneumonia ([place]) 09/05/2018   Coagulation defect ([place]) 08/23/2018   Ventral hernia 08/06/2018   Dyspnea [id number]   Acute diastolic CHF (congestive heart failure) ([place]) 07/19/2018   Hypoglycemia 07/10/2018   Abdominal pain 07/10/2018   Alcohol use 07/10/2018   Type II diabetes mellitus with renal manifestations ([place])  07/10/2018   CKD (chronic kidney disease) stage 3, GFR 30-59 ml/min (HCC) 07/10/2018   Chronic combined systolic (congestive) and diastolic (congestive) heart failure ([place]) 07/10/2018   COPD exacerbation ([place]) 07/10/2018   Suspected Covid-19 Virus Infection 07/10/2018   Chronic respiratory failure with hypoxia and hypercapnia ([place]) 02/01/2018   Cigarette smoker 02/01/2018   Chronic obstructive pulmonary disease ([place]) 01/31/2018   Internal and external bleeding hemorrhoids 01/22/2018   Sessile colonic polyp 01/22/2018   Diverticulosis of colon without hemorrhage 01/22/2018   Iron deficiency 01/11/2018   Acute on chronic respiratory failure with hypoxia and hypercapnia ([place]) 01/04/2018   AKI (acute kidney injury) ([place])    Respiratory crackles at left lung base    Morbid obesity ([place]) 12/29/2017   Acute on chronic heart failure ([place]) 12/27/2017   Hyperglycemia    Coronary stent restenosis due to progression of disease    Pseudoaneurysm following procedure ([place])    Pseudoaneurysm ([place]) 10/17/2017   S/P angioplasty for ISR of dRCA 10/13/17 10/14/2017   CAD (coronary artery disease) 10/13/2017   Right upper lobe pneumonia ([place]) 05/06/2017   Pneumonia due to respiratory syncytial virus (RSV) 04/21/2017   Entrapment, radial nerve, right 03/31/2017   COPD without exacerbation ([place]) 10/14/2016   Acute on chronic combined systolic and diastolic heart failure ([place]) 10/13/2016   Chest tightness or pressure 10/12/2016   Dyspnea on exertion 09/29/2016   History of coronary artery disease 09/29/2016   OAB (overactive bladder) 09/21/2016   Neck pain 09/08/2016   Paresthesia of arm 09/08/2016   Flank pain 08/31/2016   Neuralgia of right upper extremity 04/26/2016   Coronary artery disease 04/16/2016   Lymphocele after surgical procedure 04/16/2016   Ventricular tachycardia (paroxysmal) ([place]) 04/09/2016   Acute on chronic HFrEF (heart failure with reduced  ejection fraction) ([place]) 04/08/2016   Ischemic cardiomyopathy 04/08/2016   Anemia, blood loss 09/16/2015   GI bleeding 09/16/2015   Chest pain 05/22/2015   Hypothyroid 05/22/2015   Status post hernia repair 01/29/2015   Surgical wound breakdown 01/29/2015   Hernia with strangulation 01/18/2015   Tobacco abuse 01/18/2015   Recurrent incisional hernia with incarceration 01/15/2015   Sleep apnea 01/15/2015   S/P drug eluting coronary stent placement 10/16/2014   Adrenal insufficiency ([place]) 11/21/2013   Anemia 11/21/2013   Unstable angina (HCC) 11/21/2013   Atherosclerotic heart disease of native coronary artery without angina pectoris 11/21/2013   Cardiomyopathy ([place]) 11/21/2013   Edema 11/21/2013   Empty sella syndrome ([place]) 11/21/2013   Hepatitis 11/21/2013   Hyperlipemia 11/21/2013   Hypertension 11/21/2013   Kidney cyst, acquired 11/21/2013   Fat necrosis 11/21/2013   Pulmonary emphysema ([place]) 11/21/2013   Sjogrens syndrome ([place]) 11/21/2013   Nicotine dependence, uncomplicated [id number]   Major depressive disorder, recurrent episode ([place]) 09/17/2013   Displacement of lumbar intervertebral disc 06/14/2013   Intractable migraine without aura 06/14/2013   Mild cognitive impairment 06/14/2013   Polyneuropathy in diabetes ([place]) 06/14/2013   Secondary adrenal insufficiency ([place]) 04/12/2011  DDD (degenerative disc disease), lumbosacral 01/11/2008   Thoracic or lumbosacral neuritis or radiculitis 01/11/2008    Past Surgical History:  Procedure Laterality Date   ABDOMINAL SURGERY     CESAREAN SECTION     CHOLECYSTECTOMY     COLON RESECTION     COLONOSCOPY WITH PROPOFOL N/A 01/16/2018   Procedure: COLONOSCOPY WITH PROPOFOL;  Surgeon: Irving Copas., MD;  Location: [facility];  Service: Gastroenterology;  Laterality: N/A;   CORONARY BALLOON ANGIOPLASTY N/A 10/13/2017   Procedure: CORONARY BALLOON ANGIOPLASTY;  Surgeon: Belva Crome, MD;  Location: [facility];  Service: Cardiovascular;  Laterality: N/A;   HERNIA MESH REMOVAL     HERNIA REPAIR     LEFT HEART CATH AND CORONARY ANGIOGRAPHY N/A 10/13/2016   Procedure: Left Heart Cath and Coronary Angiography;  Surgeon: Nelva Bush, MD;  Location: [facility];  Service: Cardiovascular;  Laterality: N/A;   POLYPECTOMY  01/16/2018   Procedure: POLYPECTOMY;  Surgeon: Rush Landmark Telford Nab., MD;  Location: [facility] ENDOSCOPY;  Service: Gastroenterology;;   RIGHT/LEFT HEART CATH AND CORONARY ANGIOGRAPHY N/A 10/13/2017   Procedure: RIGHT/LEFT HEART CATH AND CORONARY ANGIOGRAPHY;  Surgeon: Belva Crome, MD;  Location: [facility];  Service: Cardiovascular;  Laterality: N/A;   SHOULDER ARTHROSCOPY     TUBAL LIGATION       OB History   No obstetric history on file.      Home Medications    Prior to Admission medications   Medication Sig Start Date End Date Taking? Authorizing Provider  acetaminophen (TYLENOL) 500 MG tablet Take 1 tablet (500 mg total) by mouth every 6 (six) hours as needed for mild pain. 07/11/18   Mariel Aloe, MD  albuterol (PROAIR HFA) 108 (90 Base) MCG/ACT inhaler Inhale 2 puffs into the lungs every 4-6 hours as needed for shortness of breath or wheezing Patient taking differently: Inhale 2 puffs into the lungs every 4 (four) hours as needed for wheezing or shortness of breath.  01/31/18   Tanda Rockers, MD  albuterol (PROVENTIL) (2.5 MG/3ML) 0.083% nebulizer solution Take 3 mLs (2.5 mg total) by nebulization every 6 (six) hours as needed for wheezing or shortness of breath. 02/28/18   Tanda Rockers, MD  Amino Acids-Protein Hydrolys (FEEDING SUPPLEMENT, PRO-STAT SUGAR FREE 64,) LIQD Take 30 mLs by mouth 2 (two) times daily. 11/18/18   Swayze, Ava, DO  aspirin EC 81 MG EC tablet Take 1 tablet (81 mg total) by mouth daily. 08/10/18   Kayleen Memos, DO  atorvastatin (LIPITOR) 80 MG tablet Take 80 mg by mouth every evening.   06/18/18   [provider]  CALCIUM POLYCARBOPHIL PO Take 3 tablets by mouth daily.    [provider]  Cholecalciferol (VITAMIN D3) 2000 units capsule Take 2,000 Units by mouth daily.     [provider]  clopidogrel (PLAVIX) 75 MG tablet Take 1 tablet (75 mg total) by mouth daily. Patient taking differently: Take 75 mg by mouth at bedtime.  01/20/18   Masoudi, Elhamalsadat, MD  dexamethasone (DECADRON) 1 MG tablet Take 1 tablet (1 mg total) by mouth daily for 5 days, THEN 0.5 tablets (0.5 mg total) daily for 5 days. 11/28/18 12/08/18  Swayze, Ava, DO  dexamethasone (DECADRON) 4 MG tablet Take 1 tablet (4 mg total) by mouth daily for 5 days, THEN 0.5 tablets (2 mg total) daily for 5 days. 11/18/18 11/28/18  Swayze, Ava, DO  DULoxetine (CYMBALTA) 60 MG capsule Take  120 mg by mouth daily.  12/05/16   [provider]  EMGALITY 120 MG/ML SOAJ Take 120 mg by mouth every 30 (thirty) days. 10/24/18   [provider]  furosemide (LASIX) 40 MG tablet Take 1 tablet (40 mg total) by mouth 2 (two) times daily. Make take one additional tablet for weight gain of 3 lbs overnight or 5 lbs in one week. 11/22/18   Loel Dubonnet, NP  HYDROcodone-acetaminophen (NORCO/VICODIN) 5-325 MG tablet Take 1 tablet by mouth every 6 (six) hours as needed. 11/27/18   Khatri, Hina, PA-C  hydrocortisone (CORTEF) 5 MG tablet Take 5-15 mg by mouth See admin instructions. Take 3 tablets in the morning and take 1 tablet in the evening    [provider]  insulin degludec (TRESIBA FLEXTOUCH) 100 UNIT/ML SOPN FlexTouch Pen Inject 42 Units into the skin at bedtime.  08/28/16   [provider]  ipratropium-albuterol (DUONEB) 0.5-2.5 (3) MG/3ML SOLN Inhale 3 mLs into the lungs daily. 11/14/18   [provider]  Iron-FA-B Cmp-C-Biot-Probiotic (FUSION PLUS) CAPS Take 1 capsule by mouth at bedtime.  10/09/16   [provider]  levothyroxine (SYNTHROID, LEVOTHROID) 150 MCG  tablet Take 150 mcg by mouth daily before breakfast.    [provider]  LORazepam (ATIVAN PO) Take by mouth.    [provider]  MAGNESIUM-OXIDE 400 (241.3 Mg) MG tablet Take 400 mg by mouth daily. 06/18/18   [provider]  nitroGLYCERIN (NITROSTAT) 0.4 MG SL tablet Place 1 tablet (0.4 mg total) under the tongue every 5 (five) minutes as needed for chest pain. 07/19/18   Revankar, Reita Cliche, MD  oxyCODONE (ROXICODONE) 5 MG immediate release tablet Take 1 tablet (5 mg total) by mouth every 6 (six) hours as needed for severe pain. 11/28/18   Wednesday Ericsson A, PA-C  OXYGEN Place 2 L into the nose continuous.     [provider]  polyethylene glycol (MIRALAX / GLYCOLAX) packet Take 17 g by mouth daily. Patient taking differently: Take 17 g by mouth daily as needed for mild constipation.  12/27/17   Masoudi, Elhamalsadat, MD  potassium chloride 20 MEQ TBCR Take 10 mEq by mouth daily. 11/18/18   Swayze, Ava, DO  pramipexole (MIRAPEX) 1 MG tablet Take 1 mg by mouth 2 (two) times a day.    [provider]  sacubitril-valsartan (ENTRESTO) 24-26 MG Take 1 tablet by mouth 2 (two) times daily. 11/22/18   Loel Dubonnet, NP  tiZANidine (ZANAFLEX) 4 MG tablet Take 4 mg by mouth 2 (two) times daily as needed for muscle spasms. 11/12/18   [provider]  TRELEGY ELLIPTA 100-62.5-25 MCG/INH AEPB Inhale 1 puff into the lungs daily. 11/14/18   [provider]  TRULICITY 1.5 0000000 SOPN Inject 1.5 mg into the skin every Sunday.  10/10/16   [provider]  zolpidem (AMBIEN) 5 MG tablet Take 5 mg by mouth at bedtime as needed for sleep. 11/13/18   [provider]    Family History Family History  Problem Relation Age of Onset   Stroke Mother    Diabetes Father    Diabetes Sister    Diabetes Sister    Diabetes Son    Colon cancer Neg Hx    Esophageal cancer Neg Hx     Social History Social History   Tobacco Use   Smoking  status: Current Some Day Smoker    Packs/day: 1.00    Years: 44.00    Pack years:  44.00    Types: Cigarettes    Last attempt to quit: 11/15/2018    Years since quitting: 0.0   Smokeless tobacco: Never Used   Tobacco comment: needs a patch  Substance Use Topics   Alcohol use: Yes    Alcohol/week: 7.0 standard drinks    Types: 7 Shots of liquor per week    Comment: 1 drink a day at the most   Drug use: Not Currently    Types: Marijuana    Comment: 09/05/18 "about a month ago, my granddaughter set me up on something"     Allergies   Hydroxychloroquine, Donepezil, Prednisone, Anticoagulant cit dext [acd formula a], Bupropion, Metrizamide, Varenicline, and Tape   Review of Systems Review of Systems  Constitutional: Negative for activity change, chills and fever.  Respiratory: Negative for shortness of breath.   Cardiovascular: Negative for chest pain.  Gastrointestinal: Negative for abdominal pain, diarrhea, nausea and vomiting.  Genitourinary: Negative for dysuria.       Right groin pain  Musculoskeletal: Positive for arthralgias, gait problem and myalgias. Negative for back pain, joint swelling, neck pain and neck stiffness.  Skin: Negative for rash.  Allergic/Immunologic: Negative for immunocompromised state.  Neurological: Negative for dizziness, seizures, syncope, weakness, numbness and headaches.  Psychiatric/Behavioral: Negative for confusion.     Physical Exam Updated Vital Signs BP 136/65 (BP Location: Right Arm)    Pulse 85    Temp 98.3 F (36.8 C) (Oral)    Resp 20    SpO2 96%   Physical Exam Vitals signs and nursing note reviewed.  Constitutional:      General: She is not in acute distress. HENT:     Head: Normocephalic.  Eyes:     Conjunctiva/sclera: Conjunctivae normal.  Neck:     Musculoskeletal: Neck supple.  Cardiovascular:     Rate and Rhythm: Normal rate and regular rhythm.     Heart sounds: No murmur. No friction rub. No gallop.   Pulmonary:       Effort: Pulmonary effort is normal. No respiratory distress.  Abdominal:     General: There is no distension.     Palpations: Abdomen is soft.     Tenderness: There is no abdominal tenderness. There is no right CVA tenderness or guarding.     Comments: Large scar secondary to previous ventral hernia repair.  No focal tenderness to palpation.  Tender to palpation in the right pelvic region without rebound or guarding.  Genitourinary:    Comments: Tender to palpation in the right inguinal fold.  No palpable inguinal lymphadenopathy bilaterally.  No focal redness, warmth, or edema. Musculoskeletal:     Comments: Diffusely tender to palpation over the right hip, thigh, and knee.  Increased pain with flexion of the right knee.  Neurovascularly intact to the bilateral lower extremities.  Skin:    General: Skin is warm.     Findings: No rash.  Neurological:     Mental Status: She is alert.  Psychiatric:        Behavior: Behavior normal.      ED Treatments / Results  Labs (all labs ordered are listed, but only abnormal results are displayed) Labs Reviewed - No data to display  EKG None  Radiology Ct Angio Aortobifemoral W And/or Wo Contrast  Result Date: 11/27/2018 CLINICAL DATA:  63 year old right leg pain. EXAM: CT ANGIOGRAPHY OF ABDOMINAL AORTA WITH ILIOFEMORAL RUNOFF TECHNIQUE: Multidetector CT imaging of the abdomen, pelvis and lower extremities was performed using the standard protocol  during bolus administration of intravenous contrast. Multiplanar CT image reconstructions and MIPs were obtained to evaluate the vascular anatomy. CONTRAST:  110mL OMNIPAQUE IOHEXOL 350 MG/ML SOLN COMPARISON:  CT 07/11/2018 FINDINGS: VASCULAR Aorta: Atherosclerotic calcifications in the abdominal aorta without aneurysm or significant stenosis. Celiac: Celiac trunk is patent. Splenic artery, left gastric artery and common hepatic artery are patent. Incidentally, there is partially replaced left hepatic  artery. Replaced right hepatic artery coming from the SMA. SMA: Mild atherosclerotic disease in the proximal SMA without significant stenosis. Replaced right hepatic artery is patent. Main branches of the SMA are patent. Renals: Single bilateral renal arteries. Bilateral renal arteries are patent without significant stenosis. IMA: IMA is patent. RIGHT Lower Extremity Inflow: Calcified plaque in the right common iliac artery with at least mild stenosis. Right internal iliac artery is heavily calcified with stenosis. Right external iliac artery is heavily calcified but patent. Outflow: Right common femoral artery is small with some calcified plaque. No significant stenosis. Right profunda femoral arteries are patent. Proximal and mid right SFA are patent without significant stenosis. Plaque and at least mild stenosis in the distal SFA at the adductor canal. Mild-to-moderate stenosis in the proximal popliteal artery. Popliteal artery is patent. Runoff: Three-vessel runoff to the right ankle. Posterior tibial artery is patent across the ankle. No significant flow in the dorsalis pedis artery. LEFT Lower Extremity Inflow: Left common iliac artery is occluded at the origin. Occlusion is chronic based on a CT from 12/24/2017. Chronic occlusion at the origin of the left internal iliac artery. Chronic occlusion of the left external iliac artery. Outflow: Reconstitution the left common femoral artery with calcified plaque. Left profunda femoral arteries are patent. Atherosclerotic disease in the proximal and distal left SFA without an occlusion. Left popliteal artery small with calcifications and at least mild stenosis. Left popliteal artery is patent. Runoff: Three-vessel runoff to the ankle. Posterior tibial artery is patent across the ankle. No significant flow in the dorsalis pedis artery. Veins: No obvious venous abnormality within the limitations of this arterial phase study. Review of the MIP images confirms the above  findings. NON-VASCULAR Lower chest: Limited evaluation of the lung bases. No large pleural effusions. Hepatobiliary: Liver is only partially visualized. Cholecystectomy. No significant biliary dilatation. Pancreas: Unremarkable. No pancreatic ductal dilatation or surrounding inflammatory changes. Spleen: Normal in size without focal abnormality. Entire spleen is not imaged. Adrenals/Urinary Tract: Normal appearance of the adrenal glands. Question low-density cyst along the left kidney lower pole but there is mild perinephric stranding in this area. Negative for hydronephrosis. Nonobstructive 5 mm stone in the right kidney lower pole. This kidney stone appears to be a chronic since 2019. Urinary bladder is decompressed. Stomach/Bowel: Visualized stomach is unremarkable. Normal appearance of the duodenum. Again noted is laxity along the anterior wall with a right paracentral ventral hernia in the upper abdomen containing loops of small bowel. No evidence for bowel dilatation or focal bowel inflammation. Lymphatic: No lymph node enlargement in the abdomen or pelvis. Reproductive: Uterus and bilateral adnexa are unremarkable. Other: Asymmetry along the right anterior abdominal musculature, right side larger than left. There was a similar finding on the exam from 11/10/2018 but there has been slight enlargement of the right anterior abdominal wall thickening measuring roughly 1.4 cm and previously measured 1.1 cm. Cannot exclude a small amount of fluid or prior hematoma in this area. Retroperitoneal stranding is similar to the prior examination. No free fluid. Negative for free air. Musculoskeletal: No acute bone abnormality. Peripherally calcified  lesion in the proximal left tibia. Lesion roughly measures 3.9 cm in the craniocaudal dimension. There is also a poorly defined sclerotic lesion in the distal femur roughly measuring up to 4.7 cm. These lesions were present on a prior MRI from 12/18/2013 and compatible with  old bone infarcts. IMPRESSION: VASCULAR 1. Chronic left inflow disease. Chronic occlusion of the left common iliac artery, left internal iliac artery and left external iliac artery. 2. Stenosis and diffuse calcifications involving the right inflow vessels without occlusion. Disease in the right iliac arterial system could be hemodynamically significant. 3. Mild outflow disease bilaterally. No focal occlusions involving the outflow vessels. 4. No significant runoff disease. Three-vessel runoff to both ankles. 5. Replaced hepatic arteries as described. NON-VASCULAR 1. No acute abnormality in the abdomen or pelvis. 2. Abdominal wall laxity with a right paracentral ventral hernia containing small bowel. No evidence for bowel obstruction or acute bowel inflammation. 3. Asymmetric enlargement of the right abdominal wall musculature compared to the left. This finding has minimally progressed since April 2020. Findings could be related to small amount of fluid or prior hematoma. 4. Nonobstructive right kidney stone. Electronically Signed   By: Markus Daft M.D.   On: 11/27/2018 12:26   Vas [place] Lower Extremity Venous (dvt) (only Mc & Wl 7a-7p)  Result Date: 11/27/2018  Lower Venous Study Indications: Right thigh pain and swelling.  Comparison Study: 07/20/18 negative Performing Technologist: June Leap RDMS, RVT  Examination Guidelines: A complete evaluation includes B-mode imaging, spectral Doppler, color Doppler, and power Doppler as needed of all accessible portions of each vessel. Bilateral testing is considered an integral part of a complete examination. Limited examinations for reoccurring indications may be performed as noted.  +---------+---------------+---------+-----------+----------+--------------+  RIGHT     Compressibility Phasicity Spontaneity Properties Thrombus Aging  +---------+---------------+---------+-----------+----------+--------------+  CFV       Full            Yes       Yes                                     +---------+---------------+---------+-----------+----------+--------------+  SFJ       Full                                                             +---------+---------------+---------+-----------+----------+--------------+  FV Prox   Full                                                             +---------+---------------+---------+-----------+----------+--------------+  FV Mid    Full                                                             +---------+---------------+---------+-----------+----------+--------------+  FV Distal Full                                                             +---------+---------------+---------+-----------+----------+--------------+  PFV       Full                                                             +---------+---------------+---------+-----------+----------+--------------+  POP       Full            Yes       Yes                                    +---------+---------------+---------+-----------+----------+--------------+  PTV       Full                                                             +---------+---------------+---------+-----------+----------+--------------+  PERO      Full                                                             +---------+---------------+---------+-----------+----------+--------------+   Right Technical Findings: Right LE arteries visualized and patent with normal flow.    Summary: Right: There is no evidence of deep vein thrombosis in the lower extremity. No cystic structure found in the popliteal fossa.  *See table(s) above for measurements and observations. Electronically signed by Harold Barban MD on 11/27/2018 at 3:17:50 PM.    Final     Procedures Procedures (including critical care time)  Medications Ordered in ED Medications  morphine 4 MG/ML injection 4 mg (4 mg Intravenous Given 11/28/18 0118)  oxyCODONE (Oxy IR/ROXICODONE) immediate release tablet 5 mg (5 mg Oral Given 11/28/18 0214)     Initial  Impression / Assessment and Plan / ED Course  I have reviewed the triage vital signs and the nursing notes.  Pertinent labs & imaging results that were available during my care of the patient were reviewed by me and considered in my medical decision making (see chart for details).        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  The patient was evaluated in the ER earlier today and underwent an extensive work-up including labs, CT angios of the abdominal aorta, and consultation with vascular surgery.  She was discharged home with Vicodin.  Unfortunately after she was home, her pain was not well controlled, so she returned to the emergency department.  The patient's medical record was reviewed extensively by me and Dr. Leonides Schanz, attending physician.  Although she has some abdominal discomfort on exam, she is having no nausea or vomiting.  No metabolic derangements.  UA was unremarkable and she has no GU complaints.  She has no focal  tenderness and has had no recent trauma or injuries to suggest occult fracture.  There was also no abnormalities noted to the pelvis or hips on CT performed earlier today.  The patient has scheduled follow-up with vascular surgery, neurosurgery, and primary care.  After 1 dose of IV morphine, I transition the patient to oral oxycodone, and the patient reports that pain was significantly controlled.  Advised the patient to discontinue home Vicodin and follow-up as directed previously.  She is hemodynamically stable and in no acute distress.  Strict return precautions given.  Safe for discharge home with outpatient follow-up.  Final Clinical  Impressions(s) / ED Diagnoses   Final diagnoses:  Inadequate pain control    ED Discharge Orders         Ordered    oxyCODONE (ROXICODONE) 5 MG immediate release tablet  Every 6 hours PRN     11/28/18 0251           Usbaldo Pannone A, PA-C 11/28/18 0356    Ward, Delice Bison, DO 11/28/18 0405

## 2018-11-28 NOTE — Discharge Instructions (Signed)
Thank you for allowing me to care for you today in the Emergency Department.   You can take 1 tablet of oxycodone every 6 hours as needed for severe pain.  This medication has been called into your pharmacy.  Do not take this in conjunction with the Vicodin prescription that you were given as this will be too much pain medication.  You can try applying warm compresses to the area to help with your pain.  Keep your follow-up appointment with vascular surgery.  Follow-up with primary care as needed for pain control.  Return to the emergency department if you develop severe swelling, redness, warmth, fever, chills, if you become unable to lift the right leg due to weakness, if your toes turn blue, if you have another fall or injury, or other new, concerning symptoms.

## 2018-11-28 NOTE — ED Notes (Signed)
Complaints of right thigh pain that awoke her from sleep at 0100 today. Leg is tender to palpation and ROM. Unable to palpate distal pulses though they were found with use of a doppler. Leg warm and dry. Motor/sensory intact.

## 2018-11-30 DIAGNOSIS — F17210 Nicotine dependence, cigarettes, uncomplicated: Secondary | ICD-10-CM | POA: Diagnosis not present

## 2018-11-30 DIAGNOSIS — J449 Chronic obstructive pulmonary disease, unspecified: Secondary | ICD-10-CM | POA: Diagnosis not present

## 2018-11-30 DIAGNOSIS — G4733 Obstructive sleep apnea (adult) (pediatric): Secondary | ICD-10-CM | POA: Diagnosis not present

## 2018-11-30 DIAGNOSIS — J9611 Chronic respiratory failure with hypoxia: Secondary | ICD-10-CM | POA: Diagnosis not present

## 2018-11-30 DIAGNOSIS — R911 Solitary pulmonary nodule: Secondary | ICD-10-CM | POA: Diagnosis not present

## 2018-12-05 DIAGNOSIS — M25551 Pain in right hip: Secondary | ICD-10-CM | POA: Diagnosis not present

## 2018-12-06 ENCOUNTER — Other Ambulatory Visit: Payer: Self-pay | Admitting: *Deleted

## 2018-12-06 DIAGNOSIS — I739 Peripheral vascular disease, unspecified: Secondary | ICD-10-CM

## 2018-12-20 ENCOUNTER — Ambulatory Visit (INDEPENDENT_AMBULATORY_CARE_PROVIDER_SITE_OTHER): Payer: Federal, State, Local not specified - PPO | Admitting: Family

## 2018-12-20 ENCOUNTER — Other Ambulatory Visit: Payer: Self-pay

## 2018-12-20 ENCOUNTER — Encounter: Payer: Self-pay | Admitting: Family

## 2018-12-20 VITALS — BP 106/62 | HR 90 | Ht 62.0 in | Wt 237.0 lb

## 2018-12-20 DIAGNOSIS — I25118 Atherosclerotic heart disease of native coronary artery with other forms of angina pectoris: Secondary | ICD-10-CM

## 2018-12-20 DIAGNOSIS — E782 Mixed hyperlipidemia: Secondary | ICD-10-CM | POA: Diagnosis not present

## 2018-12-20 DIAGNOSIS — I5042 Chronic combined systolic (congestive) and diastolic (congestive) heart failure: Secondary | ICD-10-CM

## 2018-12-20 DIAGNOSIS — J449 Chronic obstructive pulmonary disease, unspecified: Secondary | ICD-10-CM

## 2018-12-20 DIAGNOSIS — I255 Ischemic cardiomyopathy: Secondary | ICD-10-CM

## 2018-12-20 DIAGNOSIS — Z720 Tobacco use: Secondary | ICD-10-CM | POA: Diagnosis not present

## 2018-12-20 MED ORDER — ENTRESTO 24-26 MG PO TABS: 1.0000 | ORAL_TABLET | Freq: Two times a day (BID) | ORAL | 1 refills | Status: DC

## 2018-12-20 NOTE — Progress Notes (Signed)
Office Visit    Patient Name: Crystal Yates Date of Encounter: 12/20/2018  Primary Care Provider:  Dortha Kern, [place] Primary Cardiologist:  Jenne Campus, MD  Chief Complaint    63 yo female with PMH ischemic cardiomyopathy EF 40-45%, HTN, chronic systolic and diastolic congestive heart failure, COPD on 2L, CAD s/p stenting presents for follow up after initiation of Entresto.  Past Medical History    Past Medical History:  Diagnosis Date  . Acute on chronic systolic heart failure exacerbation(HCC) 04/08/2016  . Arthritis   . CAD in native artery    a. Prior LAD stenting based on cath. b. RCA stenting 03/2016 x2.  . Chronic combined systolic and diastolic CHF (congestive heart failure) ([place])   . CKD (chronic kidney disease), stage II   . COPD (chronic obstructive pulmonary disease) ([place])   . Diabetes mellitus without complication ([place])   . Hashimoto's thyroiditis   . History of blood transfusion   . Hyperlipidemia   . Hypertension   . On home oxygen therapy    "2L; 24/7" (10/23/2017)  . Secondary adrenal insufficiency ([place])   . Thyroid disease   . Tobacco abuse    Past Surgical History:  Procedure Laterality Date  . ABDOMINAL SURGERY    . CESAREAN SECTION    . CHOLECYSTECTOMY    . COLON RESECTION    . COLONOSCOPY WITH PROPOFOL N/A 01/16/2018   Procedure: COLONOSCOPY WITH PROPOFOL;  Surgeon: Rush Landmark Telford Nab., MD;  Location: [facility];  Service: Gastroenterology;  Laterality: N/A;  . CORONARY BALLOON ANGIOPLASTY N/A 10/13/2017   Procedure: CORONARY BALLOON ANGIOPLASTY;  Surgeon: Belva Crome, MD;  Location: [facility];  Service: Cardiovascular;  Laterality: N/A;  . HERNIA MESH REMOVAL    . HERNIA REPAIR    . LEFT HEART CATH AND CORONARY ANGIOGRAPHY N/A 10/13/2016   Procedure: Left Heart Cath and Coronary Angiography;  Surgeon: Nelva Bush, MD;  Location: [facility];  Service: Cardiovascular;  Laterality: N/A;  . POLYPECTOMY   01/16/2018   Procedure: POLYPECTOMY;  Surgeon: Rush Landmark Telford Nab., MD;  Location: [facility];  Service: Gastroenterology;;  . RIGHT/LEFT HEART CATH AND CORONARY ANGIOGRAPHY N/A 10/13/2017   Procedure: RIGHT/LEFT HEART CATH AND CORONARY ANGIOGRAPHY;  Surgeon: Belva Crome, MD;  Location: [facility];  Service: Cardiovascular;  Laterality: N/A;  . SHOULDER ARTHROSCOPY    . TUBAL LIGATION      Allergies  Allergies  Allergen Reactions  . Hydroxychloroquine Shortness Of Breath, Nausea Only and Other (See Comments)    Dizziness  . Donepezil Other (See Comments)    Dizziness, depression, and makes the patient feel "funny"  . Prednisone Other (See Comments)    Causes depression and suicidal thoughts  . Anticoagulant Cit Dext [Acd Formula A] Other (See Comments)    Unknown  . Bupropion Other (See Comments)    Suicidal thoughts  . Metrizamide Other (See Comments)    (a non-ionic radiopaque contrast agent) "Blows the vein" and contrast gathers at the injected site's limb  . Varenicline Other (See Comments)    Suicidal thoughts  . Tape Rash and Other (See Comments)    Paper tape is preferred, PLEASE    History of Present Illness    Crystal Yates is a 63 y.o. female with a hx of ischemic cardiomyopathy EF 40-45%, HTN, chronic systolic and diastolic congestive heart failure, COPD on 2L, CAD s/p stent, adrenal insufficiency on chronic Cortef, DM2 on long term insulin,  CKD3, hypothyroidism, tobacco abuse. Last seen by my 11/22/18.   She was admitted to [facility] 11/14/18 - 11/18/18 for acute on chronic respiratory failure, COPD exacerbation, metabolic acidosis, SIRS, and chronic combined systolic/diastolic HF. After presenting tot he ED she was intubated, admitted to ICU. Echo 11/15/18 EF 35-40% mildly dilated LV, pseudonormal LV diastolic parameters, diffus LV hypokinesis. She was discharged on Lasix 40mg  BID, dexamethasone, inhalers. Of note, she has previously not had  good diuretic effect with Metolazone.   At her last office visit she was started on Entresto 24/26mg  BID. She presents today for follow up.   Seen at [facility] ED 11/27/18 with acute RLE pain. Negative for DVT. CTA noted stenosis R iliac artery, reviewed by [facility] recommended follow up in clinic. She re-presented to the ED that evening, she was given 1 dose IV morphine and home pain control regimen was changed from Vicodin to Oxycodone. She is scheduled for follow up with VVS and ABI on 12/24/18.   Reports continued intermittent chest pains that did not require nitroglycerin. They are fleeting and associated with when she is short of breath. Still on her normal 2L. She follows with pulmonology, but has not seen them recently.   Reports her edema is at her baseline. Wears compression stockings daily and tries to elevate her LE when sitting regularly.   Tells me she is planning to move to [place] to live near her sisters. Her house goes on the market Monday and planning to move shortly afterwards. I have encouraged her to establish care with a PCP, cardiologist, pulmonologist.   EKGs/Labs/Other Studies Reviewed:   The following studies were reviewed today: Echo 11/15/18 1. The left ventricle has moderate-severely reduced systolic function, with an ejection fraction of 35-40%. The cavity size was mildly dilated. Left ventricular diastolic Doppler parameters are consistent with pseudonormalization. Elevated left  ventricular end-diastolic pressure Left ventricular diffuse hypokinesis There is severe hypokinesis to akinesis of the mid and basal inferior and inferolateral walls.  2. The right ventricle has normal systolic function. The cavity was normal. There is no increase in right ventricular wall thickness. Right ventricular systolic pressure could not be assessed.  3. The aorta is normal unless otherwise noted.  4. Left atrial size was mildly dilated.  5. The interatrial septum appears to be lipomatous.   LHC/RHC 09/2017  Normal left main.  Proximal to mid LAD stent with diffuse 30% restenosis.  Proximal circumflex with an eccentric 50 to 65% stenosis depending upon review..  Progression at this site is noted when compared to one year ago when this region contained 30% narrowing.  FFR was not possible due to COPD and intolerance of adenosine.  Dominant right coronary with proximal to distal stenting and overlap fashion, possibly with some region of stent within stent.  Distally there is an eccentric 70 to 75% stenosis that is clearly progressed when compared to one year ago when there was less than 50% narrowing.  Mild pulmonary hypertension with mean PA pressure of 30 mmHg.  Reduced LV systolic function with regional wall motion abnormality including severe hypokinesis of the inferobasal wall.  Estimated EF 40 to 45%.  LVEDP is 20 mmHg.  Mean pulmonary capillary wedge pressure is 20 mmHg.  Successful balloon angioplasty of distal right coronary ISR reducing from 75% to 0% with TIMI grade III flow using a 3.5 mm [place] balloon x2 inflations.   RECOMMENDATIONS:  Dual antiplatelet therapy indefinitely  Risk factor modification.  We discussed smoking cessation.  LDL less  than 70.  Bivalirudin is discontinued and sheath will be removed later this afternoon.  Monitor closely for bleeding/access site hematoma.  If stable in a.m. without access site complication, she will be eligible for discharge.  Consider circumflex PCI if persistent symptoms/angina.  Recommend to resume Aspirin and Plavix, at currently prescribed dose and frequency, on To be continued indefinitely..  Indefinite  EKG: No EKG today.  Recent Labs: 08/06/2018: TSH 1.316 10/18/2018: NT-Pro BNP 1,150 11/14/2018: B Natriuretic Peptide 785.7 11/18/2018: Magnesium 2.2 11/22/2018: ALT 23 11/27/2018: BUN 35; Creatinine, Ser 1.10; Hemoglobin 13.6; Platelets 202; Potassium 4.3; Sodium 138  Recent Lipid Panel    Component Value Date/Time    CHOL 190 11/22/2018 1104   TRIG 131 11/22/2018 1104   HDL 62 11/22/2018 1104   CHOLHDL 3.1 11/22/2018 1104   CHOLHDL 3.6 10/13/2016 0037   VLDL 14 10/13/2016 0037   LDLCALC 102 (H) 11/22/2018 1104    Home Medications   Current Meds  Medication Sig  . acetaminophen (TYLENOL) 500 MG tablet Take 1 tablet (500 mg total) by mouth every 6 (six) hours as needed for mild pain.  Marland Kitchen albuterol (PROAIR HFA) 108 (90 Base) MCG/ACT inhaler Inhale 2 puffs into the lungs every 4-6 hours as needed for shortness of breath or wheezing  . albuterol (PROVENTIL) (2.5 MG/3ML) 0.083% nebulizer solution Take 3 mLs (2.5 mg total) by nebulization every 6 (six) hours as needed for wheezing or shortness of breath.  . Amino Acids-Protein Hydrolys (FEEDING SUPPLEMENT, PRO-STAT SUGAR FREE 64,) LIQD Take 30 mLs by mouth 2 (two) times daily.  Marland Kitchen aspirin EC 81 MG EC tablet Take 1 tablet (81 mg total) by mouth daily.  Marland Kitchen atorvastatin (LIPITOR) 80 MG tablet Take 80 mg by mouth every evening.   Marland Kitchen CALCIUM POLYCARBOPHIL PO Take 3 tablets by mouth daily.  . Cholecalciferol (VITAMIN D3) 2000 units capsule Take 2,000 Units by mouth daily.   . clopidogrel (PLAVIX) 75 MG tablet Take 1 tablet (75 mg total) by mouth daily.  . DULoxetine (CYMBALTA) 60 MG capsule Take 120 mg by mouth daily.   Marland Kitchen EMGALITY 120 MG/ML SOAJ Take 120 mg by mouth every 30 (thirty) days.  . furosemide (LASIX) 40 MG tablet Take 1 tablet (40 mg total) by mouth 2 (two) times daily. Make take one additional tablet for weight gain of 3 lbs overnight or 5 lbs in one week.  Marland Kitchen HYDROcodone-acetaminophen (NORCO/VICODIN) 5-325 MG tablet Take 1 tablet by mouth every 6 (six) hours as needed.  . hydrocortisone (CORTEF) 5 MG tablet Take 5-15 mg by mouth See admin instructions. Take 3 tablets in the morning and take 1 tablet in the evening  . insulin degludec (TRESIBA FLEXTOUCH) 100 UNIT/ML SOPN FlexTouch Pen Inject 50 Units into the skin at bedtime.   . Iron-FA-B  Cmp-C-Biot-Probiotic (FUSION PLUS) CAPS Take 1 capsule by mouth at bedtime.   Marland Kitchen levothyroxine (SYNTHROID, LEVOTHROID) 150 MCG tablet Take 150 mcg by mouth daily before breakfast.  . LORazepam (ATIVAN) 0.5 MG tablet Take 1 tablet by mouth daily as needed.   Marland Kitchen MAGNESIUM-OXIDE 400 (241.3 Mg) MG tablet Take 400 mg by mouth daily.  . nitroGLYCERIN (NITROSTAT) 0.4 MG SL tablet Place 1 tablet (0.4 mg total) under the tongue every 5 (five) minutes as needed for chest pain.  Marland Kitchen oxyCODONE (ROXICODONE) 5 MG immediate release tablet Take 1 tablet (5 mg total) by mouth every 6 (six) hours as needed for severe pain.  . OXYGEN Place 2 L into the nose continuous.   Marland Kitchen  polyethylene glycol (MIRALAX / GLYCOLAX) packet Take 17 g by mouth daily. (Patient taking differently: Take 17 g by mouth daily as needed for mild constipation. )  . potassium chloride SA (K-DUR) 20 MEQ tablet Take 40 mEq by mouth daily.  . pramipexole (MIRAPEX) 1 MG tablet Take 1 mg by mouth 2 (two) times a day.  . sacubitril-valsartan (ENTRESTO) 24-26 MG Take 1 tablet by mouth 2 (two) times daily.  Marland Kitchen tiZANidine (ZANAFLEX) 4 MG tablet Take 4 mg by mouth 2 (two) times daily as needed for muscle spasms.  . TRELEGY ELLIPTA 100-62.5-25 MCG/INH AEPB Inhale 1 puff into the lungs daily.  . TRULICITY 1.5 0000000 SOPN Inject 1.5 mg into the skin every Sunday.   . zolpidem (AMBIEN) 5 MG tablet Take 5 mg by mouth at bedtime as needed for sleep.  . [DISCONTINUED] sacubitril-valsartan (ENTRESTO) 24-26 MG Take 1 tablet by mouth 2 (two) times daily.      Review of Systems    Review of Systems  Constitution: Negative for chills, fever and malaise/fatigue.  Cardiovascular: Positive for chest pain (intermittent episodes), dyspnea on exertion and leg swelling (at baseline). Negative for near-syncope, orthopnea, palpitations and paroxysmal nocturnal dyspnea.  Respiratory: Positive for shortness of breath (chronic). Negative for cough and wheezing.    Musculoskeletal:       Leg pain - appt with [facility] Monday  Gastrointestinal: Negative for nausea and vomiting.  Neurological: Positive for light-headedness (with position changes). Negative for dizziness and weakness.   All other systems reviewed and are otherwise negative except as noted above.  Physical Exam    VS:  BP 106/62 (BP Location: Left Arm, Patient Position: Sitting, Cuff Size: Large)   Pulse 90   Ht 5\' 2"  (1.575 m)   Wt 237 lb (107.5 kg)   SpO2 98%   BMI 43.35 kg/m  , BMI Body mass index is 43.35 kg/m. GEN: Well nourished, well developed, in no acute distress. HEENT: normal. Neck: Supple, no JVD, carotid bruits, or masses. Cardiac: RRR, no murmurs, rubs, or gallops. No clubbing, cyanosis. Nonpitting edema LLE, compression stockings on.  Radials/DP/PT 2+ and equal bilaterally.  Respiratory:  Respirations regular and unlabored, clear to auscultation bilaterally. GI: Soft, nontender, nondistended, BS + x 4. MS: No deformity or atrophy. Skin: Warm and dry, no rash. Scattered ecchymosis to upper extremities.  Neuro:  Strength and sensation are intact. Psych: Normal affect.   Assessment & Plan    1. Chronic combined systolic and diastolic heart failure - Echo 11/15/18 EF 35-40%, pseudonormal LV diastolic dysfunction.  NYHA II symptoms.  Nonpitting lower extremity edema LLE only, weight stable.  No beta-blocker secondary to COPD.    GDMT: Continue Entresto 24/26 mg twice daily. BMP today. Unable to uptitrate or add MRA due to low normal BP.   2. Ischemic cardiomyopathy - Plan as above. Current stable anginal symptoms, as below.    3. CAD with stable angina- LHC/RHC 09/2017 showed prox-mid LAD stent 30% diffuse restenosis, prox Cx with eccentric 50-65% stenosis, distal RCA eccentric 70-75% stenosis - balloon angioplasty reduced to 0%, mild pulmonary HTN. Consider Cx PCI if persistent angina. Indefinite DAPT with aspirin and Plavix. GDMT aspirin, statin, PRN nitroglycerin- no beta  blocker secondary to COPD. Continued stable intermittent chest pains that do not require nitroglycerin. Would defer ischemic evaluation at this time as the majority of her symptoms appear to be due to her reduced EF/COPD.  4. HTN - BP low normal today which is a stable compared to  home readings. Reports no worsening lightheadedness. Does get occasionally lightheaded on standing. No change to anti-hypertensive regimen.   5. COPD - Follows with pulmonology. Continues chronic 2L. Lung sounds clear on exam. Likely etiology continued SOB.   6. Tobacco abuse -She has resumed intermittent smoking. Tells me cigarettes have started to taste bad. Encouraged her current cessation efforts.   7. HLD - Lipid profile at last office visit with LDL 103. Goal LDL <70. On discussion was not fasting at time of last lab draw. Direct LDL today. If LDL remains elevated will plan to add Zetia.   Disposition: Follow up in 2 months with Dr. Agustin Cree or establish with cardiology in [place].   Loel Dubonnet, NP 12/20/2018, 10:32 AM

## 2018-12-20 NOTE — Patient Instructions (Signed)
Medication Instructions:  No medication changes today.   If you need a refill on your cardiac medications before your next appointment, please call your pharmacy.   Lab work: Your physician recommends that you return for lab work today: BMET, direct LDL  This will check your kidney function, electrolytes, your "bad cholesterol"  If you have labs (blood work) drawn today and your tests are completely normal, you will receive your results only by: Marland Kitchen MyChart Message (if you have MyChart) OR . A paper copy in the mail If you have any lab test that is abnormal or we need to change your treatment, we will call you to review the results.  Testing/Procedures: None ordered today.   Follow-Up: At [facility], you and your health needs are our priority.  As part of our continuing mission to provide you with exceptional heart care, we have created designated Provider Care Teams.  These Care Teams include your primary Cardiologist (physician) and Advanced Practice Providers (APPs -  Physician Assistants and Nurse Practitioners) who all work together to provide you with the care you need, when you need it. You will need a follow up appointment in 2 months.  You may see Jenne Campus, MD or another member of our [facility] Provider Team in [place] or [place]: Shirlee More, MD . Jyl Heinz, MD  Any Other Special Instructions Will Be Listed Below (If Applicable).

## 2018-12-21 DIAGNOSIS — M545 Low back pain: Secondary | ICD-10-CM | POA: Diagnosis not present

## 2018-12-21 DIAGNOSIS — G4733 Obstructive sleep apnea (adult) (pediatric): Secondary | ICD-10-CM | POA: Diagnosis not present

## 2018-12-21 LAB — BASIC METABOLIC PANEL
BUN/Creatinine Ratio: 18 (ref 12–28)
BUN: 20 mg/dL (ref 8–27)
CO2: 26 mmol/L (ref 20–29)
Calcium: 9.5 mg/dL (ref 8.7–10.3)
Chloride: 105 mmol/L (ref 96–106)
Creatinine, Ser: 1.12 mg/dL — ABNORMAL HIGH (ref 0.57–1.00)
GFR calc Af Amer: 60 mL/min/{1.73_m2} (ref >59)
GFR calc non Af Amer: 52 mL/min/{1.73_m2} — ABNORMAL LOW (ref >59)
Glucose: 63 mg/dL — ABNORMAL LOW (ref 65–99)
Potassium: 4.1 mmol/L (ref 3.5–5.2)
Sodium: 142 mmol/L (ref 134–144)

## 2018-12-21 LAB — LDL CHOLESTEROL, DIRECT: LDL Direct: 95 mg/dL (ref 0–99)

## 2018-12-24 ENCOUNTER — Inpatient Hospital Stay (HOSPITAL_COMMUNITY): Admission: RE | Admit: 2018-12-24 | Payer: Federal, State, Local not specified - PPO | Source: Ambulatory Visit

## 2018-12-24 ENCOUNTER — Inpatient Hospital Stay (HOSPITAL_COMMUNITY)
Admission: EM | Admit: 2018-12-24 | Discharge: 2018-12-28 | DRG: 208 | Disposition: A | Discharge status: Home Health | Payer: Federal, State, Local not specified - PPO | Attending: Internal Medicine | Admitting: Internal Medicine

## 2018-12-24 ENCOUNTER — Emergency Department (HOSPITAL_COMMUNITY): Payer: Federal, State, Local not specified - PPO

## 2018-12-24 ENCOUNTER — Other Ambulatory Visit: Payer: Self-pay

## 2018-12-24 ENCOUNTER — Ambulatory Visit: Payer: Federal, State, Local not specified - PPO | Admitting: Surgery

## 2018-12-24 DIAGNOSIS — R0602 Shortness of breath: Secondary | ICD-10-CM | POA: Diagnosis not present

## 2018-12-24 DIAGNOSIS — Z6841 Body Mass Index (BMI) 40.0 and over, adult: Secondary | ICD-10-CM

## 2018-12-24 DIAGNOSIS — Z7982 Long term (current) use of aspirin: Secondary | ICD-10-CM | POA: Diagnosis not present

## 2018-12-24 DIAGNOSIS — I5043 Acute on chronic combined systolic (congestive) and diastolic (congestive) heart failure: Secondary | ICD-10-CM | POA: Diagnosis not present

## 2018-12-24 DIAGNOSIS — J9622 Acute and chronic respiratory failure with hypercapnia: Secondary | ICD-10-CM | POA: Diagnosis not present

## 2018-12-24 DIAGNOSIS — J9692 Respiratory failure, unspecified with hypercapnia: Secondary | ICD-10-CM | POA: Diagnosis present

## 2018-12-24 DIAGNOSIS — G4700 Insomnia, unspecified: Secondary | ICD-10-CM | POA: Diagnosis present

## 2018-12-24 DIAGNOSIS — Z20828 Contact with and (suspected) exposure to other viral communicable diseases: Secondary | ICD-10-CM | POA: Diagnosis not present

## 2018-12-24 DIAGNOSIS — R0603 Acute respiratory distress: Secondary | ICD-10-CM | POA: Diagnosis not present

## 2018-12-24 DIAGNOSIS — E1122 Type 2 diabetes mellitus with diabetic chronic kidney disease: Secondary | ICD-10-CM | POA: Diagnosis not present

## 2018-12-24 DIAGNOSIS — N183 Chronic kidney disease, stage 3 unspecified: Secondary | ICD-10-CM | POA: Diagnosis present

## 2018-12-24 DIAGNOSIS — J441 Chronic obstructive pulmonary disease with (acute) exacerbation: Secondary | ICD-10-CM | POA: Diagnosis not present

## 2018-12-24 DIAGNOSIS — I959 Hypotension, unspecified: Secondary | ICD-10-CM | POA: Diagnosis present

## 2018-12-24 DIAGNOSIS — M3500 Sicca syndrome, unspecified: Secondary | ICD-10-CM | POA: Diagnosis present

## 2018-12-24 DIAGNOSIS — E039 Hypothyroidism, unspecified: Secondary | ICD-10-CM | POA: Diagnosis present

## 2018-12-24 DIAGNOSIS — J9690 Respiratory failure, unspecified, unspecified whether with hypoxia or hypercapnia: Secondary | ICD-10-CM

## 2018-12-24 DIAGNOSIS — Z79899 Other long term (current) drug therapy: Secondary | ICD-10-CM

## 2018-12-24 DIAGNOSIS — Z9981 Dependence on supplemental oxygen: Secondary | ICD-10-CM | POA: Diagnosis not present

## 2018-12-24 DIAGNOSIS — F17210 Nicotine dependence, cigarettes, uncomplicated: Secondary | ICD-10-CM | POA: Diagnosis not present

## 2018-12-24 DIAGNOSIS — E662 Morbid (severe) obesity with alveolar hypoventilation: Secondary | ICD-10-CM | POA: Diagnosis present

## 2018-12-24 DIAGNOSIS — E1169 Type 2 diabetes mellitus with other specified complication: Secondary | ICD-10-CM

## 2018-12-24 DIAGNOSIS — Z7902 Long term (current) use of antithrombotics/antiplatelets: Secondary | ICD-10-CM | POA: Diagnosis not present

## 2018-12-24 DIAGNOSIS — I5023 Acute on chronic systolic (congestive) heart failure: Secondary | ICD-10-CM | POA: Diagnosis not present

## 2018-12-24 DIAGNOSIS — R069 Unspecified abnormalities of breathing: Secondary | ICD-10-CM | POA: Diagnosis not present

## 2018-12-24 DIAGNOSIS — I472 Ventricular tachycardia: Secondary | ICD-10-CM | POA: Diagnosis not present

## 2018-12-24 DIAGNOSIS — J9621 Acute and chronic respiratory failure with hypoxia: Secondary | ICD-10-CM

## 2018-12-24 DIAGNOSIS — Z79890 Hormone replacement therapy (postmenopausal): Secondary | ICD-10-CM

## 2018-12-24 DIAGNOSIS — G4733 Obstructive sleep apnea (adult) (pediatric): Secondary | ICD-10-CM | POA: Diagnosis not present

## 2018-12-24 DIAGNOSIS — N179 Acute kidney failure, unspecified: Secondary | ICD-10-CM | POA: Diagnosis present

## 2018-12-24 DIAGNOSIS — R4020 Unspecified coma: Secondary | ICD-10-CM | POA: Diagnosis not present

## 2018-12-24 DIAGNOSIS — I2510 Atherosclerotic heart disease of native coronary artery without angina pectoris: Secondary | ICD-10-CM | POA: Diagnosis present

## 2018-12-24 DIAGNOSIS — Z955 Presence of coronary angioplasty implant and graft: Secondary | ICD-10-CM | POA: Diagnosis not present

## 2018-12-24 DIAGNOSIS — Z7952 Long term (current) use of systemic steroids: Secondary | ICD-10-CM | POA: Diagnosis not present

## 2018-12-24 DIAGNOSIS — E785 Hyperlipidemia, unspecified: Secondary | ICD-10-CM

## 2018-12-24 DIAGNOSIS — Z23 Encounter for immunization: Secondary | ICD-10-CM

## 2018-12-24 DIAGNOSIS — J9601 Acute respiratory failure with hypoxia: Secondary | ICD-10-CM | POA: Diagnosis not present

## 2018-12-24 DIAGNOSIS — J80 Acute respiratory distress syndrome: Secondary | ICD-10-CM | POA: Diagnosis not present

## 2018-12-24 DIAGNOSIS — Z4682 Encounter for fitting and adjustment of non-vascular catheter: Secondary | ICD-10-CM | POA: Diagnosis not present

## 2018-12-24 HISTORY — DX: Sjogren syndrome, unspecified: M35.00

## 2018-12-24 HISTORY — DX: Nicotine dependence, unspecified, uncomplicated: F17.200

## 2018-12-24 HISTORY — DX: Heart failure, unspecified: I50.9

## 2018-12-24 HISTORY — DX: Type 2 diabetes mellitus without complications: E11.9

## 2018-12-24 HISTORY — DX: Dependence on supplemental oxygen: Z99.81

## 2018-12-24 LAB — URINALYSIS, ROUTINE W REFLEX MICROSCOPIC
Bilirubin Urine: NEGATIVE
Glucose, UA: NEGATIVE mg/dL
Hgb urine dipstick: NEGATIVE
Ketones, ur: NEGATIVE mg/dL
Leukocytes,Ua: NEGATIVE
Nitrite: NEGATIVE
Protein, ur: NEGATIVE mg/dL
Specific Gravity, Urine: 1.011 (ref 1.005–1.030)
pH: 5 (ref 5.0–8.0)

## 2018-12-24 LAB — CBC WITH DIFFERENTIAL/PLATELET
Abs Immature Granulocytes: 0.28 10*3/uL — ABNORMAL HIGH (ref 0.00–0.07)
Basophils Absolute: 0.1 10*3/uL (ref 0.0–0.1)
Basophils Relative: 1 %
Eosinophils Absolute: 0.2 10*3/uL (ref 0.0–0.5)
Eosinophils Relative: 1 %
HCT: 42.4 % (ref 36.0–46.0)
Hemoglobin: 13 g/dL (ref 12.0–15.0)
Immature Granulocytes: 2 %
Lymphocytes Relative: 31 %
Lymphs Abs: 3.7 10*3/uL (ref 0.7–4.0)
MCH: 30 pg (ref 26.0–34.0)
MCHC: 30.7 g/dL (ref 30.0–36.0)
MCV: 97.9 fL (ref 80.0–100.0)
Monocytes Absolute: 0.7 10*3/uL (ref 0.1–1.0)
Monocytes Relative: 6 %
Neutro Abs: 7.2 10*3/uL (ref 1.7–7.7)
Neutrophils Relative %: 59 %
Platelets: 248 10*3/uL (ref 150–400)
RBC: 4.33 MIL/uL (ref 3.87–5.11)
RDW: 14.9 % (ref 11.5–15.5)
WBC: 12.1 10*3/uL — ABNORMAL HIGH (ref 4.0–10.5)
nRBC: 0 % (ref 0.0–0.2)

## 2018-12-24 LAB — COMPREHENSIVE METABOLIC PANEL
ALT: 23 U/L (ref 0–44)
AST: 30 U/L (ref 15–41)
Albumin: 2.8 g/dL — ABNORMAL LOW (ref 3.5–5.0)
Alkaline Phosphatase: 75 U/L (ref 38–126)
Anion gap: 9 (ref 5–15)
BUN: 23 mg/dL (ref 8–23)
CO2: 23 mmol/L (ref 22–32)
Calcium: 9.1 mg/dL (ref 8.9–10.3)
Chloride: 107 mmol/L (ref 98–111)
Creatinine, Ser: 1.25 mg/dL — ABNORMAL HIGH (ref 0.44–1.00)
GFR calc Af Amer: 53 mL/min — ABNORMAL LOW (ref >60)
GFR calc non Af Amer: 46 mL/min — ABNORMAL LOW (ref >60)
Glucose, Bld: 296 mg/dL — ABNORMAL HIGH (ref 70–99)
Potassium: 4 mmol/L (ref 3.5–5.1)
Sodium: 139 mmol/L (ref 135–145)
Total Bilirubin: 0.4 mg/dL (ref 0.3–1.2)
Total Protein: 6.2 g/dL — ABNORMAL LOW (ref 6.5–8.1)

## 2018-12-24 LAB — CBG MONITORING, ED
Glucose-Capillary: 277 mg/dL — ABNORMAL HIGH (ref 70–99)
Glucose-Capillary: 239 mg/dL — ABNORMAL HIGH (ref 70–99)

## 2018-12-24 LAB — POCT I-STAT 7, (LYTES, BLD GAS, ICA,H+H)
Acid-base deficit: 2 mmol/L (ref 0.0–2.0)
Acid-base deficit: 4 mmol/L — ABNORMAL HIGH (ref 0.0–2.0)
Acid-base deficit: 2 mmol/L (ref 0.0–2.0)
Bicarbonate: 28.8 mmol/L — ABNORMAL HIGH (ref 20.0–28.0)
Bicarbonate: 27.2 mmol/L (ref 20.0–28.0)
Bicarbonate: 23.9 mmol/L (ref 20.0–28.0)
Calcium, Ion: 1.37 mmol/L (ref 1.15–1.40)
Calcium, Ion: 1.34 mmol/L (ref 1.15–1.40)
Calcium, Ion: 1.24 mmol/L (ref 1.15–1.40)
HCT: 38 % (ref 36.0–46.0)
HCT: 40 % (ref 36.0–46.0)
HCT: 36 % (ref 36.0–46.0)
Hemoglobin: 12.9 g/dL (ref 12.0–15.0)
Hemoglobin: 13.6 g/dL (ref 12.0–15.0)
Hemoglobin: 12.2 g/dL (ref 12.0–15.0)
O2 Saturation: 90 %
O2 Saturation: 89 %
O2 Saturation: 95 %
Patient temperature: 98.6
Patient temperature: 98.6
Patient temperature: 98.6
Potassium: 4 mmol/L (ref 3.5–5.1)
Potassium: 4.1 mmol/L (ref 3.5–5.1)
Potassium: 3.7 mmol/L (ref 3.5–5.1)
Sodium: 141 mmol/L (ref 135–145)
Sodium: 141 mmol/L (ref 135–145)
Sodium: 142 mmol/L (ref 135–145)
TCO2: 31 mmol/L (ref 22–32)
TCO2: 30 mmol/L (ref 22–32)
TCO2: 25 mmol/L (ref 22–32)
pCO2 arterial: 82.5 mmHg (ref 32.0–48.0)
pCO2 arterial: 77.9 mmHg (ref 32.0–48.0)
pCO2 arterial: 42.6 mmHg (ref 32.0–48.0)
pH, Arterial: 7.151 — CL (ref 7.350–7.450)
pH, Arterial: 7.151 — CL (ref 7.350–7.450)
pH, Arterial: 7.357 (ref 7.350–7.450)
pO2, Arterial: 77 mmHg — ABNORMAL LOW (ref 83.0–108.0)
pO2, Arterial: 74 mmHg — ABNORMAL LOW (ref 83.0–108.0)
pO2, Arterial: 81 mmHg — ABNORMAL LOW (ref 83.0–108.0)

## 2018-12-24 LAB — BLOOD GAS, ARTERIAL
Acid-base deficit: 2.8 mmol/L — ABNORMAL HIGH (ref 0.0–2.0)
Acid-base deficit: 2.8 mmol/L — ABNORMAL HIGH (ref 0.0–2.0)
Bicarbonate: 26.6 mmol/L (ref 20.0–28.0)
Bicarbonate: 22.7 mmol/L (ref 20.0–28.0)
Drawn by: 51133
Drawn by: 535271
FIO2: 100
FIO2: 60
MECHVT: 320 mL
MECHVT: 430 mL
O2 Saturation: 98.8 %
O2 Saturation: 97.6 %
PEEP: 5 cmH2O
PEEP: 8 cmH2O
Patient temperature: 98.6
Patient temperature: 97.8
RATE: 28 resp/min
RATE: 28 resp/min
pCO2 arterial: 96.8 mmHg (ref 32.0–48.0)
pCO2 arterial: 47.7 mmHg (ref 32.0–48.0)
pH, Arterial: 7.067 — CL (ref 7.350–7.450)
pH, Arterial: 7.297 — ABNORMAL LOW (ref 7.350–7.450)
pO2, Arterial: 319 mmHg — ABNORMAL HIGH (ref 83.0–108.0)
pO2, Arterial: 101 mmHg (ref 83.0–108.0)

## 2018-12-24 LAB — TROPONIN I (HIGH SENSITIVITY)
Troponin I (High Sensitivity): 27 ng/L — ABNORMAL HIGH (ref <18)
Troponin I (High Sensitivity): 33 ng/L — ABNORMAL HIGH (ref <18)

## 2018-12-24 LAB — CBC
HCT: 40.4 % (ref 36.0–46.0)
Hemoglobin: 12.9 g/dL (ref 12.0–15.0)
MCH: 31.3 pg (ref 26.0–34.0)
MCHC: 31.9 g/dL (ref 30.0–36.0)
MCV: 98.1 fL (ref 80.0–100.0)
Platelets: 249 10*3/uL (ref 150–400)
RBC: 4.12 MIL/uL (ref 3.87–5.11)
RDW: 15.1 % (ref 11.5–15.5)
WBC: 16.4 10*3/uL — ABNORMAL HIGH (ref 4.0–10.5)
nRBC: 0 % (ref 0.0–0.2)

## 2018-12-24 LAB — TSH: TSH: 4.025 u[IU]/mL (ref 0.350–4.500)

## 2018-12-24 LAB — MRSA PCR SCREENING: MRSA by PCR: NEGATIVE

## 2018-12-24 LAB — GLUCOSE, CAPILLARY
Glucose-Capillary: 320 mg/dL — ABNORMAL HIGH (ref 70–99)
Glucose-Capillary: 339 mg/dL — ABNORMAL HIGH (ref 70–99)
Glucose-Capillary: 258 mg/dL — ABNORMAL HIGH (ref 70–99)

## 2018-12-24 LAB — BRAIN NATRIURETIC PEPTIDE: B Natriuretic Peptide: 157.2 pg/mL — ABNORMAL HIGH (ref 0.0–100.0)

## 2018-12-24 LAB — HEMOGLOBIN A1C
Hgb A1c MFr Bld: 9 % — ABNORMAL HIGH (ref 4.8–5.6)
Mean Plasma Glucose: 211.6 mg/dL

## 2018-12-24 LAB — SARS CORONAVIRUS 2 BY RT PCR (HOSPITAL ORDER, PERFORMED IN ~~LOC~~ HOSPITAL LAB): SARS Coronavirus 2: NEGATIVE

## 2018-12-24 MED ORDER — ZOLPIDEM TARTRATE 5 MG PO TABS
5.0000 mg | ORAL_TABLET | Freq: Once | ORAL | Status: AC
  Administered 2018-12-24: 22:00:00 5 mg via ORAL
  Filled 2018-12-24 (×2): qty 1

## 2018-12-24 MED ORDER — PANTOPRAZOLE SODIUM 40 MG IV SOLR: 40.0000 mg | Freq: Every day | INTRAVENOUS | Status: DC

## 2018-12-24 MED ORDER — INFLUENZA VAC SPLIT QUAD 0.5 ML IM SUSY
0.5000 mL | PREFILLED_SYRINGE | INTRAMUSCULAR | Status: AC
  Administered 2018-12-25: 12:00:00 0.5 mL via INTRAMUSCULAR
  Filled 2018-12-24 (×2): qty 0.5

## 2018-12-24 MED ORDER — MIDAZOLAM HCL 2 MG/2ML IJ SOLN
2.0000 mg | INTRAMUSCULAR | Status: DC | PRN
  Administered 2018-12-25: 03:00:00 2 mg via INTRAVENOUS
  Filled 2018-12-24: qty 2

## 2018-12-24 MED ORDER — SODIUM CHLORIDE 0.9 % IV BOLUS
500.0000 mL | Freq: Once | INTRAVENOUS | Status: AC
  Administered 2018-12-24: 18:00:00 500 mL via INTRAVENOUS

## 2018-12-24 MED ORDER — PREDNISONE 20 MG PO TABS: 40.0000 mg | ORAL_TABLET | Freq: Every day | ORAL | Status: DC

## 2018-12-24 MED ORDER — FENTANYL BOLUS VIA INFUSION
50.0000 ug | INTRAVENOUS | Status: DC | PRN
  Administered 2018-12-24 – 2018-12-25 (×3): 50 ug via INTRAVENOUS
  Filled 2018-12-24: qty 50

## 2018-12-24 MED ORDER — PREDNISONE 20 MG PO TABS
40.0000 mg | ORAL_TABLET | Freq: Every day | ORAL | Status: DC
  Administered 2018-12-25: 40 mg
  Filled 2018-12-24: qty 2

## 2018-12-24 MED ORDER — HEPARIN SODIUM (PORCINE) 5000 UNIT/ML IJ SOLN
5000.0000 [IU] | Freq: Three times a day (TID) | INTRAMUSCULAR | Status: DC
  Administered 2018-12-24 – 2018-12-28 (×13): 5000 [IU] via SUBCUTANEOUS
  Filled 2018-12-24 (×13): qty 1

## 2018-12-24 MED ORDER — MAGNESIUM SULFATE 2 GM/50ML IV SOLN
2.0000 g | Freq: Once | INTRAVENOUS | Status: AC
  Administered 2018-12-24: 2 g via INTRAVENOUS
  Filled 2018-12-24: qty 50

## 2018-12-24 MED ORDER — ALBUTEROL (5 MG/ML) CONTINUOUS INHALATION SOLN
10.0000 mg/h | INHALATION_SOLUTION | RESPIRATORY_TRACT | Status: DC
  Administered 2018-12-24: 06:00:00 10 mg/h via RESPIRATORY_TRACT
  Filled 2018-12-24: qty 20

## 2018-12-24 MED ORDER — INSULIN ASPART 100 UNIT/ML ~~LOC~~ SOLN
0.0000 [IU] | SUBCUTANEOUS | Status: DC
  Administered 2018-12-24: 11 [IU] via SUBCUTANEOUS
  Administered 2018-12-24: 5 [IU] via SUBCUTANEOUS

## 2018-12-24 MED ORDER — SODIUM CHLORIDE 0.9 % IV SOLN: INTRAVENOUS | Status: DC | PRN

## 2018-12-24 MED ORDER — MIDAZOLAM HCL 2 MG/2ML IJ SOLN
2.0000 mg | INTRAMUSCULAR | Status: DC | PRN
  Filled 2018-12-24: qty 2

## 2018-12-24 MED ORDER — FUROSEMIDE 10 MG/ML IJ SOLN
80.0000 mg | Freq: Once | INTRAMUSCULAR | Status: AC
  Administered 2018-12-24: 08:00:00 80 mg via INTRAVENOUS
  Filled 2018-12-24 (×2): qty 8

## 2018-12-24 MED ORDER — PANTOPRAZOLE SODIUM 40 MG PO PACK
40.0000 mg | PACK | Freq: Every day | ORAL | Status: DC
  Administered 2018-12-24: 22:00:00 40 mg
  Filled 2018-12-24 (×2): qty 20

## 2018-12-24 MED ORDER — IPRATROPIUM-ALBUTEROL 0.5-2.5 (3) MG/3ML IN SOLN
3.0000 mL | RESPIRATORY_TRACT | Status: DC
  Administered 2018-12-24 – 2018-12-25 (×8): 3 mL via RESPIRATORY_TRACT
  Filled 2018-12-24 (×8): qty 3

## 2018-12-24 MED ORDER — BUDESONIDE 0.25 MG/2ML IN SUSP
0.2500 mg | Freq: Two times a day (BID) | RESPIRATORY_TRACT | Status: DC
  Administered 2018-12-24 – 2018-12-28 (×9): 0.25 mg via RESPIRATORY_TRACT
  Filled 2018-12-24 (×10): qty 2

## 2018-12-24 MED ORDER — LEVOTHYROXINE SODIUM 150 MCG PO TABS
150.0000 ug | ORAL_TABLET | Freq: Every day | ORAL | Status: DC
  Administered 2018-12-24 – 2018-12-25 (×2): 150 ug
  Filled 2018-12-24: qty 2
  Filled 2018-12-24: qty 1

## 2018-12-24 MED ORDER — CHLORHEXIDINE GLUCONATE 0.12% ORAL RINSE (MEDLINE KIT)
15.0000 mL | Freq: Two times a day (BID) | OROMUCOSAL | Status: DC
  Administered 2018-12-24 – 2018-12-25 (×3): 15 mL via OROMUCOSAL

## 2018-12-24 MED ORDER — CHLORHEXIDINE GLUCONATE CLOTH 2 % EX PADS
6.0000 | MEDICATED_PAD | Freq: Every day | CUTANEOUS | Status: DC
  Administered 2018-12-24 – 2018-12-28 (×4): 6 via TOPICAL

## 2018-12-24 MED ORDER — ARFORMOTEROL TARTRATE 15 MCG/2ML IN NEBU
15.0000 ug | INHALATION_SOLUTION | Freq: Two times a day (BID) | RESPIRATORY_TRACT | Status: DC
  Administered 2018-12-24 – 2018-12-28 (×9): 15 ug via RESPIRATORY_TRACT
  Filled 2018-12-24 (×10): qty 2

## 2018-12-24 MED ORDER — FENTANYL 2500MCG IN NS 250ML (10MCG/ML) PREMIX INFUSION
25.0000 ug/h | INTRAVENOUS | Status: DC
  Administered 2018-12-24: 25 ug/h via INTRAVENOUS
  Administered 2018-12-25: 01:00:00 125 ug/h via INTRAVENOUS
  Filled 2018-12-24 (×2): qty 250

## 2018-12-24 MED ORDER — INSULIN ASPART 100 UNIT/ML ~~LOC~~ SOLN
0.0000 [IU] | SUBCUTANEOUS | Status: DC
  Administered 2018-12-24: 15 [IU] via SUBCUTANEOUS
  Administered 2018-12-24: 20:00:00 11 [IU] via SUBCUTANEOUS
  Administered 2018-12-25 (×3): 3 [IU] via SUBCUTANEOUS
  Administered 2018-12-25: 7 [IU] via SUBCUTANEOUS

## 2018-12-24 MED ORDER — CLOPIDOGREL BISULFATE 75 MG PO TABS
75.0000 mg | ORAL_TABLET | Freq: Every day | ORAL | Status: DC
  Administered 2018-12-24 – 2018-12-25 (×2): 75 mg
  Filled 2018-12-24 (×2): qty 1

## 2018-12-24 MED ORDER — VANCOMYCIN HCL 10 G IV SOLR
1250.0000 mg | INTRAVENOUS | Status: DC
  Administered 2018-12-24 – 2018-12-25 (×2): 1250 mg via INTRAVENOUS
  Filled 2018-12-24 (×2): qty 1250

## 2018-12-24 MED ORDER — PIPERACILLIN-TAZOBACTAM 3.375 G IVPB
3.3750 g | Freq: Three times a day (TID) | INTRAVENOUS | Status: DC
  Administered 2018-12-24 – 2018-12-25 (×3): 3.375 g via INTRAVENOUS
  Filled 2018-12-24 (×4): qty 50

## 2018-12-24 MED ORDER — ORAL CARE MOUTH RINSE
15.0000 mL | OROMUCOSAL | Status: DC
  Administered 2018-12-24 – 2018-12-25 (×9): 15 mL via OROMUCOSAL

## 2018-12-24 MED ORDER — FUROSEMIDE 10 MG/ML IJ SOLN
80.0000 mg | Freq: Two times a day (BID) | INTRAMUSCULAR | Status: AC
  Administered 2018-12-24 – 2018-12-25 (×2): 80 mg via INTRAVENOUS
  Filled 2018-12-24 (×2): qty 8

## 2018-12-24 MED ORDER — IPRATROPIUM BROMIDE 0.02 % IN SOLN
0.5000 mg | Freq: Once | RESPIRATORY_TRACT | Status: AC
  Administered 2018-12-24: 0.5 mg via RESPIRATORY_TRACT
  Filled 2018-12-24: qty 2.5

## 2018-12-24 MED ORDER — FUROSEMIDE 10 MG/ML IJ SOLN: 80.0000 mg | Freq: Two times a day (BID) | INTRAMUSCULAR | Status: DC

## 2018-12-24 MED ORDER — FENTANYL CITRATE (PF) 100 MCG/2ML IJ SOLN
50.0000 ug | Freq: Once | INTRAMUSCULAR | Status: AC
  Administered 2018-12-24: 05:00:00 50 ug via INTRAVENOUS
  Filled 2018-12-24: qty 2

## 2018-12-24 NOTE — Progress Notes (Signed)
eLink Physician-Brief Progress Note Patient Name: Crystal Yates DOB: [birth year] MRN: [id number]   Date of Service  12/24/2018  HPI/Events of Note  Patient requesting for Ambien which she takes at home. Seen awake, intubated on Fentanyl 125 mg/hr.  eICU Interventions  Ordered a dose of Ambien 5 mg for sleep as per patient's request.     Intervention Category Minor Interventions: Other:  Judd Lien 12/24/2018, 8:25 PM

## 2018-12-24 NOTE — H&P (Signed)
NAME:  Crystal Yates, MRN:  [id number], DOB:  1955/08/26, LOS: 0 ADMISSION DATE:  12/24/2018, CONSULTATION DATE:  12/25/18  CHIEF COMPLAINT:  Increased WOB  Brief History   This is a 63 yo with history of DM, COPD, Sjogrens, empty sella, HF and bulging disc who presents with combined hypoxic and hypercapnic respiratory failure secondary to COPD and Heart failure  History of present illness   This is a 64 yo with a history of DM, COPD, Sjogrens, empty sella, HF on lasix, and bulging disc. Husband notes that for the past few days. Is using inhalers and albuterol.Has been using CPAP to go to bed. She put it on like usual like last night. However last night she woke up and had to sit up. Hubsand heard her moaning. Started rubbing her back. She lost control of her bladder. After 15 minutes not subsiding. Thus EMS was called. En rout patient appeared to be doing well. Right on arrival to ED patient began to become less responsive and required bagging. There was question as to if patient ever required CPR. No pulse was lost. No meds given. PAteint intubated after patient continued to have AMS and ABG intially 7.067/96.8/ and PA02 319  She continues to smoke about 1/2 ppd. Used to smoked 1 PPD. Has smoked about 50 years. Had a stent placed not to long ago. Has been tested 2 times previously and has never had covid. Has had increased cough and SOB. Has had increased swelling and weight gain. No fevers or chills recently. Her multiple admissions for respiratory problems  has been going on for a year. Had to be placed on oxygen for this was only on oxygen at night up until 1 year ago. She is usually on 2 liters.   Past Medical History  DM Sjogrens Emty stella HF Bulging disc  Significant Hospital Events   Intubated on 9/28  Consults:  NA  Procedures:  Intubated on 9/28  Significant Diagnostic Tests:  COVID negative CT head negative after intubation  Micro Data:  Sputum culture and  blood cultures to be attained Covid negative  Antimicrobials:  Vanc and zosyn for now  Interim history/subjective:  NA  Objective   Blood pressure 138/70, pulse (!) 108, resp. rate (!) 28, height 5\' 4"  (1.626 m), weight 102.1 kg, SpO2 98 %.    Vent Mode: Other (Comment) FiO2 (%):  [60 %] 60 % Set Rate:  [28 bmp] 28 bmp Vt Set:  [320 mL] 320 mL PEEP:  [5 cmH20] 5 cmH20 Plateau Pressure:  [25 cmH20] 25 cmH20  No intake or output data in the 24 hours ending 12/24/18 0554 Filed Weights   12/24/18 0421  Weight: 102.1 kg    Examination: General: Patient intubated with unequal pupil left is  Mm right is 80mm HENT: Intubated. No lesions appreciated Lungs: Expiratory wheezes noted bialterally Cardiovascular:RRR no rubs or murmurs noted Abdomen: Soft non tender and non distended Extremities: No deformities noted Neuro: Following commands intermittenty but sedated and just recently intubated GU: Deferred  Resolved Hospital Problem list   NA  Assessment & Plan:  This is a 63 yo with history as noted above who presents for chief complaint of increased work of breathing found to have combined hypercapnic and hypoxic respiratory failure   Combined hypercapnic and hypoxic respiratory failure 2/2 COPD and HFrEF exacerbation. Husband notes both increased sputum and swelling in extremities. She has been to the hospital at least 5 times this year for her breathing and  been intubated 2 times. Taking medications as she should but husband cannot recall which medications. Looking at merged chart it appears patient should be on trelegy at home. -Diurese with IV 80 lasix now as patients BP stable -Will not repeat echo as has recent one in August of 35-45% -Brovana -Pulmicort -Prednisone 40 -Scheduled duonebs -Vanc and zosyn for now as patient with recent hospital admissions and severe enough exacerbation to require intubation -Sputum culture and blood culture -ABG Q 6hr -Low tidal volumes for  now to avoid air trapping and will decrease rate if needed. Does not appear to have large volume of trapping via vent graphics at this time    DM -Recent A1c was 8.6 on 5/12 -She is on chronic steroids and will be receiving prednisone while here -Continue home Tresiba -Will cover with moderate-scale SSI including qhs for now  Stage 3 CKD -Recent AKI but appears to be at/near baseline at this time -Recheck BMP in AM  Chronic HFrEF -Recent Echo with EF 35-40% -Appears overlaoded -Diurese for net negative of 2 liters today  Hypothyroidism -Recent normal TSH -Will repeat while here -Continue Synthroid at current dose for now  Obesity -BMI 40 -Outpatient bariatric medical or surgical referral should be considered  Glucocorticoid dependence taking 5-15 mg of hydrocortisone at home. Liekly from empty sella syndrome -On hydrocortisone at home -Continue hydrocortisone when off prednisone   Ongoing tobacco dependence -Encourage cessation.   -Patient still smoking about half a pack per day -Cotninue to counsel patient when extubated   Note: This patient has been tested and is negative for the novel coronavirus COVID-19.       Best practice:  Diet: NPO for now Pain/Anxiety/Delirium protocol (if indicated): Fentanyl drip VAP protocol (if indicated): HOB 30 degrees DVT prophylaxis: Heaprin sub q GI prophylaxis: Protonix for now Glucose control: Sliding scale Mobility: As tolerated Code Status:Full per husband Family Communication: Called and spoke with husband Disposition: TBD  Labs   CBC: Recent Labs  Lab 12/24/18 0418  WBC 12.1*  NEUTROABS 7.2  HGB 13.0  HCT 42.4  MCV 97.9  PLT [id number]    Basic Metabolic Panel: Recent Labs  Lab 12/24/18 0418  NA 139  K 4.0  CL 107  CO2 23  GLUCOSE 296*  BUN 23  CREATININE 1.25*  CALCIUM 9.1   GFR: Estimated Creatinine Clearance: 53.6 mL/min (A) (by C-G formula based on SCr of 1.25 mg/dL (H)). Recent Labs   Lab 12/24/18 0418  WBC 12.1*    Liver Function Tests: Recent Labs  Lab 12/24/18 0418  AST 30  ALT 23  ALKPHOS 75  BILITOT 0.4  PROT 6.2*  ALBUMIN 2.8*   No results for input(s): LIPASE, AMYLASE in the last 168 hours. No results for input(s): AMMONIA in the last 168 hours.  ABG    Component Value Date/Time   PHART 7.067 (LL) 12/24/2018 0415   PCO2ART 96.8 (HH) 12/24/2018 0415   PO2ART 319 (H) 12/24/2018 0415   HCO3 26.6 12/24/2018 0415   ACIDBASEDEF 2.8 (H) 12/24/2018 0415   O2SAT 98.8 12/24/2018 0415     Coagulation Profile: No results for input(s): INR, PROTIME in the last 168 hours.  Cardiac Enzymes: No results for input(s): CKTOTAL, CKMB, CKMBINDEX, TROPONINI in the last 168 hours.  HbA1C: No results found for: HGBA1C  CBG: Recent Labs  Lab 12/24/18 0407  GLUCAP 277*    Review of Systems:   Pertinent positive and negatives per HPI Unable to attain full ROS as patient  intubated  Past Medical History  She,  has no past medical history on file.  Is on prednisone at home   Surgical History     ABDOMINAL SURGERY     . CESAREAN SECTION    . CHOLECYSTECTOMY    . COLON RESECTION    . COLONOSCOPY WITH PROPOFOL N/A 01/16/2018   Procedure: COLONOSCOPY WITH PROPOFOL;  Surgeon: Rush Landmark Telford Nab., MD;  Location: [facility];  Service: Gastroenterology;  Laterality: N/A;  . CORONARY BALLOON ANGIOPLASTY N/A 10/13/2017   Procedure: CORONARY BALLOON ANGIOPLASTY;  Surgeon: Belva Crome, MD;  Location: [facility];  Service: Cardiovascular;  Laterality: N/A;  . HERNIA MESH REMOVAL    . HERNIA REPAIR    . LEFT HEART CATH AND CORONARY ANGIOGRAPHY N/A 10/13/2016   Procedure: Left Heart Cath and Coronary Angiography;  Surgeon: Nelva Bush, MD;  Location: [facility];  Service: Cardiovascular;  Laterality: N/A;  . POLYPECTOMY  01/16/2018   Procedure: POLYPECTOMY;  Surgeon: Rush Landmark Telford Nab., MD;  Location: [facility];   Service: Gastroenterology;;  . RIGHT/LEFT HEART CATH AND CORONARY ANGIOGRAPHY N/A 10/13/2017   Procedure: RIGHT/LEFT HEART CATH AND CORONARY ANGIOGRAPHY;  Surgeon: Belva Crome, MD;  Location: [facility];  Service: Cardiovascular;  Laterality: N/A;  . SHOULDER ARTHROSCOPY    . TUBAL LIGATION       Social History     Still smoking 1/2ppd  Family History   Her family history is not on file.   Allergies Allergies  Allergen Reactions  . Buprenorphine   . Hydroxychloroquine   . Prednisone   . Tape   . Varenicline      Home Medications  Prior to Admission medications   Not on File   Prednisone  Critical care time: 45 minutes

## 2018-12-24 NOTE — Progress Notes (Signed)
RT transported pt to and from CT without event. 

## 2018-12-24 NOTE — Progress Notes (Signed)
Pt transported from ED 32 to 2M09 on ventilator. Pt stable throughout with no complications. Report given at bedside to receiving RT.

## 2018-12-24 NOTE — Progress Notes (Signed)
Pharmacy Antibiotic Note  Marthe Baran is a 63 y.o. female admitted on 12/24/2018 with pneumonia.  Pharmacy has been consulted for Vancomycin/Zosyn dosing. WBC mildly elevated. Small bump in Scr. Respiratory distress requiring intubation.   Plan: Vancomycin 1250 mg IV q24h >>Estimated AUC: 502 Zosyn 3.375G IV q8h to be infused over 4 hours Trend WBC, temp, renal function  F/U infectious work-up Drug levels as indicated   Height: 5\' 4"  (162.6 cm) Weight: 225 lb (102.1 kg) IBW/kg (Calculated) : 54.7  No data recorded.  Recent Labs  Lab 12/24/18 0418  WBC 12.1*  CREATININE 1.25*    Estimated Creatinine Clearance: 53.6 mL/min (A) (by C-G formula based on SCr of 1.25 mg/dL (H)).    Allergies  Allergen Reactions  . Buprenorphine   . Hydroxychloroquine   . Prednisone   . Tape   . Varenicline     Narda Bonds, PharmD, [place] Clinical Pharmacist Phone: [phone]

## 2018-12-24 NOTE — ED Provider Notes (Signed)
[facility] EMERGENCY DEPARTMENT Provider Note  CSN: [id number] Arrival date & time: 12/24/18 0343  Chief Complaint(s) Respiratory Distress  HPI Crystal Yates is a 63 y.o. female with a past medical history of COPD and CHF who presents to the emergency department with shortness of breath.  She was brought in by EMS.  EMS reported that the patient had decreased breath sounds throughout.  In route she was getting duo nebs when she became unresponsive just prior to arrival requiring bag-valve-mask.  Remainder of history, ROS, and physical exam limited due to patient's condition (unresponsive). Additional information was obtained from EMS.   Level V Caveat.    HPI  Past Medical History No past medical history on file. There are no active problems to display for this patient.  Home Medication(s) Prior to Admission medications   Not on File                                                                                                                                    Past Surgical History ** The histories are not reviewed yet. Please review them in the "History" navigator section and refresh this [place]. Family History No family history on file.  Social History Social History   Tobacco Use  . Smoking status: Not on file  Substance Use Topics  . Alcohol use: Not on file  . Drug use: Not on file   Allergies Buprenorphine, Hydroxychloroquine, Prednisone, Tape, and Varenicline  Review of Systems Review of Systems  Unable to perform ROS: Patient unresponsive    Physical Exam Vital Signs  I have reviewed the triage vital signs BP (!) 150/87   Pulse (!) 129   Resp 15   Ht 5\' 4"  (1.626 m)   Wt 102.1 kg   SpO2 38%  BMI 38.62 kg/m   Physical Exam Vitals signs reviewed.  Constitutional:      General: She is in acute distress.     Appearance: She is well-developed. She is morbidly obese. She is ill-appearing and diaphoretic.  HENT:    Head: Normocephalic and atraumatic.     Nose: Nose normal.     Mouth/Throat:     Comments: cyanotic Eyes:     General: No scleral icterus.       Right eye: No discharge.        Left eye: No discharge.     Conjunctiva/sclera: Conjunctivae normal.     Pupils: Pupils are equal, round, and reactive to light.  Neck:     Musculoskeletal: Normal range of motion and neck supple.  Cardiovascular:     Rate and Rhythm: Regular rhythm. Tachycardia present.     Heart sounds: No murmur. No friction rub. No gallop.   Pulmonary:     Effort: Prolonged expiration and respiratory distress present. No tachypnea or bradypnea.     Breath sounds: Decreased air movement present. No stridor. No rales.  Comments: Agonal breathing Abdominal:     General: There is no distension.     Palpations: Abdomen is soft.     Tenderness: There is no abdominal tenderness.    Musculoskeletal:        General: No tenderness.     Right lower leg: 2+ Pitting Edema present.     Left lower leg: 2+ Pitting Edema present.  Skin:    General: Skin is warm.     Findings: No erythema or rash.  Neurological:     Mental Status: She is unresponsive.     Motor: Abnormal muscle tone (posturing) present.     ED Results and Treatments Labs (all labs ordered are listed, but only abnormal results are displayed) Labs Reviewed  BLOOD GAS, ARTERIAL - Abnormal; Notable for the following components:      Result Value   pH, Arterial 7.067 (*)    pCO2 arterial 96.8 (*)    pO2, Arterial 319 (*)    Acid-base deficit 2.8 (*)    All other components within normal limits  CBC WITH DIFFERENTIAL/PLATELET - Abnormal; Notable for the following components:   WBC 12.1 (*)    Abs Immature Granulocytes 0.28 (*)    All other components within normal limits  COMPREHENSIVE METABOLIC PANEL - Abnormal; Notable for the following components:   Glucose, Bld 296 (*)    Creatinine, Ser 1.25 (*)    Total Protein 6.2 (*)    Albumin 2.8 (*)    GFR  calc non Af Amer 46 (*)    GFR calc Af Amer 53 (*)    All other components within normal limits  BRAIN NATRIURETIC PEPTIDE - Abnormal; Notable for the following components:   B Natriuretic Peptide 157.2 (*)    All other components within normal limits  CBG MONITORING, ED - Abnormal; Notable for the following components:   Glucose-Capillary 277 (*)    All other components within normal limits  TROPONIN I (HIGH SENSITIVITY) - Abnormal; Notable for the following components:   Troponin I (High Sensitivity) 27 (*)    All other components within normal limits  SARS CORONAVIRUS 2 (HOSPITAL ORDER, [facility])  TROPONIN I (HIGH SENSITIVITY)                                                                                                                         EKG  EKG Interpretation  Date/Time:  Monday December 24 2018 04:25:48 EDT Ventricular Rate:  126 PR Interval:    QRS Duration: 126 QT Interval:  439 QTC Calculation: 636 R Axis:   79 Text Interpretation:  Sinus tachycardia LAE, consider biatrial enlargement Nonspecific intraventricular conduction delay Nonspecific T abnrm, anterolateral leads NO STEMI. No old tracing to compare Confirmed by Addison Lank [phone]) on 12/24/2018 4:39:49 AM      Radiology Dg Chest Portable 1 View  Result Date: 12/24/2018 CLINICAL DATA:  Respiratory distress with intubation EXAM: PORTABLE CHEST 1 VIEW COMPARISON:  11/17/2018  FINDINGS: Endotracheal tube tip just below the clavicular heads. The orogastric tube at least reaches the stomach. Streaky density at both lung bases from atelectasis and or bronchitis. The lung volumes are large. No edema, effusion, or pneumothorax. IMPRESSION: 1. Unremarkable hardware positioning. 2. Bronchitic/atelectatic markings at the bases. Electronically Signed   By: Monte Fantasia M.D.   On: 12/24/2018 04:39    Pertinent labs & imaging results that were available during my care of the patient were  reviewed by me and considered in my medical decision making (see chart for details).  Medications Ordered in ED Medications  albuterol (PROVENTIL,VENTOLIN) solution continuous neb (has no administration in time range)  ipratropium (ATROVENT) nebulizer solution 0.5 mg (has no administration in time range)  fentaNYL 2535mcg in NS 279mL (30mcg/ml) infusion-PREMIX (25 mcg/hr Intravenous New Bag/Given 12/24/18 0444)  fentaNYL (SUBLIMAZE) bolus via infusion 50 mcg (has no administration in time range)  midazolam (VERSED) injection 2 mg (has no administration in time range)  midazolam (VERSED) injection 2 mg (has no administration in time range)  magnesium sulfate IVPB 2 g 50 mL (2 g Intravenous New Bag/Given 12/24/18 0441)  fentaNYL (SUBLIMAZE) injection 50 mcg (50 mcg Intravenous Given 12/24/18 0440)                                                                                                                                    Procedures .Critical Care Performed by: Fatima Blank, MD Authorized by: Fatima Blank, MD     CRITICAL CARE Performed by: Grayce Sessions Ettamae Barkett Total critical care time: 50 minutes Critical care time was exclusive of separately billable procedures and treating other patients. Critical care was necessary to treat or prevent imminent or life-threatening deterioration. Critical care was time spent personally by me on the following activities: development of treatment plan with patient and/or surrogate as well as nursing, discussions with consultants, evaluation of patient's response to treatment, examination of patient, obtaining history from patient or surrogate, ordering and performing treatments and interventions, ordering and review of laboratory studies, ordering and review of radiographic studies, pulse oximetry and re-evaluation of patient's condition.   (including critical care time)  Medical Decision Making / ED Course I have reviewed the  nursing notes for this encounter and the patient's prior records (if available in EHR or on provided paperwork).   Crystal Yates was evaluated in Emergency Department on 12/24/2018 for the symptoms described in the history of present illness. She was evaluated in the context of the global COVID-19 pandemic, which necessitated consideration that the patient might be at risk for infection with the SARS-CoV-2 virus that causes COVID-19. Institutional protocols and algorithms that pertain to the evaluation of patients at risk for COVID-19 are in a state of rapid change based on information released by regulatory bodies including the CDC and federal and state organizations. These policies and algorithms were followed during the patient's care in the ED.  Respiratory distress.  Who became unresponsive in route Setting 30%.  Pulses intact.  Bag-valve-mask with PEEP was initiated.  Reached saturations of 93%. Intubated for airway protection by Antonietta Breach, PA. See note.  CXR  With pulmonary edema. No PTX. ETT in good position. ABG with respiratory acidosis.  Rate adjusted.  EKG with sinus tachy, no significant ischemic changes.  Mildly elevated trop, likely demand. Mild leukocytosis. Hyperglycemia w/o DKA. Otherwise labs reassuring. CT head to assess for anoxic brain injury reassuring.  Discussed with CC for admission.  6:00 AM Repeating ABG.  Improving acidosis and CO2      Final Clinical Impression(s) / ED Diagnoses Final diagnoses:  Acute on chronic respiratory failure with hypoxia and hypercapnia ([place])      This chart was dictated using voice recognition software.  Despite best efforts to proofread,  errors can occur which can change the documentation meaning.   Fatima Blank, MD 12/24/18 [phone]

## 2018-12-24 NOTE — ED Notes (Addendum)
Emergency intubation: Leonette Monarch, MD; Claiborne Billings, PA, 3 RNS, Donna Christen, [place]., Yvone Neu, EMT, all pertinent info verified; Etomidate 30mg  administered by Andruw, RN and Rocronium 100mg  administered by Andruw, RN; pt successfully intubated at 352a.

## 2018-12-24 NOTE — Progress Notes (Signed)
NAME:  Crystal Yates, MRN:  [id number], DOB:  Jan 19, 1956, LOS: 0 ADMISSION DATE:  12/24/2018, CONSULTATION DATE:  12/25/18  CHIEF COMPLAINT:  Increased WOB  Brief History   [name] is a 63 yo F w/ PMH of DM, COPD, Sjogrens, HFrEF, CAD, tobacco use, and obesity who presented with acute on chronic combined hypoxic and hypercapnic respiratory failure requiring intubation secondary to COPD and CHF exacerbation admit to ICU.  Past Medical History  DM Sjogrens COPD on 2L at home Combined systolic/diastolic heart failure CAD s/p stents OHS OSA Tobacco use  Significant Hospital Events   Intubated on 9/28  Consults:  NA  Procedures:  Intubated on 9/28  Significant Diagnostic Tests:  9/28 COVID negative 9/28 CT head negative after intubation  Micro Data:  9/25 Sputum culture 9/28 blood culture  Antimicrobials:  9/25 Vancomycin 9/25 Zosyn  Interim history/subjective:  [name] was examined and evaluated at bedside this AM. She opens eyes spontaneously and is able to answer yes/no questions and follow directions. She was given mild sedation per nursing staff and appears sleepy.  Objective   Blood pressure (!) 102/53, pulse (!) 103, resp. rate (!) 29, height 5\' 4"  (1.626 m), weight 102.1 kg, SpO2 99 %.    Vent Mode: PRVC FiO2 (%):  [60 %-70 %] 60 % Set Rate:  [28 bmp] 28 bmp Vt Set:  [320 mL] 320 mL PEEP:  [5 cmH20-8 cmH20] 8 cmH20 Plateau Pressure:  [24 cmH20-25 cmH20] 24 cmH20   Intake/Output Summary (Last 24 hours) at 12/24/2018 1058 Last data filed at 12/24/2018 0940 Gross per 24 hour  Intake 250.05 ml  Output --  Net 250.05 ml   Filed Weights   12/24/18 0421  Weight: 102.1 kg   Examination: Gen: Obese, NAD HEENT: NCAT head, EOMI,  MMM, ETT in place and functioning CV: RRR, S1, S2 normal, No rubs, no murmurs, no gallops Pulm: + expiratory wheezes, mild bibasilar rales   Abd: Soft, BS+, NTND Extm: ROM intact, Peripheral pulses intact,  2+ peripheral edema Skin: Dry, Warm, ecchymosis on upper extremities Neuro: Alert, on minimal sedation  Resolved Hospital Problem list   NA  Assessment & Plan:   Acute on chronic hypercapnic, hypoxic respiratory failure COPD exacerbation Chest X-ray w/ evidence of hyperinflated lungs with flattened diaphragm. No significant pulm edema. Abg w/ significant hypercarbic resp failure: pH 7.151, pCO2 77.9. Desaturation event in ED down to 60s. Chart review shows multiple recent admissions for acute resp failure w/ similar presentation. - C/w intubation TV 8cc/kg, RR @ 28 - Wean FiO2, PEEP down but keep O2 sat >90 - VAP protocol - Minimize sedation (currentlyonly on fentanyl - Duoneb q4hr - Prednisone 40mg  daily - C/w pulmicort, brovana - Trend ABGs - Will transition to Bipap when appropriate  Hypotension Acute on chronic systolic heart failure exacerbation Started on vanc/zosyn in ED due to hypotension which has now resolved. TTE 10/2018: EF 35-40%. On lasix 40mg  at home. BNP on admission 160, which is at baseline. Appear hypervolemic one xam. - Low threshold to d/c vanc/zosyn (if afebrile, can d/c tomorrow) - IV furosemide 80mg  BID - Trend BMP - Mag level - Strict I&Os - Daily Weights - Fluid restriction - Keep O2 sat >88 - Replenish K as needed >4.0 - Holding home oral meds while intubated  DM Hgb a1c on admissio 9.0, Receiving steroids for multiple COPD exacerbation. - SSI - Glucose checks  Stage 3 CKD Admit creatinine 1.25. Baseline createin ~1.1 - Trend renal fx -  Avoid nephrotoxic meds when appropriate  Hypothyroidism On levothyroxine 15mcg at home - C/w home meds  Ongoing tobacco dependence -Encourage cessation.   -Patient still smoking about half a pack per day -Cotninue to counsel patient when extubated  Best practice:  Diet: NPO for now Pain/Anxiety/Delirium protocol (if indicated): Fentanyl drip VAP protocol (if indicated): HOB 30 degrees DVT  prophylaxis: Heaprin sub q GI prophylaxis: Protonix for now Glucose control: Sliding scale Mobility: As tolerated Code Status:Full per husband Family Communication: Will update husband via phone Disposition: TBD  Labs   CBC: Recent Labs  Lab 12/24/18 0418 12/24/18 0608 12/24/18 0712 12/24/18 0750  WBC 12.1*  --   --  16.4*  NEUTROABS 7.2  --   --   --   HGB 13.0 12.9 13.6 12.9  HCT 42.4 38.0 40.0 40.4  MCV 97.9  --   --  98.1  PLT 248  --   --  0000000    Basic Metabolic Panel: Recent Labs  Lab 12/24/18 0418 12/24/18 0608 12/24/18 0712  NA 139 141 141  K 4.0 4.0 4.1  CL 107  --   --   CO2 23  --   --   GLUCOSE 296*  --   --   BUN 23  --   --   CREATININE 1.25*  --   --   CALCIUM 9.1  --   --    GFR: Estimated Creatinine Clearance: 53.6 mL/min (A) (by C-G formula based on SCr of 1.25 mg/dL (H)). Recent Labs  Lab 12/24/18 0418 12/24/18 0750  WBC 12.1* 16.4*    Liver Function Tests: Recent Labs  Lab 12/24/18 0418  AST 30  ALT 23  ALKPHOS 75  BILITOT 0.4  PROT 6.2*  ALBUMIN 2.8*   No results for input(s): LIPASE, AMYLASE in the last 168 hours. No results for input(s): AMMONIA in the last 168 hours.  ABG    Component Value Date/Time   PHART 7.151 (LL) 12/24/2018 0712   PCO2ART 77.9 (HH) 12/24/2018 0712   PO2ART 74.0 (L) 12/24/2018 0712   HCO3 27.2 12/24/2018 0712   TCO2 30 12/24/2018 0712   ACIDBASEDEF 4.0 (H) 12/24/2018 0712   O2SAT 89.0 12/24/2018 0712     Coagulation Profile: No results for input(s): INR, PROTIME in the last 168 hours.  Cardiac Enzymes: No results for input(s): CKTOTAL, CKMB, CKMBINDEX, TROPONINI in the last 168 hours.  HbA1C: Hgb A1c MFr Bld  Date/Time Value Ref Range Status  12/24/2018 07:50 AM 9.0 (H) 4.8 - 5.6 % Final    Comment:    (NOTE) Pre diabetes:          5.7%-6.4% Diabetes:              >6.4% Glycemic control for   <7.0% adults with diabetes     CBG: Recent Labs  Lab 12/24/18 0407 12/24/18 0745    GLUCAP 277* 239*    Review of Systems:   Pertinent positive and negatives per HPI Unable to attain full ROS as patient intubated  Past Medical History  She,  has no past medical history on file.  Is on prednisone at home   Surgical History     ABDOMINAL SURGERY      CESAREAN SECTION     CHOLECYSTECTOMY     COLON RESECTION     COLONOSCOPY WITH PROPOFOL N/A 01/16/2018   Procedure: COLONOSCOPY WITH PROPOFOL;  Surgeon: Rush Landmark Telford Nab., MD;  Location: [facility];  Service: Gastroenterology;  Laterality: N/A;  CORONARY BALLOON ANGIOPLASTY N/A 10/13/2017   Procedure: CORONARY BALLOON ANGIOPLASTY;  Surgeon: Belva Crome, MD;  Location: [facility];  Service: Cardiovascular;  Laterality: N/A;   HERNIA MESH REMOVAL     HERNIA REPAIR     LEFT HEART CATH AND CORONARY ANGIOGRAPHY N/A 10/13/2016   Procedure: Left Heart Cath and Coronary Angiography;  Surgeon: Nelva Bush, MD;  Location: [facility];  Service: Cardiovascular;  Laterality: N/A;   POLYPECTOMY  01/16/2018   Procedure: POLYPECTOMY;  Surgeon: Rush Landmark Telford Nab., MD;  Location: [facility] END[facility];  Service: Gastroenterology;;   RIGHT/LEFT HEART CATH AND CORONARY ANGIOGRAPHY N/A 10/13/2017   Procedure: RIGHT/LEFT HEART CATH AND CORONARY ANGIOGRAPHY;  Surgeon: Belva Crome, MD;  Location: [facility];  Service: Cardiovascular;  Laterality: N/A;   SHOULDER ARTHROSCOPY     TUBAL LIGATION       Social History     Still smoking 1/2ppd  Family History   Her family history is not on file.   Allergies Allergies  Allergen Reactions   Buprenorphine    Hydroxychloroquine    Prednisone    Tape    Varenicline      Home Medications  Prior to Admission medications   Not on File   Prednisone  Critical care time:     Gilberto Better, MD PGY-2, [place] [facility] Pager: [phone]  Attending attestation to follow

## 2018-12-24 NOTE — ED Triage Notes (Signed)
Pt arrived via [facility]; pt being bagged upon arrival; emergency intubation done; Pt from hm and initially responsive; pt was breathing 93% on CPAP; Pt rec'd (1) albuterol, (1) duoneb, and 125mg  of Solu-medrol; 136/80, 124HR, 32R, 95% on 15L NRB

## 2018-12-24 NOTE — Progress Notes (Signed)
Inpatient Diabetes Program Recommendations  AACE/ADA: New Consensus Statement on Inpatient Glycemic Control (2015)  Target Ranges:  Prepandial:   less than 140 mg/dL      Peak postprandial:   less than 180 mg/dL (1-2 hours)      Critically ill patients:  140 - 180 mg/dL   Lab Results  Component Value Date   GLUCAP 320 (H) 12/24/2018   HGBA1C 9.0 (H) 12/24/2018    Review of Glycemic Control  Diabetes history: DM2 Outpatient Diabetes medications: Tresiba - unsure of amt Current orders for Inpatient glycemic control: Novolog 0-15 units Q4H, Pred 40 mg QD  HgbA1C - 9% - needs tighter control.  Inpatient Diabetes Program Recommendations:     ICU Glycemic Control Order Set.  Will f/u with pt when appropriate regarding glucose control.   Thank you. Lorenda Peck, RD, LDN, CDE Inpatient Diabetes Coordinator [phone]

## 2018-12-24 NOTE — ED Provider Notes (Signed)
INTUBATION Performed by: Antonietta Breach  Required items: required blood products, implants, devices, and special equipment available Patient identity confirmed: provided demographic data and hospital-assigned identification number Time out: Immediately prior to procedure a "time out" was called to verify the correct patient, procedure, equipment, support staff and site/side marked as required.  Indications: AMS, hypoxia  Intubation method: Glidescope Laryngoscopy   Preoxygenation: BVM  Sedatives: 30mg  Etomidate Paralytic: 100mg  Rocuronium  Tube Size: 7.5 cuffed  Post-procedure assessment: chest rise and ETCO2 monitor Breath sounds: equal and absent over the epigastrium Tube secured with: ETT holder Chest x-ray interpreted by radiologist and me.  Chest x-ray findings: Endotracheal tube in appropriate position  Patient tolerated the procedure well with no immediate complications.    Antonietta Breach, PA-C 12/24/18 [place], [place], MD 12/24/18 [phone]

## 2018-12-25 ENCOUNTER — Encounter (HOSPITAL_COMMUNITY): Payer: Self-pay | Admitting: Emergency Medicine

## 2018-12-25 ENCOUNTER — Inpatient Hospital Stay (HOSPITAL_COMMUNITY): Payer: Federal, State, Local not specified - PPO

## 2018-12-25 ENCOUNTER — Other Ambulatory Visit: Payer: Self-pay

## 2018-12-25 DIAGNOSIS — J9690 Respiratory failure, unspecified, unspecified whether with hypoxia or hypercapnia: Secondary | ICD-10-CM | POA: Diagnosis not present

## 2018-12-25 DIAGNOSIS — J9601 Acute respiratory failure with hypoxia: Secondary | ICD-10-CM

## 2018-12-25 DIAGNOSIS — Z4682 Encounter for fitting and adjustment of non-vascular catheter: Secondary | ICD-10-CM | POA: Diagnosis not present

## 2018-12-25 LAB — CBC
HCT: 36.7 % (ref 36.0–46.0)
Hemoglobin: 12.1 g/dL (ref 12.0–15.0)
MCH: 30.5 pg (ref 26.0–34.0)
MCHC: 33 g/dL (ref 30.0–36.0)
MCV: 92.4 fL (ref 80.0–100.0)
Platelets: 186 10*3/uL (ref 150–400)
RBC: 3.97 MIL/uL (ref 3.87–5.11)
RDW: 14.8 % (ref 11.5–15.5)
WBC: 13.2 10*3/uL — ABNORMAL HIGH (ref 4.0–10.5)
nRBC: 0 % (ref 0.0–0.2)

## 2018-12-25 LAB — BLOOD GAS, ARTERIAL
Acid-Base Excess: 1.8 mmol/L (ref 0.0–2.0)
Bicarbonate: 25.8 mmol/L (ref 20.0–28.0)
Drawn by: 41875
FIO2: 40
MECHVT: 430 mL
O2 Saturation: 96.8 %
PEEP: 8 cmH2O
Patient temperature: 98.6
RATE: 28 resp/min
pCO2 arterial: 39.5 mmHg (ref 32.0–48.0)
pH, Arterial: 7.43 (ref 7.350–7.450)
pO2, Arterial: 84.7 mmHg (ref 83.0–108.0)

## 2018-12-25 LAB — BASIC METABOLIC PANEL
Anion gap: 9 (ref 5–15)
BUN: 30 mg/dL — ABNORMAL HIGH (ref 8–23)
CO2: 24 mmol/L (ref 22–32)
Calcium: 8.8 mg/dL — ABNORMAL LOW (ref 8.9–10.3)
Chloride: 109 mmol/L (ref 98–111)
Creatinine, Ser: 1.56 mg/dL — ABNORMAL HIGH (ref 0.44–1.00)
GFR calc Af Amer: 41 mL/min — ABNORMAL LOW (ref >60)
GFR calc non Af Amer: 35 mL/min — ABNORMAL LOW (ref >60)
Glucose, Bld: 164 mg/dL — ABNORMAL HIGH (ref 70–99)
Potassium: 3.6 mmol/L (ref 3.5–5.1)
Sodium: 142 mmol/L (ref 135–145)

## 2018-12-25 LAB — PHOSPHORUS: Phosphorus: 2 mg/dL — ABNORMAL LOW (ref 2.5–4.6)

## 2018-12-25 LAB — MAGNESIUM: Magnesium: 1.9 mg/dL (ref 1.7–2.4)

## 2018-12-25 LAB — GLUCOSE, CAPILLARY
Glucose-Capillary: 103 mg/dL — ABNORMAL HIGH (ref 70–99)
Glucose-Capillary: 123 mg/dL — ABNORMAL HIGH (ref 70–99)
Glucose-Capillary: 125 mg/dL — ABNORMAL HIGH (ref 70–99)
Glucose-Capillary: 132 mg/dL — ABNORMAL HIGH (ref 70–99)
Glucose-Capillary: 204 mg/dL — ABNORMAL HIGH (ref 70–99)
Glucose-Capillary: 254 mg/dL — ABNORMAL HIGH (ref 70–99)
Glucose-Capillary: 285 mg/dL — ABNORMAL HIGH (ref 70–99)

## 2018-12-25 LAB — HIV ANTIBODY (ROUTINE TESTING W REFLEX): HIV Screen 4th Generation wRfx: NONREACTIVE

## 2018-12-25 MED ORDER — PREDNISONE 10 MG PO TABS: 10.0000 mg | ORAL_TABLET | Freq: Every day | ORAL | Status: DC

## 2018-12-25 MED ORDER — CLOPIDOGREL BISULFATE 75 MG PO TABS
75.0000 mg | ORAL_TABLET | Freq: Every day | ORAL | Status: DC
  Administered 2018-12-26 – 2018-12-28 (×3): 75 mg via ORAL
  Filled 2018-12-25 (×3): qty 1

## 2018-12-25 MED ORDER — PANTOPRAZOLE SODIUM 40 MG PO PACK: 40.0000 mg | PACK | Freq: Every day | ORAL | Status: DC

## 2018-12-25 MED ORDER — PREDNISONE 5 MG PO TABS: 35.0000 mg | ORAL_TABLET | Freq: Every day | ORAL | Status: DC

## 2018-12-25 MED ORDER — IPRATROPIUM-ALBUTEROL 0.5-2.5 (3) MG/3ML IN SOLN
3.0000 mL | Freq: Two times a day (BID) | RESPIRATORY_TRACT | Status: DC
  Administered 2018-12-25 – 2018-12-27 (×4): 3 mL via RESPIRATORY_TRACT
  Filled 2018-12-25 (×4): qty 3

## 2018-12-25 MED ORDER — PREDNISONE 20 MG PO TABS: 20.0000 mg | ORAL_TABLET | Freq: Every day | ORAL | Status: DC

## 2018-12-25 MED ORDER — ATORVASTATIN CALCIUM 10 MG PO TABS
20.0000 mg | ORAL_TABLET | Freq: Every day | ORAL | Status: DC
  Administered 2018-12-25 – 2018-12-27 (×3): 20 mg via ORAL
  Filled 2018-12-25: qty 1
  Filled 2018-12-25: qty 2
  Filled 2018-12-25: qty 1

## 2018-12-25 MED ORDER — HYDROCORTISONE 5 MG PO TABS
15.0000 mg | ORAL_TABLET | Freq: Every morning | ORAL | Status: DC
  Administered 2018-12-26 – 2018-12-28 (×3): 15 mg via ORAL
  Filled 2018-12-25 (×3): qty 1

## 2018-12-25 MED ORDER — PREDNISONE 20 MG PO TABS: 30.0000 mg | ORAL_TABLET | Freq: Every day | ORAL | Status: DC

## 2018-12-25 MED ORDER — MAGNESIUM SULFATE IN D5W 1-5 GM/100ML-% IV SOLN
1.0000 g | Freq: Once | INTRAVENOUS | Status: AC
  Administered 2018-12-25: 1 g via INTRAVENOUS
  Filled 2018-12-25: qty 100

## 2018-12-25 MED ORDER — POTASSIUM PHOSPHATES 15 MMOLE/5ML IV SOLN
10.0000 meq | Freq: Once | INTRAVENOUS | Status: AC
  Administered 2018-12-25: 06:00:00 10 meq via INTRAVENOUS
  Filled 2018-12-25: qty 2.27

## 2018-12-25 MED ORDER — PREDNISONE 5 MG PO TABS: 25.0000 mg | ORAL_TABLET | Freq: Every day | ORAL | Status: DC

## 2018-12-25 MED ORDER — HYDROCORTISONE 5 MG PO TABS
5.0000 mg | ORAL_TABLET | Freq: Every day | ORAL | Status: DC
  Administered 2018-12-26 – 2018-12-27 (×2): 5 mg via ORAL
  Filled 2018-12-25 (×3): qty 1

## 2018-12-25 MED ORDER — DULOXETINE HCL 60 MG PO CPEP
120.0000 mg | ORAL_CAPSULE | Freq: Every day | ORAL | Status: DC
  Administered 2018-12-25 – 2018-12-28 (×4): 120 mg via ORAL
  Filled 2018-12-25 (×4): qty 2

## 2018-12-25 MED ORDER — IPRATROPIUM-ALBUTEROL 0.5-2.5 (3) MG/3ML IN SOLN
3.0000 mL | RESPIRATORY_TRACT | Status: DC | PRN
  Administered 2018-12-26: 12:00:00 3 mL via RESPIRATORY_TRACT
  Filled 2018-12-25: qty 3

## 2018-12-25 MED ORDER — PREDNISONE 5 MG PO TABS: 5.0000 mg | ORAL_TABLET | Freq: Every day | ORAL | Status: DC

## 2018-12-25 MED ORDER — INSULIN ASPART 100 UNIT/ML ~~LOC~~ SOLN
0.0000 [IU] | Freq: Three times a day (TID) | SUBCUTANEOUS | Status: DC
  Administered 2018-12-26: 3 [IU] via SUBCUTANEOUS
  Administered 2018-12-26 – 2018-12-28 (×5): 4 [IU] via SUBCUTANEOUS

## 2018-12-25 MED ORDER — INSULIN ASPART 100 UNIT/ML ~~LOC~~ SOLN
0.0000 [IU] | Freq: Every day | SUBCUTANEOUS | Status: DC
  Administered 2018-12-25: 23:00:00 3 [IU] via SUBCUTANEOUS

## 2018-12-25 MED ORDER — PREDNISONE 5 MG PO TABS: 15.0000 mg | ORAL_TABLET | Freq: Every day | ORAL | Status: DC

## 2018-12-25 MED ORDER — HYDROXYZINE HCL 25 MG PO TABS
25.0000 mg | ORAL_TABLET | Freq: Two times a day (BID) | ORAL | Status: DC | PRN
  Administered 2018-12-25 – 2018-12-26 (×3): 25 mg via ORAL
  Filled 2018-12-25 (×3): qty 1

## 2018-12-25 MED ORDER — PRAMIPEXOLE DIHYDROCHLORIDE 0.25 MG PO TABS
1.0000 mg | ORAL_TABLET | Freq: Two times a day (BID) | ORAL | Status: DC
  Administered 2018-12-25 – 2018-12-28 (×7): 1 mg via ORAL
  Filled 2018-12-25: qty 1
  Filled 2018-12-25: qty 4
  Filled 2018-12-25 (×2): qty 1
  Filled 2018-12-25: qty 4
  Filled 2018-12-25: qty 1
  Filled 2018-12-25 (×2): qty 4

## 2018-12-25 MED ORDER — INSULIN ASPART 100 UNIT/ML ~~LOC~~ SOLN: 0.0000 [IU] | Freq: Three times a day (TID) | SUBCUTANEOUS | Status: DC

## 2018-12-25 MED ORDER — POTASSIUM CHLORIDE 20 MEQ/15ML (10%) PO SOLN
40.0000 meq | Freq: Once | ORAL | Status: AC
  Administered 2018-12-25: 05:00:00 40 meq
  Filled 2018-12-25: qty 30

## 2018-12-25 MED ORDER — LEVOTHYROXINE SODIUM 75 MCG PO TABS
150.0000 ug | ORAL_TABLET | Freq: Every day | ORAL | Status: DC
  Administered 2018-12-26 – 2018-12-28 (×3): 150 ug via ORAL
  Filled 2018-12-25 (×2): qty 1
  Filled 2018-12-25 (×2): qty 2

## 2018-12-25 NOTE — Evaluation (Signed)
Clinical/Bedside Swallow Evaluation Patient Details  Name: Crystal Yates MRN: [id number] Date of Birth: Feb 27, 1956  Today's Date: 12/25/2018 Time: SLP Start Time (ACUTE ONLY): 1510 SLP Stop Time (ACUTE ONLY): 1518 SLP Time Calculation (min) (ACUTE ONLY): 8 min  HPI:  [name] is a 63 yo F w/ PMH of DM, COPD, Sjogrens, HFrEF, CAD, tobacco use, and obesity who presented with acute on chronic combined hypoxic and hypercapnic respiratory failure requiring intubation secondary to COPD and CHF exacerbation admit to ICU. Intubated from 9/28-9/29.    Assessment / Plan / Recommendation Clinical Impression  Pt demonstrates normal swallow function following brief intubation. Passed 3 oz water swallow test. Pt does report she prefers to take meds whole in puree.  SLP Visit Diagnosis: Dysphagia, unspecified (R13.10)    Aspiration Risk  Mild aspiration risk    Diet Recommendation Regular;Thin liquid   Liquid Administration via: Cup;Straw Supervision: Intermittent supervision to cue for compensatory strategies;Patient able to self feed    Other  Recommendations     Follow up Recommendations   none        Swallow Study   General HPI: [name] is a 63 yo F w/ PMH of DM, COPD, Sjogrens, HFrEF, CAD, tobacco use, and obesity who presented with acute on chronic combined hypoxic and hypercapnic respiratory failure requiring intubation secondary to COPD and CHF exacerbation admit to ICU. Intubated from 9/28-9/29.  Type of Study: Bedside Swallow Evaluation Previous Swallow Assessment: none Diet Prior to this Study: NPO Temperature Spikes Noted: No Respiratory Status: Nasal cannula History of Recent Intubation: Yes Length of Intubations (days): 2 days Date extubated: 12/25/18 Behavior/Cognition: Alert;Cooperative;Pleasant mood Oral Cavity Assessment: Within Functional Limits Oral Care Completed by SLP: No Oral Cavity - Dentition: Dentures, not  available;Edentulous Vision: Functional for self-feeding Self-Feeding Abilities: Able to feed self Patient Positioning: Upright in bed Baseline Vocal Quality: Hoarse Volitional Cough: Strong Volitional Swallow: Able to elicit    Oral/Motor/Sensory Function Overall Oral Motor/Sensory Function: Within functional limits   Ice Chips Ice chips: Not tested   Thin Liquid Thin Liquid: Within functional limits Presentation: Cup;Straw    Nectar Thick Nectar Thick Liquid: Not tested   Honey Thick Honey Thick Liquid: Not tested   Puree Puree: Within functional limits Presentation: Spoon;Self Fed   Solid     Solid: Within functional limits     Herbie Baltimore, MA CCC-SLP  Acute Rehabilitation Services Pager [phone] Office [phone]  Othelia Pulling, Katherene Ponto 12/25/2018,3:31 PM

## 2018-12-25 NOTE — Progress Notes (Signed)
Pt had a 21 beat run of V-tach. Pt does not complain of any chest pain or discomfort. VS check. Elink made aware and morning labs drawn STAT. Will continue to assess.

## 2018-12-25 NOTE — Progress Notes (Signed)
eLink Physician-Brief Progress Note Patient Name: Crystal Yates DOB: 1955/10/17 MRN: [id number]   Date of Service  12/25/2018  HPI/Events of Note  Labs drawn earlier due to episode of NSVT. K 3.6, Mg 1.9 and Phos 2 Patient receiving diuretics  eICU Interventions  Ordered K 40 meqs x 1, Magnesium 1 gram IV, Kphos 10 mmol     Intervention Category Major Interventions: Electrolyte abnormality - evaluation and management;Arrhythmia - evaluation and management  Judd Lien 12/25/2018, 4:21 AM

## 2018-12-25 NOTE — Procedures (Signed)
Extubation Procedure Note  Patient Details:   Name: Crystal Yates DOB: Feb 13, 1956 MRN: [id number]   Airway Documentation:    Vent end date: 12/25/18 Vent end time: 1220   Evaluation  O2 sats: stable throughout Complications: No apparent complications Patient did tolerate procedure well. Bilateral Breath Sounds: Clear, Diminished   Yes   RT extubated patient to 3L [place] per MD order with RN at bedside. Positive cuff leak noted. Patient tolerated well and can speak. No stridor noted. RT will continue to monitor as needed.   Vernona Rieger 12/25/2018, 12:28 PM

## 2018-12-25 NOTE — Progress Notes (Addendum)
NAME:  Crystal Yates, MRN:  [id number], DOB:  [birth year], LOS: 1 ADMISSION DATE:  12/24/2018, CONSULTATION DATE:  12/24/18 REFERRING MD:  Shanda Bumps, CHIEF COMPLAINT:  Shortness of breath   Brief History   Crystal Yates is a 63 yo F w/ PMH of DM, COPD, Sjogrens, HFrEF, CAD, tobacco use, and obesity who presented with acute on chronic combined hypoxic and hypercapnic respiratory failure requiring intubation secondary to COPD and CHF exacerbation admit to ICU.  Past Medical History  DM Sjogrens COPD on 2L at home Combined systolic/diastolic heart failure CAD s/p stents OHS OSA Tobacco use  Significant Hospital Events   9/28 Admission  Consults:  N/A  Procedures:  9/28 ETT>>  Significant Diagnostic Tests:  9/28 COVID negative 9/28 CT head negative  Micro Data:  9/25 Sputum culture>> 9/28 blood culture>>  Antimicrobials:  9/25 Vancomycin 9/25 Zosyn  Interim history/subjective:  Overnight events: 21 beats of non-sustained V-tach. Mag,K repleted  [name] was examined and evaluated at bedside this AM. She was observed alert, following directions and answering questions yes and no. She expresses that she is not in pain but does feel anxious. Denies any chest pain or shortness of breath.  Objective   Blood pressure (!) 112/52, pulse 90, temperature 97.8 F (36.6 C), temperature source Oral, resp. rate (!) 26, height 5\' 2"  (1.575 m), weight 109.3 kg, SpO2 97 %.    Vent Mode: PRVC FiO2 (%):  [40 %-60 %] 40 % Set Rate:  [28 bmp] 28 bmp Vt Set:  [320 mL-430 mL] 430 mL PEEP:  [8 cmH20] 8 cmH20 Plateau Pressure:  [19 cmH20-27 cmH20] 23 cmH20   Intake/Output Summary (Last 24 hours) at 12/25/2018 0714 Last data filed at 12/25/2018 0600 Gross per 24 hour  Intake 816.73 ml  Output 1461 ml  Net -644.27 ml   Filed Weights   12/24/18 0421 12/24/18 1100 12/25/18 0241  Weight: 102.1 kg 109.1 kg 109.3 kg   Examination: Gen: Well-developed, obese,  NAD HEENT: EOMI, MMM, ET tube in place and functioning Neck: supple, ROM intact, no JVD CV: RRR, S1, S2 normal, No rubs, no murmurs, no gallops Pulm: Distant breath sounds, No rales, no wheezes Abd: Soft, BS+, NTND Extm: ROM intact, Peripheral pulses intact, 1+ peripheral edema Skin: Dry, Warm, normal turgor Neuro: Alert and oriented, follows commands   Resolved Hospital Problem list   N/A  Assessment & Plan:  Acute on chronic hypercapnic, hypoxic respiratory failure COPD exacerbation Chest X-ray w/ evidence of hyperinflated lungs with flattened diaphragm. No significant pulm edema. Abg w/ significant hypercarbic resp failure: pH 7.151, pCO2 77.9 now improved to pH of 7.43, pCO2 of 39.5 Chart review shows multiple recent admissions for acute resp failure w/ similar presentation. Last spirometry w/ FEV/FVC 67%, FEV 46% consistent w/ GOLD D COPD w/ grade 3 obstruction. - Breathing trials, likely extubation today - Wean FiO2, PEEP down, keep O2 sat >90 - VAP protocol - Duoneb q4hr - D/c prednisone - C/w pulmicort, brovana - Trend ABGs - Will transition to Bipap when appropriate  Hypotension Acute on chronic systolic heart failure exacerbation Started on vanc/zosyn in ED due to hypotension which has now resolved. TTE 10/2018: EF 35-40%. On lasix 40mg  at home. Lower extremity w/ pitting edema. 1.5L output overnight.  - Afebrile for over 24 hours, D/c antibiotics - Will hold furosemide for now due to AKI. - Trend BMP - Mag level - Strict I&Os - Daily Weights - Fluid restriction - Keep O2 sat >88 - Replenish  K as needed >4.0 - Holding home oral meds while intubated  DM Hgb a1c on admissio 9.0, Receiving steroids for multiple COPD exacerbation. - SSI - Glucose checks  AKI on CKD Admit creatinine 1.25->1.56. Baseline creatine ~1.1. Likely due to combination of vanc/zosyn + furosemide - Trend renal fx - Avoid nephrotoxic meds when appropriate  Hypothyroidism On  levothyroxine 155mcg at home - C/w home meds  Ongoing tobacco dependence -Encourage cessation.  -Patient still smoking about half a pack per day -Continue to counsel patient when extubated  Best practice:  Diet: NPO Pain/Anxiety/Delirium protocol (if indicated): Fentanyl VAP protocol (if indicated): HOB elevated DVT prophylaxis: subqheparin GI prophylaxis: PPI Glucose control: SSI Mobility: As tolerated Code Status: Full Family Communication: update husband at bedside Disposition: ICU to progressive tomorrow  Labs   CBC: Recent Labs  Lab 12/24/18 0418 12/24/18 0608 12/24/18 0712 12/24/18 0750 12/24/18 2005 12/25/18 0245  WBC 12.1*  --   --  16.4*  --  13.2*  NEUTROABS 7.2  --   --   --   --   --   HGB 13.0 12.9 13.6 12.9 12.2 12.1  HCT 42.4 38.0 40.0 40.4 36.0 36.7  MCV 97.9  --   --  98.1  --  92.4  PLT 248  --   --  249  --  [id number]    Basic Metabolic Panel: Recent Labs  Lab 12/24/18 0418 12/24/18 0608 12/24/18 0712 12/24/18 2005 12/25/18 0245  NA 139 141 141 142 142  K 4.0 4.0 4.1 3.7 3.6  CL 107  --   --   --  109  CO2 23  --   --   --  24  GLUCOSE 296*  --   --   --  164*  BUN 23  --   --   --  30*  CREATININE 1.25*  --   --   --  1.56*  CALCIUM 9.1  --   --   --  8.8*  MG  --   --   --   --  1.9  PHOS  --   --   --   --  2.0*   GFR: Estimated Creatinine Clearance: 43 mL/min (A) (by C-G formula based on SCr of 1.56 mg/dL (H)). Recent Labs  Lab 12/24/18 0418 12/24/18 0750 12/25/18 0245  WBC 12.1* 16.4* 13.2*    Liver Function Tests: Recent Labs  Lab 12/24/18 0418  AST 30  ALT 23  ALKPHOS 75  BILITOT 0.4  PROT 6.2*  ALBUMIN 2.8*   No results for input(s): LIPASE, AMYLASE in the last 168 hours. No results for input(s): AMMONIA in the last 168 hours.  ABG    Component Value Date/Time   PHART 7.430 12/25/2018 0320   PCO2ART 39.5 12/25/2018 0320   PO2ART 84.7 12/25/2018 0320   HCO3 25.8 12/25/2018 0320   TCO2 25 12/24/2018 2005    ACIDBASEDEF 2.0 12/24/2018 2005   O2SAT 96.8 12/25/2018 0320     Coagulation Profile: No results for input(s): INR, PROTIME in the last 168 hours.  Cardiac Enzymes: No results for input(s): CKTOTAL, CKMB, CKMBINDEX, TROPONINI in the last 168 hours.  HbA1C: Hgb A1c MFr Bld  Date/Time Value Ref Range Status  12/24/2018 07:50 AM 9.0 (H) 4.8 - 5.6 % Final    Comment:    (NOTE) Pre diabetes:          5.7%-6.4% Diabetes:              >  6.4% Glycemic control for   <7.0% adults with diabetes     CBG: Recent Labs  Lab 12/24/18 1117 12/24/18 1519 12/24/18 1936 12/25/18 0005 12/25/18 0326  GLUCAP 320* 339* 258* 204* 132*    Review of Systems:   Unable to assess due to intubation  Past Medical History  She,  has no past medical history on file.   Surgical History      Social History     Current 1/2 pack smoker  Family History   Her family history is not on file.   Allergies Allergies  Allergen Reactions  . Buprenorphine   . Hydroxychloroquine   . Prednisone   . Tape   . Varenicline      Home Medications  Prior to Admission medications   Medication Sig Start Date End Date Taking? Authorizing Provider  albuterol (PROVENTIL) (2.5 MG/3ML) 0.083% nebulizer solution Inhale 3 mLs into the lungs every 6 (six) hours as needed for wheezing or shortness of breath. 07/07/18   [provider]  ASPIRIN LOW DOSE 81 MG EC tablet Take 81 mg by mouth daily. 08/09/18   [provider]  atorvastatin (LIPITOR) 20 MG tablet Take 20 mg by mouth daily at 6 PM. 09/20/18   [provider]  clopidogrel (PLAVIX) 75 MG tablet Take 75 mg by mouth daily. 09/20/18   [provider]  DULoxetine (CYMBALTA) 60 MG capsule Take 120 mg by mouth daily. 09/20/18   [provider]  EMGALITY 120 MG/ML SOAJ Inject 120 mg into the skin every 30 (thirty) days. 12/16/18   [provider]  ENTRESTO 24-26 MG Take 1 tablet by mouth 2 (two) times daily. 12/21/18    [provider]  furosemide (LASIX) 40 MG tablet  10/04/18   [provider]  hydrOXYzine (ATARAX/VISTARIL) 25 MG tablet Take 25 mg by mouth 2 (two) times daily as needed for anxiety or itching. 07/23/18   [provider]  ipratropium-albuterol (DUONEB) 0.5-2.5 (3) MG/3ML SOLN VVN  QID 11/14/18   [provider]  Iron-FA-B Cmp-C-Biot-Probiotic (FUSION PLUS) CAPS Take 1 capsule by mouth daily. 12/09/18   [provider]  LORazepam (ATIVAN) 0.5 MG tablet Take 0.5 mg by mouth daily as needed for anxiety. 11/26/18   [provider]  MAGNESIUM-OXIDE 400 (241.3 Mg) MG tablet Take 400 mg by mouth daily. 12/21/18   [provider]  metolazone (ZAROXOLYN) 2.5 MG tablet  11/13/18   [provider]  nitroGLYCERIN (NITROSTAT) 0.4 MG SL tablet Place 1 tablet under the tongue every 5 (five) minutes x 3 doses as needed for chest pain. 09/20/18   [provider]  oxyCODONE (OXY IR/ROXICODONE) 5 MG immediate release tablet Take 5 mg by mouth. 12/05/18   [provider]  potassium chloride SA (K-DUR) 20 MEQ tablet Take 20 mEq by mouth 2 (two) times daily. 10/04/18   [provider]  pramipexole (MIRAPEX) 1 MG tablet Take 1 mg by mouth 2 (two) times daily. 12/10/18   [provider]  STIOLTO RESPIMAT 2.5-2.5 MCG/ACT AERS Inhale 2 puffs into the lungs daily. 09/20/18   [provider]  SYNTHROID 150 MCG tablet Take 150 mcg by mouth daily before breakfast. 09/21/18   [provider]  tiZANidine (ZANAFLEX) 4 MG tablet Take 4 mg by mouth 2 (two) times daily as needed for muscle spasms. 11/12/18   [provider]  torsemide (DEMADEX) 20 MG tablet  07/22/18   [provider]  Donnal Debar 100-62.5-25 MCG/INH AEPB  Inhale 1 puff into the lungs daily. 11/14/18   [provider]  TRESIBA FLEXTOUCH 100 UNIT/ML SOPN FlexTouch Pen Inject 40 Units into the skin daily. 12/04/18   [provider]  TRULICITY 1.5 0000000 SOPN Inject 1.5 mg into the skin every 7 (seven) days. 11/07/18   [provider]  zaleplon (SONATA) 10 MG capsule  08/22/18   [provider]  zolpidem Lorrin Mais) 5 MG tablet  11/13/18   [provider]     Critical care time:     Gilberto Better, MD PGY-2, [place] [facility] Pager: [phone]  Attending attestation to follow

## 2018-12-26 DIAGNOSIS — J9601 Acute respiratory failure with hypoxia: Secondary | ICD-10-CM | POA: Diagnosis not present

## 2018-12-26 DIAGNOSIS — J449 Chronic obstructive pulmonary disease, unspecified: Secondary | ICD-10-CM | POA: Diagnosis not present

## 2018-12-26 LAB — PHOSPHORUS: Phosphorus: 3.5 mg/dL (ref 2.5–4.6)

## 2018-12-26 LAB — BASIC METABOLIC PANEL
Anion gap: 11 (ref 5–15)
BUN: 36 mg/dL — ABNORMAL HIGH (ref 8–23)
CO2: 25 mmol/L (ref 22–32)
Calcium: 8.6 mg/dL — ABNORMAL LOW (ref 8.9–10.3)
Chloride: 103 mmol/L (ref 98–111)
Creatinine, Ser: 1.49 mg/dL — ABNORMAL HIGH (ref 0.44–1.00)
GFR calc Af Amer: 43 mL/min — ABNORMAL LOW (ref >60)
GFR calc non Af Amer: 37 mL/min — ABNORMAL LOW (ref >60)
Glucose, Bld: 193 mg/dL — ABNORMAL HIGH (ref 70–99)
Potassium: 4.5 mmol/L (ref 3.5–5.1)
Sodium: 139 mmol/L (ref 135–145)

## 2018-12-26 LAB — CBC
HCT: 34.8 % — ABNORMAL LOW (ref 36.0–46.0)
Hemoglobin: 11 g/dL — ABNORMAL LOW (ref 12.0–15.0)
MCH: 29.9 pg (ref 26.0–34.0)
MCHC: 31.6 g/dL (ref 30.0–36.0)
MCV: 94.6 fL (ref 80.0–100.0)
Platelets: 203 10*3/uL (ref 150–400)
RBC: 3.68 MIL/uL — ABNORMAL LOW (ref 3.87–5.11)
RDW: 15.8 % — ABNORMAL HIGH (ref 11.5–15.5)
WBC: 17.7 10*3/uL — ABNORMAL HIGH (ref 4.0–10.5)
nRBC: 0 % (ref 0.0–0.2)

## 2018-12-26 LAB — GLUCOSE, CAPILLARY
Glucose-Capillary: 139 mg/dL — ABNORMAL HIGH (ref 70–99)
Glucose-Capillary: 180 mg/dL — ABNORMAL HIGH (ref 70–99)
Glucose-Capillary: 194 mg/dL — ABNORMAL HIGH (ref 70–99)
Glucose-Capillary: 148 mg/dL — ABNORMAL HIGH (ref 70–99)
Glucose-Capillary: 150 mg/dL — ABNORMAL HIGH (ref 70–99)

## 2018-12-26 LAB — MAGNESIUM: Magnesium: 2.1 mg/dL (ref 1.7–2.4)

## 2018-12-26 MED ORDER — NON FORMULARY: 3.0000 mg | Freq: Every evening | Status: DC | PRN

## 2018-12-26 MED ORDER — MELATONIN 3 MG PO TABS
3.0000 mg | ORAL_TABLET | Freq: Every evening | ORAL | Status: DC | PRN
  Filled 2018-12-26: qty 1

## 2018-12-26 NOTE — Evaluation (Signed)
Physical Therapy Evaluation Patient Details Name: Crystal Yates MRN: [id number] DOB: 1955/04/13 Today's Date: 12/26/2018   History of Present Illness  Pt adm with respiratory failure due to copd and chf exacerbation. Pt intubated 9/28-9/29. PMH -  CAD, CHF, DM, HTN, obesity, COPD, CKD and sx hx of lapy for SBO.  Clinical Impression  Pt admitted with above diagnosis and presents to PT with functional limitations due to deficits listed below (See PT problem list). Pt needs skilled PT to maximize independence and safety to allow discharge to home with husband. May progress quickly and not need HH at DC.     Follow Up Recommendations Home health PT (May not need if progresses quickly)    Equipment Recommendations  None recommended by PT    Recommendations for Other Services       Precautions / Restrictions Precautions Precautions: Fall Restrictions Weight Bearing Restrictions: No      Mobility  Bed Mobility Overal bed mobility: Needs Assistance Bed Mobility: Supine to Sit     Supine to sit: Min guard;HOB elevated     General bed mobility comments: Assist for lines and safety  Transfers Overall transfer level: Needs assistance Equipment used: 4-wheeled walker Transfers: Sit to/from Stand Sit to Stand: Min guard;+2 safety/equipment         General transfer comment: Assist for safety. Verbal cues for hand placement  Ambulation/Gait Ambulation/Gait assistance: Min guard;+2 physical assistance Gait Distance (Feet): 20 Feet Assistive device: 4-wheeled walker Gait Pattern/deviations: Step-through pattern;Decreased stride length;Trunk flexed;Wide base of support Gait velocity: decr Gait velocity interpretation: <1.31 ft/sec, indicative of household ambulator General Gait Details: Assist for safety and lines. Pt amb on 2L with SpO2 to 85%. Incr O2 to 3L with rebound of SpO2 to 90%.   Stairs            Wheelchair Mobility    Modified Rankin (Stroke  Patients Only)       Balance Overall balance assessment: Needs assistance Sitting-balance support: No upper extremity supported;Feet supported Sitting balance-Leahy Scale: Good     Standing balance support: No upper extremity supported;During functional activity Standing balance-Leahy Scale: Fair                               Pertinent Vitals/Pain Pain Assessment: No/denies pain    Home Living Family/patient expects to be discharged to:: Private residence Living Arrangements: Spouse/significant other Available Help at Discharge: Family Type of Home: House Home Access: Stairs to enter Entrance Stairs-Rails: Left Entrance Stairs-Number of Steps: 4 Home Layout: One level Home Equipment: Adaptive equipment;Walker - 4 wheels;Grab bars - toilet;Grab bars - tub/shower;Shower seat      Prior Function Level of Independence: Independent with assistive device(s)         Comments: Uses rollator, home O2 and adaptive, equipment     Hand Dominance   Dominant Hand: Left    Extremity/Trunk Assessment   Upper Extremity Assessment Upper Extremity Assessment: Defer to OT evaluation    Lower Extremity Assessment Lower Extremity Assessment: Generalized weakness       Communication   Communication: No difficulties  Cognition Arousal/Alertness: Awake/alert Behavior During Therapy: Anxious Overall Cognitive Status: Within Functional Limits for tasks assessed                                        General Comments  Exercises     Assessment/Plan    PT Assessment Patient needs continued PT services  PT Problem List Decreased strength;Decreased activity tolerance;Decreased balance;Decreased mobility;Cardiopulmonary status limiting activity;Obesity       PT Treatment Interventions DME instruction;Gait training;Functional mobility training;Stair training;Therapeutic activities;Therapeutic exercise;Balance training;Patient/family education     PT Goals (Current goals can be found in the Care Plan section)  Acute Rehab PT Goals Patient Stated Goal: return home PT Goal Formulation: With patient Time For Goal Achievement: 01/02/19 Potential to Achieve Goals: Good    Frequency Min 3X/week   Barriers to discharge Inaccessible home environment stairs to enter    Co-evaluation PT/OT/SLP Co-Evaluation/Treatment: Yes Reason for Co-Treatment: For patient/therapist safety PT goals addressed during session: Mobility/safety with mobility         AM-PAC PT "6 Clicks" Mobility  Outcome Measure Help needed turning from your back to your side while in a flat bed without using bedrails?: A Little Help needed moving from lying on your back to sitting on the side of a flat bed without using bedrails?: A Little Help needed moving to and from a bed to a chair (including a wheelchair)?: A Little Help needed standing up from a chair using your arms (e.g., wheelchair or bedside chair)?: A Little Help needed to walk in hospital room?: A Little Help needed climbing 3-5 steps with a railing? : A Little 6 Click Score: 18    End of Session Equipment Utilized During Treatment: Oxygen Activity Tolerance: Patient limited by fatigue Patient left: in chair;with call bell/phone within reach;with family/visitor present   PT Visit Diagnosis: Unsteadiness on feet (R26.81);Other abnormalities of gait and mobility (R26.89);Muscle weakness (generalized) (M62.81)    Time: 1115-1140 PT Time Calculation (min) (ACUTE ONLY): 25 min   Charges:   PT Evaluation $PT Eval Moderate Complexity: [place] Pager [phone] Office [place] 12/26/2018, 12:36 PM

## 2018-12-26 NOTE — Progress Notes (Signed)
eLink Physician-Brief Progress Note Patient Name: Crystal Yates DOB: 02-06-1956 MRN: [id number]   Date of Service  12/26/2018  HPI/Events of Note  Pt with acute on chronic hypercapnic respiratory failure recently extubated. Pt also is  Obese and likely has OSA. She is requesting Ambien for sleep which she was on at home but has not taken since hospitalization. On Video camera in she already seems quite drowsy.  eICU Interventions  Will withhold Ambien at this time, but will place an order for Melatonin 3 mg po HS prn insomnia.        Kerry Kass Ogan 12/26/2018, 8:23 PM

## 2018-12-26 NOTE — Evaluation (Signed)
Occupational Therapy Evaluation Patient Details Name: Crystal Yates MRN: [id number] DOB: 1955/03/30 Today's Date: 12/26/2018    History of Present Illness Pt adm with respiratory failure due to copd and chf exacerbation. Pt intubated 9/28-9/29. PMH -  CAD, CHF, DM, HTN, obesity, COPD, CKD and sx hx of lapy for SBO.   Clinical Impression   This 63 y/o female presents with the above. PTA pt reports mod independent with ADL and functional mobility using rollator and AE for ADL tasks. Pt completing functional mobility using rollator this session and overall minguard-minA. She currently requires modA for LB (without AE) and toileting ADL; setup/minguard assist for seated UB ADL. Pt easily fatigues and requires seated rest breaks; requiring 3L O2 today to maintain SpO2 86-90%. She will benefit from continued acute OT services and recommend follow up [facility] services after discharge maximize her safety and independence with ADL and mobility. Will follow.     Follow Up Recommendations  Home health OT;Supervision/Assistance - 24 hour    Equipment Recommendations  None recommended by OT(pt's DME needs are met)           Precautions / Restrictions Precautions Precautions: Fall Restrictions Weight Bearing Restrictions: No      Mobility Bed Mobility Overal bed mobility: Needs Assistance Bed Mobility: Supine to Sit     Supine to sit: Min guard;HOB elevated     General bed mobility comments: Assist for lines and safety  Transfers Overall transfer level: Needs assistance Equipment used: 4-wheeled walker Transfers: Sit to/from Stand Sit to Stand: Min guard;+2 safety/equipment         General transfer comment: Assist for safety. Verbal cues for hand placement    Balance Overall balance assessment: Needs assistance Sitting-balance support: No upper extremity supported;Feet supported Sitting balance-Leahy Scale: Good     Standing balance support: No upper extremity  supported;During functional activity Standing balance-Leahy Scale: Fair                             ADL either performed or assessed with clinical judgement   ADL Overall ADL's : Needs assistance/impaired Eating/Feeding: Modified independent;Sitting   Grooming: Set up;Sitting   Upper Body Bathing: Min guard;Set up;Sitting   Lower Body Bathing: Minimal assistance;Min guard;Sit to/from stand;With adaptive equipment Lower Body Bathing Details (indicate cue type and reason): lesser assist with AE Upper Body Dressing : Set up;Min guard;Sitting   Lower Body Dressing: Moderate assistance;Sit to/from stand;Min guard Lower Body Dressing Details (indicate cue type and reason): lesser assist with AE, utilized assist from therapist to don socks, briefs today Toilet Transfer: Minimal assistance;Ambulation;Grab Information systems manager Details (indicate cue type and reason): using rollator Toileting- Clothing Manipulation and Hygiene: Moderate assistance;Sit to/from stand;Sitting/lateral lean Toileting - Clothing Manipulation Details (indicate cue type and reason): pt performing some pericare via lateral leans, required assist for peri-care in rear - discussed option of using AE for toileting, will benefit from further education/demo     Functional mobility during ADLs: Minimal assistance(rollator)       Vision         Perception     Praxis      Pertinent Vitals/Pain Pain Assessment: No/denies pain     Hand Dominance Left   Extremity/Trunk Assessment Upper Extremity Assessment Upper Extremity Assessment: Generalized weakness   Lower Extremity Assessment Lower Extremity Assessment: Defer to PT evaluation       Communication Communication Communication: No difficulties   Cognition Arousal/Alertness: Awake/alert Behavior  During Therapy: Anxious Overall Cognitive Status: Within Functional Limits for tasks assessed                                      General Comments  pt requiring 3L O2 with SpO2 86-90s     Exercises     Shoulder Instructions      Home Living Family/patient expects to be discharged to:: Private residence Living Arrangements: Spouse/significant other Available Help at Discharge: Family Type of Home: House Home Access: Stairs to enter Technical brewer of Steps: 4 Entrance Stairs-Rails: Left Home Layout: One level     Bathroom Shower/Tub: Walk-in shower;Tub/shower unit(walk in tub)   Bathroom Toilet: Standard     Home Equipment: Adaptive equipment;Walker - 4 wheels;Grab bars - toilet;Grab bars - tub/shower;Shower Theme park manager: Reacher;Sock aid        Prior Functioning/Environment Level of Independence: Independent with assistive device(s)        Comments: Uses rollator, home O2 and adaptive, equipment        OT Problem List: Decreased strength;Decreased range of motion;Decreased activity tolerance;Impaired balance (sitting and/or standing);Decreased safety awareness;Decreased knowledge of use of DME or AE;Obesity;Cardiopulmonary status limiting activity      OT Treatment/Interventions: Self-care/ADL training;Therapeutic exercise;Neuromuscular education;Energy conservation;DME and/or AE instruction;Therapeutic activities;Patient/family education;Balance training    OT Goals(Current goals can be found in the care plan section) Acute Rehab OT Goals Patient Stated Goal: return home OT Goal Formulation: With patient Time For Goal Achievement: 01/09/19 Potential to Achieve Goals: Good  OT Frequency: Min 2X/week   Barriers to D/C:            Co-evaluation PT/OT/SLP Co-Evaluation/Treatment: Yes Reason for Co-Treatment: For patient/therapist safety PT goals addressed during session: Mobility/safety with mobility OT goals addressed during session: ADL's and self-care      AM-PAC OT "6 Clicks" Daily Activity     Outcome Measure Help from another person eating  meals?: None Help from another person taking care of personal grooming?: A Little Help from another person toileting, which includes using toliet, bedpan, or urinal?: A Lot Help from another person bathing (including washing, rinsing, drying)?: A Lot Help from another person to put on and taking off regular upper body clothing?: None Help from another person to put on and taking off regular lower body clothing?: A Lot 6 Click Score: 17   End of Session Equipment Utilized During Treatment: Gait belt;Rolling walker;Oxygen Nurse Communication: Mobility status  Activity Tolerance: Patient tolerated treatment well Patient left: in chair;with call bell/phone within reach;with family/visitor present  OT Visit Diagnosis: Muscle weakness (generalized) (M62.81);Unsteadiness on feet (R26.81);Other (comment)(decreased activity tolerance)                Time: 8638-1771 OT Time Calculation (min): 26 min Charges:  OT General Charges $OT Visit: 1 Visit OT Evaluation $OT Eval Moderate Complexity: 1 Mod  Crystal Cal, OT [employer] Pager [phone] Office [phone]   Raymondo Band 12/26/2018, 2:32 PM

## 2018-12-26 NOTE — Progress Notes (Signed)
Inpatient Diabetes Program Recommendations  AACE/ADA: New Consensus Statement on Inpatient Glycemic Control (2015)  Target Ranges:  Prepandial:   less than 140 mg/dL      Peak postprandial:   less than 180 mg/dL (1-2 hours)      Critically ill patients:  140 - 180 mg/dL   Lab Results  Component Value Date   GLUCAP 139 (H) 12/26/2018   HGBA1C 9.0 (H) 12/24/2018    Review of Glycemic Control Results for Crystal Yates, Crystal Yates (MRN [id number]) as of 12/26/2018 09:56  Ref. Range 12/25/2018 11:29 12/25/2018 15:17 12/25/2018 19:42 12/25/2018 22:26 12/26/2018 07:18  Glucose-Capillary Latest Ref Range: 70 - 99 mg/dL 123 (H) 125 (H) 285 (H) 254 (H) 139 (H)   Diabetes history: DM 2 Outpatient Diabetes medications: Tresiba 40 units Daily, Trulicity 1.5 mg Weekly Current orders for Inpatient glycemic control:  Novolog 0-20 untis tid + hs  A1c 9% multiple COPD exacerbations  Inpatient Diabetes Program Recommendations:    Glucose trends increase after meal intake. May consider Novolog 2-3 units tid meal coverage if pt consumes at least 50% of meals.  Thanks,  Tama Headings RN, MSN, BC-ADM Inpatient Diabetes Coordinator Team Pager [phone] (8a-5p)

## 2018-12-26 NOTE — Progress Notes (Signed)
ICU Transitions of Care Pharmacist Intervention    Crystal Yates is a 63 y.o. female admitted on 12/24/2018  from home, and has been in the intensive care unit for 1d 22h.  Medical History: Past Medical History:  Diagnosis Date  . COPD (chronic obstructive pulmonary disease) ([place])   . DM (diabetes mellitus) ([place])   . Heart failure ([place])   . On home O2   . Sjogren's syndrome ([place])   . Smoker      Home Medication List:  Medications Prior to Admission  Medication Sig Dispense Refill Last Dose  . albuterol (PROVENTIL) (2.5 MG/3ML) 0.083% nebulizer solution Inhale 3 mLs into the lungs every 6 (six) hours as needed for wheezing or shortness of breath.   UNK  . ASPIRIN LOW DOSE 81 MG EC tablet Take 81 mg by mouth daily.   UNK  . atorvastatin (LIPITOR) 20 MG tablet Take 20 mg by mouth daily at 6 PM.   UNK  . clopidogrel (PLAVIX) 75 MG tablet Take 75 mg by mouth daily.   UNK at [facility]  . DULoxetine (CYMBALTA) 60 MG capsule Take 120 mg by mouth daily.   UNK  . EMGALITY 120 MG/ML SOAJ Inject 120 mg into the skin every 30 (thirty) days.   UNK  . ENTRESTO 24-26 MG Take 1 tablet by mouth 2 (two) times daily.   UNK  . furosemide (LASIX) 40 MG tablet Take 40 mg by mouth daily.    UNK  . hydrOXYzine (ATARAX/VISTARIL) 25 MG tablet Take 25 mg by mouth 2 (two) times daily as needed for anxiety or itching.   UNK  . ipratropium-albuterol (DUONEB) 0.5-2.5 (3) MG/3ML SOLN Take 3 mLs by nebulization every 6 (six) hours as needed (SHORTNESS OF BREATH, WHEEZING).    UNK  . Iron-FA-B Cmp-C-Biot-Probiotic (FUSION PLUS) CAPS Take 1 capsule by mouth daily.   UNK  . LORazepam (ATIVAN) 0.5 MG tablet Take 0.5 mg by mouth daily as needed for anxiety.   UNK  . MAGNESIUM-OXIDE 400 (241.3 Mg) MG tablet Take 400 mg by mouth daily.   UNK  . metolazone (ZAROXOLYN) 2.5 MG tablet    UNK  . nitroGLYCERIN (NITROSTAT) 0.4 MG SL tablet Place 1 tablet under the tongue every 5 (five) minutes x 3 doses as needed for chest  pain.   UNK  . oxyCODONE (OXY IR/ROXICODONE) 5 MG immediate release tablet Take 5 mg by mouth every 8 (eight) hours as needed for severe pain.    UNK  . potassium chloride SA (K-DUR) 20 MEQ tablet Take 20 mEq by mouth 2 (two) times daily.   UNK  . pramipexole (MIRAPEX) 1 MG tablet Take 1 mg by mouth 2 (two) times daily.   UNK  . SYNTHROID 150 MCG tablet Take 150 mcg by mouth daily before breakfast.   UNK  . tiZANidine (ZANAFLEX) 4 MG tablet Take 4 mg by mouth 2 (two) times daily as needed for muscle spasms.   UNK  . TRELEGY ELLIPTA 100-62.5-25 MCG/INH AEPB Inhale 1 puff into the lungs daily.   UNK  . TRESIBA FLEXTOUCH 100 UNIT/ML SOPN FlexTouch Pen Inject 40 Units into the skin daily.   UNK  . TRULICITY 1.5 0000000 SOPN Inject 1.5 mg into the skin every 7 (seven) days.   UNK  . zolpidem (AMBIEN) 5 MG tablet Take 5 mg by mouth at bedtime.    UNK     Has a pharmacist made an intervention on this patient's medication list?  yes, because  the patient stayed in medical-surgical ICU for > 48 hours, is discharging from ICU to another level of care within [facility], and has active orders for medications that should be discontinued upon transition out of ICU.    Transitions of care were discussed with Dr. Gilberto Better and the following interventions were made:  Stress Ulcer Prophylaxis:  A medication for stress ulcer prophylaxis was started during ICU admission and not intended to be continued at discharge. Protonix was discontinued  Steroids:  A steroid was started during ICU admission and not intended to be continued at discharge. Prednisone was discontinued and patient's prior to admission steroid was resumed   December 26, 2018  Berenice Bouton, PharmD PGY1 Pharmacy Resident Office phone: [phone]

## 2018-12-26 NOTE — Progress Notes (Addendum)
NAME:  Crystal Yates, MRN:  [id number], DOB:  1955/12/29, LOS: 2 ADMISSION DATE:  12/24/2018, CONSULTATION DATE:  12/24/18 REFERRING MD:  Shanda Bumps, CHIEF COMPLAINT:  Shortness of breath   Brief History   [name] is a 63 yo F w/ PMH of DM, COPD, Sjogrens, HFrEF, CAD, tobacco use, and obesity who presented with acute on chronic combined hypoxic and hypercapnic respiratory failure requiring intubation secondary to COPD and CHF exacerbation admit to ICU.  Past Medical History  DM Sjogrens COPD on 2L at home Combined systolic/diastolic heart failure CAD s/p stents OHS OSA Tobacco use  Significant Hospital Events   9/28 Admission  Consults:  N/A  Procedures:  9/28 ETT>> 12/25/2018  Significant Diagnostic Tests:  9/28 COVID negative 9/28 CT head negative  Micro Data:  9/25 Sputum culture>> never done 9/28 blood culture>> no growth to date  Antimicrobials:  9/25 Vancomycin off 9/25 Zosyn off  Interim history/subjective:  Currently extubated no acute distress utilizes nocturnal BiPAP.  Ready transfer to the floor  Objective   Blood pressure 126/77, pulse (!) 108, temperature (!) 97.1 F (36.2 C), temperature source Axillary, resp. rate (!) 21, height 5\' 2"  (1.575 m), weight 109.1 kg, SpO2 96 %.    Vent Mode: CPAP;PSV FiO2 (%):  [40 %] 40 % PEEP:  [5 cmH20] 5 cmH20 Pressure Support:  [5 cmH20] 5 cmH20   Intake/Output Summary (Last 24 hours) at 12/26/2018 0836 Last data filed at 12/26/2018 0535 Gross per 24 hour  Intake 251.46 ml  Output 1075 ml  Net -823.54 ml   Filed Weights   12/24/18 1100 12/25/18 0241 12/26/18 0500  Weight: 109.1 kg 109.3 kg 109.1 kg   Examination: General: Morbidly obese female is currently eating HEENT: No JVD or lymphadenopathy is appreciated Neuro: Grossly intact CV: s1s2 sinus tach 102 PULM: Currently on nasal cannula 3 L with O2 sats 99% good excursion no respiratory distress GI: Abdomen is soft obese  positive bowel sounds Extremities: warm/dry, decreased edema, or bruising bilateral lower extremities Skin: no rashes or lesions    Resolved Hospital Problem list   N/A  Assessment & Plan:  Acute on chronic hypercapnic, hypoxic respiratory failure COPD exacerbation Chest X-ray w/ evidence of hyperinflated lungs with flattened diaphragm. No significant pulm edema. Abg w/ significant hypercarbic resp failure: pH 7.151, pCO2 77.9 now improved to pH of 7.43, pCO2 of 39.5 Chart review shows multiple recent admissions for acute resp failure w/ similar presentation. Last spirometry w/ FEV/FVC 67%, FEV 46% consistent w/ GOLD D COPD w/ grade 3 obstruction. Extubated 12/25/2018 Continue nocturnal CPAP for OSA Wean FiO2 down to 2 L nasal cannula as per admission She needs weight loss Bronchial dilators and inhaled steroids Transfer to stepdown unit and to [facility] She is to follow-up with her pulmonologist [facility] Dr. Carren Rang    Hypotension Acute on chronic systolic heart failure exacerbation Started on vanc/zosyn in ED due to hypotension which has now resolved. TTE 10/2018: EF 35-40%. On lasix 40mg  at home.  Intake/Output Summary (Last 24 hours) at 12/26/2018 0843 Last data filed at 12/26/2018 0800 Gross per 24 hour  Intake 501.46 ml  Output 1075 ml  Net -573.54 ml    Negative I&O as tolerated Resume home furosemide in near future Strict INO Monitor electrolyte and replete as needed Daily weight    DM CBG (last 3)  Recent Labs    12/25/18 1942 12/25/18 2226 12/26/18 0718  GLUCAP 285* 254* 139*  Hgb a1c on admissio 9.0, Receiving steroids for multiple COPD exacerbation. Sliding-scale insulin protocol Change diet from regular to low carbohydrate Adjust insulin she is on oral diet.   AKI on CKD Lab Results  Component Value Date   CREATININE 1.49 (H) 12/26/2018   CREATININE 1.56 (H) 12/25/2018   CREATININE 1.25 (H) 12/24/2018    Admit  creatinine 1.25->1.56. Baseline creatine ~1.1. Likely due to combination of vanc/zosyn + furosemide Continue to monitor Avoid nephrotoxins   Hypothyroidism On levothyroxine 130mcg at home Continue Synthroid  Ongoing tobacco dependence Discussion of cessation Discussion concerning stop smoking post extubation   Best practice:  Diet: Low carb Pain/Anxiety/Delirium protocol (if indicated): Off sedation VAP protocol (if indicated): HOB elevated DVT prophylaxis: subqheparin GI prophylaxis: PPI Glucose control: SSI Mobility: As tolerated Code Status: Full Family Communication: 12/26/2018 patient updated at bedside Disposition: 12/26/2018 transferred to stepdown unit and to [facility] service.  Tried hospital services been called and report given as of 12/26/2018 9 AM.  Labs   CBC: Recent Labs  Lab 12/24/18 0418  12/24/18 0712 12/24/18 0750 12/24/18 2005 12/25/18 0245 12/26/18 0305  WBC 12.1*  --   --  16.4*  --  13.2* 17.7*  NEUTROABS 7.2  --   --   --   --   --   --   HGB 13.0   < > 13.6 12.9 12.2 12.1 11.0*  HCT 42.4   < > 40.0 40.4 36.0 36.7 34.8*  MCV 97.9  --   --  98.1  --  92.4 94.6  PLT 248  --   --  249  --  186 203   < > = values in this interval not displayed.    Basic Metabolic Panel: Recent Labs  Lab 12/24/18 0418 12/24/18 0608 12/24/18 0712 12/24/18 2005 12/25/18 0245 12/26/18 0305  NA 139 141 141 142 142 139  K 4.0 4.0 4.1 3.7 3.6 4.5  CL 107  --   --   --  109 103  CO2 23  --   --   --  24 25  GLUCOSE 296*  --   --   --  164* 193*  BUN 23  --   --   --  30* 36*  CREATININE 1.25*  --   --   --  1.56* 1.49*  CALCIUM 9.1  --   --   --  8.8* 8.6*  MG  --   --   --   --  1.9 2.1  PHOS  --   --   --   --  2.0* 3.5   GFR: Estimated Creatinine Clearance: 45 mL/min (A) (by C-G formula based on SCr of 1.49 mg/dL (H)). Recent Labs  Lab 12/24/18 0418 12/24/18 0750 12/25/18 0245 12/26/18 0305  WBC 12.1* 16.4* 13.2* 17.7*    Liver Function  Tests: Recent Labs  Lab 12/24/18 0418  AST 30  ALT 23  ALKPHOS 75  BILITOT 0.4  PROT 6.2*  ALBUMIN 2.8*   No results for input(s): LIPASE, AMYLASE in the last 168 hours. No results for input(s): AMMONIA in the last 168 hours.  ABG    Component Value Date/Time   PHART 7.430 12/25/2018 0320   PCO2ART 39.5 12/25/2018 0320   PO2ART 84.7 12/25/2018 0320   HCO3 25.8 12/25/2018 0320   TCO2 25 12/24/2018 2005   ACIDBASEDEF 2.0 12/24/2018 2005   O2SAT 96.8 12/25/2018 0320     Coagulation Profile: No results for input(s): INR, PROTIME  in the last 168 hours.  Cardiac Enzymes: No results for input(s): CKTOTAL, CKMB, CKMBINDEX, TROPONINI in the last 168 hours.  HbA1C: Hgb A1c MFr Bld  Date/Time Value Ref Range Status  12/24/2018 07:50 AM 9.0 (H) 4.8 - 5.6 % Final    Comment:    (NOTE) Pre diabetes:          5.7%-6.4% Diabetes:              >6.4% Glycemic control for   <7.0% adults with diabetes     CBG: Recent Labs  Lab 12/25/18 1129 12/25/18 1517 12/25/18 1942 12/25/18 2226 12/26/18 0718  GLUCAP 123* 125* 285* 254* 139*     Critical care time: App 31 min    Richardson Landry  ACNP [facility] PCCM Pager [phone] till 1 pm If no answer page [phone] 12/26/2018, 8:36 AM

## 2018-12-27 ENCOUNTER — Inpatient Hospital Stay (HOSPITAL_COMMUNITY): Payer: Federal, State, Local not specified - PPO

## 2018-12-27 DIAGNOSIS — E662 Morbid (severe) obesity with alveolar hypoventilation: Secondary | ICD-10-CM

## 2018-12-27 DIAGNOSIS — J9622 Acute and chronic respiratory failure with hypercapnia: Secondary | ICD-10-CM | POA: Diagnosis not present

## 2018-12-27 DIAGNOSIS — J9621 Acute and chronic respiratory failure with hypoxia: Secondary | ICD-10-CM | POA: Diagnosis not present

## 2018-12-27 DIAGNOSIS — R0602 Shortness of breath: Secondary | ICD-10-CM | POA: Diagnosis not present

## 2018-12-27 DIAGNOSIS — J441 Chronic obstructive pulmonary disease with (acute) exacerbation: Secondary | ICD-10-CM | POA: Diagnosis not present

## 2018-12-27 DIAGNOSIS — G4733 Obstructive sleep apnea (adult) (pediatric): Secondary | ICD-10-CM

## 2018-12-27 DIAGNOSIS — I5023 Acute on chronic systolic (congestive) heart failure: Secondary | ICD-10-CM

## 2018-12-27 LAB — CBC WITH DIFFERENTIAL/PLATELET
Abs Immature Granulocytes: 0.13 10*3/uL — ABNORMAL HIGH (ref 0.00–0.07)
Basophils Absolute: 0 10*3/uL (ref 0.0–0.1)
Basophils Relative: 0 %
Eosinophils Absolute: 0.1 10*3/uL (ref 0.0–0.5)
Eosinophils Relative: 1 %
HCT: 34.6 % — ABNORMAL LOW (ref 36.0–46.0)
Hemoglobin: 11.1 g/dL — ABNORMAL LOW (ref 12.0–15.0)
Immature Granulocytes: 1 %
Lymphocytes Relative: 11 %
Lymphs Abs: 1.3 10*3/uL (ref 0.7–4.0)
MCH: 30.3 pg (ref 26.0–34.0)
MCHC: 32.1 g/dL (ref 30.0–36.0)
MCV: 94.5 fL (ref 80.0–100.0)
Monocytes Absolute: 0.8 10*3/uL (ref 0.1–1.0)
Monocytes Relative: 7 %
Neutro Abs: 9.5 10*3/uL — ABNORMAL HIGH (ref 1.7–7.7)
Neutrophils Relative %: 80 %
Platelets: 195 10*3/uL (ref 150–400)
RBC: 3.66 MIL/uL — ABNORMAL LOW (ref 3.87–5.11)
RDW: 15.6 % — ABNORMAL HIGH (ref 11.5–15.5)
WBC: 11.8 10*3/uL — ABNORMAL HIGH (ref 4.0–10.5)
nRBC: 0 % (ref 0.0–0.2)

## 2018-12-27 LAB — BASIC METABOLIC PANEL
Anion gap: 4 — ABNORMAL LOW (ref 5–15)
BUN: 36 mg/dL — ABNORMAL HIGH (ref 8–23)
CO2: 28 mmol/L (ref 22–32)
Calcium: 9 mg/dL (ref 8.9–10.3)
Chloride: 108 mmol/L (ref 98–111)
Creatinine, Ser: 1.29 mg/dL — ABNORMAL HIGH (ref 0.44–1.00)
GFR calc Af Amer: 51 mL/min — ABNORMAL LOW (ref >60)
GFR calc non Af Amer: 44 mL/min — ABNORMAL LOW (ref >60)
Glucose, Bld: 129 mg/dL — ABNORMAL HIGH (ref 70–99)
Potassium: 4.5 mmol/L (ref 3.5–5.1)
Sodium: 140 mmol/L (ref 135–145)

## 2018-12-27 LAB — GLUCOSE, CAPILLARY
Glucose-Capillary: 123 mg/dL — ABNORMAL HIGH (ref 70–99)
Glucose-Capillary: 154 mg/dL — ABNORMAL HIGH (ref 70–99)
Glucose-Capillary: 175 mg/dL — ABNORMAL HIGH (ref 70–99)
Glucose-Capillary: 159 mg/dL — ABNORMAL HIGH (ref 70–99)

## 2018-12-27 LAB — MAGNESIUM: Magnesium: 2.1 mg/dL (ref 1.7–2.4)

## 2018-12-27 LAB — PHOSPHORUS: Phosphorus: 2.6 mg/dL (ref 2.5–4.6)

## 2018-12-27 MED ORDER — OXYCODONE HCL 5 MG PO TABS
5.0000 mg | ORAL_TABLET | Freq: Four times a day (QID) | ORAL | Status: DC | PRN
  Administered 2018-12-27: 5 mg via ORAL
  Filled 2018-12-27: qty 1

## 2018-12-27 MED ORDER — IPRATROPIUM-ALBUTEROL 0.5-2.5 (3) MG/3ML IN SOLN
3.0000 mL | Freq: Three times a day (TID) | RESPIRATORY_TRACT | Status: DC
  Administered 2018-12-27 – 2018-12-28 (×3): 3 mL via RESPIRATORY_TRACT
  Filled 2018-12-27 (×2): qty 3
  Filled 2018-12-27: qty 30

## 2018-12-27 NOTE — Progress Notes (Signed)
PROGRESS NOTE    Crystal Yates  [id number] DOB: 09-16-1955 DOA: 12/24/2018 PCP: Dortha Kern, PA  Outpatient Specialists:   Brief Narrative: Patient is a 63 year old female, morbidly obese, with past medical history significant for COPD, OSA on CPAP, likely obesity hypoventilation syndrome, CHF, Sjogren's syndrome, empty sella syndrome and tobacco abuse that is continuous (patient smokes half pack of cigarettes daily).  Patient presented to the hospital with respiratory distress and altered mentation.  Patient was intubated and admitted to ICU team.  Patient was managed for COPD exacerbation and CHF exacerbation.  Altered mentation is likely secondary to CO2 narcosis.  Altered mentation has resolved.  Patient has been extubated.  Respiratory symptoms have improved significantly.  Patient has been transferred to the medical team ([facility] hospitalist group).  Patient was seen alongside patient's husband.  Patient only feels about 50% improved.  No fever or chills.  Assessment & Plan:   Active Problems:   Hypercapnic respiratory failure (HCC)  Acute on chronic combined hypoxic and hypercapnic respiratory failure: -Patient is status intubation and extubation. -Patient was managed by the ICU team until current transfer to the hospitalist team. -Patient has been extubated. -Continue nebulizer treatment, inhalers and oral hydrocortisone. -Patient has been counseled to quit cigarette use. -Further management will depend on hospital course.  Tobacco use: -This is continuous. -Patient has been counseled.  Acute on chronic systolic congestive heart failure: Seems to have improved significantly. Continue to monitor I's and O's. Continue to assess patient daily and determine need for diuresis.  Morbid obesity/OSA/OHS: Continue CPAP. Consults to comply with CPAP on discharge. Further management of morbid obesity on discharge.  AKI on CKD 3: Renal function is back to baseline.  Continue to monitor closely.  Glucocorticoid dependence due to empty sella syndrome: Continue hydrocortisone.  DVT prophylaxis: Subacute heparin Code Status: Full code Family Communication: Husband Disposition Plan: This will depend on hospital course   Consultants:   Pulmonary team  Procedures:   None  Antimicrobials:   None   Subjective: Shortness of breath is improving. Altered mentation has resolved.  Objective: Vitals:   12/27/18 0742 12/27/18 0745 12/27/18 0800 12/27/18 1219  BP:   (!) 121/56 137/67  Pulse:   88 89  Resp:   18 (!) 24  Temp:   97.6 F (36.4 C) (!) 97.5 F (36.4 C)  TempSrc:   Oral Oral  SpO2: 98% 98% 96% 98%  Weight:      Height:        Intake/Output Summary (Last 24 hours) at 12/27/2018 1511 Last data filed at 12/27/2018 0055 Gross per 24 hour  Intake 360 ml  Output 250 ml  Net 110 ml   Filed Weights   12/25/18 0241 12/26/18 0500 12/27/18 0355  Weight: 109.3 kg 109.1 kg 109.2 kg    Examination:  General exam: Appears calm and comfortable.  Morbidly obese. Respiratory system: Decreased air entry with expiratory wheeze Cardiovascular system: S1 & S2 heard Gastrointestinal system: Abdomen is morbidly obese, nontender.  Organs are difficult to assess.   Central nervous system: Awake and alert.  Patient moves all extremities.   Extremities: Edema of the ankles.  Data Reviewed: I have personally reviewed following labs and imaging studies  CBC: Recent Labs  Lab 12/24/18 0418  12/24/18 0750 12/24/18 2005 12/25/18 0245 12/26/18 0305 12/27/18 0238  WBC 12.1*  --  16.4*  --  13.2* 17.7* 11.8*  NEUTROABS 7.2  --   --   --   --   --  9.5*  HGB 13.0   < > 12.9 12.2 12.1 11.0* 11.1*  HCT 42.4   < > 40.4 36.0 36.7 34.8* 34.6*  MCV 97.9  --  98.1  --  92.4 94.6 94.5  PLT 248  --  249  --  186 203 195   < > = values in this interval not displayed.   Basic Metabolic Panel: Recent Labs  Lab 12/24/18 0418  12/24/18 0712 12/24/18  2005 12/25/18 0245 12/26/18 0305 12/27/18 0238  NA 139   < > 141 142 142 139 140  K 4.0   < > 4.1 3.7 3.6 4.5 4.5  CL 107  --   --   --  109 103 108  CO2 23  --   --   --  24 25 28   GLUCOSE 296*  --   --   --  164* 193* 129*  BUN 23  --   --   --  30* 36* 36*  CREATININE 1.25*  --   --   --  1.56* 1.49* 1.29*  CALCIUM 9.1  --   --   --  8.8* 8.6* 9.0  MG  --   --   --   --  1.9 2.1 2.1  PHOS  --   --   --   --  2.0* 3.5 2.6   < > = values in this interval not displayed.   GFR: Estimated Creatinine Clearance: 51.9 mL/min (A) (by C-G formula based on SCr of 1.29 mg/dL (H)). Liver Function Tests: Recent Labs  Lab 12/24/18 0418  AST 30  ALT 23  ALKPHOS 75  BILITOT 0.4  PROT 6.2*  ALBUMIN 2.8*   No results for input(s): LIPASE, AMYLASE in the last 168 hours. No results for input(s): AMMONIA in the last 168 hours. Coagulation Profile: No results for input(s): INR, PROTIME in the last 168 hours. Cardiac Enzymes: No results for input(s): CKTOTAL, CKMB, CKMBINDEX, TROPONINI in the last 168 hours. BNP (last 3 results) No results for input(s): PROBNP in the last 8760 hours. HbA1C: No results for input(s): HGBA1C in the last 72 hours. CBG: Recent Labs  Lab 12/26/18 1711 12/26/18 2110 12/26/18 2151 12/27/18 0629 12/27/18 1217  GLUCAP 194* 148* 150* 123* 154*   Lipid Profile: No results for input(s): CHOL, HDL, LDLCALC, TRIG, CHOLHDL, LDLDIRECT in the last 72 hours. Thyroid Function Tests: No results for input(s): TSH, T4TOTAL, FREET4, T3FREE, THYROIDAB in the last 72 hours. Anemia Panel: No results for input(s): VITAMINB12, FOLATE, FERRITIN, TIBC, IRON, RETICCTPCT in the last 72 hours. Urine analysis:    Component Value Date/Time   COLORURINE YELLOW 12/24/2018 0830   APPEARANCEUR HAZY (A) 12/24/2018 0830   LABSPEC 1.011 12/24/2018 0830   PHURINE 5.0 12/24/2018 0830   GLUCOSEU NEGATIVE 12/24/2018 0830   HGBUR NEGATIVE 12/24/2018 0830   BILIRUBINUR NEGATIVE  12/24/2018 0830   KETONESUR NEGATIVE 12/24/2018 0830   PROTEINUR NEGATIVE 12/24/2018 0830   NITRITE NEGATIVE 12/24/2018 0830   LEUKOCYTESUR NEGATIVE 12/24/2018 0830   Sepsis Labs: @LABRCNTIP (procalcitonin:4,lacticidven:4)  ) Recent Results (from the past 240 hour(s))  SARS Coronavirus 2 [facility] order, Performed in [facility] hospital lab) Nasopharyngeal Nasopharyngeal Swab     Status: None   Collection Time: 12/24/18  4:47 AM   Specimen: Nasopharyngeal Swab  Result Value Ref Range Status   SARS Coronavirus 2 NEGATIVE NEGATIVE Final    Comment: (NOTE) If result is NEGATIVE SARS-CoV-2 target nucleic acids are NOT DETECTED. The SARS-CoV-2 RNA is generally  detectable in upper and lower  respiratory specimens during the acute phase of infection. The lowest  concentration of SARS-CoV-2 viral copies this assay can detect is 250  copies / mL. A negative result does not preclude SARS-CoV-2 infection  and should not be used as the sole basis for treatment or other  patient management decisions.  A negative result may occur with  improper specimen collection / handling, submission of specimen other  than nasopharyngeal swab, presence of viral mutation(s) within the  areas targeted by this assay, and inadequate number of viral copies  (<250 copies / mL). A negative result must be combined with clinical  observations, patient history, and epidemiological information. If result is POSITIVE SARS-CoV-2 target nucleic acids are DETECTED. The SARS-CoV-2 RNA is generally detectable in upper and lower  respiratory specimens dur ing the acute phase of infection.  Positive  results are indicative of active infection with SARS-CoV-2.  Clinical  correlation with patient history and other diagnostic information is  necessary to determine patient infection status.  Positive results do  not rule out bacterial infection or co-infection with other viruses. If result is PRESUMPTIVE POSTIVE SARS-CoV-2  nucleic acids MAY BE PRESENT.   A presumptive positive result was obtained on the submitted specimen  and confirmed on repeat testing.  While 2019 novel coronavirus  (SARS-CoV-2) nucleic acids may be present in the submitted sample  additional confirmatory testing may be necessary for epidemiological  and / or clinical management purposes  to differentiate between  SARS-CoV-2 and other Sarbecovirus currently known to infect humans.  If clinically indicated additional testing with an alternate test  methodology [id number]) is advised. The SARS-CoV-2 RNA is generally  detectable in upper and lower respiratory sp ecimens during the acute  phase of infection. The expected result is Negative. Fact Sheet for Patients:  StrictlyIdeas.no Fact Sheet for Healthcare Providers: BankingDealers.co.za This test is not yet approved or cleared by the [place] FDA and has been authorized for detection and/or diagnosis of SARS-CoV-2 by FDA under an Emergency Use Authorization (EUA).  This EUA will remain in effect (meaning this test can be used) for the duration of the COVID-19 declaration under Section 564(b)(1) of the Act, 21 U.S.C. section 360bbb-3(b)(1), unless the authorization is terminated or revoked sooner. Performed at [facility], [place] [address], [place], [place] [address]   Culture, blood (routine x 2)     Status: None (Preliminary result)   Collection Time: 12/24/18  7:50 AM   Specimen: BLOOD RIGHT HAND  Result Value Ref Range Status   Specimen Description BLOOD RIGHT HAND  Final   Special Requests   Final    BOTTLES DRAWN AEROBIC ONLY Blood Culture results may not be optimal due to an inadequate volume of blood received in culture bottles   Culture   Final    NO GROWTH 3 DAYS Performed at [facility], [place] [address] [place], [place] [address]    Report Status PENDING  Incomplete  MRSA PCR Screening     Status: None    Collection Time: 12/24/18 11:27 AM   Specimen: Nasal Mucosa; Nasopharyngeal  Result Value Ref Range Status   MRSA by PCR NEGATIVE NEGATIVE Final    Comment:        The GeneXpert MRSA Assay (FDA approved for NASAL specimens only), is one component of a comprehensive MRSA colonization surveillance program. It is not intended to diagnose MRSA infection nor to guide or monitor treatment for MRSA infections. Performed  at [facility], [place] [address] [place], [place] [address]   Culture, blood (routine x 2)     Status: None (Preliminary result)   Collection Time: 12/24/18 11:47 AM   Specimen: BLOOD LEFT HAND  Result Value Ref Range Status   Specimen Description BLOOD LEFT HAND  Final   Special Requests   Final    BOTTLES DRAWN AEROBIC ONLY Blood Culture results may not be optimal due to an inadequate volume of blood received in culture bottles   Culture   Final    NO GROWTH 3 DAYS Performed at [facility], [place] [address], [place], [place] [address]    Report Status PENDING  Incomplete         Radiology Studies: Dg Chest Port 1 View  Result Date: 12/27/2018 CLINICAL DATA:  Shortness of breath, COPD, heart failure EXAM: PORTABLE CHEST 1 VIEW COMPARISON:  12/25/2018 FINDINGS: Interval endotracheal and esophagogastric extubation. Otherwise no significant change in AP portable examination with cardiomegaly and a small left pleural effusion and associated atelectasis or consolidation. No new airspace opacity. IMPRESSION: 1. Interval endotracheal and esophagogastric extubation. 2. Otherwise no significant change in AP portable examination with cardiomegaly and a small left pleural effusion and associated atelectasis or consolidation. No new airspace opacity. Electronically Signed   By: Eddie Candle M.D.   On: 12/27/2018 09:08        Scheduled Meds: . arformoterol  15 mcg Nebulization BID  . atorvastatin  20 mg Oral q1800  . budesonide (PULMICORT) nebulizer solution   0.25 mg Nebulization BID  . Chlorhexidine Gluconate Cloth  6 each Topical Daily  . clopidogrel  75 mg Oral Daily  . DULoxetine  120 mg Oral Daily  . heparin  5,000 Units Subcutaneous Q8H  . hydrocortisone  15 mg Oral q morning - 10a   And  . hydrocortisone  5 mg Oral QHS  . insulin aspart  0-20 Units Subcutaneous TID WC  . insulin aspart  0-5 Units Subcutaneous QHS  . ipratropium-albuterol  3 mL Nebulization BID  . levothyroxine  150 mcg Oral Q0600  . pramipexole  1 mg Oral BID   Continuous Infusions: . sodium chloride Stopped (12/25/18 1351)  . albuterol 10 mg/hr (12/24/18 0624)     LOS: 3 days    Time spent: 82 Minutes    Dana Allan, MD  [facility] Pager #: [phone] 7PM-7AM contact night coverage as above
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
## 2018-12-28 ENCOUNTER — Ambulatory Visit: Payer: Federal, State, Local not specified - PPO | Admitting: Neurology

## 2018-12-28 DIAGNOSIS — J9621 Acute and chronic respiratory failure with hypoxia: Secondary | ICD-10-CM | POA: Diagnosis not present

## 2018-12-28 DIAGNOSIS — J9622 Acute and chronic respiratory failure with hypercapnia: Secondary | ICD-10-CM | POA: Diagnosis not present

## 2018-12-28 LAB — CBC WITH DIFFERENTIAL/PLATELET
Abs Immature Granulocytes: 0.23 10*3/uL — ABNORMAL HIGH (ref 0.00–0.07)
Basophils Absolute: 0.1 10*3/uL (ref 0.0–0.1)
Basophils Relative: 1 %
Eosinophils Absolute: 0.1 10*3/uL (ref 0.0–0.5)
Eosinophils Relative: 2 %
HCT: 37.2 % (ref 36.0–46.0)
Hemoglobin: 12 g/dL (ref 12.0–15.0)
Immature Granulocytes: 3 %
Lymphocytes Relative: 19 %
Lymphs Abs: 1.6 10*3/uL (ref 0.7–4.0)
MCH: 30.5 pg (ref 26.0–34.0)
MCHC: 32.3 g/dL (ref 30.0–36.0)
MCV: 94.4 fL (ref 80.0–100.0)
Monocytes Absolute: 0.7 10*3/uL (ref 0.1–1.0)
Monocytes Relative: 7 %
Neutro Abs: 6.1 10*3/uL (ref 1.7–7.7)
Neutrophils Relative %: 68 %
Platelets: 195 10*3/uL (ref 150–400)
RBC: 3.94 MIL/uL (ref 3.87–5.11)
RDW: 14.9 % (ref 11.5–15.5)
WBC: 8.8 10*3/uL (ref 4.0–10.5)
nRBC: 0 % (ref 0.0–0.2)

## 2018-12-28 LAB — RENAL FUNCTION PANEL
Albumin: 2.6 g/dL — ABNORMAL LOW (ref 3.5–5.0)
Anion gap: 9 (ref 5–15)
BUN: 27 mg/dL — ABNORMAL HIGH (ref 8–23)
CO2: 26 mmol/L (ref 22–32)
Calcium: 9.2 mg/dL (ref 8.9–10.3)
Chloride: 106 mmol/L (ref 98–111)
Creatinine, Ser: 1.1 mg/dL — ABNORMAL HIGH (ref 0.44–1.00)
GFR calc Af Amer: >60 mL/min (ref >60)
GFR calc non Af Amer: 53 mL/min — ABNORMAL LOW (ref >60)
Glucose, Bld: 128 mg/dL — ABNORMAL HIGH (ref 70–99)
Phosphorus: 2.7 mg/dL (ref 2.5–4.6)
Potassium: 4.4 mmol/L (ref 3.5–5.1)
Sodium: 141 mmol/L (ref 135–145)

## 2018-12-28 LAB — GLUCOSE, CAPILLARY
Glucose-Capillary: 175 mg/dL — ABNORMAL HIGH (ref 70–99)
Glucose-Capillary: 120 mg/dL — ABNORMAL HIGH (ref 70–99)

## 2018-12-28 LAB — MAGNESIUM: Magnesium: 2 mg/dL (ref 1.7–2.4)

## 2018-12-28 MED ORDER — HYDROCORTISONE 5 MG PO TABS: 5.0000 mg | ORAL_TABLET | Freq: Every day | ORAL | 0 refills | Status: DC

## 2018-12-28 MED ORDER — HYDROCORTISONE 5 MG PO TABS: 15.0000 mg | ORAL_TABLET | Freq: Every morning | ORAL | 0 refills | Status: DC

## 2018-12-28 MED ORDER — MELATONIN 3 MG PO TABS: 3.0000 mg | ORAL_TABLET | Freq: Every evening | ORAL | 0 refills | Status: AC | PRN

## 2018-12-28 MED ORDER — ARFORMOTEROL TARTRATE 15 MCG/2ML IN NEBU: 15.0000 ug | INHALATION_SOLUTION | Freq: Two times a day (BID) | RESPIRATORY_TRACT | 0 refills | Status: AC

## 2018-12-28 NOTE — Progress Notes (Signed)
Physical Therapy Treatment Patient Details Name: Crystal Yates MRN: [id number] DOB: [birth year] Today's Date: 12/28/2018    History of Present Illness Pt adm with respiratory failure due to copd and chf exacerbation. Pt intubated 9/28-9/29. PMH -  CAD, CHF, DM, HTN, obesity, COPD, CKD and sx hx of lapy for SBO.    PT Comments    Pt did well today - walking further with 2L O2.  Pt safe with rollator. Pt educated on breathing strategies and when to use - her O2 sats stayed 90% and above throughout session.  Will continue to work with pt while she is in the hospital.     Follow Up Recommendations  Home health PT     Equipment Recommendations  None recommended by PT    Recommendations for Other Services       Precautions / Restrictions Precautions Precautions: Fall Restrictions Weight Bearing Restrictions: No    Mobility  Bed Mobility               General bed mobility comments: pt sitting EOB when i arrived  Transfers Overall transfer level: Needs assistance Equipment used: 4-wheeled walker Transfers: Sit to/from Stand Sit to Stand: Supervision         General transfer comment: verbal cues for  hand placement  Ambulation/Gait Ambulation/Gait assistance: Min guard Gait Distance (Feet): 65 Feet Assistive device: 4-wheeled walker Gait Pattern/deviations: Step-through pattern;Decreased stride length;Wide base of support     General Gait Details: Pt ambulated with O2 and rollator. pt safe with rollator and this is what she has at home.  Pt 96% on 2L at rest.  First half of walk - i encouraged pt to not talk and focus on breathing technques - half way 02 sats 95% on 2L.  second half - i let pt talk etc and ending pulse ox 90% and it took her one min to recover.  educated pt on why and when to use her breathing strategies - esp if SOB.   Stairs             Wheelchair Mobility    Modified Rankin (Stroke Patients Only)       Balance      Sitting balance-Leahy Scale: Good       Standing balance-Leahy Scale: Good                              Cognition Arousal/Alertness: Awake/alert Behavior During Therapy: WFL for tasks assessed/performed Overall Cognitive Status: Within Functional Limits for tasks assessed                                        Exercises      General Comments General comments (skin integrity, edema, etc.): Pt stood to pull up a pull-up safely.  pt did well on 2L O2 today      Pertinent Vitals/Pain Pain Assessment: No/denies pain    Home Living                      Prior Function            PT Goals (current goals can now be found in the care plan section) Progress towards PT goals: Progressing toward goals    Frequency    Min 3X/week      PT Plan Current plan remains appropriate  Co-evaluation              AM-PAC PT "6 Clicks" Mobility   Outcome Measure  Help needed turning from your back to your side while in a flat bed without using bedrails?: None Help needed moving from lying on your back to sitting on the side of a flat bed without using bedrails?: None Help needed moving to and from a bed to a chair (including a wheelchair)?: A Little Help needed standing up from a chair using your arms (e.g., wheelchair or bedside chair)?: A Little Help needed to walk in hospital room?: A Little Help needed climbing 3-5 steps with a railing? : A Little 6 Click Score: 20    End of Session Equipment Utilized During Treatment: Oxygen Activity Tolerance: Patient tolerated treatment well Patient left: in chair;with call bell/phone within reach;with family/visitor present Nurse Communication: Mobility status PT Visit Diagnosis: Muscle weakness (generalized) (M62.81)     Time: 1045-1100 PT Time Calculation (min) (ACUTE ONLY): 15 min  Charges:  $Gait Training: 8-22 mins                     12/28/2018   Rande Lawman,  PT    Loyal Buba 12/28/2018, 11:15 AM

## 2018-12-28 NOTE — Progress Notes (Signed)
Occupational Therapy Treatment Patient Details Name: Crystal Yates MRN: [id number] DOB: [birth year] Today's Date: 12/28/2018    History of present illness Pt adm with respiratory failure due to copd and chf exacerbation. Pt intubated 9/28-9/29. PMH -  CAD, CHF, DM, HTN, obesity, COPD, CKD and sx hx of lapy for SBO.   OT comments  Pt on BSC upon entry requiring assist for posterior pericare after BM. Pt required TOTAL A d/t poor standing balance. Pt MIN -guard for stand pivot transfer from BSC>recliner with no AD. Issued pt EC strategies handout and discussed strategies to incorporate into ADL routine at home to maximize functional independence. Pt verbalized understanding and very appreciative of education.  Pt likely to DC home today with HHOT. Will continue to follow acutely for OT needs.   Follow Up Recommendations  Home health OT;Supervision/Assistance - 24 hour    Equipment Recommendations  None recommended by OT;Other (comment)(pt DME needs are met)    Recommendations for Other Services      Precautions / Restrictions Precautions Precautions: Fall Restrictions Weight Bearing Restrictions: No       Mobility Bed Mobility               General bed mobility comments: oob upon arrival  Transfers Overall transfer level: Needs assistance Equipment used: None Transfers: Sit to/from Stand Sit to Stand: Supervision         General transfer comment: verbal cues for  hand placement    Balance Overall balance assessment: Needs assistance Sitting-balance support: No upper extremity supported;Feet supported Sitting balance-Leahy Scale: Good     Standing balance support: No upper extremity supported;During functional activity Standing balance-Leahy Scale: Good                             ADL either performed or assessed with clinical judgement   ADL                           Toilet Transfer: Min Corporate treasurer Details (indicate cue type and reason): MIN guard stand pivot from [facility] with no AD Toileting- Clothing Manipulation and Hygiene: Total assistance;Sit to/from stand Toileting - Clothing Manipulation Details (indicate cue type and reason): Total A for posterior pericare after BM; pt reports she has a  bidet at home for pericare     Functional mobility during ADLs: Min guard General ADL Comments: MIN guard for stand pivot transfers with no AD from [facility] >recliner; total A for pericare; pt seems to have a good ADL set-up at home; reports she uses sock aid for LB dressing and reacher; issued EC handout     Vision Patient Visual Report: No change from baseline     Perception     Praxis      Cognition Arousal/Alertness: Awake/alert Behavior During Therapy: WFL for tasks assessed/performed Overall Cognitive Status: Within Functional Limits for tasks assessed                                          Exercises     Shoulder Instructions       General Comments pts husband present throughout session; pt on 2 L during session with O2 99    Pertinent Vitals/ Pain       Pain Assessment: No/denies pain  Home Living  Prior Functioning/Environment              Frequency  Min 2X/week        Progress Toward Goals  OT Goals(current goals can now be found in the care plan section)  Progress towards OT goals: Progressing toward goals  Acute Rehab OT Goals Patient Stated Goal: return home OT Goal Formulation: With patient Time For Goal Achievement: 01/09/19 Potential to Achieve Goals: Good  Plan Discharge plan remains appropriate    Co-evaluation                 AM-PAC OT "6 Clicks" Daily Activity     Outcome Measure   Help from another person eating meals?: None Help from another person taking care of personal grooming?: A Little Help from another person toileting, which includes  using toliet, bedpan, or urinal?: A Lot Help from another person bathing (including washing, rinsing, drying)?: A Lot Help from another person to put on and taking off regular upper body clothing?: None Help from another person to put on and taking off regular lower body clothing?: A Lot 6 Click Score: 17    End of Session Equipment Utilized During Treatment: Oxygen  OT Visit Diagnosis: Muscle weakness (generalized) (M62.81);Unsteadiness on feet (R26.81);Other (comment)   Activity Tolerance Patient tolerated treatment well   Patient Left in bed;with call bell/phone within reach   Nurse Communication          Time: 1214-1226 OT Time Calculation (min): 12 min  Charges: OT General Charges $OT Visit: 1 Visit OT Treatments $Self Care/Home Management : 8-22 mins  [place], [place] [phone] [place] 12/28/2018, 1:55 PM

## 2018-12-28 NOTE — Discharge Summary (Signed)
Physician Discharge Summary  Patient ID: Crystal Yates MRN: [id number] DOB/AGE: [birth year] 63 y.o.  Admit date: 12/24/2018 Discharge date: 12/28/2018  Admission Diagnoses:  Discharge Diagnoses:  Active Problems:  Acute on chronic hypercapnic respiratory failure (HCC) COPD with exacerbation. Morbid obesity. Obstructive sleep apnea Likely obesity hypoventilation syndrome Tobacco use disorder, continuous. Possible acute on chronic systolic congestive heart failure. Acute kidney injury on chronic kidney disease stage III. History of empty sella syndrome on glucocorticoid.  Discharged Condition: stable  Hospital Course: Patient is a 63 year old female, morbidly obese, with past medical history significant for COPD on home oxygen, OSA on CPAP, likely obesity hypoventilation syndrome, CHF, Sjogren's syndrome, empty sella syndrome on glucocorticoid and tobacco abuse that is continuous (patient smokes half pack of cigarettes daily).  Patient presented to the hospital with respiratory distress and altered mentation.  Patient was intubated and admitted to ICU team.  Patient was managed for COPD exacerbation and CHF exacerbation.  Altered mentation was likely secondary to CO2 narcosis.  Altered mentation has resolved.  Patient was extubated and eventually transferred to the hospitalist team.  Acute respiratory symptoms have improved significantly.  Patient is back to her baseline, and will be discharged back to the care of the primary care provider.  Acute on chronic combined hypoxic and hypercapnic respiratory failure: -Patient is status intubation and extubation. -Patient was managed by the ICU team, and was transferred later to the hospitalist team.  -Patient was managed with inhalers, including duo nebs and steroids.     -Patient has been counseled to quit cigarette use. -Patient is back to her baseline, and will be discharged back to the care of the primary care provider.  Tobacco  use: -This is continuous. -Patient was counseled to quit tobacco use.  Acute on chronic systolic congestive heart failure: -This has resolved.   -Continue to monitor closely on discharge.  Morbid obesity/OSA/OHS: -CPAP was continued.  Acute kidney injury on chronic kidney disease stage III:  -Acute kidney injury has resolved.   -Renal function is back to baseline.   -Acute kidney injury could be secondary to cardiorenal syndrome.   -Continue to monitor closely.    Glucocorticoid dependence due to empty sella syndrome: - Continue hydrocortisone.  Consults: Patient was intubated, and initially managed by the critical care team.  Patient was eventually transferred to the hospitalist team.  Discharge Exam: Blood pressure 127/87, pulse 89, temperature 98.3 F (36.8 C), temperature source Oral, resp. rate (!) 21, height 5\' 2"  (1.575 m), weight 108.8 kg, SpO2 97 %.  Disposition: Discharge disposition: 01-Home or Self Care   Discharge Instructions    Diet - low sodium heart healthy   Complete by: As directed    Diet - low sodium heart healthy   Complete by: As directed    Diet Carb Modified   Complete by: As directed    Increase activity slowly   Complete by: As directed    Increase activity slowly   Complete by: As directed      Allergies as of 12/28/2018      Reactions   Prednisone Other (See Comments)   Hallucinations and psychosis   Buprenorphine Other (See Comments)   unk   Hydroxychloroquine Other (See Comments)   unk   Tape Other (See Comments)   unk   Varenicline Other (See Comments)   unk      Medication List    STOP taking these medications   albuterol (2.5 MG/3ML) 0.083% nebulizer solution Commonly known as: PROVENTIL  hydrOXYzine 25 MG tablet Commonly known as: ATARAX/VISTARIL   LORazepam 0.5 MG tablet Commonly known as: ATIVAN   tiZANidine 4 MG tablet Commonly known as: ZANAFLEX   zolpidem 5 MG tablet Commonly known as: AMBIEN      TAKE these medications   arformoterol 15 MCG/2ML Nebu Commonly known as: BROVANA Take 2 mLs (15 mcg total) by nebulization 2 (two) times daily.   Aspirin Low Dose 81 MG EC tablet Generic drug: aspirin Take 81 mg by mouth daily.   atorvastatin 20 MG tablet Commonly known as: LIPITOR Take 20 mg by mouth daily at 6 PM.   clopidogrel 75 MG tablet Commonly known as: PLAVIX Take 75 mg by mouth daily.   DULoxetine 60 MG capsule Commonly known as: CYMBALTA Take 120 mg by mouth daily.   Emgality 120 MG/ML Soaj Generic drug: Galcanezumab-gnlm Inject 120 mg into the skin every 30 (thirty) days.   Entresto 24-26 MG Generic drug: sacubitril-valsartan Take 1 tablet by mouth 2 (two) times daily.   furosemide 40 MG tablet Commonly known as: LASIX Take 40 mg by mouth daily.   Fusion Plus Caps Take 1 capsule by mouth daily.   hydrocortisone 5 MG tablet Commonly known as: CORTEF Take 1 tablet (5 mg total) by mouth at bedtime.   hydrocortisone 5 MG tablet Commonly known as: CORTEF Take 3 tablets (15 mg total) by mouth every morning. Start taking on: December 29, 2018   ipratropium-albuterol 0.5-2.5 (3) MG/3ML Soln Commonly known as: DUONEB Take 3 mLs by nebulization every 6 (six) hours as needed (SHORTNESS OF BREATH, WHEEZING).   MAGnesium-Oxide 400 (241.3 Mg) MG tablet Generic drug: magnesium oxide Take 400 mg by mouth daily.   Melatonin 3 MG Tabs Take 1 tablet (3 mg total) by mouth at bedtime as needed (insomnia).   metolazone 2.5 MG tablet Commonly known as: ZAROXOLYN   nitroGLYCERIN 0.4 MG SL tablet Commonly known as: NITROSTAT Place 1 tablet under the tongue every 5 (five) minutes x 3 doses as needed for chest pain.   oxyCODONE 5 MG immediate release tablet Commonly known as: Oxy IR/ROXICODONE Take 5 mg by mouth every 8 (eight) hours as needed for severe pain.   potassium chloride SA 20 MEQ tablet Commonly known as: KLOR-CON Take 20 mEq by mouth 2 (two) times  daily.   pramipexole 1 MG tablet Commonly known as: MIRAPEX Take 1 mg by mouth 2 (two) times daily.   Synthroid 150 MCG tablet Generic drug: levothyroxine Take 150 mcg by mouth daily before breakfast.   Trelegy Ellipta 100-62.5-25 MCG/INH Aepb Generic drug: Fluticasone-Umeclidin-Vilant Inhale 1 puff into the lungs daily.   Tyler Aas FlexTouch 100 UNIT/ML Sopn FlexTouch Pen Generic drug: insulin degludec Inject 40 Units into the skin daily.   Trulicity 1.5 0000000 Sopn Generic drug: Dulaglutide Inject 1.5 mg into the skin every 7 (seven) days.        SignedBonnell Public 12/28/2018, 11:46 AM

## 2018-12-28 NOTE — TOC Transition Note (Signed)
Transition of Care [facility]) - CM/SW Discharge Note Marvetta Gibbons RN, BSN Transitions of Care Unit 4E- RN Case Manager [phone]   Patient Details  Name: Crystal Yates MRN: [id number] Date of Birth: 24-Feb-1956  Transition of Care [facility]) CM/SW Contact:  Dawayne Patricia, RN Phone Number: 12/28/2018, 2:41 PM   Clinical Narrative:    Pt stable for transition home today, orders placed for HHRN/PT/OT, - CM in to speak with pt at bedside- list provided for choice Per CMS guidelines from medicare.gov website with star ratings (copy placed in shadow chart)- per pt she would like to use [facility] for [facility] needs- call made to [facility] with Alvis Lemmings for [facility] referral- referral has been accepted for HHRN/PT/OT- Pt has transportation home, address, phone # and PCP all confirmed with pt in epic. Pt is missing her clothes she had on when she arrived at hospital- unit trying to locate clothes for pt.   Final next level of care: [place] Barriers to Discharge: No Barriers Identified   Patient Goals and CMS Choice Patient states their goals for this hospitalization and ongoing recovery are:: "to get out of here and move to [place]" CMS Medicare.gov Compare Post Acute Care list provided to:: Patient Choice offered to / list presented to : Patient  Discharge Placement            Home with [facility]           Discharge Plan and Services   Discharge Planning Services: CM Consult Post Acute Care Choice: Home Health          DME Arranged: N/A DME Agency: NA       HH Arranged: RN, Disease Management, PT, OT HH Agency: [place] Date [place]: 12/28/18 Time [place]: 1205 Representative spoke with at [place]: [place] ([place]) Interventions     Readmission Risk Interventions Readmission Risk Prevention Plan 12/28/2018  Transportation Screening Complete  PCP or Specialist Appt within 5-7 Days Complete  Home Care  Screening Complete  Medication Review (RN CM) Complete

## 2018-12-29 DIAGNOSIS — J9691 Respiratory failure, unspecified with hypoxia: Secondary | ICD-10-CM | POA: Diagnosis not present

## 2018-12-29 DIAGNOSIS — N179 Acute kidney failure, unspecified: Secondary | ICD-10-CM | POA: Diagnosis not present

## 2018-12-29 DIAGNOSIS — J449 Chronic obstructive pulmonary disease, unspecified: Secondary | ICD-10-CM | POA: Diagnosis not present

## 2018-12-29 DIAGNOSIS — Z7952 Long term (current) use of systemic steroids: Secondary | ICD-10-CM | POA: Diagnosis not present

## 2018-12-29 DIAGNOSIS — I5022 Chronic systolic (congestive) heart failure: Secondary | ICD-10-CM | POA: Diagnosis not present

## 2018-12-29 DIAGNOSIS — Z7982 Long term (current) use of aspirin: Secondary | ICD-10-CM | POA: Diagnosis not present

## 2018-12-29 DIAGNOSIS — E1122 Type 2 diabetes mellitus with diabetic chronic kidney disease: Secondary | ICD-10-CM | POA: Diagnosis not present

## 2018-12-29 DIAGNOSIS — M3500 Sicca syndrome, unspecified: Secondary | ICD-10-CM | POA: Diagnosis not present

## 2018-12-29 DIAGNOSIS — E669 Obesity, unspecified: Secondary | ICD-10-CM | POA: Diagnosis not present

## 2018-12-29 DIAGNOSIS — E039 Hypothyroidism, unspecified: Secondary | ICD-10-CM | POA: Diagnosis not present

## 2018-12-29 DIAGNOSIS — N183 Chronic kidney disease, stage 3 unspecified: Secondary | ICD-10-CM | POA: Diagnosis not present

## 2018-12-29 DIAGNOSIS — M519 Unspecified thoracic, thoracolumbar and lumbosacral intervertebral disc disorder: Secondary | ICD-10-CM | POA: Diagnosis not present

## 2018-12-29 DIAGNOSIS — Z955 Presence of coronary angioplasty implant and graft: Secondary | ICD-10-CM | POA: Diagnosis not present

## 2018-12-29 DIAGNOSIS — Z79891 Long term (current) use of opiate analgesic: Secondary | ICD-10-CM | POA: Diagnosis not present

## 2018-12-29 DIAGNOSIS — J9692 Respiratory failure, unspecified with hypercapnia: Secondary | ICD-10-CM | POA: Diagnosis not present

## 2018-12-29 DIAGNOSIS — F17210 Nicotine dependence, cigarettes, uncomplicated: Secondary | ICD-10-CM | POA: Diagnosis not present

## 2018-12-29 LAB — CULTURE, BLOOD (ROUTINE X 2)
Culture: NO GROWTH
Culture: NO GROWTH

## 2018-12-30 DIAGNOSIS — J9622 Acute and chronic respiratory failure with hypercapnia: Secondary | ICD-10-CM

## 2018-12-30 DIAGNOSIS — J9621 Acute and chronic respiratory failure with hypoxia: Secondary | ICD-10-CM

## 2018-12-31 ENCOUNTER — Encounter: Payer: Self-pay | Admitting: Family

## 2018-12-31 DIAGNOSIS — J449 Chronic obstructive pulmonary disease, unspecified: Secondary | ICD-10-CM | POA: Diagnosis not present

## 2018-12-31 DIAGNOSIS — E1122 Type 2 diabetes mellitus with diabetic chronic kidney disease: Secondary | ICD-10-CM | POA: Diagnosis not present

## 2018-12-31 DIAGNOSIS — J9691 Respiratory failure, unspecified with hypoxia: Secondary | ICD-10-CM | POA: Diagnosis not present

## 2018-12-31 DIAGNOSIS — F17210 Nicotine dependence, cigarettes, uncomplicated: Secondary | ICD-10-CM | POA: Diagnosis not present

## 2018-12-31 DIAGNOSIS — I5022 Chronic systolic (congestive) heart failure: Secondary | ICD-10-CM | POA: Diagnosis not present

## 2018-12-31 DIAGNOSIS — Z7982 Long term (current) use of aspirin: Secondary | ICD-10-CM | POA: Diagnosis not present

## 2018-12-31 DIAGNOSIS — E669 Obesity, unspecified: Secondary | ICD-10-CM | POA: Diagnosis not present

## 2018-12-31 DIAGNOSIS — M3500 Sicca syndrome, unspecified: Secondary | ICD-10-CM | POA: Diagnosis not present

## 2018-12-31 DIAGNOSIS — N183 Chronic kidney disease, stage 3 unspecified: Secondary | ICD-10-CM | POA: Diagnosis not present

## 2018-12-31 DIAGNOSIS — M519 Unspecified thoracic, thoracolumbar and lumbosacral intervertebral disc disorder: Secondary | ICD-10-CM | POA: Diagnosis not present

## 2018-12-31 DIAGNOSIS — E039 Hypothyroidism, unspecified: Secondary | ICD-10-CM | POA: Diagnosis not present

## 2018-12-31 DIAGNOSIS — Z79891 Long term (current) use of opiate analgesic: Secondary | ICD-10-CM | POA: Diagnosis not present

## 2018-12-31 DIAGNOSIS — N179 Acute kidney failure, unspecified: Secondary | ICD-10-CM | POA: Diagnosis not present

## 2018-12-31 DIAGNOSIS — J9692 Respiratory failure, unspecified with hypercapnia: Secondary | ICD-10-CM | POA: Diagnosis not present

## 2018-12-31 DIAGNOSIS — Z955 Presence of coronary angioplasty implant and graft: Secondary | ICD-10-CM | POA: Diagnosis not present

## 2018-12-31 DIAGNOSIS — Z7952 Long term (current) use of systemic steroids: Secondary | ICD-10-CM | POA: Diagnosis not present

## 2019-01-01 DIAGNOSIS — M3500 Sicca syndrome, unspecified: Secondary | ICD-10-CM | POA: Diagnosis not present

## 2019-01-01 DIAGNOSIS — Z7952 Long term (current) use of systemic steroids: Secondary | ICD-10-CM | POA: Diagnosis not present

## 2019-01-01 DIAGNOSIS — E039 Hypothyroidism, unspecified: Secondary | ICD-10-CM | POA: Diagnosis not present

## 2019-01-01 DIAGNOSIS — J9691 Respiratory failure, unspecified with hypoxia: Secondary | ICD-10-CM | POA: Diagnosis not present

## 2019-01-01 DIAGNOSIS — E669 Obesity, unspecified: Secondary | ICD-10-CM | POA: Diagnosis not present

## 2019-01-01 DIAGNOSIS — Z7982 Long term (current) use of aspirin: Secondary | ICD-10-CM | POA: Diagnosis not present

## 2019-01-01 DIAGNOSIS — Z79891 Long term (current) use of opiate analgesic: Secondary | ICD-10-CM | POA: Diagnosis not present

## 2019-01-01 DIAGNOSIS — Z955 Presence of coronary angioplasty implant and graft: Secondary | ICD-10-CM | POA: Diagnosis not present

## 2019-01-01 DIAGNOSIS — N179 Acute kidney failure, unspecified: Secondary | ICD-10-CM | POA: Diagnosis not present

## 2019-01-01 DIAGNOSIS — M545 Low back pain: Secondary | ICD-10-CM | POA: Diagnosis not present

## 2019-01-01 DIAGNOSIS — M519 Unspecified thoracic, thoracolumbar and lumbosacral intervertebral disc disorder: Secondary | ICD-10-CM | POA: Diagnosis not present

## 2019-01-01 DIAGNOSIS — F17210 Nicotine dependence, cigarettes, uncomplicated: Secondary | ICD-10-CM | POA: Diagnosis not present

## 2019-01-01 DIAGNOSIS — J449 Chronic obstructive pulmonary disease, unspecified: Secondary | ICD-10-CM | POA: Diagnosis not present

## 2019-01-01 DIAGNOSIS — E1122 Type 2 diabetes mellitus with diabetic chronic kidney disease: Secondary | ICD-10-CM | POA: Diagnosis not present

## 2019-01-01 DIAGNOSIS — J9692 Respiratory failure, unspecified with hypercapnia: Secondary | ICD-10-CM | POA: Diagnosis not present

## 2019-01-01 DIAGNOSIS — I5022 Chronic systolic (congestive) heart failure: Secondary | ICD-10-CM | POA: Diagnosis not present

## 2019-01-01 DIAGNOSIS — N183 Chronic kidney disease, stage 3 unspecified: Secondary | ICD-10-CM | POA: Diagnosis not present

## 2019-01-03 DIAGNOSIS — M545 Low back pain: Secondary | ICD-10-CM | POA: Diagnosis not present

## 2019-01-04 DIAGNOSIS — J449 Chronic obstructive pulmonary disease, unspecified: Secondary | ICD-10-CM | POA: Diagnosis not present

## 2019-01-04 DIAGNOSIS — I5022 Chronic systolic (congestive) heart failure: Secondary | ICD-10-CM | POA: Diagnosis not present

## 2019-01-04 DIAGNOSIS — E1122 Type 2 diabetes mellitus with diabetic chronic kidney disease: Secondary | ICD-10-CM | POA: Diagnosis not present

## 2019-01-04 DIAGNOSIS — M3500 Sicca syndrome, unspecified: Secondary | ICD-10-CM | POA: Diagnosis not present

## 2019-01-04 DIAGNOSIS — N179 Acute kidney failure, unspecified: Secondary | ICD-10-CM | POA: Diagnosis not present

## 2019-01-04 DIAGNOSIS — Z955 Presence of coronary angioplasty implant and graft: Secondary | ICD-10-CM | POA: Diagnosis not present

## 2019-01-04 DIAGNOSIS — N183 Chronic kidney disease, stage 3 unspecified: Secondary | ICD-10-CM | POA: Diagnosis not present

## 2019-01-04 DIAGNOSIS — J9691 Respiratory failure, unspecified with hypoxia: Secondary | ICD-10-CM | POA: Diagnosis not present

## 2019-01-04 DIAGNOSIS — Z7982 Long term (current) use of aspirin: Secondary | ICD-10-CM | POA: Diagnosis not present

## 2019-01-04 DIAGNOSIS — M519 Unspecified thoracic, thoracolumbar and lumbosacral intervertebral disc disorder: Secondary | ICD-10-CM | POA: Diagnosis not present

## 2019-01-04 DIAGNOSIS — J9692 Respiratory failure, unspecified with hypercapnia: Secondary | ICD-10-CM | POA: Diagnosis not present

## 2019-01-04 DIAGNOSIS — E669 Obesity, unspecified: Secondary | ICD-10-CM | POA: Diagnosis not present

## 2019-01-04 DIAGNOSIS — F17210 Nicotine dependence, cigarettes, uncomplicated: Secondary | ICD-10-CM | POA: Diagnosis not present

## 2019-01-04 DIAGNOSIS — Z7952 Long term (current) use of systemic steroids: Secondary | ICD-10-CM | POA: Diagnosis not present

## 2019-01-04 DIAGNOSIS — Z79891 Long term (current) use of opiate analgesic: Secondary | ICD-10-CM | POA: Diagnosis not present

## 2019-01-04 DIAGNOSIS — E039 Hypothyroidism, unspecified: Secondary | ICD-10-CM | POA: Diagnosis not present

## 2019-01-07 ENCOUNTER — Telehealth: Payer: Self-pay | Admitting: Cardiology

## 2019-01-07 NOTE — Telephone Encounter (Signed)
Called patient she reports yesterday her weight was 235lb and today it is 241 lb. She had a 6lb increase in her weight overnight. She had increased shortness of breath, and leg swelling overnight as well. She is taking lasix 40 mg twice daily. On Saturday she took a extra 40 mg tablet and today she has taken a extra 40 mg tablet totaling at 120 mg for today. I will consult with Dr. Agustin Cree on what next steps are since she doesn't seem to be improving.

## 2019-01-07 NOTE — Telephone Encounter (Signed)
Patient called stating her "water weight is going up" and she needs to talk to someone to find out what she needs to do  Please call patient to discuss

## 2019-01-08 ENCOUNTER — Ambulatory Visit: Payer: Federal, State, Local not specified - PPO | Admitting: Internal Medicine

## 2019-01-08 DIAGNOSIS — F419 Anxiety disorder, unspecified: Secondary | ICD-10-CM | POA: Diagnosis not present

## 2019-01-08 DIAGNOSIS — E119 Type 2 diabetes mellitus without complications: Secondary | ICD-10-CM | POA: Diagnosis not present

## 2019-01-08 DIAGNOSIS — J438 Other emphysema: Secondary | ICD-10-CM | POA: Diagnosis not present

## 2019-01-08 DIAGNOSIS — I2510 Atherosclerotic heart disease of native coronary artery without angina pectoris: Secondary | ICD-10-CM | POA: Diagnosis not present

## 2019-01-08 NOTE — Telephone Encounter (Signed)
Call her today and ask how is the weight  It is impossible to gain 6 lb over night - that is 3 l of fluid, did she drink 3l?

## 2019-01-08 NOTE — Telephone Encounter (Signed)
Called patient. She reports her weight today is 239, so she is down 2 lbs. I asked her if she drank a lot of fluid by chance she denied this. The swelling and shortness of breath are the same.

## 2019-01-09 NOTE — Telephone Encounter (Signed)
Dr. Agustin Cree advised patient increase lasix to 60 mg daily in the morning and 40 mg in the pm daily until she returns to normal weight then go back to 40 mg twice daily. Patient verbally understands and will call us if things do not get better. She verbally understands. No further questions.

## 2019-01-10 DIAGNOSIS — N183 Chronic kidney disease, stage 3 unspecified: Secondary | ICD-10-CM | POA: Diagnosis not present

## 2019-01-10 DIAGNOSIS — J449 Chronic obstructive pulmonary disease, unspecified: Secondary | ICD-10-CM | POA: Diagnosis not present

## 2019-01-10 DIAGNOSIS — Z7952 Long term (current) use of systemic steroids: Secondary | ICD-10-CM | POA: Diagnosis not present

## 2019-01-10 DIAGNOSIS — Z79891 Long term (current) use of opiate analgesic: Secondary | ICD-10-CM | POA: Diagnosis not present

## 2019-01-10 DIAGNOSIS — J9692 Respiratory failure, unspecified with hypercapnia: Secondary | ICD-10-CM | POA: Diagnosis not present

## 2019-01-10 DIAGNOSIS — N179 Acute kidney failure, unspecified: Secondary | ICD-10-CM | POA: Diagnosis not present

## 2019-01-10 DIAGNOSIS — F17210 Nicotine dependence, cigarettes, uncomplicated: Secondary | ICD-10-CM | POA: Diagnosis not present

## 2019-01-10 DIAGNOSIS — Z7982 Long term (current) use of aspirin: Secondary | ICD-10-CM | POA: Diagnosis not present

## 2019-01-10 DIAGNOSIS — I5022 Chronic systolic (congestive) heart failure: Secondary | ICD-10-CM | POA: Diagnosis not present

## 2019-01-10 DIAGNOSIS — E669 Obesity, unspecified: Secondary | ICD-10-CM | POA: Diagnosis not present

## 2019-01-10 DIAGNOSIS — M519 Unspecified thoracic, thoracolumbar and lumbosacral intervertebral disc disorder: Secondary | ICD-10-CM | POA: Diagnosis not present

## 2019-01-10 DIAGNOSIS — M3500 Sicca syndrome, unspecified: Secondary | ICD-10-CM | POA: Diagnosis not present

## 2019-01-10 DIAGNOSIS — Z955 Presence of coronary angioplasty implant and graft: Secondary | ICD-10-CM | POA: Diagnosis not present

## 2019-01-10 DIAGNOSIS — E039 Hypothyroidism, unspecified: Secondary | ICD-10-CM | POA: Diagnosis not present

## 2019-01-10 DIAGNOSIS — E1122 Type 2 diabetes mellitus with diabetic chronic kidney disease: Secondary | ICD-10-CM | POA: Diagnosis not present

## 2019-01-10 DIAGNOSIS — J9691 Respiratory failure, unspecified with hypoxia: Secondary | ICD-10-CM | POA: Diagnosis not present

## 2019-02-27 ENCOUNTER — Telehealth: Payer: Self-pay | Admitting: Cardiology

## 2019-02-27 NOTE — Telephone Encounter (Signed)
Patient wants her 03/01/2019 visit to be a virtual visit.  Please call patient to discuss

## 2019-02-27 NOTE — Telephone Encounter (Signed)
Called patient, she prefers virtual visit due to increasing covid numbers. Appointment switched.

## 2019-03-01 ENCOUNTER — Telehealth (INDEPENDENT_AMBULATORY_CARE_PROVIDER_SITE_OTHER): Payer: Federal, State, Local not specified - PPO | Admitting: Cardiology

## 2019-03-01 ENCOUNTER — Other Ambulatory Visit: Payer: Self-pay

## 2019-03-01 ENCOUNTER — Encounter: Payer: Self-pay | Admitting: Cardiology

## 2019-03-01 VITALS — BP 157/86 | HR 91

## 2019-03-01 DIAGNOSIS — J438 Other emphysema: Secondary | ICD-10-CM

## 2019-03-01 DIAGNOSIS — I255 Ischemic cardiomyopathy: Secondary | ICD-10-CM

## 2019-03-01 DIAGNOSIS — I2510 Atherosclerotic heart disease of native coronary artery without angina pectoris: Secondary | ICD-10-CM

## 2019-03-01 DIAGNOSIS — I10 Essential (primary) hypertension: Secondary | ICD-10-CM | POA: Diagnosis not present

## 2019-03-01 DIAGNOSIS — Z720 Tobacco use: Secondary | ICD-10-CM

## 2019-03-01 NOTE — Patient Instructions (Signed)
Medication Instructions:  .Your physician recommends that you continue on your current medications as directed. Please refer to the Current Medication list given to you today.   *If you need a refill on your cardiac medications before your next appointment, please call your pharmacy*  Lab Work: None.  If you have labs (blood work) drawn today and your tests are completely normal, you will receive your results only by: Marland Kitchen MyChart Message (if you have MyChart) OR . A paper copy in the mail If you have any lab test that is abnormal or we need to change your treatment, we will call you to review the results.  Testing/Procedures: None.  Follow-Up: At [facility], you and your health needs are our priority.  As part of our continuing mission to provide you with exceptional heart care, we have created designated Provider Care Teams.  These Care Teams include your primary Cardiologist (physician) and Advanced Practice Providers (APPs -  Physician Assistants and Nurse Practitioners) who all work together to provide you with the care you need, when you need it.  Your next appointment:   Follow up as needed   Patient contacted and informed we will see her as needed since she has relocated to [place]. She verbally understood. No further questions.

## 2019-03-01 NOTE — Progress Notes (Signed)
Virtual Visit via Video Note   This visit type was conducted due to national recommendations for restrictions regarding the COVID-19 Pandemic (e.g. social distancing) in an effort to limit this patient's exposure and mitigate transmission in our community.  Due to her co-morbid illnesses, this patient is at least at moderate risk for complications without adequate follow up.  This format is felt to be most appropriate for this patient at this time.  All issues noted in this document were discussed and addressed.  A limited physical exam was performed with this format.  Please refer to the patient's chart for her consent to telehealth for [facility]  Evaluation Performed:  Follow-up visit  This visit type was conducted due to national recommendations for restrictions regarding the COVID-19 Pandemic (e.g. social distancing).  This format is felt to be most appropriate for this patient at this time.  All issues noted in this document were discussed and addressed.  No physical exam was performed (except for noted visual exam findings with Video Visits).  Please refer to the patient's chart (MyChart message for video visits and phone note for telephone visits) for the patient's consent to telehealth for [facility].  Date:  03/01/2019  ID: Crystal Yates, DOB 1955/08/18, MRN [id number]   Patient Location: [place] [address]   Provider location:   [place] Office  PCP:  Dortha Kern, [place]  Cardiologist:  Jenne Campus, MD     Chief Complaint: I am having some chest pain on and off  History of Present Illness:    Crystal Yates is a 63 y.o. female  who presents via audio/video conferencing for a telehealth visit today.  With very complex past medical history.  She does have cardiomyopathy which is ischemic in origin, last echocardiogram done in August 2020 showed ejection fraction 35 to 40%, also advanced COPD, coronary artery  disease status post PTCA and stenting of LAD and RCA in January 2018, chronic kidney failure, diabetes, smoking which is still ongoing.  She recently relocated to [place] she wanted to have a televisit with [place] to discuss her issues overall she feels that she does well in September she ended up going to the hospital because of respiratory distress.  That required intubation however after that she seems to be doing well.  She ended up going 1 more time in a hospital in [place] already because of decompensated CHF.  Look like she was very well managed and discharged home and seems to be doing well but complained to having some episode of chest tightness that happened in different situation she could be sitting and get this sensation.  She is trying to determine if this is more pulmonary or heart related which is of course somewhat difficult.  Her weight did not change significantly.  She did not try nitroglycerin.  Her breathing appears to be stable.  I told her to try nitroglycerin when she has the sensation and if nitroglycerin helps that we will indicate that this is probably heart related.  She already find cardiologist in [place] and she will follow up with him.  I told her if there is anything we can help her with will be more than glad to do that.  In my opinion she probably will require a Chem-7 to check her kidney function as well as potassium probably proBNP to objectively try and assess her fluid status.   The patient does not have symptoms concerning for COVID-19 infection (  fever, chills, cough, or new SHORTNESS OF BREATH).    Prior CV studies:   The following studies were reviewed today:       Past Medical History:  Diagnosis Date  . Acute on chronic systolic heart failure exacerbation(HCC) 04/08/2016  . Arthritis   . CAD in native artery    a. Prior LAD stenting based on cath. b. RCA stenting 03/2016 x2.  . Chronic combined systolic and diastolic CHF (congestive heart failure) ([place])   . CKD  (chronic kidney disease), stage II   . COPD (chronic obstructive pulmonary disease) ([place])   . Diabetes mellitus without complication ([place])   . DM (diabetes mellitus) ([place])   . Hashimoto's thyroiditis   . Heart failure ([place])   . History of blood transfusion   . Hyperlipidemia   . Hypertension   . On home O2   . On home oxygen therapy    "2L; 24/7" (10/23/2017)  . Secondary adrenal insufficiency ([place])   . Sjogren's syndrome ([place])   . Smoker   . Thyroid disease   . Tobacco abuse     Past Surgical History:  Procedure Laterality Date  . ABDOMINAL SURGERY    . CESAREAN SECTION    . CHOLECYSTECTOMY    . COLON RESECTION    . COLONOSCOPY WITH PROPOFOL N/A 01/16/2018   Procedure: COLONOSCOPY WITH PROPOFOL;  Surgeon: Rush Landmark Telford Nab., MD;  Location: [facility];  Service: Gastroenterology;  Laterality: N/A;  . CORONARY BALLOON ANGIOPLASTY N/A 10/13/2017   Procedure: CORONARY BALLOON ANGIOPLASTY;  Surgeon: Belva Crome, MD;  Location: [facility];  Service: Cardiovascular;  Laterality: N/A;  . HERNIA MESH REMOVAL    . HERNIA REPAIR    . LEFT HEART CATH AND CORONARY ANGIOGRAPHY N/A 10/13/2016   Procedure: Left Heart Cath and Coronary Angiography;  Surgeon: Nelva Bush, MD;  Location: [place];  Service: Cardiovascular;  Laterality: N/A;  . POLYPECTOMY  01/16/2018   Procedure: POLYPECTOMY;  Surgeon: Rush Landmark Telford Nab., MD;  Location: [facility];  Service: Gastroenterology;;  . RIGHT/LEFT HEART CATH AND CORONARY ANGIOGRAPHY N/A 10/13/2017   Procedure: RIGHT/LEFT HEART CATH AND CORONARY ANGIOGRAPHY;  Surgeon: Belva Crome, MD;  Location: [facility] [facility];  Service: Cardiovascular;  Laterality: N/A;  . SHOULDER ARTHROSCOPY    . TUBAL LIGATION       Current Meds  Medication Sig  . acetaminophen (TYLENOL) 500 MG tablet Take 1 tablet (500 mg total) by mouth every 6 (six) hours as needed for mild pain.  Marland Kitchen albuterol (PROAIR HFA) 108 (90 Base) MCG/ACT  inhaler Inhale 2 puffs into the lungs every 4-6 hours as needed for shortness of breath or wheezing  . albuterol (PROVENTIL) (2.5 MG/3ML) 0.083% nebulizer solution Take 3 mLs (2.5 mg total) by nebulization every 6 (six) hours as needed for wheezing or shortness of breath.  . Amino Acids-Protein Hydrolys (FEEDING SUPPLEMENT, PRO-STAT SUGAR FREE 64,) LIQD Take 30 mLs by mouth 2 (two) times daily.  Marland Kitchen arformoterol (BROVANA) 15 MCG/2ML NEBU Take 2 mLs (15 mcg total) by nebulization 2 (two) times daily.  Marland Kitchen aspirin EC 81 MG EC tablet Take 1 tablet (81 mg total) by mouth daily.  Marland Kitchen atorvastatin (LIPITOR) 80 MG tablet Take 80 mg by mouth every evening.   Marland Kitchen CALCIUM POLYCARBOPHIL PO Take 3 tablets by mouth daily.  . Cholecalciferol (VITAMIN D3) 2000 units capsule Take 2,000 Units by mouth daily.   . clopidogrel (PLAVIX) 75 MG tablet Take 1 tablet (75 mg  total) by mouth daily.  . DULoxetine (CYMBALTA) 60 MG capsule Take 120 mg by mouth daily.   Marland Kitchen EMGALITY 120 MG/ML SOAJ Take 120 mg by mouth every 30 (thirty) days.  Marland Kitchen ENTRESTO 24-26 MG Take 1 tablet by mouth 2 (two) times daily.  . furosemide (LASIX) 40 MG tablet Take 1 tablet (40 mg total) by mouth 2 (two) times daily. Make take one additional tablet for weight gain of 3 lbs overnight or 5 lbs in one week.  Marland Kitchen HYDROcodone-acetaminophen (NORCO/VICODIN) 5-325 MG tablet Take 1 tablet by mouth every 6 (six) hours as needed.  . hydrocortisone (CORTEF) 5 MG tablet Take 5-15 mg by mouth See admin instructions. Take 3 tablets in the morning and take 1 tablet in the evening  . insulin degludec (TRESIBA FLEXTOUCH) 100 UNIT/ML SOPN FlexTouch Pen Inject 50 Units into the skin at bedtime.   Marland Kitchen ipratropium-albuterol (DUONEB) 0.5-2.5 (3) MG/3ML SOLN Take 3 mLs by nebulization every 6 (six) hours as needed (SHORTNESS OF BREATH, WHEEZING).   . Iron-FA-B Cmp-C-Biot-Probiotic (FUSION PLUS) CAPS Take 1 capsule by mouth at bedtime.   Marland Kitchen levothyroxine (SYNTHROID, LEVOTHROID) 150 MCG  tablet Take 150 mcg by mouth daily before breakfast.  . LORazepam (ATIVAN) 0.5 MG tablet Take 1 tablet by mouth daily as needed.   Marland Kitchen MAGNESIUM-OXIDE 400 (241.3 Mg) MG tablet Take 400 mg by mouth daily.  . Melatonin 3 MG TABS Take 1 tablet (3 mg total) by mouth at bedtime as needed (insomnia).  . metolazone (ZAROXOLYN) 2.5 MG tablet Take 2.5 mg by mouth daily.   . nitroGLYCERIN (NITROSTAT) 0.4 MG SL tablet Place 1 tablet (0.4 mg total) under the tongue every 5 (five) minutes as needed for chest pain.  . OXYGEN Place 2 L into the nose continuous.   . polyethylene glycol (MIRALAX / GLYCOLAX) packet Take 17 g by mouth daily. (Patient taking differently: Take 17 g by mouth daily as needed for mild constipation. )  . potassium chloride SA (K-DUR) 20 MEQ tablet Take 40 mEq by mouth daily.  . pramipexole (MIRAPEX) 1 MG tablet Take 1 mg by mouth 2 (two) times a day.  . TRELEGY ELLIPTA 100-62.5-25 MCG/INH AEPB Inhale 1 puff into the lungs daily.  . TRULICITY 1.5 0000000 SOPN Inject 1.5 mg into the skin every Sunday.       Family History: The patient's family history includes Diabetes in her father, sister, sister, and son; Stroke in her mother. There is no history of Colon cancer or Esophageal cancer.   ROS:   Please see the history of present illness.     All other systems reviewed and are negative.   Labs/Other Tests and Data Reviewed:     Recent Labs: 10/18/2018: NT-Pro BNP 1,150 12/24/2018: ALT 23; B Natriuretic Peptide 157.2; TSH 4.025 12/28/2018: BUN 27; Creatinine, Ser 1.10; Hemoglobin 12.0; Magnesium 2.0; Platelets 195; Potassium 4.4; Sodium 141  Recent Lipid Panel    Component Value Date/Time   CHOL 190 11/22/2018 1104   TRIG 131 11/22/2018 1104   HDL 62 11/22/2018 1104   CHOLHDL 3.1 11/22/2018 1104   CHOLHDL 3.6 10/13/2016 0037   VLDL 14 10/13/2016 0037   LDLCALC 102 (H) 11/22/2018 1104   LDLDIRECT 95 12/20/2018 1018      Exam:    Vital Signs:  BP (!) 157/86   Pulse 91      Wt Readings from Last 3 Encounters:  12/28/18 239 lb 12.8 oz (108.8 kg)  12/20/18 237 lb (107.5 kg)  11/27/18  235 lb 0.2 oz (106.6 kg)     Well nourished, well developed in no acute distress. Alert awake and x3 talking to me over the video link.  She is happy to be able to talk to me not in any distress look like she gained some weight and may be simply her face is more puffy I suspect because of steroids.  Diagnosis for this visit:   1. Ischemic cardiomyopathy   2. Atherosclerosis of native coronary artery of native heart without angina pectoris   3. Essential hypertension   4. Other emphysema ([place])      ASSESSMENT & PLAN:    1.  Ischemic cardiomyopathy.  Sheri relocated to [place] we will not change any of her medications.  She does have already new cardiologist in [place] and she is scheduled to follow-up with him.  From my standpoint review I will recommend to do Chem-7 as well as proBNP.  I asked her to start taking nitroglycerin on as-needed basis and let us know or her new cardiologist know if she requires a lot of nitroglycerin.  If that is the case we probably need to augment her antianginal therapy. 2.  Ischemic cardiomyopathy with last estimation of ejection fraction done in August.  She is on Lasix.  Not on beta-blocker because of advanced COPD.  She is on Entresto and ideally we should be able to increase the dose of this medication but now I cannot check her kidney function.  That should be addressed by her new cardiologist. 3.  COPD obviously big problem she still continue to smoke again she was advised to quit.  Overall I told her if there is anything we can can help her remotely will be more than glad to do that and I wish her luck.  COVID-19 Education: The signs and symptoms of COVID-19 were discussed with the patient and how to seek care for testing (follow up with PCP or arrange E-visit).  The importance of social distancing was discussed today.  Patient Risk:    After full review of this patients clinical status, I feel that they are at least moderate risk at this time.  Time:   Today, I have spent 5 minutes with the patient with telehealth technology discussing pt health issues.  I spent 20 minutes reviewing her chart before the visit.  Visit was finished at 11:58 AM.    Medication Adjustments/Labs and Tests Ordered: Current medicines are reviewed at length with the patient today.  Concerns regarding medicines are outlined above.  No orders of the defined types were placed in this encounter.  Medication changes: No orders of the defined types were placed in this encounter.    Disposition: As needed, she relocated for go to [place] to live with her sister  Signed, Park Liter, MD, [facility] 03/01/2019 11:47 AM    [place]

## 2019-11-24 IMAGING — DX DG CHEST 1V PORT
1 series · 1 of 1 positions shown · non-contrast
Comparison: 11/17/2018

CLINICAL DATA: Respiratory distress with intubation

EXAM:
PORTABLE CHEST 1 VIEW

[chest ap]
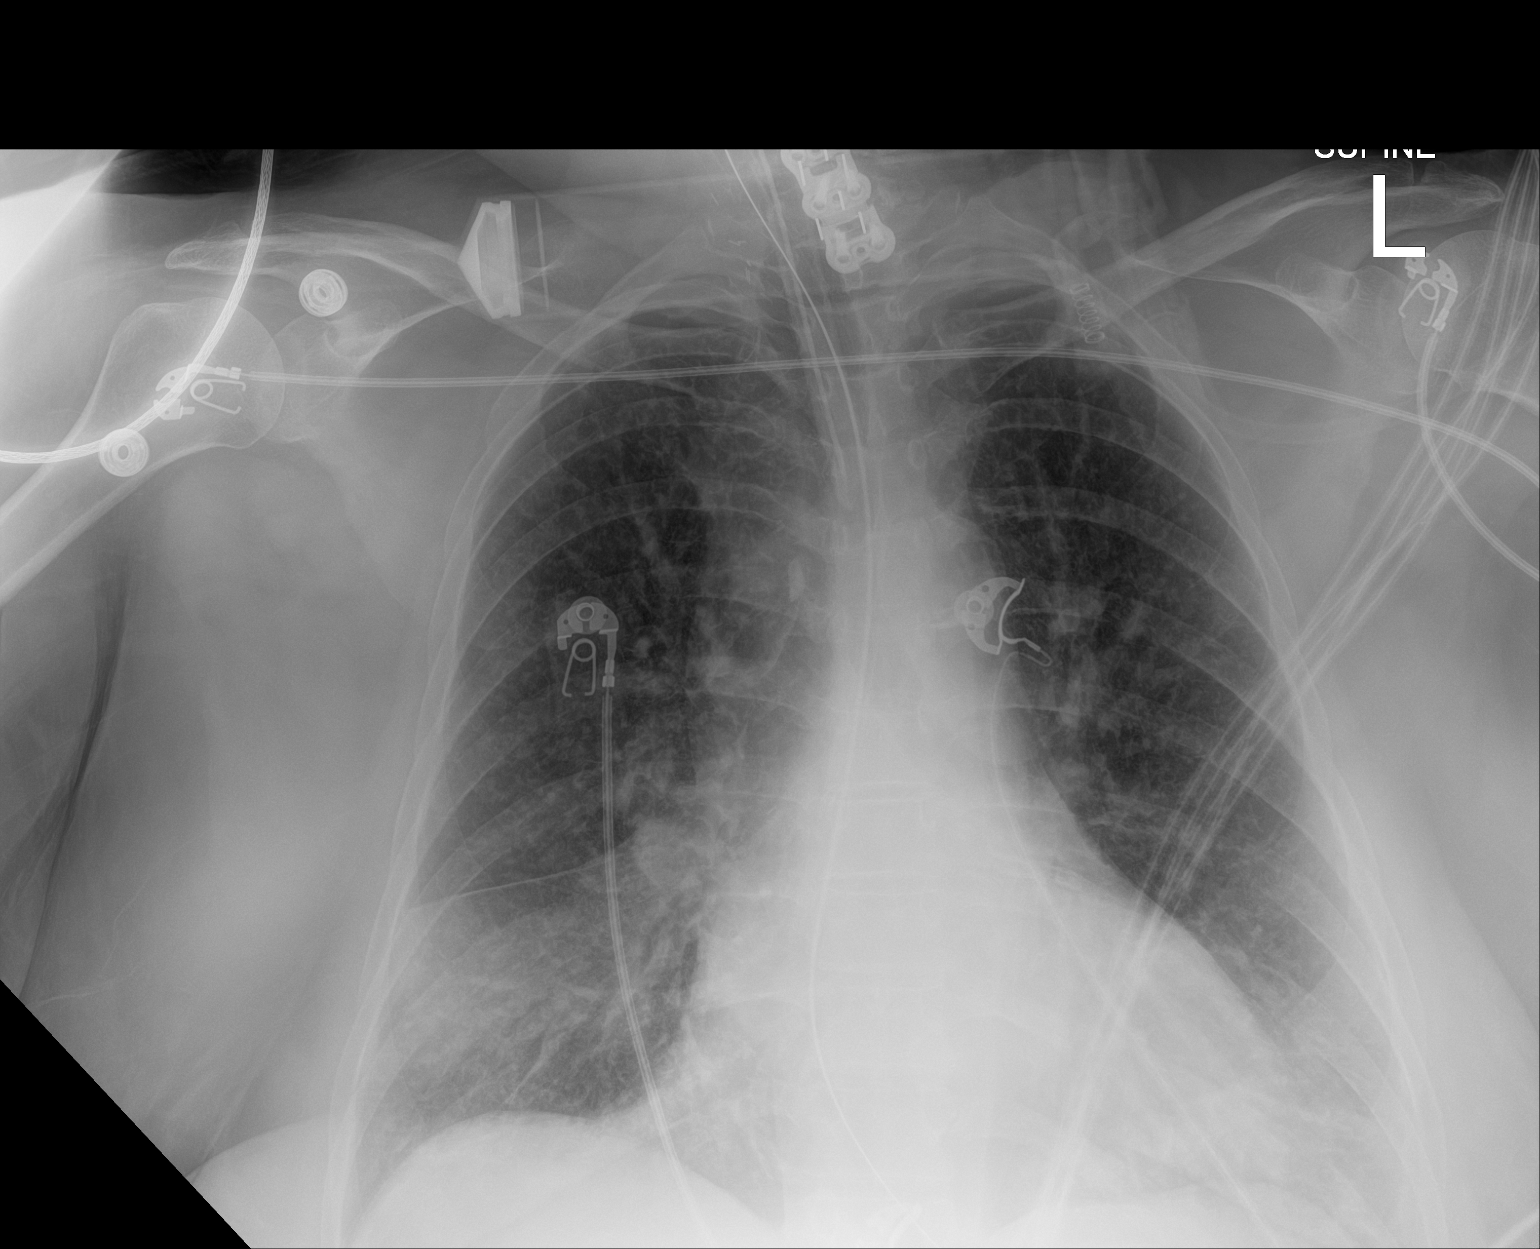

[1 of 1 positions shown; findings below may reference images not displayed]

FINDINGS: Endotracheal tube tip just below the clavicular heads. The
orogastric tube at least reaches the stomach.

Streaky density at both lung bases from atelectasis and or
bronchitis. The lung volumes are large. No edema, effusion, or
pneumothorax.
IMPRESSION: 1. Unremarkable hardware positioning.
2. Bronchitic/atelectatic markings at the bases.

## 2019-11-24 IMAGING — CT CT HEAD W/O CM
4 series · 17 of 47 positions shown, 19 images · non-contrast
Comparison: 02/20/2014

CLINICAL DATA: Altered level of consciousness

EXAM:
CT HEAD WITHOUT CONTRAST
TECHNIQUE: Contiguous axial images were obtained from the base of the skull
through the vertex without intravenous contrast.

[Series 3: head without · axial · non-contrast · 0.43mm/px · z∈[-126,+4]mm · 7 of 36 slices shown, 9 images]
[im 5/36  brain]
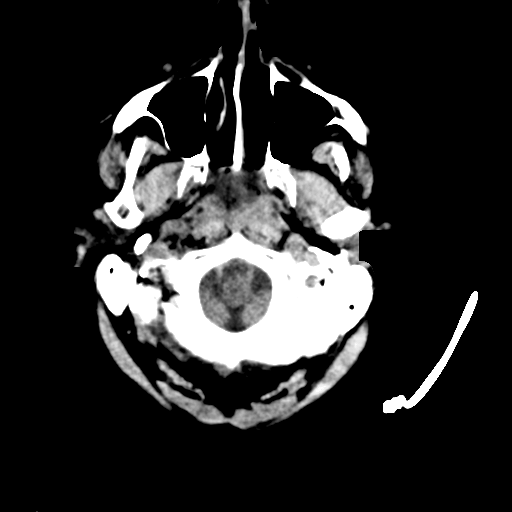
[im 5/36  bone]
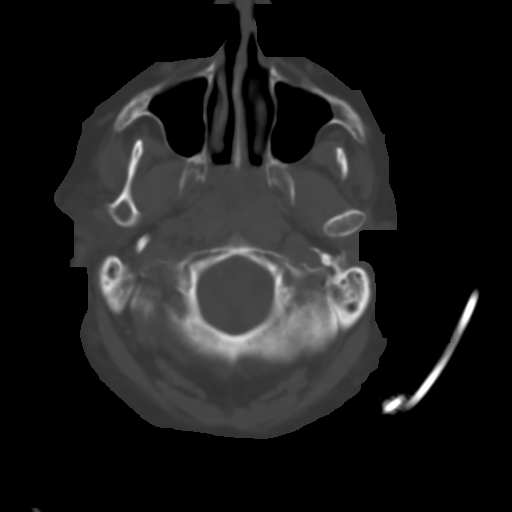
[im 9/36  brain]
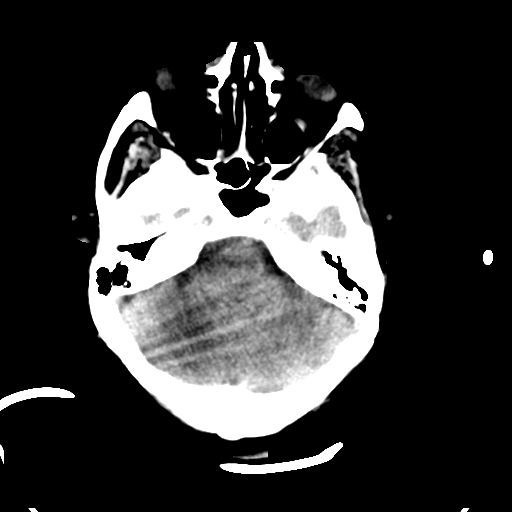
[im 14/36  brain]
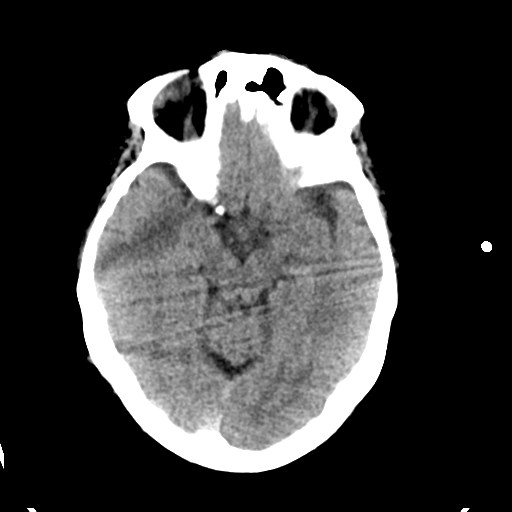
[im 18/36  brain]
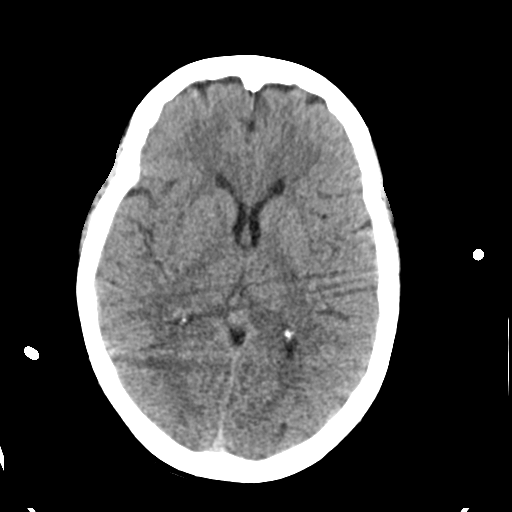
[im 22/36  brain]
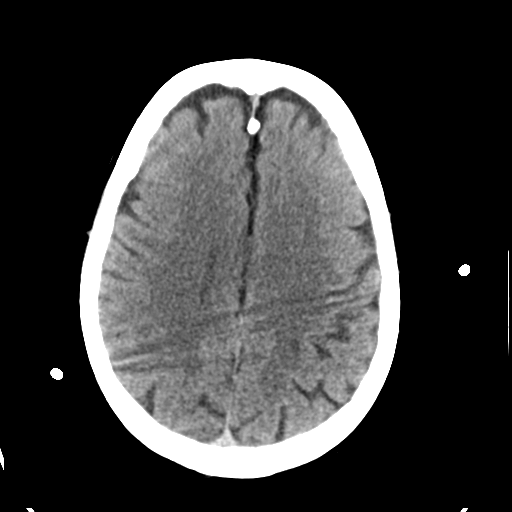
[im 22/36  bone]
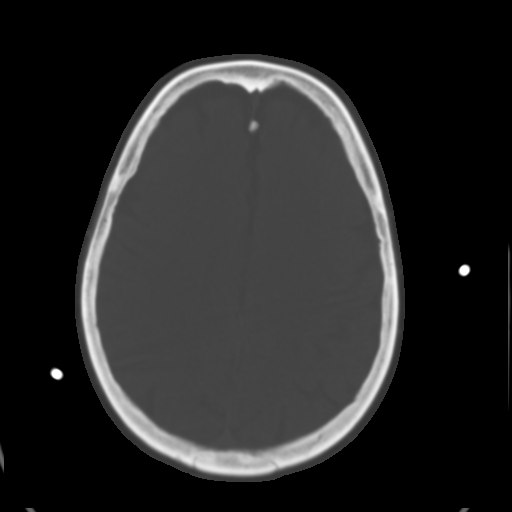
[im 27/36  brain]
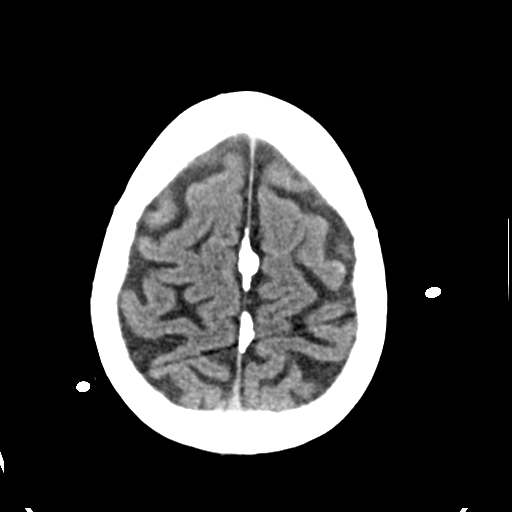
[im 31/36  brain]
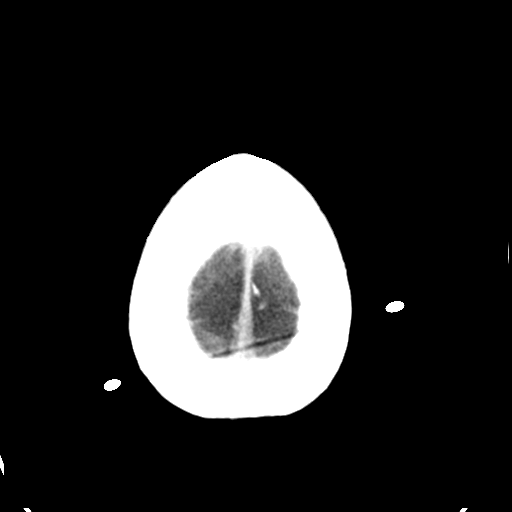

[Series 4: head bone · axial · 0.43mm/px · z∈[-130,-68]mm · 4 of 88 slices shown]
[im 9/88  bone]
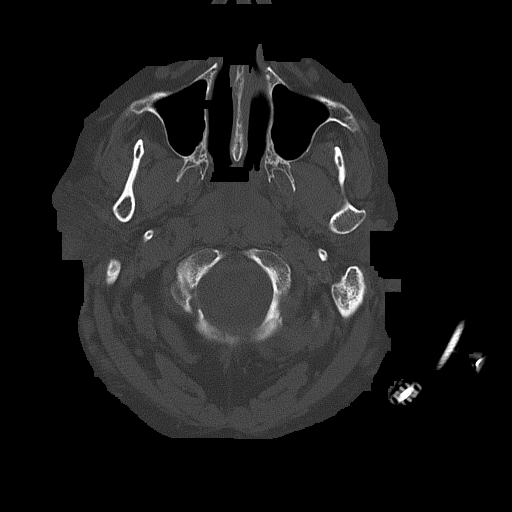
[im 18/88  bone]
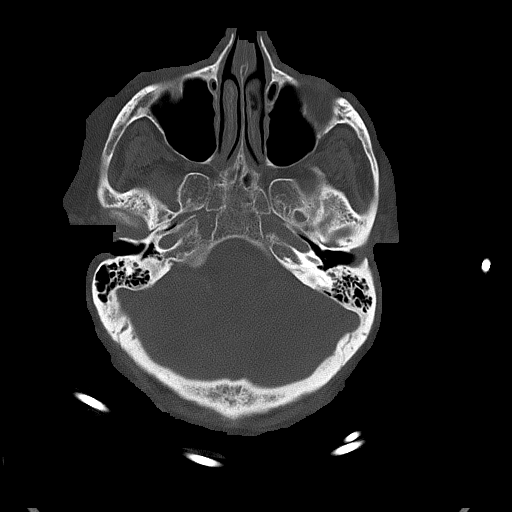
[im 27/88  bone]
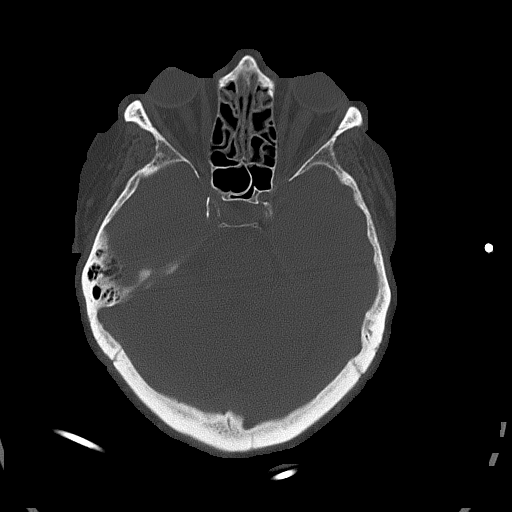
[im 40/88  bone]
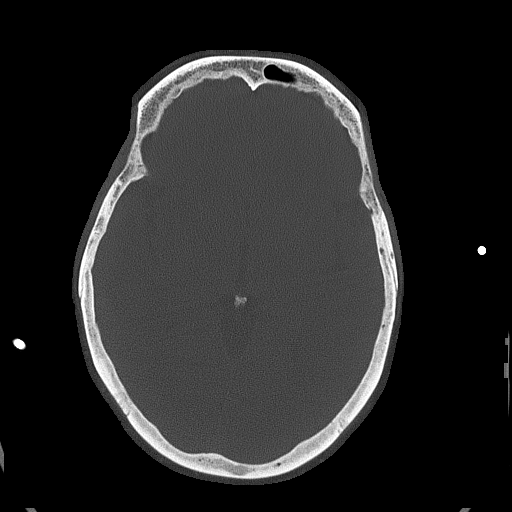

[Series 5: head without cor · coronal · non-contrast · 0.35mm/px · 3 of 67 slices shown]
[im 23/67  brain]
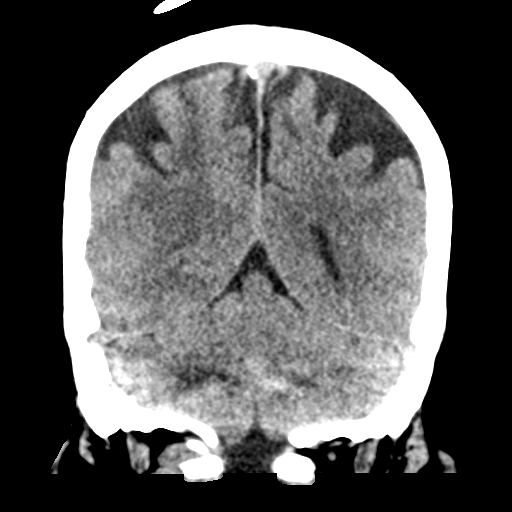
[im 30/67  brain]
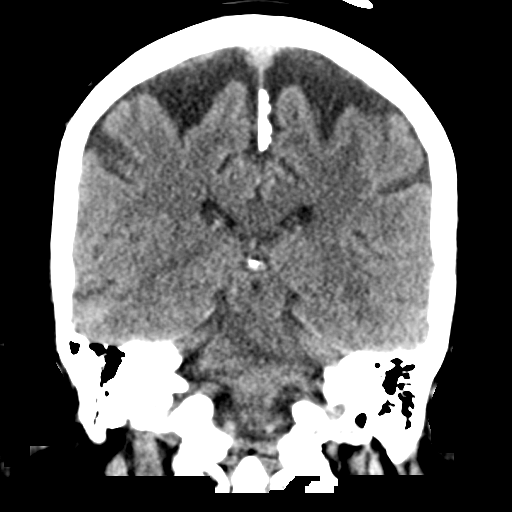
[im 37/67  brain]
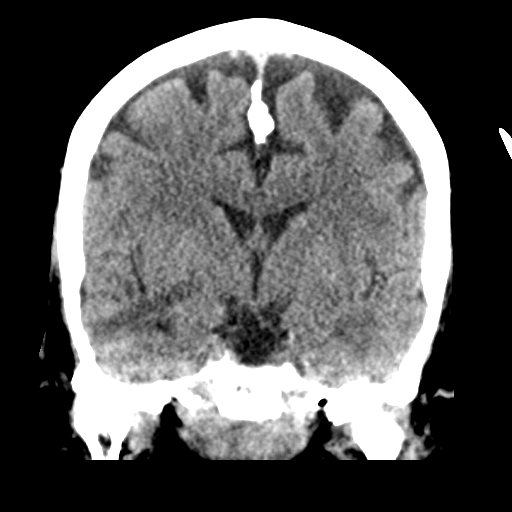

[Series 6: head without sag · sagittal · non-contrast · 0.34mm/px · 3 of 60 slices shown]
[im 20/60  brain]
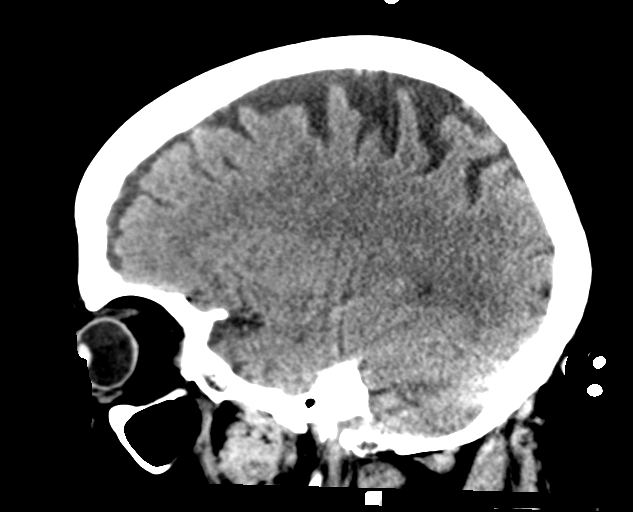
[im 30/60  brain]
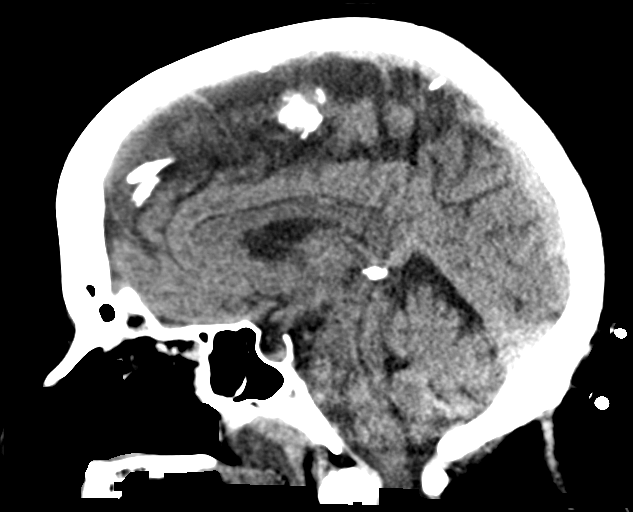
[im 40/60  brain]
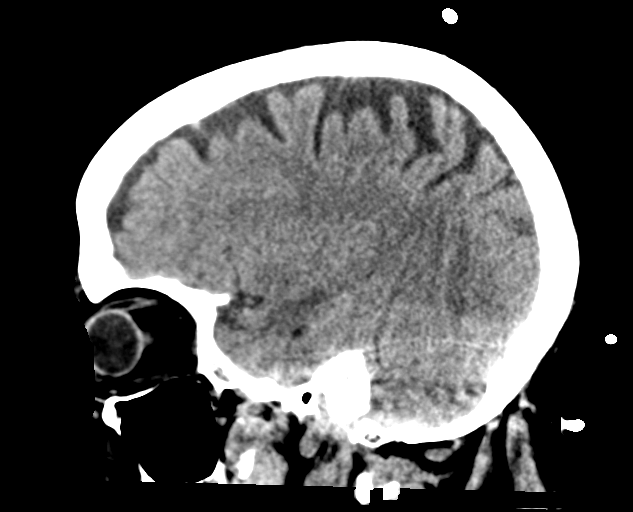

[17 of 47 positions shown; findings below may reference images not displayed]

FINDINGS: Brain: No evidence of acute infarction, hemorrhage, hydrocephalus,
extra-axial collection or mass lesion/mass effect.

Vascular: Atherosclerotic calcifications.

Skull: Normal. Negative for fracture or focal lesion.

Sinuses/Orbits: No acute finding.

Other: Streak artifact from wiring.
IMPRESSION: No acute finding.

## 2021-02-23 ENCOUNTER — Telehealth: Payer: Self-pay

## 2021-02-23 NOTE — Telephone Encounter (Signed)
I have called patient regarding recall colonoscopy and was unable to reach her. Patient also did not have a voicemail to leave a message.

## 2021-04-05 ENCOUNTER — Encounter: Payer: Self-pay | Admitting: Gastroenterology

## 2021-05-24 NOTE — Telephone Encounter (Signed)
Reminder to Schedule Colonoscopy Letter came back as return to sender 05-24-21. Envelope says that patient is deceased.
